# Patient Record
Sex: Female | Born: 1935 | Race: White | Hispanic: No | Smoking: Former smoker
Health system: Southern US, Community
[De-identification: ages and names within clinical notes are randomized; demographics above are authoritative.]

## PROBLEM LIST (undated history)

## (undated) DIAGNOSIS — C659 Malignant neoplasm of unspecified renal pelvis: Secondary | ICD-10-CM

## (undated) DIAGNOSIS — F3289 Other specified depressive episodes: Secondary | ICD-10-CM

## (undated) DIAGNOSIS — F411 Generalized anxiety disorder: Secondary | ICD-10-CM

## (undated) DIAGNOSIS — Z794 Long term (current) use of insulin: Secondary | ICD-10-CM

## (undated) DIAGNOSIS — M810 Age-related osteoporosis without current pathological fracture: Secondary | ICD-10-CM

## (undated) DIAGNOSIS — H3530 Unspecified macular degeneration: Secondary | ICD-10-CM

## (undated) DIAGNOSIS — K449 Diaphragmatic hernia without obstruction or gangrene: Secondary | ICD-10-CM

## (undated) DIAGNOSIS — I6990 Unspecified sequelae of unspecified cerebrovascular disease: Secondary | ICD-10-CM

## (undated) DIAGNOSIS — R269 Unspecified abnormalities of gait and mobility: Secondary | ICD-10-CM

## (undated) DIAGNOSIS — F329 Major depressive disorder, single episode, unspecified: Secondary | ICD-10-CM

## (undated) DIAGNOSIS — M1990 Unspecified osteoarthritis, unspecified site: Secondary | ICD-10-CM

## (undated) DIAGNOSIS — D649 Anemia, unspecified: Secondary | ICD-10-CM

## (undated) DIAGNOSIS — M316 Other giant cell arteritis: Secondary | ICD-10-CM

## (undated) DIAGNOSIS — N3020 Other chronic cystitis without hematuria: Secondary | ICD-10-CM

## (undated) DIAGNOSIS — F070 Personality change due to known physiological condition: Secondary | ICD-10-CM

## (undated) DIAGNOSIS — I509 Heart failure, unspecified: Secondary | ICD-10-CM

## (undated) DIAGNOSIS — D3000 Benign neoplasm of unspecified kidney: Secondary | ICD-10-CM

## (undated) DIAGNOSIS — B351 Tinea unguium: Secondary | ICD-10-CM

## (undated) DIAGNOSIS — K661 Hemoperitoneum: Secondary | ICD-10-CM

## (undated) DIAGNOSIS — B0229 Other postherpetic nervous system involvement: Secondary | ICD-10-CM

## (undated) DIAGNOSIS — I10 Essential (primary) hypertension: Secondary | ICD-10-CM

## (undated) DIAGNOSIS — IMO0002 Reserved for concepts with insufficient information to code with codable children: Secondary | ICD-10-CM

## (undated) DIAGNOSIS — E119 Type 2 diabetes mellitus without complications: Secondary | ICD-10-CM

## (undated) DIAGNOSIS — Z9889 Other specified postprocedural states: Secondary | ICD-10-CM

## (undated) DIAGNOSIS — IMO0001 Reserved for inherently not codable concepts without codable children: Secondary | ICD-10-CM

## (undated) DIAGNOSIS — D693 Immune thrombocytopenic purpura: Secondary | ICD-10-CM

## (undated) DIAGNOSIS — R350 Frequency of micturition: Secondary | ICD-10-CM

## (undated) DIAGNOSIS — J449 Chronic obstructive pulmonary disease, unspecified: Secondary | ICD-10-CM

## (undated) DIAGNOSIS — E1142 Type 2 diabetes mellitus with diabetic polyneuropathy: Secondary | ICD-10-CM

## (undated) DIAGNOSIS — E785 Hyperlipidemia, unspecified: Secondary | ICD-10-CM

## (undated) DIAGNOSIS — E1065 Type 1 diabetes mellitus with hyperglycemia: Secondary | ICD-10-CM

## (undated) DIAGNOSIS — R112 Nausea with vomiting, unspecified: Secondary | ICD-10-CM

## (undated) DIAGNOSIS — Z8673 Personal history of transient ischemic attack (TIA), and cerebral infarction without residual deficits: Secondary | ICD-10-CM

## (undated) DIAGNOSIS — D708 Other neutropenia: Secondary | ICD-10-CM

## (undated) HISTORY — DX: Frequency of micturition: R35.0

## (undated) HISTORY — DX: Generalized anxiety disorder: F41.1

## (undated) HISTORY — DX: Age-related osteoporosis without current pathological fracture: M81.0

## (undated) HISTORY — DX: Other postherpetic nervous system involvement: B02.29

## (undated) HISTORY — DX: Personality change due to known physiological condition: F07.0

## (undated) HISTORY — DX: Unspecified abnormalities of gait and mobility: R26.9

## (undated) HISTORY — DX: Tinea unguium: B35.1

## (undated) HISTORY — DX: Chronic obstructive pulmonary disease, unspecified: J44.9

## (undated) HISTORY — DX: Type 1 diabetes mellitus with hyperglycemia: E10.65

## (undated) HISTORY — DX: Anemia, unspecified: D64.9

## (undated) HISTORY — PX: TOTAL KNEE ARTHROPLASTY: SHX125

## (undated) HISTORY — DX: Hyperlipidemia, unspecified: E78.5

## (undated) HISTORY — DX: Essential (primary) hypertension: I10

## (undated) HISTORY — DX: Reserved for concepts with insufficient information to code with codable children: IMO0002

## (undated) HISTORY — DX: Major depressive disorder, single episode, unspecified: F32.9

## (undated) HISTORY — PX: TEMPORAL ARTERY BIOPSY / LIGATION: SUR132

## (undated) HISTORY — DX: Immune thrombocytopenic purpura: D69.3

## (undated) HISTORY — DX: Malignant neoplasm of unspecified renal pelvis: C65.9

## (undated) HISTORY — DX: Hemoperitoneum: K66.1

## (undated) HISTORY — PX: ELBOW SURGERY: SHX618

## (undated) HISTORY — DX: Unspecified sequelae of unspecified cerebrovascular disease: I69.90

## (undated) HISTORY — DX: Heart failure, unspecified: I50.9

## (undated) HISTORY — DX: Diaphragmatic hernia without obstruction or gangrene: K44.9

## (undated) HISTORY — DX: Other neutropenia: D70.8

## (undated) HISTORY — PX: CARPAL TUNNEL RELEASE: SHX101

## (undated) HISTORY — DX: Other specified depressive episodes: F32.89

---

## 1990-12-06 HISTORY — PX: CATARACT EXTRACTION W/ INTRAOCULAR LENS  IMPLANT, BILATERAL: SHX1307

## 1991-03-08 HISTORY — PX: CATARACT EXTRACTION W/ INTRAOCULAR LENS IMPLANT: SHX1309

## 1997-06-10 ENCOUNTER — Ambulatory Visit (HOSPITAL_COMMUNITY): Admission: RE | Admit: 1997-06-10 | Discharge: 1997-06-10 | Payer: Self-pay | Admitting: Neurosurgery

## 1997-07-29 ENCOUNTER — Ambulatory Visit (HOSPITAL_COMMUNITY): Admission: RE | Admit: 1997-07-29 | Discharge: 1997-07-29 | Payer: Self-pay | Admitting: Neurosurgery

## 1997-08-12 ENCOUNTER — Ambulatory Visit (HOSPITAL_COMMUNITY): Admission: RE | Admit: 1997-08-12 | Discharge: 1997-08-13 | Payer: Self-pay | Admitting: Neurosurgery

## 1997-08-26 ENCOUNTER — Ambulatory Visit (HOSPITAL_COMMUNITY): Admission: RE | Admit: 1997-08-26 | Discharge: 1997-08-26 | Payer: Self-pay | Admitting: Neurosurgery

## 1997-11-05 ENCOUNTER — Emergency Department (HOSPITAL_COMMUNITY): Admission: EM | Admit: 1997-11-05 | Discharge: 1997-11-05 | Payer: Self-pay

## 1999-02-17 ENCOUNTER — Other Ambulatory Visit: Admission: RE | Admit: 1999-02-17 | Discharge: 1999-02-17 | Payer: Self-pay | Admitting: Internal Medicine

## 2000-08-29 ENCOUNTER — Encounter (INDEPENDENT_AMBULATORY_CARE_PROVIDER_SITE_OTHER): Payer: Self-pay | Admitting: Specialist

## 2000-08-29 ENCOUNTER — Encounter: Payer: Self-pay | Admitting: Emergency Medicine

## 2000-08-29 ENCOUNTER — Inpatient Hospital Stay (HOSPITAL_COMMUNITY): Admission: EM | Admit: 2000-08-29 | Discharge: 2000-09-08 | Payer: Self-pay | Admitting: Emergency Medicine

## 2003-05-16 ENCOUNTER — Ambulatory Visit (HOSPITAL_COMMUNITY): Admission: RE | Admit: 2003-05-16 | Discharge: 2003-05-16 | Payer: Self-pay | Admitting: Orthopaedic Surgery

## 2003-06-18 ENCOUNTER — Ambulatory Visit (HOSPITAL_COMMUNITY): Admission: RE | Admit: 2003-06-18 | Discharge: 2003-06-19 | Payer: Self-pay | Admitting: Orthopaedic Surgery

## 2003-06-18 HISTORY — PX: OTHER SURGICAL HISTORY: SHX169

## 2003-11-19 ENCOUNTER — Observation Stay (HOSPITAL_COMMUNITY): Admission: RE | Admit: 2003-11-19 | Discharge: 2003-11-21 | Payer: Self-pay | Admitting: Orthopaedic Surgery

## 2003-11-19 HISTORY — PX: LUMBAR LAMINECTOMY: SHX95

## 2004-01-23 ENCOUNTER — Inpatient Hospital Stay (HOSPITAL_COMMUNITY): Admission: RE | Admit: 2004-01-23 | Discharge: 2004-01-26 | Payer: Self-pay | Admitting: Orthopaedic Surgery

## 2004-03-09 ENCOUNTER — Ambulatory Visit: Payer: Self-pay | Admitting: Oncology

## 2004-08-12 LAB — HM DEXA SCAN

## 2005-01-22 HISTORY — PX: TOTAL HIP ARTHROPLASTY: SHX124

## 2005-03-10 ENCOUNTER — Ambulatory Visit: Payer: Self-pay | Admitting: Oncology

## 2005-10-10 ENCOUNTER — Ambulatory Visit (HOSPITAL_COMMUNITY): Admission: RE | Admit: 2005-10-10 | Discharge: 2005-10-10 | Payer: Self-pay | Admitting: Orthopaedic Surgery

## 2005-10-12 ENCOUNTER — Ambulatory Visit (HOSPITAL_COMMUNITY): Admission: RE | Admit: 2005-10-12 | Discharge: 2005-10-13 | Payer: Self-pay | Admitting: Orthopaedic Surgery

## 2005-10-12 HISTORY — PX: ANTERIOR CERVICAL DECOMP/DISCECTOMY FUSION: SHX1161

## 2006-02-06 ENCOUNTER — Encounter: Admission: RE | Admit: 2006-02-06 | Discharge: 2006-02-06 | Payer: Self-pay | Admitting: Orthopaedic Surgery

## 2006-02-10 HISTORY — PX: POSTERIOR FUSION CERVICAL SPINE: SUR628

## 2006-02-20 ENCOUNTER — Ambulatory Visit (HOSPITAL_COMMUNITY): Admission: RE | Admit: 2006-02-20 | Discharge: 2006-02-21 | Payer: Self-pay | Admitting: Orthopaedic Surgery

## 2006-03-06 ENCOUNTER — Ambulatory Visit: Payer: Self-pay | Admitting: Oncology

## 2006-04-04 LAB — MORPHOLOGY: PLT EST: ADEQUATE

## 2006-04-04 LAB — CBC WITH DIFFERENTIAL/PLATELET
BASO%: 0.7 % (ref 0.0–2.0)
Basophils Absolute: 0.1 10*3/uL (ref 0.0–0.1)
EOS%: 3.5 % (ref 0.0–7.0)
HGB: 12.2 g/dL (ref 11.6–15.9)
MCH: 32.3 pg (ref 26.0–34.0)
MCHC: 32.5 g/dL (ref 32.0–36.0)
MONO#: 0.7 10*3/uL (ref 0.1–0.9)
RDW: 12.2 % (ref 11.3–14.5)
WBC: 8.8 10*3/uL (ref 3.9–10.0)
lymph#: 2.1 10*3/uL (ref 0.9–3.3)

## 2006-06-20 ENCOUNTER — Emergency Department (HOSPITAL_COMMUNITY): Admission: EM | Admit: 2006-06-20 | Discharge: 2006-06-20 | Payer: Self-pay | Admitting: *Deleted

## 2006-06-22 ENCOUNTER — Observation Stay (HOSPITAL_COMMUNITY): Admission: EM | Admit: 2006-06-22 | Discharge: 2006-06-24 | Payer: Self-pay | Admitting: *Deleted

## 2007-08-08 ENCOUNTER — Inpatient Hospital Stay (HOSPITAL_COMMUNITY): Admission: RE | Admit: 2007-08-08 | Discharge: 2007-08-13 | Payer: Self-pay | Admitting: Orthopaedic Surgery

## 2007-08-13 ENCOUNTER — Ambulatory Visit: Payer: Self-pay | Admitting: Surgery

## 2007-08-13 ENCOUNTER — Encounter (INDEPENDENT_AMBULATORY_CARE_PROVIDER_SITE_OTHER): Payer: Self-pay | Admitting: Orthopaedic Surgery

## 2007-12-27 ENCOUNTER — Encounter: Admission: RE | Admit: 2007-12-27 | Discharge: 2007-12-27 | Payer: Self-pay | Admitting: Orthopaedic Surgery

## 2008-07-12 ENCOUNTER — Inpatient Hospital Stay (HOSPITAL_COMMUNITY): Admission: EM | Admit: 2008-07-12 | Discharge: 2008-07-15 | Payer: Self-pay | Admitting: Emergency Medicine

## 2008-07-12 ENCOUNTER — Ambulatory Visit: Payer: Self-pay | Admitting: Internal Medicine

## 2008-07-15 ENCOUNTER — Encounter (INDEPENDENT_AMBULATORY_CARE_PROVIDER_SITE_OTHER): Payer: Self-pay | Admitting: Internal Medicine

## 2009-01-24 ENCOUNTER — Encounter (INDEPENDENT_AMBULATORY_CARE_PROVIDER_SITE_OTHER): Payer: Self-pay | Admitting: Emergency Medicine

## 2009-01-24 ENCOUNTER — Emergency Department (HOSPITAL_COMMUNITY): Admission: EM | Admit: 2009-01-24 | Discharge: 2009-01-24 | Payer: Self-pay | Admitting: Emergency Medicine

## 2009-01-24 ENCOUNTER — Ambulatory Visit: Payer: Self-pay | Admitting: Vascular Surgery

## 2009-02-12 ENCOUNTER — Encounter: Admission: RE | Admit: 2009-02-12 | Discharge: 2009-02-12 | Payer: Self-pay | Admitting: Internal Medicine

## 2009-04-21 ENCOUNTER — Emergency Department (HOSPITAL_COMMUNITY): Admission: EM | Admit: 2009-04-21 | Discharge: 2009-04-21 | Payer: Self-pay | Admitting: Emergency Medicine

## 2009-05-06 ENCOUNTER — Inpatient Hospital Stay (HOSPITAL_COMMUNITY): Admission: RE | Admit: 2009-05-06 | Discharge: 2009-05-08 | Payer: Self-pay | Admitting: Orthopaedic Surgery

## 2010-03-28 ENCOUNTER — Encounter: Payer: Self-pay | Admitting: Internal Medicine

## 2010-05-26 LAB — URINE MICROSCOPIC-ADD ON

## 2010-05-26 LAB — PROTIME-INR: Prothrombin Time: 13.3 seconds (ref 11.6–15.2)

## 2010-05-26 LAB — URINALYSIS, ROUTINE W REFLEX MICROSCOPIC
Bilirubin Urine: NEGATIVE
Hgb urine dipstick: NEGATIVE
Hgb urine dipstick: NEGATIVE
Nitrite: POSITIVE — AB
Protein, ur: NEGATIVE mg/dL
Specific Gravity, Urine: 1.006 (ref 1.005–1.030)
Specific Gravity, Urine: 1.021 (ref 1.005–1.030)
Urobilinogen, UA: 0.2 mg/dL (ref 0.0–1.0)

## 2010-05-26 LAB — CBC
HCT: 34.4 % — ABNORMAL LOW (ref 36.0–46.0)
MCHC: 33.3 g/dL (ref 30.0–36.0)
MCV: 93.4 fL (ref 78.0–100.0)
Platelets: 216 10*3/uL (ref 150–400)
RDW: 14.8 % (ref 11.5–15.5)
WBC: 6.3 10*3/uL (ref 4.0–10.5)

## 2010-05-26 LAB — COMPREHENSIVE METABOLIC PANEL
ALT: 31 U/L (ref 0–35)
AST: 35 U/L (ref 0–37)
Albumin: 3.5 g/dL (ref 3.5–5.2)
BUN: 17 mg/dL (ref 6–23)
CO2: 26 mEq/L (ref 19–32)
Calcium: 10.3 mg/dL (ref 8.4–10.5)
Chloride: 102 mEq/L (ref 96–112)
Creatinine, Ser: 0.75 mg/dL (ref 0.4–1.2)
Creatinine, Ser: 0.83 mg/dL (ref 0.4–1.2)
GFR calc Af Amer: >60 mL/min (ref >60)
GFR calc non Af Amer: >60 mL/min (ref >60)
Glucose, Bld: 194 mg/dL — ABNORMAL HIGH (ref 70–99)
Glucose, Bld: 194 mg/dL — ABNORMAL HIGH (ref 70–99)
Sodium: 137 mEq/L (ref 135–145)
Total Bilirubin: 0.9 mg/dL (ref 0.3–1.2)
Total Protein: 6.8 g/dL (ref 6.0–8.3)

## 2010-05-26 LAB — POCT CARDIAC MARKERS
CKMB, poc: 1.1 ng/mL (ref 1.0–8.0)
Myoglobin, poc: 98.5 ng/mL (ref 12–200)

## 2010-05-26 LAB — DIFFERENTIAL
Basophils Absolute: 0 10*3/uL (ref 0.0–0.1)
Lymphocytes Relative: 21 % (ref 12–46)
Neutro Abs: 4.3 10*3/uL (ref 1.7–7.7)
Neutrophils Relative %: 68 % (ref 43–77)

## 2010-05-26 LAB — LIPASE, BLOOD: Lipase: 17 U/L (ref 11–59)

## 2010-05-30 LAB — BASIC METABOLIC PANEL
BUN: 12 mg/dL (ref 6–23)
BUN: 16 mg/dL (ref 6–23)
Chloride: 102 mEq/L (ref 96–112)
Chloride: 99 mEq/L (ref 96–112)
Creatinine, Ser: 0.69 mg/dL (ref 0.4–1.2)
Glucose, Bld: 185 mg/dL — ABNORMAL HIGH (ref 70–99)
Glucose, Bld: 211 mg/dL — ABNORMAL HIGH (ref 70–99)
Potassium: 3.5 mEq/L (ref 3.5–5.1)
Potassium: 4.6 mEq/L (ref 3.5–5.1)

## 2010-05-30 LAB — CBC
HCT: 26 % — ABNORMAL LOW (ref 36.0–46.0)
HCT: 26.8 % — ABNORMAL LOW (ref 36.0–46.0)
MCHC: 33.3 g/dL (ref 30.0–36.0)
MCHC: 33.5 g/dL (ref 30.0–36.0)
MCV: 94.6 fL (ref 78.0–100.0)
MCV: 94.7 fL (ref 78.0–100.0)
Platelets: 140 10*3/uL — ABNORMAL LOW (ref 150–400)
Platelets: 150 10*3/uL (ref 150–400)
RDW: 14.9 % (ref 11.5–15.5)
RDW: 15.5 % (ref 11.5–15.5)

## 2010-05-30 LAB — PROTIME-INR: Prothrombin Time: 17.7 seconds — ABNORMAL HIGH (ref 11.6–15.2)

## 2010-05-30 LAB — GLUCOSE, CAPILLARY
Glucose-Capillary: 134 mg/dL — ABNORMAL HIGH (ref 70–99)
Glucose-Capillary: 213 mg/dL — ABNORMAL HIGH (ref 70–99)
Glucose-Capillary: 217 mg/dL — ABNORMAL HIGH (ref 70–99)
Glucose-Capillary: 98 mg/dL (ref 70–99)

## 2010-06-15 LAB — HEMOGLOBIN A1C
Hgb A1c MFr Bld: 6.7 % — ABNORMAL HIGH (ref 4.6–6.1)
Mean Plasma Glucose: 146 mg/dL

## 2010-06-15 LAB — DIFFERENTIAL
Basophils Absolute: 0.2 10*3/uL — ABNORMAL HIGH (ref 0.0–0.1)
Basophils Relative: 2 % — ABNORMAL HIGH (ref 0–1)
Eosinophils Absolute: 0.3 K/uL (ref 0.0–0.7)
Eosinophils Relative: 3 % (ref 0–5)
Lymphocytes Relative: 23 % (ref 12–46)
Lymphs Abs: 1.8 K/uL (ref 0.7–4.0)
Monocytes Absolute: 0.7 10*3/uL (ref 0.1–1.0)
Monocytes Relative: 9 % (ref 3–12)
Neutro Abs: 4.9 10*3/uL (ref 1.7–7.7)
Neutrophils Relative %: 63 % (ref 43–77)

## 2010-06-15 LAB — URINALYSIS, ROUTINE W REFLEX MICROSCOPIC
Bilirubin Urine: NEGATIVE
Glucose, UA: NEGATIVE mg/dL
Hgb urine dipstick: NEGATIVE
Ketones, ur: NEGATIVE mg/dL
Nitrite: NEGATIVE
Protein, ur: NEGATIVE mg/dL
Specific Gravity, Urine: 1.012 (ref 1.005–1.030)
Urobilinogen, UA: 1 mg/dL (ref 0.0–1.0)
pH: 6 (ref 5.0–8.0)

## 2010-06-15 LAB — GLUCOSE, CAPILLARY
Glucose-Capillary: 111 mg/dL — ABNORMAL HIGH (ref 70–99)
Glucose-Capillary: 149 mg/dL — ABNORMAL HIGH (ref 70–99)
Glucose-Capillary: 175 mg/dL — ABNORMAL HIGH (ref 70–99)
Glucose-Capillary: 211 mg/dL — ABNORMAL HIGH (ref 70–99)
Glucose-Capillary: 229 mg/dL — ABNORMAL HIGH (ref 70–99)
Glucose-Capillary: 271 mg/dL — ABNORMAL HIGH (ref 70–99)

## 2010-06-15 LAB — COMPREHENSIVE METABOLIC PANEL WITH GFR
ALT: 21 U/L (ref 0–35)
AST: 29 U/L (ref 0–37)
Albumin: 3.5 g/dL (ref 3.5–5.2)
BUN: 35 mg/dL — ABNORMAL HIGH (ref 6–23)
Chloride: 105 meq/L (ref 96–112)
Creatinine, Ser: 1.19 mg/dL (ref 0.4–1.2)
GFR calc non Af Amer: 44 mL/min — ABNORMAL LOW (ref >60)
Glucose, Bld: 194 mg/dL — ABNORMAL HIGH (ref 70–99)
Total Protein: 6.8 g/dL (ref 6.0–8.3)

## 2010-06-15 LAB — CBC
HCT: 37.2 % (ref 36.0–46.0)
Hemoglobin: 12.8 g/dL (ref 12.0–15.0)
MCHC: 34.1 g/dL (ref 30.0–36.0)
MCHC: 34.5 g/dL (ref 30.0–36.0)
MCV: 94 fL (ref 78.0–100.0)
MCV: 94.5 fL (ref 78.0–100.0)
Platelets: 214 10*3/uL (ref 150–400)
Platelets: 265 K/uL (ref 150–400)
RBC: 3.93 MIL/uL (ref 3.87–5.11)
RDW: 15.6 % — ABNORMAL HIGH (ref 11.5–15.5)
RDW: 15.7 % — ABNORMAL HIGH (ref 11.5–15.5)
WBC: 7.8 10*3/uL (ref 4.0–10.5)

## 2010-06-15 LAB — BASIC METABOLIC PANEL
BUN: 21 mg/dL (ref 6–23)
BUN: 24 mg/dL — ABNORMAL HIGH (ref 6–23)
BUN: 33 mg/dL — ABNORMAL HIGH (ref 6–23)
CO2: 25 mEq/L (ref 19–32)
Calcium: 8.5 mg/dL (ref 8.4–10.5)
Calcium: 9 mg/dL (ref 8.4–10.5)
Chloride: 106 mEq/L (ref 96–112)
Chloride: 113 mEq/L — ABNORMAL HIGH (ref 96–112)
Creatinine, Ser: 0.73 mg/dL (ref 0.4–1.2)
Creatinine, Ser: 0.76 mg/dL (ref 0.4–1.2)
Creatinine, Ser: 0.87 mg/dL (ref 0.4–1.2)
GFR calc Af Amer: >60 mL/min (ref >60)
GFR calc non Af Amer: >60 mL/min (ref >60)
Glucose, Bld: 222 mg/dL — ABNORMAL HIGH (ref 70–99)
Glucose, Bld: 229 mg/dL — ABNORMAL HIGH (ref 70–99)
Potassium: 3.9 mEq/L (ref 3.5–5.1)

## 2010-06-15 LAB — COMPREHENSIVE METABOLIC PANEL
Alkaline Phosphatase: 80 U/L (ref 39–117)
CO2: 25 mEq/L (ref 19–32)
Calcium: 9.4 mg/dL (ref 8.4–10.5)
GFR calc Af Amer: 53 mL/min — ABNORMAL LOW (ref >60)
Potassium: 5 mEq/L (ref 3.5–5.1)
Sodium: 137 mEq/L (ref 135–145)
Total Bilirubin: 0.6 mg/dL (ref 0.3–1.2)

## 2010-06-15 LAB — CORTISOL: Cortisol, Plasma: 10.8 ug/dL

## 2010-06-15 LAB — CLOSTRIDIUM DIFFICILE EIA: C difficile Toxins A+B, EIA: NEGATIVE

## 2010-06-15 LAB — SEDIMENTATION RATE: Sed Rate: 45 mm/hr — ABNORMAL HIGH (ref 0–22)

## 2010-06-15 LAB — OVA AND PARASITE EXAMINATION

## 2010-06-15 LAB — TROPONIN I: Troponin I: 0.02 ng/mL (ref 0.00–0.06)

## 2010-06-15 LAB — STOOL CULTURE

## 2010-06-15 LAB — BRAIN NATRIURETIC PEPTIDE: Pro B Natriuretic peptide (BNP): <30 pg/mL (ref 0.0–100.0)

## 2010-07-20 NOTE — Op Note (Signed)
NAMEADLER, ALTON               ACCOUNT NO.:  [id number]   MEDICAL RECORD NO.:  [id number]          PATIENT TYPE:  INP   LOCATION:  5009                         FACILITY:  [facility]   PHYSICIAN:  [name]. Ophelia Charter, M.D.    DATE OF BIRTH:  [birth year]   DATE OF PROCEDURE:  08/08/2007  DATE OF DISCHARGE:                               OPERATIVE REPORT   PREOPERATIVE DIAGNOSIS:  Left knee osteoarthritis.   POSTOPERATIVE DIAGNOSIS:  Left knee osteoarthritis.   PROCEDURE:  Left total knee arthroplasty.  Cemented, computer assisted   SURGEON:  [name]. Ophelia Charter, MD   ASSISTANT:  Wende Neighbors, PA-C   ANESTHESIA:  GOT, Marcaine skin local.   TOURNIQUET TIME:  1 hour.   DRAINS:  None.   COMPONENTS USED:  DePuy J and J rotating platform, PFC #3 femur with  plugs, 12.5 bearing with post, #3 cemented tibial tray, and 35-mm  patella.   PROCEDURE:  After induction of general anesthesia via orotracheal  intubation, the patient was prepped and draped with the proximal thigh  tourniquet after Foley catheter was placed.  Used impervious  stockinette, Coban, split sheets, drapes, sterile skin marker, and  Betadine drape x2 was used to seal the skin.   Time-out checklist was completed.  Leg was wrapped in Esmarch,  tourniquet inflated, and a midline incision was made.  Medial  retinacular incision was made splitting the quad tendon between the  medial and lateral two thirds.  Patella was flipped over and cut from  facet, removing 9.5-10 mm of bone.  Computer initialization was  performed.  After resection of marginal osteophytes, there was severe  tricompartmental degenerative changes with grade 4 chondromalacia  changes.  Computer initiation was performed with generation of the  femoral and tibial model size #3.  The patient had a 5-degree flexion  contracture and was in no significant varus-valgus malalignment.  A 10  mm were removed off the femoral side.  Chamfer cuts and box cuts were  made.   A #3 trial sizer is appropriate for the tibia and 9-10 mm removed  off the tibia.  Keel cuts were made, trials were tried, and 12.5 gave  better collateral balance in extension and prevented hyperextension.  After pulsatile lavage, tibia was cemented followed by femoral component  and then patella.  A 12.5 poly was inserted and 35-mm patella are  cemented.  All excessive cement had been removed.  After cement was  hard, tourniquet was deflated, and hemostasis obtained.  Layer closure  #1 Tycron  in the deep fascia, 2-0 Vicryl subcutaneous tissue and subcuticular skin  closure.  Marcaine infiltration, Steri-Strips, postop dressing, and knee  immobilizer.  Subcu reapproximation for the tibial bicortical pin was  used for computer initiation.  Steri-Strip application.  Transferred to  the recovery room in stable condition.      [name]. Ophelia Charter, M.D.  Electronically Signed     MCY/MEDQ  D:  08/08/2007  T:  08/09/2007  Job:  [id number]

## 2010-07-20 NOTE — H&P (Signed)
[name], [name]               ACCOUNT NO.:  [id number]   MEDICAL RECORD NO.:  [id number]          PATIENT TYPE:  EMS   LOCATION:  ED                           FACILITY:  [facility]   PHYSICIAN:  Pedro Earls, MD     DATE OF BIRTH:  April 19, 1924   DATE OF ADMISSION:  07/12/2008  DATE OF DISCHARGE:                              HISTORY & PHYSICAL   PRIMARY CARE PHYSICIAN:  Dr. Chilton Si.   CHIEF COMPLAINT:  Weakness and fall.   HISTORY OF PRESENT ILLNESS:  This is an 77 year old white female patient  who lives at home with his son who was brought in after an episode of  weakness and fall.  According to the patient, she had been having  weakness for almost the past 6 months, when she wakes up with the severe  pain and weakness in her bilateral upper extremities, which are swollen  and weak, up to the point that more recently 2 weeks ago the patient had  fallen and since then patient's condition had deteriorated to the extent  that she has not been able to feed herself and has not been eating for  the past few days.  The patient also had seen Dr. Hyacinth Meeker a couple days  ago when her some of her medications were stopped. Since then patient's  condition apparently got deteriorated and the patient had come to the ER  for further evaluation and management.   REVIEW OF SYSTEMS:  As above.  Rest of review of systems are negative.   PAST MEDICAL HISTORY:  1. Hypertension.  2. Degenerative joint disease.  3. Polymyalgia rheumatic.  4. Depression.  5. Diabetes type 2.  6. Osteoporosis.   PAST SURGICAL HISTORY:  1. Back surgery.  2. Carpal tunnel release.  3. Hip replacement knee replacement.   SOCIAL HISTORY:  Nonsmoker, nonalcoholic.  No IV drug abuse.  Lives at  home with her son.   FAMILY HISTORY:  Noncontributory.   ALLERGIES:  CODEINE, MORPHINE.   CURRENT MEDICATIONS:  1. Actonel 1 tablet q. Monthly.  2. Calcium carbonate 60 mg p.o. b.i.d.  3. Enalapril 10 mg daily.  4.  Multivitamin 1 tablet daily.  5. Prednisone 4 mg daily.  6. Gabapentin 10 mg t.i.d.  7. Acyclovir 20 grams daily.  8. Lorazepam 0.5 mg 1/2 tablet at breakfast and lunch and 0.5 mg 1      tablet q.h.s.   PHYSICAL EXAMINATION:  VITAL SIGNS:  VITALS:  Temperature 97.8,  respirations 16, pulse is 80 to 90, blood pressure is 115-130/70's,  pulse ox 96% on room air.  GENERAL:  Patient awake, alert, oriented x3.  Does not appear to be in  distress.  HEENT:  Pupils equal, round and reactive to light. There is no pallor.  Extraocular muscles intact.  NECK:  Supple.  No JVD.  CARDIOVASCULAR:  S1 and S2 regular. No murmurs, heaves, or gallops.  CHEST:  Clear.  ABDOMEN:  Soft, nontender.  Bowel sounds present.  EXTREMITIES:  No clubbing sinus days cyanosis, edema.  NEUROLOGIC:  Sensorimotor grossly intact.  SKIN:  No rashes.  MUSCULOSKELETAL:  Unremarkable.   LABORATORY DATA:  EKG showed normal sinus rhythm.  No acute ST-T wave  changes were seen.  No change from previous EKG done May 2009.   Cortisol level is 10.8.  UA was negative.  potassium 5.0, creatinine  1.19. Hemoglobin and hematocrit is 12.8 and 37.2.   CT scan of the spine showed severe compression fracture at T12.  Chest x-  ray showed some mild left lower lobe scarring   IMPRESSION:  1. Dehydration.  2. Status post fall.  3. T12 compression fracture.  4. Polymyalgia rheumatica.  5. Hypertension.  6. Steroid dependency.  7. Diabetes mellitus type 2.   PLAN:  Admit to med surgery. IV fluids. OT and PT evaluations. Pain  control with Dilaudid and Tylenol. Will use steroids as well for 2 days.  Use insulin sliding scale coverage. Check hemoglobin A1C. Accu-Checks  a.c. and h.s.      Pedro Earls, MD  Electronically Signed     NS/MEDQ  D:  07/12/2008  T:  07/12/2008  Job:  [id number]

## 2010-07-20 NOTE — Discharge Summary (Signed)
[name], [name]               ACCOUNT NO.:  [id number]   MEDICAL RECORD NO.:  [id number]          PATIENT TYPE:  INP   LOCATION:  1316                         FACILITY:  [facility]   PHYSICIAN:  [name] Scott, MD     DATE OF BIRTH:  25-Jan-1925   DATE OF ADMISSION:  07/12/2008  DATE OF DISCHARGE:  07/15/2008                               DISCHARGE SUMMARY   PRIMARY MEDICAL DOCTOR:  Lenon Curt. Chilton Si, M.D.   RHEUMATOLOGIST:  Areatha Keas, M.D.   DISCHARGE DIAGNOSES:  1. Dehydration, resolved.  2. Uncontrolled type 2 diabetes mellitus.  3. Fall.  4. Failure to thrive.  5. Polymyalgia rheumatica on chronic prednisone.  6. Hypertension.  7. T12 fracture.  8. ? Dementia.  9. Osteoporosis.  10.History of depression.   DISCHARGE MEDICATIONS:  1. Actonel 1 tablet p.o. q. monthly.  2. Calcium carbonate 600 mg p.o. b.i.d.  3. Enalapril 10 mg p.o. daily.  4. Claritin 10 mg p.o. daily.  5. Multivitamins 1 p.o. daily.  6. Prednisone 4 mg p.o. daily.  7. Gabapentin 300 mg p.o. t.i.d.  8. Acyclovir 200 mg p.o. daily.  9. Lorazepam 0.5 mg tablet, half tablet p.o. at breakfast and lunch;      and 1 tablet at bedtime.   PROCEDURES:  1. CT of the thoracic spine impression:  Moderately severe compression      fracture of T12 that appears chronic.  There is posterior      osteophyte at T12-L1 causing spinal stenosis.  MRI was recommended      if the patient had pain or recent trauma.  2. Chest x-ray impression:  Right left lower lobe scarring.  Severe      compression fracture of approximately T12 which was not previously      present.  This could be an acute fracture.  There was mild      retropulsion into the canal.  CT was suggested to evaluate fracture      and the spinal canal.  This could be benign or pathologic fracture.   LABORATORY DATA:  Stool culture is pending.  Basic metabolic panel today  with BUN 21, creatinine 0.76, C.  difficile toxin negative, ESR 45, TSH  0.417,  hemoglobin A1c 6.7.  CBC with hemoglobin 12, hematocrit 35.2,  white blood cell 6.7, platelets 214.  Random cortisol was 10.8.  Urinalysis was negative.  Troponin x1 was negative.  Hepatic panel was  unremarkable.  BNP less than 30.   CONSULTATIONS:  None.   HOSPITAL COURSE/PATIENT DISPOSITION:  Please refer to the history and  physical note for initial admission details.  In summary, Ms. Seebeck is  a pleasant 77 year old Caucasian female patient with history of  polymyalgia rheumatica on chronic steroids, hypertension, type 2  diabetes, diet-controlled, osteoporosis and degenerative joint disease  who lives with her son at home and was brought to the ED with history of  weakness and fall.  The patient gives history of weakness for almost 6  months when she wakes up with pain and weakness in both upper  extremities to a point where  2-1/2 weeks prior to this admission, she  indicated that she fell in the bathtub.  The patient denies loss of  consciousness, chest pain, palpitations, dyspnea, dizziness or  lightheadedness.  She has chronic back pain and denies any worsening of  her back pain.  She reached a point where she had difficulty feeding  herself and had not been eating well for a few days.  She was then  brought to the ED for further evaluation and management.  1. Dehydration, probably secondary to poor oral intake and general      failure to thrive.  The patient was hydrated with IV fluids. The      patient has been able to feed by herself and currently the      dehydration has resolved.  2. Uncontrolled type 2 diabetes mellitus.  The patient was briefly      placed on IV hydrocortisone with increasing her CBGs in the 250-300      range.  With stoppage of her hydrocortisone, her CBGs are back in      the 111-211 range.  Consider starting a low-dose of on oral      hypoglycemic as an outpatient as deemed necessary.  3. Fall.  The patient was evaluated by physical therapy and  recommend      home health PT which will be arranged.  We will also get an echo to      ensure normal EF.  4. Failure to thrive from advanced age and multiple medical problems.      The patient will have to be closely monitored as an outpatient.  5. Polymyalgia rheumatica.  We will continue the patient's chronic      prednisone.  Mildly elevated ESR, but the patient denies any      headache, body aches, visual symptoms since admission.  Consider      repeating an ESR or adjusting her prednisone dose as an outpatient.  6. Hypertension, controlled.  7. T12 fracture on Radiology: However, the patient denies any      worsening pain.  She is able to ambulate without any significant      pain and there are no focal neurological deficits.  This is      probably secondary to her osteoporosis.  Consider outpatient      evaluation for the fracture and osteoporosis as deemed necessary.  8. ? element of mild dementia.   At this time, we will follow up on the echo.  If it is okay, then the  patient is stable for discharge home.  Recommend follow up with her  primary medical doctor in the next week's time.   Time taken in coordinating this discharge is 25 minutes.      [name] Scott, MD  Electronically Signed     AH/MEDQ  D:  07/15/2008  T:  07/15/2008  Job:  [id number]   cc:   Lenon Curt. Chilton Si, M.D.  Fax: [phone]   Areatha Keas, M.D.  Fax: [phone]

## 2010-07-23 NOTE — Op Note (Signed)
NAME:  [name], [name]                         ACCOUNT NO.:  [id number]   MEDICAL RECORD NO.:  [id number]                   PATIENT TYPE:  OIB   LOCATION:  5019                                 FACILITY:  [facility]   PHYSICIAN:  [name]. Ophelia Charter, M.D.                 DATE OF BIRTH:  Apr 23, 1924   DATE OF PROCEDURE:  06/18/2003  DATE OF DISCHARGE:  06/19/2003                                 OPERATIVE REPORT   PREOPERATIVE DIAGNOSIS:  Displaced volar Barton's distal radius fracture.   POSTOPERATIVE DIAGNOSIS:  Displaced volar Barton's distal radial fracture.   PROCEDURE:  Open reduction and internal fixation, volar compression plate  fixation, right distal radius.   SURGEON:  [name]. Ophelia Charter, M.D.   ASSISTANT:  Nita Sells, P.A.-C.   ANESTHESIA:  GOT.   TOURNIQUET TIME:  Approximately 30 minutes.   DESCRIPTION OF PROCEDURE:  After the induction of general anesthesia,  orotracheal intubation, preoperative Ancef prophylaxis.  The arm was prepped  with DuraPrep and the usual stockinette.  Extremity sheaths and drapes were  applied.  Sterile skin marker was used and Betadine and Vi-Drape were used  to seal the skin.  Incision was made adjacent to the radial artery.  The  radial artery was taken laterally towards the radial aspect and the SCR  radial artery was used for dissection.  Pronator was cut off of its  insertion and peeled back to expose the distal radius fracture site with the  volar displaced fragment.  A hand innovation plate was selected and short  plate was selected.  It was fashioned, checked under fluoroscopy and a  single bicortical screw was placed and tightened down.  The position looked  good.  A second screw was placed more proximally and then distally three  screws were placed after drilling which was cancellous with the volar  buttress plate.  The volar distal radius fragment and piece of the radius  was in excellent position, anatomic, AP and lateral.  A pin was placed in  the lunate depressed fragment.  After irrigation with saline solution, the  tourniquet was deflated and subcutaneous tissue reapproximated with 3-0  Vicryl.  Skin closure with 4-0 nylon was used.  Dorsal splint and  transferred to the recovery room in stable condition.                                               [name]. Ophelia Charter, M.D.    MCY/MEDQ  D:  06/18/2003  T:  06/19/2003  Job:  [id number]

## 2010-07-23 NOTE — H&P (Signed)
[facility]  Patient:    BIRDENA, KINGMA                      MRN: [id number] Adm. Date:  [id number] Attending:  Shelba Flake CC:         Genene Churn. Cyndie Chime, M.D.  Lenon Curt Cassell Clement, M.D.   History and Physical  ID:  This is a 77 year old woman, a patient of Dr. Chilton Si.  CHIEF COMPLAINT:  Pain in her legs, rash, and thrombocytopenia.  HISTORY OF PRESENT ILLNESS:  The patient was in usual state until yesterday and noted burning pain in her feet and a few spots on her feet.  Over the course of the day, the rash spread and moved up her legs, and then up into her abdomen.  Pain in her feet has decreased.  The patient denies fever, chills, cough, melena, hematochezia, hematuria, or headaches.  States she had frequency about a week ago, but took no antibiotics and had no hematuria.  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  History of peptic ulcer disease, history of herniated lumbar disk.  MEDICATIONS:  No recent changes per the patient and include trazodone 50 q.h.s., Zyrtec 10, Elavil 10, Arthrotec 75, Neurontin 300, glyburide 2.5, as well as two herbal preparations, both of which she is unsure the name of.  SOCIAL HISTORY:  She lives at home.  She is a nonsmoker and nondrinker.  She had a normal mammogram a week ago.  FAMILY HISTORY:  Positive for CA including a father who died of lung cancer. Mother died of breast cancer.  Daughter died of lung cancer.  REVIEW OF SYSTEMS:  GENERAL:  Feels well, no weight loss.  No obvious fevers. HEENT:  No headache, no blurred vision.  RESPIRATORY:  No cough, no sputum. CARDIOVASCULAR:  No chest pain.  GI:  No nausea, no vomiting, and no bleeding.  PHYSICAL EXAMINATION:  GENERAL:  The patient has an obvious petechial rash on her legs and  torso. However, she does not look ________.  VITAL SIGNS:  Latest blood pressure is 142/75, pulse 76, respirations 21, and temperature is 101.  HEENT:  Fundi are normal.  Mouth showed few scattered petechiae.  NECK:  Normal.  LYMPHS:  None.  BREASTS:  No masses.  RESPIRATORY EXAMINATION:  Clear.  CARDIOVASCULAR:  Heart sounds are normal.  ABDOMEN:  No liver and no spleen and no masses.  RECTAL:  Showed a scant amount of OB negative stool.  GU:  No obvious bleeding, normal external female genitalia.  EXTREMITIES:  No active synovitis is noted.  Probable mild osteoarthritis in her knees.  Peripheral pulses are intact.  CNS EXAMINATION:  Nonfocal.  Reflexes are symmetric.  Toes are downgoing.  MENTAL STATUS:  The patient is alert, oriented, and responsive, and able to give her own history.  SKIN:  Wide spread petechial rash upper legs, and up into her abdomen, in the folds in the groin, and a few scattered petechiae in her mouth.  LABORATORY DATA:   Laboratory work to date shows a normal basic metabolic panel, in particular a normal BUN and creatinine.  AST is 42, ALT is 51, alkaline phosphatase is normal, glucose 159.  White count is 2.9 with 26% neutrophils, 57% lymphocytes, hemoglobin 12.7 with normal MCV, normal MCHC, and a normal RDW.  Platelets  are less than 5000.  Absolute ________ count is 0.8.  Chest x-ray is clear.  PT and PTT are normal.  IMPRESSION: 1. Severe thrombocytopenia.  In the setting with neutropenia, I think this    would idiopathic thrombocytopenic purpura very unlikely.  I think we are    looking at some form of marrow suppression/infiltrative process.  Included    in this differential wound include invasive tumors, multiple myeloma,    myelodysplastic syndrome, toxin exposure, or medication-induced suppression    in this group include glyburide and Neurontin.  She also could very well    have an infectious cause, a viral or certain bacteria could  probably do    this. She has no palpable spleen. 2. Hypertension.  She is immune compromised.  I will start her on empiric    antibiotics after drawing blood cultures.  Will use Fortaz 1 g IV ____,    she has no allergies.  Plan - I have asked for Dr. Sofie Hartigan review of    this situation.  She will likely need platelet transfusion, although she is    not actively bleeding.  She will be started on empiric antibiotics, and we    have requested a private room.  I think it is quite likely she will need to    have a bone marrow aspirate and biopsy done in the morning, and I have    asked Dr. Cyndie Chime to see her for this reason. 3. Type 2 diabetes mellitus.  I have taken her off the glyburide as a possible    bone marrow suppression agent, and we will substitute insulin sliding scale    with loose control for now.  PLAN:  The patient is admitted for evaluation of her absolute neutropenia and thrombocytopenia.  Empiric antibiotics have been begun.  Cultures will be done and a hematology consult requested. DD:  08/29/00 TD:  08/30/00 Job: [id number]

## 2010-07-23 NOTE — Discharge Summary (Signed)
[name], [name]               ACCOUNT NO.:  [id number]   MEDICAL RECORD NO.:  [id number]          PATIENT TYPE:  INP   LOCATION:  1344                         FACILITY:  [facility]   PHYSICIAN:  Beckey Rutter, MD  DATE OF BIRTH:  January 23, 1925   DATE OF ADMISSION:  06/21/2006  DATE OF DISCHARGE:  06/24/2006                               DISCHARGE SUMMARY   CHIEF COMPLAINT:  Generalized body pains.   HISTORY OF PRESENT ILLNESS:  77 year old Caucasian female came in with  severe body aches and pain together with nausea.  The patient required  an IV steroid for that.   HOSPITAL COURSE:  Problem 1:  Generalized body aches/myalgia.  The patient has a known  history of polymyalgia rheumatica and she was on chronic steroids for  that, 2 mg daily.  Since the presenting symptoms is secondary to the  polymyalgia rheumatica or it could be simply because of low cortisol  level in the blood.  The blood cortisol which was checked randomly, the  8 a.m. blood draw is 4.6 which is within normal range, but is still on  the lower side of the normal range for a.m. draw.  During  hospitalization, the patient received Solu-Medrol 60 mg four times a day  with improvement of her symptoms.  Also, she improved with symptoms of  nausea, as well.  Now, she is feeling better, overall, and she is  feeling more active and energetic, as well.  I suspect the steroid dose  is too little for her symptoms and today, I am going to discharge her  with increased dose of steroids at 10 mg, which needs to be adjusted  further by the primary physician.   Problem 2:  Diabetes remained stable during hospitalization, though she  has some high CBG numbers which is likely secondary to the high IV  steroid dose that the patient was receiving.  Now, she is going off of  the steroid dose that is causing the slight elevation on the CBG  reading, probably will go to baseline prior to admission.  That said,  her hemoglobin A1c  was 7.7 which is not optimal control for diabetes and  the recommendation for the patient is to follow up with her primary  physician for further adjustment of anti-diabetic medication.  We will  discharge her today with 2.5 mg of Glyburide, that is the same dose the  patient was taking prior to hospitalization.   Problem 3:  Hypertension remained stable during hospitalization.   Problem 4:  History of bilateral carpal tunnel syndrome was stable  during hospitalization, as well.   Problem 5:  Shingles.  Today, the patient is stable for discharge, but  she was complaining eruption on the posterior aspect of her right side.  The patient had this eruption which was diagnosed as shingles before in  the same dermatome distribution and it seems like she is having shingles  again just getting started which is likely especially since she has been  on a high dose of steroids during this hospitalization.  With the  patient being taken off  the high dose of steroids and continued on  maintenance dose, I expect the disruption will improve.  I will  prescribe Valtrex for the shingles early in the course starting today to  continue for seven days.   PAST MEDICAL HISTORY:  1. Polymyalgia rheumatica.  2. Shingles.  3. Hypertension.  4. Diabetes.  5. Bilateral carpal tunnel syndrome.  6. History of thrombocytopenia.   DISCHARGE MEDICATIONS:  1. Actonel.  2. Amitriptyline 25 mg p.o. q.h.s.  3. Calcium carbonate.  4. Claritin 10 mg daily.  5. Detrol.  6. Enalapril.  7. Glyburide 2.5 mg p.o. daily.  8. Multi-vitamin.  9. K-Dur.  10.Prednisone 10 mg p.o. daily and the dose is increased from      admission.  The patient is recommended follow with her primary for      further adjustment of the prednisone dose.  11.Tramadol/acetaminophen p.r.n.  12.Vicodin p.r.n.  13.Valtrex 1000 mg p.o. q.8h. for seven days.   HOSPITAL PROCEDURES:  The patient had chest x-ray on the admission date  with the  impression of no acute disease.  The patient also had CT scan  on April 16, the admission date, with the reading showing no acute  intracranial abnormalities, chronic small vessel ischemia.  The patient  had left knee x-ray, impression reading no acute finding with  degenerative disease and chondrocalcinosis noted.   DISCHARGE LABS:  White blood cell count 5.6, hemoglobin 11.2, hematocrit  32.9, platelets 198.  Her sodium is 138, potassium 4, chloride 106,  bicarb 27, glucose 120, BUN 17, creatinine 0.72.  Hemoglobin A1c 7.7.  C-  reactive protein 1.5.  Total cholesterol 101, triglycerides 66,  cholesterol HDL 33, LDL 55.  The random cholesterol taken at 8 a.m. was  4.6 with the normal range for a.m. 4.3 to 22.4.  TSH is 1.47.   DISCHARGE PLAN:  As discussed above, the dose of steroids was increased  from 2 mg to 10 mg.  The patient was encouraged to follow up with her  primary for further assessment and medication adjustment.  The patient  is aware of the plan and she is stable for discharge today.      Beckey Rutter, MD  Electronically Signed     EME/MEDQ  D:  06/24/2006  T:  06/24/2006  Job:  [id number]

## 2010-07-23 NOTE — Op Note (Signed)
NAME:  [name], BURMASTER                         ACCOUNT NO.:  [id number]   MEDICAL RECORD NO.:  [id number]                   PATIENT TYPE:  INP   LOCATION:  NA                                   FACILITY:  [facility]   PHYSICIAN:  [name]. Ophelia Charter, M.D.                 DATE OF BIRTH:  07/27/1924   DATE OF PROCEDURE:  11/19/2003  DATE OF DISCHARGE:                                 OPERATIVE REPORT   PREOPERATIVE DIAGNOSIS:  Right L3-4 foraminal stenosis.   POSTOPERATIVE DIAGNOSIS:  Right L3-4 foraminal stenosis.   PROCEDURE:  Right L3, partial L4 laminotomy with foraminotomy.   SURGEON:  [name]. Ophelia Charter, M.D.   ASSISTANT:  Sandrea Matte, P.A.   ANESTHESIA:  GOT.   ESTIMATED BLOOD LOSS:  100 mL.   DRAINS:  None.   PROCEDURE:  After induction of general anesthesia and orotracheal  intubation, the patient received preoperative Ancef and was placed in the  kneeling position on the [place] frame.  The patient has had previous spinal  cord stimulator, which apparently became unplugged, had to be revised, and  then later had to be transferred and was placed anteriorly in her abdomen.  This incision was starting at the L3 level and extended proximal.  The  patient's back was prepped with Duraprep.  The area was squared with towels,  Betadine Vi-Drape applied, and a needle was placed at the expected level of  L3-4.  A crosstable lateral x-ray was taken, which showed the needle was  just above the L3-4 disk space.  The incision was started at the level of  the needle and extended distally.  Subperiosteal dissection the lamina, and  the Taylor retractor was placed laterally.  There was extremely tight canal  with very narrow distance between the right and left pedicle.  There was  considerable facet overgrowth, and it took some time to perform a laminotomy  with continued bone removal until almost a complete hemilaminectomy had been  performed on the right side, leaving a small shelf of bone  proximally.  The  bone was removed out to the level of the pedicle, and there were extremely  hypertrophic thick chunks of ligamentum, which was removed.  The operating  microscope was used for the dissection after being draped and was used as  soon as the chunks of ligament were started to be removed.  At one point in  the procedure at the inferior aspect of the incision, there was a question  whether there was a possible dural tear.  Inspection did not reveal any  continued fluids.  Search was made with no defect in the dura.  Nerve root  was tight, and there were some anterior bone spurs present but no extruded  disk fragment.  Thick chunks of the ligament were removed from the lateral  gutter and the lamina was enlarged.  Proximally there were dense adhesions  from  the previous spinal cord stimulator with the nerve stuck along the  dorsum and also right lateral gutter as well as anteriorly.  This was  partially released until the top portion of L3, which as well above the disk  and areas above the area of compression by the MRI scan.  Reinspection for  the area where there was a question of a possible dural tear did not reveal  any continued spinal fluid.  There was some mild  epidural bleeding.  After irrigation with saline solution, fascia was closed  with 0 Vicryl sutures, 2-0 Vicryl in the subcutaneous tissue, and skin  staple closure.  Instrument count and needle count was correct.  The patient  tolerated the procedure well and was transferred to the recovery room in  stable condition.                                               [name]. Ophelia Charter, M.D.    MCY/MEDQ  D:  11/19/2003  T:  11/19/2003  Job:  [id number]

## 2010-07-23 NOTE — Op Note (Signed)
[name], [name]               ACCOUNT NO.:  [id number]   MEDICAL RECORD NO.:  [id number]          PATIENT TYPE:  INP   LOCATION:  5013                         FACILITY:  [facility]   PHYSICIAN:  [name]. Ophelia Charter, M.D.    DATE OF BIRTH:  1925/01/29   DATE OF PROCEDURE:  01/22/2005  DATE OF DISCHARGE:  01/26/2004                                 OPERATIVE REPORT   PREOPERATIVE DIAGNOSIS:  Left hip osteoarthritis.   POSTOPERATIVE DIAGNOSIS:  Left hip osteoarthritis.   PROCEDURE:  Left total hip arthroplasty.   SURGEON:  Annell Greening, M.D.   ASSISTANT:  Sandrea Matte, P.A.C.   ANESTHESIA:  GOT.   EBL:  250 mL.   DRAINS:  None.   POSTOP CONDITION:  Stable.   COMPONENTS USED:  1.  Trident TSL [employer]on[employer] acetabular shell, 32 mm, 10 degree,      polyethylene C-taper +0 neck, 12 mm universal distal cement, Osteonics      Ion plus cemented hip stand #6 size.  2.  [employer] cement.  3.  Acetabular shell was 50E series.   PROCEDURE:  After induction of general anesthesia, the patient was placed in  the lateral position, prepped with Ancef prophylaxis, standard prepping and  draping.  Sterile skin marker, Betadine and Vi-Drape x2 sealing the lateral  hip, buttocks and second one for the groin.  An appropriate stockinette and  Coban had been applied.  Posterior approach was made.  Gluteus maximus was  split in line with its fibers, piriformis tacked for later repair and then  cut.  Posterior capsule was incised, a large Steinmann pin is placed  laterally in the pelvis underneath the gluteus medius and one in the greater  trochanter with the leg parallel to the opposite leg and measured prior to  cutting the neck.  This measurement was used for later restoration of leg  length after the prosthesis was placed.  The head was removed, femur was  prepared for sequential reaming and broaching for the #6 cemented stem.  A  sponge was placed after irrigation, acetabulum was prepared with  sequential  reaming up to the appropriate size for 50E shell no-hole design.  Trials  were inserted, excellent stability, flexion to 90, abduction 15 degrees,  internal rotation 80 degrees with good stability.  A 32 mm ball was  selected.  After irrigation, final touch reaming and insertion of the final  prosthesis on the acetabular side, permanent poly popped in, cement was  mixed and femur was cemented into place, identical findings of stability  with the +0 neck.  After irrigation, piriformis was repaired, tensor fascia  and gluteus  maximus repaired, nonabsorbable 0 Vicryl in the gluteus maximus muscle,  reapproximation 2-0 Vicryl subcutaneous tissue, skin staple closure.  Instrument count, needle count was correct.  Patient was transferred to the  recovery room in stable condition, had intact sciatic nerve function.      MCY/MEDQ  D:  05/14/2004  T:  05/16/2004  Job:  [id number]

## 2010-07-23 NOTE — Discharge Summary (Signed)
[name], [name]               ACCOUNT NO.:  [id number]   MEDICAL RECORD NO.:  [id number]          PATIENT TYPE:  INP   LOCATION:  5013                         FACILITY:  [facility]   PHYSICIAN:  [name]. Ophelia Charter, M.D.    DATE OF BIRTH:  Jul 24, 1924   DATE OF ADMISSION:  01/23/2004  DATE OF DISCHARGE:  01/26/2004                                 DISCHARGE SUMMARY   PRINCIPAL DIAGNOSIS:  Left hip osteoarthritis.   PROCEDURE:  Left total hip arthroplasty.   ADDITIONAL DIAGNOSES:  1.  Thrombocytopenia.  2.  Diabetes type 2.  3.  Hypertension.  4.  Osteoporosis.   REASON FOR ADMISSION:  This 77 year old female has had progressive left hip  osteoarthritis with bone on bone changes.  She has been admitted in the past  for low platelets.   CURRENT ADMISSION MEDICATIONS:  1.  Neurontin 300 mg t.i.d.  2.  Glyburide 2.5 mg one half tablet daily.  3.  Amitriptyline 25 mg daily.  4.  Tylenol Arthritis.  5.  Fl examine.  6.  Centrum Silver daily.  7.  Enalapril 5 mg p.o. daily.   HOSPITAL COURSE:  The patient was admitted and after informed consent she  underwent a left total hip arthroplasty on January 23, 2004 under general  anesthesia with 10 mL local added in the skin incision.  Estimated blood  loss was 250 mL.  The patient had a press fit acetabulum and a cemented  femur.  Postoperative hemoglobin was 9.6.  Glucose was 133.  The patient had  some nausea.  She progressed from IV pain medication to oral pain  medication.  She was seen by pharmacy for deep venous thrombosis  prophylaxis.  The Foley was removed on postoperative day two.  She made  satisfactory progress and was ready for discharge on January 26, 2004.  Hemoglobin was 8.5 and stable. INR was 2.1 on 2.5 mg of Coumadin.  She was  taking Tylox for pain, iron 325 mg one p.o. b.i.d. with meals and Colace 100  mg b.i.d.  The dressing was changed and she was released by physical  therapy.   FINAL DIAGNOSIS:  Left hip  osteoarthritis.  Postoperative x-ray showed good  position and alignment of the hip.   DISCHARGE MEDICATIONS:  1.  Arrangements were made for outpatient Coumadin for deep venous      thrombosis prophylaxis times four weeks.       MCY/MEDQ  D:  03/22/2004  T:  03/22/2004  Job:  [id number]

## 2010-07-23 NOTE — Discharge Summary (Signed)
NAMEIDONA, STACH               ACCOUNT NO.:  [id number]   MEDICAL RECORD NO.:  [id number]          PATIENT TYPE:  INP   LOCATION:  5009                         FACILITY:  [facility]   PHYSICIAN:  [name]. Ophelia Charter, M.D.    DATE OF BIRTH:  1924-03-18   DATE OF ADMISSION:  08/08/2007  DATE OF DISCHARGE:  08/13/2007                               DISCHARGE SUMMARY   ADMISSION DIAGNOSES:  1. Left knee end-stage osteoarthritis.  2. Diabetes mellitus.  3. Polymyalgia rheumatica, steroid dependent.  4. Osteoporosis.  5. Hypertension.  6. History of transient thrombocytopenia.  7. Peripheral neuropathy.  8. Giant-cell temporal arteritis.  9. Status post left total hip replacement.  10.Status post cervical fusion C5-C6 and C6-C7.   DISCHARGE DIAGNOSES:  1. Left knee end-stage osteoarthritis.  2. Diabetes mellitus.  3. Polymyalgia rheumatica, steroid dependent.  4. Osteoporosis.  5. Hypertension.  6. History of transient thrombocytopenia.  7. Peripheral neuropathy.  8. Giant-cell temporal arteritis.  9. Status post left total hip replacement.  10.Status post cervical fusion C5-C6 and C6-C7.  11.Posthemorrhagic anemia.  12.Hypokalemia, resolved at discharge.  13.Postoperative nausea and vomiting prolonging hospitalization,      resolved at discharge.   PROCEDURES:  On August 08, 2007, the patient underwent left total knee  arthroplasty, cemented computer-assisted performed by [name],  assisted by Maud Deed, [facility], under general anesthesia.   COMPONENTS USED:  DePuy J[employer]J rotating platform PFC, #3 femur 12.5 bearing  with post, and #3 cemented tibial tray, 35-mm patella.  This was  performed under general anesthesia without complications.   CONSULTATIONS:  None.   BRIEF HISTORY:  The patient is an 77 year old female with end-stage  osteoarthritis of the left knee without relief with conservative  treatment.  She has had progressive valgus deformity and pain with  ambulation.   Radiographs show bone on bone deformity with 10-degree  valgus deformity.  It was felt she would benefit from surgical  intervention and was admitted for the procedure as stated above.   BRIEF HOSPITAL COURSE:  The patient tolerated the procedure under  general anesthesia without complications.  Postoperatively, she was  placed on Coumadin for DVT prophylaxis.  Adjustments in Coumadin dose  made according to daily Protimes.  At discharge, the patient's INR was  2.3.  The patient was treated with perioperative IV steroids as she was  noted to be steroid dependent and eventually back on her home dose prior  to discharge without difficulties.  The patient had significant  postoperative nausea and vomiting.  Multiple medications were utilized,  however, nausea and vomiting eventually relieved with Protonix IV and  Phenergan.  She was weaned from IV analgesics to p.o. analgesics  gradually.  She was able to tolerate oral analgesics prior to discharge.  The patient was started on the usual program for physical therapy for  ambulation and gait training, weightbearing as tolerated, range of  motion, strengthening, and stretching exercises of the left knee.  CPM  machine was utilized, however, caused increased pain to the patient and  she did have minimal use of the CPM machine.  She complained of  posterior calf pain in the left leg.  Doppler studies were performed and  ruled out DVT.  Dressing changes were done daily and wound was found to  be healing without erythema or drainage.  The patient's blood sugars  were mildly elevated through the hospital stay but felt to be stable.  She did have an elevated hemoglobin A1c at 7.8.  The patient's Foley  catheter was discontinued and she was able to void without difficulty.  Urinalysis on admission did show small leukocyte esterase, few  epithelial cells, 7-10 WBCs, and many bacteria.  Repeat UA on August 09, 2007 was negative for urinary tract infection  with no significant  findings with exception of 250 glucose.  The patient's hemoglobin and  hematocrit were monitored daily.  On admission, hemoglobin 13.2,  hematocrit 39.6.  Postoperatively, hemoglobin dropped to the lowest  value of 9.9 and 28.5 hematocrit.  She did not require blood  transfusion.  EKG on admission showed normal sinus rhythm.  Previous EKG  noted with no change since last tracing confirmed by Dr. Jens Som.  At  discharge, the patient was ambulating utilizing a walker.  She was doing  her range of motion exercises.  She was able to ambulate 90 feet with a  rolling walker.  CPM was utilized at 0-65 degrees during the hospital  stay.  Occupational therapy assisted with ADLs.  The patient was felt  stable for discharge home.   PLAN:  Arrangements were made for home health physical therapy and  occupational therapy.  She did not obtain a CPM machine for home, but  will do active range of motion exercises and stretching exercises.  She  is instructed to wear the knee immobilizer at all times when ambulatory  but may have it off otherwise.  She will change her dressing daily or as  needed.  She will be allowed to shower if there continues to be no  drainage from the wound.  She will continue on a diabetic diet.  She  will follow up with [name] 1 week from discharge.   DISCHARGE MEDICATIONS:  Prescriptions were given for Percocet 5/325 one  to two every 4-6 hours as needed for pain, Robaxin 500 mg one every 8  hours as needed for spasm, Reglan 10 mg one every 6 hours as needed for  nausea, Coumadin per pharmacy protocol, and over-the-counter Prilosec  daily.  She will continue home medications as taken prior to admission  and medication reconciliation form was provided with these instructions.  All questions encouraged and answered.   CONDITION ON DISCHARGE:  Stable.      Wende Neighbors, P.A.      [name]. Ophelia Charter, M.D.  Electronically Signed   SMV/MEDQ  D:   08/30/2007  T:  08/30/2007  Job:  [id number]

## 2010-07-23 NOTE — Op Note (Signed)
[name], [name]               ACCOUNT NO.:  [id number]   MEDICAL RECORD NO.:  [id number]          PATIENT TYPE:  INP   LOCATION:  NA                           FACILITY:  [facility]   PHYSICIAN:  [name]. Ophelia Charter, M.D.    DATE OF BIRTH:  Jan 02, 1925   DATE OF PROCEDURE:  10/12/2005  DATE OF DISCHARGE:                                 OPERATIVE REPORT   SURGEON:  [name]. Ophelia Charter, M.D.   ASSISTANT:  RNFA.   PREOPERATIVE DIAGNOSIS:  C5-6, C6-7 cervical spondylosis with stenosis.   POSTOPERATIVE DIAGNOSIS:  C5-6, C6-7 cervical spondylosis with stenosis.   PROCEDURE:  C5-6, C6-7 anterior cervical diskectomy and fusion with  allograft and plating (two-level fusion).   ANESTHESIA:  GOT.   ESTIMATED BLOOD LOSS:  Minimal.   DESCRIPTION OF PROCEDURE:  After induction of general anesthesia and  orotracheal intubation, and head halter traction using a horseshoe  headholder, with no traction applied during the case, except during graft  insertion.  Standard prepping and draping were performed.  Antibiotics were  given.  The area was supported with towels.  Sterile skin marker.  Betadine,  Vi-Drape applied.  Incision was made using superficial landmarks and carotid  tubercle and cricothyroid.  Platysma was divided in line with the skin  incision.  The incision was started at the midline and extended to the left.  Blunt dissection down to the longus colli was performed.  Operative level  was obtained with a cross-table lateral C spine x-ray.  Diskectomy was  performed at C5-6 and the operating microscope was draped and brought in.  A  bur was used for preparation.  Large spurs were burred off.  There were  severe stenosis and disk material, and enfolded ligamentum was removed for  decompression.  Uncovertebral joints were stripped with wire curets and dura  was completely visualized across the entire area.  Sizers were used, and a 7-  mm graft was selected.  The identical procedure was repeated at  the C6-7  level, and then the [employer] Vuelock plate was selected.  Appropriate sizes  were used and confirmed with A/P and lateral C spine C-arm.  Screws were  placed, confirmed under x-ray spot films taken.  After satisfactory locking  down of all screws, checking to make sure they were secure, operative field  was irrigated.  A Hemovac drain was placed with in-and-out technique in line  with the skin incision on the left.  The platysma was reapproximated with 3-  0 Vicryl, 4-0 Vicryl subcutaneous tissue for subcuticular closure,  postoperative dressing and Steri-Strips.  Instrument count and needle count  was correct.  The patient tolerated the procedure well and was transferred  to the recovery room in stable condition after application of a soft collar.  This was a two-level fusion.     [name]. Ophelia Charter, M.D.  Electronically Signed    MCY/MEDQ  D:  11/03/2005  T:  11/03/2005  Job:  [id number]

## 2010-07-23 NOTE — Discharge Summary (Signed)
[name], [name]               ACCOUNT NO.:  [id number]   MEDICAL RECORD NO.:  [id number]          PATIENT TYPE:  INP   LOCATION:  5013                         FACILITY:  [facility]   PHYSICIAN:  [name]. Ophelia Charter, M.D.    DATE OF BIRTH:  06/04/1924   DATE OF ADMISSION:  01/23/2004  DATE OF DISCHARGE:  01/26/2004                                 DISCHARGE SUMMARY   FINAL DIAGNOSES:  Left hip osteoarthritis.   ADDITIONAL DIAGNOSES:  1.  Thrombocytopenia.  2.  Diabetes mellitus type 2.  3.  Hypertension.   PROCEDURE:  Left total hip arthroplasty on January 23, 2004.   This 77 year old female has had progressive osteoarthritis with chronic pain  left hip not responsive to antiinflammatories with significant decreased  daily activities, difficulty ambulation and night pain.  X-rays show  degenerative spurrings at the hip joint with loss of joint space.  Marginal  osteophyte subchondral sclerosis were present.   HOSPITAL COURSE:  After admission and informed consent, the patient was  taken to the operating room and underwent left total hip arthroplasty.  Estimated blood loss was 250 mL.  Preoperative labs showed a hemoglobin of  14.3, normal PT and PTT and electrolytes were normal with a glucose of 91.   Postoperatively, the patient was seen by pharmacy for antiDVT prophylaxis,  OT consult, PT consult and care management for home care needs.  Hemoglobin  was 9.6 postop, INR of 1.4 on postop day 2.  [facility] was used for  the physical therapy postop.  The patient was discharged home with home  safety evaluation, home O2 services, home physical therapy. The patient was  ambulatory in the halls in the downstairs.  Office followup in one week.  Incision looked good. The patient was taking Tylox for pain, iron x2 weeks  and Coumadin x1 month.   CONDITION ON DISCHARGE:  Satisfactory.      MCY/MEDQ  D:  04/13/2004  T:  04/13/2004  Job:  [id number]

## 2010-07-23 NOTE — Discharge Summary (Signed)
[name], [name]               ACCOUNT NO.:  [id number]   MEDICAL RECORD NO.:  [id number]          PATIENT TYPE:  INP   LOCATION:  5013                         FACILITY:  [facility]   PHYSICIAN:  [name]. Ophelia Charter, M.D.    DATE OF BIRTH:  02/15/1925   DATE OF ADMISSION:  01/23/2004  DATE OF DISCHARGE:  01/26/2004                                 DISCHARGE SUMMARY   DICTATION DATE:  March 12, 2004.   FINAL DIAGNOSIS:  Left hip osteoarthritis.   ADDITIONAL DIAGNOSES:  1.  Diabetes type 2.  2.  Hypertension.  3.  Osteoporosis.  4.  Thrombocytopenia.   This 77 year old female is here for aggressive left hip osteoarthritis, not  responsive to conservative treatment, anti-inflammatories.  X-rays showed  degenerative spurring, acetabulum with loss of joint space.  There is  limitation of internal rotation, but no hip flexion contracture.   HOSPITAL COURSE:  Patient was admitted with preoperative labs showing a  hemoglobin of 14.3, normal PT/PTT.  Chemistry panel was normal.  Urinalysis  was clear with no evidence of UTI.  Patient was taken to the operating room  after informed consent and underwent a left total hip arthroplasty.  Patient  had a femoral __________ cemented in place, Press-Fit acetabulum without  complications.  Postoperative x-rays showed good position.  Postoperative  hemoglobin was 9.6, glucose was 132, mild nausea.  Her pain medications were  switched.  Dressing was changed.  Follow-up hemoglobin was 9.6, INR was 1.4  on postoperative day 2.  Dilaudid PCA was stopped.  She made progress with  physical therapy.  She was taking p.o. Tylox for pain.  She was seen by  PT/OT, care management.  Arrangements were made with [facility] for  therapy and blood draws for Coumadin times 4 weeks.  Dressing was changed  and was dry.  Follow-up in 2 weeks.   FINAL DIAGNOSIS:  Left hip osteoarthritis with left total hip osteoarthritis  performed on January 23, 2004.   CONDITION ON DISCHARGE:  Improved.   DISCHARGE MEDICATIONS:  1.  Iron 300 mg p.o. b.i.d. times 1 month.  2.  Coumadin times 1 month.  3.  Tylox 1-2 p.o. q.4-8 h. p.r.n. pain.     MCY/MEDQ  D:  03/12/2004  T:  03/12/2004  Job:  [id number]

## 2010-07-23 NOTE — H&P (Signed)
[name], CIOLEK               ACCOUNT NO.:  [id number]   MEDICAL RECORD NO.:  [id number]          PATIENT TYPE:  INP   LOCATION:  1344                         [facility]AC[facility]   PHYSICIAN:  [name] Scott, MD     DATE OF BIRTH:  [birth year]   DATE OF ADMISSION:  06/21/2006  DATE OF DISCHARGE:                              HISTORY & PHYSICAL   CHIEF COMPLAINT:  Generalized body pains.   HISTORY OF PRESENT ILLNESS:  [name] is a pleasant 77 year old  Caucasian female patient, with a past medical history as indicated  below.  Since her neck surgery last year, the patient has continued to  have progressively worsening pain in both her upper extremities (which  is longstanding complaint).  However the patient last night presented to  the emergency room with a history of dry heaves, but no vomiting.  She  was treated and discharged.  However, since returning home the patient  has noticed generalized body aches, especially worse in the lateral  aspect of the left knee (10/10 at the worst, and down to 5-6/10 after  she had received pain medication in the emergency room).  The patient  also complains of mild headaches.   She describes this overall generalized body pain as flu-like pains.  The  patient has been unable ambulate properly or be able to take her  medications since all this began.  Hence, the patient has presented to  the emergency room.   PAST MEDICAL HISTORY:  1. Diabetes.  2. Hypertension.  3. Bilateral carpal tunnel syndrome.  4. Polymyalgia rheumatica, diagnosed 2 years ago.  5. History of thrombocytopenia.   PAST SURGICAL HISTORY:  1. Lower back surgery.  2. Neck surgery.  3. Left total hip replacement.  4. Surgery for bilateral carpal tunnel at both elbows.   ALLERGIES:  CODEINE, MORPHINE (these seem to be intolerance ratherthan  allergies, where the patient feels sick).   MEDICATIONS:  I do not have the exact dosing in the medication list at  this time.   The patient said she provided this to the emergency room  yesterday.  1. Actonel.  2. Amitriptyline 25 mg p.o. q.h.s. p.r.n.  3. Calcium carbonate.  4. Claritin.  5. Detrol.  6. Enalapril maleate.  7. Glyburide 2.5 mg p.o. daily.  8. Lyrica; however this causes change in mental status (according to      the patient's daughters).  9. Multivitamins.  10.Potassium chloride.  11.Prednisone 2 mg p.o. daily.  She has taken a double dose today.  12.Tramadol/acetaminophen.  13.Tylenol.  14.Vicodin.   FAMILY HISTORY:  1. The patient's mother died of breast cancer.  2. The patient's dad died of lung cancer.  3. The patient's daughter died of lung cancer.  4. The patient's granddaughter has leukemia.  5. The patient's brother died of blood dyscrasia in January 29, 2006.   SOCIAL HISTORY:  The patient lives with her son.  She quit smoking in  1988.  There is no history of alcohol or drug abuse.   ADVANCE DIRECTIVES:  The patient is a FULL CODE.   REVIEW  OF SYSTEMS:  HEENT:  Patient with mild headache; no visual  symptoms.  No sore throat, earache or dysphagia.  GENERAL:  No fever or  chill, rigors or generalized weakness.  RESPIRATORY SYSTEM:  No cough or  dyspnea.  CARDIOVASCULAR:  No chest pain, palpitations, orthopnea or  PND.  ABDOMEN/GI:  With nausea; but no vomiting or abdominal pain,  constipation or diarrhea.  GENITOURINARY:  With urinary incontinence,  but no dysuria or frequency.  CENTRAL NERVOUS SYSTEM: With no asymmetric  limb weakness.  No heaviness of the tongue or mouth twisting  EXTREMITIES:  Per history of presenting illness.   PHYSICAL EXAMINATION:  GENERAL:  The patient is a moderately built and  nourished female, in mild painful distress.  VITALS:  Temperature 98 degrees Fahrenheit, blood pressure 128/71, pulse  76 per minute and regular, respirations 16, saturations 96%.  HEENT:  Nontraumatic, normocephalic.  Pupils equally reacting to light  and accommodation.   Edentulous, with no pharyngeal erythema.  NECK:  No JVD, carotid bruits, lymphadenopathy or goiter.  Supple.  RESPIRATORY SYSTEM:  Clear to auscultation bilaterally.  CARDIOVASCULAR:  First and second heart sounds heard.  No third or  fourth sounds.  No murmurs, rubs or gallops or clicks.  ABDOMEN:  Obese.  Nontender.  No organomegaly or mass.  Bowel sounds are  present.  CENTRAL NERVOUS SYSTEM:  The patient is awake, alert and oriented x3;  with no focal neurological deficits.  EXTREMITIES:  With no clubbing, cyanosis or edema.  Peripheral pulses  are symmetrically felt.  Left knee with no effusions and no warmth or  tenderness.  Minimally painful range of movements.  MUSCULOSKELETAL:  With surgical scar on the back of the neck and in the  lower back.  SKIN:  No rashes.   LAB DATA:  CBC normal.  ESR 47. Coagulation indices normal.  Basic  Metabolic Panel:  Normal.  Hepatic Panel:  Normal.  Point-of-care  cardiac markers are normal.  BNP 105.  Urinalysis:  Trace blood,  moderate leukocytes; however wbc's only 0-2.   CAT scan of the head without contrast:  No acute intracranial  abnormality.  Chronic small vessel ischemia.   Chest x-ray reveals no acute disease.   ASSESSMENT AND PLAN:  1. GENERALIZED BODY ACHES/MYALGIA.  Etiology questionable for flare of      polymyalgia rheumatica.  Will admit the patient to the hospital.      Will obtain a C-reactive protein.  Will follow up on flu antigens,      although it is probably late in the season for this.  Will provide      a single dose of IV Solu-Medrol 60 mg, and then increase her oral      steroids to 20 mg daily; monitor for response. Will place the      patient on analgesics.  2. DIABETES.  Will continue her home dose of Glyburide.  Will check      her CBGs &A1c and place her on sliding-scale insulin.  3. HYPERTENSION.  This is controlled at this time.  To place on a low     dose of Enalapril, and then readjust to home  dosages.  4. HISTORY OF BILATERAL CARPAL TUNNEL SYNDROME.  With persisting      symptoms. Continue analgesics at this time.  The patient will have      to be followed up as an outpatient by her surgeons.      [name] Scott, MD  Electronically Signed  AH/MEDQ  D:  06/22/2006  T:  06/22/2006  Job:  [id number]   cc:   Lenon Curt. Chilton Si, M.D.  Fax: [phone]   Demetria Pore. Coral Spikes, M.D.  Fax: [phone]   Areatha Keas, M.D.  Fax: [phone]

## 2010-07-23 NOTE — Discharge Summary (Signed)
[facility]  Patient:    [name], [name]                      MRN: [id number] Adm. Date:  [id number] Disc. Date: 09/08/00 Attending:  Terald Sleeper CC:         Genene Churn. Cyndie Chime, M.D.  Lenon Curt Cassell Clement, M.D.   Discharge Summary  IDENTIFICATION:  This is a 77 year old woman who was admitted through the emergency department from home with a chief complaint of pain in her legs, a rash, and low platelet counts.  HISTORY OF PRESENT ILLNESS:  The patient was in her usual state until the day before admission when she noted burning pain in her feet and a few spots on her feet.  Over the course of the day the rash spread up her legs and onto her abdomen.  Pain in her feet is decreased (the patient states she occasionally has pain in her feet which she has assumed is due to her diabetes).  The patient denied fever, chills, cough, melena, hematochezia, hematuria, or headaches.  She stated she had urinary frequency about a week ago but took no antibiotics and noted no hematuria.  She went to an urgent care center where her platelet count was found to be unmeasurable.  She was sent to the emergency room for further evaluation.  PAST MEDICAL HISTORY: 1. Temporal arteritis (not on prednisone for three years). 2. Osteoarthritis. 3. Type 2 diabetes mellitus, on glyburide, with occasional probable peripheral    neuropathy symptoms. 4. History of peptic ulcer disease. 5. History of herniated lumbar disk.  ADMISSION MEDICATIONS:  Without recent change per the patient, including: 1. Trazodone 50 q.h.s. 2. Zyrtec 10. 3. Elavil 10. 4. Arthrotec 75. 5. Neurontin 300. 6. Glyburide 2.5. 7. Two herbal preparations (Flex-A-Min and one other mineral preparation).  SOCIAL HISTORY:  She lives at home.  Nonsmoker, nondrinker.  She had a normal mammogram one week prior to admission.  FAMILY HISTORY:  Positive for cancer including a father who died of  lung cancer, mother died of breast cancer, and daughter died of lung cancer.  PHYSICAL EXAMINATION:  GENERAL:  The patient had an obvious petechial rash on her legs and torso.  VITAL SIGNS:  Blood pressure 142/75, pulse 76, respirations 21, temperature 101.  HEENT:  Fundi were normal.  Mouth showed a few scattered petechiae.  NECK:  Normal.  LYMPH:  None.  BREASTS:  No masses.  LUNGS:  Clear.  CARDIOVASCULAR:  Heart sounds were normal.  ABDOMEN:  No liver, no spleen, and no masses.  RECTAL:  Scant amount of OB-negative stool.  GU:  No obvious bleeding.  EXTREMITIES:  No active synovitis.  Probable mild OA in her knees.  Peripheral pulses were intact.  CNS:  Nonfocal.  Reflexes were symmetric.  Toes were downgoing.  NEUROLOGIC:  Alert, oriented, and responsive, and able to give her own history.  SKIN:  Widespread petechial rash on her upper legs, up into her abdomen, and in the folds of the groin, and a few scattered petechiae in her mouth.  LABORATORY DATA:  Urinalysis was essentially unremarkable.  Blood cultures showed no growth for five days.  Urine culture showed 80,000 multiple species. ANA was negative.  B12 level was 1092.  Multiple myeloma panel showed a total protein of 7.2, albumin of 69.1, beta globulin of 7.6, and a normal gamma globulin level of 11.6.  Comprehensive metabolic panel on admission showed a sodium of  144, potassium of 4, chloride 113, CO2 of 24, glucose of 159, BUN of 27, creatinine of 1, calcium of 9.4, AST of 42, ALT of 51.  Her LDH was 219. Uric acid was 4.9.  ESR was 65.  Two occult bloods were positive.  INR was 1.1, PTT was 35, PT of 13.7.  Peripheral smear initially suggested atypical lymphocytes but then subsequently was listed as morphologically unremarkable. Admission white count was 2.9, platelet count was less than 5, hemoglobin was 12.7.  At discharge her platelet count was up to 32,000, white count was 3.6, with a differential  of 43% neutrophils, 40% lymphocytes, and 16% monocytes, hemoglobin was 11.6  Radiology:  Chest x-ray:  Flattening of the hemidiaphragm suggesting the possibility of COPD.  Neural stimulator in place.  Aortic tortuosity.  Laboratories pending at the time of dictation included bone marrow aspirate and biopsy; however, this was reported as being morphologically unremarkable, with normal levels of megakaryocytes.  HOSPITAL COURSE:  This patient was referred to the emergency room after reporting to a local [facility] with a petechial rash and an unmeasurable platelet count.  The cause of this was not completely clear.  However, initially it was thought that the presence of a neutropenia with an absolute neutrophil count of less than 1000 would make peripheral destruction unlikely. It was felt initially that this was probably a bone marrow process, either suppression or a bone marrow infiltrative process.  She was kindly seen in consultation by Dr. Riley Churches.  She was given platelet transfusions; however, these did not increase her platelet count at all.  She went on to have an unremarkable bone marrow aspirate and biopsy.  There was no evidence of an infiltrative process.  When the patient did not respond to platelet transfusion it was felt possible that she had immune-mediated platelet destruction.  She was, therefore, given treatment with IgG and high-dose Solu-Medrol.  Over the course of a week to 10 days she gradually eventually responded to this with a rise of her platelet count up to 32,000 and resolution of the petechial rash.  Her white count also rose into acceptable ranges.  I should note that empirically when she arrived she was treated with intravenous Elita Quick, which was eventually discontinued when her blood cultures came back negative.  There was no evidence during the course of this hospital stay of an infectious process.   The patient did have multiple somatic  complaints during the course of hospitalization including headache.  This is a fairly common complaint for her.  It was not felt that this represented intracranial bleeding, and she remained neurologically stable.  Nor was it felt that this was secondary to any recurrence of her temporal arteritis.  With regards to her type 2 diabetes mellitus her glyburide has been discontinued, as glyburide is associated with pancytopenia.  She was managed with sliding scale during the course of hospitalization.  At discharge she will be switched to Glucophage and insulin sliding scale.  FINAL DIAGNOSES: 1. Thrombocytopenia and neutropenia (mild pancytopenia):  It is generally felt    that this was probably immune mediated although, as mentioned, this was not    at all clear at admission.  She has responded to steroids and treatment    and, therefore, she will need to remain on high-dose prednisone for at    least a month, even if her counts eventually normalize.  It was felt that    this could be followed by Dr. Cyndie Chime, and  she has a follow-up    appointment in early August. 2. Fever:  On presentation, blood cultures were negative.  There was no    evidence of an active infectious disease problem, although she was given    Elita Quick because she was somewhat immune compromised on presentation. 3. Type 2 diabetes mellitus:  Her glyburide will be discontinued.  She will be    discharged on Glucophage 850 b.i.d. with loose insulin sliding scale    coverage, for which she will receive training prior to discharge. 4. Osteoarthritis:  She will not be able to take nonsteroidals, and she was    advised of this.  DISCHARGE MEDICATIONS: 1. Prednisone 60 p.o. q.d. 2. Prilosec 20 q.d. 3. Glucophage 850 b.i.d. 4. Os-Cal 500 + D 1 p.o. b.i.d. 5. Fosamax 10 mg p.o. q.d. 6. Insulin Regular, give 5 units subcutaneous if CBG greater than 250.  Check    CBGs b.i.d. to start.  DISCHARGE INSTRUCTIONS: 1. Patient  cautioned about the possibility of injurious falls, i.e., cautioned    against putting herself in a position where she might fall. 2. Patient advised to avoid nonsteroidal medications. 3. Patient advised not to take any of her prior medications until her counts    normalize. 4. Patient discharged on a loose sliding scale, for which she will receive    training prior to discharge.  This will include giving 5 units for any CBG    above 250.  CBGs b.i.d. DD:  09/08/00 TD:  09/08/00 Job: [id number]

## 2010-07-23 NOTE — Op Note (Signed)
[name]               ACCOUNT NO.:  [id number]   MEDICAL RECORD NO.:  [id number]          PATIENT TYPE:  OIB   LOCATION:  5010                         FACILITY:  [facility]   PHYSICIAN:  [name]. Ophelia Charter, M.D.    DATE OF BIRTH:  20-Nov-1924   DATE OF PROCEDURE:  02/20/2006  DATE OF DISCHARGE:                               OPERATIVE REPORT   PREOPERATIVE DIAGNOSES:  C5-6, C6-7 pseudoarthrosis, status post  anterior cervical diskectomy, fusion and plating.   POSTOPERATIVE DIAGNOSES:  C5-6, C6-7 pseudoarthrosis, status post  anterior cervical diskectomy, fusion and plating.   PROCEDURE:  Posterior cervical fusion, C5-6, C6-7, with paraspinous  wiring, Vitoss, and iliac crest bone marrow aspirate.   SURGEON:  [name]. Ophelia Charter, M.D.   ASSISTANT:  Wende Neighbors, P.A.-C.   ANESTHESIA:  GOT, plus 8 cc Marcaine local.   ESTIMATED BLOOD LOSS:  800 mL.   DRAINS:  One Hemovac.   BRIEF HISTORY:  This 78 year old female has significant pain after  anterior cervical plating on 10/12/05.  In the last 4 months, she has had  persistent pain, and CT scan shows pseudoarthrosis at both levels.  The  patient is a past smoker.  She had used electric stimulation and was  faithful in wearing her collar immobilization postoperatively.   DESCRIPTION OF PROCEDURE:  After induction of general anesthesia,  preoperative antibiotics, time-out, the patient was placed prone, head  halter, on a horseshoe head holder, with careful padding and  positioning, checking the eyes, then reverse Trendelenburg to take some  pressure off the face and eyes.  The neck was prepped with DuraPrep, as  well as the right posterior iliac crest.  The area was squared with  towels.  A sterile skin marker was used over the spinous processes of  the neck, and Betadine Vi-Drape application in both areas.  Sterile  mouth pad, and thyroid sheet and draped.  A midline incision was made.  Subperiosteal dissection down to the  spinous processes.  She had  bleeding from muscle and  some oozing from the lamina, and a combination  of electrocautery and repetitive packing were used.  Checking the  spinous processes with the Kocher, saw that there was motion at both C6-  7 and C5-6.  C6-7 was long and more prominent than usual.  C5 was bifid.  X-ray was used for cross-table lateral, and a total of 2 or 3 pictures  was taken with incomplete visualization, despite the fact that the  patient had an anterior cervical plate at C5 to C6.  Tape had been  applied to the shoulders and the C-arm was brought in.  Oblique pictures  were taken, as well as lateral notch showing a visualization of the  plate.  With the oblique, the plate could be picked up, and wire was  triple stranded, 24 gauge, twisted under tension into a cable, hooked  underneath C7, and then around the top of C5, and then multiple pictures  were taken, trying to confirm that this was the appropriate level, using  the plate for identification, since it was difficult  to count up.  She  could be counted up from 45-degree obliques.  The dens at C2 was  visualized.  The patient had thick shoulders, weighed 92 kg, and finally  on some oblique films, it was apparent that the wire lined up well with  the plate and screws, consistent with fusion at the planned level.  At  this point, the 4-mm bur was used on the lamina posteriorly.  Again,  this started some significant bleeding.  This was packed and bleeding  muscle was coagulated with the cautery.  Vitoss 10 cc was cut in the  middle, and with the bone marrow aspirate from the right posterior iliac  crest already added and allowed to sit for 5 minutes, the Vitoss was  packed in equally on each side from C5 to C7 in the prepared bone  surface.  The wire that had been twisted was bent out of the way,  checked under fluoroscopic pictures.  A second x-ray tech came in as we  were closing and took additional pictures,  trying to confirm appropriate  levels with the wire.  Vicryl 0 was placed in the deep fashion, 2-0  Vicryl in the superficial fascia, and skin staple closure.  Marcaine  infiltration in the skin, 8 cc.  Postoperative dressing, 4 x 4's, ABD,  after Adaptic, and a soft cervical collar.  The patient tolerated the  procedure well, was transferred to the recovery room in stable  condition.      [name]. Ophelia Charter, M.D.  Electronically Signed     MCY/MEDQ  D:  02/20/2006  T:  02/21/2006  Job:  [id number]

## 2010-11-10 ENCOUNTER — Other Ambulatory Visit: Payer: Self-pay | Admitting: Internal Medicine

## 2010-11-10 DIAGNOSIS — R531 Weakness: Secondary | ICD-10-CM

## 2010-11-11 ENCOUNTER — Ambulatory Visit
Admission: RE | Admit: 2010-11-11 | Discharge: 2010-11-11 | Disposition: A | Discharge status: Home / Self Care | Payer: Medicare Other | Source: Ambulatory Visit | Attending: Internal Medicine | Admitting: Internal Medicine

## 2010-11-11 DIAGNOSIS — R531 Weakness: Secondary | ICD-10-CM

## 2010-12-01 LAB — COMPREHENSIVE METABOLIC PANEL
ALT: 43 — ABNORMAL HIGH
AST: 43 — ABNORMAL HIGH
Albumin: 3.9
Alkaline Phosphatase: 57
GFR calc Af Amer: >60
Potassium: 4.6
Sodium: 138
Total Protein: 6.8

## 2010-12-01 LAB — APTT: aPTT: 31

## 2010-12-01 LAB — URINE MICROSCOPIC-ADD ON

## 2010-12-01 LAB — URINALYSIS, ROUTINE W REFLEX MICROSCOPIC
Bilirubin Urine: NEGATIVE
Nitrite: NEGATIVE
Specific Gravity, Urine: 1.021
pH: 5.5

## 2010-12-01 LAB — DIFFERENTIAL
Basophils Relative: 0
Eosinophils Absolute: 0.1
Monocytes Absolute: 0.7
Monocytes Relative: 9
Neutro Abs: 5.7

## 2010-12-01 LAB — CBC
Platelets: 216
RDW: 13.6

## 2010-12-02 LAB — CBC
HCT: 28.5 — ABNORMAL LOW
Hemoglobin: 10.1 — ABNORMAL LOW
Hemoglobin: 10.6 — ABNORMAL LOW
Hemoglobin: 9.9 — ABNORMAL LOW
MCHC: 33.3
MCHC: 34.7
Platelets: 193
Platelets: 196
RBC: 3.07 — ABNORMAL LOW
RDW: 13.3
RDW: 13.6
RDW: 13.7
WBC: 8.9

## 2010-12-02 LAB — URINALYSIS, ROUTINE W REFLEX MICROSCOPIC
Nitrite: NEGATIVE
Protein, ur: NEGATIVE
Specific Gravity, Urine: 1.026
Urobilinogen, UA: 1

## 2010-12-02 LAB — BASIC METABOLIC PANEL
BUN: 13
CO2: 31
Calcium: 8.6
Chloride: 103
Creatinine, Ser: 0.73
Creatinine, Ser: 0.86
GFR calc Af Amer: >60
GFR calc non Af Amer: >60
Glucose, Bld: 168 — ABNORMAL HIGH
Sodium: 140

## 2010-12-02 LAB — HEMOGLOBIN A1C: Hgb A1c MFr Bld: 7.8 — ABNORMAL HIGH

## 2010-12-02 LAB — PROTIME-INR
INR: 1
INR: 1.2
INR: 1.4
INR: 2.2 — ABNORMAL HIGH
Prothrombin Time: 13.6
Prothrombin Time: 15.7 — ABNORMAL HIGH
Prothrombin Time: 25.1 — ABNORMAL HIGH

## 2010-12-02 LAB — TYPE AND SCREEN

## 2010-12-26 ENCOUNTER — Emergency Department (HOSPITAL_COMMUNITY): Payer: Medicare Other

## 2010-12-26 ENCOUNTER — Emergency Department (HOSPITAL_COMMUNITY)
Admission: EM | Admit: 2010-12-26 | Discharge: 2010-12-26 | Disposition: A | Discharge status: Home / Self Care | Payer: Medicare Other | Attending: Emergency Medicine | Admitting: Emergency Medicine

## 2010-12-26 DIAGNOSIS — Z794 Long term (current) use of insulin: Secondary | ICD-10-CM | POA: Insufficient documentation

## 2010-12-26 DIAGNOSIS — Z96659 Presence of unspecified artificial knee joint: Secondary | ICD-10-CM | POA: Insufficient documentation

## 2010-12-26 DIAGNOSIS — R05 Cough: Secondary | ICD-10-CM | POA: Insufficient documentation

## 2010-12-26 DIAGNOSIS — I10 Essential (primary) hypertension: Secondary | ICD-10-CM | POA: Insufficient documentation

## 2010-12-26 DIAGNOSIS — B009 Herpesviral infection, unspecified: Secondary | ICD-10-CM | POA: Insufficient documentation

## 2010-12-26 DIAGNOSIS — R059 Cough, unspecified: Secondary | ICD-10-CM | POA: Insufficient documentation

## 2010-12-26 DIAGNOSIS — M353 Polymyalgia rheumatica: Secondary | ICD-10-CM | POA: Insufficient documentation

## 2010-12-26 DIAGNOSIS — M129 Arthropathy, unspecified: Secondary | ICD-10-CM | POA: Insufficient documentation

## 2010-12-26 DIAGNOSIS — E119 Type 2 diabetes mellitus without complications: Secondary | ICD-10-CM | POA: Insufficient documentation

## 2010-12-26 DIAGNOSIS — F329 Major depressive disorder, single episode, unspecified: Secondary | ICD-10-CM | POA: Insufficient documentation

## 2010-12-26 DIAGNOSIS — Z79899 Other long term (current) drug therapy: Secondary | ICD-10-CM | POA: Insufficient documentation

## 2010-12-26 DIAGNOSIS — F3289 Other specified depressive episodes: Secondary | ICD-10-CM | POA: Insufficient documentation

## 2010-12-26 DIAGNOSIS — Z96649 Presence of unspecified artificial hip joint: Secondary | ICD-10-CM | POA: Insufficient documentation

## 2010-12-26 LAB — DIFFERENTIAL
Eosinophils Absolute: 0.2 10*3/uL (ref 0.0–0.7)
Eosinophils Relative: 3 % (ref 0–5)
Lymphocytes Relative: 25 % (ref 12–46)
Lymphs Abs: 1.9 10*3/uL (ref 0.7–4.0)
Monocytes Absolute: 0.7 10*3/uL (ref 0.1–1.0)

## 2010-12-26 LAB — BASIC METABOLIC PANEL
BUN: 23 mg/dL (ref 6–23)
Calcium: 9.4 mg/dL (ref 8.4–10.5)
Creatinine, Ser: 0.83 mg/dL (ref 0.50–1.10)
GFR calc Af Amer: 74 mL/min — ABNORMAL LOW (ref >90)
GFR calc non Af Amer: 63 mL/min — ABNORMAL LOW (ref >90)
Glucose, Bld: 190 mg/dL — ABNORMAL HIGH (ref 70–99)

## 2010-12-26 LAB — CBC
HCT: 34.6 % — ABNORMAL LOW (ref 36.0–46.0)
MCH: 31.9 pg (ref 26.0–34.0)
MCHC: 33.8 g/dL (ref 30.0–36.0)
MCV: 94.3 fL (ref 78.0–100.0)
Platelets: 201 10*3/uL (ref 150–400)
RDW: 14.4 % (ref 11.5–15.5)

## 2010-12-26 LAB — GLUCOSE, CAPILLARY: Glucose-Capillary: 169 mg/dL — ABNORMAL HIGH (ref 70–99)

## 2010-12-26 MED ORDER — IOHEXOL 300 MG/ML  SOLN
100.0000 mL | Freq: Once | INTRAMUSCULAR | Status: AC | PRN
  Administered 2010-12-26: 100 mL via INTRAVENOUS

## 2011-01-04 ENCOUNTER — Other Ambulatory Visit: Payer: Self-pay | Admitting: Internal Medicine

## 2011-01-05 ENCOUNTER — Other Ambulatory Visit (HOSPITAL_COMMUNITY): Payer: Self-pay | Admitting: Internal Medicine

## 2011-01-05 DIAGNOSIS — R05 Cough: Secondary | ICD-10-CM

## 2011-01-10 ENCOUNTER — Emergency Department (HOSPITAL_COMMUNITY): Payer: Medicare Other

## 2011-01-10 ENCOUNTER — Inpatient Hospital Stay (HOSPITAL_COMMUNITY)
Admission: EM | Admit: 2011-01-10 | Discharge: 2011-01-13 | DRG: 193 | Disposition: A | Discharge status: Home Health | Payer: Medicare Other | Attending: Internal Medicine | Admitting: Internal Medicine

## 2011-01-10 ENCOUNTER — Other Ambulatory Visit: Payer: Self-pay

## 2011-01-10 DIAGNOSIS — F3289 Other specified depressive episodes: Secondary | ICD-10-CM | POA: Diagnosis present

## 2011-01-10 DIAGNOSIS — R531 Weakness: Secondary | ICD-10-CM

## 2011-01-10 DIAGNOSIS — M353 Polymyalgia rheumatica: Secondary | ICD-10-CM | POA: Diagnosis present

## 2011-01-10 DIAGNOSIS — D3000 Benign neoplasm of unspecified kidney: Secondary | ICD-10-CM | POA: Diagnosis present

## 2011-01-10 DIAGNOSIS — Z96649 Presence of unspecified artificial hip joint: Secondary | ICD-10-CM

## 2011-01-10 DIAGNOSIS — G9340 Encephalopathy, unspecified: Secondary | ICD-10-CM | POA: Diagnosis present

## 2011-01-10 DIAGNOSIS — R1310 Dysphagia, unspecified: Secondary | ICD-10-CM | POA: Diagnosis present

## 2011-01-10 DIAGNOSIS — C649 Malignant neoplasm of unspecified kidney, except renal pelvis: Secondary | ICD-10-CM | POA: Diagnosis present

## 2011-01-10 DIAGNOSIS — R0902 Hypoxemia: Secondary | ICD-10-CM

## 2011-01-10 DIAGNOSIS — I10 Essential (primary) hypertension: Secondary | ICD-10-CM

## 2011-01-10 DIAGNOSIS — Z9181 History of falling: Secondary | ICD-10-CM

## 2011-01-10 DIAGNOSIS — E1129 Type 2 diabetes mellitus with other diabetic kidney complication: Secondary | ICD-10-CM

## 2011-01-10 DIAGNOSIS — E041 Nontoxic single thyroid nodule: Secondary | ICD-10-CM | POA: Diagnosis present

## 2011-01-10 DIAGNOSIS — J189 Pneumonia, unspecified organism: Principal | ICD-10-CM | POA: Diagnosis present

## 2011-01-10 DIAGNOSIS — J441 Chronic obstructive pulmonary disease with (acute) exacerbation: Secondary | ICD-10-CM | POA: Diagnosis present

## 2011-01-10 DIAGNOSIS — F329 Major depressive disorder, single episode, unspecified: Secondary | ICD-10-CM

## 2011-01-10 DIAGNOSIS — E119 Type 2 diabetes mellitus without complications: Secondary | ICD-10-CM | POA: Diagnosis present

## 2011-01-10 DIAGNOSIS — N289 Disorder of kidney and ureter, unspecified: Secondary | ICD-10-CM | POA: Diagnosis present

## 2011-01-10 HISTORY — DX: Unspecified osteoarthritis, unspecified site: M19.90

## 2011-01-10 HISTORY — DX: Essential (primary) hypertension: I10

## 2011-01-10 LAB — DIFFERENTIAL
Basophils Absolute: 0 10*3/uL (ref 0.0–0.1)
Basophils Relative: 0 % (ref 0–1)
Eosinophils Absolute: 0.2 10*3/uL (ref 0.0–0.7)
Eosinophils Relative: 3 % (ref 0–5)
Lymphocytes Relative: 21 % (ref 12–46)

## 2011-01-10 LAB — BASIC METABOLIC PANEL
CO2: 25 mEq/L (ref 19–32)
Calcium: 9.4 mg/dL (ref 8.4–10.5)
GFR calc Af Amer: 90 mL/min — ABNORMAL LOW (ref >90)
GFR calc non Af Amer: 78 mL/min — ABNORMAL LOW (ref >90)
Sodium: 134 mEq/L — ABNORMAL LOW (ref 135–145)

## 2011-01-10 LAB — CBC
MCHC: 33.7 g/dL (ref 30.0–36.0)
MCV: 95.1 fL (ref 78.0–100.0)
Platelets: 183 10*3/uL (ref 150–400)
RDW: 14.6 % (ref 11.5–15.5)
WBC: 7 10*3/uL (ref 4.0–10.5)

## 2011-01-10 LAB — URINALYSIS, ROUTINE W REFLEX MICROSCOPIC
Bilirubin Urine: NEGATIVE
Ketones, ur: >80 mg/dL — AB
Leukocytes, UA: NEGATIVE
Nitrite: POSITIVE — AB
Protein, ur: NEGATIVE mg/dL

## 2011-01-10 LAB — TROPONIN I: Troponin I: <0.3 ng/mL (ref <0.30)

## 2011-01-10 MED ORDER — IOHEXOL 300 MG/ML  SOLN
100.0000 mL | Freq: Once | INTRAMUSCULAR | Status: AC | PRN
  Administered 2011-01-10: 100 mL via INTRAVENOUS

## 2011-01-10 NOTE — ED Notes (Signed)
Patient here with general bodyaches and not feeling well with increased sleeping for several days, was evaluated a few weeks ago for cough and no diagnosis, patient pale on arrival

## 2011-01-10 NOTE — ED Notes (Signed)
Returned from xray

## 2011-01-10 NOTE — ED Notes (Signed)
Transported to CT via stretcher

## 2011-01-10 NOTE — ED Notes (Signed)
Transported to xray via stretcher.

## 2011-01-10 NOTE — ED Notes (Signed)
Hosmer, MD at bedside discussing plan of care.

## 2011-01-10 NOTE — ED Notes (Signed)
Returned from [employer] and CT scan.

## 2011-01-10 NOTE — ED Notes (Signed)
Family at bedside. 

## 2011-01-10 NOTE — ED Provider Notes (Signed)
History     CSN: [id number] Arrival date & time: 01/10/2011  6:12 PM   First MD Initiated Contact with Patient 01/10/11 1841      Chief Complaint  Patient presents with  . Generalized Body Aches    (Consider location/radiation/quality/duration/timing/severity/associated sxs/prior treatment) HPI Comments: Patient presents with increasing weakness over the last 2 days.  Per her and family she has been bad over the last 2 days.  She's had increasing cough but she declines that she has any shortness of breath.  No chest pain.  No fevers.  She has no nausea or vomiting but there is some possible diarrhea.  Per a family member they believe her stools look black today.  No bloody stools.  No prior abdominal surgeries.  Family also notes that she seems to have more difficulty with ambulation now.  She walks with a cane at baseline.  Is no specific inciting or relieving factors.  Patient has not tried any medications at home for her symptoms.  Patient is a 77 y.o. female presenting with cough. The history is provided by the patient and a relative. No language interpreter was used.  Cough This is a new problem. The current episode started yesterday. The problem has been gradually worsening. There has been no fever. Pertinent negatives include no chest pain, no chills, no headaches, no shortness of breath and no eye redness. She has tried nothing for the symptoms. She is not a smoker.    Past Medical History  Diagnosis Date  . Diabetes mellitus     Past Surgical History  Procedure Date  . Joint replacement   . Back surgery     No family history on file.  History  Substance Use Topics  . Smoking status: Never Smoker   . Smokeless tobacco: Not on file  . Alcohol Use: No    OB History    Grav Para Term Preterm Abortions TAB SAB Ect Mult Living                  Review of Systems  Constitutional: Negative for fever and chills.  HENT: Negative.   Eyes: Negative.  Negative for  discharge and redness.  Respiratory: Positive for cough. Negative for shortness of breath.   Cardiovascular: Negative.  Negative for chest pain.  Gastrointestinal: Positive for diarrhea. Negative for nausea, vomiting and abdominal pain.  Genitourinary: Negative.  Negative for dysuria and vaginal discharge.  Musculoskeletal: Negative.  Negative for back pain.  Skin: Negative.  Negative for color change and rash.  Neurological: Positive for weakness. Negative for syncope and headaches.  Hematological: Negative.  Negative for adenopathy.  Psychiatric/Behavioral: Negative.  Negative for confusion.  All other systems reviewed and are negative.    Allergies  Codeine  Home Medications   Current Outpatient Rx  Name Route Sig Dispense Refill  . ACYCLOVIR 200 MG PO CAPS Oral Take by mouth 1 day or 1 dose.     Marland Kitchen AMITRIPTYLINE HCL 50 MG PO TABS Oral Take 50 mg by mouth at bedtime.      Marland Kitchen GABAPENTIN 600 MG PO TABS Oral Take 600 mg by mouth 3 (three) times daily.      . OXYBUTYNIN CHLORIDE 5 MG PO TABS Oral Take 5 mg by mouth 3 (three) times daily.      Marland Kitchen PREDNISONE 1 MG PO TABS Oral Take 1 mg by mouth daily.      Marland Kitchen SOLIFENACIN SUCCINATE 10 MG PO TABS Oral Take 5 mg by mouth  daily.        BP 139/92  Pulse 93  Temp(Src) 98.5 F (36.9 C) (Oral)  Resp 20  SpO2 88%  Physical Exam  Constitutional: She is oriented to person, place, and time. She appears well-developed and well-nourished.  Non-toxic appearance. She does not have a sickly appearance. She is not intubated.  HENT:  Head: Normocephalic and atraumatic.  Eyes: Conjunctivae, EOM and lids are normal. Pupils are equal, round, and reactive to light. No scleral icterus.  Neck: Trachea normal and normal range of motion. Neck supple.  Cardiovascular: Normal rate, regular rhythm and normal heart sounds.   Pulmonary/Chest: Effort normal. No accessory muscle usage. No apnea and not bradypneic. She is not intubated. No respiratory distress. She  has no decreased breath sounds. She has no wheezes. She has rhonchi in the right lower field and the left lower field. She has no rales.  Abdominal: Soft. Normal appearance. There is no tenderness. There is no rebound, no guarding and no CVA tenderness.  Genitourinary: Guaiac negative stool.       Dark brown stool  Musculoskeletal: Normal range of motion.  Neurological: She is alert and oriented to person, place, and time. She has normal strength.  Skin: Skin is warm, dry and intact. No rash noted.  Psychiatric: She has a normal mood and affect. Her behavior is normal. Judgment and thought content normal.    ED Course  Procedures (including critical care time)   Labs Reviewed  CBC  DIFFERENTIAL  BASIC METABOLIC PANEL  TROPONIN I  URINALYSIS, ROUTINE W REFLEX MICROSCOPIC   No results found.   No diagnosis found.   Date: 01/10/2011  Rate: 91  Rhythm: normal sinus rhythm and premature atrial contractions (PAC)  QRS Axis: normal  Intervals: normal  ST/T Wave abnormalities: normal  Conduction Disutrbances:none  Narrative Interpretation:   Old EKG Reviewed: unchanged from 04/30/2009 except no PAC on prior EKG    MDM  Patient with symptoms of generalized weakness.  This is somewhat unclear etiology at this time.  Family reports that the patient has been having increasing falls and difficulty with balance.  She walks with a cane at baseline already.  She shows no signs of any acute trauma currently in pelvic x-ray did not demonstrate any fracture.  Patient did have a CAT scan of her brain and did not demonstrate any acute abnormalities to indicate stroke or bleed or tumor.  She did come in with initial hypoxia which is somewhat improved here but she did have findings of rhonchi in her bilateral bases worse on the left.  Given her hypoxia and without other explanation a d-dimer was ordered to consider rule out for PE as she would be otherwise low risk and it came out positive.  This led  to be ordering a CT angiogram chest which did not demonstrate pulmonary embolus but does show some findings consistent for possible infection in her lungs.  Given these findings should be given Avelox to treat for community acquired pneumonia.  Patient otherwise shows no acute abnormalities but her infection could potentially explain her generalized weakness.  I discussed this patient with the hospitalist and they will admit this patient.        Nat Christen, MD 01/11/11 [phone]

## 2011-01-10 NOTE — ED Notes (Signed)
C/o dizziness and weakness x 1 week. Family reports that she has been falling a lot. No LOC or head injury

## 2011-01-11 ENCOUNTER — Encounter (HOSPITAL_COMMUNITY): Payer: Self-pay | Admitting: Family Medicine

## 2011-01-11 DIAGNOSIS — F329 Major depressive disorder, single episode, unspecified: Secondary | ICD-10-CM

## 2011-01-11 DIAGNOSIS — I10 Essential (primary) hypertension: Secondary | ICD-10-CM

## 2011-01-11 DIAGNOSIS — E1129 Type 2 diabetes mellitus with other diabetic kidney complication: Secondary | ICD-10-CM

## 2011-01-11 DIAGNOSIS — J189 Pneumonia, unspecified organism: Secondary | ICD-10-CM | POA: Diagnosis present

## 2011-01-11 DIAGNOSIS — M353 Polymyalgia rheumatica: Secondary | ICD-10-CM

## 2011-01-11 LAB — EXPECTORATED SPUTUM ASSESSMENT W GRAM STAIN, RFLX TO RESP C

## 2011-01-11 LAB — BASIC METABOLIC PANEL
Chloride: 98 mEq/L (ref 96–112)
GFR calc Af Amer: >90 mL/min (ref >90)
GFR calc non Af Amer: 78 mL/min — ABNORMAL LOW (ref >90)
Potassium: 3.8 mEq/L (ref 3.5–5.1)
Sodium: 135 mEq/L (ref 135–145)

## 2011-01-11 LAB — URINALYSIS, ROUTINE W REFLEX MICROSCOPIC
Bilirubin Urine: NEGATIVE
Hgb urine dipstick: NEGATIVE
Ketones, ur: 40 mg/dL — AB
Nitrite: NEGATIVE
Urobilinogen, UA: 0.2 mg/dL (ref 0.0–1.0)

## 2011-01-11 LAB — GLUCOSE, CAPILLARY
Glucose-Capillary: 252 mg/dL — ABNORMAL HIGH (ref 70–99)
Glucose-Capillary: 274 mg/dL — ABNORMAL HIGH (ref 70–99)

## 2011-01-11 LAB — CBC
HCT: 36.5 % (ref 36.0–46.0)
Platelets: 175 10*3/uL (ref 150–400)
RDW: 14.7 % (ref 11.5–15.5)
WBC: 6.2 10*3/uL (ref 4.0–10.5)

## 2011-01-11 MED ORDER — GABAPENTIN 300 MG PO CAPS
600.0000 mg | ORAL_CAPSULE | Freq: Three times a day (TID) | ORAL | Status: DC
  Administered 2011-01-11 – 2011-01-13 (×7): 600 mg via ORAL
  Filled 2011-01-11 (×9): qty 2

## 2011-01-11 MED ORDER — AMITRIPTYLINE HCL 50 MG PO TABS
50.0000 mg | ORAL_TABLET | Freq: Every day | ORAL | Status: DC
  Administered 2011-01-11 – 2011-01-12 (×2): 50 mg via ORAL
  Filled 2011-01-11 (×3): qty 1

## 2011-01-11 MED ORDER — THERA M PLUS PO TABS
1.0000 | ORAL_TABLET | Freq: Every day | ORAL | Status: DC
  Administered 2011-01-11 – 2011-01-12 (×2): 1 via ORAL
  Administered 2011-01-13: 10:00:00 via ORAL
  Filled 2011-01-11 (×3): qty 1

## 2011-01-11 MED ORDER — OXYBUTYNIN CHLORIDE ER 5 MG PO TB24
5.0000 mg | ORAL_TABLET | Freq: Every day | ORAL | Status: DC
Start: 2011-01-11 — End: 2011-01-13
  Administered 2011-01-11 – 2011-01-13 (×3): 5 mg via ORAL
  Filled 2011-01-11 (×3): qty 1

## 2011-01-11 MED ORDER — IPRATROPIUM BROMIDE 0.02 % IN SOLN: 0.5000 mg | RESPIRATORY_TRACT | Status: DC | PRN

## 2011-01-11 MED ORDER — ALBUTEROL SULFATE (5 MG/ML) 0.5% IN NEBU: 2.5000 mg | INHALATION_SOLUTION | RESPIRATORY_TRACT | Status: DC | PRN

## 2011-01-11 MED ORDER — MOXIFLOXACIN HCL IN NACL 400 MG/250ML IV SOLN
400.0000 mg | Freq: Once | INTRAVENOUS | Status: AC
  Administered 2011-01-11: 400 mg via INTRAVENOUS
  Filled 2011-01-11: qty 250

## 2011-01-11 MED ORDER — INSULIN ASPART 100 UNIT/ML ~~LOC~~ SOLN
0.0000 [IU] | Freq: Three times a day (TID) | SUBCUTANEOUS | Status: DC
  Administered 2011-01-11: 3 [IU] via SUBCUTANEOUS
  Administered 2011-01-11 – 2011-01-12 (×3): 8 [IU] via SUBCUTANEOUS
  Administered 2011-01-12: 15 [IU] via SUBCUTANEOUS
  Administered 2011-01-12: 8 [IU] via SUBCUTANEOUS
  Administered 2011-01-13: 5 [IU] via SUBCUTANEOUS
  Administered 2011-01-13: 8 [IU] via SUBCUTANEOUS
  Filled 2011-01-11: qty 3

## 2011-01-11 MED ORDER — BENZONATATE 100 MG PO CAPS
100.0000 mg | ORAL_CAPSULE | Freq: Three times a day (TID) | ORAL | Status: DC | PRN
  Filled 2011-01-11: qty 1

## 2011-01-11 MED ORDER — PANTOPRAZOLE SODIUM 40 MG PO TBEC
40.0000 mg | DELAYED_RELEASE_TABLET | Freq: Every day | ORAL | Status: DC
  Administered 2011-01-11 – 2011-01-13 (×3): 40 mg via ORAL
  Filled 2011-01-11 (×4): qty 1

## 2011-01-11 MED ORDER — ENOXAPARIN SODIUM 30 MG/0.3ML ~~LOC~~ SOLN
30.0000 mg | Freq: Every day | SUBCUTANEOUS | Status: DC
  Administered 2011-01-11 – 2011-01-12 (×2): 30 mg via SUBCUTANEOUS
  Filled 2011-01-11 (×2): qty 0.3

## 2011-01-11 MED ORDER — ASPIRIN EC 325 MG PO TBEC
325.0000 mg | DELAYED_RELEASE_TABLET | Freq: Every day | ORAL | Status: DC
  Administered 2011-01-11 – 2011-01-13 (×3): 325 mg via ORAL
  Filled 2011-01-11 (×3): qty 1

## 2011-01-11 MED ORDER — METHYLPREDNISOLONE SODIUM SUCC 40 MG IJ SOLR
40.0000 mg | Freq: Two times a day (BID) | INTRAMUSCULAR | Status: DC
  Administered 2011-01-11 – 2011-01-12 (×3): 40 mg via INTRAVENOUS
  Filled 2011-01-11 (×4): qty 1

## 2011-01-11 MED ORDER — DM-GUAIFENESIN ER 30-600 MG PO TB12
1.0000 | ORAL_TABLET | Freq: Two times a day (BID) | ORAL | Status: DC
  Administered 2011-01-11 – 2011-01-13 (×5): 1 via ORAL
  Filled 2011-01-11 (×6): qty 1

## 2011-01-11 NOTE — ED Notes (Signed)
Patient's Reyne Dumas [phone] or [phone] The patient calls her grandson Gala Romney.

## 2011-01-11 NOTE — Progress Notes (Signed)
Physical Therapy Evaluation Patient Details Name: [name] MRN: [id number] DOB: 1924-12-17 Today's Date: 01/11/2011 08:55-09:25 EV2 Problem List:  Patient Active Problem List  Diagnoses  . Pneumonia  . Diabetes mellitus  . HTN (hypertension)  . Polymyalgia rheumatica  . Depression    Past Medical History:  Past Medical History  Diagnosis Date  . Diabetes mellitus   . HTN (hypertension)   . Diabetes mellitus   . Polymyalgia rheumatica   . Arthritis   . Cataract   . Osteoporosis    Past Surgical History:  Past Surgical History  Procedure Date  . Joint replacement   . Back surgery     PT Assessment/Plan/Recommendation PT Assessment Clinical Impression Statement: pt with generalized weakness and decareased activity tolerance who will need O2 and 24/7 support at home initially. Discussed HH vs ST SNF D/C options with pt and grandson PT Recommendation/Assessment: Patient will need skilled PT in the acute care venue PT Problem List: Decreased strength;Decreased activity tolerance;Decreased balance;Cardiopulmonary status limiting activity Problem List Comments: pt needs higher level of assistive device to help prevent falls Barriers to Discharge: Decreased caregiver support PT Therapy Diagnosis : Difficulty walking;Generalized weakness PT Plan PT Frequency: Min 3X/week PT Treatment/Interventions: DME instruction;Gait training;Stair training;Functional mobility training;Therapeutic exercise;Patient/family education;Balance training PT Recommendation Recommendations for Other Services: OT consult Follow Up Recommendations: Skilled nursing facility;Home health PT;24 hour supervision/assistance Equipment Recommended: None recommended by PT PT Goals  Acute Rehab PT Goals PT Goal Formulation: With patient/family Time For Goal Achievement: 2 weeks Pt will go Supine/Side to Sit: Independently PT Goal: Supine/Side to Sit - Progress: Progressing toward goal Pt will go Sit to  Supine/Side: Independently PT Goal: Sit to Supine/Side - Progress: Progressing toward goal Pt will Transfer Sit to Stand/Stand to Sit: with modified independence PT Transfer Goal: Sit to Stand/Stand to Sit - Progress: Progressing toward goal Pt will Ambulate: >150 feet;with modified independence;with least restrictive assistive device PT Goal: Ambulate - Progress: Progressing toward goal Pt will Go Up / Down Stairs: 3-5 stairs;with min assist PT Goal: Up/Down Stairs - Progress: Progressing toward goal Pt will Perform Home Exercise Program: with supervision, verbal cues required/provided PT Goal: Perform Home Exercise Program - Progress: Progressing toward goal  PT Evaluation Precautions/Restrictions  Precautions Precautions:  (falls) Precaution Comments: pt had episodes of falls at home recently Prior Functioning  Home Living Lives With: [name] (son works during the day...pt alone) Receives Help From: Family Type of Home: Apartment Home Layout: One level Home Access: Stairs to enter Entrance Stairs-Rails: Right (son helps on other side) Entrance Stairs-Number of Steps: 4 Home Adaptive Equipment: Wheelchair - [employer] - rolling;Straight cane Prior Function Level of Independence: Requires assistive device for independence Driving: Yes Vocation: Retired Financial risk analyst Arousal/Alertness: Awake/alert Overall Cognitive Status: Appears within functional limits for tasks assessed Orientation Level: Oriented X4 Sensation/Coordination   Extremity Assessment   Mobility (including Balance) Transfers Sit to Stand: 4: Min assist Sit to Stand Details (indicate cue type and reason): verbal cues to reach back with arms Stand to Sit: 4: Min assist Stand to Sit Details: verbal cues to reach back with arms Ambulation/Gait Ambulation/Gait Assistance: 4: Min assist Ambulation/Gait Assistance Details (indicate cue type and reason): cues for posture...pt tends to lean over walker with  kyphosis and generalized atrophy evident Ambulation Distance (Feet): 75 Feet Assistive device: Rolling walker Gait Pattern: Trunk flexed (pt depends on RW for stability) Gait velocity: slowed speed.  Posture/Postural Control Posture/Postural Control: Postural limitations Postural Limitations: ffixed kyphosis Static Standing Balance  Static Standing - Balance Support: Bilateral upper extremity supported Static Standing - Level of Assistance: 6: Modified independent (Device/Increase time) Exercise    End of Session PT - End of Session Equipment Utilized During Treatment: Gait belt;Other (comment) Adult nurse) Activity Tolerance: Patient limited by fatigue Patient left: in chair;with call bell in reach;with family/visitor present Nurse Communication: Mobility status for transfers;Mobility status for ambulation;Other (comment) (need for O2 during gait) General Behavior During Session: [facility] for tasks performed Cognition: [facility] for tasks performed  Donnetta Hail 01/11/2011, 9:48 AM

## 2011-01-11 NOTE — H&P (Signed)
PCP:   Dr Murray Hodgkins  Chief Complaint: weakness  HPI: This is a 77 y/o female brought to the ER for the second time this week by family. Patient has become increasingly confused for the past three days (she is alert on my interview). She has been coughing the last 2 months. She reports some wheezing. Here in the Er her Pulse ox was 88%. (She has been exposed to second hand smoke her entire marriage). Her cough is non-productive. She has been getting weaker and falling recently. She had a recent bout of diarrhea. She appetite has been poor. She has had some mild dizziness. She reports she falls because her left knee gives out. Per family she has problems holding objects, but on further questioning this is not new but has been ongoing approximately a year, she attributes it to her diabetes. She also does have bad arthritis with hip, b/l knee and other surgeries for arthritis.  She has been having shakes, no fevers, no chills. She does have recurrent UTI. She does have intermittent confusion. History provided by patient, son and husband who are at the bedside.  Review of Systems: (positives bolded) The patient denies anorexia, fever, weight loss,, vision loss, decreased hearing, hoarseness, chest pain, syncope, dyspnea on exertion, peripheral edema, balance deficits, hemoptysis, abdominal pain, melena, hematochezia, severe indigestion/heartburn, hematuria, incontinence, genital sores, muscle weakness, suspicious skin lesions, transient blindness, difficulty walking, depression, unusual weight change, abnormal bleeding, enlarged lymph nodes, angioedema, and breast masses.  Past Medical History: Past Medical History  Diagnosis Date  . Diabetes mellitus   . HTN (hypertension)   . Diabetes mellitus   . Polymyalgia rheumatica    Past Surgical History  Procedure Date  . Joint replacement   . Back surgery     Medications: Prior to Admission medications   Medication Sig Start Date End Date Taking?  Authorizing Provider  acyclovir (ZOVIRAX) 200 MG capsule Take 200 mg by mouth daily.    Yes Historical Provider, MD  amitriptyline (ELAVIL) 50 MG tablet Take 50 mg by mouth at bedtime.     Yes Historical Provider, MD  aspirin EC 325 MG tablet Take 325 mg by mouth daily.     Yes Historical Provider, MD  gabapentin (NEURONTIN) 300 MG capsule Take 600 mg by mouth 3 (three) times daily.     Yes Historical Provider, MD  Multiple Vitamins-Minerals (MULTIVITAMINS THER. W/MINERALS) TABS Take 1 tablet by mouth daily.     Yes Historical Provider, MD  nitrofurantoin, macrocrystal-monohydrate, (MACROBID) 100 MG capsule Take 100 mg by mouth 2 (two) times daily.     Yes Historical Provider, MD  oxybutynin (DITROPAN-XL) 5 MG 24 hr tablet Take 5 mg by mouth daily.     Yes Historical Provider, MD  predniSONE (DELTASONE) 1 MG tablet Take 1 mg by mouth 3 (three) times daily.    Yes Historical Provider, MD  solifenacin (VESICARE) 10 MG tablet Take 10 mg by mouth daily.    Yes Historical Provider, MD    Allergies:   Allergies  Allergen Reactions  . Codeine     Social History:  reports that she has never smoked. She does not have any smokeless tobacco history on file. She reports that she does not drink alcohol or use illicit drugs.  Family History: Family History  Problem Relation Age of Onset  . Leukemia      Physical Exam: Filed Vitals:   01/10/11 1819 01/10/11 1941 01/11/11 0000  BP: 139/92 144/73 121/68  Pulse: 93  90 87  Temp: 98.5 F (36.9 C)  98 F (36.7 C)  TempSrc: Oral  Oral  Resp: 20    SpO2: 88% 100% 97%    General:  Alert and oriented times three, well developed and nourished, no acute distress Eyes: PERRLA, pink conjunctiva, scleral icterus ENT: Moist oral mucosa, neck supple, no thyromegaly Lungs: clear to ascultation, no crackles, no use of accessory muscles, mild wheeze right posterior Cardiovascular: regular rate and rhythm, no regurgitation, no gallops, no murmurs. No  carotid bruits, no JVD Abdomen: soft, positive BS, non-tender, none distended, no organomegaly, not an acute abdomen GU: not examined Neuro: CN II - XII grossly intact, sensation intact, decreased sensation right lower extremity - mild Musculoskeletal: strength 5/5 all extremities, no clubbing, cyanosis or edema Skin: no rash, no subcutaneous crepitation, no decubitus Psych: appropriate patient   Labs on Admission:   [facility] 01/10/11 1923  NA 134*  K 4.0  CL 98  CO2 25  GLUCOSE 167*  BUN 19  CREATININE 0.71  CALCIUM 9.4  MG --  PHOS --   No results found for this basename: AST:2,ALT:2,ALKPHOS:2,BILITOT:2,PROT:2,ALBUMIN:2 in the last 72 hours No results found for this basename: LIPASE:2,AMYLASE:2 in the last 72 hours  Basename 01/10/11 1923  WBC 7.0  NEUTROABS 4.6  HGB 13.0  HCT 38.6  MCV 95.1  PLT 183    Basename 01/10/11 1910  CKTOTAL --  CKMB --  CKMBINDEX --  TROPONINI <0.30   No results found for this basename: TSH,T4TOTAL,FREET3,T3FREE,THYROIDAB in the last 72 hours No results found for this basename: VITAMINB12:2,FOLATE:2,FERRITIN:2,TIBC:2,IRON:2,RETICCTPCT:2 in the last 72 hours  Radiological Exams on Admission: Dg Chest 2 View  01/10/2011  *RADIOLOGY REPORT*  Clinical Data: Cough, hypoxia, fever  CHEST - 2 VIEW  Comparison: 12/26/2010; 07/12/2008; 10/06/2005; chest CT - 12/26/2010  Findings: Unchanged cardiac silhouette and mediastinal contours with mild tortuosity / unfolding of the thoracic aorta.  No focal parenchymal opacities.  Grossly unchanged bibasilar heterogeneous opacities, left greater than right.  likely atelectasis.  No definite pleural effusion or pneumothorax.  Unchanged bones including lower cervical spine ACDF and spinal process wire cerclage fixation, incompletely evaluated.  Redemonstrated mild focal kyphotic curvature of the lower thoracic spine.  IMPRESSION: Bibasilar atelectasis without acute cardiopulmonary disease.  Original Report  Authenticated By: Waynard Reeds, M.D.   Dg Chest 2 View  12/26/2010  *RADIOLOGY REPORT*  Clinical Data: Cough and congestion.  CHEST - 2 VIEW  Comparison: Plain films of the chest 07/12/2008 and 08/02/2007.  Findings: There is some unchanged mild basilar scarring.  Lungs otherwise appear clear.  No pneumothorax or pleural effusion. Heart size is normal.  Remote compression fracture deformity at the thoracolumbar junction is noted.  IMPRESSION: No acute disease.  Stable compared to prior exam.  Original Report Authenticated By: Bernadene Bell. Maricela Curet, M.D.   Dg Pelvis 1-2 Views  01/10/2011  *RADIOLOGY REPORT*  Clinical Data: Fall  PELVIS - 1-2 VIEW  Comparison: None.  Findings: Left total hip arthroplasty is in place.  No breakage or loosening of the hardware.  No acute fracture.  Osteopenia.  IMPRESSION: No acute bony pathology.  Left total hip arthroplasty.  Original Report Authenticated By: Donavan Burnet, M.D.   Ct Head Wo Contrast  01/10/2011  *RADIOLOGY REPORT*  Clinical Data: Dizziness, weakness.  CT HEAD WITHOUT CONTRAST  Technique:  Contiguous axial images were obtained from the base of the skull through the vertex without contrast.  Comparison: 11/11/2010  Findings: Mild prominence of the  sulci, cisterns, and ventricles, in keeping with volume loss. There are mild subcortical and periventricular white matter hypodensities, a nonspecific finding most often seen with chronic microangiopathic changes.  There is no evidence for acute hemorrhage, overt hydrocephalus, mass lesion, or abnormal extra-axial fluid collection.  No definite CT evidence for acute cortical based (large artery) infarction. Opacified left maxillary sinus, similar to prior.  Partially opacified left mastoid air cells.  Otherwise, The visualized paranasal sinuses and right mastoid air cells are predominately clear.  IMPRESSION: White matter hypodensities are most in keeping with chronic microangiopathic change, similar to prior.  No  definite acute intracranial abnormality.  Partially opacified left mastoid air cells, can be seen with mastoiditis.  Chronic left maxillary sinus disease.  Original Report Authenticated By: Waneta Martins, M.D.   Ct Angio Chest W/cm &/or Wo Cm  01/11/2011  *RADIOLOGY REPORT*  Clinical Data:  Hypoxia, elevated D-dimer.  CT ANGIOGRAPHY CHEST WITH CONTRAST  Technique:  Multidetector CT imaging of the chest was performed using the standard protocol during bolus administration of intravenous contrast.  Multiplanar CT image reconstructions including MIPs were obtained to evaluate the vascular anatomy.  Contrast: OMNIPAQUE IOHEXOL 300 MG/ML IV SOLN  Comparison:  01/10/2011 radiograph, 12/26/2010 CT  Findings:  11 mm left thyroid nodule with peripheral calcification.  There is no filling defect within the pulmonary arterial branches. Aberrant right subclavian artery courses posterior to the esophagus.  Mild ectasia/tortuosity of the aortic arch with scattered atherosclerotic calcification.  No dissection flap. Coronary artery calcification.  Mild cardiomegaly.  No pericardial or pleural effusion.  No intrathoracic lymphadenopathy.  Limited images through the upper abdomen show no acute abnormality. Lobular liver contour.  The previously described right renal lesion is not included on this examination.  Mild bibasilar scarring.  Mild lingular bronchiectasis / scarring. Mild ground-glass opacity within the upper lungs, adjacent to the major fissures.  Otherwise, no focal areas of consolidation.  No pneumothorax.  There is debris within the bilateral lower lobe bronchioles.  Otherwise, the tracheobronchial tree is patent.  Partially imaged anterior cervical fusion hardware.  No acute osseous abnormality identified.  Review of the MIP images confirms the above findings.  IMPRESSION: No pulmonary embolism identified.  Debris within bilateral lower lobe bronchial may reflect aspiration or infection.  Cardiomegaly  with coronary artery calcification.  Left thyroid lobe nodule with peripheral calcification.  Recommend non emergent ultrasound follow-up.  Original Report Authenticated By: Waneta Martins, M.D.   Ct Angio Chest W/cm &/or Wo Cm  12/26/2010  *RADIOLOGY REPORT*  Clinical Data:  Left leg pain.  Diabetes.  Hypertension.  Cough.  CT ANGIOGRAPHY OF THE CHEST  Technique:  Multidetector CT angiography of the chest was performed after contrast with bolus timed to evaluate the pulmonary arteries. Multiplanar CT image reconstructions including MIPs were obtained to evaluate the vascular anatomy.  Contrast:  100  ml Omnipaque-300  Comparison: Plain film of 12/26/2010.  Thoracic spine CT of 07/12/2008.  No prior  chest CT.  Findings: Lung windows demonstrate volume loss minimal bronchiectasis in the posterior lingula, likely due to scarring. There is also dependent left lower lobe atelectasis or scar.  Soft tissue windows:  The quality of this exam for evaluation of pulmonary embolism is good.  The bolus is well timed.  Minimal motion degradation at the lung bases. No evidence of pulmonary embolism.  Normal aortic caliber without dissection.  Tortuous descending thoracic aorta.  Moderate cardiomegaly.  An aberrant right subclavian artery, off the distal  transverse segment. Multivessel coronary artery atherosclerosis.  No pericardial or pleural effusion.  Small middle mediastinal nodes, without mediastinal or hilar adenopathy  Limited abdominal imaging demonstrates old granulomatous disease in the liver.  Moderate cirrhosis.  Mild motion degradation in the upper abdomen.  Cholelithiasis.  Upper pole left renal cyst.  An area of soft tissue fullness within the upper pole right kidney which measures 2.2 cm and  greater than fluid density on image 78. Lower cervical spine fixation.  Nonacute left tenth rib fracture on image 83.  Suspect subacute.  Moderate to severe compression deformity at T12 appears similar to 07/12/2008.   Mild canal compromise inferiorly.   Review of the MIP images confirms the above findings.  IMPRESSION:  1. No evidence of pulmonary embolism. 2.  Moderate cirrhosis. 3.  Soft tissue fullness in the upper pole right kidney.  Suspect a complex underlying lesion (either complex cyst or solid neoplasm.) If the patient is a surgical or treatment candidate, non emergent outpatient pre and post contrast abdominal CT or MRI recommended. 4.  Cardiomegaly and coronary artery atherosclerosis. 5.  A left tenth rib fracture, nonacute.  Question subacute.  6.  Cholelithiasis.  Original Report Authenticated By: Consuello Bossier, M.D.    Assessment/Plan Present on Admission:  .Pneumonia Copd with acute exacerbation -admit -cultures ordered -IV solumedrol, and antibiotics -duoneb and oxygen Generalized weakness -PT consult Encephalopathy -due to pneumonia -monitor  Polymylagia Rheumatica Arthritis Morbid obesity -stable DM -ADA diet -SSI -monitor with patient on steroids Nodule Right upper pole of kidney Moderate cirrhosis -new findings -If the patient is a surgical or treatment candidate, non emergent outpatient pre and post contrast abdominal CT or MRI recommended per radiology report Left thyroid nodule -outpatient f/u imaging recommended   Code status: Full code DVT prophylaxis   Rhonin Trott 01/11/2011, 1:20 AM

## 2011-01-11 NOTE — ED Notes (Signed)
Report called to Victorino Dike, RN on 3E.

## 2011-01-11 NOTE — Progress Notes (Signed)
I have seen and examined the patient also reviewed her laboratory data and as well as and imaging studies with  her and her family. Follow and obtain an MRI to further evaluate and right kidney lesion as patient improves clinically. Will continue current management and plan as per Dr. Joneen Roach follow  and further manage as clinically appropriate pending evolution of course. We'll consult PT OT .

## 2011-01-11 NOTE — Progress Notes (Signed)
INITIAL ADULT NUTRITION ASSESSMENT Date: 01/11/2011   Time: 11:13 AM Reason for Assessment: Health history consult  ASSESSMENT: Female 77 y.o.  Dx: Pneumonia  Hx:  Past Medical History  Diagnosis Date  . Diabetes mellitus   . HTN (hypertension)   . Diabetes mellitus   . Polymyalgia rheumatica   . Arthritis   . Cataract   . Osteoporosis    Related Meds:  Scheduled Meds:   . amitriptyline  50 mg Oral QHS  . aspirin EC  325 mg Oral Daily  . enoxaparin (LOVENOX) injection  30 mg Subcutaneous Daily  . gabapentin  600 mg Oral TID  . insulin aspart  0-15 Units Subcutaneous TID WC  . methylPREDNISolone (SOLU-MEDROL) injection  40 mg Intravenous BID  . moxifloxacin  400 mg Intravenous Once  . multivitamins ther. w/minerals  1 tablet Oral Daily  . oxybutynin  5 mg Oral Daily  . pantoprazole  40 mg Oral Q0600   Continuous Infusions:  PRN Meds:.albuterol, benzonatate, iohexol, ipratropium  Ht: 5\' 2"  (157.5 cm)  Wt: 173 lb 1 oz (78.5 kg)  Ideal Wt: 50.1 kg  % Ideal Wt: 157  Usual Wt: 79.5-81.8kg % Usual Wt: 96-98  Body mass index is 31.65 kg/(m^2).  Food/Nutrition Related Hx: Grandson reports pt with poor intake for the past week. Grandson states pt is alone at home most of the day. Pt c/o getting 'choked' on solid foods for the past month. Pt denies being on any nutritional supplements at home. Pt reports having diarrhea and constipation PTA, but reports she is on medications for this.   Labs:  CMP     Component Value Date/Time   NA 135 01/11/2011 0508   K 3.8 01/11/2011 0508   CL 98 01/11/2011 0508   CO2 25 01/11/2011 0508   GLUCOSE 122* 01/11/2011 0508   BUN 14 01/11/2011 0508   CREATININE 0.70 01/11/2011 0508   CALCIUM 9.3 01/11/2011 0508   PROT 6.8 04/30/2009 1234   ALBUMIN 3.8 04/30/2009 1234   AST 35 04/30/2009 1234   ALT 31 04/30/2009 1234   ALKPHOS 76 04/30/2009 1234   BILITOT 0.4 04/30/2009 1234   GFRNONAA 78* 01/11/2011 0508   GFRAA >90 01/11/2011 0508   CBG  (last 3)   Basename 01/11/11 0757  GLUCAP 169*    Intake/Output Summary (Last 24 hours) at 01/11/11 1118 Last data filed at 01/11/11 0900  Gross per 24 hour  Intake    120 ml  Output    500 ml  Net   -380 ml    Diet Order: Carb Control  IVF:    Estimated Nutritional Needs:   Kcal:1550-1800 Protein:80-95g Fluid:1.5-1.8L  NUTRITION DIAGNOSIS: -Predicted suboptimal energy intake (NI-1.6).  Status: Ongoing  RELATED TO: poor appetite, problems swallowing solid foods  AS EVIDENCE BY: pt's statement  MONITORING/EVALUATION(Goals): Pt able to safely consume >75% of meals.  EDUCATION NEEDS: -Education needs addressed. Gave pt nutrition handout that reviews high calorie/protein snacks and beverages.   INTERVENTION: Encouraged pt to eat small frequent snacks/meals every 4 hours. Encouraged adequate fluid intake. Gave pt chocolate Ensure to try. Recommend SLP evaluation. Will continue to monitor intake.     Dietitian [phone]  DOCUMENTATION CODES Per approved criteria  -Obesity Unspecified    Marshall Cork 01/11/2011, 11:13 AM

## 2011-01-12 ENCOUNTER — Inpatient Hospital Stay: Admission: RE | Admit: 2011-01-12 | Payer: Medicare Other | Source: Ambulatory Visit

## 2011-01-12 ENCOUNTER — Other Ambulatory Visit: Payer: Medicare Other

## 2011-01-12 ENCOUNTER — Inpatient Hospital Stay (HOSPITAL_COMMUNITY): Payer: Medicare Other

## 2011-01-12 DIAGNOSIS — D3000 Benign neoplasm of unspecified kidney: Secondary | ICD-10-CM | POA: Diagnosis present

## 2011-01-12 LAB — BASIC METABOLIC PANEL
CO2: 27 mEq/L (ref 19–32)
Calcium: 9.6 mg/dL (ref 8.4–10.5)
Chloride: 99 mEq/L (ref 96–112)
Glucose, Bld: 279 mg/dL — ABNORMAL HIGH (ref 70–99)
Sodium: 135 mEq/L (ref 135–145)

## 2011-01-12 LAB — HEPATITIS PANEL, ACUTE
HCV Ab: NEGATIVE
Hep A IgM: NEGATIVE

## 2011-01-12 LAB — GLUCOSE, CAPILLARY
Glucose-Capillary: 285 mg/dL — ABNORMAL HIGH (ref 70–99)
Glucose-Capillary: 277 mg/dL — ABNORMAL HIGH (ref 70–99)

## 2011-01-12 MED ORDER — ENOXAPARIN SODIUM 40 MG/0.4ML ~~LOC~~ SOLN
40.0000 mg | SUBCUTANEOUS | Status: DC
  Administered 2011-01-13: 40 mg via SUBCUTANEOUS
  Filled 2011-01-12: qty 0.4

## 2011-01-12 MED ORDER — LORAZEPAM 0.5 MG PO TABS: 0.5000 mg | ORAL_TABLET | Freq: Once | ORAL | Status: AC | PRN

## 2011-01-12 MED ORDER — INSULIN DETEMIR 100 UNIT/ML ~~LOC~~ SOLN
15.0000 [IU] | Freq: Every day | SUBCUTANEOUS | Status: DC
  Administered 2011-01-12: 15 [IU] via SUBCUTANEOUS
  Filled 2011-01-12: qty 3

## 2011-01-12 MED ORDER — PREDNISONE 20 MG PO TABS
40.0000 mg | ORAL_TABLET | Freq: Every day | ORAL | Status: DC
  Administered 2011-01-13: 40 mg via ORAL
  Filled 2011-01-12: qty 2

## 2011-01-12 MED ORDER — MOXIFLOXACIN HCL 400 MG PO TABS
400.0000 mg | ORAL_TABLET | Freq: Every day | ORAL | Status: DC
  Administered 2011-01-12: 400 mg via ORAL
  Filled 2011-01-12 (×2): qty 1

## 2011-01-12 MED ORDER — LORAZEPAM 0.5 MG PO TABS
0.5000 mg | ORAL_TABLET | Freq: Once | ORAL | Status: AC
  Administered 2011-01-12: 0.5 mg via ORAL
  Filled 2011-01-12: qty 1

## 2011-01-12 MED ORDER — GUAIFENESIN ER 600 MG PO TB12: 600.0000 mg | ORAL_TABLET | Freq: Two times a day (BID) | ORAL | Status: DC

## 2011-01-12 MED ORDER — GADOBENATE DIMEGLUMINE 529 MG/ML IV SOLN
20.0000 mL | Freq: Once | INTRAVENOUS | Status: AC | PRN
  Administered 2011-01-12: 16 mL via INTRAVENOUS

## 2011-01-12 NOTE — Progress Notes (Signed)
Subjective: Patient still feel weak, but she is better. She still has a cough, but that is better as well. The patient's grandson is concerned that she missed her MRI appointment today. He is requesting that this MRI be done inpatient if possible. There is also reports of patient having coughing spells when she eats or drinks. So, swallow evaluation is also recommended.  Objective: Vital signs in last 24 hours: Temp:  [97.7 F (36.5 C)-97.9 F (36.6 C)] 97.8 F (36.6 C) (11/07 0536) Pulse Rate:  [71-90] 71  (11/07 0536) Resp:  [16-18] 16  (11/07 0536) BP: (125-145)/(75-79) 145/77 mmHg (11/07 0536) SpO2:  [95 %-98 %] 95 % (11/07 1137) Weight change:  Last BM Date: 01/10/11  Intake/Output from previous day: 11/06 0701 - 11/07 0700 In: 60 [P.O.:60] Out: 200 [Urine:200] Intake/Output this shift: Total I/O In: 20 [P.O.:20] Out: -   Physical Exam  Vitals reviewed. Constitutional: She is well-developed, well-nourished, and in no distress. No distress.  HENT:  Head: Normocephalic and atraumatic.  Mouth/Throat: No oropharyngeal exudate.  Eyes: EOM are normal. Pupils are equal, round, and reactive to light. No scleral icterus.  Neck: Normal range of motion. Neck supple. No thyromegaly present.  Cardiovascular: Normal rate and regular rhythm.  Exam reveals no friction rub.   No murmur heard. Pulmonary/Chest: Effort normal. No respiratory distress. She has no wheezes. She has no rales.  Abdominal: Soft. She exhibits no distension. There is no tenderness. There is no rebound and no guarding.  Musculoskeletal: Normal range of motion.  Neurological: She is alert. No cranial nerve deficit.  Skin: Skin is warm and dry. She is not diaphoretic. No erythema.  Psychiatric: Affect normal.    Lab Results:  [facility] 01/11/11 0508 01/10/11 1923  WBC 6.2 7.0  HGB 11.7* 13.0  HCT 36.5 38.6  PLT 175 183   BMET  Basename 01/12/11 0325 01/11/11 0508  NA 135 135  K 4.4 3.8  CL 99 98  CO2  27 25  GLUCOSE 279* 122*  BUN 22 14  CREATININE 0.75 0.70  CALCIUM 9.6 9.3    Results for orders placed during the hospital encounter of 01/10/11 (from the past 24 hour(s))  GLUCOSE, CAPILLARY     Status: Abnormal   Collection Time   01/11/11 12:26 PM      Component Value Range   Glucose-Capillary 252 (*) 70 - 99 (mg/dL)  GLUCOSE, CAPILLARY     Status: Abnormal   Collection Time   01/11/11  5:18 PM      Component Value Range   Glucose-Capillary 274 (*) 70 - 99 (mg/dL)   Comment 1 Notify RN    CULTURE, SPUTUM-ASSESSMENT     Status: Normal   Collection Time   01/11/11  5:31 PM      Component Value Range   Specimen Description SPUTUM     Special Requests NONE     Sputum evaluation       Value: THIS SPECIMEN IS ACCEPTABLE. RESPIRATORY CULTURE REPORT TO FOLLOW.   Report Status 01/11/2011 FINAL    CULTURE, RESPIRATORY     Status: Normal (Preliminary result)   Collection Time   01/11/11  5:31 PM      Component Value Range   Specimen Description SPUTUM     Special Requests NONE     Gram Stain       Value: MODERATE WBC PRESENT, PREDOMINANTLY PMN     FEW SQUAMOUS EPITHELIAL CELLS PRESENT     MODERATE GRAM POSITIVE COCCI IN  PAIRS     IN CLUSTERS FEW GRAM POSITIVE RODS     RARE GRAM NEGATIVE RODS   Culture Culture reincubated for better growth     Report Status PENDING    GLUCOSE, CAPILLARY     Status: Abnormal   Collection Time   01/11/11 10:40 PM      Component Value Range   Glucose-Capillary 154 (*) 70 - 99 (mg/dL)   Comment 1 Documented in Chart     Comment 2 Notify RN    BASIC METABOLIC PANEL     Status: Abnormal   Collection Time   01/12/11  3:25 AM      Component Value Range   Sodium 135  135 - 145 (mEq/L)   Potassium 4.4  3.5 - 5.1 (mEq/L)   Chloride 99  96 - 112 (mEq/L)   CO2 27  19 - 32 (mEq/L)   Glucose, Bld 279 (*) 70 - 99 (mg/dL)   BUN 22  6 - 23 (mg/dL)   Creatinine, Ser 1.47  0.50 - 1.10 (mg/dL)   Calcium 9.6  8.4 - 82.9 (mg/dL)   GFR calc non Af Amer 76 (*)  >90 (mL/min)   GFR calc Af Amer 88 (*) >90 (mL/min)  GLUCOSE, CAPILLARY     Status: Abnormal   Collection Time   01/12/11  8:20 AM      Component Value Range   Glucose-Capillary 286 (*) 70 - 99 (mg/dL)    Studies/Results: Dg Chest 2 View  01/10/2011  *RADIOLOGY REPORT*  Clinical Data: Cough, hypoxia, fever  CHEST - 2 VIEW  Comparison: 12/26/2010; 07/12/2008; 10/06/2005; chest CT - 12/26/2010  Findings: Unchanged cardiac silhouette and mediastinal contours with mild tortuosity / unfolding of the thoracic aorta.  No focal parenchymal opacities.  Grossly unchanged bibasilar heterogeneous opacities, left greater than right.  likely atelectasis.  No definite pleural effusion or pneumothorax.  Unchanged bones including lower cervical spine ACDF and spinal process wire cerclage fixation, incompletely evaluated.  Redemonstrated mild focal kyphotic curvature of the lower thoracic spine.  IMPRESSION: Bibasilar atelectasis without acute cardiopulmonary disease.  Original Report Authenticated By: Waynard Reeds, M.D.   Dg Pelvis 1-2 Views  01/10/2011  *RADIOLOGY REPORT*  Clinical Data: Fall  PELVIS - 1-2 VIEW  Comparison: None.  Findings: Left total hip arthroplasty is in place.  No breakage or loosening of the hardware.  No acute fracture.  Osteopenia.  IMPRESSION: No acute bony pathology.  Left total hip arthroplasty.  Original Report Authenticated By: Donavan Burnet, M.D.   Ct Head Wo Contrast  01/10/2011  *RADIOLOGY REPORT*  Clinical Data: Dizziness, weakness.  CT HEAD WITHOUT CONTRAST  Technique:  Contiguous axial images were obtained from the base of the skull through the vertex without contrast.  Comparison: 11/11/2010  Findings: Mild prominence of the sulci, cisterns, and ventricles, in keeping with volume loss. There are mild subcortical and periventricular white matter hypodensities, a nonspecific finding most often seen with chronic microangiopathic changes.  There is no evidence for acute  hemorrhage, overt hydrocephalus, mass lesion, or abnormal extra-axial fluid collection.  No definite CT evidence for acute cortical based (large artery) infarction. Opacified left maxillary sinus, similar to prior.  Partially opacified left mastoid air cells.  Otherwise, The visualized paranasal sinuses and right mastoid air cells are predominately clear.  IMPRESSION: White matter hypodensities are most in keeping with chronic microangiopathic change, similar to prior.  No definite acute intracranial abnormality.  Partially opacified left mastoid air cells, can be  seen with mastoiditis.  Chronic left maxillary sinus disease.  Original Report Authenticated By: Waneta Martins, M.D.   Ct Angio Chest W/cm &/or Wo Cm  01/11/2011  *RADIOLOGY REPORT*  Clinical Data:  Hypoxia, elevated D-dimer.  CT ANGIOGRAPHY CHEST WITH CONTRAST  Technique:  Multidetector CT imaging of the chest was performed using the standard protocol during bolus administration of intravenous contrast.  Multiplanar CT image reconstructions including MIPs were obtained to evaluate the vascular anatomy.  Contrast: OMNIPAQUE IOHEXOL 300 MG/ML IV SOLN  Comparison:  01/10/2011 radiograph, 12/26/2010 CT  Findings:  11 mm left thyroid nodule with peripheral calcification.  There is no filling defect within the pulmonary arterial branches. Aberrant right subclavian artery courses posterior to the esophagus.  Mild ectasia/tortuosity of the aortic arch with scattered atherosclerotic calcification.  No dissection flap. Coronary artery calcification.  Mild cardiomegaly.  No pericardial or pleural effusion.  No intrathoracic lymphadenopathy.  Limited images through the upper abdomen show no acute abnormality. Lobular liver contour.  The previously described right renal lesion is not included on this examination.  Mild bibasilar scarring.  Mild lingular bronchiectasis / scarring. Mild ground-glass opacity within the upper lungs, adjacent to the major  fissures.  Otherwise, no focal areas of consolidation.  No pneumothorax.  There is debris within the bilateral lower lobe bronchioles.  Otherwise, the tracheobronchial tree is patent.  Partially imaged anterior cervical fusion hardware.  No acute osseous abnormality identified.  Review of the MIP images confirms the above findings.  IMPRESSION: No pulmonary embolism identified.  Debris within bilateral lower lobe bronchial may reflect aspiration or infection.  Cardiomegaly with coronary artery calcification.  Left thyroid lobe nodule with peripheral calcification.  Recommend non emergent ultrasound follow-up.  Original Report Authenticated By: Waneta Martins, M.D.    Medications:  Scheduled:   . amitriptyline  50 mg Oral QHS  . aspirin EC  325 mg Oral Daily  . dextromethorphan-guaiFENesin  1 tablet Oral BID  . enoxaparin (LOVENOX) injection  40 mg Subcutaneous Q24H  . gabapentin  600 mg Oral TID  . guaiFENesin  600 mg Oral BID  . insulin aspart  0-15 Units Subcutaneous TID WC  . insulin detemir  15 Units Subcutaneous QHS  . moxifloxacin  400 mg Oral q1800  . multivitamins ther. w/minerals  1 tablet Oral Daily  . oxybutynin  5 mg Oral Daily  . pantoprazole  40 mg Oral Q0600  . predniSONE  40 mg Oral QAC breakfast  . DISCONTD: enoxaparin (LOVENOX) injection  30 mg Subcutaneous Daily  . DISCONTD: methylPREDNISolone (SOLU-MEDROL) injection  40 mg Intravenous BID    Assessment/Plan: 1. Pneumonia: Avelox orally. Mucinex. 2. Copd with acute exacerbation: Better. Taper steroids. Check RA O2 sats. duoneb  3. Generalized weakness: PT/OT ongoing. Pt doesn't want rehab. May need home health. 4. Encephalopathy: Possibly due to pneumonia. Improved  5. Polymylagia Rheumatica: On steroids. 6. Arthritis  7. DM: Resume Levemir at lower dose. ADA diet. SSI. monitor with patient on steroids  8. Lesion in Right upper pole of kidney: MRI 9. Left thyroid nodule: outpatient f/u imaging recommended 10.  Cough with eating/drinking: Swallow eval to r/o dysphagia.  Full Code.  Hopefully home in 1-2 days.   LOS: 2 days   [name] 01/12/2011, 11:53 AM

## 2011-01-12 NOTE — Progress Notes (Signed)
Physical Therapy Treatment Patient Details Name: [name] MRN: [id number] DOB: [birth year] Today's Date: 01/12/2011  PT Assessment/Plan  PT - Assessment/Plan Comments on Treatment Session: Pt improving in activity tolerance with supplemental O2 PT Plan: Discharge plan remains appropriate;Frequency remains appropriate PT Goals  Acute Rehab PT Goals PT Goal: Supine/Side to Sit - Progress: Progressing toward goal PT Goal: Sit to Supine/Side - Progress: Progressing toward goal PT Transfer Goal: Sit to Stand/Stand to Sit - Progress: Progressing toward goal PT Goal: Ambulate - Progress: Progressing toward goal PT Goal: Perform Home Exercise Program - Progress: Progressing toward goal  PT Treatment Precautions/Restrictions  Precautions Precautions: Fall Precaution Comments: pt had episodes of falls at home recently Mobility (including Balance) Bed Mobility Left Sidelying to Sit: 4: Min assist Transfers Sit to Stand: 5: Supervision Sit to Stand Details (indicate cue type and reason): verbal cues to push  and reach back with arms Stand to Sit: 5: Supervision Ambulation/Gait Ambulation/Gait Assistance: 4: Min assist Ambulation/Gait Assistance Details (indicate cue type and reason): tends to push RW out in front , min assit needed to maneuver in small spaces Ambulation Distance (Feet): 200 Feet (150'. sitting rest break, 50') O2 sats 95% on 2L of O2 Assistive device: Rolling walker Gait Pattern: Step-through pattern;Trunk flexed Gait velocity: slow  Posture/Postural Control Postural Limitations: worked on static standing and bilaterl upper extremity hip to hip  exercises for core activation Static Standing Balance Static Standing - Level of Assistance: 5: Stand by assistance Exercise  Other Exercises Other Exercises: sit to stand with isometric activation in standing x 5 reps with not upper extremity support End of Session PT - End of Session Equipment Utilized During  Treatment: Gait belt (RW) Activity Tolerance: Patient tolerated treatment well Patient left: with family/visitor present (grandson) Nurse Communication: Mobility status for transfers;Mobility status for ambulation General Behavior During Session: [name] for tasks performed Cognition: [name] for tasks performed  Donnetta Hail 01/12/2011, 11:45 AM

## 2011-01-12 NOTE — Progress Notes (Signed)
Speech Language/Pathology Clinical/Bedside Swallow Evaluation Patient Details  Name: [name] MRN: [id number] DOB: 1925-01-12 Today's Date: 01/12/2011   Assessment/Recommendations/Treatment Plan SLP Assessment Clinical Impression Statement: Demonstrates a pharyngeal vs. esophageal (or both) dysphagia given signs and symptom report.  An objective sutdy is warranted to determine etiology of s/s as well determine airway compensatory strategies (pt. not wanting to alter diet per granson).  This suspectedi mpairment was identified by PCP and an outpatient MBSS was scheduled for this week.  Will proceed with study while an inpatient.  Recommendations: 1. Continue current diet 2. MBSS on 11/8 to objectively assess swallow function 3. Short term goals to follow thereafter if skilled SLP treatment warranted.  Jannifer Rodney, Roby Spalla L 01/12/2011,4:39 PM

## 2011-01-13 ENCOUNTER — Inpatient Hospital Stay (HOSPITAL_COMMUNITY): Payer: Medicare Other

## 2011-01-13 ENCOUNTER — Ambulatory Visit (HOSPITAL_COMMUNITY): Payer: Medicare Other

## 2011-01-13 ENCOUNTER — Other Ambulatory Visit (HOSPITAL_COMMUNITY): Payer: Medicare Other

## 2011-01-13 LAB — URINE CULTURE: Culture  Setup Time: 201211061304

## 2011-01-13 LAB — GLUCOSE, CAPILLARY: Glucose-Capillary: 254 mg/dL — ABNORMAL HIGH (ref 70–99)

## 2011-01-13 MED ORDER — MOXIFLOXACIN HCL 400 MG PO TABS: 400.0000 mg | ORAL_TABLET | Freq: Every day | ORAL | Status: AC

## 2011-01-13 MED ORDER — PREDNISONE 10 MG PO TABS: ORAL_TABLET | ORAL | Status: DC

## 2011-01-13 MED ORDER — DM-GUAIFENESIN ER 30-600 MG PO TB12: 1.0000 | ORAL_TABLET | Freq: Two times a day (BID) | ORAL | Status: AC

## 2011-01-13 MED ORDER — PREDNISONE 1 MG PO TABS: 1.0000 mg | ORAL_TABLET | Freq: Three times a day (TID) | ORAL | Status: DC

## 2011-01-13 MED ORDER — IPRATROPIUM-ALBUTEROL 18-103 MCG/ACT IN AERO: 2.0000 | INHALATION_SPRAY | Freq: Four times a day (QID) | RESPIRATORY_TRACT | Status: DC

## 2011-01-13 NOTE — Procedures (Addendum)
Modified Barium Swallow Procedure Note Patient Details  Name: [name] MRN: [id number] Date of Birth: 08-31-1924  Today's Date: 01/13/2011   Clinical Impression: Mild pharyngeal phase dysphagia;Suspected primary esophageal dysphagia.   Pt presents with a mild pharyngeal but primary esophageal dysphagia marked by: 1) intermittent penetration of thin and nectar-thick liquids when consuming in conjunction with solids; 2) barium stasis from distal to mid esophagus -  clearance facilitated  when alternating solid with liquid boluses.   Goal:  Pt will demonstrate use of strategies to facilitate esophageal clearance independently    Plan:  Continue regular diet with thin liquids Pills one at a time with water No supervision necessary Follow solids with sips of liquid to facilitate esophageal clearance Consider esophageal assessment  Blenda Mounts Laurice 01/13/2011, 2:17 PM

## 2011-01-13 NOTE — Progress Notes (Signed)
Provided family w/private sitter list per family request.

## 2011-01-13 NOTE — Progress Notes (Signed)
WILL PROVIDE HOME HEALTH CARE AGENCY LIST TO PATIENT TO CHOOSE.WILL NEED HH ORDERS IF MD FEEL MEDICALLY APPROPRIATE.

## 2011-01-13 NOTE — Discharge Summary (Signed)
Physician Discharge Summary  Patient ID: [name] MRN: [id number] DOB/AGE: [birth year] 77 y.o.  Admit date: 01/10/2011 Discharge date: 01/13/2011  Admission Diagnoses: Community-acquired pneumonia, COPD.  Discharge Diagnoses:  Principal Problem:  *Pneumonia Active Problems:  Diabetes mellitus  HTN (hypertension)  Polymyalgia rheumatica  Depression  Renal mass   Discharged Condition: fair  HPI BY Dr. Haroldine Laws:  This is a 77 y/o female brought to the ER for the second time this week by family. Patient has become increasingly confused for the past three days (she is alert on my interview). She has been coughing the last 2 months. She reports some wheezing. Here in the Er her Pulse ox was 88%. (She has been exposed to second hand smoke her entire marriage). Her cough is non-productive. She has been getting weaker and falling recently. She had a recent bout of diarrhea. She appetite has been poor. She has had some mild dizziness. She reports she falls because her left knee gives out. Per family she has problems holding objects, but on further questioning this is not new but has been ongoing approximately a year, she attributes it to her diabetes. She also does have bad arthritis with hip, b/l knee and other surgeries for arthritis. She has been having shakes, no fevers, no chills. She does have recurrent UTI. She does have intermittent confusion. History provided by patient, grandson.  Hospital Course:  This is an 77 year old, Caucasian female, who came in because of confusion and weakness. Patient was found to have a pneumonia and COPD exacerbation. Patient was put on antibiotics, steroids, nebulizer treatments, and she has shown improvement. Her mental status has returned to baseline. Initially, she required oxygen for saturation of 88%. Yesterday room air saturations were checked and was 94%. Her antibiotics were changed over to oral. Patient was seen by physical therapy. She was able  to ambulate with them. They did recommend short-term rehabilitation at the skilled nursing facility. However, patient wants to go home with home health. This has been discussed with the patient's grandson, and everybody is agreeable. Home health has been arranged.  Patient was supposed to get an MRI of her abdomen to evaluate a possible kidney lesion. This was arranged as an outpatient and patient got hospitalized in the, meantime. So, we ordered the MRI as an inpatient and, indeed, it showed a renal cell carcinoma in the right kidney. It was a very small size. I discussed this with Dr. Patsi Sears with urology and he recommends outpatient followup with him. He feels, that the patient may benefit from cryotherapy.  Also swallow evaluation was also recommended by Dr. Chilton Si, Her primary care physician. We initially had our speech pathologist see the patient who recommended MBS. There was no pharyngeal or oral dysphagia that was noted. Obviously, esophageal dysphasia cannot be ruled out. Patient doesn't have any overt symptoms of dysphagia at this time. So, I would recommend continued work up of this issue as an outpatient.  Rest of her medical issues have been stable. The grandson mentioned many issues with the patient's back, which are chronic. Have encouraged him to discuss this with the patient's PCP.   Recent Results (from the past 168 hour(s))  TROPONIN I   Collection Time   01/10/11  7:10 PM      Component Value Range   Troponin I <0.30  <0.30 (ng/mL)  CBC   Collection Time   01/10/11  7:23 PM      Component Value Range   WBC 7.0  4.0 -  10.5 (K/uL)   RBC 4.06  3.87 - 5.11 (MIL/uL)   Hemoglobin 13.0  12.0 - 15.0 (g/dL)   HCT 45.4  09.8 - 11.9 (%)   MCV 95.1  78.0 - 100.0 (fL)   MCH 32.0  26.0 - 34.0 (pg)   MCHC 33.7  30.0 - 36.0 (g/dL)   RDW 14.7  82.9 - 56.2 (%)   Platelets 183  150 - 400 (K/uL)  DIFFERENTIAL   Collection Time   01/10/11  7:23 PM      Component Value Range    Neutrophils Relative 65  43 - 77 (%)   Neutro Abs 4.6  1.7 - 7.7 (K/uL)   Lymphocytes Relative 21  12 - 46 (%)   Lymphs Abs 1.5  0.7 - 4.0 (K/uL)   Monocytes Relative 11  3 - 12 (%)   Monocytes Absolute 0.8  0.1 - 1.0 (K/uL)   Eosinophils Relative 3  0 - 5 (%)   Eosinophils Absolute 0.2  0.0 - 0.7 (K/uL)   Basophils Relative 0  0 - 1 (%)   Basophils Absolute 0.0  0.0 - 0.1 (K/uL)  BASIC METABOLIC PANEL   Collection Time   01/10/11  7:23 PM      Component Value Range   Sodium 134 (*) 135 - 145 (mEq/L)   Potassium 4.0  3.5 - 5.1 (mEq/L)   Chloride 98  96 - 112 (mEq/L)   CO2 25  19 - 32 (mEq/L)   Glucose, Bld 167 (*) 70 - 99 (mg/dL)   BUN 19  6 - 23 (mg/dL)   Creatinine, Ser 1.30  0.50 - 1.10 (mg/dL)   Calcium 9.4  8.4 - 86.5 (mg/dL)   GFR calc non Af Amer 78 (*) >90 (mL/min)   GFR calc Af Amer 90 (*) >90 (mL/min)  OCCULT BLOOD, POC DEVICE   Collection Time   01/10/11  7:56 PM      Component Value Range   Fecal Occult Bld NEGATIVE    D-DIMER, QUANTITATIVE   Collection Time   01/10/11  7:56 PM      Component Value Range   D-Dimer, Quant 1.70 (*) 0.00 - 0.48 (ug/mL-FEU)  URINALYSIS, ROUTINE W REFLEX MICROSCOPIC   Collection Time   01/10/11  8:29 PM      Component Value Range   Color, Urine YELLOW  YELLOW    Appearance CLEAR  CLEAR    Specific Gravity, Urine 1.014  1.005 - 1.030    pH 6.0  5.0 - 8.0    Glucose, UA 100 (*) NEGATIVE (mg/dL)   Hgb urine dipstick SMALL (*) NEGATIVE    Bilirubin Urine NEGATIVE  NEGATIVE    Ketones, ur >80 (*) NEGATIVE (mg/dL)   Protein, ur NEGATIVE  NEGATIVE (mg/dL)   Urobilinogen, UA 1.0  0.0 - 1.0 (mg/dL)   Nitrite POSITIVE (*) NEGATIVE    Leukocytes, UA NEGATIVE  NEGATIVE   URINE MICROSCOPIC-ADD ON   Collection Time   01/10/11  8:29 PM      Component Value Range   Squamous Epithelial / LPF RARE  RARE    RBC / HPF 0-2  <3 (RBC/hpf)   Bacteria, UA FEW (*) RARE   CULTURE, BLOOD (ROUTINE X 2)   Collection Time   01/11/11  5:08 AM       Component Value Range   Specimen Description BLOOD LEFT ANTECUBITAL     Special Requests BOTTLES DRAWN AEROBIC AND ANAEROBIC 5CC     Setup Time [id number]  Culture       Value:        BLOOD CULTURE RECEIVED NO GROWTH TO DATE CULTURE WILL BE HELD FOR 5 DAYS BEFORE ISSUING A FINAL NEGATIVE REPORT   Report Status PENDING    CULTURE, BLOOD (ROUTINE X 2)   Collection Time   01/11/11  5:08 AM      Component Value Range   Specimen Description BLOOD LEFT HAND     Special Requests BOTTLES DRAWN AEROBIC AND ANAEROBIC 2CC     Setup Time [id number]     Culture       Value:        BLOOD CULTURE RECEIVED NO GROWTH TO DATE CULTURE WILL BE HELD FOR 5 DAYS BEFORE ISSUING A FINAL NEGATIVE REPORT   Report Status PENDING    CBC   Collection Time   01/11/11  5:08 AM      Component Value Range   WBC 6.2  4.0 - 10.5 (K/uL)   RBC 3.84 (*) 3.87 - 5.11 (MIL/uL)   Hemoglobin 11.7 (*) 12.0 - 15.0 (g/dL)   HCT 91.4  78.2 - 95.6 (%)   MCV 95.1  78.0 - 100.0 (fL)   MCH 30.5  26.0 - 34.0 (pg)   MCHC 32.1  30.0 - 36.0 (g/dL)   RDW 21.3  08.6 - 57.8 (%)   Platelets 175  150 - 400 (K/uL)  BASIC METABOLIC PANEL   Collection Time   01/11/11  5:08 AM      Component Value Range   Sodium 135  135 - 145 (mEq/L)   Potassium 3.8  3.5 - 5.1 (mEq/L)   Chloride 98  96 - 112 (mEq/L)   CO2 25  19 - 32 (mEq/L)   Glucose, Bld 122 (*) 70 - 99 (mg/dL)   BUN 14  6 - 23 (mg/dL)   Creatinine, Ser 4.69  0.50 - 1.10 (mg/dL)   Calcium 9.3  8.4 - 62.9 (mg/dL)   GFR calc non Af Amer 78 (*) >90 (mL/min)   GFR calc Af Amer >90  >90 (mL/min)  HEPATITIS PANEL, ACUTE   Collection Time   01/11/11  5:08 AM      Component Value Range   Hepatitis B Surface Ag NEGATIVE  NEGATIVE    HCV Ab NEGATIVE  NEGATIVE    Hep A IgM NEGATIVE  NEGATIVE    Hep B C IgM NEGATIVE  NEGATIVE   GLUCOSE, CAPILLARY   Collection Time   01/11/11  7:57 AM      Component Value Range   Glucose-Capillary 169 (*) 70 - 99 (mg/dL)  URINALYSIS, ROUTINE W  REFLEX MICROSCOPIC   Collection Time   01/11/11  9:25 AM      Component Value Range   Color, Urine YELLOW  YELLOW    Appearance CLEAR  CLEAR    Specific Gravity, Urine 1.035 (*) 1.005 - 1.030    pH 6.0  5.0 - 8.0    Glucose, UA NEGATIVE  NEGATIVE (mg/dL)   Hgb urine dipstick NEGATIVE  NEGATIVE    Bilirubin Urine NEGATIVE  NEGATIVE    Ketones, ur 40 (*) NEGATIVE (mg/dL)   Protein, ur NEGATIVE  NEGATIVE (mg/dL)   Urobilinogen, UA 0.2  0.0 - 1.0 (mg/dL)   Nitrite NEGATIVE  NEGATIVE    Leukocytes, UA NEGATIVE  NEGATIVE   URINE CULTURE   Collection Time   01/11/11  9:25 AM      Component Value Range   Specimen Description URINE, RANDOM  Special Requests NONE     Setup Time [id number]     Colony Count 15,000 COLONIES/ML     Culture ESCHERICHIA COLI     Report Status 01/13/2011 FINAL     Organism ID, Bacteria ESCHERICHIA COLI    GLUCOSE, CAPILLARY   Collection Time   01/11/11 12:26 PM      Component Value Range   Glucose-Capillary 252 (*) 70 - 99 (mg/dL)  GLUCOSE, CAPILLARY   Collection Time   01/11/11  5:18 PM      Component Value Range   Glucose-Capillary 274 (*) 70 - 99 (mg/dL)   Comment 1 Notify RN    CULTURE, SPUTUM-ASSESSMENT   Collection Time   01/11/11  5:31 PM      Component Value Range   Specimen Description SPUTUM     Special Requests NONE     Sputum evaluation       Value: THIS SPECIMEN IS ACCEPTABLE. RESPIRATORY CULTURE REPORT TO FOLLOW.   Report Status 01/11/2011 FINAL    CULTURE, RESPIRATORY   Collection Time   01/11/11  5:31 PM      Component Value Range   Specimen Description SPUTUM     Special Requests NONE     Gram Stain       Value: MODERATE WBC PRESENT, PREDOMINANTLY PMN     FEW SQUAMOUS EPITHELIAL CELLS PRESENT     MODERATE GRAM POSITIVE COCCI IN PAIRS     IN CLUSTERS FEW GRAM POSITIVE RODS     RARE GRAM NEGATIVE RODS   Culture NORMAL OROPHARYNGEAL FLORA     Report Status PENDING    GLUCOSE, CAPILLARY   Collection Time   01/11/11 10:40 PM       Component Value Range   Glucose-Capillary 154 (*) 70 - 99 (mg/dL)   Comment 1 Documented in Chart     Comment 2 Notify RN    BASIC METABOLIC PANEL   Collection Time   01/12/11  3:25 AM      Component Value Range   Sodium 135  135 - 145 (mEq/L)   Potassium 4.4  3.5 - 5.1 (mEq/L)   Chloride 99  96 - 112 (mEq/L)   CO2 27  19 - 32 (mEq/L)   Glucose, Bld 279 (*) 70 - 99 (mg/dL)   BUN 22  6 - 23 (mg/dL)   Creatinine, Ser 8.29  0.50 - 1.10 (mg/dL)   Calcium 9.6  8.4 - 56.2 (mg/dL)   GFR calc non Af Amer 76 (*) >90 (mL/min)   GFR calc Af Amer 88 (*) >90 (mL/min)  GLUCOSE, CAPILLARY   Collection Time   01/12/11  8:20 AM      Component Value Range   Glucose-Capillary 286 (*) 70 - 99 (mg/dL)  GLUCOSE, CAPILLARY   Collection Time   01/12/11 12:27 PM      Component Value Range   Glucose-Capillary 352 (*) 70 - 99 (mg/dL)   Comment 1 Documented in Chart     Comment 2 Notify RN    GLUCOSE, CAPILLARY   Collection Time   01/12/11  5:09 PM      Component Value Range   Glucose-Capillary 285 (*) 70 - 99 (mg/dL)   Comment 1 Documented in Chart     Comment 2 Notify RN    GLUCOSE, CAPILLARY   Collection Time   01/12/11  9:04 PM      Component Value Range   Glucose-Capillary 277 (*) 70 - 99 (mg/dL)  GLUCOSE, CAPILLARY   Collection  Time   01/13/11  7:56 AM      Component Value Range   Glucose-Capillary 254 (*) 70 - 99 (mg/dL)  GLUCOSE, CAPILLARY   Collection Time   01/13/11 12:38 PM      Component Value Range   Glucose-Capillary 223 (*) 70 - 99 (mg/dL)   Dg Chest 2 View  16/03/958  *RADIOLOGY REPORT*  Clinical Data: Cough, hypoxia, fever  CHEST - 2 VIEW  Comparison: 12/26/2010; 07/12/2008; 10/06/2005; chest CT - 12/26/2010  Findings: Unchanged cardiac silhouette and mediastinal contours with mild tortuosity / unfolding of the thoracic aorta.  No focal parenchymal opacities.  Grossly unchanged bibasilar heterogeneous opacities, left greater than right.  likely atelectasis.  No definite  pleural effusion or pneumothorax.  Unchanged bones including lower cervical spine ACDF and spinal process wire cerclage fixation, incompletely evaluated.  Redemonstrated mild focal kyphotic curvature of the lower thoracic spine.  IMPRESSION: Bibasilar atelectasis without acute cardiopulmonary disease.  Original Report Authenticated By: Waynard Reeds, M.D.   Dg Pelvis 1-2 Views  01/10/2011  *RADIOLOGY REPORT*  Clinical Data: Fall  PELVIS - 1-2 VIEW  Comparison: None.  Findings: Left total hip arthroplasty is in place.  No breakage or loosening of the hardware.  No acute fracture.  Osteopenia.  IMPRESSION: No acute bony pathology.  Left total hip arthroplasty.  Original Report Authenticated By: Donavan Burnet, M.D.   Ct Head Wo Contrast  01/10/2011  *RADIOLOGY REPORT*  Clinical Data: Dizziness, weakness.  CT HEAD WITHOUT CONTRAST  Technique:  Contiguous axial images were obtained from the base of the skull through the vertex without contrast.  Comparison: 11/11/2010  Findings: Mild prominence of the sulci, cisterns, and ventricles, in keeping with volume loss. There are mild subcortical and periventricular white matter hypodensities, a nonspecific finding most often seen with chronic microangiopathic changes.  There is no evidence for acute hemorrhage, overt hydrocephalus, mass lesion, or abnormal extra-axial fluid collection.  No definite CT evidence for acute cortical based (large artery) infarction. Opacified left maxillary sinus, similar to prior.  Partially opacified left mastoid air cells.  Otherwise, The visualized paranasal sinuses and right mastoid air cells are predominately clear.  IMPRESSION: White matter hypodensities are most in keeping with chronic microangiopathic change, similar to prior.  No definite acute intracranial abnormality.  Partially opacified left mastoid air cells, can be seen with mastoiditis.  Chronic left maxillary sinus disease.  Original Report Authenticated By: Waneta Martins, M.D.   Ct Angio Chest W/cm &/or Wo Cm  01/11/2011  *RADIOLOGY REPORT*  Clinical Data:  Hypoxia, elevated D-dimer.  CT ANGIOGRAPHY CHEST WITH CONTRAST  Technique:  Multidetector CT imaging of the chest was performed using the standard protocol during bolus administration of intravenous contrast.  Multiplanar CT image reconstructions including MIPs were obtained to evaluate the vascular anatomy.  Contrast: OMNIPAQUE IOHEXOL 300 MG/ML IV SOLN  Comparison:  01/10/2011 radiograph, 12/26/2010 CT  Findings:  11 mm left thyroid nodule with peripheral calcification.  There is no filling defect within the pulmonary arterial branches. Aberrant right subclavian artery courses posterior to the esophagus.  Mild ectasia/tortuosity of the aortic arch with scattered atherosclerotic calcification.  No dissection flap. Coronary artery calcification.  Mild cardiomegaly.  No pericardial or pleural effusion.  No intrathoracic lymphadenopathy.  Limited images through the upper abdomen show no acute abnormality. Lobular liver contour.  The previously described right renal lesion is not included on this examination.  Mild bibasilar scarring.  Mild lingular bronchiectasis / scarring. Mild  ground-glass opacity within the upper lungs, adjacent to the major fissures.  Otherwise, no focal areas of consolidation.  No pneumothorax.  There is debris within the bilateral lower lobe bronchioles.  Otherwise, the tracheobronchial tree is patent.  Partially imaged anterior cervical fusion hardware.  No acute osseous abnormality identified.  Review of the MIP images confirms the above findings.  IMPRESSION: No pulmonary embolism identified.  Debris within bilateral lower lobe bronchial may reflect aspiration or infection.  Cardiomegaly with coronary artery calcification.  Left thyroid lobe nodule with peripheral calcification.  Recommend non emergent ultrasound follow-up.  Original Report Authenticated By: Waneta Martins, M.D.     Discharge Exam: Blood pressure 145/85, pulse 81, temperature 98.5 F (36.9 C), temperature source Oral, resp. rate 18, height 5\' 2"  (1.575 m), weight 78.5 kg (173 lb 1 oz), SpO2 96.00%. General appearance: alert and cooperative Resp: wheezes bibasilar Cardio: regular rate and rhythm, S1, S2 normal, no murmur, click, rub or gallop GI: soft, non-tender; bowel sounds normal; no masses,  no organomegaly Extremities: extremities normal, atraumatic, no cyanosis or edema Neurologic: Grossly normal  Disposition: Home or Self Care  Discharge Orders    Future Orders Please Complete By Expires   Diet - low sodium heart healthy      Increase activity slowly      Discharge instructions      Comments:   CALL DR. TANNENBAUM AT [phone] TO MAKE APPOINTMENT TO DISCUSS THE RIGHT KIDNEY TUMOR  SEE DR. GREEN TO TALK ABOUT YOUR DIFFICULTY SWALLOWING   No wound care        Current Discharge Medication List    START taking these medications   Details  dextromethorphan-guaiFENesin (MUCINEX DM) 30-600 MG per 12 hr tablet Take 1 tablet by mouth 2 (two) times daily. Qty: 28 tablet, Refills: 0    moxifloxacin (AVELOX) 400 MG tablet Take 1 tablet (400 mg total) by mouth daily at 6 PM. Qty: 6 tablet, Refills: 0      CONTINUE these medications which have CHANGED   Details  !! predniSONE (DELTASONE) 1 MG tablet Take 1 tablet (1 mg total) by mouth 3 (three) times daily. START TAKING ONCE THE 10MG  TABLETS OF PREDNISONE HAVE RUN OUT    !! predniSONE (DELTASONE) 10 MG tablet Take 4 tabs daily for 3 days, then 3 tabs daily for 3 days, then 2 tabs daily for 3 days, then 1 tab daily for 3 days then stop. Qty: 30 tablet, Refills: 0     !! - Potential duplicate medications found. Please discuss with provider.    CONTINUE these medications which have NOT CHANGED   Details  acyclovir (ZOVIRAX) 200 MG capsule Take 200 mg by mouth daily.     amitriptyline (ELAVIL) 50 MG tablet Take 50 mg by mouth at  bedtime.      aspirin EC 325 MG tablet Take 325 mg by mouth daily.      gabapentin (NEURONTIN) 300 MG capsule Take 600 mg by mouth 3 (three) times daily.      insulin detemir (LEVEMIR) 100 UNIT/ML injection Inject 32 Units into the skin at bedtime.     Multiple Vitamins-Minerals (MULTIVITAMINS THER. W/MINERALS) TABS Take 1 tablet by mouth daily.      nitrofurantoin, macrocrystal-monohydrate, (MACROBID) 100 MG capsule Take 100 mg by mouth 2 (two) times daily.      oxybutynin (DITROPAN-XL) 5 MG 24 hr tablet Take 5 mg by mouth daily.      solifenacin (VESICARE) 10 MG tablet Take 10  mg by mouth daily.        Follow-up Information    Follow up with TANNENBAUM, SIGMUND I, MD in 2 weeks. (TO DISCUSS KIDNEY TUMOR)    Contact information:   [address] [place], [address] [facility] [place] [address] [phone]       Follow up with GREEN, Lenon Curt, MD in 1 week. (TO DISCUSS DIFFICULTY SWALLOWING)    Contact information:   [address] [place] [address] [phone]         Patient also written for Combivent 2 puffs inhaled QID.  SignedOsvaldo Shipper 01/13/2011, 3:25 PM

## 2011-01-13 NOTE — Progress Notes (Signed)
[facility] CHOSEN FOR HHC.[facility] NOTIFIED TO FOLLOW.RECOMMEND HHRN/PT/OT, ?ST IF MD FEEL APPROPRIATE.NO FACE TO FACE NEEDED.

## 2011-01-13 NOTE — Progress Notes (Signed)
Inpatient Diabetes Program Recommendations  AACE/ADA: New Consensus Statement on Inpatient Glycemic Control (2009)  Target Ranges:  Prepandial:   less than 140 mg/dL      Peak postprandial:   less than 180 mg/dL (1-2 hours)      Critically ill patients:  140 - 180 mg/dL   Reason for Visit: hyperglycemia  Inpatient Diabetes Program Recommendations Insulin - Basal: Increase Levemir to 25 units QHS - Titrate up to home dose of 32 units QHS

## 2011-01-14 LAB — CULTURE, RESPIRATORY W GRAM STAIN: Culture: NORMAL

## 2011-01-17 LAB — CULTURE, BLOOD (ROUTINE X 2)
Culture  Setup Time: 201211060925
Culture  Setup Time: 201211060925
Culture: NO GROWTH

## 2011-01-20 ENCOUNTER — Telehealth: Payer: Self-pay | Admitting: *Deleted

## 2011-01-20 NOTE — Telephone Encounter (Signed)
Received vm call from pt requesting Dr. Cyndie Chime to do her surgery.  Returned call to pt & explained that Dr Frederich Chick does not do surgery.  She reports that she has kidney CA & has an appt with someone at the [place] office.

## 2011-01-20 NOTE — Telephone Encounter (Signed)
Pt. Called back & states that Her PCP wants her to make an appt with Dr. Cyndie Chime.  Note left for Dr. Cyndie Chime.

## 2011-01-25 ENCOUNTER — Other Ambulatory Visit: Payer: Self-pay | Admitting: Oncology

## 2011-01-25 ENCOUNTER — Telehealth: Payer: Self-pay | Admitting: *Deleted

## 2011-01-25 NOTE — Telephone Encounter (Signed)
Received call from pt. asking if she needs an appt. with Dr. Cyndie Chime re her kidney Ca.  Note to Dr. Cyndie Chime.

## 2011-01-26 ENCOUNTER — Telehealth: Payer: Self-pay | Admitting: *Deleted

## 2011-01-26 NOTE — Telephone Encounter (Signed)
Called [facility] to rquest notes per per Dr. Reece Agar, Dr. Marcello Fennel did not refer pt to Dr. Reece Agar so pt needs to sign a release of information, called [facility] and left message

## 2011-01-26 NOTE — Telephone Encounter (Signed)
Pt. Notified that office will call her for an appt with Dr. Cyndie Chime.  For now his 1st available is wed 02/09/11.  She expressed understanding.

## 2011-01-28 ENCOUNTER — Emergency Department (HOSPITAL_COMMUNITY): Payer: Medicare Other

## 2011-01-28 ENCOUNTER — Emergency Department (HOSPITAL_COMMUNITY)
Admission: EM | Admit: 2011-01-28 | Discharge: 2011-01-28 | Disposition: A | Discharge status: Home / Self Care | Payer: Medicare Other | Attending: Emergency Medicine | Admitting: Emergency Medicine

## 2011-01-28 ENCOUNTER — Encounter (HOSPITAL_COMMUNITY): Payer: Self-pay | Admitting: *Deleted

## 2011-01-28 DIAGNOSIS — S0003XA Contusion of scalp, initial encounter: Secondary | ICD-10-CM | POA: Insufficient documentation

## 2011-01-28 DIAGNOSIS — I10 Essential (primary) hypertension: Secondary | ICD-10-CM | POA: Insufficient documentation

## 2011-01-28 DIAGNOSIS — M542 Cervicalgia: Secondary | ICD-10-CM | POA: Insufficient documentation

## 2011-01-28 DIAGNOSIS — W07XXXA Fall from chair, initial encounter: Secondary | ICD-10-CM | POA: Insufficient documentation

## 2011-01-28 DIAGNOSIS — S0093XA Contusion of unspecified part of head, initial encounter: Secondary | ICD-10-CM

## 2011-01-28 DIAGNOSIS — H269 Unspecified cataract: Secondary | ICD-10-CM | POA: Insufficient documentation

## 2011-01-28 DIAGNOSIS — M810 Age-related osteoporosis without current pathological fracture: Secondary | ICD-10-CM | POA: Insufficient documentation

## 2011-01-28 DIAGNOSIS — M129 Arthropathy, unspecified: Secondary | ICD-10-CM | POA: Insufficient documentation

## 2011-01-28 DIAGNOSIS — Y998 Other external cause status: Secondary | ICD-10-CM | POA: Insufficient documentation

## 2011-01-28 DIAGNOSIS — W19XXXA Unspecified fall, initial encounter: Secondary | ICD-10-CM

## 2011-01-28 DIAGNOSIS — M51379 Other intervertebral disc degeneration, lumbosacral region without mention of lumbar back pain or lower extremity pain: Secondary | ICD-10-CM | POA: Insufficient documentation

## 2011-01-28 DIAGNOSIS — IMO0002 Reserved for concepts with insufficient information to code with codable children: Secondary | ICD-10-CM

## 2011-01-28 DIAGNOSIS — N390 Urinary tract infection, site not specified: Secondary | ICD-10-CM | POA: Insufficient documentation

## 2011-01-28 DIAGNOSIS — E119 Type 2 diabetes mellitus without complications: Secondary | ICD-10-CM | POA: Insufficient documentation

## 2011-01-28 DIAGNOSIS — M353 Polymyalgia rheumatica: Secondary | ICD-10-CM | POA: Insufficient documentation

## 2011-01-28 DIAGNOSIS — Y92009 Unspecified place in unspecified non-institutional (private) residence as the place of occurrence of the external cause: Secondary | ICD-10-CM | POA: Insufficient documentation

## 2011-01-28 DIAGNOSIS — M5137 Other intervertebral disc degeneration, lumbosacral region: Secondary | ICD-10-CM | POA: Insufficient documentation

## 2011-01-28 LAB — BASIC METABOLIC PANEL
BUN: 23 mg/dL (ref 6–23)
CO2: 29 mEq/L (ref 19–32)
GFR calc non Af Amer: 53 mL/min — ABNORMAL LOW (ref >90)
Glucose, Bld: 92 mg/dL (ref 70–99)
Potassium: 4 mEq/L (ref 3.5–5.1)

## 2011-01-28 LAB — URINALYSIS, ROUTINE W REFLEX MICROSCOPIC
Bilirubin Urine: NEGATIVE
Glucose, UA: 100 mg/dL — AB
Hgb urine dipstick: NEGATIVE
Specific Gravity, Urine: 1.02 (ref 1.005–1.030)
Urobilinogen, UA: 0.2 mg/dL (ref 0.0–1.0)

## 2011-01-28 LAB — CBC
HCT: 35.4 % — ABNORMAL LOW (ref 36.0–46.0)
Hemoglobin: 12.1 g/dL (ref 12.0–15.0)
MCH: 32.4 pg (ref 26.0–34.0)
MCHC: 34.2 g/dL (ref 30.0–36.0)

## 2011-01-28 LAB — DIFFERENTIAL
Basophils Relative: 1 % (ref 0–1)
Eosinophils Absolute: 0.4 10*3/uL (ref 0.0–0.7)
Lymphocytes Relative: 20 % (ref 12–46)
Monocytes Absolute: 0.6 10*3/uL (ref 0.1–1.0)
Neutro Abs: 4.2 10*3/uL (ref 1.7–7.7)

## 2011-01-28 MED ORDER — SODIUM CHLORIDE 0.9 % IV BOLUS (SEPSIS)
500.0000 mL | Freq: Once | INTRAVENOUS | Status: AC
  Administered 2011-01-28: 500 mL via INTRAVENOUS

## 2011-01-28 MED ORDER — CEPHALEXIN 500 MG PO CAPS
500.0000 mg | ORAL_CAPSULE | Freq: Once | ORAL | Status: AC
  Administered 2011-01-28: 500 mg via ORAL
  Filled 2011-01-28: qty 1

## 2011-01-28 MED ORDER — SODIUM CHLORIDE 0.9 % IV SOLN
INTRAVENOUS | Status: DC
  Administered 2011-01-28: 19:00:00 via INTRAVENOUS

## 2011-01-28 MED ORDER — CEPHALEXIN 250 MG PO CAPS: 250.0000 mg | ORAL_CAPSULE | Freq: Four times a day (QID) | ORAL | Status: DC

## 2011-01-28 NOTE — ED Provider Notes (Signed)
History     CSN: [id number] Arrival date & time: 01/28/2011  2:01 PM   First MD Initiated Contact with Patient 01/28/11 1558      Chief Complaint  Patient presents with  . Fall  . Weakness    (Consider location/radiation/quality/duration/timing/severity/associated sxs/prior treatment) Patient is a 77 y.o. female presenting with fall and weakness. The history is provided by the patient and a relative.  Fall The accident occurred more than 2 days ago. The fall occurred from a stool. She fell from a height of 1 to 2 ft. She landed on a hard floor. The point of impact was the head. The pain is present in the head (Lower). The pain is mild. She was ambulatory at the scene. There was no entrapment after the fall. There was no drug use involved in the accident. There was no alcohol use involved in the accident. Pertinent negatives include no visual change, no fever, no abdominal pain, no bowel incontinence, no nausea, no vomiting, no hematuria, no headaches, no hearing loss and no tingling. Associated symptoms comments: She has increasing weakness since the fall from stool and has fallen 2 more times since that without injury.. The symptoms are aggravated by activity and standing. She has tried nothing (Patient typically uses a walker or cane to help with ambulation. This preceded the fall from the stool) for the symptoms.  Weakness Primary symptoms do not include headaches, visual change, fever, nausea or vomiting.  Additional symptoms include weakness.   She was recently discharged from the hospital after treatment for pneumonia. During hospitalization, she was diagnosed with renal cell carcinoma, small, and felt to be amenable to treatment with cryotherapy. She has not seen the urologist for that treatment yet.  Past Medical History  Diagnosis Date  . Diabetes mellitus   . HTN (hypertension)   . Diabetes mellitus   . Polymyalgia rheumatica   . Arthritis   . Cataract   . Osteoporosis   .  Cancer     Past Surgical History  Procedure Date  . Joint replacement   . Back surgery     Family History  Problem Relation Age of Onset  . Leukemia      History  Substance Use Topics  . Smoking status: Former Smoker    Types: Cigarettes    Quit date: 01/11/1983  . Smokeless tobacco: Not on file  . Alcohol Use: No    OB History    Grav Para Term Preterm Abortions TAB SAB Ect Mult Living                  Review of Systems  Constitutional: Negative for fever.  Gastrointestinal: Negative for nausea, vomiting, abdominal pain and bowel incontinence.  Genitourinary: Negative for hematuria.  Neurological: Positive for weakness. Negative for tingling and headaches.  All other systems reviewed and are negative.    Allergies  Codeine and Morphine and related  Home Medications   Current Outpatient Rx  Name Route Sig Dispense Refill  . ACYCLOVIR 200 MG PO CAPS Oral Take 200 mg by mouth daily.     Marland Kitchen AMITRIPTYLINE HCL 50 MG PO TABS Oral Take 50 mg by mouth at bedtime.      . ASPIRIN EC 325 MG PO TBEC Oral Take 325 mg by mouth daily.      Marland Kitchen GABAPENTIN 300 MG PO CAPS Oral Take 600 mg by mouth 3 (three) times daily.      . INSULIN ASPART 100 UNIT/ML [place] SOLN Subcutaneous Inject  5 Units into the skin 3 (three) times daily before meals. If CBG is over 250.     Marland Kitchen INSULIN DETEMIR 100 UNIT/ML [place] SOLN Subcutaneous Inject 32 Units into the skin at bedtime.     Carma Leaven M PLUS PO TABS Oral Take 1 tablet by mouth daily.      Marland Kitchen NITROFURANTOIN MONOHYD MACRO 100 MG PO CAPS Oral Take 100 mg by mouth 2 (two) times daily.      Marland Kitchen PREDNISONE 1 MG PO TABS Oral Take 3 mg by mouth daily.      Marland Kitchen SOLIFENACIN SUCCINATE 10 MG PO TABS Oral Take 10 mg by mouth daily.     . CEPHALEXIN 250 MG PO CAPS Oral Take 1 capsule (250 mg total) by mouth 4 (four) times daily. 28 capsule 0    BP 112/49  Pulse 78  Temp(Src) 97.9 F (36.6 C) (Oral)  Resp 17  Ht 5' 2.5" (1.588 m)  Wt 178 lb (80.74 kg)  BMI 32.04  kg/m2  SpO2 96%  Physical Exam  Constitutional: She is oriented to person, place, and time. She appears well-developed and well-nourished. No distress.  HENT:  Head: Normocephalic and atraumatic.  Eyes: Conjunctivae and EOM are normal. Pupils are equal, round, and reactive to light.  Neck: Normal range of motion. Neck supple.  Cardiovascular: Normal rate and regular rhythm.   Pulmonary/Chest: Effort normal and breath sounds normal.  Abdominal: Soft. Bowel sounds are normal.  Musculoskeletal: She exhibits no edema and no tenderness.       She has right lumbar tenderness with fair range of motion of the lumbar spine  Neurological: She is alert and oriented to person, place, and time. No cranial nerve deficit. She exhibits normal muscle tone. Coordination normal.  Skin: Skin is warm and dry.  Psychiatric: She has a normal mood and affect. Her behavior is normal. Judgment and thought content normal.    ED Course  Procedures (including critical care time)  Date: 01/29/2011  Rate: 89  Rhythm: normal sinus rhythm  QRS Axis: normal  Intervals: normal  ST/T Wave abnormalities: normal  Conduction Disutrbances:none  Narrative Interpretation:   Old EKG Reviewed: none available  Labs Reviewed  CBC - Abnormal; Notable for the following:    RBC 3.73 (*)    HCT 35.4 (*)    Platelets 99 (*) RESULT REPEATED AND VERIFIED   All other components within normal limits  DIFFERENTIAL - Abnormal; Notable for the following:    Eosinophils Relative 6 (*)    All other components within normal limits  BASIC METABOLIC PANEL - Abnormal; Notable for the following:    GFR calc non Af Amer 53 (*)    GFR calc Af Amer 62 (*)    All other components within normal limits  URINALYSIS, ROUTINE W REFLEX MICROSCOPIC - Abnormal; Notable for the following:    Appearance CLOUDY (*)    Glucose, UA 100 (*)    Nitrite POSITIVE (*)    Leukocytes, UA SMALL (*)    All other components within normal limits  URINE  MICROSCOPIC-ADD ON - Abnormal; Notable for the following:    Bacteria, UA MANY (*)    Crystals CA OXALATE CRYSTALS (*)    All other components within normal limits  URINE CULTURE   Dg Chest 2 View  01/28/2011  *RADIOLOGY REPORT*  Clinical Data: Fall with back pain.  CHEST - 2 VIEW  Comparison: CT chest 01/10/2011 and chest radiograph 01/10/2011.  Findings: Trachea is midline.  Heart size stable. Thoracic aorta is tortuous.  There is mild interstitial prominence and indistinctness bilaterally, with areas of somewhat linear air space opacification in the right mid lung zone and left lung base.  No pleural fluid. Upper lumbar compression fracture is again noted.  IMPRESSION:  1. Pulmonary parenchymal interstitial prominence and indistinctness may be due to edema.  A viral process cannot be excluded. 2.  Probable scattered atelectasis bilaterally.  Original Report Authenticated By: Reyes Ivan, M.D.   Dg Lumbar Spine Complete  01/28/2011  *RADIOLOGY REPORT*  Clinical Data: 77 year old female with low back pain following fall.  LUMBAR SPINE - COMPLETE 4+ VIEW  Comparison: 01/10/2011 chest radiograph, 04/21/2009 abdominal radiograph and 01/08/2005 lumbar spine MRI.  Findings: Five non-rib bearing lumbar type vertebra are again identified with a moderate apex right lumbar scoliosis. A 75% T12 compression fracture is unchanged. There is no evidence of acute fracture or subluxation. Moderate to severe degenerative disc disease and moderate facet arthropathy throughout the lumbar spine again noted.  IMPRESSION: No evidence of acute bony abnormality.  Remote T12 compression fracture.  Scoliosis and moderate to severe multilevel degenerative changes again noted.  Original Report Authenticated By: Rosendo Gros, M.D.   Ct Head Wo Contrast  01/28/2011  *RADIOLOGY REPORT*  Clinical Data:  Fall.  Head pain.  History of neck surgery.  Neck pain.  CT HEAD WITHOUT CONTRAST CT CERVICAL SPINE WITHOUT CONTRAST   Technique:  Multidetector CT imaging of the head and cervical spine was performed following the standard protocol without intravenous contrast.  Multiplanar CT image reconstructions of the cervical spine were also generated.  Comparison:  01/10/2011.  CT HEAD  Findings: No mass lesion, mass effect, midline shift, hydrocephalus, hemorrhage.  No acute territorial cortical ischemia/infarct. Atrophy and chronic ischemic white matter disease is present.  Calvarium intact.  Sclerosis of the inferior left mastoid air cells compatible with remote mastoiditis.  Left maxillary mucous retention cyst or polyp.  No skull fracture.  IMPRESSION: Atrophy and chronic ischemic white matter disease without acute intracranial abnormality.  No interval change from recent prior.  CT CERVICAL SPINE  Findings: Straightening of the normal cervical lordosis.  No cervical spine fracture or dislocation.  Anterior cervical fusion from C5-C7.  Fusion appears complete at these levels.  Posterior cerclage wires are present around the C5-C7 spinous processes. Calcified disc protrusion is present at C4-C5.  2 mm anterolisthesis of C3 on C4.  Accessory ossicle is present adjacent to the anterior arch of C1.  Multilevel facet arthrosis with multilevel foraminal stenosis.  Ground-glass attenuation is present in the lung apices which may represent pulmonary edema, airspace disease or pneumonia or aspiration.  There is an aberrant right subclavian artery running posterior to the esophagus in the upper chest.  IMPRESSION: Negative for cervical spine fracture or dislocation.  Multilevel degenerative disease.  Uncomplicated fusion at C5-C7.  Aberrant right subclavian artery.  Original Report Authenticated By: Andreas Newport, M.D.   Ct Cervical Spine Wo Contrast  01/28/2011  *RADIOLOGY REPORT*  Clinical Data:  Fall.  Head pain.  History of neck surgery.  Neck pain.  CT HEAD WITHOUT CONTRAST CT CERVICAL SPINE WITHOUT CONTRAST  Technique:  Multidetector CT  imaging of the head and cervical spine was performed following the standard protocol without intravenous contrast.  Multiplanar CT image reconstructions of the cervical spine were also generated.  Comparison:  01/10/2011.  CT HEAD  Findings: No mass lesion, mass effect, midline shift, hydrocephalus, hemorrhage.  No acute territorial cortical  ischemia/infarct. Atrophy and chronic ischemic white matter disease is present.  Calvarium intact.  Sclerosis of the inferior left mastoid air cells compatible with remote mastoiditis.  Left maxillary mucous retention cyst or polyp.  No skull fracture.  IMPRESSION: Atrophy and chronic ischemic white matter disease without acute intracranial abnormality.  No interval change from recent prior.  CT CERVICAL SPINE  Findings: Straightening of the normal cervical lordosis.  No cervical spine fracture or dislocation.  Anterior cervical fusion from C5-C7.  Fusion appears complete at these levels.  Posterior cerclage wires are present around the C5-C7 spinous processes. Calcified disc protrusion is present at C4-C5.  2 mm anterolisthesis of C3 on C4.  Accessory ossicle is present adjacent to the anterior arch of C1.  Multilevel facet arthrosis with multilevel foraminal stenosis.  Ground-glass attenuation is present in the lung apices which may represent pulmonary edema, airspace disease or pneumonia or aspiration.  There is an aberrant right subclavian artery running posterior to the esophagus in the upper chest.  IMPRESSION: Negative for cervical spine fracture or dislocation.  Multilevel degenerative disease.  Uncomplicated fusion at C5-C7.  Aberrant right subclavian artery.  Original Report Authenticated By: Andreas Newport, M.D.     1. UTI (lower urinary tract infection)   2. Fall   3. Contusion of head   4. Neck pain   5. Degenerative disc disease       MDM  Multiple falls, subacute, with urinary tract infection. Patient has not sustained any serious illness or injury  and can be discharged with outpatient treatment.        Flint Melter, MD 01/29/11 [phone]

## 2011-01-28 NOTE — ED Notes (Signed)
Pt st's she fell twice this morning and this past Wednesday.  St's when she fell on Wed she hit her head hard, denies any LOC.  St's she often feels weak and loses her balance and falls, st's she has frequent UTIs, denies any signs of infection.  Small bump present to back of head where she st's she hit her head.  St's she came in today because her head is still hurting.

## 2011-01-28 NOTE — ED Notes (Signed)
Pt's son states pt fell wed and hit her head on the floor and she had two fall tody. Pt states she is unable to keep her balance. Pt denies any loc fall but c/o headache

## 2011-01-30 ENCOUNTER — Inpatient Hospital Stay (HOSPITAL_COMMUNITY)
Admission: EM | Admit: 2011-01-30 | Discharge: 2011-02-07 | DRG: 291 | Disposition: A | Discharge status: Skilled Nursing Facility | Payer: Medicare Other | Attending: Internal Medicine | Admitting: Internal Medicine

## 2011-01-30 ENCOUNTER — Other Ambulatory Visit: Payer: Self-pay

## 2011-01-30 ENCOUNTER — Encounter (HOSPITAL_COMMUNITY): Payer: Self-pay

## 2011-01-30 ENCOUNTER — Emergency Department (HOSPITAL_COMMUNITY): Payer: Medicare Other

## 2011-01-30 DIAGNOSIS — E8770 Fluid overload, unspecified: Secondary | ICD-10-CM | POA: Diagnosis present

## 2011-01-30 DIAGNOSIS — D509 Iron deficiency anemia, unspecified: Secondary | ICD-10-CM | POA: Diagnosis present

## 2011-01-30 DIAGNOSIS — A498 Other bacterial infections of unspecified site: Secondary | ICD-10-CM | POA: Diagnosis present

## 2011-01-30 DIAGNOSIS — R0902 Hypoxemia: Secondary | ICD-10-CM | POA: Diagnosis present

## 2011-01-30 DIAGNOSIS — N390 Urinary tract infection, site not specified: Secondary | ICD-10-CM | POA: Diagnosis present

## 2011-01-30 DIAGNOSIS — M353 Polymyalgia rheumatica: Secondary | ICD-10-CM | POA: Diagnosis present

## 2011-01-30 DIAGNOSIS — B9689 Other specified bacterial agents as the cause of diseases classified elsewhere: Secondary | ICD-10-CM | POA: Diagnosis present

## 2011-01-30 DIAGNOSIS — J189 Pneumonia, unspecified organism: Secondary | ICD-10-CM | POA: Diagnosis present

## 2011-01-30 DIAGNOSIS — J449 Chronic obstructive pulmonary disease, unspecified: Secondary | ICD-10-CM | POA: Diagnosis present

## 2011-01-30 DIAGNOSIS — E1165 Type 2 diabetes mellitus with hyperglycemia: Secondary | ICD-10-CM | POA: Diagnosis present

## 2011-01-30 DIAGNOSIS — I5031 Acute diastolic (congestive) heart failure: Principal | ICD-10-CM | POA: Diagnosis present

## 2011-01-30 DIAGNOSIS — D3000 Benign neoplasm of unspecified kidney: Secondary | ICD-10-CM | POA: Diagnosis present

## 2011-01-30 DIAGNOSIS — R7881 Bacteremia: Secondary | ICD-10-CM | POA: Diagnosis present

## 2011-01-30 DIAGNOSIS — J4489 Other specified chronic obstructive pulmonary disease: Secondary | ICD-10-CM | POA: Diagnosis present

## 2011-01-30 DIAGNOSIS — D539 Nutritional anemia, unspecified: Secondary | ICD-10-CM | POA: Clinically undetermined

## 2011-01-30 DIAGNOSIS — N289 Disorder of kidney and ureter, unspecified: Secondary | ICD-10-CM | POA: Diagnosis present

## 2011-01-30 DIAGNOSIS — Z9181 History of falling: Secondary | ICD-10-CM

## 2011-01-30 DIAGNOSIS — I509 Heart failure, unspecified: Secondary | ICD-10-CM | POA: Diagnosis present

## 2011-01-30 DIAGNOSIS — I10 Essential (primary) hypertension: Secondary | ICD-10-CM | POA: Diagnosis present

## 2011-01-30 LAB — CBC
HCT: 35.6 % — ABNORMAL LOW (ref 36.0–46.0)
Hemoglobin: 11.9 g/dL — ABNORMAL LOW (ref 12.0–15.0)
MCH: 31.6 pg (ref 26.0–34.0)
MCV: 94.4 fL (ref 78.0–100.0)
RBC: 3.77 MIL/uL — ABNORMAL LOW (ref 3.87–5.11)
WBC: 5.3 10*3/uL (ref 4.0–10.5)

## 2011-01-30 LAB — BASIC METABOLIC PANEL
CO2: 27 mEq/L (ref 19–32)
Calcium: 9.2 mg/dL (ref 8.4–10.5)
Chloride: 97 mEq/L (ref 96–112)
Creatinine, Ser: 0.96 mg/dL (ref 0.50–1.10)
Glucose, Bld: 120 mg/dL — ABNORMAL HIGH (ref 70–99)

## 2011-01-30 LAB — URINALYSIS, ROUTINE W REFLEX MICROSCOPIC
Glucose, UA: NEGATIVE mg/dL
Hgb urine dipstick: NEGATIVE
Protein, ur: NEGATIVE mg/dL
pH: 6 (ref 5.0–8.0)

## 2011-01-30 LAB — TROPONIN I: Troponin I: <0.3 ng/mL (ref <0.30)

## 2011-01-30 LAB — GLUCOSE, CAPILLARY: Glucose-Capillary: 109 mg/dL — ABNORMAL HIGH (ref 70–99)

## 2011-01-30 MED ORDER — ONDANSETRON HCL 4 MG/2ML IJ SOLN
4.0000 mg | Freq: Once | INTRAMUSCULAR | Status: AC
  Administered 2011-01-30: 4 mg via INTRAVENOUS
  Filled 2011-01-30: qty 2

## 2011-01-30 MED ORDER — ALBUTEROL SULFATE (5 MG/ML) 0.5% IN NEBU
5.0000 mg | INHALATION_SOLUTION | Freq: Once | RESPIRATORY_TRACT | Status: AC
  Administered 2011-01-30: 5 mg via RESPIRATORY_TRACT
  Filled 2011-01-30: qty 1

## 2011-01-30 MED ORDER — ASPIRIN EC 325 MG PO TBEC
325.0000 mg | DELAYED_RELEASE_TABLET | Freq: Every day | ORAL | Status: DC
  Administered 2011-01-31 – 2011-02-07 (×8): 325 mg via ORAL
  Filled 2011-01-30 (×8): qty 1

## 2011-01-30 MED ORDER — PIPERACILLIN-TAZOBACTAM 3.375 G IVPB
3.3750 g | Freq: Three times a day (TID) | INTRAVENOUS | Status: DC
  Administered 2011-01-31 – 2011-02-01 (×6): 3.375 g via INTRAVENOUS
  Filled 2011-01-30 (×10): qty 50

## 2011-01-30 MED ORDER — ACETAMINOPHEN 650 MG RE SUPP: 650.0000 mg | Freq: Four times a day (QID) | RECTAL | Status: DC | PRN

## 2011-01-30 MED ORDER — ONDANSETRON HCL 4 MG PO TABS: 4.0000 mg | ORAL_TABLET | Freq: Four times a day (QID) | ORAL | Status: DC | PRN

## 2011-01-30 MED ORDER — INSULIN ASPART 100 UNIT/ML ~~LOC~~ SOLN
3.0000 [IU] | Freq: Three times a day (TID) | SUBCUTANEOUS | Status: DC
  Administered 2011-01-31 – 2011-02-07 (×21): 3 [IU] via SUBCUTANEOUS
  Filled 2011-01-30: qty 3

## 2011-01-30 MED ORDER — PIPERACILLIN SOD-TAZOBACTAM SO 2.25 (2-0.25) G IV SOLR
3.3750 g | Freq: Once | INTRAVENOUS | Status: AC
  Administered 2011-01-30: 3.375 g via INTRAVENOUS
  Filled 2011-01-30: qty 3.38

## 2011-01-30 MED ORDER — ALUM & MAG HYDROXIDE-SIMETH 200-200-20 MG/5ML PO SUSP: 30.0000 mL | Freq: Four times a day (QID) | ORAL | Status: DC | PRN

## 2011-01-30 MED ORDER — INSULIN DETEMIR 100 UNIT/ML ~~LOC~~ SOLN
32.0000 [IU] | Freq: Every day | SUBCUTANEOUS | Status: DC
  Administered 2011-01-31 – 2011-02-06 (×8): 32 [IU] via SUBCUTANEOUS
  Filled 2011-01-30 (×2): qty 3

## 2011-01-30 MED ORDER — SOLIFENACIN SUCCINATE 5 MG PO TABS
10.0000 mg | ORAL_TABLET | Freq: Every day | ORAL | Status: DC
  Filled 2011-01-30 (×4): qty 2

## 2011-01-30 MED ORDER — AMITRIPTYLINE HCL 50 MG PO TABS
50.0000 mg | ORAL_TABLET | Freq: Every day | ORAL | Status: DC
  Administered 2011-01-30 – 2011-02-04 (×6): 50 mg via ORAL
  Filled 2011-01-30 (×8): qty 1

## 2011-01-30 MED ORDER — INSULIN ASPART 100 UNIT/ML ~~LOC~~ SOLN
0.0000 [IU] | Freq: Every day | SUBCUTANEOUS | Status: DC
  Administered 2011-02-01: 3 [IU] via SUBCUTANEOUS
  Administered 2011-02-04 – 2011-02-05 (×2): 2 [IU] via SUBCUTANEOUS
  Administered 2011-02-06: 3 [IU] via SUBCUTANEOUS
  Filled 2011-01-30 (×2): qty 3

## 2011-01-30 MED ORDER — THERA M PLUS PO TABS
1.0000 | ORAL_TABLET | Freq: Every day | ORAL | Status: DC
  Administered 2011-01-31 – 2011-02-04 (×5): 1 via ORAL
  Administered 2011-02-05 – 2011-02-06 (×2): via ORAL
  Administered 2011-02-07: 1 via ORAL
  Filled 2011-01-30 (×8): qty 1

## 2011-01-30 MED ORDER — ALBUTEROL SULFATE (5 MG/ML) 0.5% IN NEBU
2.5000 mg | INHALATION_SOLUTION | Freq: Four times a day (QID) | RESPIRATORY_TRACT | Status: DC
  Administered 2011-01-31 – 2011-02-07 (×27): 2.5 mg via RESPIRATORY_TRACT
  Filled 2011-01-30 (×24): qty 0.5

## 2011-01-30 MED ORDER — PIPERACILLIN-TAZOBACTAM 3.375 G IVPB: 3.3750 g | Freq: Four times a day (QID) | INTRAVENOUS | Status: DC

## 2011-01-30 MED ORDER — PREDNISONE 1 MG PO TABS
3.0000 mg | ORAL_TABLET | Freq: Every day | ORAL | Status: DC
  Administered 2011-01-31 – 2011-02-06 (×7): 3 mg via ORAL
  Filled 2011-01-30 (×7): qty 3

## 2011-01-30 MED ORDER — SODIUM CHLORIDE 0.9 % IV BOLUS (SEPSIS)
1000.0000 mL | Freq: Once | INTRAVENOUS | Status: AC
  Administered 2011-01-30: 1000 mL via INTRAVENOUS

## 2011-01-30 MED ORDER — INSULIN ASPART 100 UNIT/ML ~~LOC~~ SOLN
0.0000 [IU] | Freq: Three times a day (TID) | SUBCUTANEOUS | Status: DC
  Administered 2011-02-01: 2 [IU] via SUBCUTANEOUS
  Administered 2011-02-01: 1 [IU] via SUBCUTANEOUS
  Administered 2011-02-01: 2 [IU] via SUBCUTANEOUS
  Administered 2011-02-02: 3 [IU] via SUBCUTANEOUS
  Administered 2011-02-02: 2 [IU] via SUBCUTANEOUS
  Administered 2011-02-03: 1 [IU] via SUBCUTANEOUS
  Administered 2011-02-03: 3 [IU] via SUBCUTANEOUS
  Administered 2011-02-03 – 2011-02-04 (×2): 2 [IU] via SUBCUTANEOUS
  Administered 2011-02-04: 3 [IU] via SUBCUTANEOUS
  Administered 2011-02-05 – 2011-02-06 (×2): 1 [IU] via SUBCUTANEOUS
  Administered 2011-02-07 (×2): 5 [IU] via SUBCUTANEOUS

## 2011-01-30 MED ORDER — IPRATROPIUM BROMIDE 0.02 % IN SOLN
0.5000 mg | Freq: Once | RESPIRATORY_TRACT | Status: AC
  Administered 2011-01-30: 0.5 mg via RESPIRATORY_TRACT
  Filled 2011-01-30: qty 2.5

## 2011-01-30 MED ORDER — SODIUM CHLORIDE 0.9 % IJ SOLN
3.0000 mL | Freq: Two times a day (BID) | INTRAMUSCULAR | Status: DC
  Administered 2011-01-31 – 2011-02-07 (×14): 3 mL via INTRAVENOUS

## 2011-01-30 MED ORDER — ACETAMINOPHEN 325 MG PO TABS
650.0000 mg | ORAL_TABLET | Freq: Four times a day (QID) | ORAL | Status: DC | PRN
  Administered 2011-01-30 – 2011-02-06 (×6): 650 mg via ORAL
  Filled 2011-01-30 (×6): qty 2

## 2011-01-30 MED ORDER — GUAIFENESIN ER 600 MG PO TB12
600.0000 mg | ORAL_TABLET | Freq: Two times a day (BID) | ORAL | Status: DC
  Administered 2011-01-30 – 2011-02-02 (×6): 600 mg via ORAL
  Filled 2011-01-30 (×7): qty 1

## 2011-01-30 MED ORDER — IPRATROPIUM BROMIDE 0.02 % IN SOLN
0.5000 mg | Freq: Four times a day (QID) | RESPIRATORY_TRACT | Status: DC
  Administered 2011-01-31 – 2011-02-07 (×29): 0.5 mg via RESPIRATORY_TRACT
  Filled 2011-01-30 (×28): qty 2.5

## 2011-01-30 MED ORDER — GABAPENTIN 300 MG PO CAPS
600.0000 mg | ORAL_CAPSULE | Freq: Three times a day (TID) | ORAL | Status: DC
  Administered 2011-01-30 – 2011-02-05 (×18): 600 mg via ORAL
  Filled 2011-01-30 (×22): qty 2

## 2011-01-30 MED ORDER — POLYETHYLENE GLYCOL 3350 17 G PO PACK
17.0000 g | PACK | Freq: Every day | ORAL | Status: DC | PRN
Start: 2011-01-30 — End: 2011-02-07
  Filled 2011-01-30 (×2): qty 1

## 2011-01-30 MED ORDER — VANCOMYCIN HCL IN DEXTROSE 1-5 GM/200ML-% IV SOLN
1000.0000 mg | Freq: Once | INTRAVENOUS | Status: AC
  Administered 2011-01-30: 1000 mg via INTRAVENOUS
  Filled 2011-01-30: qty 200

## 2011-01-30 MED ORDER — VANCOMYCIN HCL 1000 MG IV SOLR
750.0000 mg | Freq: Two times a day (BID) | INTRAVENOUS | Status: DC
  Administered 2011-01-31 – 2011-02-01 (×3): 750 mg via INTRAVENOUS
  Filled 2011-01-30 (×4): qty 750

## 2011-01-30 MED ORDER — ONDANSETRON HCL 4 MG/2ML IJ SOLN
4.0000 mg | Freq: Four times a day (QID) | INTRAMUSCULAR | Status: DC | PRN
  Administered 2011-02-01: 4 mg via INTRAVENOUS
  Filled 2011-01-30: qty 2

## 2011-01-30 MED ORDER — ENOXAPARIN SODIUM 40 MG/0.4ML ~~LOC~~ SOLN
40.0000 mg | Freq: Every day | SUBCUTANEOUS | Status: DC
  Administered 2011-01-30 – 2011-02-06 (×8): 40 mg via SUBCUTANEOUS
  Filled 2011-01-30 (×9): qty 0.4

## 2011-01-30 NOTE — ED Notes (Addendum)
Son states she has been falling frequently---fell today and he found her in the floor--she denies any injury with that fall but was seen in here Thursday after falling and striking her head--Now she states she feels weak, audible expiratory wheeze and diminished in both lower lobes--pulse ox 89% on room air, very pale   B/P 137/74, HR 104, R 26,. Pulse ox 89%--placed on 2 liters 02 and pulse ox up to 97%, No swelling of lower extremities

## 2011-01-30 NOTE — ED Notes (Signed)
Called Donnie with respiratory to access pt d/t wheezing and low O2 sats -89%.Stated she will be here soon

## 2011-01-30 NOTE — ED Notes (Signed)
Attempted to call report - RN to return call.  

## 2011-01-30 NOTE — ED Notes (Signed)
B/P 123/65, HR 94  R 22  Pulse OX 95% on 2 liters of 02---Appears more alert and is more talkative.  Son is at bedside,

## 2011-01-30 NOTE — H&P (Signed)
PCP:   [name]   Chief Complaint:   HPI: [name] who was admitted to [facility] recently and diagnosed with a pneumonia and COPD exacerbation. He was discharged on 11/8 on Avelox and prednisone. The patient returns today due to increasing weakness, falls and worsening cough. She does not admit to having any fevers but she did have some chills yesterday.  Her son states that she is coughing up brownish colored mucus. She states she has not yet finished her antibiotic but has finished the prednisone that she was prescribed. The patient also states that she's been losing her balance and falling more frequently than before. His son states that physical therapy was coming to her house however she told him she felt better and let him go. Since then she has been falling more frequently. She states this is secondary to feeling off balance but she does not complain of any dizziness or vertigo. She has not noticed any new focal weakness or numbness.  A chest x-ray performed in the ER is consistent with multifocal infiltrates and she is going to be admitted for treatment of pneumonia.  Review of Systems:  She admits to having a poor appetite and has maybe lost a couple of pounds in the past few weeks. No fevers but she's had some chills yesterday. She complains of increased generalized weakness and falls as mentioned in history of present illness.  No frequent headaches, blurred vision, double vision, sore throat, sinus trouble or earache. Respiratory positive for cough wheezing and shortness of breath with exertion.  no chest pain palpitations or pedal edema. No nausea vomiting or diarrhea. She usually is constipated. No dysuria or hematuria. She has arthritis and pain in her back. No focal numbness weakness or history of strokes or seizures. No depression or anxiety. No rash or easy bruising.   Past Medical History: Past Medical History  Diagnosis Date  . Diabetes  mellitus   . HTN (hypertension)    Recent diagnosis of a Right Renal Mass (MRI) consistent with renal cell carcinoma   . Polymyalgia rheumatica   . Arthritis   . Cataract   . Osteoporosis   .     Past Surgical History  Procedure Date  . Joint replacement   . Back surgery     Medications: The patient is not able to confirm all of the medications listed. Her son will bring in a medication list tomorrow.   Prior to Admission medications   Medication Sig Start Date End Date Taking? Authorizing Provider  amitriptyline (ELAVIL) 50 MG tablet Take 50 mg by mouth at bedtime.     Yes Historical Provider, MD  aspirin EC 325 MG tablet Take 325 mg by mouth daily.     Yes Historical Provider, MD  cephALEXin (KEFLEX) 250 MG capsule Take 1 capsule (250 mg total) by mouth 4 (four) times daily. 01/28/11 02/07/11 Yes Shelley Melter, MD  gabapentin (NEURONTIN) 300 MG capsule Take 600 mg by mouth 3 (three) times daily.     Yes Historical Provider, MD  insulin aspart (NOVOLOG) 100 UNIT/ML injection Inject 5 Units into the skin 3 (three) times daily before meals. If CBG is over 250.    Yes Historical Provider, MD  insulin detemir (LEVEMIR) 100 UNIT/ML injection Inject 32 Units into the skin at bedtime.    Yes Historical Provider, MD  Multiple Vitamins-Minerals (MULTIVITAMINS THER. W/MINERALS) TABS Take 1 tablet by mouth daily.     Yes Historical Provider, MD  nitrofurantoin, macrocrystal-monohydrate, (MACROBID) 100 MG capsule Take 100 mg by mouth 2 (two) times daily.     Yes Historical Provider, MD  predniSONE (DELTASONE) 1 MG tablet Take 3 mg by mouth daily.     Yes Historical Provider, MD  solifenacin (VESICARE) 10 MG tablet Take 10 mg by mouth daily.    Yes Historical Provider, MD    Allergies:   Allergies  Allergen Reactions  . Codeine   . Morphine And Related Other (See Comments)    Pt not sure of reaction to morphine    Social History:  reports that she quit smoking about 28 years ago. Her  smoking use included Cigarettes. She does not have any smokeless tobacco history on file. She reports that she does not drink alcohol or use illicit drugs.  Family History: Family History  Problem Relation Age of Onset  . Leukemia      Physical Exam: Filed Vitals:   01/30/11 1455 01/30/11 1615  BP: 146/104   Pulse: 108   Temp: 99.1 F (37.3 C)   TempSrc: Oral   Resp: 20   SpO2: 91% 96%   General appearance: alert, cooperative and no distress Head: Normocephalic, without obvious abnormality, atraumatic Throat: lips, mucosa, and tongue normal; teeth and gums normal Resp: crackles at bases bilaterally but worse on the right. Ronchi. No wheezing currently.  Chest wall: no tenderness Cardio: regular rate and rhythm, S1, S2 normal, no murmur, click, rub or gallop GI: soft, non-tender; bowel sounds normal; no masses,  no organomegaly Extremities: extremities normal, atraumatic, no cyanosis or edema Skin: Skin color, texture, turgor normal. No rashes or lesions Neurologic: Grossly normal   Labs on Admission:   [name] 01/30/11 1603 01/28/11 1800  NA 133* 135  K 4.1 4.0  CL 97 99  CO2 27 29  GLUCOSE 120* 92  BUN 24* 23  CREATININE 0.96 0.96  CALCIUM 9.2 9.8  MG -- --  PHOS -- --   No results found for this basename: AST:2,ALT:2,ALKPHOS:2,BILITOT:2,PROT:2,ALBUMIN:2 in the last 72 hours No results found for this basename: LIPASE:2,AMYLASE:2 in the last 72 hours  Basename 01/30/11 1603 01/28/11 1800  WBC 5.3 6.6  NEUTROABS -- 4.2  HGB 11.9* 12.1  HCT 35.6* 35.4*  MCV 94.4 94.9  PLT 113* 99*   No results found for this basename: CKTOTAL:3,CKMB:3,CKMBINDEX:3,TROPONINI:3 in the last 72 hours No results found for this basename: TSH,T4TOTAL,FREET3,T3FREE,THYROIDAB in the last 72 hours No results found for this basename: VITAMINB12:2,FOLATE:2,FERRITIN:2,TIBC:2,IRON:2,RETICCTPCT:2 in the last 72 hours  Radiological Exams on Admission: Dg Chest 2 View  01/30/2011   *RADIOLOGY REPORT*  Clinical Data: Fall with cough and chest pain.  CHEST - 2 VIEW  Comparison: 01/28/2011 and CT chest 01/10/2011.  Findings: Trachea is midline.  Heart size stable.  Lungs are low in volume with diffuse bilateral air space disease.  No pleural fluid.  IMPRESSION: Low lung volumes with diffuse bilateral air space disease.  Edema or pneumonia could have this appearance.  Original Report Authenticated By: Reyes Ivan, M.D.    EKG:   Assessment/Plan Active Problems:  Pneumonia and fever- temperature is 100.6 when checked rectally. She was already treated with Avelox without improvement. At this point it is considered healthcare acquired pneumonia since she was in the [facility] a few weeks ago. We will treat with vancomycin and Zosyn. I will order a sputum culture and Legionella and streptococcal antigens. We will continue oxygen as needed.  COPD-nebulizer treatments. Hold off on starting steroids as she has improved  after nebulizer treatment in the ER.  Frequent falls-I will order physical therapy. The patient, however, refuses going to a rehabilitation facility or assisted living and will most likely prefer going home with home health physical therapy.   Diabetes mellitus-resume home medications.She knows the doses of levemir and novolog therefore, I have ordered them.    HTN (hypertension)-she states she is on a medication for this but she is unable to recall the name- her son will be bringing in a list.   Polymyalgia rheumatica-continue prednisone at 3 mg daily.   Renal mass-outpatient workup. she has an appointment with the urologist.   As mentioned above we will need to followup on medications which she takes at home.   [name] 01/30/2011, 6:56 PM

## 2011-01-30 NOTE — Progress Notes (Signed)
ANTIBIOTIC CONSULT NOTE - INITIAL  Pharmacy Consult for Vancomycin Indication: rule out pneumonia  Allergies  Allergen Reactions  . Codeine   . Morphine And Related Other (See Comments)    Pt not sure of reaction to morphine    Patient Measurements: Weight: 174 lb 2.6 oz (79 kg) Adjusted Body Weight:  Vital Signs: Temp: 98 F (36.7 C) (11/25 2106) Temp src: Oral (11/25 2106) BP: 121/79 mmHg (11/25 2106) Pulse Rate: 92  (11/25 2106) Intake/Output from previous day:   Intake/Output from this shift:    Labs:  Basename 01/30/11 1603 01/28/11 1800  WBC 5.3 6.6  HGB 11.9* 12.1  PLT 113* 99*  LABCREA -- --  CREATININE 0.96 0.96   The CrCl is unknown because both a height and weight (above a minimum accepted value) are required for this calculation. No results found for this basename: VANCOTROUGH:2,VANCOPEAK:2,VANCORANDOM:2,GENTTROUGH:2,GENTPEAK:2,GENTRANDOM:2,TOBRATROUGH:2,TOBRAPEAK:2,TOBRARND:2,AMIKACINPEAK:2,AMIKACINTROU:2,AMIKACIN:2, in the last 72 hours   Microbiology: Recent Results (from the past 720 hour(s))  CULTURE, BLOOD (ROUTINE X 2)     Status: Normal   Collection Time   01/11/11  5:08 AM      Component Value Range Status Comment   Specimen Description BLOOD LEFT ANTECUBITAL   Final    Special Requests BOTTLES DRAWN AEROBIC AND ANAEROBIC 5CC   Final    Setup Time [id number]   Final    Culture NO GROWTH 5 DAYS   Final    Report Status 01/17/2011 FINAL   Final   CULTURE, BLOOD (ROUTINE X 2)     Status: Normal   Collection Time   01/11/11  5:08 AM      Component Value Range Status Comment   Specimen Description BLOOD LEFT HAND   Final    Special Requests BOTTLES DRAWN AEROBIC AND ANAEROBIC 2CC   Final    Setup Time [id number]   Final    Culture NO GROWTH 5 DAYS   Final    Report Status 01/17/2011 FINAL   Final   URINE CULTURE     Status: Normal   Collection Time   01/11/11  9:25 AM      Component Value Range Status Comment   Specimen Description  URINE, RANDOM   Final    Special Requests NONE   Final    Setup Time [id number]   Final    Colony Count 15,000 COLONIES/ML   Final    Culture ESCHERICHIA COLI   Final    Report Status 01/13/2011 FINAL   Final    Organism ID, Bacteria ESCHERICHIA COLI   Final   CULTURE, SPUTUM-ASSESSMENT     Status: Normal   Collection Time   01/11/11  5:31 PM      Component Value Range Status Comment   Specimen Description SPUTUM   Final    Special Requests NONE   Final    Sputum evaluation     Final    Value: THIS SPECIMEN IS ACCEPTABLE. RESPIRATORY CULTURE REPORT TO FOLLOW.   Report Status 01/11/2011 FINAL   Final   CULTURE, RESPIRATORY     Status: Normal   Collection Time   01/11/11  5:31 PM      Component Value Range Status Comment   Specimen Description SPUTUM   Final    Special Requests NONE   Final    Gram Stain     Final    Value: MODERATE WBC PRESENT, PREDOMINANTLY PMN     FEW SQUAMOUS EPITHELIAL CELLS PRESENT     MODERATE GRAM POSITIVE COCCI IN  PAIRS     IN CLUSTERS FEW GRAM POSITIVE RODS     RARE GRAM NEGATIVE RODS   Culture NORMAL OROPHARYNGEAL FLORA   Final    Report Status 01/14/2011 FINAL   Final   URINE CULTURE     Status: Normal (Preliminary result)   Collection Time   01/28/11  5:33 PM      Component Value Range Status Comment   Specimen Description URINE, CLEAN CATCH   Final    Special Requests NONE   Final    Setup Time [id number]   Final    Colony Count >=100,000 COLONIES/ML   Final    Culture ESCHERICHIA COLI   Final    Report Status PENDING   Incomplete     Medical History: Past Medical History  Diagnosis Date  . Diabetes mellitus   . HTN (hypertension)   . Diabetes mellitus   . Polymyalgia rheumatica   . Arthritis   . Cataract   . Osteoporosis   . Cancer     Medications:  Scheduled:    . albuterol  2.5 mg Nebulization Q6H  . albuterol  5 mg Nebulization Once  . amitriptyline  50 mg Oral QHS  . aspirin EC  325 mg Oral Daily  . enoxaparin  40 mg  Subcutaneous QHS  . gabapentin  600 mg Oral TID  . guaiFENesin  600 mg Oral BID  . insulin aspart  0-5 Units Subcutaneous QHS  . insulin aspart  0-9 Units Subcutaneous TID WC  . insulin aspart  3 Units Subcutaneous TID WC  . insulin detemir  32 Units Subcutaneous QHS  . ipratropium  0.5 mg Nebulization Once  . ipratropium  0.5 mg Nebulization Q6H  . multivitamins ther. w/minerals  1 tablet Oral Daily  . ondansetron (ZOFRAN) IV  4 mg Intravenous Once  . piperacillin-tazobactam (ZOSYN)  IV  3.375 g Intravenous Once  . piperacillin-tazobactam (ZOSYN)  IV  3.375 g Intravenous Q8H  . predniSONE  3 mg Oral Daily  . sodium chloride  1,000 mL Intravenous Once  . sodium chloride  3 mL Intravenous Q12H  . solifenacin  10 mg Oral Daily  . vancomycin  1,000 mg Intravenous Once  . DISCONTD: piperacillin-tazobactam (ZOSYN)  IV  3.375 g Intravenous Q6H   Assessment: 77 yo female previously hopitalized with pneumonia and discharged on po avelox & prednisone, presents to the ER today with increasing weakness and cough.  She was given zosyn 3.375gm IV and vancomycin 1gm IV and transferred to the floor.  Now to begin both antibiotics per pharmacy protocol.  Goal of Therapy:  Vancomycin trough level 15-20 mcg/ml  Plan:  1. Zosyn 3.375gm IV q8h (4hr extended infusions) 2. After Vancomycin 1gm, continue with 750mg  IV q12h 3. Check vancomycin trough at steady state 4. Follow renal function & cultures  Loralee Pacas R 01/30/2011,10:48 PM

## 2011-01-30 NOTE — ED Provider Notes (Addendum)
History     CSN: [id number] Arrival date & time: 01/30/2011  2:51 PM   First MD Initiated Contact with Patient 01/30/11 1509      Chief Complaint  Patient presents with  . Fall    pt in from home with frequent fall and failure to thrive family states was recently seen for the same no change in sx denies pain or discomfort pt is currently being tx for UTI    (Consider location/radiation/quality/duration/timing/severity/associated sxs/prior treatment) Patient is a 77 y.o. female presenting with fall. The history is provided by the patient, medical records and a relative.  Fall   the patient presents today complaining of generalized weakness increased work of breathing and multiple falls.  Her last fall was today.  She fell from the bed and landed on her bottom.  She has no complaints of neck pain chest pain or abdominal pain.  She denies no pain in her bilateral hips.  Her son reports that she continues to be weak at home and he reports increased respiratory rate over the past several days.  Patient reports mild nonproductive cough.  She denies fevers or chills.  She denies unilateral leg swelling.  No history of DVT or pulmonary embolus.  Review of the chart demonstrates the patient was admitted in November 5th at that time was treated for pneumonia as well as COPD exacerbation.  Nothing worsens her symptoms.  Nothing improves her symptoms.  Symptoms are constant.  They're moderate in severity  Past Medical History  Diagnosis Date  . Diabetes mellitus   . HTN (hypertension)   . Diabetes mellitus   . Polymyalgia rheumatica   . Arthritis   . Cataract   . Osteoporosis   . Cancer     Past Surgical History  Procedure Date  . Joint replacement   . Back surgery     Family History  Problem Relation Age of Onset  . Leukemia      History  Substance Use Topics  . Smoking status: Former Smoker    Types: Cigarettes    Quit date: 01/11/1983  . Smokeless tobacco: Not on file  .  Alcohol Use: No    OB History    Grav Para Term Preterm Abortions TAB SAB Ect Mult Living                  Review of Systems  All other systems reviewed and are negative.    Allergies  Codeine and Morphine and related  Home Medications   Current Outpatient Rx  Name Route Sig Dispense Refill  . ACYCLOVIR 200 MG PO CAPS Oral Take 200 mg by mouth daily.     Marland Kitchen AMITRIPTYLINE HCL 50 MG PO TABS Oral Take 50 mg by mouth at bedtime.      . ASPIRIN EC 325 MG PO TBEC Oral Take 325 mg by mouth daily.      . CEPHALEXIN 250 MG PO CAPS Oral Take 1 capsule (250 mg total) by mouth 4 (four) times daily. 28 capsule 0  . GABAPENTIN 300 MG PO CAPS Oral Take 600 mg by mouth 3 (three) times daily.      . INSULIN ASPART 100 UNIT/ML [place] SOLN Subcutaneous Inject 5 Units into the skin 3 (three) times daily before meals. If CBG is over 250.     Marland Kitchen INSULIN DETEMIR 100 UNIT/ML [place] SOLN Subcutaneous Inject 32 Units into the skin at bedtime.     Carma Leaven M PLUS PO TABS Oral Take 1  tablet by mouth daily.      Marland Kitchen NITROFURANTOIN MONOHYD MACRO 100 MG PO CAPS Oral Take 100 mg by mouth 2 (two) times daily.      Marland Kitchen PREDNISONE 1 MG PO TABS Oral Take 3 mg by mouth daily.      Marland Kitchen SOLIFENACIN SUCCINATE 10 MG PO TABS Oral Take 10 mg by mouth daily.       BP 146/104  Pulse 108  Temp(Src) 99.1 F (37.3 C) (Oral)  Resp 20  SpO2 96%  Physical Exam  Nursing note and vitals reviewed. Constitutional: She is oriented to person, place, and time. She appears well-developed and well-nourished. No distress.       O2 sats 80% on room air, oral temp 99.1 pulse 108  HENT:  Head: Normocephalic and atraumatic.  Eyes: EOM are normal.  Neck: Normal range of motion.  Cardiovascular: Normal rate, regular rhythm and normal heart sounds.   Pulmonary/Chest: Effort normal. She has wheezes.       Wheezing bilaterally  Abdominal: Soft. She exhibits no distension. There is no tenderness.  Musculoskeletal: Normal range of motion.    Neurological: She is alert and oriented to person, place, and time.  Skin: Skin is warm and dry.  Psychiatric: She has a normal mood and affect. Judgment normal.    ED Course  Procedures (including critical care time)   Date: 01/30/2011  Rate: 95  Rhythm: normal sinus rhythm  QRS Axis: normal  Intervals: normal  ST/T Wave abnormalities: normal  Conduction Disutrbances:none  Narrative Interpretation:   Old EKG Reviewed: No significant changes noted     Labs Reviewed  CBC - Abnormal; Notable for the following:    RBC 3.77 (*)    Hemoglobin 11.9 (*)    HCT 35.6 (*)    Platelets 113 (*) PLATELET COUNT CONFIRMED BY SMEAR   All other components within normal limits  BASIC METABOLIC PANEL - Abnormal; Notable for the following:    Sodium 133 (*)    Glucose, Bld 120 (*)    BUN 24 (*)    GFR calc non Af Amer 53 (*)    GFR calc Af Amer 62 (*)    All other components within normal limits  URINALYSIS, ROUTINE W REFLEX MICROSCOPIC - Abnormal; Notable for the following:    Ketones, ur TRACE (*)    All other components within normal limits  GLUCOSE, CAPILLARY - Abnormal; Notable for the following:    Glucose-Capillary 109 (*)    All other components within normal limits  POCT CBG MONITORING  PRO B NATRIURETIC PEPTIDE  CULTURE, BLOOD (ROUTINE X 2)  CULTURE, BLOOD (ROUTINE X 2)  TROPONIN I   Dg Chest 2 View  01/30/2011  *RADIOLOGY REPORT*  Clinical Data: Fall with cough and chest pain.  CHEST - 2 VIEW  Comparison: 01/28/2011 and CT chest 01/10/2011.  Findings: Trachea is midline.  Heart size stable.  Lungs are low in volume with diffuse bilateral air space disease.  No pleural fluid.  IMPRESSION: Low lung volumes with diffuse bilateral air space disease.  Edema or pneumonia could have this appearance.  Original Report Authenticated By: Reyes Ivan, M.D.   I personally reviewed the patient's chest x-ray  1. Healthcare-associated pneumonia       MDM  Deconditioning as  the cause of her falls over the past 6 months.  Family reports that the patient is not safe at home and they don't feel that she should go home.  Chest x-ray today  is concerning for pulmonary edema versus pneumonia.  Given her oral temp of 99.1 and her O2 sats of 88% he'll cover her for possible healthcare associated pneumonia.  She's been given vancomycin Zosyn.  Blood cultures been obtained.  Rectal temp is pending.  Her breathing and wheezing seem to be improved after albuterol and Atrovent.  Will admit for treatment, the patient will likely also require inpatient rehabilitation given her frequent falls and generalized weakness and failure to thrive.  6:18 PM Admit to [facility]. Spoke with Hospitalist         Lyanne Co, MD 01/30/11 1818  Lyanne Co, MD 01/30/11 [phone]

## 2011-01-31 DIAGNOSIS — D539 Nutritional anemia, unspecified: Secondary | ICD-10-CM | POA: Clinically undetermined

## 2011-01-31 DIAGNOSIS — R0902 Hypoxemia: Secondary | ICD-10-CM | POA: Diagnosis present

## 2011-01-31 DIAGNOSIS — I369 Nonrheumatic tricuspid valve disorder, unspecified: Secondary | ICD-10-CM

## 2011-01-31 LAB — CARDIAC PANEL(CRET KIN+CKTOT+MB+TROPI)
CK, MB: 2.9 ng/mL (ref 0.3–4.0)
CK, MB: 2.4 ng/mL (ref 0.3–4.0)
Relative Index: INVALID (ref 0.0–2.5)
Relative Index: INVALID (ref 0.0–2.5)
Total CK: 29 U/L (ref 7–177)
Troponin I: <0.3 ng/mL (ref <0.30)
Troponin I: <0.3 ng/mL (ref <0.30)

## 2011-01-31 LAB — CREATININE, SERUM
Creatinine, Ser: 0.92 mg/dL (ref 0.50–1.10)
GFR calc non Af Amer: 56 mL/min — ABNORMAL LOW (ref >90)

## 2011-01-31 LAB — URINE CULTURE
Colony Count: >=100000
Culture  Setup Time: 201211240114

## 2011-01-31 LAB — CBC
HCT: 35.1 % — ABNORMAL LOW (ref 36.0–46.0)
MCH: 31.6 pg (ref 26.0–34.0)
MCHC: 33 g/dL (ref 30.0–36.0)
RDW: 14.7 % (ref 11.5–15.5)

## 2011-01-31 LAB — PRO B NATRIURETIC PEPTIDE: Pro B Natriuretic peptide (BNP): 772 pg/mL — ABNORMAL HIGH (ref 0–450)

## 2011-01-31 LAB — LEGIONELLA ANTIGEN, URINE

## 2011-01-31 LAB — INFLUENZA PANEL BY PCR (TYPE A & B): H1N1 flu by pcr: NOT DETECTED

## 2011-01-31 LAB — FERRITIN: Ferritin: 216 ng/mL (ref 10–291)

## 2011-01-31 LAB — GLUCOSE, CAPILLARY: Glucose-Capillary: 200 mg/dL — ABNORMAL HIGH (ref 70–99)

## 2011-01-31 LAB — IRON AND TIBC
Iron: 15 ug/dL — ABNORMAL LOW (ref 42–135)
TIBC: 175 ug/dL — ABNORMAL LOW (ref 250–470)

## 2011-01-31 LAB — FOLATE: Folate: >20 ng/mL

## 2011-01-31 LAB — STREP PNEUMONIAE URINARY ANTIGEN: Strep Pneumo Urinary Antigen: NEGATIVE

## 2011-01-31 MED ORDER — ENOXAPARIN SODIUM 40 MG/0.4ML ~~LOC~~ SOLN: 40.0000 mg | SUBCUTANEOUS | Status: DC

## 2011-01-31 MED ORDER — FUROSEMIDE 10 MG/ML IJ SOLN
40.0000 mg | Freq: Two times a day (BID) | INTRAMUSCULAR | Status: AC
  Administered 2011-01-31 – 2011-02-01 (×2): 40 mg via INTRAVENOUS
  Filled 2011-01-31 (×3): qty 4

## 2011-01-31 NOTE — Progress Notes (Signed)
Subjective: Patient is alert, currently receiving a breathing treatment, patient states she feels better.  Objective: Vital signs in last 24 hours: Filed Vitals:   01/30/11 2106 01/31/11 0656 01/31/11 0925 01/31/11 1311  BP: 121/79 102/64    Pulse: 92 82    Temp: 98 F (36.7 C) 97.4 F (36.3 C)    TempSrc: Oral Oral    Resp: 20 16    Height: 5\' 2"  (1.575 m)     Weight: 79 kg (174 lb 2.6 oz)     SpO2: 91% 90% 94% 94%    Intake/Output Summary (Last 24 hours) at 01/31/11 1323 Last data filed at 01/31/11 0920  Gross per 24 hour  Intake    360 ml  Output    120 ml  Net    240 ml    Weight change:    General: Alert, awake, oriented x3, in no acute distress. Heart: Regular rate and rhythm, without murmurs, rubs, gallops. Lungs: Diffuse crackles, coarse BS. Abdomen: Soft, nontender, nondistended, positive bowel sounds. Extremities: No clubbing cyanosis or edema with positive pedal pulses. Neuro: Grossly intact, nonfocal.   Lab Results:  Basename 01/31/11 1026 01/30/11 1603 01/28/11 1800  NA -- 133* 135  K -- 4.1 4.0  CL -- 97 99  CO2 -- 27 29  GLUCOSE -- 120* 92  BUN -- 24* 23  CREATININE 0.92 0.96 --  CALCIUM -- 9.2 9.8  MG -- -- --  PHOS -- -- --   No results found for this basename: AST:2,ALT:2,ALKPHOS:2,BILITOT:2,PROT:2,ALBUMIN:2 in the last 72 hours No results found for this basename: LIPASE:2,AMYLASE:2 in the last 72 hours  Basename 01/31/11 1026 01/30/11 1603 01/28/11 1800  WBC 4.7 5.3 --  NEUTROABS -- -- 4.2  HGB 11.6* 11.9* --  HCT 35.1* 35.6* --  MCV 95.6 94.4 --  PLT 100* 113* --    Basename 01/30/11 1847  CKTOTAL --  CKMB --  CKMBINDEX --  TROPONINI <0.30    Basename 01/30/11 1603  POCBNP 1062.0*   No results found for this basename: DDIMER:2 in the last 72 hours No results found for this basename: HGBA1C:2 in the last 72 hours No results found for this basename: CHOL:2,HDL:2,LDLCALC:2,TRIG:2,CHOLHDL:2,LDLDIRECT:2 in the last 72  hours No results found for this basename: TSH,T4TOTAL,FREET3,T3FREE,THYROIDAB in the last 72 hours No results found for this basename: VITAMINB12:2,FOLATE:2,FERRITIN:2,TIBC:2,IRON:2,RETICCTPCT:2 in the last 72 hours  Micro Results: Recent Results (from the past 240 hour(s))  URINE CULTURE     Status: Normal   Collection Time   01/28/11  5:33 PM      Component Value Range Status Comment   Specimen Description URINE, CLEAN CATCH   Final    Special Requests NONE   Final    Setup Time [id number]   Final    Colony Count >=100,000 COLONIES/ML   Final    Culture ESCHERICHIA COLI   Final    Report Status 01/31/2011 FINAL   Final    Organism ID, Bacteria ESCHERICHIA COLI   Final     Studies/Results: Dg Chest 2 View  01/30/2011  *RADIOLOGY REPORT*  Clinical Data: Fall with cough and chest pain.  CHEST - 2 VIEW  Comparison: 01/28/2011 and CT chest 01/10/2011.  Findings: Trachea is midline.  Heart size stable.  Lungs are low in volume with diffuse bilateral air space disease.  No pleural fluid.  IMPRESSION: Low lung volumes with diffuse bilateral air space disease.  Edema or pneumonia could have this appearance.  Original Report Authenticated By: Joanna Hews.  Fredirick Lathe, M.D.    Medications:  Assessment: Active Problems:  Pneumonia  Diabetes mellitus  HTN (hypertension)  Polymyalgia rheumatica  Renal mass  Anemia  Hypoxia  #1 questionable healthcare associated pneumonia- clinical improvement. On admission patient was noted to have a fever, and cough. Patient's been placed on IV Vanco and Zosyn. Sputum cultures are pending Legionella and pneumococcus antigens are pending. Continue oxygen and nebs as needed, Mucinex. #2 hypoxia- patient was noted to be hypoxic felt to be likely secondary to problem #1 versus volume overload. Chest x-ray does have bilateral infiltrates questionable multifocal pneumonia versus edema. See problem #1 Will check a pro BNP, we'll cycle cardiac enzymes every 8 hours x3,  we'll place on IV Lasix. Repeat chest x-ray in the morning. Follow #3 COPD-stable. Patient on O2 antibiotics and nebs. #4 frequent falls-PT OT #5 diabetes mellitus-continue the left anemia and NovoLog.  #6 hypertension-stable #7 polymyalgia rheumatica-continue home dose prednisone #8 anemia-check an anemia panel, guaiac stools #9 renal mass-outpatient workup. #10 prophylaxis-Lovenox for DVT prophylaxis. Plan:    LOS: 1 day   THOMPSON,DANIEL 01/31/2011, 1:23 PM

## 2011-01-31 NOTE — Progress Notes (Signed)
CRITICAL VALUE ALERT  Critical value received:  Blood culture drawn 01-30-11 1 set gram negative Date of notification:  01-31-11  Time of notification:  1535  Critical value read back:  yes Nurse who received alert:  Destin Kittler  MD notified (1st page):  Dr. Janee Morn  Time of first page:  1606 MD notified (2nd page):  Time of second page:  Responding MD:  Dr. Janee Morn  Time MD responded:  [phone]

## 2011-01-31 NOTE — Progress Notes (Signed)
Physical Therapy Evaluation Patient Details Name: [name] MRN: [id number] DOB: [birth year] Today's Date: 01/31/2011 11:12-11:40, EV2  Problem List:  Patient Active Problem List  Diagnoses  . Pneumonia  . Diabetes mellitus  . HTN (hypertension)  . Polymyalgia rheumatica  . Depression  . Renal mass    Past Medical History:  Past Medical History  Diagnosis Date  . Diabetes mellitus   . HTN (hypertension)   . Diabetes mellitus   . Polymyalgia rheumatica   . Arthritis   . Cataract   . Osteoporosis   . Cancer    Past Surgical History:  Past Surgical History  Procedure Date  . Joint replacement   . Back surgery     PT Assessment/Plan/Recommendation PT Assessment Clinical Impression Statement: Pt admitted with pneumonia, but also reports having multiple falls, with most recent being 2 days prior to admission in which she visited the ED.  Pt reports her legs just gives way with gait.  Discussed d/c options with pt secondary to pt is home during the day while son is at work.  Pt would benefit from SNF after d/c from acute care if she is agreeable.  Pt would benefit from acute care PT to work on strength, balance, activity tolerance, safety, and functional mobility. PT Recommendation/Assessment: Patient will need skilled PT in the acute care venue PT Problem List: Decreased strength;Decreased balance;Decreased knowledge of use of DME;Decreased safety awareness;Cardiopulmonary status limiting activity;Decreased activity tolerance Barriers to Discharge: Decreased caregiver support Barriers to Discharge Comments: Pt home alone during the day by herself. PT Therapy Diagnosis : Difficulty walking;Generalized weakness PT Plan PT Frequency: Min 3X/week PT Treatment/Interventions: DME instruction;Gait training;Stair training;Functional mobility training;Therapeutic exercise;Balance training PT Recommendation Recommendations for Other Services: OT consult Follow Up  Recommendations: Skilled nursing facility;Home health PT;24 hour supervision/assistance (recommend SNF if pt is agreeable) Equipment Recommended: Defer to next venue PT Goals  Acute Rehab PT Goals PT Goal Formulation: With patient Time For Goal Achievement: 2 weeks Pt will go Supine/Side to Sit: with modified independence PT Goal: Supine/Side to Sit - Progress: Progressing toward goal Pt will go Sit to Supine/Side: with modified independence PT Goal: Sit to Supine/Side - Progress: Progressing toward goal Pt will Transfer Sit to Stand/Stand to Sit: with supervision PT Transfer Goal: Sit to Stand/Stand to Sit - Progress: Progressing toward goal Pt will Ambulate: >150 feet;with least restrictive assistive device;with supervision PT Goal: Ambulate - Progress: Progressing toward goal Pt will Go Up / Down Stairs: 3-5 stairs (if pt goes home.) PT Goal: Up/Down Stairs - Progress:  (not addressed)  PT Evaluation Precautions/Restrictions  Precautions Precautions: Fall Restrictions Weight Bearing Restrictions: No Prior Functioning  Home Living Lives With: [name] (son works during the day...pt alone) Receives Help From: Family Type of Home: Apartment Home Layout: One level Home Access: Stairs to enter Entrance Stairs-Rails: Right (son helps on other side) Entrance Stairs-Number of Steps: 4 Home Adaptive Equipment: Wheelchair - [employer] - rolling;Straight cane Prior Function Level of Independence: Requires assistive device for independence (uses RW outside and cane inside) Driving: Yes Vocation: Retired Financial risk analyst Arousal/Alertness: Awake/alert Overall Cognitive Status: Appears within functional limits for tasks assessed Orientation Level: Oriented to person;Oriented to time;Oriented to situation Sensation/Coordination   Extremity Assessment RLE Assessment RLE Assessment:  (4- to 4/5) LLE Assessment LLE Assessment:  (4- to 4/5) Mobility (including Balance) Bed  Mobility Left Sidelying to Sit: 3: Mod assist Transfers Sit to Stand: 4: Min assist Sit to Stand Details (indicate cue type and reason): vues  for hand placement Stand to Sit Details: poor safety.  letting go of RW too early and almost sitting on edge of chair. Ambulation/Gait Ambulation/Gait: Yes Ambulation/Gait Assistance: 4: Min assist Ambulation/Gait Assistance Details (indicate cue type and reason): cues to keep hands on RW Ambulation Distance (Feet): 15 Feet Assistive device: Rolling walker Gait Pattern: Step-to pattern  Static Sitting Balance Static Sitting - Level of Assistance: 4: Min assist (with LE MMTs pt leaning back) Exercise    End of Session PT - End of Session Equipment Utilized During Treatment: Gait belt Activity Tolerance: Patient tolerated treatment well Patient left: in chair Nurse Communication: Mobility status for transfers;Mobility status for ambulation General Behavior During Session: [facility] for tasks performed Cognition: [facility] for tasks performed  [facility] LUBECK 01/31/2011, 12:11 PM

## 2011-01-31 NOTE — Progress Notes (Signed)
*  PRELIMINARY RESULTS* Echocardiogram 2D Echocardiogram has been performed.  Glean Salen [facility] 01/31/2011, 4:26 PM

## 2011-02-01 ENCOUNTER — Inpatient Hospital Stay (HOSPITAL_COMMUNITY): Payer: Medicare Other

## 2011-02-01 DIAGNOSIS — E8770 Fluid overload, unspecified: Secondary | ICD-10-CM | POA: Diagnosis present

## 2011-02-01 LAB — CBC
MCH: 32 pg (ref 26.0–34.0)
MCHC: 33.6 g/dL (ref 30.0–36.0)
MCV: 95.1 fL (ref 78.0–100.0)
Platelets: 134 10*3/uL — ABNORMAL LOW (ref 150–400)
RDW: 14.7 % (ref 11.5–15.5)

## 2011-02-01 LAB — BASIC METABOLIC PANEL
BUN: 22 mg/dL (ref 6–23)
Calcium: 9.7 mg/dL (ref 8.4–10.5)
Creatinine, Ser: 1.05 mg/dL (ref 0.50–1.10)
GFR calc non Af Amer: 48 mL/min — ABNORMAL LOW (ref >90)
Glucose, Bld: 200 mg/dL — ABNORMAL HIGH (ref 70–99)
Sodium: 137 mEq/L (ref 135–145)

## 2011-02-01 LAB — CARDIAC PANEL(CRET KIN+CKTOT+MB+TROPI): Troponin I: <0.3 ng/mL (ref <0.30)

## 2011-02-01 LAB — GLUCOSE, CAPILLARY: Glucose-Capillary: 180 mg/dL — ABNORMAL HIGH (ref 70–99)

## 2011-02-01 MED ORDER — POLYETHYLENE GLYCOL 3350 17 G PO PACK
17.0000 g | PACK | Freq: Every day | ORAL | Status: DC
  Administered 2011-02-01: 23:00:00 via ORAL
  Administered 2011-02-02 – 2011-02-07 (×6): 17 g via ORAL
  Filled 2011-02-01 (×7): qty 1

## 2011-02-01 MED ORDER — POTASSIUM CHLORIDE CRYS ER 20 MEQ PO TBCR
40.0000 meq | EXTENDED_RELEASE_TABLET | Freq: Once | ORAL | Status: AC
  Administered 2011-02-01: 40 meq via ORAL
  Filled 2011-02-01: qty 2

## 2011-02-01 MED ORDER — METOPROLOL TARTRATE 12.5 MG HALF TABLET
12.5000 mg | ORAL_TABLET | Freq: Two times a day (BID) | ORAL | Status: DC
  Administered 2011-02-01 – 2011-02-07 (×12): 12.5 mg via ORAL
  Filled 2011-02-01 (×13): qty 1

## 2011-02-01 MED ORDER — FUROSEMIDE 10 MG/ML IJ SOLN
40.0000 mg | Freq: Two times a day (BID) | INTRAMUSCULAR | Status: DC
  Administered 2011-02-01: 10 mg via INTRAVENOUS
  Administered 2011-02-01: 21:00:00 via INTRAVENOUS
  Administered 2011-02-02: 40 mg via INTRAVENOUS
  Filled 2011-02-01 (×2): qty 4

## 2011-02-01 MED ORDER — FUROSEMIDE 10 MG/ML IJ SOLN
INTRAMUSCULAR | Status: AC
  Administered 2011-02-01: 10 mg via INTRAVENOUS
  Filled 2011-02-01: qty 8

## 2011-02-01 MED ORDER — DARIFENACIN HYDROBROMIDE ER 15 MG PO TB24
15.0000 mg | ORAL_TABLET | Freq: Every day | ORAL | Status: DC
  Administered 2011-02-01 – 2011-02-07 (×7): 15 mg via ORAL
  Filled 2011-02-01 (×7): qty 1

## 2011-02-01 MED ORDER — FUROSEMIDE 10 MG/ML IJ SOLN
60.0000 mg | Freq: Two times a day (BID) | INTRAMUSCULAR | Status: DC
  Administered 2011-02-01: 60 mg via INTRAVENOUS
  Filled 2011-02-01 (×3): qty 6

## 2011-02-01 MED ORDER — LEVOFLOXACIN 750 MG PO TABS
750.0000 mg | ORAL_TABLET | Freq: Every day | ORAL | Status: DC
  Administered 2011-02-01: 750 mg via ORAL
  Filled 2011-02-01 (×2): qty 1

## 2011-02-01 MED ORDER — ALBUTEROL SULFATE (5 MG/ML) 0.5% IN NEBU
INHALATION_SOLUTION | RESPIRATORY_TRACT | Status: AC
  Filled 2011-02-01: qty 0.5

## 2011-02-01 NOTE — Plan of Care (Signed)
Problem: Phase I Progression Outcomes Goal: First antibiotic given within 6hrs of admit Outcome: Completed/Met Date Met:  02/01/11 Zosyn given 1906 and vancomycin given 2250

## 2011-02-01 NOTE — Progress Notes (Signed)
TALKED TO PATIENT,(SON MELVIN AND DAUGHTER VIA TELEPHONE) ABOUT DCP; ALL ARE IN AGREEMENT FOR SNF PLACEMENT; PATIENT IS SAD BUT STATED THAT " I KNOW THAT I HAVE TO GO THERE"; EMOTIONAL SUPPORT GIVEN; SOCIAL WORKER CALLED FOR ARRANGEMENTS; B Saman Umstead RN, BSN, MHA.

## 2011-02-01 NOTE — Progress Notes (Signed)
CSW following for SNF placement. Full CSW eval and FL2 on chart. CSW will f/u with offers.  Dellie Burns, MSW, [employer] [phone] (Team 5 Cover)

## 2011-02-01 NOTE — Plan of Care (Signed)
Problem: Phase I Progression Outcomes Goal: First antibiotic given within 6hrs of admit Outcome: Completed/Met Date Met:  02/01/11 Blood cultures done at 1830

## 2011-02-01 NOTE — Progress Notes (Signed)
02/01/2011, 2:11 AM... Unable to collect sputum sample as pt is not coughing anything up... RN Marvia Pickles

## 2011-02-01 NOTE — Progress Notes (Signed)
Subjective: No complaints. Patient more alert today.  Objective: Vital signs in last 24 hours: Filed Vitals:   02/01/11 0700 02/01/11 0836 02/01/11 1412 02/01/11 1511  BP:   113/73   Pulse:   90   Temp: 97.4 F (36.3 C)  97.9 F (36.6 C)   TempSrc: Oral  Oral   Resp:   20   Height:      Weight:      SpO2:  98% 93% 93%    Intake/Output Summary (Last 24 hours) at 02/01/11 1954 Last data filed at 02/01/11 1722  Gross per 24 hour  Intake   1050 ml  Output   4200 ml  Net  -3150 ml    Weight change: -2.705 kg (-5 lb 15.4 oz)  General: Alert, awake, oriented x3, in no acute distress. HEENT: No bruits, no goiter. Heart: Regular rate and rhythm, without murmurs, rubs, gallops. Lungs:Coarse BS diffusely. Crackles  Abdomen: Soft, nontender, nondistended, positive bowel sounds. Extremities: No clubbing cyanosis or edema with positive pedal pulses. Neuro: Grossly intact, nonfocal.    Lab Results:  Basename 02/01/11 0515 01/31/11 1026 01/30/11 1603  NA 137 -- 133*  K 3.4* -- 4.1  CL 95* -- 97  CO2 33* -- 27  GLUCOSE 200* -- 120*  BUN 22 -- 24*  CREATININE 1.05 0.92 --  CALCIUM 9.7 -- 9.2  MG -- -- --  PHOS -- -- --   No results found for this basename: AST:2,ALT:2,ALKPHOS:2,BILITOT:2,PROT:2,ALBUMIN:2 in the last 72 hours No results found for this basename: LIPASE:2,AMYLASE:2 in the last 72 hours  Basename 02/01/11 0515 01/31/11 1026  WBC 6.7 4.7  NEUTROABS -- --  HGB 11.8* 11.6*  HCT 35.1* 35.1*  MCV 95.1 95.6  PLT 134* 100*    Basename 02/01/11 0515 01/31/11 2211 01/31/11 1347  CKTOTAL 23 29 32  CKMB 2.0 2.4 2.9  CKMBINDEX -- -- --  TROPONINI <0.30 <0.30 <0.30    Basename 02/01/11 1022 01/31/11 1348 01/30/11 1603  POCBNP 428.9 772.0* 1062.0*   No results found for this basename: DDIMER:2 in the last 72 hours No results found for this basename: HGBA1C:2 in the last 72 hours No results found for this basename:  CHOL:2,HDL:2,LDLCALC:2,TRIG:2,CHOLHDL:2,LDLDIRECT:2 in the last 72 hours No results found for this basename: TSH,T4TOTAL,FREET3,T3FREE,THYROIDAB in the last 72 hours  Basename 01/31/11 1349  VITAMINB12 1547*  FOLATE >20.0  FERRITIN 216  TIBC 175*  IRON 15*  RETICCTPCT --    Micro Results: Recent Results (from the past 240 hour(s))  URINE CULTURE     Status: Normal   Collection Time   01/28/11  5:33 PM      Component Value Range Status Comment   Specimen Description URINE, CLEAN CATCH   Final    Special Requests NONE   Final    Setup Time [id number]   Final    Colony Count >=100,000 COLONIES/ML   Final    Culture ESCHERICHIA COLI   Final    Report Status 01/31/2011 FINAL   Final    Organism ID, Bacteria ESCHERICHIA COLI   Final   CULTURE, BLOOD (ROUTINE X 2)     Status: Normal (Preliminary result)   Collection Time   01/30/11  6:30 PM      Component Value Range Status Comment   Specimen Description BLOOD LEFT ARM  5 ML IN [facility] BOTTLE   Final    Special Requests NONE   Final    Setup Time [id number]   Final    Culture  Final    Value:        BLOOD CULTURE RECEIVED NO GROWTH TO DATE CULTURE WILL BE HELD FOR 5 DAYS BEFORE ISSUING A FINAL NEGATIVE REPORT   Report Status PENDING   Incomplete   CULTURE, BLOOD (ROUTINE X 2)     Status: Normal (Preliminary result)   Collection Time   01/30/11  6:47 PM      Component Value Range Status Comment   Specimen Description BLOOD LEFT FOREARM  5 ML IN [facility] BOTTLE   Final    Special Requests NONE   Final    Setup Time [id number]   Final    Culture     Final    Value: GRAM NEGATIVE RODS     Note: Gram Stain Report Called to,Read Back By and Verified WithOtho Najjar @ 8119 01/31/11 WICKN   Report Status PENDING   Incomplete     Studies/Results: Dg Chest Port 1 View  02/01/2011  *RADIOLOGY REPORT*  Clinical Data: Pneumonia versus edema.  PORTABLE CHEST - 1 VIEW  Comparison: 01/30/2011  Findings: The patchy bilateral  airspace disease again noted, possibly slightly increased since prior study.  This is likely superimposed on underlying chronic lung disease.  Heart is borderline in size.  No effusions.  IMPRESSION: Slight interval worsening patchy bilateral airspace disease.  Original Report Authenticated By: Cyndie Chime, M.D.    Medications:     . albuterol  2.5 mg Nebulization Q6H  . albuterol      . amitriptyline  50 mg Oral QHS  . aspirin EC  325 mg Oral Daily  . darifenacin  15 mg Oral Daily  . enoxaparin  40 mg Subcutaneous QHS  . furosemide      . furosemide  40 mg Intravenous Q12H  . furosemide  60 mg Intravenous Q12H  . gabapentin  600 mg Oral TID  . guaiFENesin  600 mg Oral BID  . insulin aspart  0-5 Units Subcutaneous QHS  . insulin aspart  0-9 Units Subcutaneous TID WC  . insulin aspart  3 Units Subcutaneous TID WC  . insulin detemir  32 Units Subcutaneous QHS  . ipratropium  0.5 mg Nebulization Q6H  . multivitamins ther. w/minerals  1 tablet Oral Daily  . piperacillin-tazobactam (ZOSYN)  IV  3.375 g Intravenous Q8H  . polyethylene glycol  17 g Oral Daily  . potassium chloride  40 mEq Oral Once  . potassium chloride  40 mEq Oral Once  . predniSONE  3 mg Oral Daily  . sodium chloride  3 mL Intravenous Q12H  . vancomycin  750 mg Intravenous Q12H  . DISCONTD: solifenacin  10 mg Oral Daily   Assessment/Plan Principal Problem:  *Volume overload/ Diastolic CHF Active Problems:  Pneumonia  Diabetes mellitus  HTN (hypertension)  Polymyalgia rheumatica  Renal mass  Anemia  Hypoxia  #1. Volume overload/ Acute Diastolic CHF  Unknown etiology. Cardiac enzymes negative x 3. 2 d echo with EF 65-70%. Mild LVH. I/O-1540. Slowly improving clinically. BNP trending down. Continue IV lasix. ASA. Will start low dose beta blocker. Follow #2. Hypoxia Likely secondary to #1. Urine legionella and pneumococcus antigen negative. Patient being treated empirically for PNA. ESR elevated. Repeat CXR  in AM. Continue IV lasix, and change antibiotics to levaquin. Follow. If CXR worsening or patient worsening may consider pulm consult. #3. [facility] PNA Doubt if patient has PNA. Will change antibiotics to oral levaquin and treat empirically for 5 days . Follow. #4. HTN stable. Continue  current regimen. #5. COPD - Stable. Continue oxygen, change antibiotics to oral levaquin, nebs prn #6. PMR- prednisone #7. Anemia- secondary to AOCD/iron def anemia. Follow H/H #8. Falls- CT head. PT/OT. Needs SNF #9 DM - [facility]. SSI    LOS: 2 days   [facility] 02/01/2011, 7:54 PM

## 2011-02-02 ENCOUNTER — Inpatient Hospital Stay (HOSPITAL_COMMUNITY): Payer: Medicare Other

## 2011-02-02 LAB — CBC
Hemoglobin: 13 g/dL (ref 12.0–15.0)
MCH: 31 pg (ref 26.0–34.0)
MCV: 94 fL (ref 78.0–100.0)
Platelets: 177 10*3/uL (ref 150–400)
RBC: 4.19 MIL/uL (ref 3.87–5.11)
WBC: 8.3 10*3/uL (ref 4.0–10.5)

## 2011-02-02 LAB — BASIC METABOLIC PANEL
CO2: 35 mEq/L — ABNORMAL HIGH (ref 19–32)
Glucose, Bld: 188 mg/dL — ABNORMAL HIGH (ref 70–99)
Potassium: 3.6 mEq/L (ref 3.5–5.1)
Sodium: 139 mEq/L (ref 135–145)

## 2011-02-02 LAB — GLUCOSE, CAPILLARY

## 2011-02-02 MED ORDER — LEVOFLOXACIN 750 MG PO TABS
750.0000 mg | ORAL_TABLET | ORAL | Status: DC
  Administered 2011-02-03: 750 mg via ORAL
  Filled 2011-02-02: qty 1

## 2011-02-02 MED ORDER — FUROSEMIDE 40 MG PO TABS
40.0000 mg | ORAL_TABLET | Freq: Two times a day (BID) | ORAL | Status: DC
  Administered 2011-02-02 – 2011-02-03 (×3): 40 mg via ORAL
  Filled 2011-02-02 (×5): qty 1

## 2011-02-02 MED ORDER — BENZONATATE 100 MG PO CAPS
100.0000 mg | ORAL_CAPSULE | Freq: Three times a day (TID) | ORAL | Status: DC
  Administered 2011-02-02 – 2011-02-07 (×15): 100 mg via ORAL
  Filled 2011-02-02 (×17): qty 1

## 2011-02-02 MED ORDER — DM-GUAIFENESIN ER 30-600 MG PO TB12
2.0000 | ORAL_TABLET | Freq: Two times a day (BID) | ORAL | Status: DC
  Administered 2011-02-02 – 2011-02-07 (×10): 2 via ORAL
  Filled 2011-02-02 (×11): qty 2

## 2011-02-02 NOTE — Clinical Documentation Improvement (Signed)
RESPIRATORY FAILURE DOCUMENTATION CLARIFICATION QUERY   THIS DOCUMENT IS NOT A PERMANENT PART OF THE MEDICAL RECORD  Please update your documentation within the medical record to reflect your response to this query.                                                                                     02/02/11  Dear Bobby Rumpf Marton Redwood,  In a better effort to capture your patient's severity of illness, reflect appropriate length of stay and utilization of resources, a review of the patient medical record has revealed the following indicators.   Based on your clinical judgment, please clarify and document in a progress note and/or discharge summary the clinical condition associated with the following supporting information: In responding to this query please exercise your independent judgment.  The fact that a query is asked, does not imply that any particular answer is desired or expected.   Please clarify whether or not pt with hypoxia condition can be further specified with list below. Thank you for your exceptional documentation. Possible Clinical Conditions?  _______Acute Respiratory Failure _______Acute on Chronic Respiratory Failure _______Chronic Respiratory Failure _______Acute Respiratory Insufficiency  _______Other Condition________________ _______Cannot Clinically Determine    Clinical Information:  Risk Factors: Pneumonia,   Signs&Symptoms :In ED : O2 sats 80% on room air,    per pn dated 11/26 : hypoxia/ Ambulation/Gait Assistance Details : oxygen on at 3 L/min. O2 at 89% at end of gait. 95% after 5 mins and pursed lip breathing education  Radiology: 11/25 CHEST - 2 VIEW IMPRESSION: Low lung volumes with diffuse bilateral air space disease. Edema or pneumonia could have this appearance  Respiratory Treatment: oxygen, albuterol , Atrovent                           You may use possible, probable, or suspect with inpatient documentation. possible, probable,  suspected diagnoses MUST be documented at the time of discharge  Reviewed:  no additional documentation provided  Thank You,  Sincerely, Andy Gauss RN   Clinical Documentation Specialist:  Pager [phone]  Health Information Management [employer]   TO RESPOND TO THE THIS QUERY, FOLLOW THE INSTRUCTIONS BELOW:  1. If needed, update documentation for the patient's encounter via the notes activity.  2. Access this query again and click edit on the [employer].  3. After updating, or not, click F2 to complete all highlighted (required) fields concerning your review. Select "additional documentation in the medical record" OR "no additional documentation provided".  4. Click Sign note button.  5. The deficiency will fall out of your InBasket *Please let us know if you are not able to compete this workflow by phone or e-mail (listed below).

## 2011-02-02 NOTE — Progress Notes (Addendum)
Subjective: States her breathing better but cough persisting,nonproductive.  Objective: Vital signs in last 24 hours: Filed Vitals:   02/01/11 2010 02/01/11 2100 02/02/11 0547 02/02/11 0805  BP:  129/87 110/79   Pulse:  89 93   Temp:  99.1 F (37.3 Owens) 98.7 F (37.1 Owens)   TempSrc:  Oral Oral   Resp:  18 18   Height:      Weight:      SpO2: 94% 95% 94% 98%    Intake/Output Summary (Last 24 hours) at 02/02/11 1245 Last data filed at 02/02/11 0548  Gross per 24 hour  Intake    560 ml  Output   2550 ml  Net  -1990 ml    Weight change:   General: Alert, awake, oriented x3, in no acute distress. HEENT: No bruits, no goiter. Heart: Regular rate and rhythm, without murmurs, rubs, gallops. Lungs:Coarse BS diffusely, occasional wheeze.  Abdomen: Soft, nontender, nondistended, positive bowel sounds. Extremities: No clubbing cyanosis or edema with positive pedal pulses. Neuro: Grossly intact, nonfocal.    Lab Results:  [facility] 02/02/11 0515 02/01/11 0515  NA 139 137  K 3.6 3.4*  CL 90* 95*  CO2 35* 33*  GLUCOSE 188* 200*  BUN 23 22  CREATININE 1.24* 1.05  CALCIUM 10.3 9.7  MG -- --  PHOS -- --   No results found for this basename: AST:2,ALT:2,ALKPHOS:2,BILITOT:2,PROT:2,ALBUMIN:2 in the last 72 hours No results found for this basename: LIPASE:2,AMYLASE:2 in the last 72 hours  Basename 02/02/11 0515 02/01/11 0515  WBC 8.3 6.7  NEUTROABS -- --  HGB 13.0 11.8*  HCT 39.4 35.1*  MCV 94.0 95.1  PLT 177 134*    Basename 02/01/11 0515 01/31/11 2211 01/31/11 1347  CKTOTAL 23 29 32  CKMB 2.0 2.4 2.9  CKMBINDEX -- -- --  TROPONINI <0.30 <0.30 <0.30    Basename 02/02/11 0515 02/01/11 1022 01/31/11 1348  POCBNP 358.2 428.9 772.0*   No results found for this basename: DDIMER:2 in the last 72 hours No results found for this basename: HGBA1C:2 in the last 72 hours No results found for this basename: CHOL:2,HDL:2,LDLCALC:2,TRIG:2,CHOLHDL:2,LDLDIRECT:2 in the last 72  hours No results found for this basename: TSH,T4TOTAL,FREET3,T3FREE,THYROIDAB in the last 72 hours  Basename 01/31/11 1349  VITAMINB12 1547*  FOLATE >20.0  FERRITIN 216  TIBC 175*  IRON 15*  RETICCTPCT --    Micro Results: Recent Results (from the past 240 hour(s))  URINE CULTURE     Status: Normal   Collection Time   01/28/11  5:33 PM      Component Value Range Status Comment   Specimen Description URINE, CLEAN CATCH   Final    Special Requests NONE   Final    Setup Time [id number]   Final    Colony Count >=100,000 COLONIES/ML   Final    Culture ESCHERICHIA COLI   Final    Report Status 01/31/2011 FINAL   Final    Organism ID, Bacteria ESCHERICHIA COLI   Final   CULTURE, BLOOD (ROUTINE X 2)     Status: Normal (Preliminary result)   Collection Time   01/30/11  6:30 PM      Component Value Range Status Comment   Specimen Description BLOOD LEFT ARM  5 ML IN [facility] BOTTLE   Final    Special Requests NONE   Final    Setup Time [id number]   Final    Culture     Final    Value:  BLOOD CULTURE RECEIVED NO GROWTH TO DATE CULTURE WILL BE HELD FOR 5 DAYS BEFORE ISSUING A FINAL NEGATIVE REPORT   Report Status PENDING   Incomplete   CULTURE, BLOOD (ROUTINE X 2)     Status: Normal (Preliminary result)   Collection Time   01/30/11  6:47 PM      Component Value Range Status Comment   Specimen Description BLOOD LEFT FOREARM  5 ML IN [facility] BOTTLE   Final    Special Requests NONE   Final    Setup Time [id number]   Final    Culture     Final    Value: GRAM NEGATIVE RODS     Note: Gram Stain Report Called to,Read Back By and Verified WithOtho Najjar @ 1535 01/31/11 WICKN   Report Status PENDING   Incomplete     Studies/Results: Ct Head Wo Contrast  02/01/2011  *RADIOLOGY REPORT*  Clinical Data: Altered mental status; assess for infarct.  CT HEAD WITHOUT CONTRAST  Technique:  Contiguous axial images were obtained from the base of the skull through the vertex without  contrast.  Comparison: CT of the head performed 01/28/2011  Findings: There is no evidence of acute infarction, mass lesion, or intra- or extra-axial hemorrhage on CT.  Evaluation is mildly suboptimal due to motion artifact.  Periventricular white matter change likely reflects small vessel ischemic microangiopathy.  Mild cerebellar atrophy is noted.  The brainstem and fourth ventricle are within normal limits.  The third and lateral ventricles, and basal ganglia are unremarkable in appearance.  The cerebral hemispheres demonstrate grossly normal gray-white differentiation.  No mass effect or midline shift is seen.  There is no evidence of fracture; visualized osseous structures are unremarkable in appearance.  The orbits are within normal limits. There is complete opacification of the left maxillary sinus, and chronic mucoperiosteal thickening at the left mastoid process; the remaining visualized paranasal sinuses and right mastoid air cells are well-aerated.  No significant soft tissue abnormalities are seen.  IMPRESSION:  1.  No acute intracranial pathology seen on CT.  If there is significant clinical concern for acute infarct, MRI could be considered for further evaluation. 2.  Mild small vessel ischemic microangiopathy. 3.  Complete opacification of the left maxillary sinus, and chronic mucoperiosteal thickening at the left mastoid process.  Original Report Authenticated By: Tonia Ghent, M.D.   Dg Chest Port 1 View  02/02/2011  *RADIOLOGY REPORT*  Clinical Data: 77 year-old female with shortness of breath and pulmonary edema.  PORTABLE CHEST - 1 VIEW  Comparison: 02/01/2011  Findings: Upper limits normal heart size again identified. Decreased bilateral airspace/interstitial opacities likely represent improved pulmonary edema. There may be tiny bilateral pleural effusions present. There is no evidence of pneumothorax. Minimal patchy opacities in the left lower lung are stable. No other interval change  identified.  IMPRESSION: Decreased pulmonary edema.  No other significant change.  Original Report Authenticated By: Rosendo Gros, M.D.   Dg Chest Port 1 View  02/01/2011  *RADIOLOGY REPORT*  Clinical Data: Pneumonia versus edema.  PORTABLE CHEST - 1 VIEW  Comparison: 01/30/2011  Findings: The patchy bilateral airspace disease again noted, possibly slightly increased since prior study.  This is likely superimposed on underlying chronic lung disease.  Heart is borderline in size.  No effusions.  IMPRESSION: Slight interval worsening patchy bilateral airspace disease.  Original Report Authenticated By: Cyndie Chime, M.D.    Medications:     . albuterol  2.5 mg Nebulization Q6H  .  amitriptyline  50 mg Oral QHS  . aspirin EC  325 mg Oral Daily  . darifenacin  15 mg Oral Daily  . enoxaparin  40 mg Subcutaneous QHS  . furosemide  40 mg Oral BID  . gabapentin  600 mg Oral TID  . guaiFENesin  600 mg Oral BID  . insulin aspart  0-5 Units Subcutaneous QHS  . insulin aspart  0-9 Units Subcutaneous TID WC  . insulin aspart  3 Units Subcutaneous TID WC  . insulin detemir  32 Units Subcutaneous QHS  . ipratropium  0.5 mg Nebulization Q6H  . levofloxacin  750 mg Oral QHS  . metoprolol tartrate  12.5 mg Oral BID  . multivitamins ther. w/minerals  1 tablet Oral Daily  . polyethylene glycol  17 g Oral Daily  . potassium chloride  40 mEq Oral Once  . predniSONE  3 mg Oral Daily  . sodium chloride  3 mL Intravenous Q12H  . DISCONTD: furosemide  40 mg Intravenous Q12H  . DISCONTD: furosemide  60 mg Intravenous Q12H  . DISCONTD: piperacillin-tazobactam (ZOSYN)  IV  3.375 g Intravenous Q8H  . DISCONTD: vancomycin  750 mg Intravenous Q12H   Assessment/Plan  #1. Volume overload/ Acute Diastolic CHF  Unknown etiology. Cardiac enzymes negative x 3. 2 d echo with EF 65-70%. Mild LVH. I/O-1540. Slowly improving clinically. BNP trending down. Continue ASA and low dose beta blocker. Will change to po lasix  as cr beginning to trend up. Repeat chest x-ray with improving of pulmonary edema, will follow #2. COPD - Stable. Continue oxygen, oral antibioticand nebs prn  #3.? PNA Doubt if patient has PNA. on oral levaquin, will treat empirically for 5 days . Urine legionella and pneumococcus antigen negative. Will continue Mucinex and add Tessalon pearls for continued cough #3. Hypoxia Likely secondary to above, continue management as above. #4. HTN stable. Continue current regimen. #6. PMR- prednisone #7. Anemia- secondary to AOCD/iron def anemia.  #8. Falls- CT head. PT/OT. Needs SNF #9 DM - [facility], SSI #10 Renal mass- out pt f/u with urology.   LOS: 3 days   [name] 02/02/2011, 12:45 PM

## 2011-02-02 NOTE — Progress Notes (Signed)
Physical Therapy Treatment Patient Details Name: [name] MRN: [id number] DOB: 1924-05-18 Today's Date: 02/02/2011 10:15-10:38, gt 2 PT Assessment/Plan  PT - Assessment/Plan Comments on Treatment Session: Pt with poor safety and slightly impulsive with transfers.  A 2nd person was present to bring chair and o2 tank secondary to pt's safety awareness. PT Plan: Discharge plan remains appropriate;Frequency remains appropriate Follow Up Recommendations: Skilled nursing facility Equipment Recommended: Defer to next venue PT Goals  Acute Rehab PT Goals PT Goal: Supine/Side to Sit - Progress: Progressing toward goal PT Transfer Goal: Sit to Stand/Stand to Sit - Progress: Progressing toward goal PT Goal: Ambulate - Progress: Progressing toward goal  PT Treatment Precautions/Restrictions  Precautions Precautions: Fall Precaution Comments: pt had episodes of falls at home recently Restrictions Weight Bearing Restrictions: No Mobility (including Balance) Bed Mobility Left Sidelying to Sit: 3: Mod assist Left Sidelying to Sit Details (indicate cue type and reason): Pt able to initiate transfer, but then required MOD A for trunk Transfers Sit to Stand: 4: Min assist;From bed;From toilet Sit to Stand Details (indicate cue type and reason): cues for hand placement Stand to Sit: 4: Min assist (MIN/guard) Stand to Sit Details: hand placement, pt wanting to let go of RW too early. Ambulation/Gait Ambulation/Gait Assistance: 4: Min assist Ambulation/Gait Assistance Details (indicate cue type and reason): 2 episodes of knees buckleing.  oxygen on at 3 L/min.  o2 at 89% at end of gait.  95% after 5 mins and pursed lip breathing education Ambulation Distance (Feet): 35 Feet Assistive device: Rolling walker Gait Pattern: Step-to pattern (flexed posture) Gait velocity: slow    Exercise    End of Session PT - End of Session Equipment Utilized During Treatment: Gait belt Activity  Tolerance: Patient limited by fatigue Patient left: in chair Nurse Communication: Mobility status for transfers;Mobility status for ambulation General Behavior During Session: [facility] for tasks performed Cognition: [facility] for tasks performed  [facility] LUBECK 02/02/2011, 12:06 PM

## 2011-02-02 NOTE — Progress Notes (Signed)
CARE MANAGEMENT NOTE 02/02/2011  Patient:  [name], [name]   Account Number:  [id number]  Date Initiated:  02/02/2011  Documentation initiated by:  Susie Pousson  Subjective/Objective Assessment:   pt with recent hx of pnumonnia still running fevers cxr clear has been on po abx at home now present with frquent falls     Action/Plan:   lives at home does not want to go to skilled inpatient rehab.   Anticipated DC Date:  02/05/2011   Anticipated DC Plan:  HOME W HOME HEALTH SERVICES  In-house referral  NA      DC Planning Services  NA      [facility] Choice  NA   Choice offered to / List presented to:  NA   DME arranged  NA      DME agency  NA     HH arranged  NA      Status of service:  In process, will continue to follow Medicare Important Message given?   (If response is "NO", the following Medicare IM given date fields will be blank) Date Medicare IM given:   Date Additional Medicare IM given:    Discharge Disposition:    Per UR Regulation:  Reviewed for med. necessity/level of care/duration of stay  Comments:  [id number]/Yvetta Drotar Earlene Plater, RN, BSN, CCM/CHART REVIEW FOR UR PERFORMED.

## 2011-02-03 ENCOUNTER — Inpatient Hospital Stay (HOSPITAL_COMMUNITY): Payer: Medicare Other

## 2011-02-03 LAB — GLUCOSE, CAPILLARY
Glucose-Capillary: 145 mg/dL — ABNORMAL HIGH (ref 70–99)
Glucose-Capillary: 166 mg/dL — ABNORMAL HIGH (ref 70–99)

## 2011-02-03 LAB — CULTURE, BLOOD (ROUTINE X 2)

## 2011-02-03 LAB — BASIC METABOLIC PANEL
BUN: 32 mg/dL — ABNORMAL HIGH (ref 6–23)
CO2: 39 mEq/L — ABNORMAL HIGH (ref 19–32)
Chloride: 86 mEq/L — ABNORMAL LOW (ref 96–112)
Creatinine, Ser: 1.04 mg/dL (ref 0.50–1.10)
Potassium: 4 mEq/L (ref 3.5–5.1)

## 2011-02-03 MED ORDER — DEXTROSE 5 % IV SOLN
1.0000 g | INTRAVENOUS | Status: DC
  Administered 2011-02-03 – 2011-02-06 (×4): 1 g via INTRAVENOUS
  Filled 2011-02-03 (×5): qty 10

## 2011-02-03 MED ORDER — AZITHROMYCIN 500 MG PO TABS
500.0000 mg | ORAL_TABLET | Freq: Every day | ORAL | Status: DC
  Administered 2011-02-03 – 2011-02-07 (×5): 500 mg via ORAL
  Filled 2011-02-03 (×6): qty 1

## 2011-02-03 MED ORDER — ALBUTEROL SULFATE (5 MG/ML) 0.5% IN NEBU
INHALATION_SOLUTION | RESPIRATORY_TRACT | Status: AC
  Administered 2011-02-03: 2.5 mg via RESPIRATORY_TRACT
  Filled 2011-02-03: qty 0.5

## 2011-02-03 MED ORDER — FUROSEMIDE 40 MG PO TABS
40.0000 mg | ORAL_TABLET | Freq: Two times a day (BID) | ORAL | Status: DC
  Administered 2011-02-03 – 2011-02-04 (×3): 40 mg via ORAL
  Filled 2011-02-03 (×6): qty 1

## 2011-02-03 NOTE — Plan of Care (Signed)
Problem: Phase I Progression Outcomes Goal: OOB as tolerated unless otherwise ordered Outcome: Progressing oob to chair with assist

## 2011-02-03 NOTE — Progress Notes (Signed)
Subjective: Cough better today, states she feels better overall  Objective: Vital signs in last 24 hours: Filed Vitals:   02/03/11 0547 02/03/11 0740 02/03/11 1430 02/03/11 1455  BP:   105/83   Pulse:   87   Temp:   98.4 F (36.9 C)   TempSrc:   Oral   Resp:   18   Height:      Weight:      SpO2: 92% 95% 96% 95%    Intake/Output Summary (Last 24 hours) at 02/03/11 1626 Last data filed at 02/03/11 1327  Gross per 24 hour  Intake    480 ml  Output    300 ml  Net    180 ml    Weight change:   General: Alert, awake, in no acute distress. HEENT: No bruits, no goiter. Heart: Regular rate and rhythm, without murmurs, rubs, gallops. Lungs:decreased BS at bases,no wheezes.  Abdomen: Soft, nontender, nondistended, positive bowel sounds. Extremities: No clubbing cyanosis or edema  Neuro: Grossly intact, nonfocal.    Lab Results:  [facility] 02/03/11 0535 02/02/11 0515  NA 134* 139  K 4.0 3.6  CL 86* 90*  CO2 39* 35*  GLUCOSE 131* 188*  BUN 32* 23  CREATININE 1.04 1.24*  CALCIUM 10.1 10.3  MG -- --  PHOS -- --   No results found for this basename: AST:2,ALT:2,ALKPHOS:2,BILITOT:2,PROT:2,ALBUMIN:2 in the last 72 hours No results found for this basename: LIPASE:2,AMYLASE:2 in the last 72 hours  Basename 02/02/11 0515 02/01/11 0515  WBC 8.3 6.7  NEUTROABS -- --  HGB 13.0 11.8*  HCT 39.4 35.1*  MCV 94.0 95.1  PLT 177 134*    Basename 02/01/11 0515 01/31/11 2211  CKTOTAL 23 29  CKMB 2.0 2.4  CKMBINDEX -- --  TROPONINI <0.30 <0.30    Basename 02/02/11 0515 02/01/11 1022  POCBNP 358.2 428.9   No results found for this basename: DDIMER:2 in the last 72 hours No results found for this basename: HGBA1C:2 in the last 72 hours No results found for this basename: CHOL:2,HDL:2,LDLCALC:2,TRIG:2,CHOLHDL:2,LDLDIRECT:2 in the last 72 hours No results found for this basename: TSH,T4TOTAL,FREET3,T3FREE,THYROIDAB in the last 72 hours No results found for this basename:  VITAMINB12:2,FOLATE:2,FERRITIN:2,TIBC:2,IRON:2,RETICCTPCT:2 in the last 72 hours  Micro Results: Recent Results (from the past 240 hour(s))  URINE CULTURE     Status: Normal   Collection Time   01/28/11  5:33 PM      Component Value Range Status Comment   Specimen Description URINE, CLEAN CATCH   Final    Special Requests NONE   Final    Setup Time [id number]   Final    Colony Count >=100,000 COLONIES/ML   Final    Culture ESCHERICHIA COLI   Final    Report Status 01/31/2011 FINAL   Final    Organism ID, Bacteria ESCHERICHIA COLI   Final   CULTURE, BLOOD (ROUTINE X 2)     Status: Normal (Preliminary result)   Collection Time   01/30/11  6:30 PM      Component Value Range Status Comment   Specimen Description BLOOD LEFT ARM  5 ML IN [facility] BOTTLE   Final    Special Requests NONE   Final    Setup Time [id number]   Final    Culture     Final    Value:        BLOOD CULTURE RECEIVED NO GROWTH TO DATE CULTURE WILL BE HELD FOR 5 DAYS BEFORE ISSUING A FINAL NEGATIVE REPORT   Report  Status PENDING   Incomplete   CULTURE, BLOOD (ROUTINE X 2)     Status: Normal   Collection Time   01/30/11  6:47 PM      Component Value Range Status Comment   Specimen Description BLOOD LEFT FOREARM  5 ML IN [facility] BOTTLE   Final    Special Requests NONE   Final    Setup Time [id number]   Final    Culture     Final    Value: ACINETOBACTER LWOFFII     Note: Gram Stain Report Called to,Read Back By and Verified WithOtho Najjar @ 1535 01/31/11 WICKN   Report Status 02/03/2011 FINAL   Final    Organism ID, Bacteria ACINETOBACTER LWOFFII   Final     Studies/Results: Ct Head Wo Contrast  02/01/2011  *RADIOLOGY REPORT*  Clinical Data: Altered mental status; assess for infarct.  CT HEAD WITHOUT CONTRAST  Technique:  Contiguous axial images were obtained from the base of the skull through the vertex without contrast.  Comparison: CT of the head performed 01/28/2011  Findings: There is no evidence of acute  infarction, mass lesion, or intra- or extra-axial hemorrhage on CT.  Evaluation is mildly suboptimal due to motion artifact.  Periventricular white matter change likely reflects small vessel ischemic microangiopathy.  Mild cerebellar atrophy is noted.  The brainstem and fourth ventricle are within normal limits.  The third and lateral ventricles, and basal ganglia are unremarkable in appearance.  The cerebral hemispheres demonstrate grossly normal gray-white differentiation.  No mass effect or midline shift is seen.  There is no evidence of fracture; visualized osseous structures are unremarkable in appearance.  The orbits are within normal limits. There is complete opacification of the left maxillary sinus, and chronic mucoperiosteal thickening at the left mastoid process; the remaining visualized paranasal sinuses and right mastoid air cells are well-aerated.  No significant soft tissue abnormalities are seen.  IMPRESSION:  1.  No acute intracranial pathology seen on CT.  If there is significant clinical concern for acute infarct, MRI could be considered for further evaluation. 2.  Mild small vessel ischemic microangiopathy. 3.  Complete opacification of the left maxillary sinus, and chronic mucoperiosteal thickening at the left mastoid process.  Original Report Authenticated By: Tonia Ghent, M.D.   Dg Chest Port 1 View  02/03/2011  *RADIOLOGY REPORT*  Clinical Data: Evaluate infiltrates  PORTABLE CHEST - 1 VIEW  Comparison: Aug 02, 2010; 02/01/2011; 01/30/2011  Findings: Grossly unchanged cardiac silhouette and mediastinal contours. Grossly unchanged ill-defined heterogeneous interstitial opacities within the right upper and left mid lung.  No new focal airspace opacities.  No pleural effusion or pneumothorax. Unchanged bones including lower cervical ACDF, incompletely evaluated.  IMPRESSION:  Grossly unchanged right upper and mid lung heterogeneous interstitial opacities, possibly areas of asymmetric  pulmonary edema though atypical infection is not excluded.  Original Report Authenticated By: Waynard Reeds, M.D.   Dg Chest Port 1 View  02/02/2011  *RADIOLOGY REPORT*  Clinical Data: 77 year-old female with shortness of breath and pulmonary edema.  PORTABLE CHEST - 1 VIEW  Comparison: 02/01/2011  Findings: Upper limits normal heart size again identified. Decreased bilateral airspace/interstitial opacities likely represent improved pulmonary edema. There may be tiny bilateral pleural effusions present. There is no evidence of pneumothorax. Minimal patchy opacities in the left lower lung are stable. No other interval change identified.  IMPRESSION: Decreased pulmonary edema.  No other significant change.  Original Report Authenticated By: Rosendo Gros, M.D.  Medications:     . albuterol  2.5 mg Nebulization Q6H  . amitriptyline  50 mg Oral QHS  . aspirin EC  325 mg Oral Daily  . benzonatate  100 mg Oral TID  . darifenacin  15 mg Oral Daily  . dextromethorphan-guaiFENesin  2 tablet Oral BID  . enoxaparin  40 mg Subcutaneous QHS  . furosemide  40 mg Oral BID  . gabapentin  600 mg Oral TID  . insulin aspart  0-5 Units Subcutaneous QHS  . insulin aspart  0-9 Units Subcutaneous TID WC  . insulin aspart  3 Units Subcutaneous TID WC  . insulin detemir  32 Units Subcutaneous QHS  . ipratropium  0.5 mg Nebulization Q6H  . levofloxacin  750 mg Oral Q48H  . metoprolol tartrate  12.5 mg Oral BID  . multivitamins ther. w/minerals  1 tablet Oral Daily  . polyethylene glycol  17 g Oral Daily  . predniSONE  3 mg Oral Daily  . sodium chloride  3 mL Intravenous Q12H  . DISCONTD: guaiFENesin  600 mg Oral BID  . DISCONTD: levofloxacin  750 mg Oral QHS   Assessment/Plan  #1. Volume overload/ Acute Diastolic CHF clinicall improving, continue po lasix- cr stabilizing will increase to bid, cxr unchanged from 1/28, follow  #2. COPD - Stable. Continue oxygen, oral antibioticand nebs prn, f/u with  pulm outpt  #3.? PNA  will change abe to rocephin and zithromax to cover e.coli uti which is resistant to quinolones . Urine legionella and pneumococcus antigen negative. Will continue Mucinex and Tessalon pearls #3. #4Hypoxia Likely secondary to above, continue management as above. #5. HTN stable. Continue current regimen. #6. PMR- prednisone #7. Anemia- secondary to AOCD/iron def anemia.  #8. Falls- CT head. PT/OT. sw assisting with placement. #9 DM - levemir, SSI #10 Renal mass- out pt f/u with urology, dicussed with son. #11E. Coli UTI - change abx to rocephin as as above.   LOS: 4 days   Gypsy Kellogg C 02/03/2011, 4:26 PM

## 2011-02-04 LAB — GLUCOSE, CAPILLARY
Glucose-Capillary: 218 mg/dL — ABNORMAL HIGH (ref 70–99)
Glucose-Capillary: 110 mg/dL — ABNORMAL HIGH (ref 70–99)

## 2011-02-04 LAB — BASIC METABOLIC PANEL
BUN: 43 mg/dL — ABNORMAL HIGH (ref 6–23)
Chloride: 84 mEq/L — ABNORMAL LOW (ref 96–112)
Creatinine, Ser: 1.06 mg/dL (ref 0.50–1.10)
GFR calc Af Amer: 55 mL/min — ABNORMAL LOW (ref >90)
GFR calc non Af Amer: 47 mL/min — ABNORMAL LOW (ref >90)
Glucose, Bld: 193 mg/dL — ABNORMAL HIGH (ref 70–99)
Potassium: 3.7 mEq/L (ref 3.5–5.1)

## 2011-02-04 LAB — CBC
HCT: 39.4 % (ref 36.0–46.0)
Hemoglobin: 12.8 g/dL (ref 12.0–15.0)
MCH: 31 pg (ref 26.0–34.0)
MCHC: 32.5 g/dL (ref 30.0–36.0)
RDW: 14.2 % (ref 11.5–15.5)

## 2011-02-04 MED ORDER — GUAIFENESIN-DM 100-10 MG/5ML PO SYRP
5.0000 mL | ORAL_SOLUTION | Freq: Four times a day (QID) | ORAL | Status: DC | PRN
  Administered 2011-02-06: 5 mL via ORAL
  Filled 2011-02-04: qty 10

## 2011-02-04 MED ORDER — FUROSEMIDE 40 MG PO TABS
40.0000 mg | ORAL_TABLET | Freq: Every day | ORAL | Status: DC
  Administered 2011-02-06 – 2011-02-07 (×2): 40 mg via ORAL
  Filled 2011-02-04 (×2): qty 1

## 2011-02-04 NOTE — Progress Notes (Signed)
Subjective: reportsIntermittent cough, denies cp  Objective: Vital signs in last 24 hours: Filed Vitals:   02/04/11 0700 02/04/11 0837 02/04/11 1354 02/04/11 2030  BP:   116/81   Pulse:   98   Temp:   98.1 F (36.7 C)   TempSrc:   Oral   Resp:   16   Height:      Weight: 73.4 kg (161 lb 13.1 oz)     SpO2:  92% 97% 94%    Intake/Output Summary (Last 24 hours) at 02/04/11 2145 Last data filed at 02/04/11 1700  Gross per 24 hour  Intake    303 ml  Output    951 ml  Net   -648 ml    Weight change: -2.532 kg (-5 lb 9.3 oz)  General:  awake, in no acute distress. HEENT: No bruits, no goiter. Heart: Regular rate and rhythm, without murmurs, rubs, gallops. Lungs:decreased BS at bases,no wheezes.  Abdomen: Soft, nontender, nondistended, positive bowel sounds. Extremities: No clubbing cyanosis or edema  Neuro: Grossly intact, nonfocal.    Lab Results:  Basename 02/04/11 0550 02/03/11 0535  NA 133* 134*  K 3.7 4.0  CL 84* 86*  CO2 40* 39*  GLUCOSE 193* 131*  BUN 43* 32*  CREATININE 1.06 1.04  CALCIUM 10.3 10.1  MG -- --  PHOS -- --   No results found for this basename: AST:2,ALT:2,ALKPHOS:2,BILITOT:2,PROT:2,ALBUMIN:2 in the last 72 hours No results found for this basename: LIPASE:2,AMYLASE:2 in the last 72 hours  Basename 02/04/11 0550 02/02/11 0515  WBC 10.0 8.3  NEUTROABS -- --  HGB 12.8 13.0  HCT 39.4 39.4  MCV 95.4 94.0  PLT 240 177   No results found for this basename: CKTOTAL:3,CKMB:3,CKMBINDEX:3,TROPONINI:3 in the last 72 hours  Basename 02/02/11 0515  POCBNP 358.2   No results found for this basename: DDIMER:2 in the last 72 hours No results found for this basename: HGBA1C:2 in the last 72 hours No results found for this basename: CHOL:2,HDL:2,LDLCALC:2,TRIG:2,CHOLHDL:2,LDLDIRECT:2 in the last 72 hours No results found for this basename: TSH,T4TOTAL,FREET3,T3FREE,THYROIDAB in the last 72 hours No results found for this basename:  VITAMINB12:2,FOLATE:2,FERRITIN:2,TIBC:2,IRON:2,RETICCTPCT:2 in the last 72 hours  Micro Results: Recent Results (from the past 240 hour(s))  URINE CULTURE     Status: Normal   Collection Time   01/28/11  5:33 PM      Component Value Range Status Comment   Specimen Description URINE, CLEAN CATCH   Final    Special Requests NONE   Final    Setup Time [id number]   Final    Colony Count >=100,000 COLONIES/ML   Final    Culture ESCHERICHIA COLI   Final    Report Status 01/31/2011 FINAL   Final    Organism ID, Bacteria ESCHERICHIA COLI   Final   CULTURE, BLOOD (ROUTINE X 2)     Status: Normal (Preliminary result)   Collection Time   01/30/11  6:30 PM      Component Value Range Status Comment   Specimen Description BLOOD LEFT ARM  5 ML IN [facility] BOTTLE   Final    Special Requests NONE   Final    Setup Time [id number]   Final    Culture     Final    Value:        BLOOD CULTURE RECEIVED NO GROWTH TO DATE CULTURE WILL BE HELD FOR 5 DAYS BEFORE ISSUING A FINAL NEGATIVE REPORT   Report Status PENDING   Incomplete   CULTURE, BLOOD (ROUTINE  X 2)     Status: Normal   Collection Time   01/30/11  6:47 PM      Component Value Range Status Comment   Specimen Description BLOOD LEFT FOREARM  5 ML IN [facility] BOTTLE   Final    Special Requests NONE   Final    Setup Time [id number]   Final    Culture     Final    Value: ACINETOBACTER LWOFFII     Note: Gram Stain Report Called to,Read Back By and Verified WithOtho Najjar @ 1535 01/31/11 WICKN   Report Status 02/03/2011 FINAL   Final    Organism ID, Bacteria ACINETOBACTER LWOFFII   Final     Studies/Results: Dg Chest Port 1 View  02/03/2011  *RADIOLOGY REPORT*  Clinical Data: Evaluate infiltrates  PORTABLE CHEST - 1 VIEW  Comparison: Aug 02, 2010; 02/01/2011; 01/30/2011  Findings: Grossly unchanged cardiac silhouette and mediastinal contours. Grossly unchanged ill-defined heterogeneous interstitial opacities within the right upper and left mid  lung.  No new focal airspace opacities.  No pleural effusion or pneumothorax. Unchanged bones including lower cervical ACDF, incompletely evaluated.  IMPRESSION:  Grossly unchanged right upper and mid lung heterogeneous interstitial opacities, possibly areas of asymmetric pulmonary edema though atypical infection is not excluded.  Original Report Authenticated By: Waynard Reeds, M.D.    Medications:     . albuterol  2.5 mg Nebulization Q6H  . amitriptyline  50 mg Oral QHS  . aspirin EC  325 mg Oral Daily  . azithromycin  500 mg Oral Daily  . benzonatate  100 mg Oral TID  . cefTRIAXone (ROCEPHIN)  IV  1 g Intravenous Q24H  . darifenacin  15 mg Oral Daily  . dextromethorphan-guaiFENesin  2 tablet Oral BID  . enoxaparin  40 mg Subcutaneous QHS  . furosemide  40 mg Oral BID  . gabapentin  600 mg Oral TID  . insulin aspart  0-5 Units Subcutaneous QHS  . insulin aspart  0-9 Units Subcutaneous TID WC  . insulin aspart  3 Units Subcutaneous TID WC  . insulin detemir  32 Units Subcutaneous QHS  . ipratropium  0.5 mg Nebulization Q6H  . metoprolol tartrate  12.5 mg Oral BID  . multivitamins ther. w/minerals  1 tablet Oral Daily  . polyethylene glycol  17 g Oral Daily  . predniSONE  3 mg Oral Daily  . sodium chloride  3 mL Intravenous Q12H  . DISCONTD: furosemide  40 mg Oral BID  . DISCONTD: levofloxacin  750 mg Oral Q48H   Assessment/Plan  #1. Volume overload/ Acute Diastolic CHF Clinically better, increased BUN, CO2/contraction alkalosis noted. Will hold 12/1 lasix, follow and resume on 12/2  #2. COPD - Stable. Continue oxygen, oral antibioticand nebs prn, f/u with pulm outpt  #3.? PNA On rocephin and zithromax to cover e.coli uti which is resistant to quinolones . Urine legionella and pneumococcus antigen negative. Will continue Mucinex and Tessalon pearls #3. #4Acinetbacter bacteremia- 1/2 bottles, will continue Rocephin, complete 2weeks of abx total. discuss with ID in am .    #5Hypoxia Likely secondary to above, continue management as above. #6. HTN stable. Continue current regimen. #7. PMR- prednisone #8. Anemia- secondary to AOCD/iron def anemia.  #9. Falls- CT head. PT/OT. sw assisting with placement. #10 DM - levemir, SSI #11 Renal mass- out pt f/u with urology, dicussed with son. #12 E. Coli UTI - change abx to rocephin as as above.   LOS: 5 days  Maahi Lannan C 02/04/2011, 9:45 PM

## 2011-02-04 NOTE — Progress Notes (Signed)
CSW continues to follow for d/c planning to SNF.  Pt's son is reviewing bed offers.  Plan for d/c Monday to SNF.  Will contact son today/over weekend to confirm bed choice.

## 2011-02-05 LAB — GLUCOSE, CAPILLARY
Glucose-Capillary: 115 mg/dL — ABNORMAL HIGH (ref 70–99)
Glucose-Capillary: 79 mg/dL (ref 70–99)
Glucose-Capillary: 136 mg/dL — ABNORMAL HIGH (ref 70–99)

## 2011-02-05 MED ORDER — GABAPENTIN 300 MG PO CAPS
300.0000 mg | ORAL_CAPSULE | Freq: Three times a day (TID) | ORAL | Status: DC
  Administered 2011-02-06 – 2011-02-07 (×4): 300 mg via ORAL
  Filled 2011-02-05 (×6): qty 1

## 2011-02-05 MED ORDER — AMITRIPTYLINE HCL 10 MG PO TABS
10.0000 mg | ORAL_TABLET | Freq: Every day | ORAL | Status: DC
  Administered 2011-02-06: 10 mg via ORAL
  Filled 2011-02-05 (×3): qty 1

## 2011-02-05 NOTE — Progress Notes (Signed)
Subjective: States cough much improved, somnolent, but easily aroused.  Objective: Vital signs in last 24 hours: Filed Vitals:   02/05/11 0610 02/05/11 0916 02/05/11 1459 02/05/11 1625  BP: 103/64  97/60   Pulse: 80  79   Temp: 97.4 F (36.3 Owens)  98.3 F (36.8 Owens)   TempSrc: Oral  Oral   Resp: 16  16   Height:      Weight:      SpO2: 94% 99% 99% 96%    Intake/Output Summary (Last 24 hours) at 02/05/11 1751 Last data filed at 02/05/11 1500  Gross per 24 hour  Intake    240 ml  Output   1301 ml  Net  -1061 ml    Weight change:   General:  sleepy, in no acute distress. HEENT: No bruits, no goiter. Heart: Regular rate and rhythm, without murmurs, rubs, gallops. Lungs:decreased BS at bases,no wheezes.  Abdomen: Soft, nontender, nondistended, positive bowel sounds. Extremities: No clubbing cyanosis or edema  Neuro: Grossly intact, nonfocal.    Lab Results:  Basename 02/04/11 0550 02/03/11 0535  NA 133* 134*  K 3.7 4.0  CL 84* 86*  CO2 40* 39*  GLUCOSE 193* 131*  BUN 43* 32*  CREATININE 1.06 1.04  CALCIUM 10.3 10.1  MG -- --  PHOS -- --   No results found for this basename: AST:2,ALT:2,ALKPHOS:2,BILITOT:2,PROT:2,ALBUMIN:2 in the last 72 hours No results found for this basename: LIPASE:2,AMYLASE:2 in the last 72 hours  Basename 02/04/11 0550  WBC 10.0  NEUTROABS --  HGB 12.8  HCT 39.4  MCV 95.4  PLT 240   No results found for this basename: CKTOTAL:3,CKMB:3,CKMBINDEX:3,TROPONINI:3 in the last 72 hours No results found for this basename: POCBNP:3 in the last 72 hours No results found for this basename: DDIMER:2 in the last 72 hours No results found for this basename: HGBA1C:2 in the last 72 hours No results found for this basename: CHOL:2,HDL:2,LDLCALC:2,TRIG:2,CHOLHDL:2,LDLDIRECT:2 in the last 72 hours No results found for this basename: TSH,T4TOTAL,FREET3,T3FREE,THYROIDAB in the last 72 hours No results found for this basename:  VITAMINB12:2,FOLATE:2,FERRITIN:2,TIBC:2,IRON:2,RETICCTPCT:2 in the last 72 hours  Micro Results: Recent Results (from the past 240 hour(s))  URINE CULTURE     Status: Normal   Collection Time   01/28/11  5:33 PM      Component Value Range Status Comment   Specimen Description URINE, CLEAN CATCH   Final    Special Requests NONE   Final    Setup Time [id number]   Final    Colony Count >=100,000 COLONIES/ML   Final    Culture ESCHERICHIA COLI   Final    Report Status 01/31/2011 FINAL   Final    Organism ID, Bacteria ESCHERICHIA COLI   Final   CULTURE, BLOOD (ROUTINE X 2)     Status: Normal (Preliminary result)   Collection Time   01/30/11  6:30 PM      Component Value Range Status Comment   Specimen Description BLOOD LEFT ARM  5 ML IN [facility] BOTTLE   Final    Special Requests NONE   Final    Setup Time [id number]   Final    Culture     Final    Value:        BLOOD CULTURE RECEIVED NO GROWTH TO DATE CULTURE WILL BE HELD FOR 5 DAYS BEFORE ISSUING A FINAL NEGATIVE REPORT   Report Status PENDING   Incomplete   CULTURE, BLOOD (ROUTINE X 2)     Status: Normal  Collection Time   01/30/11  6:47 PM      Component Value Range Status Comment   Specimen Description BLOOD LEFT FOREARM  5 ML IN [facility] BOTTLE   Final    Special Requests NONE   Final    Setup Time [id number]   Final    Culture     Final    Value: ACINETOBACTER LWOFFII     Note: Gram Stain Report Called to,Read Back By and Verified WithOtho Najjar @ 1535 01/31/11 WICKN   Report Status 02/03/2011 FINAL   Final    Organism ID, Bacteria ACINETOBACTER LWOFFII   Final     Studies/Results: No results found.  Medications:     . albuterol  2.5 mg Nebulization Q6H  . amitriptyline  50 mg Oral QHS  . aspirin EC  325 mg Oral Daily  . azithromycin  500 mg Oral Daily  . benzonatate  100 mg Oral TID  . cefTRIAXone (ROCEPHIN)  IV  1 g Intravenous Q24H  . darifenacin  15 mg Oral Daily  . dextromethorphan-guaiFENesin  2 tablet  Oral BID  . enoxaparin  40 mg Subcutaneous QHS  . furosemide  40 mg Oral Daily  . gabapentin  600 mg Oral TID  . insulin aspart  0-5 Units Subcutaneous QHS  . insulin aspart  0-9 Units Subcutaneous TID WC  . insulin aspart  3 Units Subcutaneous TID WC  . insulin detemir  32 Units Subcutaneous QHS  . ipratropium  0.5 mg Nebulization Q6H  . metoprolol tartrate  12.5 mg Oral BID  . multivitamins ther. w/minerals  1 tablet Oral Daily  . polyethylene glycol  17 g Oral Daily  . predniSONE  3 mg Oral Daily  . sodium chloride  3 mL Intravenous Q12H  . DISCONTD: furosemide  40 mg Oral BID   Assessment/Plan  #1. Volume overload/ Acute Diastolic CHF- clinically improved. Lasix, Follow.  #2. COPD - Stable. Continue oxygen, oral antibioticand nebs prn, f/u with pulm outpt  #3.? PNA -  On rocephin and zithromax to cover e.coli uti which is resistant to quinolones . Urine legionella and pneumococcus antigen negative. Will continue Mucinex and Tessalon pearls #3. #4Acinetbacter bacteremia- 1/2 bottles, will continue Rocephin, complete 2weeks of abx total.   #5Hypoxia - Likely secondary to above, continue management as above. #6. HTN stable. Continue current regimen. #7. PMR- prednisone #8. Anemia- secondary to AOCD/iron def anemia.  #9. Falls- CT head. PT/OT. sw assisting with placement. #10 DM - levemir, SSI #11 Renal mass- out pt f/u with urology, dicussed with son. #12 E. Coli UTI - continue rocephin as as above.   LOS: 6 days   [name] 02/05/2011, 5:51 PM

## 2011-02-06 ENCOUNTER — Other Ambulatory Visit (HOSPITAL_COMMUNITY): Payer: Medicare Other

## 2011-02-06 LAB — BASIC METABOLIC PANEL
BUN: 43 mg/dL — ABNORMAL HIGH (ref 6–23)
Calcium: 9.9 mg/dL (ref 8.4–10.5)
Creatinine, Ser: 1.06 mg/dL (ref 0.50–1.10)
GFR calc Af Amer: 55 mL/min — ABNORMAL LOW (ref >90)
GFR calc non Af Amer: 47 mL/min — ABNORMAL LOW (ref >90)
Glucose, Bld: 102 mg/dL — ABNORMAL HIGH (ref 70–99)

## 2011-02-06 LAB — CBC
Hemoglobin: 11.8 g/dL — ABNORMAL LOW (ref 12.0–15.0)
MCH: 31.6 pg (ref 26.0–34.0)
MCHC: 33.3 g/dL (ref 30.0–36.0)
Platelets: 286 10*3/uL (ref 150–400)
RDW: 14.5 % (ref 11.5–15.5)

## 2011-02-06 LAB — GLUCOSE, CAPILLARY
Glucose-Capillary: 206 mg/dL — ABNORMAL HIGH (ref 70–99)
Glucose-Capillary: 113 mg/dL — ABNORMAL HIGH (ref 70–99)

## 2011-02-06 LAB — CULTURE, BLOOD (ROUTINE X 2)
Culture  Setup Time: 201211260059
Culture: NO GROWTH

## 2011-02-06 MED ORDER — METHYLPREDNISOLONE SODIUM SUCC 40 MG IJ SOLR
40.0000 mg | Freq: Once | INTRAMUSCULAR | Status: AC
  Administered 2011-02-06: 40 mg via INTRAVENOUS
  Filled 2011-02-06: qty 1

## 2011-02-06 MED ORDER — PREDNISONE 20 MG PO TABS
20.0000 mg | ORAL_TABLET | Freq: Every day | ORAL | Status: DC
  Administered 2011-02-07: 20 mg via ORAL
  Filled 2011-02-06: qty 1

## 2011-02-06 MED ORDER — POTASSIUM CHLORIDE CRYS ER 20 MEQ PO TBCR
40.0000 meq | EXTENDED_RELEASE_TABLET | Freq: Once | ORAL | Status: AC
  Administered 2011-02-06: 40 meq via ORAL
  Filled 2011-02-06: qty 2

## 2011-02-06 NOTE — Progress Notes (Signed)
Subjective: Awake, still with cough and states that  it hurts when she coughs.  Son requesting MRI to evaluate renal mass prior to discharge. Objective: Vital signs in last 24 hours: Filed Vitals:   02/06/11 0142 02/06/11 0600 02/06/11 0857 02/06/11 1410  BP:  108/63  155/83  Pulse:  84  81  Temp:  97.8 F (36.6 C)  98.9 F (37.2 C)  TempSrc:  Oral  Oral  Resp:  18  18  Height:      Weight:  73.12 kg (161 lb 3.2 oz)    SpO2: 95% 95% 95% 96%    Intake/Output Summary (Last 24 hours) at 02/06/11 1447 Last data filed at 02/06/11 1300  Gross per 24 hour  Intake    360 ml  Output   1552 ml  Net  -1192 ml    Weight change:   General:  awake, in no acute distress. HEENT: No bruits, no goiter. Heart: Regular rate and rhythm, without murmurs, rubs, gallops. Lungs:decreased BS at bases, scattered wheezes bil.  Abdomen: Soft, nontender, nondistended, positive bowel sounds. Extremities: No clubbing cyanosis or edema  Neuro: Grossly intact, nonfocal.    Lab Results:  Basename 02/06/11 0550 02/04/11 0550  NA 131* 133*  K 3.4* 3.7  CL 85* 84*  CO2 39* 40*  GLUCOSE 102* 193*  BUN 43* 43*  CREATININE 1.06 1.06  CALCIUM 9.9 10.3  MG -- --  PHOS -- --   No results found for this basename: AST:2,ALT:2,ALKPHOS:2,BILITOT:2,PROT:2,ALBUMIN:2 in the last 72 hours No results found for this basename: LIPASE:2,AMYLASE:2 in the last 72 hours  Basename 02/06/11 0550 02/04/11 0550  WBC 8.8 10.0  NEUTROABS -- --  HGB 11.8* 12.8  HCT 35.4* 39.4  MCV 94.9 95.4  PLT 286 240   No results found for this basename: CKTOTAL:3,CKMB:3,CKMBINDEX:3,TROPONINI:3 in the last 72 hours No results found for this basename: POCBNP:3 in the last 72 hours No results found for this basename: DDIMER:2 in the last 72 hours No results found for this basename: HGBA1C:2 in the last 72 hours No results found for this basename: CHOL:2,HDL:2,LDLCALC:2,TRIG:2,CHOLHDL:2,LDLDIRECT:2 in the last 72 hours No  results found for this basename: TSH,T4TOTAL,FREET3,T3FREE,THYROIDAB in the last 72 hours No results found for this basename: VITAMINB12:2,FOLATE:2,FERRITIN:2,TIBC:2,IRON:2,RETICCTPCT:2 in the last 72 hours  Micro Results: Recent Results (from the past 240 hour(s))  URINE CULTURE     Status: Normal   Collection Time   01/28/11  5:33 PM      Component Value Range Status Comment   Specimen Description URINE, CLEAN CATCH   Final    Special Requests NONE   Final    Setup Time [id number]   Final    Colony Count >=100,000 COLONIES/ML   Final    Culture ESCHERICHIA COLI   Final    Report Status 01/31/2011 FINAL   Final    Organism ID, Bacteria ESCHERICHIA COLI   Final   CULTURE, BLOOD (ROUTINE X 2)     Status: Normal   Collection Time   01/30/11  6:30 PM      Component Value Range Status Comment   Specimen Description BLOOD LEFT ARM  5 ML IN [facility] BOTTLE   Final    Special Requests NONE   Final    Setup Time [id number]   Final    Culture NO GROWTH 5 DAYS   Final    Report Status 02/06/2011 FINAL   Final   CULTURE, BLOOD (ROUTINE X 2)     Status: Normal  Collection Time   01/30/11  6:47 PM      Component Value Range Status Comment   Specimen Description BLOOD LEFT FOREARM  5 ML IN [facility] BOTTLE   Final    Special Requests NONE   Final    Setup Time [id number]   Final    Culture     Final    Value: ACINETOBACTER LWOFFII     Note: Gram Stain Report Called to,Read Back By and Verified WithOtho Najjar @ 1535 01/31/11 WICKN   Report Status 02/03/2011 FINAL   Final    Organism ID, Bacteria ACINETOBACTER LWOFFII   Final     Studies/Results: No results found.  Medications:     . albuterol  2.5 mg Nebulization Q6H  . amitriptyline  10 mg Oral QHS  . aspirin EC  325 mg Oral Daily  . azithromycin  500 mg Oral Daily  . benzonatate  100 mg Oral TID  . cefTRIAXone (ROCEPHIN)  IV  1 g Intravenous Q24H  . darifenacin  15 mg Oral Daily  . dextromethorphan-guaiFENesin  2 tablet  Oral BID  . enoxaparin  40 mg Subcutaneous QHS  . furosemide  40 mg Oral Daily  . gabapentin  300 mg Oral TID  . insulin aspart  0-5 Units Subcutaneous QHS  . insulin aspart  0-9 Units Subcutaneous TID WC  . insulin aspart  3 Units Subcutaneous TID WC  . insulin detemir  32 Units Subcutaneous QHS  . ipratropium  0.5 mg Nebulization Q6H  . methylPREDNISolone (SOLU-MEDROL) injection  40 mg Intravenous Once  . metoprolol tartrate  12.5 mg Oral BID  . multivitamins ther. w/minerals  1 tablet Oral Daily  . polyethylene glycol  17 g Oral Daily  . potassium chloride  40 mEq Oral Once  . predniSONE  20 mg Oral QAC breakfast  . sodium chloride  3 mL Intravenous Q12H  . DISCONTD: amitriptyline  50 mg Oral QHS  . DISCONTD: gabapentin  600 mg Oral TID  . DISCONTD: predniSONE  3 mg Oral Daily   Assessment/Plan  #1. Volume overload/ Acute Diastolic CHF- clinically improved. Lasix, Follow.  #2. COPD - continue nebulized bronchodilators, will add steroids, continue antibiotics  #3.? PNA -  On rocephin and zithromax to cover e.coli uti which is resistant to quinolones . Urine legionella and pneumococcus antigen negative. Will continue Mucinex and Tessalon pearls #3. #4Acinetbacter bacteremia- 1/2 bottles, will continue Rocephin, complete 2weeks of abx total.   #5Hypoxia - Likely secondary to above, continue management as above. #6. HTN stable. Continue current regimen. #7. PMR- steroids as above #8. Anemia- secondary to AOCD/iron def anemia.  #9. Falls- CT head. PT/OT. sw assisting with placement. #10 DM - levemir, SSI #11 Renal mass- will obtain MRI to further evaluate #12 E. Coli UTI - continue rocephin as as above.   LOS: 7 days   Adedamola Seto C 02/06/2011, 2:47 PM

## 2011-02-07 LAB — BASIC METABOLIC PANEL
Chloride: 90 mEq/L — ABNORMAL LOW (ref 96–112)
GFR calc non Af Amer: 75 mL/min — ABNORMAL LOW (ref >90)
Glucose, Bld: 253 mg/dL — ABNORMAL HIGH (ref 70–99)
Potassium: 4.5 mEq/L (ref 3.5–5.1)
Sodium: 131 mEq/L — ABNORMAL LOW (ref 135–145)

## 2011-02-07 LAB — GLUCOSE, CAPILLARY
Glucose-Capillary: 235 mg/dL — ABNORMAL HIGH (ref 70–99)
Glucose-Capillary: 262 mg/dL — ABNORMAL HIGH (ref 70–99)

## 2011-02-07 MED ORDER — BENZONATATE 100 MG PO CAPS: 100.0000 mg | ORAL_CAPSULE | Freq: Three times a day (TID) | ORAL | Status: AC

## 2011-02-07 MED ORDER — FUROSEMIDE 40 MG PO TABS: 40.0000 mg | ORAL_TABLET | Freq: Every day | ORAL | Status: DC

## 2011-02-07 MED ORDER — INSULIN ASPART 100 UNIT/ML ~~LOC~~ SOLN: 0.0000 [IU] | Freq: Every day | SUBCUTANEOUS | Status: DC

## 2011-02-07 MED ORDER — GABAPENTIN 300 MG PO CAPS: 300.0000 mg | ORAL_CAPSULE | Freq: Three times a day (TID) | ORAL | Status: DC

## 2011-02-07 MED ORDER — POTASSIUM CHLORIDE ER 10 MEQ PO TBCR: 20.0000 meq | EXTENDED_RELEASE_TABLET | Freq: Every day | ORAL | Status: DC

## 2011-02-07 MED ORDER — DM-GUAIFENESIN ER 30-600 MG PO TB12: 2.0000 | ORAL_TABLET | Freq: Two times a day (BID) | ORAL | Status: AC

## 2011-02-07 MED ORDER — ACETAMINOPHEN 325 MG PO TABS: 650.0000 mg | ORAL_TABLET | Freq: Four times a day (QID) | ORAL | Status: AC | PRN

## 2011-02-07 MED ORDER — PREDNISONE (PAK) 10 MG PO TABS: ORAL_TABLET | ORAL | Status: DC

## 2011-02-07 MED ORDER — CEFUROXIME AXETIL 500 MG PO TABS: 500.0000 mg | ORAL_TABLET | Freq: Two times a day (BID) | ORAL | Status: AC

## 2011-02-07 MED ORDER — GUAIFENESIN-DM 100-10 MG/5ML PO SYRP: 5.0000 mL | ORAL_SOLUTION | Freq: Four times a day (QID) | ORAL | Status: AC | PRN

## 2011-02-07 MED ORDER — METOPROLOL TARTRATE 12.5 MG HALF TABLET: 12.5000 mg | ORAL_TABLET | Freq: Two times a day (BID) | ORAL | Status: DC

## 2011-02-07 MED ORDER — AMITRIPTYLINE HCL 50 MG PO TABS: 25.0000 mg | ORAL_TABLET | Freq: Every day | ORAL | Status: DC

## 2011-02-07 MED ORDER — PREDNISONE 1 MG PO TABS: 3.0000 mg | ORAL_TABLET | Freq: Every day | ORAL | Status: DC

## 2011-02-07 MED ORDER — POLYETHYLENE GLYCOL 3350 17 G PO PACK: 17.0000 g | PACK | Freq: Every day | ORAL | Status: AC | PRN

## 2011-02-07 MED ORDER — IPRATROPIUM BROMIDE 0.02 % IN SOLN: 0.5000 mg | Freq: Four times a day (QID) | RESPIRATORY_TRACT | Status: DC

## 2011-02-07 MED ORDER — ALBUTEROL SULFATE (5 MG/ML) 0.5% IN NEBU: 2.5000 mg | INHALATION_SOLUTION | Freq: Four times a day (QID) | RESPIRATORY_TRACT | Status: DC

## 2011-02-07 MED ORDER — INSULIN ASPART 100 UNIT/ML ~~LOC~~ SOLN: 0.0000 [IU] | Freq: Three times a day (TID) | SUBCUTANEOUS | Status: DC

## 2011-02-07 NOTE — Discharge Summary (Signed)
Name: [name] MRN: [id number] DOB: 06-01-1924 77 y.o.  Date of Admission: 01/30/2011  2:51 PM Date of Discharge: 02/07/2011 Attending Physician: Kela Millin  Discharge Diagnosis: Principal Problem:  *Acute diastolic CHF Active Problems: Probable Pneumonia Chronic obstructive pulmonary disease- patient to followup with [facility]ology outpatient , nursing home to call  [phone] for appointment Acinetobacter bacteremia  Diabetes mellitusobar  HTN (hypertension)  Polymyalgia rheumatica  Renal mass- patient is to followup with Dr. Annabell Howells urologist, upon discharge-nursing home to call for appointment at [phone] E. coli urinary tract infection  Anemia  Discharge Medications: Current Discharge Medication List    START taking these medications   Details  acetaminophen (TYLENOL) 325 MG tablet Take 2 tablets (650 mg total) by mouth every 6 (six) hours as needed (or Fever >/= 101). Qty: 30 tablet    albuterol (PROVENTIL) (5 MG/ML) 0.5% nebulizer solution Take 0.5 mLs (2.5 mg total) by nebulization every 6 (six) hours. Qty: 200 mL, Refills: 0    benzonatate (TESSALON) 100 MG capsule Take 1 capsule (100 mg total) by mouth 3 (three) times daily. Qty: 90 capsule, Refills: 0    cefUROXime (CEFTIN) 500 MG tablet Take 1 tablet (500 mg total) by mouth 2 (two) times daily. Qty: 20 tablet, Refills: 0    dextromethorphan-guaiFENesin (MUCINEX DM) 30-600 MG per 12 hr tablet Take 2 tablets by mouth 2 (two) times daily. Qty: 60 tablet, Refills: 0    furosemide (LASIX) 40 MG tablet Take 1 tablet (40 mg total) by mouth daily. Qty: 30 tablet, Refills: 0    guaiFENesin-dextromethorphan (ROBITUSSIN DM) 100-10 MG/5ML syrup Take 5 mLs by mouth every 6 (six) hours as needed for cough (1st now). Qty: 118 mL, Refills: 0    ipratropium (ATROVENT) 0.02 % nebulizer solution Take 2.5 mLs (0.5 mg total) by nebulization every 6 (six) hours. Qty: 75 mL, Refills: 0    metoprolol tartrate  (LOPRESSOR) 12.5 mg TABS Take 0.5 tablets (12.5 mg total) by mouth 2 (two) times daily. Qty: 60 tablet, Refills: 0    polyethylene glycol (MIRALAX / GLYCOLAX) packet Take 17 g by mouth daily as needed. Qty: 14 each, Refills: 0    potassium chloride (K-DUR) 10 MEQ tablet Take 2 tablets (20 mEq total) by mouth daily. Qty: 30 tablet, Refills: 0    predniSONE (STERAPRED UNI-PAK) 10 MG tablet Take 2 tablets daily for 5 days, then1 tablet daily for 5 days, then half a tablet daily for 5 days, then resume her maintenance dose of 3 mg daily long-term as previously for PMR Qty: 18 tablet, Refills: 0      CONTINUE these medications which have CHANGED   Details  amitriptyline (ELAVIL) 50 MG tablet Take 0.5 tablets (25 mg total) by mouth at bedtime. Qty: 30 tablet, Refills: 0    gabapentin (NEURONTIN) 300 MG capsule Take 1 capsule (300 mg total) by mouth 3 (three) times daily. Qty: 30 capsule, Refills: 0    !! insulin aspart (NOVOLOG) 100 UNIT/ML injection Inject 0-5 Units into the skin at bedtime. Qty: 1 vial, Refills: 0    !! insulin aspart (NOVOLOG) 100 UNIT/ML injection Inject 0-9 Units into the skin 3 (three) times daily with meals. Qty: 1 vial, Refills: 0    predniSONE (DELTASONE) 1 MG tablet Take 3 tablets (3 mg total) by mouth daily. Qty: 3 tablet, Refills: 0     !! - Potential duplicate medications found. Please discuss with provider.    CONTINUE these medications which have NOT CHANGED  Details  acyclovir (ZOVIRAX) 200 MG capsule Take 200 mg by mouth daily.     aspirin EC 325 MG tablet Take 325 mg by mouth daily.      insulin detemir (LEVEMIR) 100 UNIT/ML injection Inject 32 Units into the skin at bedtime.     Multiple Vitamins-Minerals (MULTIVITAMINS THER. W/MINERALS) TABS Take 1 tablet by mouth daily.      solifenacin (VESICARE) 10 MG tablet Take 10 mg by mouth daily.       STOP taking these medications     cephALEXin (KEFLEX) 250 MG capsule      nitrofurantoin,  macrocrystal-monohydrate, (MACROBID) 100 MG capsule         Disposition and follow-up:   Ms.Jacinda KEILLY FATULA was discharged from [facility] in improved/stable condition.    Follow-up Appointments: Discharge Orders    Future Appointments: Provider: Department: Dept Phone: Center:   02/09/2011 2:00 PM Levert Feinstein, MD [facility] [phone] None     Future Orders Please Complete By Expires   Diet - low sodium heart healthy      Increase activity slowly         Consultations:    Procedures Performed:  Dg Chest 2 View  01/30/2011  *RADIOLOGY REPORT*  Clinical Data: Fall with cough and chest pain.  CHEST - 2 VIEW  Comparison: 01/28/2011 and CT chest 01/10/2011.  Findings: Trachea is midline.  Heart size stable.  Lungs are low in volume with diffuse bilateral air space disease.  No pleural fluid.  IMPRESSION: Low lung volumes with diffuse bilateral air space disease.  Edema or pneumonia could have this appearance.  Original Report Authenticated By: Reyes Ivan, M.D.   Dg Chest 2 View  01/28/2011  *RADIOLOGY REPORT*  Clinical Data: Fall with back pain.  CHEST - 2 VIEW  Comparison: CT chest 01/10/2011 and chest radiograph 01/10/2011.  Findings: Trachea is midline.  Heart size stable. Thoracic aorta is tortuous.  There is mild interstitial prominence and indistinctness bilaterally, with areas of somewhat linear air space opacification in the right mid lung zone and left lung base.  No pleural fluid. Upper lumbar compression fracture is again noted.  IMPRESSION:  1. Pulmonary parenchymal interstitial prominence and indistinctness may be due to edema.  A viral process cannot be excluded. 2.  Probable scattered atelectasis bilaterally.  Original Report Authenticated By: Reyes Ivan, M.D.    Dg Lumbar Spine Complete  01/28/2011  *RADIOLOGY REPORT*  Clinical Data: 77 year old female with low back pain following fall.  LUMBAR SPINE - COMPLETE 4+ VIEW   Comparison: 01/10/2011 chest radiograph, 04/21/2009 abdominal radiograph and 01/08/2005 lumbar spine MRI.  Findings: Five non-rib bearing lumbar type vertebra are again identified with a moderate apex right lumbar scoliosis. A 75% T12 compression fracture is unchanged. There is no evidence of acute fracture or subluxation. Moderate to severe degenerative disc disease and moderate facet arthropathy throughout the lumbar spine again noted.  IMPRESSION: No evidence of acute bony abnormality.  Remote T12 compression fracture.  Scoliosis and moderate to severe multilevel degenerative changes again noted.  Original Report Authenticated By: Rosendo Gros, M.D.   Dg Pelvis 1-2 Views  01/10/2011  *RADIOLOGY REPORT*  Clinical Data: Fall  PELVIS - 1-2 VIEW  Comparison: None.  Findings: Left total hip arthroplasty is in place.  No breakage or loosening of the hardware.  No acute fracture.  Osteopenia.  IMPRESSION: No acute bony pathology.  Left total hip arthroplasty.  Original Report Authenticated By: Merton Border  Joseph Art, M.D.   Ct Head Wo Contrast  02/01/2011  *RADIOLOGY REPORT*  Clinical Data: Altered mental status; assess for infarct.  CT HEAD WITHOUT CONTRAST  Technique:  Contiguous axial images were obtained from the base of the skull through the vertex without contrast.  Comparison: CT of the head performed 01/28/2011  Findings: There is no evidence of acute infarction, mass lesion, or intra- or extra-axial hemorrhage on CT.  Evaluation is mildly suboptimal due to motion artifact.  Periventricular white matter change likely reflects small vessel ischemic microangiopathy.  Mild cerebellar atrophy is noted.  The brainstem and fourth ventricle are within normal limits.  The third and lateral ventricles, and basal ganglia are unremarkable in appearance.  The cerebral hemispheres demonstrate grossly normal gray-white differentiation.  No mass effect or midline shift is seen.  There is no evidence of fracture; visualized  osseous structures are unremarkable in appearance.  The orbits are within normal limits. There is complete opacification of the left maxillary sinus, and chronic mucoperiosteal thickening at the left mastoid process; the remaining visualized paranasal sinuses and right mastoid air cells are well-aerated.  No significant soft tissue abnormalities are seen.  IMPRESSION:  1.  No acute intracranial pathology seen on CT.  If there is significant clinical concern for acute infarct, MRI could be considered for further evaluation. 2.  Mild small vessel ischemic microangiopathy. 3.  Complete opacification of the left maxillary sinus, and chronic mucoperiosteal thickening at the left mastoid process.  Original Report Authenticated By: Tonia Ghent, M.D.   Ct Head Wo Contrast  01/28/2011  *RADIOLOGY REPORT*  Clinical Data:  Fall.  Head pain.  History of neck surgery.  Neck pain.  CT HEAD WITHOUT CONTRAST CT CERVICAL SPINE WITHOUT CONTRAST  Technique:  Multidetector CT imaging of the head and cervical spine was performed following the standard protocol without intravenous contrast.  Multiplanar CT image reconstructions of the cervical spine were also generated.  Comparison:  01/10/2011.  CT HEAD  Findings: No mass lesion, mass effect, midline shift, hydrocephalus, hemorrhage.  No acute territorial cortical ischemia/infarct. Atrophy and chronic ischemic white matter disease is present.  Calvarium intact.  Sclerosis of the inferior left mastoid air cells compatible with remote mastoiditis.  Left maxillary mucous retention cyst or polyp.  No skull fracture.  IMPRESSION: Atrophy and chronic ischemic white matter disease without acute intracranial abnormality.  No interval change from recent prior.  CT CERVICAL SPINE  Findings: Straightening of the normal cervical lordosis.  No cervical spine fracture or dislocation.  Anterior cervical fusion from C5-C7.  Fusion appears complete at these levels.  Posterior cerclage wires are  present around the C5-C7 spinous processes. Calcified disc protrusion is present at C4-C5.  2 mm anterolisthesis of C3 on C4.  Accessory ossicle is present adjacent to the anterior arch of C1.  Multilevel facet arthrosis with multilevel foraminal stenosis.  Ground-glass attenuation is present in the lung apices which may represent pulmonary edema, airspace disease or pneumonia or aspiration.  There is an aberrant right subclavian artery running posterior to the esophagus in the upper chest.  IMPRESSION: Negative for cervical spine fracture or dislocation.  Multilevel degenerative disease.  Uncomplicated fusion at C5-C7.  Aberrant right subclavian artery.  Original Report Authenticated By: Andreas Newport, M.D.    Ct Cervical Spine Wo Contrast  01/28/2011  *RADIOLOGY REPORT*  Clinical Data:  Fall.  Head pain.  History of neck surgery.  Neck pain.  CT HEAD WITHOUT CONTRAST CT CERVICAL SPINE WITHOUT CONTRAST  Technique:  Multidetector CT imaging of the head and cervical spine was performed following the standard protocol without intravenous contrast.  Multiplanar CT image reconstructions of the cervical spine were also generated.  Comparison:  01/10/2011.  CT HEAD  Findings: No mass lesion, mass effect, midline shift, hydrocephalus, hemorrhage.  No acute territorial cortical ischemia/infarct. Atrophy and chronic ischemic white matter disease is present.  Calvarium intact.  Sclerosis of the inferior left mastoid air cells compatible with remote mastoiditis.  Left maxillary mucous retention cyst or polyp.  No skull fracture.  IMPRESSION: Atrophy and chronic ischemic white matter disease without acute intracranial abnormality.  No interval change from recent prior.  CT CERVICAL SPINE  Findings: Straightening of the normal cervical lordosis.  No cervical spine fracture or dislocation.  Anterior cervical fusion from C5-C7.  Fusion appears complete at these levels.  Posterior cerclage wires are present around the C5-C7  spinous processes. Calcified disc protrusion is present at C4-C5.  2 mm anterolisthesis of C3 on C4.  Accessory ossicle is present adjacent to the anterior arch of C1.  Multilevel facet arthrosis with multilevel foraminal stenosis.  Ground-glass attenuation is present in the lung apices which may represent pulmonary edema, airspace disease or pneumonia or aspiration.  There is an aberrant right subclavian artery running posterior to the esophagus in the upper chest.  IMPRESSION: Negative for cervical spine fracture or dislocation.  Multilevel degenerative disease.  Uncomplicated fusion at C5-C7.  Aberrant right subclavian artery.  Original Report Authenticated By: Andreas Newport, M.D.   Mr Abdomen W Wo Contrast  01/12/2011  *RADIOLOGY REPORT*  Clinical Data: Right renal mass  MRI ABDOMEN WITH AND WITHOUT CONTRAST  Technique:  Multiplanar multisequence MR imaging of the abdomen was performed both before and after administration of intravenous contrast.  Contrast: 16mL MULTIHANCE GADOBENATE DIMEGLUMINE 529 MG/ML IV SOLN  Comparison: CT 12/26/2010  Findings: There is a round lesion within the upper right renal cortex which is bulges the contour of the kidney.  This lesion measures 17 x 18 mm and corresponds to the abnormality on comparison CT (image 13, series 4).  The postcontrast T1-weighted images are severely degraded by patient respiratory motion. However, lesion does demonstrate early arterial enhancement similar to the normal renal parenchyma and washout of contrast greater than the renal parenchyma (image 19, series 1303) consistent with a solid renal lesion.  There is a simple cyst in the upper pole of the left kidney which is not enhancing.  No evidence of retroperitoneal adenopathy.  The live, pancreas, spleen, adrenal glands are normal. Gallstones in the gallbladder. No osseous abnormality.  IMPRESSION:  1.  Enhancing solid renal lesion on the right is most consistent with renal cell carcinoma. 2.   Simple cyst within the left kidney (Bosniak I). 3.  Cholelithiasis.  Original Report Authenticated By: Genevive Bi, M.D.   Dg Chest Port 1 View  02/03/2011  *RADIOLOGY REPORT*  Clinical Data: Evaluate infiltrates  PORTABLE CHEST - 1 VIEW  Comparison: Aug 02, 2010; 02/01/2011; 01/30/2011  Findings: Grossly unchanged cardiac silhouette and mediastinal contours. Grossly unchanged ill-defined heterogeneous interstitial opacities within the right upper and left mid lung.  No new focal airspace opacities.  No pleural effusion or pneumothorax. Unchanged bones including lower cervical ACDF, incompletely evaluated.  IMPRESSION:  Grossly unchanged right upper and mid lung heterogeneous interstitial opacities, possibly areas of asymmetric pulmonary edema though atypical infection is not excluded.  Original Report Authenticated By: Waynard Reeds, M.D.   Dg Chest [facility]  02/02/2011  *RADIOLOGY REPORT*  Clinical Data: 77 year-old female with shortness of breath and pulmonary edema.  PORTABLE CHEST - 1 VIEW  Comparison: 02/01/2011  Findings: Upper limits normal heart size again identified. Decreased bilateral airspace/interstitial opacities likely represent improved pulmonary edema. There may be tiny bilateral pleural effusions present. There is no evidence of pneumothorax. Minimal patchy opacities in the left lower lung are stable. No other interval change identified.  IMPRESSION: Decreased pulmonary edema.  No other significant change.  Original Report Authenticated By: Rosendo Gros, M.D.   Dg Chest Port 1 View  02/01/2011  *RADIOLOGY REPORT*  Clinical Data: Pneumonia versus edema.  PORTABLE CHEST - 1 VIEW  Comparison: 01/30/2011  Findings: The patchy bilateral airspace disease again noted, possibly slightly increased since prior study.  This is likely superimposed on underlying chronic lung disease.  Heart is borderline in size.  No effusions.  IMPRESSION: Slight interval worsening patchy bilateral  airspace disease.  Original Report Authenticated By: Cyndie Chime, M.D.     Study Conclusions  Left ventricle: The cavity size was normal. Wall thickness was increased in a pattern of mild LVH. Systolic function was vigorous. The estimated ejection fraction was in the range of 65% to 70%. Doppler parameters are consistent with abnormal left ventricular relaxation (grade 1 diastolic dysfunction). Transthoracic echocardiography. M-mode, complete 2D, spectral Doppler, and color Doppler. Height: Height: 157.5cm. Height: 62in. Weight: Weight: 79.1kg. Weight: 174lb. Body mass index: BMI: 31.9kg/m^2. Body surface area: BSA: 1.89m^2. Blood pressure: 112/65. Patient status: Inpatient. Location: Bedside.  Brief history [name] who was admitted to the hospital recently and diagnosed with a pneumonia and COPD exacerbation. He was discharged on 11/8 on Avelox and prednisone. The patient returns today due to increasing weakness, falls and worsening cough. She does not admit to having any fevers but she did have some chills yesterday. Her son states that she is coughing up brownish colored mucus. She states she has not yet finished her antibiotic but has finished the prednisone that she was prescribed.  The patient also states that she's been losing her balance and falling more frequently than before. His son states that physical therapy was coming to her house however she told him she felt better and let him go. Since then she has been falling more frequently. She states this is secondary to feeling off balance but she does not complain of any dizziness or vertigo. She has not noticed any new focal weakness or numbness.  A chest x-ray performed in the ER is consistent with multifocal infiltrates and she is going to be admitted for treatment of pneumonia.  Physical exam  General: Alert and appropriate, in no acute distress.  HEENT: No bruits, no goiter.  Heart: Regular rate and  rhythm, without murmurs, rubs, gallops.  Lungs: Moderate air movement, no wheezes .  Abdomen: Soft, nontender, nondistended, positive bowel sounds.  Extremities: No clubbing cyanosis or edema  Neuro: Grossly intact, nonfocal.  Hospital Course by problem list: #1. Probable PNA - upon admission the patient had a chest x-ray done which showed a low lung volumes with diffuse bilateral airspace disease-edema or pneumonia can have this appearance ( per radiologist ). Blood cultures were obtained as well and urine Legionella and pneumococcus antigens and the patient was started on empiric antibiotics of for pneumonia. The patient was also placed on mucolytics and supplemental oxygen. 1/2 blood cultures  grew Acinetobacter that was resistant to the quinolones but had  intermediate sensitivity to this  a cephalosporins. The patient's antibiotics were adjusted accordingly  and but she remained afebrile, hemodynamically stable and without leukocytosis. Cough was persisting so she was otherwise clinically improved and so arm intact pulses were added. She is clinically improved at this time, her urine Legionella and pneumococcal antigens came back negative. She is medically stable for discharge to nursing facility at this time she is to follow up outpatient. Followup chest x-ray showed improvement. #2. Volume overload/ Acute Diastolic CHF- per shortness of breath was slow to improve following admission and her chest x-ray as above consistent with pneumonia versus edema, a brain natruretic peptide was done and it came back elevated at 1062. The patient was diuresed with IV Lasix and subsequently this was changed to by mouth Lasix. Arm with diuresis her symptoms continue to improve and he and her brain natruretic peptide improved to 358. Patient had a 2-D echocardiogram done and it showed grade 1 diastolic dysfunction, ejection fraction was 65-70%. The patient was placed on a low-dose beta blocker and which she is to  continue upon discharge. #3. COPD - she was placed on nebulized bronchodilators, antibiotics and supplemental oxygen. Subsequently in the hospital she was noted to be wheezing on exam and and so he was started on a prednisone taper and she responded well. She was maintained on mucolytics. Her O2 sats on room air today prior to discharge at 87% and she meets criteria for  home O2 upon discharge. She is to followup with [facility] pulmonology upon discharge #4Acinetbacter bacteremia- 1/2 bottles as discussed above, the patient is to complete a total of 2weeks of abx. She has been afebrile hemodynamically stable with no leukocytosis.  #5Hypoxia - secondary to above, management as above.  #6. HTN stable. Continue current regimen.  #7. PMR- she was on prednisone 3 mg prior to admission at the zone is to be tapered down as directed to that maintenance dose.  #8. Anemia- secondary to AOCD/iron def anemia.  #9. Falls- PTOT followed patient in the hospital and skilled nursing was recommended for rehabilitation.  #10 DM - she was maintained on  levemir, SSI which she is to continue upon discharge.   #11 Renal mass- patient had had an MRI done on 01/12/2011-during her previous hospitalization and she is to follow up with Dr. Annabell Howells her urologist nursing home to call for appointment upon discharge followup in about a week. #12 E. Coli UTI -her urine cultures grew Escherichia coli sensitive to cephalosporins she has received adequate treatment for UTI but will be on antibiotic upon discharge for #4 as above.       Discharge Vitals:  BP 104/56  Pulse 70  Temp(Src) 97.5 F (36.4 C) (Oral)  Resp 20  Ht 5\' 2"  (1.575 m)  Wt 73.573 kg (162 lb 3.2 oz)  BMI 29.67 kg/m2  SpO2 97%  Discharge Labs:  Results for orders placed during the hospital encounter of 01/30/11 (from the past 24 hour(s))  GLUCOSE, CAPILLARY     Status: Abnormal   Collection Time   02/06/11  4:25 PM      Component Value Range    Glucose-Capillary 136 (*) 70 - 99 (mg/dL)   Comment 1 Notify RN    GLUCOSE, CAPILLARY     Status: Abnormal   Collection Time   02/06/11  9:38 PM      Component Value Range   Glucose-Capillary 258 (*) 70 - 99 (mg/dL)  BASIC METABOLIC PANEL     Status: Abnormal   Collection Time  02/07/11  6:00 AM      Component Value Range   Sodium 131 (*) 135 - 145 (mEq/L)   Potassium 4.5  3.5 - 5.1 (mEq/L)   Chloride 90 (*) 96 - 112 (mEq/L)   CO2 35 (*) 19 - 32 (mEq/L)   Glucose, Bld 253 (*) 70 - 99 (mg/dL)   BUN 37 (*) 6 - 23 (mg/dL)   Creatinine, Ser 4.69  0.50 - 1.10 (mg/dL)   Calcium 9.8  8.4 - 62.9 (mg/dL)   GFR calc non Af Amer 75 (*) >90 (mL/min)   GFR calc Af Amer 87 (*) >90 (mL/min)  GLUCOSE, CAPILLARY     Status: Abnormal   Collection Time   02/07/11  7:28 AM      Component Value Range   Glucose-Capillary 235 (*) 70 - 99 (mg/dL)   Comment 1 Notify RN    GLUCOSE, CAPILLARY     Status: Abnormal   Collection Time   02/07/11 11:34 AM      Component Value Range   Glucose-Capillary 262 (*) 70 - 99 (mg/dL)    Signed: Donnalee Curry C 02/07/2011, 1:35 PM

## 2011-02-07 NOTE — Progress Notes (Signed)
Patient discharged to SNF, alert and oriented, transferred by [facility], discharge package given to [facility] for delivery with patient to unit, patient in stable condition at this time, family at bedside.

## 2011-02-07 NOTE — Progress Notes (Signed)
Physical Therapy Treatment Patient Details Name: [name] MRN: [id number] DOB: April 16, 1924 Today's Date: 02/07/2011 Time: 1411-1430 Charge: Leonia Reeves PT Assessment/Plan  PT - Assessment/Plan Comments on Treatment Session: Pt ambulated to toilet for BM and then able to ambulate in hallway a short distance 2* fatigue and SOB.  Pt assisted to supine position. PT Plan: Discharge plan remains appropriate;Frequency remains appropriate Follow Up Recommendations: Skilled nursing facility Equipment Recommended: Defer to next venue PT Goals  Acute Rehab PT Goals PT Goal: Sit to Supine/Side - Progress: Progressing toward goal PT Goal: Ambulate - Progress: Progressing toward goal  PT Treatment Precautions/Restrictions  Precautions Precautions: Fall Precaution Comments: pt had episodes of falls at home recently Restrictions Weight Bearing Restrictions: No Mobility (including Balance) Bed Mobility Bed Mobility: Yes Sit to Supine - Right: 5: Supervision;HOB flat Sit to Supine - Right Details (indicate cue type and reason): increased time Transfers Transfers: Yes Sit to Stand: 4: Min assist;With upper extremity assist;From toilet;From chair/3-in-1 Sit to Stand Details (indicate cue type and reason): verbal cues for safe technique, assist from weakness Stand to Sit: 4: Min assist;Without upper extremity assist;To toilet;To bed Stand to Sit Details: verbal and manual cues to control descent, verbal cues for staying with RW Ambulation/Gait Ambulation/Gait: Yes Ambulation/Gait Assistance: 4: Min assist Ambulation/Gait Assistance Details (indicate cue type and reason): min/guard, pt 92% SaO2 on 2.5 L at rest so ambulated on 3L.  SaO2 unable to read with ambulation but upon return to room 91% on 3L.  Pt 93% on 2.5 L O2 upon leaving room.  verbal cues for posture and safe RW distance Ambulation Distance (Feet): 60 Feet Assistive device: Rolling walker Gait Pattern: Trunk flexed Gait velocity: slow  cadence    Exercise    End of Session PT - End of Session Equipment Utilized During Treatment: Gait belt Activity Tolerance: Patient limited by fatigue Patient left: in bed;with call bell in reach General Behavior During Session: [facility] for tasks performed Cognition: [facility] for tasks performed  Anavictoria Wilk,KATHrine E 02/07/2011, 3:18 PM Pager: [phone]

## 2011-02-07 NOTE — Progress Notes (Signed)
LM for [name], SW at [facility], regarding Abx.  Pt not needing IV Abx; only oral Zithromax.    Called [facility].  Informed that [name] is in a family meeting and will contact CSW when available.  Spoke with Pt's family, [place].  Neysa Bonito asking that CSW contact her if bed available at [facility] at [phone].  Per RN, Pt needing O2 upon D/c.  Spoke with Revonda Standard.  Relayed information to [facility].  Per [name], Pt accepted and bed offered.  Notified MD, RN and family.  Faxed documents to facility.  Contacted EMS.  Pt to d/c today.   Providence Crosby, LCSWA Clinical Social Work [phone]

## 2011-02-09 ENCOUNTER — Telehealth: Payer: Self-pay | Admitting: Oncology

## 2011-02-09 ENCOUNTER — Ambulatory Visit (HOSPITAL_BASED_OUTPATIENT_CLINIC_OR_DEPARTMENT_OTHER): Payer: Medicare Other | Admitting: Oncology

## 2011-02-09 ENCOUNTER — Encounter: Payer: Self-pay | Admitting: Oncology

## 2011-02-09 VITALS — BP 122/77 | HR 82 | Temp 99.0°F | Ht 62.0 in | Wt 165.4 lb

## 2011-02-09 DIAGNOSIS — N289 Disorder of kidney and ureter, unspecified: Secondary | ICD-10-CM

## 2011-02-09 DIAGNOSIS — F29 Unspecified psychosis not due to a substance or known physiological condition: Secondary | ICD-10-CM

## 2011-02-09 DIAGNOSIS — D693 Immune thrombocytopenic purpura: Secondary | ICD-10-CM | POA: Insufficient documentation

## 2011-02-09 DIAGNOSIS — G459 Transient cerebral ischemic attack, unspecified: Secondary | ICD-10-CM

## 2011-02-09 DIAGNOSIS — M1710 Unilateral primary osteoarthritis, unspecified knee: Secondary | ICD-10-CM | POA: Insufficient documentation

## 2011-02-09 DIAGNOSIS — D708 Other neutropenia: Secondary | ICD-10-CM

## 2011-02-09 DIAGNOSIS — Z862 Personal history of diseases of the blood and blood-forming organs and certain disorders involving the immune mechanism: Secondary | ICD-10-CM

## 2011-02-09 DIAGNOSIS — M1990 Unspecified osteoarthritis, unspecified site: Secondary | ICD-10-CM

## 2011-02-09 DIAGNOSIS — G5601 Carpal tunnel syndrome, right upper limb: Secondary | ICD-10-CM | POA: Insufficient documentation

## 2011-02-09 DIAGNOSIS — N2889 Other specified disorders of kidney and ureter: Secondary | ICD-10-CM

## 2011-02-09 DIAGNOSIS — M47812 Spondylosis without myelopathy or radiculopathy, cervical region: Secondary | ICD-10-CM

## 2011-02-09 DIAGNOSIS — M169 Osteoarthritis of hip, unspecified: Secondary | ICD-10-CM

## 2011-02-09 HISTORY — DX: Other neutropenia: D70.8

## 2011-02-09 HISTORY — DX: Immune thrombocytopenic purpura: D69.3

## 2011-02-09 NOTE — Telephone Encounter (Signed)
NO I DON"T!!!! I just went over this with Radiologist yesterday - they no nothing about this new with/without contrast order - who is telling you this?  They need to speak with the Radiologists - I spoke with Dr Francene Boyers.

## 2011-02-09 NOTE — Progress Notes (Signed)
[facility]  HEMATOLOGY/MEDICAL ONCOLOGY [address] [place], [place]  [address] Phone:  [phone] Fax:  [phone]   OFFICE PROGRESS NOTE  NAME: [name], KRAH DATE:  04/04/2006 MRN: [id number] DOB:  1925/01/11  CC: Dr. Murray Owens   Dr. Lennox Owens   Dr. Annell Owens   [name] followed here since initial hospital consult back in June 2002 when she presented with idiopathic thrombocytopenia and neutropenia.  She was subsequently found to have a collagen vascular disorder (temporal arteritis and polymyalgia rheumatica).  She has been followed by  Dr. Coral Owens.  She has been on and off steroids over the years.  She is currently on prednisone 4 mg daily.    She achieved a durable response to initial steroids given back in 2002, and her counts have been overall stable since that time.  Platelet counts are running in the 125,000 to 135,000 range.  White counts have normalized.  Over the last 2 years, platelet counts have been consistently above 170,000.    At present, her rheumatic complaints are minimum with no major muscle or joint aches.  She is hoping  Dr. Coral Owens can cut her steroids further.  The only interim medical problem was 2 surgeries on her neck for cervical disk problems, most recent surgery done 02/20/2006.  She also tells me she has developed bilateral carpal tunnel syndrome.    She denies any headache or change in vision.  No dyspnea.  No chest pain or pressure.  No palpitations.  No change in bowel habit.  PHYSICAL EXAM:  Weight up to 197 pounds.  Blood pressure 124/76.  She is afebrile.  Head and neck are normal.  Lungs are clear.  Regular cardiac rhythm.  No murmur.  No lymphadenopathy.  Abdomen: Soft, obese, no mass, no organomegaly.  Extremities:  No edema.    LAB:  Hemoglobin 12, hematocrit 37, MCV 99.  White count 8800 with 64 neutrophil, 24 lymphocyte, and platelet count 181,000.    IMPRESSION:   History of immune thrombocytopenia and neutropenia.  Counts stable for many years on low-dose prednisone.    PLAN:  I told her at this point she really does not need to follow up in our office.  I will be happy to see her in the future if the need arises.     Electronically signed [name]. Cyndie Chime, M.D., F.A.C.P. J[facility]/[employer] Word1.doc   02/09/11: Reevaluation for this now 77 year old woman who have not seen in 5 years. She now has a new problem. I first saw her in consultation back in 2002 when she presented with neutropenia and thrombocytopenia. This was found to be on an immune basis and a diagnosis of temporal arteritis and polymyalgia rheumatica was established. She had a nice response to steroids which were eventually tapered and stopped.  She recently presented to the [facility] emergency department on 01/10/11 with cough, wheezing, and confusion. She was found to be hypoxic. D.-dimer was elevated. A CT angiogram of the chest was done which did not show any evidence for pulmonary emboli. Incidentally noted was a small left thyroid nodule with peripheral calcification 11 mm and cuts through the upper abdomen showed a approximate 2 cm lesion in the upper pole of the right kidney. Phone consultation obtained with urology Dr. Patsi Owens. MRI scan of the abdomen was done with contrast 01/12/11 and showed that the 1.7 x 1.8 cm lesion within the upper right renal cortex was a solid enhancing lesions suggestive of  a renal cell carcinoma. The patient requested that she have a consultation with me to review these data and make recommendations.   As noted above, the renal lesion was found incidentally. She has had no flank pain and no hematuria. In addition to the CT chest and the MRI abdomen she also had a brain scan due to the confusion. There is no evidence that she has any metastatic cancer.  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 Hypertension. No history of MI. She is a diabetic and has been on insulin for 8 years. Remote peptic ulcer disease not currently on medication. Probable transient ischemic attack 6 weeks ago with transient left foot and weakness. Urinary incontinence with a recent trial of Vesicare stopped for excessive fluid retention. No known history of thyroid disease hepatitis yellow jaundice kidney stones seizures blood clots. She has advanced degenerative arthritis. She has had bilateral total knee replacements, left hip replacement, cervical spine and fixation procedure or by Dr. Annell Owens. Surgery on both of her elbows. Right carpal tunnel surgery. Bilateral cataracts. Recent \\re  hospitalization for pneumonia. Diabetic peripheral neuropathy on Neurontin.  Medications reviewed in Epic Intolerance to codeine and morphine.  Family history: A brother died of myeloma; she had 2 sisters one is deceased.  Social history She is a widow. She used to smoke but stopped in 1984. She has a daughter age 80 with lung cancer a daughter and a son both with obstructive airway disease and her daughter is on oxygen one daughter with ITP.  Review of systems: She is recuperating from recent pneumonia. [facility] while she recovers. She is currently on oxygen and steroids as well as bronchodilators. No ischemic type chest pain or pressure. No history of MI. No abdominal pain no change in bowel habit. Chronic arthritis pain. Transient neurologic deficits which occurred 6 weeks ago have resolved. She did have a CT brain and was told this was negative for stroke. She is taking one aspirin a day 325 mg.  Physical exam Elderly Caucasian female who appears well-nourished Vital signs as recorded Pharynx no erythema or exudate neck is supple no thyromegaly Lungs are clear and resonant to percussion Regular cardiac rhythm no murmur gallop or rub No lymphadenopathy in the neck  supraclavicular or axillary regions. Abdomen is soft nontender no mass no organomegaly. Extremities with no edema no calf tenderness. Neurologic mental status intact cranial nerves grossly normal motor strength 5 over 5 reflexes absent symmetric.  Impression Solitary 2 cm right upper pole kidney lesion. She is asymptomatic and this lesion was found incidentally on cuts through the upper abdomen on a CT angiogram of the chest. She is not having any hematuria or flank pain. She is 86 and recovering from pneumonia. We discussed treatment options. I'm going to make a referral to Dr. Patsi Owens, urology. He will be the best one to review surgical options. She has no contraindications to a partial nephrectomy. Other options would include radiofrequency ablation procedure. I am also comfortable with a period of observation with reevaluation in 3 months. These tumors are usually very slow growing. There is no evidence for metastatic spread at this point. One thing I would not recommend is a needle biopsy.

## 2011-02-23 ENCOUNTER — Encounter (HOSPITAL_COMMUNITY): Payer: Self-pay | Admitting: Adult Health

## 2011-02-23 ENCOUNTER — Other Ambulatory Visit: Payer: Self-pay | Admitting: Oncology

## 2011-02-23 ENCOUNTER — Emergency Department (HOSPITAL_COMMUNITY)
Admission: EM | Admit: 2011-02-23 | Discharge: 2011-02-24 | Disposition: A | Discharge status: Home / Self Care | Payer: Medicare Other | Attending: Emergency Medicine | Admitting: Emergency Medicine

## 2011-02-23 DIAGNOSIS — E119 Type 2 diabetes mellitus without complications: Secondary | ICD-10-CM | POA: Insufficient documentation

## 2011-02-23 DIAGNOSIS — R739 Hyperglycemia, unspecified: Secondary | ICD-10-CM

## 2011-02-23 DIAGNOSIS — N2889 Other specified disorders of kidney and ureter: Secondary | ICD-10-CM

## 2011-02-23 DIAGNOSIS — R109 Unspecified abdominal pain: Secondary | ICD-10-CM | POA: Insufficient documentation

## 2011-02-23 DIAGNOSIS — R011 Cardiac murmur, unspecified: Secondary | ICD-10-CM | POA: Insufficient documentation

## 2011-02-23 DIAGNOSIS — I10 Essential (primary) hypertension: Secondary | ICD-10-CM | POA: Insufficient documentation

## 2011-02-23 DIAGNOSIS — Z8673 Personal history of transient ischemic attack (TIA), and cerebral infarction without residual deficits: Secondary | ICD-10-CM | POA: Insufficient documentation

## 2011-02-23 DIAGNOSIS — M810 Age-related osteoporosis without current pathological fracture: Secondary | ICD-10-CM | POA: Insufficient documentation

## 2011-02-23 DIAGNOSIS — Z794 Long term (current) use of insulin: Secondary | ICD-10-CM | POA: Insufficient documentation

## 2011-02-23 DIAGNOSIS — Z79899 Other long term (current) drug therapy: Secondary | ICD-10-CM | POA: Insufficient documentation

## 2011-02-23 DIAGNOSIS — M129 Arthropathy, unspecified: Secondary | ICD-10-CM | POA: Insufficient documentation

## 2011-02-23 NOTE — ED Notes (Signed)
Pt to ED with unsure of how much insulin or what kind of insulin she took today.Pt states that yesterday she missed her dose of insulin b/c she didn't feel good and today decided that she should take more than normal, pt unsure of how much was taken or which of her insulin's she took. Pt with no complaints at this time

## 2011-02-23 NOTE — ED Notes (Signed)
Pt states her CBG at 1830 was 428 and pt gave herself 30 units of levemir. PT is ordered to take 40 units of levemir at night. Pt states she did not take more that 30 units of levemir, family is concerned because she gets confused on her medication.. CBG 183

## 2011-02-24 LAB — GLUCOSE, CAPILLARY: Glucose-Capillary: 126 mg/dL — ABNORMAL HIGH (ref 70–99)

## 2011-02-24 NOTE — ED Notes (Signed)
Pt provided sandwich and a soda to eat, cont to monitor, denies needs

## 2011-02-24 NOTE — ED Provider Notes (Addendum)
History     CSN: [id number] Arrival date & time: 02/23/2011  8:25 PM   First MD Initiated Contact with Patient 02/23/11 2137      Chief Complaint  Patient presents with  . Medication Reaction    (Consider location/radiation/quality/duration/timing/severity/associated sxs/prior treatment) The history is provided by the patient.   patient is 77 year old female with history of diabetes recently had a change in her insulin regiment uses long-acting insulin and uses a new sliding scale to supplement that with regular as needed. It is clearly talking with the patient and her elderly husband in a confused about how to use the new sliding scale. Today at 6:30 in the evening her blood sugar was 428 so she supplemented that with her long-acting insulin and then they get concerned that her blood sugar would get too low on arrival here blood sugar was 183 patient did not have any symptoms still feels fine.  Past Medical History  Diagnosis Date  . Diabetes mellitus   . HTN (hypertension)   . Diabetes mellitus   . Polymyalgia rheumatica   . Arthritis   . Cataract   . Osteoporosis   . Cancer   . 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 Degenerative arthritis of cervical spine 02/09/2011  . Degenerative arthritis of hip 02/09/2011  . Degenerative arthritis of knee 02/09/2011  . Carpal tunnel syndrome of right wrist 02/09/2011  . TIA (transient ischemic attack) 02/09/2011    Past Surgical History  Procedure Date  . Joint replacement   . Back surgery     Family History  Problem Relation Age of Onset  . Leukemia      History  Substance Use Topics  . Smoking status: Former Smoker    Types: Cigarettes    Quit date: 01/11/1983  . Smokeless tobacco: Not on file  . Alcohol Use: No    OB History    Grav Para Term Preterm Abortions TAB SAB Ect Mult Living                  Review of Systems  Constitutional: Negative for fever and fatigue.  HENT:  Negative for congestion and neck pain.   Eyes: Negative for visual disturbance.  Respiratory: Negative for cough and shortness of breath.   Cardiovascular: Negative for chest pain.  Gastrointestinal: Negative for nausea, vomiting, abdominal pain and diarrhea.  Genitourinary: Negative for dysuria and hematuria.  Musculoskeletal: Negative for back pain.  Skin: Negative for rash.  Neurological: Negative for speech difficulty, weakness and headaches.  Psychiatric/Behavioral: Negative for confusion.    Allergies  Codeine and Morphine and related  Home Medications   Current Outpatient Rx  Name Route Sig Dispense Refill  . ACYCLOVIR 200 MG PO CAPS Oral Take 200 mg by mouth daily.     . ALBUTEROL SULFATE (5 MG/ML) 0.5% IN NEBU Nebulization Take 0.5 mLs (2.5 mg total) by nebulization every 6 (six) hours. 200 mL 0  . AMITRIPTYLINE HCL 50 MG PO TABS Oral Take 0.5 tablets (25 mg total) by mouth at bedtime. 30 tablet 0  . ASPIRIN EC 325 MG PO TBEC Oral Take 325 mg by mouth daily.      . FUROSEMIDE 40 MG PO TABS Oral Take 1 tablet (40 mg total) by mouth daily. 30 tablet 0  . GABAPENTIN 300 MG PO CAPS Oral Take 1 capsule (300 mg total) by mouth 3 (three) times daily. 30 capsule 0  . INSULIN ASPART 100 UNIT/ML [place] SOLN Subcutaneous  Inject 0-15 Units into the skin at bedtime. Sugar level less than 150 do not take any Novolog, if sugar level is between 151-200 take 3 units, 201-250 take 5 units, 251-300 take 7 units 301-350 take 9 units, 351-400 take 11 units, sugar level over 400 take 15 units and notify docotor     . INSULIN DETEMIR 100 UNIT/ML [place] SOLN Subcutaneous Inject 40 Units into the skin at bedtime.     . IPRATROPIUM BROMIDE 0.02 % IN SOLN Nebulization Take 2.5 mLs (0.5 mg total) by nebulization every 6 (six) hours. 75 mL 0  . METOPROLOL TARTRATE 12.5 MG HALF TABLET Oral Take 0.5 tablets (12.5 mg total) by mouth 2 (two) times daily. 60 tablet 0  . THERA M PLUS PO TABS Oral Take 1 tablet by mouth  daily.      Marland Kitchen POTASSIUM CHLORIDE CR 10 MEQ PO TBCR Oral Take 2 tablets (20 mEq total) by mouth daily. 30 tablet 0  . PREDNISONE (PAK) 10 MG PO TABS  Take 2 tablets daily for 5 days, then1 tablet daily for 5 days, then half a tablet daily for 5 days, then resume her maintenance dose of 3 mg daily long-term as previously for PMR 18 tablet 0  . SOLIFENACIN SUCCINATE 10 MG PO TABS Oral Take 10 mg by mouth daily.     Marland Kitchen PREDNISONE 1 MG PO TABS Oral Take 3 tablets (3 mg total) by mouth daily. 3 tablet 0    Please note that this 3 mg dose is to be started w ...    BP 106/53  Pulse 87  Temp(Src) 98 F (36.7 C) (Oral)  Resp 19  SpO2 95%  Physical Exam  Nursing note and vitals reviewed. Constitutional: She is oriented to person, place, and time. She appears well-developed.  HENT:  Head: Normocephalic and atraumatic.  Mouth/Throat: Oropharynx is clear and moist.  Eyes: Conjunctivae and EOM are normal. Pupils are equal, round, and reactive to light.  Neck: Normal range of motion. Neck supple.  Cardiovascular: Normal rate and regular rhythm.   Murmur heard. Pulmonary/Chest: Effort normal and breath sounds normal.  Abdominal: Soft. Bowel sounds are normal. There is tenderness.  Musculoskeletal: Normal range of motion.  Neurological: She is alert and oriented to person, place, and time. No cranial nerve deficit. She exhibits normal muscle tone. Coordination normal.  Skin: Skin is warm. No rash noted.    ED Course  Procedures (including critical care time)  Labs Reviewed  GLUCOSE, CAPILLARY - Abnormal; Notable for the following:    Glucose-Capillary 140 (*)    All other components within normal limits  GLUCOSE, CAPILLARY - Abnormal; Notable for the following:    Glucose-Capillary 114 (*)    All other components within normal limits  POCT CBG MONITORING  POCT CBG MONITORING  POCT CBG MONITORING   Results for orders placed during the hospital encounter of 02/23/11  GLUCOSE, CAPILLARY       Component Value Range   Glucose-Capillary 140 (*) 70 - 99 (mg/dL)  GLUCOSE, CAPILLARY      Component Value Range   Glucose-Capillary 114 (*) 70 - 99 (mg/dL)   Results for orders placed during the hospital encounter of 02/23/11  GLUCOSE, CAPILLARY      Component Value Range   Glucose-Capillary 140 (*) 70 - 99 (mg/dL)  GLUCOSE, CAPILLARY      Component Value Range   Glucose-Capillary 114 (*) 70 - 99 (mg/dL)  GLUCOSE, CAPILLARY      Component Value Range  Glucose-Capillary 126 (*) 70 - 99 (mg/dL)   Comment 1 Notify RN       1. Hyperglycemia       MDM   Patient with known diabetes and today had significant hyperglycemia around 1830 blood sugar was 428 patient is supposed to be using a sliding scale to supplement her long-acting insulin but confused and gave her self long-acting insulin. She is elderly and lives with her elderly husband. Blood sugar upon arrival to the emergency department was 183 patient asymptomatic repeat blood sugar was 140 and then repeat blood sugar around 11:30 tonight was 114 she still asymptomatic.   Discussed with her and her husband to follow a sliding scale and use the regular insulin for that not the long-acting and to followup with her doctor for the diabetes.   At midnight decided to give patient a snack and will recheck fingerstick blood sugar to make sure is not continuing to drift down if it is she will require admission. Patient possibly could do fine since blood sugar is slowly coming down as been no drastic drops but has come down a long way from the blood sugar of 420 10[birth year]10[birth year]1928 in the evening.  The blood sugar continues to drop may require admission for observation.  After snack blood sugar has gone up into the 120 range patient sits be saved to go home she doesn't use tobacco she first gets home.      Shelda Jakes, MD 02/24/11 1610  Shelda Jakes, MD 02/25/11 [phone]

## 2011-05-17 ENCOUNTER — Ambulatory Visit: Payer: Medicare Other | Admitting: Oncology

## 2011-05-17 ENCOUNTER — Telehealth: Payer: Self-pay | Admitting: Oncology

## 2011-05-20 ENCOUNTER — Ambulatory Visit: Payer: Medicare Other | Admitting: Oncology

## 2011-05-27 ENCOUNTER — Ambulatory Visit: Payer: Medicare Other

## 2011-05-27 ENCOUNTER — Ambulatory Visit (INDEPENDENT_AMBULATORY_CARE_PROVIDER_SITE_OTHER): Payer: Medicare Other | Admitting: Family Medicine

## 2011-05-27 VITALS — BP 111/72 | HR 93 | Temp 98.1°F | Resp 20 | Ht 62.0 in | Wt 171.8 lb

## 2011-05-27 DIAGNOSIS — R0602 Shortness of breath: Secondary | ICD-10-CM

## 2011-05-27 DIAGNOSIS — J189 Pneumonia, unspecified organism: Secondary | ICD-10-CM

## 2011-05-27 DIAGNOSIS — R05 Cough: Secondary | ICD-10-CM

## 2011-05-27 DIAGNOSIS — R059 Cough, unspecified: Secondary | ICD-10-CM

## 2011-05-27 DIAGNOSIS — R300 Dysuria: Secondary | ICD-10-CM

## 2011-05-27 DIAGNOSIS — N390 Urinary tract infection, site not specified: Secondary | ICD-10-CM

## 2011-05-27 LAB — POCT URINALYSIS DIPSTICK
Bilirubin, UA: NEGATIVE
Blood, UA: NEGATIVE
Glucose, UA: NEGATIVE
Ketones, UA: NEGATIVE
Nitrite, UA: NEGATIVE
Protein, UA: NEGATIVE
Spec Grav, UA: <=1.005
Urobilinogen, UA: 0.2
pH, UA: 5.5

## 2011-05-27 LAB — POCT CBC
Granulocyte percent: 71 % (ref 37–80)
HCT, POC: 37.6 % — AB (ref 37.7–47.9)
Hemoglobin: 12.1 g/dL — AB (ref 12.2–16.2)
Lymph, poc: 2.5 (ref 0.6–3.4)
MCH, POC: 30.3 pg (ref 27–31.2)
MCHC: 32.2 g/dL (ref 31.8–35.4)
MCV: 94.3 fL (ref 80–97)
MID (cbc): 0.8 (ref 0–0.9)
MPV: 8.5 fL (ref 0–99.8)
POC Granulocyte: 8 — AB (ref 2–6.9)
POC LYMPH PERCENT: 21.9 %L (ref 10–50)
POC MID %: 7.1 % (ref 0–12)
Platelet Count, POC: 197 10*3/uL (ref 142–424)
RBC: 3.99 M/uL — AB (ref 4.04–5.48)
RDW, POC: 16.5 %
WBC: 11.3 10*3/uL — AB (ref 4.6–10.2)

## 2011-05-27 LAB — POCT UA - MICROSCOPIC ONLY
Casts, Ur, LPF, POC: NEGATIVE
Crystals, Ur, HPF, POC: NEGATIVE
Mucus, UA: NEGATIVE
RBC, urine, microscopic: NEGATIVE
Yeast, UA: NEGATIVE

## 2011-05-27 MED ORDER — LEVOFLOXACIN 500 MG PO TABS: 500.0000 mg | ORAL_TABLET | Freq: Every day | ORAL | Status: AC

## 2011-05-27 MED ORDER — CEFTRIAXONE SODIUM 1 G IJ SOLR
1.0000 g | Freq: Once | INTRAMUSCULAR | Status: AC
  Administered 2011-05-27: 1 g via INTRAMUSCULAR

## 2011-05-27 NOTE — Progress Notes (Signed)
[facility]:  Office Visit  Chief Complaint:  Chief Complaint  Patient presents with  . Pneumonia    Cough/Nasla-green;chest congerstion;SOB x 4 dys    HPI: [name] is a 78 y.o. female who complains of  5 day history of green productive cough, SOB which has worsen; She is a former smoker of 2 ppd x 30 years. She is on home oxygen for COPD, she does have neb treatments she uses when she is SOB. She is already currently on Prednisone for PMR. Denies CP, HA, n/v, abd pain, ear pain,leg swelling, fevers, chills, sinus pressure, ear pain. Tried albuterol and atrovent nebs at home x 2 days with minimal relief. She also tried mucinex.   Past Medical History  Diagnosis Date  . Diabetes mellitus   . HTN (hypertension)   . Diabetes mellitus   . Polymyalgia rheumatica   . Arthritis   . Cataract   . Osteoporosis   . Cancer   . 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 Degenerative arthritis of cervical spine 02/09/2011  . Degenerative arthritis of hip 02/09/2011  . Degenerative arthritis of knee 02/09/2011  . Carpal tunnel syndrome of right wrist 02/09/2011  . TIA (transient ischemic attack) 02/09/2011  . COPD (chronic obstructive pulmonary disease)   . CHF (congestive heart failure)    Past Surgical History  Procedure Date  . Joint replacement   . Back surgery    History   Social History  . Marital Status: Widowed    Spouse Name: N/A    Number of Children: N/A  . Years of Education: N/A   Social History Main Topics  . Smoking status: Former Smoker    Types: Cigarettes    Quit date: 01/11/1983  . Smokeless tobacco: None  . Alcohol Use: No  . Drug Use: No  . Sexually Active: No   Other Topics Concern  . None   Social History Narrative  . None   Family History  Problem Relation Age of Onset  . Leukemia     Allergies  Allergen Reactions  . Codeine   . Morphine And Related Other (See Comments)    Pt not sure  of reaction to morphine   Prior to Admission medications   Medication Sig Start Date End Date Taking? Authorizing Provider  acyclovir (ZOVIRAX) 200 MG capsule Take 200 mg by mouth daily.    Yes Historical Provider, MD  albuterol (PROVENTIL) (5 MG/ML) 0.5% nebulizer solution Take 0.5 mLs (2.5 mg total) by nebulization every 6 (six) hours. 02/07/11 02/07/12 Yes Adeline Joselyn Glassman, MD  amitriptyline (ELAVIL) 50 MG tablet Take 0.5 tablets (25 mg total) by mouth at bedtime. 02/07/11  Yes Kela Millin, MD  aspirin EC 325 MG tablet Take 325 mg by mouth daily.     Yes Historical Provider, MD  furosemide (LASIX) 40 MG tablet Take 1 tablet (40 mg total) by mouth daily. 02/07/11 02/07/12 Yes Adeline C Viyuoh, MD  gabapentin (NEURONTIN) 300 MG capsule Take 1 capsule (300 mg total) by mouth 3 (three) times daily. 02/07/11  Yes Adeline C Viyuoh, MD  insulin aspart (NOVOLOG) 100 UNIT/ML injection Inject 0-15 Units into the skin at bedtime. Sugar level less than 150 do not take any Novolog, if sugar level is between 151-200 take 3 units, 201-250 take 5 units, 251-300 take 7 units 301-350 take 9 units, 351-400 take 11 units, sugar level over 400 take 15 units and notify docotor  02/07/11  02/07/12 Yes Adeline C Viyuoh, MD  insulin detemir (LEVEMIR) 100 UNIT/ML injection Inject 40 Units into the skin at bedtime.    Yes Historical Provider, MD  ipratropium (ATROVENT) 0.02 % nebulizer solution Take 2.5 mLs (0.5 mg total) by nebulization every 6 (six) hours. 02/07/11 02/07/12 Yes Adeline Joselyn Glassman, MD  metoprolol tartrate (LOPRESSOR) 12.5 mg TABS Take 0.5 tablets (12.5 mg total) by mouth 2 (two) times daily. 02/07/11  Yes Kela Millin, MD  Multiple Vitamins-Minerals (MULTIVITAMINS THER. W/MINERALS) TABS Take 1 tablet by mouth daily.     Yes Historical Provider, MD  potassium chloride (K-DUR) 10 MEQ tablet Take 2 tablets (20 mEq total) by mouth daily. 02/07/11 02/07/12 Yes Adeline C Viyuoh, MD  predniSONE (DELTASONE) 1 MG tablet  Take 3 tablets (3 mg total) by mouth daily. 02/22/11  Yes Adeline Joselyn Glassman, MD  predniSONE (STERAPRED UNI-PAK) 10 MG tablet Take 2 tablets daily for 5 days, then1 tablet daily for 5 days, then half a tablet daily for 5 days, then resume her maintenance dose of 3 mg daily long-term as previously for PMR 02/07/11  Yes Adeline C Viyuoh, MD  solifenacin (VESICARE) 10 MG tablet Take 10 mg by mouth daily.    Yes Historical Provider, MD     ROS: The patient denies fevers, chills, night sweats, unintentional weight loss, chest pain, palpitations,  nausea, vomiting, abdominal pain, dysuria, hematuria, melena, numbness, weakness, or tingling. + cough, sputum, wheeze, SOB  All other systems have been reviewed and were otherwise negative with the exception of those mentioned in the HPI and as above.    PHYSICAL EXAM: Filed Vitals:   05/27/11 1213  BP: 111/72  Pulse: 93  Temp: 98.1 F (36.7 C)  Resp: 20  Spo2 91% , then 94% after retake Filed Vitals:   05/27/11 1213  Height: 5\' 2"  (1.575 m)  Weight: 171 lb 12.8 oz (77.928 kg)   Body mass index is 31.42 kg/(m^2).  General: Alert, no acute distress, slightly tired appearing HEENT:  Normocephalic, atraumatic, oropharynx patent.  Cardiovascular:  Regular rate and rhythm, no rubs murmurs or gallops.  No Carotid bruits, radial pulse intact. No pedal edema.  Respiratory: Clear to auscultation bilaterally.  + wheezes, rales.  No cyanosis, no use of accessory musculature GI: No organomegaly, abdomen is soft and non-tender, positive bowel sounds.  No masses. Skin: No rashes. Neurologic: Facial musculature symmetric. Psychiatric: Patient is appropriate throughout our interaction. Lymphatic: No cervical lymphadenopathy Musculoskeletal: Gait intact.   LABS: Results for orders placed in visit on 05/27/11  POCT CBC      Component Value Range   WBC 11.3 (*) 4.6 - 10.2 (K/uL)   Lymph, poc 2.5  0.6 - 3.4    POC LYMPH PERCENT 21.9  10 - 50 (%L)   MID  (cbc) 0.8  0 - 0.9    POC MID % 7.1  0 - 12 (%M)   POC Granulocyte 8.0 (*) 2 - 6.9    Granulocyte percent 71.0  37 - 80 (%G)   RBC 3.99 (*) 4.04 - 5.48 (M/uL)   Hemoglobin 12.1 (*) 12.2 - 16.2 (g/dL)   HCT, POC 16.1 (*) 09.6 - 47.9 (%)   MCV 94.3  80 - 97 (fL)   MCH, POC 30.3  27 - 31.2 (pg)   MCHC 32.2  31.8 - 35.4 (g/dL)   RDW, POC 04.5     Platelet Count, POC 197  142 - 424 (K/uL)   MPV 8.5  0 -  99.8 (fL)  POCT UA - MICROSCOPIC ONLY      Component Value Range   WBC, Ur, HPF, POC 0-1     RBC, urine, microscopic negative     Bacteria, U Microscopic trace     Mucus, UA negative     Epithelial cells, urine per micros 0-4     Crystals, Ur, HPF, POC negative     Casts, Ur, LPF, POC negative     Yeast, UA negative    POCT URINALYSIS DIPSTICK      Component Value Range   Color, UA yellow     Clarity, UA clear     Glucose, UA negative     Bilirubin, UA negative     Ketones, UA negative     Spec Grav, UA <=1.005     Blood, UA negative     pH, UA 5.5     Protein, UA negative     Urobilinogen, UA 0.2     Nitrite, UA negative     Leukocytes, UA Trace       EKG/XRAY:   Primary read interpreted by Dr. Conley Rolls at [facility] ?Right lower lobe infiltrate   ASSESSMENT/PLAN: Encounter Diagnoses  Name Primary?  . SOB (shortness of breath) Yes  . Cough   . Dysuria   . Pneumonia   . UTI (urinary tract infection)    1. Patient has ? RLL PNA based on sxs and questionable xray. Rx Levaquin 500 mg PO daily x 7 days. F/u with Dr. Chilton Si next week. Patient to use nebs q4hrs, c/w mucinex without D. Currently already on prednisone so will not give her steroids, her oxygen saturation based on prior records is stable between 91-94%. Last hospitalization for COPD exacerbation in 02/2011  O2 sat was 87%.  Advise to repeat Xray in 3-4 weeks with PCP or [facility]. I spoke with her son who is at the bedside, I am not sure how much of this they both understand but will call them tomorrow  to check on her.   2.  UTI- will cx and advise to c/w rocephin IM rx by Dr. Chilton Si ( her PCP) for UTI to be given by home health nurse since sensitve to E.Coli and not other oral  abx. Patient states that she did not get her Rocephin injection today because pharmacy was not able to fill order so I gave  her one in the office.       Hamilton Capri PHUONG, DO 05/27/2011 2:53 PM

## 2011-05-28 ENCOUNTER — Telehealth: Payer: Self-pay | Admitting: Family Medicine

## 2011-05-28 NOTE — Telephone Encounter (Incomplete)
Spoke with patient regarding xray results and also her ceftriaxone injectsions. It's a little confusing. Asked pt to continue with ceftriaxone Patient has E coli in urine sensitive to ceftriaxone

## 2011-05-30 ENCOUNTER — Encounter: Payer: Self-pay | Admitting: Family Medicine

## 2011-05-30 LAB — URINE CULTURE: Colony Count: >=100000

## 2011-06-03 ENCOUNTER — Telehealth: Payer: Self-pay | Admitting: Family Medicine

## 2011-06-03 NOTE — Telephone Encounter (Signed)
Spoke with patient she is feeling much better, no more cough and SOB. She is finally getting injections for ceftriaxone regular, has had 2 injections from [facility] nurse already in addition to the one we gave her in the office that wouldl be 3. Told her results of Urine cx and sensitivity.  She will f/u with Dr. Mauri Brooklyn her treatment.

## 2011-07-15 ENCOUNTER — Other Ambulatory Visit: Payer: Self-pay | Admitting: Urology

## 2011-07-15 DIAGNOSIS — D49519 Neoplasm of unspecified behavior of unspecified kidney: Secondary | ICD-10-CM

## 2011-07-26 ENCOUNTER — Ambulatory Visit
Admission: RE | Admit: 2011-07-26 | Discharge: 2011-07-26 | Disposition: A | Discharge status: Home / Self Care | Payer: Medicare Other | Source: Ambulatory Visit | Attending: Urology | Admitting: Urology

## 2011-07-26 DIAGNOSIS — D49519 Neoplasm of unspecified behavior of unspecified kidney: Secondary | ICD-10-CM

## 2011-07-26 MED ORDER — GADOBENATE DIMEGLUMINE 529 MG/ML IV SOLN
16.0000 mL | Freq: Once | INTRAVENOUS | Status: AC | PRN
  Administered 2011-07-26: 16 mL via INTRAVENOUS

## 2011-07-27 ENCOUNTER — Other Ambulatory Visit: Payer: Self-pay | Admitting: Urology

## 2011-07-27 DIAGNOSIS — N2889 Other specified disorders of kidney and ureter: Secondary | ICD-10-CM

## 2011-08-10 ENCOUNTER — Ambulatory Visit
Admission: RE | Admit: 2011-08-10 | Discharge: 2011-08-10 | Disposition: A | Discharge status: Home / Self Care | Payer: Medicare Other | Source: Ambulatory Visit | Attending: Urology | Admitting: Urology

## 2011-08-10 DIAGNOSIS — N2889 Other specified disorders of kidney and ureter: Secondary | ICD-10-CM

## 2011-12-29 ENCOUNTER — Other Ambulatory Visit: Payer: Self-pay | Admitting: Interventional Radiology

## 2011-12-29 DIAGNOSIS — N2889 Other specified disorders of kidney and ureter: Secondary | ICD-10-CM

## 2012-01-11 ENCOUNTER — Other Ambulatory Visit: Payer: Self-pay | Admitting: Emergency Medicine

## 2012-01-11 DIAGNOSIS — N2889 Other specified disorders of kidney and ureter: Secondary | ICD-10-CM

## 2012-01-12 LAB — CREATININE WITH EST GFR: GFR, Est African American: 68 mL/min

## 2012-02-01 ENCOUNTER — Ambulatory Visit (HOSPITAL_COMMUNITY)
Admission: RE | Admit: 2012-02-01 | Discharge: 2012-02-01 | Disposition: A | Discharge status: Home / Self Care | Payer: Medicare Other | Source: Ambulatory Visit | Attending: Interventional Radiology | Admitting: Interventional Radiology

## 2012-02-01 ENCOUNTER — Ambulatory Visit
Admission: RE | Admit: 2012-02-01 | Discharge: 2012-02-01 | Disposition: A | Discharge status: Home / Self Care | Payer: Medicare Other | Source: Ambulatory Visit | Attending: Interventional Radiology | Admitting: Interventional Radiology

## 2012-02-01 VITALS — BP 112/75 | HR 99 | Temp 98.3°F | Resp 15

## 2012-02-01 DIAGNOSIS — J4489 Other specified chronic obstructive pulmonary disease: Secondary | ICD-10-CM | POA: Insufficient documentation

## 2012-02-01 DIAGNOSIS — K8020 Calculus of gallbladder without cholecystitis without obstruction: Secondary | ICD-10-CM | POA: Insufficient documentation

## 2012-02-01 DIAGNOSIS — I509 Heart failure, unspecified: Secondary | ICD-10-CM | POA: Insufficient documentation

## 2012-02-01 DIAGNOSIS — J449 Chronic obstructive pulmonary disease, unspecified: Secondary | ICD-10-CM | POA: Insufficient documentation

## 2012-02-01 DIAGNOSIS — K7460 Unspecified cirrhosis of liver: Secondary | ICD-10-CM | POA: Insufficient documentation

## 2012-02-01 DIAGNOSIS — N289 Disorder of kidney and ureter, unspecified: Secondary | ICD-10-CM | POA: Insufficient documentation

## 2012-02-01 DIAGNOSIS — N2889 Other specified disorders of kidney and ureter: Secondary | ICD-10-CM

## 2012-02-01 MED ORDER — IOHEXOL 300 MG/ML  SOLN
100.0000 mL | Freq: Once | INTRAMUSCULAR | Status: AC | PRN
  Administered 2012-02-01: 100 mL via INTRAVENOUS

## 2012-02-01 NOTE — Progress Notes (Signed)
Denies hematuria.  Does experience frequent urination.  Patient taking Lasix QOD (instead of qd as Rx'd).  States that she experiences frequent UTI's.

## 2012-02-05 NOTE — Progress Notes (Signed)
Patient ID: [name], female   DOB: 06-30-1924, 78 y.o.   MRN: [id number]  ESTABLISHED PATIENT OFFICE VISIT  Chief Complaint: Surveillance of right renal mass.  History: [name] returns for follow-up. She was initially seen in June after referral from Dr. Annabell Howells for evaluation and possible ablation of a posterior right upper renal mass measuring approximately 1.6 - 1.7 cm in maximal diameter. Given that the lesion appeared to not have grown significantly over 6 months, it was elected to follow the lesion for another 6 month interval. Follow-up imaging was performed today.  The patient remains asymptomatic with respect to the renal lesion. She denies any hematuria or dysuria. She has no focal flank pain.  Review of Systems: No fever or chills. No shortness of breath or wheezing. No abdominal pain.  Exam: Vital signs: Blood pressure 112/75, pulse 99, respirations 15, temperature 98.3, oxygen saturation 93% on room air. Abdomen: Soft and nontender. No flank tenderness elicited.  Labs: BUN 28, Creatinine 0.9 and estimated GFR 59 mL/min.  Imaging: I personally reviewed the follow up CT performed today. The right renal lesion shows probable mild growth since a prior MRI in May with maximal diameter of approximately 1.8-1.9 cm. This represents approximately 2 mm of growth over 6 months. Lesion characteristics are otherwise stable. There is no evidence of metastatic disease in the abdomen.  Assessment and Plan: I reviewed imaging findings with [name] and her son. The right renal lesion shows evidence of growth over the last 6 months. Options for treatment were rediscussed, including percutaneous ablation and biopsy. The patient is concerned about malignancy and would like to proceed with percutaneous cryoablation. Biopsy can be performed at that time and is not necessary prior to ablation given high suspicion of malignancy based on lesion characteristics and growth. The  patient has done well with previous general anesthesia including more recent left knee arthroplasty in 2009 and right knee arthroplasty in 2011. We will begin the scheduling process. The patient requested treatment in January after the holidays.

## 2012-03-01 ENCOUNTER — Ambulatory Visit (INDEPENDENT_AMBULATORY_CARE_PROVIDER_SITE_OTHER): Payer: Medicare Other | Admitting: Emergency Medicine

## 2012-03-01 VITALS — BP 103/65 | HR 89 | Temp 97.8°F | Resp 18 | Ht 62.5 in | Wt 167.0 lb

## 2012-03-01 DIAGNOSIS — M25519 Pain in unspecified shoulder: Secondary | ICD-10-CM

## 2012-03-01 DIAGNOSIS — M069 Rheumatoid arthritis, unspecified: Secondary | ICD-10-CM

## 2012-03-01 MED ORDER — TRAMADOL HCL 50 MG PO TABS: 50.0000 mg | ORAL_TABLET | Freq: Three times a day (TID) | ORAL | Status: DC | PRN

## 2012-03-01 NOTE — Progress Notes (Signed)
[facility] [address] [place] [address] [phone]  Date:  03/01/2012   Name:  [name]   DOB:  February 26, 1925   MRN:  [id number]  PCP:  Estill Dooms, MD    Chief Complaint: pain and weakness in both arms, hands and shoulders   History of Present Illness:  [name] is a 78 y.o. very pleasant female patient who presents with the following:  3 weeks duration pain in hands and into shoulders up her arm.  Worse on right.  Was on voltaren and a week ago was put on mobic to take in addition.  Has had no improvement in condition overall and has not increased the mobic as she was instructed to do as she is not impressed it is giving her much meaningful relief.  Has pain in right shoulder that interferes with ABDuction of arm.  Unable to raise her arm parallel to floor.  No history of injury or overuse.  Long history of rheumatoid arthritis.  Has appt with regular medical doctor on January 7 and is scheduled for an ablation surgery of her kidney.  Patient Active Problem List  Diagnosis  . Pneumonia  . Diabetes mellitus  . HTN (hypertension)  . Polymyalgia rheumatica  . Depression  . Renal mass  . Anemia  . Hypoxia  . Volume overload  . Auto immune neutropenia  . Immune thrombocytopenia  . Degenerative arthritis of cervical spine  . Degenerative arthritis of hip  . Degenerative arthritis of knee  . Carpal tunnel syndrome of right wrist  . TIA (transient ischemic attack)    Past Medical History  Diagnosis Date  . Diabetes mellitus   . HTN (hypertension)   . Diabetes mellitus   . Polymyalgia rheumatica   . Arthritis   . Cataract   . Osteoporosis   . Cancer   . 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 Degenerative arthritis of cervical spine 02/09/2011  . Degenerative arthritis of hip 02/09/2011  . Degenerative arthritis of knee 02/09/2011  . Carpal tunnel syndrome of right wrist 02/09/2011  . TIA  (transient ischemic attack) 02/09/2011  . COPD (chronic obstructive pulmonary disease)   . CHF (congestive heart failure)   . Right renal mass     Past Surgical History  Procedure Date  . Joint replacement   . Back surgery     History  Substance Use Topics  . Smoking status: Former Smoker    Types: Cigarettes    Quit date: 01/11/1983  . Smokeless tobacco: Never Used  . Alcohol Use: No    Family History  Problem Relation Age of Onset  . Leukemia    . Cancer Mother   . Cancer Father   . Cancer Brother   . Cancer Daughter     Allergies  Allergen Reactions  . Codeine   . Morphine And Related Other (See Comments)    Pt not sure of reaction to morphine    Medication list has been reviewed and updated.  Current Outpatient Prescriptions on File Prior to Visit  Medication Sig Dispense Refill  . acyclovir (ZOVIRAX) 200 MG capsule Take 200 mg by mouth daily.       Marland Kitchen amitriptyline (ELAVIL) 50 MG tablet Take 0.5 tablets (25 mg total) by mouth at bedtime.  30 tablet  0  . aspirin EC 325 MG tablet Take 325 mg by mouth daily.        Marland Kitchen  enalapril (VASOTEC) 5 MG tablet Take 5 mg by mouth daily.      Marland Kitchen gabapentin (NEURONTIN) 300 MG capsule Take 1 capsule (300 mg total) by mouth 3 (three) times daily.  30 capsule  0  . insulin detemir (LEVEMIR) 100 UNIT/ML injection Inject 40 Units into the skin at bedtime.       . Multiple Vitamins-Minerals (MULTIVITAMINS THER. W/MINERALS) TABS Take 1 tablet by mouth daily.        . predniSONE (DELTASONE) 1 MG tablet Take 3 tablets (3 mg total) by mouth daily.  3 tablet  0  . albuterol (PROVENTIL) (5 MG/ML) 0.5% nebulizer solution Take 0.5 mLs (2.5 mg total) by nebulization every 6 (six) hours.  200 mL  0  . insulin aspart (NOVOLOG) 100 UNIT/ML injection Inject 0-15 Units into the skin at bedtime. Sugar level less than 150 do not take any Novolog, if sugar level is between 151-200 take 3 units, 201-250 take 5 units, 251-300 take 7 units 301-350 take 9  units, 351-400 take 11 units, sugar level over 400 take 15 units and notify docotor       . ipratropium (ATROVENT) 0.02 % nebulizer solution Take 2.5 mLs (0.5 mg total) by nebulization every 6 (six) hours.  75 mL  0  . metoprolol tartrate (LOPRESSOR) 12.5 mg TABS Take 0.5 tablets (12.5 mg total) by mouth 2 (two) times daily.  60 tablet  0  . potassium chloride (K-DUR) 10 MEQ tablet Take 2 tablets (20 mEq total) by mouth daily.  30 tablet  0  . predniSONE (STERAPRED UNI-PAK) 10 MG tablet Take 2 tablets daily for 5 days, then1 tablet daily for 5 days, then half a tablet daily for 5 days, then resume her maintenance dose of 3 mg daily long-term as previously for PMR  18 tablet  0  . solifenacin (VESICARE) 10 MG tablet Take 10 mg by mouth daily.       . [DISCONTINUED] furosemide (LASIX) 40 MG tablet Take 1 tablet (40 mg total) by mouth daily.  30 tablet  0    Review of Systems:  As per HPI, otherwise negative.    Physical Examination: Filed Vitals:   03/01/12 1832  BP: 103/65  Pulse: 89  Temp: 97.8 F (36.6 C)  Resp: 18   Filed Vitals:   03/01/12 1832  Height: 5' 2.5" (1.588 m)  Weight: 167 lb (75.751 kg)   Body mass index is 30.06 kg/(m^2). Ideal Body Weight: Weight in (lb) to have BMI = 25: 138.6   GEN: WDWN, NAD, Non-toxic, A & O x 3 HEENT: Atraumatic, Normocephalic. Neck supple. No masses, No LAD. Ears and Nose: No external deformity. CV: RRR, No M/G/R. No JVD. No thrill. No extra heart sounds. PULM: CTA B, no wheezes, crackles, rhonchi. No retractions. No resp. distress. No accessory muscle use. ABD: S, NT, ND, +BS. No rebound. No HSM. EXTR: No c/c/e  Limited ability to abduct her shoulders and exaggerated inability to demonstrate ROM not cooperating.Ulnar deviation fingers of both hands NEURO antalgic unsteady gait.    PSYCH: Normally interactive. Conversant. Not depressed or anxious appearing.  Calm demeanor.    Assessment and Plan: Rheumatoid arthritis Tramadol Follow  up with family MD  Roselee Culver, MD

## 2012-03-01 NOTE — Patient Instructions (Addendum)
Arthritis, Rheumatoid  You have rheumatoid arthritis. This is arthritis caused by problems with the immune system. Symptoms may include swelling, pain, tenderness and heat around the affected joints. The hands, feet, and knees are most often involved. The pain may move from joint to joint. Other symptoms such as fever, weight loss, tiredness, and anemia may also occur. There is no cure. Most people get along well with treatment. A few patients have severe joint damage and disability.  Only take over-the-counter or prescription medicines for pain or discomfort as directed by your caregiver. More powerful drugs such as cortisone and narcotics may be prescribed when symptoms are severe. Heat and other physical therapies are usually helpful. There are new medications that treat the underlying immune system problem. These are prescribed by specialists. You should be as active as possible, but extra rest is needed if the arthritis flares up. Hot painful joints sometimes need temporary splinting. To assure proper long-term treatment, see your doctor or a joint specialist or rheumatologist.  Document Released: 03/31/2004 Document Revised: 02/10/2011 Document Reviewed: 02/21/2005  [employer] Patient Information 2012 [employer]

## 2012-03-06 ENCOUNTER — Other Ambulatory Visit: Payer: Self-pay | Admitting: Radiology

## 2012-03-12 ENCOUNTER — Other Ambulatory Visit: Payer: Self-pay | Admitting: Radiology

## 2012-03-12 ENCOUNTER — Encounter (HOSPITAL_COMMUNITY): Payer: Self-pay | Admitting: Pharmacy Technician

## 2012-03-12 NOTE — Patient Instructions (Addendum)
[name]  03/12/2012    Report to [facility] 03-16-2012              At 0600 AM.  Call this number if you have problems the morning of surgery [phone]   Remember:take 1/2 dose levemir insulin bedtime 03-15-2012 (take total of 15 units)   Do not eat food or drink liquids :After Midnight.     Take these medicines the morning of surgery with A SIP OF WATER: tylenol es if needed, gabapentin, metorpolol tartrate, prednisone   Do not wear jewelry, make-up or nail polish.  Do not wear lotions, powders, or perfumes. You may wear deodorant.   Men may shave face and neck.  Do not bring valuables to the hospital.  Contacts, dentures or bridgework may not be worn into surgery.  Leave suitcase in the car. After surgery it may be brought to your room.  For patients admitted to the hospital, checkout time is 11:00 AM the day of discharge.   Patients discharged the day of surgery will not be allowed to drive home.  Name and phone number of your driver:  Special Instructions: N/A   Please read over the following fact sheets that you were given: MRSA Information. Blood fact sheet  Call Cain Sieve RN pre op nurse if needed [phone]    FAILURE TO FOLLOW THESE INSTRUCTIONS MAY RESULT IN THE CANCELLATION OF YOUR SURGERY. PATIENT SIGNATURE___________________________________________

## 2012-03-13 ENCOUNTER — Ambulatory Visit (HOSPITAL_COMMUNITY)
Admission: RE | Admit: 2012-03-13 | Discharge: 2012-03-13 | Disposition: A | Discharge status: Home / Self Care | Payer: Medicare Other | Source: Ambulatory Visit | Attending: Interventional Radiology | Admitting: Interventional Radiology

## 2012-03-13 ENCOUNTER — Encounter (HOSPITAL_COMMUNITY): Payer: Self-pay

## 2012-03-13 ENCOUNTER — Encounter (HOSPITAL_COMMUNITY)
Admission: RE | Admit: 2012-03-13 | Discharge: 2012-03-13 | Payer: Medicare Other | Source: Ambulatory Visit | Attending: Interventional Radiology | Admitting: Interventional Radiology

## 2012-03-13 DIAGNOSIS — Z01812 Encounter for preprocedural laboratory examination: Secondary | ICD-10-CM | POA: Insufficient documentation

## 2012-03-13 DIAGNOSIS — Z01818 Encounter for other preprocedural examination: Secondary | ICD-10-CM | POA: Insufficient documentation

## 2012-03-13 DIAGNOSIS — Z01810 Encounter for preprocedural cardiovascular examination: Secondary | ICD-10-CM | POA: Insufficient documentation

## 2012-03-13 HISTORY — DX: Other specified postprocedural states: Z98.890

## 2012-03-13 HISTORY — DX: Nausea with vomiting, unspecified: R11.2

## 2012-03-13 LAB — PROTIME-INR
INR: 1.05 (ref 0.00–1.49)
Prothrombin Time: 13.6 seconds (ref 11.6–15.2)

## 2012-03-13 LAB — CBC
HCT: 37.4 % (ref 36.0–46.0)
Hemoglobin: 12.4 g/dL (ref 12.0–15.0)
MCHC: 33.2 g/dL (ref 30.0–36.0)
RBC: 4.05 MIL/uL (ref 3.87–5.11)
WBC: 9.8 10*3/uL (ref 4.0–10.5)

## 2012-03-13 LAB — BASIC METABOLIC PANEL
BUN: 39 mg/dL — ABNORMAL HIGH (ref 6–23)
Chloride: 101 mEq/L (ref 96–112)
GFR calc Af Amer: 58 mL/min — ABNORMAL LOW (ref >90)
GFR calc non Af Amer: 50 mL/min — ABNORMAL LOW (ref >90)
Potassium: 4.6 mEq/L (ref 3.5–5.1)
Sodium: 138 mEq/L (ref 135–145)

## 2012-03-16 ENCOUNTER — Encounter (HOSPITAL_COMMUNITY): Payer: Self-pay | Admitting: Anesthesiology

## 2012-03-16 ENCOUNTER — Encounter (HOSPITAL_COMMUNITY)
Admission: RE | Disposition: A | Discharge status: Home / Self Care | Payer: Self-pay | Source: Ambulatory Visit | Attending: Interventional Radiology

## 2012-03-16 ENCOUNTER — Ambulatory Visit (HOSPITAL_COMMUNITY): Payer: Medicare Other | Admitting: Anesthesiology

## 2012-03-16 ENCOUNTER — Encounter (HOSPITAL_COMMUNITY): Payer: Self-pay

## 2012-03-16 ENCOUNTER — Ambulatory Visit (HOSPITAL_COMMUNITY)
Admission: RE | Admit: 2012-03-16 | Discharge: 2012-03-16 | Disposition: A | Discharge status: Home / Self Care | Payer: Medicare Other | Source: Ambulatory Visit | Attending: Interventional Radiology | Admitting: Interventional Radiology

## 2012-03-16 ENCOUNTER — Observation Stay (HOSPITAL_COMMUNITY)
Admission: RE | Admit: 2012-03-16 | Discharge: 2012-03-18 | Disposition: A | Discharge status: Home / Self Care | Payer: Medicare Other | Source: Ambulatory Visit | Attending: Interventional Radiology | Admitting: Interventional Radiology

## 2012-03-16 DIAGNOSIS — J4489 Other specified chronic obstructive pulmonary disease: Secondary | ICD-10-CM | POA: Insufficient documentation

## 2012-03-16 DIAGNOSIS — I509 Heart failure, unspecified: Secondary | ICD-10-CM | POA: Insufficient documentation

## 2012-03-16 DIAGNOSIS — Z794 Long term (current) use of insulin: Secondary | ICD-10-CM | POA: Insufficient documentation

## 2012-03-16 DIAGNOSIS — E1129 Type 2 diabetes mellitus with other diabetic kidney complication: Secondary | ICD-10-CM | POA: Diagnosis present

## 2012-03-16 DIAGNOSIS — Z79899 Other long term (current) drug therapy: Secondary | ICD-10-CM | POA: Insufficient documentation

## 2012-03-16 DIAGNOSIS — F3289 Other specified depressive episodes: Secondary | ICD-10-CM | POA: Insufficient documentation

## 2012-03-16 DIAGNOSIS — D3000 Benign neoplasm of unspecified kidney: Principal | ICD-10-CM | POA: Insufficient documentation

## 2012-03-16 DIAGNOSIS — F329 Major depressive disorder, single episode, unspecified: Secondary | ICD-10-CM | POA: Insufficient documentation

## 2012-03-16 DIAGNOSIS — J449 Chronic obstructive pulmonary disease, unspecified: Secondary | ICD-10-CM | POA: Insufficient documentation

## 2012-03-16 DIAGNOSIS — M353 Polymyalgia rheumatica: Secondary | ICD-10-CM | POA: Insufficient documentation

## 2012-03-16 DIAGNOSIS — I10 Essential (primary) hypertension: Secondary | ICD-10-CM | POA: Diagnosis present

## 2012-03-16 DIAGNOSIS — D539 Nutritional anemia, unspecified: Secondary | ICD-10-CM | POA: Diagnosis present

## 2012-03-16 DIAGNOSIS — E119 Type 2 diabetes mellitus without complications: Secondary | ICD-10-CM | POA: Insufficient documentation

## 2012-03-16 DIAGNOSIS — D649 Anemia, unspecified: Secondary | ICD-10-CM | POA: Insufficient documentation

## 2012-03-16 DIAGNOSIS — R112 Nausea with vomiting, unspecified: Secondary | ICD-10-CM | POA: Insufficient documentation

## 2012-03-16 HISTORY — PX: OTHER SURGICAL HISTORY: SHX169

## 2012-03-16 LAB — TYPE AND SCREEN
ABO/RH(D): A NEG
Antibody Screen: NEGATIVE

## 2012-03-16 LAB — GLUCOSE, CAPILLARY
Glucose-Capillary: 109 mg/dL — ABNORMAL HIGH (ref 70–99)
Glucose-Capillary: 154 mg/dL — ABNORMAL HIGH (ref 70–99)
Glucose-Capillary: 191 mg/dL — ABNORMAL HIGH (ref 70–99)
Glucose-Capillary: 238 mg/dL — ABNORMAL HIGH (ref 70–99)

## 2012-03-16 SURGERY — RADIO FREQUENCY ABLATION
Anesthesia: General | Laterality: Right | Wound class: Clean

## 2012-03-16 MED ORDER — INSULIN DETEMIR 100 UNIT/ML ~~LOC~~ SOLN
30.0000 [IU] | Freq: Every day | SUBCUTANEOUS | Status: DC
  Administered 2012-03-16 – 2012-03-17 (×2): 30 [IU] via SUBCUTANEOUS
  Filled 2012-03-16: qty 10

## 2012-03-16 MED ORDER — LACTATED RINGERS IV SOLN: INTRAVENOUS | Status: DC

## 2012-03-16 MED ORDER — SODIUM CHLORIDE 0.9 % IV SOLN
10.0000 mg | INTRAVENOUS | Status: DC | PRN
  Administered 2012-03-16: 40 ug via INTRAVENOUS

## 2012-03-16 MED ORDER — ENALAPRIL MALEATE 5 MG PO TABS
5.0000 mg | ORAL_TABLET | Freq: Every morning | ORAL | Status: DC
  Administered 2012-03-16 – 2012-03-18 (×3): 5 mg via ORAL
  Filled 2012-03-16 (×3): qty 1

## 2012-03-16 MED ORDER — IPRATROPIUM BROMIDE 0.02 % IN SOLN: 0.5000 mg | Freq: Four times a day (QID) | RESPIRATORY_TRACT | Status: DC | PRN

## 2012-03-16 MED ORDER — PROMETHAZINE HCL 25 MG/ML IJ SOLN: 6.2500 mg | INTRAMUSCULAR | Status: DC | PRN

## 2012-03-16 MED ORDER — SENNOSIDES-DOCUSATE SODIUM 8.6-50 MG PO TABS
1.0000 | ORAL_TABLET | Freq: Every day | ORAL | Status: DC | PRN
  Filled 2012-03-16: qty 1

## 2012-03-16 MED ORDER — METOPROLOL TARTRATE 12.5 MG HALF TABLET
12.5000 mg | ORAL_TABLET | Freq: Two times a day (BID) | ORAL | Status: DC
  Administered 2012-03-16 – 2012-03-18 (×4): 12.5 mg via ORAL
  Filled 2012-03-16 (×5): qty 1

## 2012-03-16 MED ORDER — EPHEDRINE SULFATE 50 MG/ML IJ SOLN
INTRAMUSCULAR | Status: DC | PRN
  Administered 2012-03-16: 5 mg via INTRAVENOUS
  Administered 2012-03-16 (×2): 10 mg via INTRAVENOUS
  Administered 2012-03-16: 5 mg via INTRAVENOUS
  Administered 2012-03-16: 10 mg via INTRAVENOUS

## 2012-03-16 MED ORDER — SODIUM CHLORIDE 0.9 % IV SOLN: 10.0000 mg | INTRAVENOUS | Status: DC | PRN

## 2012-03-16 MED ORDER — CEFAZOLIN SODIUM 1-5 GM-% IV SOLN
INTRAVENOUS | Status: DC | PRN
  Administered 2012-03-16: 1 g via INTRAVENOUS

## 2012-03-16 MED ORDER — FENTANYL CITRATE 0.05 MG/ML IJ SOLN: 25.0000 ug | INTRAMUSCULAR | Status: DC | PRN

## 2012-03-16 MED ORDER — ROCURONIUM BROMIDE 100 MG/10ML IV SOLN
INTRAVENOUS | Status: DC | PRN
  Administered 2012-03-16: 50 mg via INTRAVENOUS

## 2012-03-16 MED ORDER — MEPERIDINE HCL 50 MG/ML IJ SOLN: 6.2500 mg | INTRAMUSCULAR | Status: DC | PRN

## 2012-03-16 MED ORDER — PROPOFOL 10 MG/ML IV BOLUS
INTRAVENOUS | Status: DC | PRN
  Administered 2012-03-16: 100 mg via INTRAVENOUS

## 2012-03-16 MED ORDER — IPRATROPIUM BROMIDE 0.02 % IN SOLN
0.5000 mg | Freq: Four times a day (QID) | RESPIRATORY_TRACT | Status: DC
  Filled 2012-03-16: qty 2.5

## 2012-03-16 MED ORDER — FUROSEMIDE 20 MG PO TABS
20.0000 mg | ORAL_TABLET | Freq: Every day | ORAL | Status: DC
  Administered 2012-03-16 – 2012-03-18 (×2): 20 mg via ORAL
  Filled 2012-03-16 (×3): qty 1

## 2012-03-16 MED ORDER — LIDOCAINE HCL (CARDIAC) 20 MG/ML IV SOLN
INTRAVENOUS | Status: DC | PRN
  Administered 2012-03-16: 50 mg via INTRAVENOUS

## 2012-03-16 MED ORDER — GABAPENTIN 300 MG PO CAPS
300.0000 mg | ORAL_CAPSULE | Freq: Three times a day (TID) | ORAL | Status: DC
  Administered 2012-03-16 – 2012-03-18 (×6): 300 mg via ORAL
  Filled 2012-03-16 (×8): qty 1

## 2012-03-16 MED ORDER — TRAMADOL HCL 50 MG PO TABS
50.0000 mg | ORAL_TABLET | Freq: Three times a day (TID) | ORAL | Status: DC | PRN
  Administered 2012-03-17 – 2012-03-18 (×3): 50 mg via ORAL
  Filled 2012-03-16 (×4): qty 1

## 2012-03-16 MED ORDER — ONDANSETRON HCL 4 MG/2ML IJ SOLN
4.0000 mg | Freq: Four times a day (QID) | INTRAMUSCULAR | Status: DC | PRN
  Administered 2012-03-17: 4 mg via INTRAVENOUS
  Filled 2012-03-16: qty 2

## 2012-03-16 MED ORDER — CEFAZOLIN SODIUM 1-5 GM-% IV SOLN
INTRAVENOUS | Status: AC
  Filled 2012-03-16: qty 50

## 2012-03-16 MED ORDER — CEFAZOLIN SODIUM 1-5 GM-% IV SOLN: 1.0000 g | INTRAVENOUS | Status: DC

## 2012-03-16 MED ORDER — FENTANYL CITRATE 0.05 MG/ML IJ SOLN
INTRAMUSCULAR | Status: DC | PRN
  Administered 2012-03-16 (×3): 50 ug via INTRAVENOUS

## 2012-03-16 MED ORDER — PHENYLEPHRINE HCL 10 MG/ML IJ SOLN
INTRAMUSCULAR | Status: DC | PRN
  Administered 2012-03-16 (×3): 40 ug via INTRAVENOUS
  Administered 2012-03-16: 80 ug via INTRAVENOUS
  Administered 2012-03-16 (×2): 40 ug via INTRAVENOUS

## 2012-03-16 MED ORDER — LACTATED RINGERS IV SOLN
INTRAVENOUS | Status: DC | PRN
  Administered 2012-03-16 (×2): via INTRAVENOUS

## 2012-03-16 MED ORDER — DOCUSATE SODIUM 100 MG PO CAPS
100.0000 mg | ORAL_CAPSULE | Freq: Two times a day (BID) | ORAL | Status: DC
  Administered 2012-03-16 – 2012-03-18 (×4): 100 mg via ORAL
  Filled 2012-03-16 (×5): qty 1

## 2012-03-16 MED ORDER — AMITRIPTYLINE HCL 50 MG PO TABS
50.0000 mg | ORAL_TABLET | Freq: Every day | ORAL | Status: DC
  Administered 2012-03-16 – 2012-03-17 (×2): 50 mg via ORAL
  Filled 2012-03-16 (×3): qty 1

## 2012-03-16 MED ORDER — INSULIN ASPART 100 UNIT/ML ~~LOC~~ SOLN
0.0000 [IU] | Freq: Three times a day (TID) | SUBCUTANEOUS | Status: DC
  Administered 2012-03-16: 3 [IU] via SUBCUTANEOUS
  Administered 2012-03-17: 2 [IU] via SUBCUTANEOUS
  Administered 2012-03-17: 3 [IU] via SUBCUTANEOUS
  Administered 2012-03-18: 2 [IU] via SUBCUTANEOUS

## 2012-03-16 MED ORDER — PREDNISONE 1 MG PO TABS
3.0000 mg | ORAL_TABLET | Freq: Every morning | ORAL | Status: DC
  Administered 2012-03-16 – 2012-03-18 (×3): 3 mg via ORAL
  Filled 2012-03-16 (×3): qty 3

## 2012-03-16 MED ORDER — OXYBUTYNIN CHLORIDE 5 MG PO TABS
5.0000 mg | ORAL_TABLET | Freq: Three times a day (TID) | ORAL | Status: DC | PRN
  Filled 2012-03-16: qty 1

## 2012-03-16 MED ORDER — ACYCLOVIR 200 MG PO CAPS
200.0000 mg | ORAL_CAPSULE | Freq: Every day | ORAL | Status: DC
  Administered 2012-03-16 – 2012-03-18 (×3): 200 mg via ORAL
  Filled 2012-03-16 (×3): qty 1

## 2012-03-16 MED ORDER — KETOROLAC TROMETHAMINE 30 MG/ML IJ SOLN
30.0000 mg | Freq: Four times a day (QID) | INTRAMUSCULAR | Status: DC | PRN
  Administered 2012-03-16 – 2012-03-17 (×3): 30 mg via INTRAVENOUS
  Filled 2012-03-16 (×3): qty 1

## 2012-03-16 NOTE — Progress Notes (Signed)
Day of Surgery  Subjective: Rt renal mass bx and cryoablation performed today in IR with Dr Fredia Sorrow Pt tolerated well Stable No N/V Painful at site of cryo- Rt flank  Objective: Vital signs in last 24 hours: Temp:  [97.3 F (36.3 C)-97.4 F (36.3 C)] 97.4 F (36.3 C) (01/10 1518) Pulse Rate:  [68-80] 71  (01/10 1518) Resp:  [14-20] 18  (01/10 1518) BP: (107-153)/(57-71) 107/57 mmHg (01/10 1518) SpO2:  [94 %-100 %] 100 % (01/10 1518) Weight:  [177 lb 1.6 oz (80.332 kg)] 177 lb 1.6 oz (80.332 kg) (01/10 1518)    Intake/Output from previous day:   Intake/Output this shift: Total I/O In: 1320 [I.V.:1320] Out: 350 [Urine:350]  PE: afeb; vss Site of cryo is sl tender Clean and dry No hematoma; no bleeding No redness no swelling   Lab Results:  No results found for this basename: WBC:2,HGB:2,HCT:2,PLT:2 in the last 72 hours BMET No results found for this basename: NA:2,K:2,CL:2,CO2:2,GLUCOSE:2,BUN:2,CREATININE:2,CALCIUM:2 in the last 72 hours PT/INR No results found for this basename: LABPROT:2,INR:2 in the last 72 hours ABG No results found for this basename: PHART:2,PCO2:2,PO2:2,HCO3:2 in the last 72 hours  Studies/Results: Ct Guide Tissue Ablation  03/16/2012  *RADIOLOGY REPORT*  Clinical Data:  2 cm right renal mass.  The patient presents for biopsy and percutaneous cryoablation.  CT-GUIDED CORE BIOPSY OF RIGHT RENAL MASS. CT-GUIDED PERCUTANEOUS CRYOABLATION OF RIGHT RENAL MASS.  Anesthesia:  General  Medications:  1 gram IV Ancef. As antibiotic prophylaxis, Ancef was ordered pre-procedure and administered intravenously within one hour of incision.  Procedure:  The procedure, risks, benefits, and alternatives were explained to the patient.  Questions regarding the procedure were encouraged and answered.  The patient understands and consents to the procedure.  The patient was placed under general anesthesia.  Initial unenhanced CT was performed in a prone position to  localize the right renal mass.  The right flank region was prepped with Betadine in a sterile fashion, and a sterile drape was applied covering the operative field.  A sterile gown and sterile gloves were used for the procedure.  A 17 gauge trocar needle was advanced into the right renal mass. Core biopsy was performed with an 18 gauge automated core biopsy device.  A total of 2 core samples were submitted in formalin for pathologic analysis.  Under CT guidance, two separate 1.7 mm Endocare percutaneous cryoablation probes were advanced into the right renal mass.  Probe positioning was confirmed by CT prior to cryoablation.  Cryoablation was performed through the two probes simultaneously. Initial 10 minute cycle of cryoablation was performed.  This was followed by a 8 minute thaw cycle.  A second 8 minute cycle of cryoablation was then performed.  During ablation, periodic CT imaging was performed to monitor ice ball formation and morphology. After active thaw, the cryoablation probes were removed.  Post-procedural CT was performed.  Complications: None  Findings:  Imaging demonstrates an exophytic upper pole posterior right renal mass measuring approximately 2 cm in greatest diameter. Solid tissue was obtained from the lesion.  CT monitoring during cryoablation shows adequate ice ball formation encompassing the region of the tumor.  IMPRESSION:  CT guided percutaneous core biopsy and cryoablation of right renal mass.  The patient will be observed overnight.  Initial follow-up will be performed in approximately 4 weeks.   Original Report Authenticated By: Irish Lack, M.D.    Ct Biopsy  03/16/2012  *RADIOLOGY REPORT*  Clinical Data:  2 cm right renal mass.  The  patient presents for biopsy and percutaneous cryoablation.  CT-GUIDED CORE BIOPSY OF RIGHT RENAL MASS. CT-GUIDED PERCUTANEOUS CRYOABLATION OF RIGHT RENAL MASS.  Anesthesia:  General  Medications:  1 gram IV Ancef. As antibiotic prophylaxis, Ancef was  ordered pre-procedure and administered intravenously within one hour of incision.  Procedure:  The procedure, risks, benefits, and alternatives were explained to the patient.  Questions regarding the procedure were encouraged and answered.  The patient understands and consents to the procedure.  The patient was placed under general anesthesia.  Initial unenhanced CT was performed in a prone position to localize the right renal mass.  The right flank region was prepped with Betadine in a sterile fashion, and a sterile drape was applied covering the operative field.  A sterile gown and sterile gloves were used for the procedure.  A 17 gauge trocar needle was advanced into the right renal mass. Core biopsy was performed with an 18 gauge automated core biopsy device.  A total of 2 core samples were submitted in formalin for pathologic analysis.  Under CT guidance, two separate 1.7 mm Endocare percutaneous cryoablation probes were advanced into the right renal mass.  Probe positioning was confirmed by CT prior to cryoablation.  Cryoablation was performed through the two probes simultaneously. Initial 10 minute cycle of cryoablation was performed.  This was followed by a 8 minute thaw cycle.  A second 8 minute cycle of cryoablation was then performed.  During ablation, periodic CT imaging was performed to monitor ice ball formation and morphology. After active thaw, the cryoablation probes were removed.  Post-procedural CT was performed.  Complications: None  Findings:  Imaging demonstrates an exophytic upper pole posterior right renal mass measuring approximately 2 cm in greatest diameter. Solid tissue was obtained from the lesion.  CT monitoring during cryoablation shows adequate ice ball formation encompassing the region of the tumor.  IMPRESSION:  CT guided percutaneous core biopsy and cryoablation of right renal mass.  The patient will be observed overnight.  Initial follow-up will be performed in approximately 4  weeks.   Original Report Authenticated By: Irish Lack, M.D.     Anti-infectives: Anti-infectives     Start     Dose/Rate Route Frequency Ordered Stop   03/16/12 1800   acyclovir (ZOVIRAX) 200 MG capsule 200 mg        200 mg Oral Daily 03/16/12 1243     03/16/12 0922   ceFAZolin (ANCEF) 1-5 GM-% IVPB     Comments: BRISSON, MARK: cabinet override         03/16/12 0922 03/16/12 2129          Assessment/Plan: s/p Procedure(s) (LRB) with comments: RADIO FREQUENCY ABLATION (Right) - CRYO ABLATION  Rt renal mass cryoablation in IR today Pt has done well Overnight obs Mild pain at site; no bleeding; no hematoma Will follow  Plan for dc in am   LOS: 0 days    [name] A 03/16/2012

## 2012-03-16 NOTE — Procedures (Signed)
Procedure:  CT guided core biopsy and cryoablation of right renal tumor Anesthesia:  General Findings:  Right posterior renal mass localized.  Biopsy:  18 G core bx x 2 via 17 G needle. Cryoablation:  Two 17 G Endocare probes placed into tumor.  Cryoablation performed. Plan:  PACU recovery followed by overnight observation.

## 2012-03-16 NOTE — Anesthesia Postprocedure Evaluation (Signed)
  Anesthesia Post-op Note  Patient: [name]  Procedure(s) Performed: Procedure(s) (LRB): RADIO FREQUENCY ABLATION (Right)  Patient Location: PACU  Anesthesia Type: General  Level of Consciousness: awake and alert   Airway and Oxygen Therapy: Patient Spontanous Breathing  Post-op Pain: mild  Post-op Assessment: Post-op Vital signs reviewed, Patient's Cardiovascular Status Stable, Respiratory Function Stable, Patent Airway and No signs of Nausea or vomiting  Last Vitals:  Filed Vitals:   03/16/12 1230  BP: 153/68  Pulse: 71  Temp: 36.3 C  Resp:     Post-op Vital Signs: stable   Complications: No apparent anesthesia complications

## 2012-03-16 NOTE — H&P (Signed)
[name] is an 79 y.o. female.   Chief Complaint: Pleasant lady referred to Dr Fredia Sorrow for evaluation of Rt renal mass from Dr Annabell Howells Original CTA of chest performed 12/17/10 when pt was seen for SOB Coincidental finding of Rt renal mass MRI 01/12/11 and MRI 07/26/11 were stable with no metastatic disease Asymptomatic Another CT performed 02/01/12 shows slight growth in mass Consulted again with Dr Fredia Sorrow Options were discussed and pt now scheduled for Rt renal mass percutaneous biopsy and cryoablation HPI: DM; HTN; Neutropenia; thrombocytopenia; TIA; COPD; CHF; Macular degeneration; Osteoarthritis; Hx HSV; Hx temporal arteritis and polymyalgia rheumatica  Past Medical History  Diagnosis Date  . Diabetes mellitus   . HTN (hypertension)   . Diabetes mellitus   . Cataract   . Osteoporosis   . 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 Carpal tunnel syndrome of right wrist 02/09/2011  . TIA (transient ischemic attack) 02/09/2011  . Right renal mass   . COPD (chronic obstructive pulmonary disease)     no inhalers used   . Pneumonia dec 2012  . CHF (congestive heart failure)   . Congestive heart failure   . Arthritis     ra  . Degenerative arthritis of cervical spine 02/09/2011  . Degenerative arthritis of hip 02/09/2011  . Degenerative arthritis of knee 02/09/2011  . PONV (postoperative nausea and vomiting)     in past, none recently  . Macular degeneration     both eyes    Past Surgical History  Procedure Date  . Back surgery 6 yrs ago    lower  . Eye surgery yrs ago    both eyes cataract surgery  . Joint replacement 10 yrs ago    left hip, both knees replaced one 3 yrs ago, one 6 yrs ago  . Upper neck surgery 8 yrs ago  . Right carpal tunnel release 5 yrs go  . Right arm plate 5 yrs ago  . Both elbows surgery 10-12 yrs ago    Family History  Problem Relation Age of Onset  . Leukemia    . Cancer Mother   . Cancer Father   .  Cancer Brother   . Cancer Daughter    Social History:  reports that she quit smoking about 29 years ago. Her smoking use included Cigarettes. She has a 60 pack-year smoking history. She has never used smokeless tobacco. She reports that she does not drink alcohol or use illicit drugs.  Allergies:  Allergies  Allergen Reactions  . Codeine Nausea And Vomiting  . Morphine And Related Other (See Comments)    Pt not sure of reaction to morphine     (Not in a hospital admission)  Results for orders placed during the hospital encounter of 03/16/12 (from the past 48 hour(s))  ABO/RH     Status: Normal   Collection Time   03/16/12  6:30 AM      Component Value Range Comment   ABO/RH(D) A NEG     GLUCOSE, CAPILLARY     Status: Abnormal   Collection Time   03/16/12  6:35 AM      Component Value Range Comment   Glucose-Capillary 109 (*) 70 - 99 mg/dL    No results found.  Review of Systems  Constitutional: Negative for fever and chills.  Respiratory: Negative for cough and shortness of breath.   Cardiovascular: Negative for chest pain.  Gastrointestinal: Negative for nausea, vomiting and abdominal pain.  Musculoskeletal: Negative for  back pain.  Neurological: Positive for weakness. Negative for dizziness and headaches.  Psychiatric/Behavioral: The patient is nervous/anxious.     There were no vitals taken for this visit. Physical Exam  Constitutional: She is oriented to person, place, and time. She appears well-developed and well-nourished.  Eyes: EOM are normal.  Cardiovascular: Normal rate, regular rhythm and normal heart sounds.   No murmur heard. Respiratory: Effort normal and breath sounds normal. She has no wheezes.  GI: Soft. Bowel sounds are normal. There is no tenderness.  Musculoskeletal: Normal range of motion.       Uses cane   Neurological: She is alert and oriented to person, place, and time.  Skin: Skin is warm and dry.  Psychiatric: She has a normal mood and  affect. Her behavior is normal. Judgment and thought content normal.     Assessment/Plan Rt renal mass found coincidentally 12/2010 on CTA when being evaluated for SOB Mass followed with MRI for 1 yr at 6 mo intervals - stable and no mets: asymptomatic CT 01/2012 shows sl enlargement Now scheduled for Rt renal mass bx and cryoablation Pt and son aware of procedure benefits and risks and agreeable to proceed Consent signed and in chart Pt aware she will be admitted overnight for observation.  Brodie Scovell A 03/16/2012, 7:57 AM

## 2012-03-16 NOTE — Anesthesia Preprocedure Evaluation (Addendum)
Anesthesia Evaluation    History of Anesthesia Complications (+) PONV  Airway Mallampati: II TM Distance: >3 FB Neck ROM: Full    Dental No notable dental hx. (+) Edentulous Upper, Edentulous Lower, Lower Dentures and Upper Dentures   Pulmonary COPDformer smoker,  breath sounds clear to auscultation  Pulmonary exam normal       Cardiovascular hypertension, Pt. on medications +CHF Rhythm:Regular Rate:Normal     Neuro/Psych TIA   GI/Hepatic   Endo/Other  diabetes, Type 2, Oral Hypoglycemic Agents and Insulin Dependent  Renal/GU      Musculoskeletal   Abdominal   Peds  Hematology   Anesthesia Other Findings   Reproductive/Obstetrics                          Anesthesia Physical Anesthesia Plan  ASA: III  Anesthesia Plan: General   Post-op Pain Management:    Induction: Intravenous  Airway Management Planned: Oral ETT  Additional Equipment:   Intra-op Plan:   Post-operative Plan:   Informed Consent: I have reviewed the patients History and Physical, chart, labs and discussed the procedure including the risks, benefits and alternatives for the proposed anesthesia with the patient or authorized representative who has indicated his/her understanding and acceptance.   Dental advisory given  Plan Discussed with:   Anesthesia Plan Comments:         Anesthesia Quick Evaluation

## 2012-03-16 NOTE — H&P (Signed)
Agree 

## 2012-03-16 NOTE — Transfer of Care (Signed)
Immediate Anesthesia Transfer of Care Note  Patient: [name]  Procedure(s) Performed: Procedure(s) (LRB) with comments: RADIO FREQUENCY ABLATION (Right) - CRYO ABLATION  Patient Location: PACU  Anesthesia Type:General  Level of Consciousness: awake, alert  and oriented  Airway & Oxygen Therapy: Patient Spontanous Breathing and Patient connected to face mask oxygen  Post-op Assessment: Report given to PACU RN, Post -op Vital signs reviewed and stable and Patient moving all extremities  Post vital signs: Reviewed and stable  Complications: No apparent anesthesia complications

## 2012-03-17 LAB — CBC
MCH: 30 pg (ref 26.0–34.0)
MCV: 94.6 fL (ref 78.0–100.0)
Platelets: 205 10*3/uL (ref 150–400)
RDW: 15.3 % (ref 11.5–15.5)
WBC: 9.8 10*3/uL (ref 4.0–10.5)

## 2012-03-17 LAB — BASIC METABOLIC PANEL
CO2: 32 mEq/L (ref 19–32)
Calcium: 9.2 mg/dL (ref 8.4–10.5)
Creatinine, Ser: 1.02 mg/dL (ref 0.50–1.10)
GFR calc non Af Amer: 49 mL/min — ABNORMAL LOW (ref >90)
Glucose, Bld: 178 mg/dL — ABNORMAL HIGH (ref 70–99)

## 2012-03-17 LAB — GLUCOSE, CAPILLARY
Glucose-Capillary: 123 mg/dL — ABNORMAL HIGH (ref 70–99)
Glucose-Capillary: 148 mg/dL — ABNORMAL HIGH (ref 70–99)
Glucose-Capillary: 127 mg/dL — ABNORMAL HIGH (ref 70–99)

## 2012-03-17 MED ORDER — PROMETHAZINE HCL 25 MG/ML IJ SOLN
12.5000 mg | Freq: Four times a day (QID) | INTRAMUSCULAR | Status: DC | PRN
  Administered 2012-03-17: 12.5 mg via INTRAVENOUS
  Filled 2012-03-17: qty 1

## 2012-03-17 MED ORDER — TRAMADOL HCL 50 MG PO TABS: 50.0000 mg | ORAL_TABLET | Freq: Three times a day (TID) | ORAL | Status: DC | PRN

## 2012-03-17 MED ORDER — ACETAMINOPHEN 325 MG PO TABS
650.0000 mg | ORAL_TABLET | Freq: Four times a day (QID) | ORAL | Status: DC | PRN
  Administered 2012-03-17: 650 mg via ORAL
  Filled 2012-03-17: qty 2

## 2012-03-17 MED ORDER — PROCHLORPERAZINE MALEATE 5 MG PO TABS
5.0000 mg | ORAL_TABLET | Freq: Four times a day (QID) | ORAL | Status: DC | PRN
  Administered 2012-03-17: 5 mg via ORAL
  Filled 2012-03-17: qty 1

## 2012-03-17 MED ORDER — PROCHLORPERAZINE MALEATE 5 MG PO TABS: 5.0000 mg | ORAL_TABLET | Freq: Four times a day (QID) | ORAL | Status: DC | PRN

## 2012-03-17 NOTE — Discharge Summary (Signed)
Physician Discharge Summary  Patient ID: [name] MRN: [id number] DOB/AGE: June 06, 1924 79 y.o.  Admit date: 03/16/2012 Discharge date: 03/18/2012  Admission Diagnoses: right renal mass worrisome for renal cell carcinoma.  Discharge Diagnoses:  Principal Problem:  *Diabetes mellitus Active Problems:  Renal mass  HTN (hypertension)  Anemia  Polymyalgia rheumatica  Depression   Discharged Condition: stable.    Hospital Course: Patient admitted for observation post renal biopsy on the right followed by cryoablation under general anesthesia.  Patient tolerated procedure well with no immediate complications. Pathology from biopsy pending at time of discharge. She developed post operative nausea and vomiting later in the evening which poorly responded to zofran.  Phenergan was substituted which patient felt helped better than the zofran which is to be converted to po compazine.  Patient had no issues with voiding or hematuria. Patient denied any pain from surgical site and her site remained non-tender and stable throughout her admission. Her biggest complaint was that of nausea, vomiting and frontal headache. Patient had to be admitted for additional night to continue to assist with nausea control, and until she was able to ambulate and tolerate a diet.  Compazine was continued until nausea resolved.  She had a single episode of low oxygen saturations which improved with oxygen.  Patient denied any SOB or CP during this time. Felt that she was very fatigued from lengthy night before from nausea and vomiting.  Episode resolved and patient remained stable on room air.   Consults: none  Significant Diagnostic Studies: Results for [name], [name] (MRN [id number]) as of 03/17/2012 10:57  Ref. Range 03/17/2012 04:16  Sodium Latest Range: 135-145 mEq/L 140  Potassium Latest Range: 3.5-5.1 mEq/L 3.9  Chloride Latest Range: 96-112 mEq/L 99  CO2 Latest Range: 19-32 mEq/L 32  BUN Latest Range:  6-23 mg/dL 24 (H)  Creatinine Latest Range: 0.50-1.10 mg/dL 7.82  Calcium Latest Range: 8.4-10.5 mg/dL 9.2  GFR calc non Af Amer Latest Range: >90 mL/min 49 (L)  GFR calc Af Amer Latest Range: >90 mL/min 56 (L)  Glucose Latest Range: 70-99 mg/dL 956 (H)  WBC Latest Range: 4.6-10.2 K/uL 9.8  RBC Latest Range: 4.04-5.48 M/uL 3.13 (L)  Hemoglobin Latest Range: 12.0-15.0 g/dL 9.4 (L)  HCT Latest Range: 36.0-46.0 % 29.6 (L)  MCV Latest Range: 80-97 fL 94.6  MCH Latest Range: 27-31.2 pg 30.0  MCHC Latest Range: 31.8-35.4 g/dL 21.3  RDW Latest Range: 11.5-15.5 % 15.3  Platelets Latest Range: 150-400 K/uL 205   Treatments:  Clinical Data: 2 cm right renal mass. The patient presents for  biopsy and percutaneous cryoablation.  CT-GUIDED CORE BIOPSY OF RIGHT RENAL MASS.  CT-GUIDED PERCUTANEOUS CRYOABLATION OF RIGHT RENAL MASS.  Anesthesia: General  Medications: 1 gram IV Ancef. As antibiotic prophylaxis, Ancef  was ordered pre-procedure and administered intravenously within one  hour of incision.  Procedure: The procedure, risks, benefits, and alternatives were  explained to the patient. Questions regarding the procedure were  encouraged and answered. The patient understands and consents to  the procedure.  The patient was placed under general anesthesia. Initial  unenhanced CT was performed in a prone position to localize the  right renal mass.  The right flank region was prepped with Betadine in a sterile  fashion, and a sterile drape was applied covering the operative  field. A sterile gown and sterile gloves were used for the  procedure.  A 17 gauge trocar needle was advanced into the right renal mass.  Core biopsy was performed  with an 18 gauge automated core biopsy  device. A total of 2 core samples were submitted in formalin for  pathologic analysis.  Under CT guidance, two separate 1.7 mm [employer] percutaneous  cryoablation probes were advanced into the right renal mass. Probe    positioning was confirmed by CT prior to cryoablation.  Cryoablation was performed through the two probes simultaneously.  Initial 10 minute cycle of cryoablation was performed. This was  followed by a 8 minute thaw cycle. A second 8 minute cycle of  cryoablation was then performed. During ablation, periodic CT  imaging was performed to monitor ice ball formation and morphology.  After active thaw, the cryoablation probes were removed.  Post-procedural CT was performed.  Complications: None  Findings: Imaging demonstrates an exophytic upper pole posterior  right renal mass measuring approximately 2 cm in greatest diameter.  Solid tissue was obtained from the lesion. CT monitoring during  cryoablation shows adequate ice ball formation encompassing the  region of the tumor.  IMPRESSION:  CT guided percutaneous core biopsy and cryoablation of right renal  mass. The patient will be observed overnight. Initial follow-up  will be performed in approximately 4 weeks.  Original Report Authenticated By: Irish Lack, M.D.   Additional labs performed on 03/18/12 :  Results for [name], [name] (MRN [id number]) as of 03/18/2012 09:47  Ref. Range 03/18/2012 07:07  Sodium Latest Range: 135-145 mEq/L 139  Potassium Latest Range: 3.5-5.1 mEq/L 4.1  Chloride Latest Range: 96-112 mEq/L 97  CO2 Latest Range: 19-32 mEq/L 37 (H)  BUN Latest Range: 6-23 mg/dL 27 (H)  Creatinine Latest Range: 0.50-1.10 mg/dL 7.82  Calcium Latest Range: 8.4-10.5 mg/dL 9.5  GFR calc non Af Amer Latest Range: >90 mL/min 47 (L)  GFR calc Af Amer Latest Range: >90 mL/min 54 (L)  Glucose Latest Range: 70-99 mg/dL 956 (H)  WBC Latest Range: 4.6-10.2 K/uL 12.9 (H)  RBC Latest Range: 4.04-5.48 M/uL 3.08 (L)  Hemoglobin Latest Range: 12.0-15.0 g/dL 9.5 (L)  HCT Latest Range: 36.0-46.0 % 29.9 (L)  MCV Latest Range: 80-97 fL 97.1  MCH Latest Range: 27-31.2 pg 30.8  MCHC Latest Range: 31.8-35.4 g/dL 21.3  RDW Latest Range:  11.5-15.5 % 15.7 (H)  Platelets Latest Range: 150-400 K/uL 228     Discharge Exam: Blood pressure 110/70, pulse 82, temperature 97.4 F (36.3 C), temperature source Axillary, resp. rate 16, height 5\' 2"  (1.575 m), weight 177 lb 1.6 oz (80.332 kg), SpO2 96.00%.  Patient in bed, alert and oriented.  States has frontal headache and some left sided neck pain that was present prior to admission. Denies pain at surgical site, or increased shortness of breath.   Surgical site clean and dry. No evidence of bleeding or hematoma. No tenderness to site with palpation. Abdomen : soft, non-distended. Positive bowel sounds throughout abdomen. Respiratory : CTA bilaterally. CV: RRR. Left neck tender to palpation along musculature with slightly decreased ROM which patient states has been chronic. Bruit heard along left carotid.   To use heat as needed to assist with her neck pain. Phenergan ordered IV to get N/V under control and then convert to po compazine of which the patient will go home with Rx for same. If patient's nausea and vomiting continues despite these efforts - may need additional night of admission to continue to observe.   Labs reviewed which revealed a slight drop in hemoglobin. Patient without evidence of bleeding upon exam and is asymptomatic in regards to respiratory function. Patient has a history  of anemia - will hold aspirin for 48 hours post discharge.    Discharge exam from 03/18/12 : Patient up in bed finishing breakfast.  She denies any further nausea. Only complaint is that of headache which resolves with tylenol. She has urinated without difficulty and has had a normal BM.  CV: RRR,  Lungs with mild basilar rales on the right o/w CTA.  Surgical site clean and dry with no pain with palpation. No evidence of hematoma or bleeding at this site. Labs repeated and reviewed - expected leukocytosis from procedure.  H/H remains stable.    Disposition: 01-Home or Self Care       Medication List     As of 03/18/2012  9:50 AM    TAKE these medications         acetaminophen 500 MG tablet   Commonly known as: TYLENOL   Take 1,000 mg by mouth every 6 (six) hours as needed.      acyclovir 200 MG capsule   Commonly known as: ZOVIRAX   Take 200 mg by mouth daily.      amitriptyline 50 MG tablet   Commonly known as: ELAVIL   Take 50 mg by mouth at bedtime.      aspirin EC 325 MG tablet   Take 325 mg by mouth daily.      enalapril 5 MG tablet   Commonly known as: VASOTEC   Take 5 mg by mouth every morning.      furosemide 40 MG tablet   Commonly known as: LASIX   Take 20 mg by mouth daily.      gabapentin 300 MG capsule   Commonly known as: NEURONTIN   Take 1 capsule (300 mg total) by mouth 3 (three) times daily.      insulin aspart 100 UNIT/ML injection   Commonly known as: novoLOG   Inject 0-15 Units into the skin as needed. Sugar level less than 150 do not take any Novolog, if sugar level is between 151-200 take 3 units, 201-250 take 5 units, 251-300 take 7 units 301-350 take 9 units, 351-400 take 11 units, sugar level over 400 take 15 units and notify docotor      insulin detemir 100 UNIT/ML injection   Commonly known as: LEVEMIR   Inject 30 Units into the skin at bedtime.      ipratropium 0.02 % nebulizer solution   Commonly known as: ATROVENT   Take 2.5 mLs (0.5 mg total) by nebulization every 6 (six) hours.      metoprolol tartrate 12.5 mg Tabs   Commonly known as: LOPRESSOR   Take 0.5 tablets (12.5 mg total) by mouth 2 (two) times daily.      multivitamins ther. w/minerals Tabs   Take 1 tablet by mouth daily.      naproxen sodium 220 MG tablet   Commonly known as: ANAPROX   Take 220 mg by mouth 2 (two) times daily as needed. For pain      oxybutynin 5 MG tablet   Commonly known as: DITROPAN   Take 5 mg by mouth 3 (three) times daily as needed. For muscle spasms      predniSONE 1 MG tablet   Commonly known as: DELTASONE   Take 3 mg  by mouth every morning.      prochlorperazine 5 MG tablet   Commonly known as: COMPAZINE   Take 1 tablet (5 mg total) by mouth every 6 (six) hours as needed for nausea.  traMADol 50 MG tablet   Commonly known as: ULTRAM   Take 50 mg by mouth every 8 (eight) hours as needed. For pain      traMADol 50 MG tablet   Commonly known as: ULTRAM   Take 1 tablet (50 mg total) by mouth every 8 (eight) hours as needed for pain.         Follow-up Information    Call Reola Calkins, MD. (call Tammy to schedule follow up appointment with Dr. Fredia Sorrow)    Contact information:   [facility] [phone]          Signed: Michael Litter D 03/18/2012, 9:50 AM

## 2012-03-17 NOTE — Plan of Care (Signed)
Problem: Phase I Progression Outcomes Goal: Voiding-avoid urinary catheter unless indicated Outcome: Completed/Met Date Met:  03/17/12 Foley d/c'ed last night per order. Pt voiding adequately.

## 2012-03-17 NOTE — Progress Notes (Addendum)
Patient's son is now here.  Patient still with nausea and vomiting despite IV phenergan - although better than with zofran. Will attempt to convert to po compazine as this is the Rx patient will be provided with upon discharge.  Son was made aware that if nausea and vomiting cannot be controlled today - she will need another day stay to get under control prior to discharge home with him. He verbalized his understanding of this plan and is in agreement as well as the patient.   Addendum :  Patient's nausea has improved with compazine - however has not tolerated a diet and son concerned with recurrence of nausea and vomiting at home at this time. Will admit overnight to make sure N/V is controlled and that patient is able to tolerate a diet. Will recheck CBC in am to ensure Hgb stable.

## 2012-03-18 LAB — BASIC METABOLIC PANEL
Chloride: 97 mEq/L (ref 96–112)
Creatinine, Ser: 1.06 mg/dL (ref 0.50–1.10)
GFR calc Af Amer: 54 mL/min — ABNORMAL LOW (ref >90)
Sodium: 139 mEq/L (ref 135–145)

## 2012-03-18 LAB — CBC
MCV: 97.1 fL (ref 78.0–100.0)
Platelets: 228 10*3/uL (ref 150–400)
RDW: 15.7 % — ABNORMAL HIGH (ref 11.5–15.5)
WBC: 12.9 10*3/uL — ABNORMAL HIGH (ref 4.0–10.5)

## 2012-03-18 MED ORDER — IPRATROPIUM BROMIDE 0.02 % IN SOLN: 0.5000 mg | Freq: Four times a day (QID) | RESPIRATORY_TRACT | Status: DC | PRN

## 2012-03-18 NOTE — Plan of Care (Signed)
Problem: Discharge Progression Outcomes Goal: Discharge plan in place and appropriate Outcome: Adequate for Discharge dISCHARGED TO SON. pT LIVES WITH HIM. dISCHARGE INSTRUCTIONS EXPLAINED TO PT AND HER SON. tWO PRESCRIPTIONS GIVEN TO PT'S SON.

## 2012-03-18 NOTE — Progress Notes (Signed)
pT APPETITE GOOD. Iv DC'D. dISCHARGE INSTRUCTIONS EXPLAINED TO PT AND SON. pRESCRIPTIONS GIVEN TO PT. dISCHARGED VIA WHEELCHAIR

## 2012-03-19 ENCOUNTER — Other Ambulatory Visit: Payer: Self-pay | Admitting: Physician Assistant

## 2012-03-19 DIAGNOSIS — N2889 Other specified disorders of kidney and ureter: Secondary | ICD-10-CM

## 2012-03-19 NOTE — Discharge Summary (Signed)
Agree 

## 2012-03-20 ENCOUNTER — Encounter (HOSPITAL_COMMUNITY): Payer: Self-pay | Admitting: Emergency Medicine

## 2012-03-20 ENCOUNTER — Emergency Department (HOSPITAL_COMMUNITY): Payer: Medicare Other

## 2012-03-20 ENCOUNTER — Inpatient Hospital Stay (HOSPITAL_COMMUNITY)
Admission: EM | Admit: 2012-03-20 | Discharge: 2012-03-23 | DRG: 919 | Disposition: A | Discharge status: Skilled Nursing Facility | Payer: Medicare Other | Attending: Internal Medicine | Admitting: Internal Medicine

## 2012-03-20 DIAGNOSIS — D539 Nutritional anemia, unspecified: Secondary | ICD-10-CM | POA: Diagnosis present

## 2012-03-20 DIAGNOSIS — M179 Osteoarthritis of knee, unspecified: Secondary | ICD-10-CM

## 2012-03-20 DIAGNOSIS — M47812 Spondylosis without myelopathy or radiculopathy, cervical region: Secondary | ICD-10-CM

## 2012-03-20 DIAGNOSIS — N390 Urinary tract infection, site not specified: Secondary | ICD-10-CM | POA: Diagnosis present

## 2012-03-20 DIAGNOSIS — E8770 Fluid overload, unspecified: Secondary | ICD-10-CM

## 2012-03-20 DIAGNOSIS — M169 Osteoarthritis of hip, unspecified: Secondary | ICD-10-CM

## 2012-03-20 DIAGNOSIS — E119 Type 2 diabetes mellitus without complications: Secondary | ICD-10-CM

## 2012-03-20 DIAGNOSIS — M25519 Pain in unspecified shoulder: Secondary | ICD-10-CM | POA: Diagnosis present

## 2012-03-20 DIAGNOSIS — I10 Essential (primary) hypertension: Secondary | ICD-10-CM

## 2012-03-20 DIAGNOSIS — D649 Anemia, unspecified: Secondary | ICD-10-CM

## 2012-03-20 DIAGNOSIS — Y849 Medical procedure, unspecified as the cause of abnormal reaction of the patient, or of later complication, without mention of misadventure at the time of the procedure: Secondary | ICD-10-CM | POA: Diagnosis present

## 2012-03-20 DIAGNOSIS — E1129 Type 2 diabetes mellitus with other diabetic kidney complication: Secondary | ICD-10-CM | POA: Diagnosis present

## 2012-03-20 DIAGNOSIS — M1710 Unilateral primary osteoarthritis, unspecified knee: Secondary | ICD-10-CM

## 2012-03-20 DIAGNOSIS — J449 Chronic obstructive pulmonary disease, unspecified: Secondary | ICD-10-CM | POA: Diagnosis present

## 2012-03-20 DIAGNOSIS — F329 Major depressive disorder, single episode, unspecified: Secondary | ICD-10-CM

## 2012-03-20 DIAGNOSIS — R0902 Hypoxemia: Secondary | ICD-10-CM

## 2012-03-20 DIAGNOSIS — F32A Depression, unspecified: Secondary | ICD-10-CM

## 2012-03-20 DIAGNOSIS — G459 Transient cerebral ischemic attack, unspecified: Secondary | ICD-10-CM

## 2012-03-20 DIAGNOSIS — G5601 Carpal tunnel syndrome, right upper limb: Secondary | ICD-10-CM

## 2012-03-20 DIAGNOSIS — D3000 Benign neoplasm of unspecified kidney: Secondary | ICD-10-CM | POA: Diagnosis present

## 2012-03-20 DIAGNOSIS — A498 Other bacterial infections of unspecified site: Secondary | ICD-10-CM | POA: Diagnosis present

## 2012-03-20 DIAGNOSIS — M353 Polymyalgia rheumatica: Secondary | ICD-10-CM

## 2012-03-20 DIAGNOSIS — I509 Heart failure, unspecified: Secondary | ICD-10-CM

## 2012-03-20 DIAGNOSIS — J4489 Other specified chronic obstructive pulmonary disease: Secondary | ICD-10-CM | POA: Diagnosis present

## 2012-03-20 DIAGNOSIS — R58 Hemorrhage, not elsewhere classified: Secondary | ICD-10-CM | POA: Diagnosis present

## 2012-03-20 DIAGNOSIS — R410 Disorientation, unspecified: Secondary | ICD-10-CM | POA: Diagnosis present

## 2012-03-20 DIAGNOSIS — Z87891 Personal history of nicotine dependence: Secondary | ICD-10-CM

## 2012-03-20 DIAGNOSIS — D708 Other neutropenia: Secondary | ICD-10-CM

## 2012-03-20 DIAGNOSIS — IMO0002 Reserved for concepts with insufficient information to code with codable children: Principal | ICD-10-CM | POA: Diagnosis present

## 2012-03-20 DIAGNOSIS — R404 Transient alteration of awareness: Secondary | ICD-10-CM | POA: Diagnosis present

## 2012-03-20 DIAGNOSIS — D693 Immune thrombocytopenic purpura: Secondary | ICD-10-CM

## 2012-03-20 DIAGNOSIS — Z79899 Other long term (current) drug therapy: Secondary | ICD-10-CM

## 2012-03-20 DIAGNOSIS — N2889 Other specified disorders of kidney and ureter: Secondary | ICD-10-CM

## 2012-03-20 DIAGNOSIS — Z7982 Long term (current) use of aspirin: Secondary | ICD-10-CM

## 2012-03-20 DIAGNOSIS — J189 Pneumonia, unspecified organism: Secondary | ICD-10-CM

## 2012-03-20 DIAGNOSIS — Z794 Long term (current) use of insulin: Secondary | ICD-10-CM

## 2012-03-20 DIAGNOSIS — M810 Age-related osteoporosis without current pathological fracture: Secondary | ICD-10-CM | POA: Diagnosis present

## 2012-03-20 LAB — POCT I-STAT TROPONIN I: Troponin i, poc: 0.01 ng/mL (ref 0.00–0.08)

## 2012-03-20 LAB — URINALYSIS, ROUTINE W REFLEX MICROSCOPIC
Leukocytes, UA: NEGATIVE
Nitrite: NEGATIVE
Specific Gravity, Urine: 1.018 (ref 1.005–1.030)
Urobilinogen, UA: 1 mg/dL (ref 0.0–1.0)
pH: 7.5 (ref 5.0–8.0)

## 2012-03-20 LAB — BASIC METABOLIC PANEL
BUN: 23 mg/dL (ref 6–23)
Calcium: 9.3 mg/dL (ref 8.4–10.5)
GFR calc non Af Amer: 60 mL/min — ABNORMAL LOW (ref >90)
Glucose, Bld: 110 mg/dL — ABNORMAL HIGH (ref 70–99)
Sodium: 134 mEq/L — ABNORMAL LOW (ref 135–145)

## 2012-03-20 LAB — GLUCOSE, CAPILLARY
Glucose-Capillary: 83 mg/dL (ref 70–99)
Glucose-Capillary: 135 mg/dL — ABNORMAL HIGH (ref 70–99)

## 2012-03-20 LAB — HEMOGLOBIN AND HEMATOCRIT, BLOOD: HCT: 25.1 % — ABNORMAL LOW (ref 36.0–46.0)

## 2012-03-20 LAB — URINE MICROSCOPIC-ADD ON

## 2012-03-20 LAB — LIPASE, BLOOD: Lipase: 10 U/L — ABNORMAL LOW (ref 11–59)

## 2012-03-20 LAB — CBC
HCT: 28.6 % — ABNORMAL LOW (ref 36.0–46.0)
Hemoglobin: 9.3 g/dL — ABNORMAL LOW (ref 12.0–15.0)
MCH: 31 pg (ref 26.0–34.0)
MCHC: 32.5 g/dL (ref 30.0–36.0)

## 2012-03-20 LAB — TYPE AND SCREEN
ABO/RH(D): A NEG
Antibody Screen: NEGATIVE

## 2012-03-20 LAB — MRSA PCR SCREENING: MRSA by PCR: NEGATIVE

## 2012-03-20 MED ORDER — INSULIN DETEMIR 100 UNIT/ML ~~LOC~~ SOLN
30.0000 [IU] | Freq: Every day | SUBCUTANEOUS | Status: DC
  Administered 2012-03-20 – 2012-03-22 (×3): 30 [IU] via SUBCUTANEOUS
  Filled 2012-03-20 (×2): qty 10

## 2012-03-20 MED ORDER — TRAMADOL HCL 50 MG PO TABS
50.0000 mg | ORAL_TABLET | Freq: Three times a day (TID) | ORAL | Status: DC | PRN
  Administered 2012-03-20 – 2012-03-23 (×5): 50 mg via ORAL
  Filled 2012-03-20 (×6): qty 1

## 2012-03-20 MED ORDER — PREDNISONE 1 MG PO TABS
3.0000 mg | ORAL_TABLET | Freq: Every morning | ORAL | Status: DC
  Administered 2012-03-21 – 2012-03-23 (×3): 3 mg via ORAL
  Filled 2012-03-20 (×3): qty 3

## 2012-03-20 MED ORDER — PIPERACILLIN-TAZOBACTAM 3.375 G IVPB
3.3750 g | Freq: Three times a day (TID) | INTRAVENOUS | Status: DC
  Administered 2012-03-20 – 2012-03-23 (×8): 3.375 g via INTRAVENOUS
  Filled 2012-03-20 (×10): qty 50

## 2012-03-20 MED ORDER — GUAIFENESIN-DM 100-10 MG/5ML PO SYRP: 5.0000 mL | ORAL_SOLUTION | ORAL | Status: DC | PRN

## 2012-03-20 MED ORDER — SODIUM CHLORIDE 0.9 % IV BOLUS (SEPSIS)
500.0000 mL | Freq: Once | INTRAVENOUS | Status: AC
  Administered 2012-03-20: 500 mL via INTRAVENOUS

## 2012-03-20 MED ORDER — LEVALBUTEROL HCL 0.63 MG/3ML IN NEBU
0.6300 mg | INHALATION_SOLUTION | RESPIRATORY_TRACT | Status: DC | PRN
  Filled 2012-03-20: qty 3

## 2012-03-20 MED ORDER — OXYBUTYNIN CHLORIDE 5 MG PO TABS
5.0000 mg | ORAL_TABLET | Freq: Three times a day (TID) | ORAL | Status: DC | PRN
  Filled 2012-03-20: qty 1

## 2012-03-20 MED ORDER — SODIUM CHLORIDE 0.9 % IJ SOLN: 3.0000 mL | Freq: Two times a day (BID) | INTRAMUSCULAR | Status: DC

## 2012-03-20 MED ORDER — GABAPENTIN 300 MG PO CAPS
300.0000 mg | ORAL_CAPSULE | Freq: Three times a day (TID) | ORAL | Status: DC
  Administered 2012-03-21 – 2012-03-23 (×7): 300 mg via ORAL
  Filled 2012-03-20 (×9): qty 1

## 2012-03-20 MED ORDER — PIPERACILLIN-TAZOBACTAM 3.375 G IVPB
3.3750 g | Freq: Once | INTRAVENOUS | Status: AC
  Administered 2012-03-20: 3.375 g via INTRAVENOUS
  Filled 2012-03-20: qty 50

## 2012-03-20 MED ORDER — ONDANSETRON HCL 4 MG/2ML IJ SOLN: 4.0000 mg | Freq: Four times a day (QID) | INTRAMUSCULAR | Status: DC | PRN

## 2012-03-20 MED ORDER — IPRATROPIUM BROMIDE 0.02 % IN SOLN
0.5000 mg | Freq: Four times a day (QID) | RESPIRATORY_TRACT | Status: DC
  Administered 2012-03-20 – 2012-03-22 (×9): 0.5 mg via RESPIRATORY_TRACT
  Filled 2012-03-20 (×11): qty 2.5

## 2012-03-20 MED ORDER — HYDROMORPHONE HCL PF 1 MG/ML IJ SOLN
0.5000 mg | INTRAMUSCULAR | Status: DC | PRN
  Administered 2012-03-20: 1 mg via INTRAVENOUS
  Filled 2012-03-20: qty 1

## 2012-03-20 MED ORDER — METOPROLOL TARTRATE 12.5 MG HALF TABLET: 12.5000 mg | ORAL_TABLET | Freq: Two times a day (BID) | ORAL | Status: DC

## 2012-03-20 MED ORDER — FUROSEMIDE 20 MG PO TABS
20.0000 mg | ORAL_TABLET | Freq: Every day | ORAL | Status: DC
  Administered 2012-03-20 – 2012-03-22 (×3): 20 mg via ORAL
  Filled 2012-03-20 (×3): qty 1

## 2012-03-20 MED ORDER — INSULIN ASPART 100 UNIT/ML ~~LOC~~ SOLN
0.0000 [IU] | Freq: Three times a day (TID) | SUBCUTANEOUS | Status: DC
  Administered 2012-03-22: 2 [IU] via SUBCUTANEOUS
  Administered 2012-03-23: 1 [IU] via SUBCUTANEOUS

## 2012-03-20 MED ORDER — LEVALBUTEROL HCL 0.63 MG/3ML IN NEBU
0.6300 mg | INHALATION_SOLUTION | Freq: Four times a day (QID) | RESPIRATORY_TRACT | Status: DC
  Administered 2012-03-20 – 2012-03-22 (×9): 0.63 mg via RESPIRATORY_TRACT
  Filled 2012-03-20 (×16): qty 3

## 2012-03-20 MED ORDER — VANCOMYCIN HCL 1000 MG IV SOLR
750.0000 mg | Freq: Two times a day (BID) | INTRAVENOUS | Status: DC
  Administered 2012-03-20 – 2012-03-22 (×4): 750 mg via INTRAVENOUS
  Filled 2012-03-20 (×5): qty 750

## 2012-03-20 MED ORDER — SODIUM CHLORIDE 0.9 % IV SOLN
INTRAVENOUS | Status: DC
  Administered 2012-03-20: 18:00:00 via INTRAVENOUS

## 2012-03-20 MED ORDER — AMITRIPTYLINE HCL 50 MG PO TABS
50.0000 mg | ORAL_TABLET | Freq: Every day | ORAL | Status: DC
  Administered 2012-03-20 – 2012-03-22 (×3): 50 mg via ORAL
  Filled 2012-03-20 (×4): qty 1

## 2012-03-20 MED ORDER — METOPROLOL TARTRATE 12.5 MG HALF TABLET
12.5000 mg | ORAL_TABLET | Freq: Every day | ORAL | Status: DC
  Administered 2012-03-20 – 2012-03-23 (×4): 12.5 mg via ORAL
  Filled 2012-03-20 (×4): qty 1

## 2012-03-20 MED ORDER — VANCOMYCIN HCL IN DEXTROSE 1-5 GM/200ML-% IV SOLN
1000.0000 mg | Freq: Once | INTRAVENOUS | Status: DC
  Filled 2012-03-20: qty 200

## 2012-03-20 MED ORDER — ONDANSETRON HCL 4 MG PO TABS: 4.0000 mg | ORAL_TABLET | Freq: Four times a day (QID) | ORAL | Status: DC | PRN

## 2012-03-20 NOTE — ED Notes (Signed)
Placed patient on bedpan. Was not successful. RN Victorino Dike notified.

## 2012-03-20 NOTE — H&P (Signed)
[facility] History and Physical  [name] ZOX:[id number] DOB: 1924-03-20 DOA: 03/20/2012  Referring physician: Dr Freida Busman PCP: [name], MD  Specialists: IR- Dr Fredia Sorrow  Chief Complaint:   HPI: [name] is a 79 y.o. female with a history diabetes mellitus, hypertension, COPD, CHF, PMR, and right renal mass status post renal biopsy followed by prior ablation on 1/10 complicated by vomiting postprocedure and admitted for observation stay-discharged on 1/13 who presents with above complaints. She states that today she began having increased pain in her right flank area, also  in her shoulders bilaterally, and she felt very weak so she came to the ED. Her son states that she had some confusion. She admits to some shortness of breath but denies cough, fevers, dysuria, melena and no hematochezia. She was seen in the ED and a CT scan of her abdomen and pelvis that showed postprocedure changes in the posterior interpolar right kidney with associated small to moderate retroperitoneal hemorrhage. No evidence of small bowel obstruction. A chest x-ray showed a focal opacity posteriorly at the bases on the lateral view, cannot exclude basilar pneumonia presumably on the right. In ED she was noted to have O2 sats in the ED initially. EDP  discussed patient with IR and they recommended admission to hospitalist service for observation. Her hemoglobin was 9.3, from 9.5 on 1/12.   Review of Systems: The patient denies anorexia, fever, weight loss,, vision loss, decreased hearing, hoarseness, chest pain, syncope, dyspnea on exertion, peripheral edema, balance deficits, hemoptysis, melena, hematochezia, severe indigestion/heartburn, hematuria, incontinence, genital sores, muscle weakness, suspicious skin lesions, transient blindness, difficulty walking, depression, unusual weight change, abnormal bleeding, enlarged lymph nodes.   Past Medical History  Diagnosis Date  . Diabetes mellitus   .  HTN (hypertension)   . Diabetes mellitus   . Cataract   . Osteoporosis   . 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 Carpal tunnel syndrome of right wrist 02/09/2011  . TIA (transient ischemic attack) 02/09/2011  . Right renal mass   . COPD (chronic obstructive pulmonary disease)     no inhalers used   . Pneumonia dec 2012  . CHF (congestive heart failure)   . Congestive heart failure   . Arthritis     ra  . Degenerative arthritis of cervical spine 02/09/2011  . Degenerative arthritis of hip 02/09/2011  . Degenerative arthritis of knee 02/09/2011  . PONV (postoperative nausea and vomiting)     in past, none recently  . Macular degeneration     both eyes   Past Surgical History  Procedure Date  . Back surgery 6 yrs ago    lower  . Eye surgery yrs ago    both eyes cataract surgery  . Joint replacement 10 yrs ago    left hip, both knees replaced one 3 yrs ago, one 6 yrs ago  . Upper neck surgery 8 yrs ago  . Right carpal tunnel release 5 yrs go  . Right arm plate 5 yrs ago  . Both elbows surgery 10-12 yrs ago   Social History:  reports that she quit smoking about 29 years ago. Her smoking use included Cigarettes. She has a 60 pack-year smoking history. She has never used smokeless tobacco. She reports that she does not drink alcohol or use illicit drugs. where does patient live--home Can patient participate in ADLs  Allergies  Allergen Reactions  . Codeine Nausea And Vomiting  . Morphine And Related Other (See  Comments)    Pt not sure of reaction to morphine    Family History  Problem Relation Age of Onset  . Leukemia    . Cancer Mother   . Cancer Father   . Cancer Brother   . Cancer Daughter     Prior to Admission medications   Medication Sig Start Date End Date Taking? Authorizing Provider  acetaminophen (TYLENOL) 500 MG tablet Take 1,000 mg by mouth every 6 (six) hours as needed.   Yes Historical Provider, MD  acyclovir (ZOVIRAX)  200 MG capsule Take 200 mg by mouth daily.    Yes Historical Provider, MD  amitriptyline (ELAVIL) 50 MG tablet Take 50 mg by mouth at bedtime. 02/07/11  Yes Kela Millin, MD  aspirin EC 325 MG tablet Take 325 mg by mouth daily.     Yes Historical Provider, MD  enalapril (VASOTEC) 5 MG tablet Take 5 mg by mouth every morning.    Yes Historical Provider, MD  furosemide (LASIX) 40 MG tablet Take 20 mg by mouth daily.   Yes Gareth Fitzner Joselyn Glassman, MD  gabapentin (NEURONTIN) 300 MG capsule Take 1 capsule (300 mg total) by mouth 3 (three) times daily. 02/07/11  Yes Shailee Foots C Hendrick Pavich, MD  insulin aspart (NOVOLOG) 100 UNIT/ML injection Inject 0-15 Units into the skin as needed. Sugar level less than 150 do not take any Novolog, if sugar level is between 151-200 take 3 units, 201-250 take 5 units, 251-300 take 7 units 301-350 take 9 units, 351-400 take 11 units, sugar level over 400 take 15 units and notify docotor 02/07/11  Yes Tremel Setters C Anaijah Augsburger, MD  insulin detemir (LEVEMIR) 100 UNIT/ML injection Inject 30 Units into the skin at bedtime.    Yes Historical Provider, MD  ipratropium (ATROVENT) 0.02 % nebulizer solution Take 2.5 mLs (0.5 mg total) by nebulization every 6 (six) hours as needed for wheezing (for wheezing or increased shortness of breath). 03/18/12  Yes Michael Litter, PA  metoprolol tartrate (LOPRESSOR) 12.5 mg TABS Take 0.5 tablets (12.5 mg total) by mouth 2 (two) times daily. 02/07/11  Yes Kela Millin, MD  Multiple Vitamins-Minerals (MULTIVITAMINS THER. W/MINERALS) TABS Take 1 tablet by mouth daily.     Yes Historical Provider, MD  naproxen sodium (ANAPROX) 220 MG tablet Take 220 mg by mouth 2 (two) times daily as needed. For pain   Yes Historical Provider, MD  oxybutynin (DITROPAN) 5 MG tablet Take 5 mg by mouth 3 (three) times daily as needed. For muscle spasms   Yes Historical Provider, MD  predniSONE (DELTASONE) 1 MG tablet Take 3 mg by mouth every morning. 02/22/11  Yes Tamir Wallman Joselyn Glassman, MD    prochlorperazine (COMPAZINE) 5 MG tablet Take 1 tablet (5 mg total) by mouth every 6 (six) hours as needed for nausea. 03/17/12  Yes Michael Litter, PA  traMADol (ULTRAM) 50 MG tablet Take 50 mg by mouth every 8 (eight) hours as needed. For pain 03/01/12  Yes Phillips Odor, MD  traMADol (ULTRAM) 50 MG tablet Take 1 tablet (50 mg total) by mouth every 8 (eight) hours as needed for pain. 03/17/12  Yes Michael Litter, PA  ipratropium (ATROVENT) 0.02 % nebulizer solution Take 2.5 mLs (0.5 mg total) by nebulization every 6 (six) hours. 02/07/11 02/07/12  Kela Millin, MD   Physical Exam: Filed Vitals:   03/20/12 1421 03/20/12 1509 03/20/12 1607 03/20/12 1640  BP: 117/61 111/67 122/77   Pulse: 93 92 95   Temp: 98.7 F (37.1 C)  99 F (37.2 C)   TempSrc: Oral  Oral   Resp: 16 18 20    Height:   5\' 2"  (1.575 m)   Weight:    80.3 kg (177 lb 0.5 oz)  SpO2: 98% 96% 99%     Constitutional: Vital signs reviewed.  Patient is a well-developed and well-nourished  in no acute distress and cooperative with exam. Alert and oriented x2.  Head: Normocephalic and atraumatic Mouth: no erythema or exudates, MMM Eyes: PERRL, EOMI, conjunctivae normal, No scleral icterus.  Neck: Supple, Trachea midline normal ROM, No JVD, mass, thyromegaly, or carotid bruit present.  Cardiovascular: RRR, S1 normal, S2 normal, no MRG, pulses symmetric and intact bilaterally Pulmonary/Chest: Decreased air entry bilaterally, no wheezes, rales, or rhonchi Abdominal: Soft. Non-tender, non-distended, bowel sounds are normal, no masses, organomegaly, or guarding present,  no ecchymosis in flanks.  GU: no CVA tenderness Musculoskeletal: No joint deformities, erythema, or stiffness, ROM full and no nontender Hematology: no cervical, inginal, or axillary adenopathy.  Neurological: A&O x2, Strength is normal and symmetric bilaterally, cranial nerve II-XII are grossly intact, no focal motor deficit, sensory intact to light touch  bilaterally.  Skin: Warm, dry and intact. No rash, cyanosis, or clubbing.  Psychiatric: Normal mood and affect.   Labs on Admission:  Basic Metabolic Panel:  Lab 03/20/12 5621 03/18/12 0707 03/17/12 0416  NA 134* 139 140  K 4.1 4.1 3.9  CL 95* 97 99  CO2 30 37* 32  GLUCOSE 110* 108* 178*  BUN 23 27* 24*  CREATININE 0.86 1.06 1.02  CALCIUM 9.3 9.5 9.2  MG -- -- --  PHOS -- -- --   Liver Function Tests: No results found for this basename: AST:5,ALT:5,ALKPHOS:5,BILITOT:5,PROT:5,ALBUMIN:5 in the last 168 hours  Lab 03/20/12 1008  LIPASE 10*  AMYLASE --   No results found for this basename: AMMONIA:5 in the last 168 hours CBC:  Lab 03/20/12 1008 03/18/12 0707 03/17/12 0416  WBC 9.3 12.9* 9.8  NEUTROABS -- -- --  HGB 9.3* 9.5* 9.4*  HCT 28.6* 29.9* 29.6*  MCV 95.3 97.1 94.6  PLT 185 228 205   Cardiac Enzymes: No results found for this basename: CKTOTAL:5,CKMB:5,CKMBINDEX:5,TROPONINI:5 in the last 168 hours  BNP (last 3 results)  Basename 03/20/12 1008  PROBNP 1166.0*   CBG:  Lab 03/20/12 0951 03/18/12 0746 03/17/12 2111 03/17/12 1832 03/17/12 1241  GLUCAP 83 136* 127* 148* 123*    Radiological Exams on Admission: Ct Abdomen Pelvis Wo Contrast  03/20/2012  *RADIOLOGY REPORT*  Clinical Data: Diffuse abdominal pain, nausea/vomiting  CT ABDOMEN AND PELVIS WITHOUT CONTRAST  Technique:  Multidetector CT imaging of the abdomen and pelvis was performed following the standard protocol without intravenous contrast.  Comparison: Cryoablation CT dated 03/16/2012.  CT abdomen dated 02/01/2012.  Findings: Small right and trace left pleural effusions.  Associated lower lobe opacities, likely atelectasis.  Nodular hepatic contour, suggesting cirrhosis.  Stable calcifications along the hepatic dome.  Spleen is normal in size.  Pancreas and adrenal glands are within normal limits.  Layering gallstones.  No associated inflammatory changes.  Postprocedure changes in the posterior  interpolar right kidney, including tiny foci of gas (series 2/image 22).  Associated small to moderate retroperitoneal hemorrhage, measuring 4.6 x 7.6 cm in axial dimensions.  Left kidney is unremarkable.  No renal calculi or hydronephrosis.  No evidence of bowel obstruction.  Colonic diverticulosis, without associated inflammatory changes.  Atherosclerotic calcifications of the abdominal aorta and branch vessels.  No suspicious abdominopelvic lymphadenopathy.  No  abdominopelvic ascites.  Uterus is unremarkable.  No adnexal masses.  Tiny foci of nondependent the bladder.  Degenerative changes of the visualized thoracolumbar spine with lumbar dextroscoliosis.  Mild to moderate compression deformity at T12, unchanged.  Left total hip arthroplasty.  IMPRESSION: Postprocedure changes in the posterior interpolar right kidney with associated small to moderate retroperitoneal hemorrhage.  No evidence of bowel obstruction.  Cholelithiasis, without associated inflammatory changes by CT.  Small right and trace left pleural effusions.  Associated lower lobe opacities, likely atelectasis.   Original Report Authenticated By: Charline Bills, M.D.    Dg Chest 2 View  03/20/2012  *RADIOLOGY REPORT*  Clinical Data: Chest pain.  CHEST - 2 VIEW  Comparison: 03/13/2012  Findings: Heart is borderline in size.  Lingular atelectasis or scarring.  Focal opacity posteriorly on the lateral view.  This is not well visualized on the frontal view but may be in the right lung base.  Cannot exclude right posterior basilar pneumonia.  No effusions.  No acute bony abnormality.  IMPRESSION: Focal opacity posteriorly at the bases on the lateral view.  Cannot exclude basilar pneumonia, presumably on the right.   Original Report Authenticated By: Charlett Nose, M.D.      Assessment/Plan Active Problems: Retroperitoneal bleed -Possible complication of recent right renal biopsy -Monitor H&H closely and transfuse as appropriate -Interventional  radiology consult -- Dr. Fredia Sorrow to see in a.m. he recommends conservative management, at this time  and to follow and repeat a CT abdomen in a few days, and sooner only if there is a significant drop in her hemoglobin. Hypoxia/probable  Pneumonia -Empiric antibiotics for age Given her recent hospitalization, follow recheck x-ray in a.m.  Delirium --Possibly secondary to infection/pneumonia, or hypoxia treat underlying etiologies and follow. COPD -Nebulized bronchodilators, antibiotics as above. -Supplemental oxygen and follow.  CHF (congestive heart failure) -Compensated, monitor fluid status continue low-dose Lasix Diabetes mellitus -monitor Accu-Cheks, continue Levemir, cover with sliding scale.  HTN (hypertension) -Continue metoprolol, monitor blood pressures and further treat accordingly.  Polymyalgia rheumatica -Continue prednisone that she's been on it chronically monitor her. Her blood pressures are stable at this time.  Renal mass -Status post biopsy followed by cryoablation-per Dr. Fredia Sorrow. Follow up on the biopsy results.  Anemia  -Stable at this time, Monitor H&H closely given #1, follow and transfuse as appropriate. Code Status: Full Family Communication: Son at bedside Disposition Plan: Admit to telemetry  Time spent: Greater than 30 minutes  Kela Millin [facility] Pager [phone]  If 7PM-7AM, please contact night-coverage [email or web address] Password [facility] 03/20/2012, 4:42 PM

## 2012-03-20 NOTE — Progress Notes (Signed)
ANTIBIOTIC CONSULT NOTE - INITIAL  Pharmacy Consult for Vancomycin + Zosyn Indication: Suspected Pneumonia  Allergies  Allergen Reactions  . Codeine Nausea And Vomiting  . Morphine And Related Other (See Comments)    Pt not sure of reaction to morphine    Patient Measurements: Height: 5\' 2"  (157.5 cm) Weight: 177 lb 0.5 oz (80.3 kg) IBW/kg (Calculated) : 50.1   Vital Signs: Temp: 99 F (37.2 C) (01/14 1607) Temp src: Oral (01/14 1607) BP: 122/77 mmHg (01/14 1607) Pulse Rate: 95  (01/14 1607) Intake/Output from previous day:   Intake/Output from this shift:    Labs:  [facility] 03/20/12 1008 03/18/12 0707  WBC 9.3 12.9*  HGB 9.3* 9.5*  PLT 185 228  LABCREA -- --  CREATININE 0.86 1.06   Estimated Creatinine Clearance: 47 ml/min (by C-G formula based on Cr of 0.86). No results found for this basename: VANCOTROUGH:2,VANCOPEAK:2,VANCORANDOM:2,GENTTROUGH:2,GENTPEAK:2,GENTRANDOM:2,TOBRATROUGH:2,TOBRAPEAK:2,TOBRARND:2,AMIKACINPEAK:2,AMIKACINTROU:2,AMIKACIN:2, in the last 72 hours   Microbiology: No results found for this or any previous visit (from the past 720 hour(s)).  Medical History: Past Medical History  Diagnosis Date  . Diabetes mellitus   . HTN (hypertension)   . Diabetes mellitus   . Cataract   . Osteoporosis   . 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 Carpal tunnel syndrome of right wrist 02/09/2011  . TIA (transient ischemic attack) 02/09/2011  . Right renal mass   . COPD (chronic obstructive pulmonary disease)     no inhalers used   . Pneumonia dec 2012  . CHF (congestive heart failure)   . Congestive heart failure   . Arthritis     ra  . Degenerative arthritis of cervical spine 02/09/2011  . Degenerative arthritis of hip 02/09/2011  . Degenerative arthritis of knee 02/09/2011  . PONV (postoperative nausea and vomiting)     in past, none recently  . Macular degeneration     both eyes    Medications:  Scheduled:      . amitriptyline  50 mg Oral QHS  . furosemide  20 mg Oral Daily  . gabapentin  300 mg Oral TID  . insulin aspart  0-9 Units Subcutaneous TID WC  . insulin detemir  30 Units Subcutaneous QHS  . ipratropium  0.5 mg Nebulization Q6H  . levalbuterol  0.63 mg Nebulization Q6H  . metoprolol tartrate  12.5 mg Oral BID  . [COMPLETED] piperacillin-tazobactam (ZOSYN)  IV  3.375 g Intravenous Once  . predniSONE  3 mg Oral q morning - 10a  . [COMPLETED] sodium chloride  500 mL Intravenous Once  . sodium chloride  3 mL Intravenous Q12H  . [DISCONTINUED] metoprolol tartrate  12.5 mg Oral BID  . [DISCONTINUED] vancomycin  1,000 mg Intravenous Once   Infusions:    . sodium chloride     Assessment:  79 year old female presents with weakness and body aches, shortness of breath and cough.  Recent discharge from hospital (2 days ago) for kidney biopsy.  Significant PMH including CHF and COPD.  Chest XRay cannot exclude basilar pneumonia  CrCl ~ 47 ml/min  Zosyn 3.375gm IV x 1 given in ED @ 15:05.  Vancomycin 1gm IV x 1 ordered @ 14:02 but not yet given  Upon admission, IV Vancomycin + Zosyn to continue for suspected pneumonia  Goal of Therapy:  Vancomycin trough level 15-20 mcg/ml  Plan:  Measure antibiotic drug levels at steady state Follow up culture results  Zosyn 3.375gm IV q8h (each dose infused over 4 hr)  Vancomycin 750 mg IV q12h  Maryellen Pile, PharmD 03/20/2012,4:43 PM

## 2012-03-20 NOTE — ED Provider Notes (Signed)
History     CSN: [id number]  Arrival date & time 03/20/12  4098   First MD Initiated Contact with Patient 03/20/12 1036      Chief Complaint  Patient presents with  . Post-op Problem  . Generalized Body Aches  . Weakness    (Consider location/radiation/quality/duration/timing/severity/associated sxs/prior treatment) Patient is a 79 y.o. female presenting with weakness. The history is provided by the patient and a relative.  Weakness  Additional symptoms include weakness.   patient here with weakness and shortness of breath with worsening cough x3 days. Was discharged the hospital 2 days ago after having her right kidney biopsy to evaluate for possible renal cell cancer. Now complains of whole-body pain some abdominal discomfort. No syncope or near-syncope. Cough has been nonproductive and she does have as well as COPD and CHF. Symptoms have been persistent. Notes anorexia. No vomiting or diarrhea.   Past Medical History  Diagnosis Date  . Diabetes mellitus   . HTN (hypertension)   . Diabetes mellitus   . Cataract   . Osteoporosis   . 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 Carpal tunnel syndrome of right wrist 02/09/2011  . TIA (transient ischemic attack) 02/09/2011  . Right renal mass   . COPD (chronic obstructive pulmonary disease)     no inhalers used   . Pneumonia dec 2012  . CHF (congestive heart failure)   . Congestive heart failure   . Arthritis     ra  . Degenerative arthritis of cervical spine 02/09/2011  . Degenerative arthritis of hip 02/09/2011  . Degenerative arthritis of knee 02/09/2011  . PONV (postoperative nausea and vomiting)     in past, none recently  . Macular degeneration     both eyes    Past Surgical History  Procedure Date  . Back surgery 6 yrs ago    lower  . Eye surgery yrs ago    both eyes cataract surgery  . Joint replacement 10 yrs ago    left hip, both knees replaced one 3 yrs ago, one 6 yrs ago  .  Upper neck surgery 8 yrs ago  . Right carpal tunnel release 5 yrs go  . Right arm plate 5 yrs ago  . Both elbows surgery 10-12 yrs ago    Family History  Problem Relation Age of Onset  . Leukemia    . Cancer Mother   . Cancer Father   . Cancer Brother   . Cancer Daughter     History  Substance Use Topics  . Smoking status: Former Smoker -- 2.0 packs/day for 30 years    Types: Cigarettes    Quit date: 01/11/1983  . Smokeless tobacco: Never Used  . Alcohol Use: No    OB History    Grav Para Term Preterm Abortions TAB SAB Ect Mult Living                  Review of Systems  Neurological: Positive for weakness.  All other systems reviewed and are negative.    Allergies  Codeine and Morphine and related  Home Medications   Current Outpatient Rx  Name  Route  Sig  Dispense  Refill  . ACETAMINOPHEN 500 MG PO TABS   Oral   Take 1,000 mg by mouth every 6 (six) hours as needed.         . ACYCLOVIR 200 MG PO CAPS   Oral   Take 200 mg by mouth daily.          Marland Kitchen  AMITRIPTYLINE HCL 50 MG PO TABS   Oral   Take 50 mg by mouth at bedtime.         . ASPIRIN EC 325 MG PO TBEC   Oral   Take 325 mg by mouth daily.           . ENALAPRIL MALEATE 5 MG PO TABS   Oral   Take 5 mg by mouth every morning.          . FUROSEMIDE 40 MG PO TABS   Oral   Take 20 mg by mouth daily.         Marland Kitchen GABAPENTIN 300 MG PO CAPS   Oral   Take 1 capsule (300 mg total) by mouth 3 (three) times daily.   30 capsule   0   . INSULIN ASPART 100 UNIT/ML [place] SOLN   Subcutaneous   Inject 0-15 Units into the skin as needed. Sugar level less than 150 do not take any Novolog, if sugar level is between 151-200 take 3 units, 201-250 take 5 units, 251-300 take 7 units 301-350 take 9 units, 351-400 take 11 units, sugar level over 400 take 15 units and notify docotor         . INSULIN DETEMIR 100 UNIT/ML [place] SOLN   Subcutaneous   Inject 30 Units into the skin at bedtime.          .  IPRATROPIUM BROMIDE 0.02 % IN SOLN   Nebulization   Take 2.5 mLs (0.5 mg total) by nebulization every 6 (six) hours as needed for wheezing (for wheezing or increased shortness of breath).   75 mL   0     Patient needs to follow up with primary MD to cont ...   . METOPROLOL TARTRATE 12.5 MG HALF TABLET   Oral   Take 0.5 tablets (12.5 mg total) by mouth 2 (two) times daily.   60 tablet   0   . THERA M PLUS PO TABS   Oral   Take 1 tablet by mouth daily.           Marland Kitchen NAPROXEN SODIUM 220 MG PO TABS   Oral   Take 220 mg by mouth 2 (two) times daily as needed. For pain         . OXYBUTYNIN CHLORIDE 5 MG PO TABS   Oral   Take 5 mg by mouth 3 (three) times daily as needed. For muscle spasms         . PREDNISONE 1 MG PO TABS   Oral   Take 3 mg by mouth every morning.         Marland Kitchen PROCHLORPERAZINE MALEATE 5 MG PO TABS   Oral   Take 1 tablet (5 mg total) by mouth every 6 (six) hours as needed for nausea.   30 tablet   0   . TRAMADOL HCL 50 MG PO TABS   Oral   Take 50 mg by mouth every 8 (eight) hours as needed. For pain         . TRAMADOL HCL 50 MG PO TABS   Oral   Take 1 tablet (50 mg total) by mouth every 8 (eight) hours as needed for pain.   30 tablet   0   . IPRATROPIUM BROMIDE 0.02 % IN SOLN   Nebulization   Take 2.5 mLs (0.5 mg total) by nebulization every 6 (six) hours.   75 mL   0     BP 139/73  Pulse  96  Temp 98.2 F (36.8 C) (Oral)  Resp 18  SpO2 99%  Physical Exam  Nursing note and vitals reviewed. Constitutional: She is oriented to person, place, and time. She appears well-developed and well-nourished.  Non-toxic appearance. No distress.  HENT:  Head: Normocephalic and atraumatic.  Eyes: Conjunctivae normal, EOM and lids are normal. Pupils are equal, round, and reactive to light.  Neck: Normal range of motion. Neck supple. No tracheal deviation present. No mass present.  Cardiovascular: Normal rate, regular rhythm and normal heart sounds.   Exam reveals no gallop.   No murmur heard. Pulmonary/Chest: Effort normal and breath sounds normal. No stridor. No respiratory distress. She has no decreased breath sounds. She has no wheezes. She has no rhonchi. She has no rales.  Abdominal: Soft. Normal appearance and bowel sounds are normal. She exhibits no distension. There is tenderness. There is guarding and CVA tenderness. There is no rebound.    Musculoskeletal: Normal range of motion. She exhibits no edema and no tenderness.  Neurological: She is alert and oriented to person, place, and time. She has normal strength. No cranial nerve deficit or sensory deficit. GCS eye subscore is 4. GCS verbal subscore is 5. GCS motor subscore is 6.  Skin: Skin is warm and dry. No abrasion and no rash noted.  Psychiatric: She has a normal mood and affect. Her speech is normal and behavior is normal.    ED Course  Procedures (including critical care time)  Labs Reviewed  CBC - Abnormal; Notable for the following:    RBC 3.00 (*)     Hemoglobin 9.3 (*)     HCT 28.6 (*)     All other components within normal limits  GLUCOSE, CAPILLARY  BASIC METABOLIC PANEL  LIPASE, BLOOD  URINALYSIS, ROUTINE W REFLEX MICROSCOPIC  URINE CULTURE   No results found.   No diagnosis found.    MDM  Patient will be started on antibiotics for possible pneumonia. Spoke with interventional radiology about her CT scan results and they will follow along. Discussed the case with the hospitalist and she will admit        Toy Baker, MD 03/20/12 1400

## 2012-03-20 NOTE — Progress Notes (Signed)
[facility] ED CM noted CM consult for possible home health Pt previously noted to have home health CM spoke with son and pt who confirms choice of agency is [facility] (previously used-not active now) CM spoke with Susan,[facility] coordinator to follow pt while hospitalized for any needs at d/c. Son requested a call be placed to "[facility]" CM reviewed EPIC notes. CM called listed number for Dr Fredia Sorrow [phone] and updated Tammy who confirmed pt was going to be scheduled for an ablation She informed [facility] is Dr Anner Crete at [phone] Spoke with Dedra Skeens at the office who states Dr Annabell Howells will be notified of pt admission (has not seen ptsince 07/2010 per Dedra Skeens)   Choices offered   HOME HEALTH AGENCIES SERVING [facility]   Agencies that are Medicare-Certified and are affiliated with [facility] Health Agency  Telephone Number Address  [facility]   The [facility] has ownership interest in this company; however, you are under no obligation to use this agency. [phone] or  [phone] [address] [place], [place] [address] [email or web address]   Agencies that are Medicare-Certified and are not affiliated with [facility] Agency Telephone Number Address  [facility] [phone] Fax [phone] [address] [place], [place]  [address] [email or web address]  [facility] [phone] or [phone] Fax [phone] [address] [place], [place] [address] [email or web address]  [facility] [phone] Fax [phone] [address] [place], [place] [address] [email or web address]  [facility] [phone] Fax [phone] [address] [place], [place]  [address] [email or web address]    [facility] [phone] Fax [phone] [address], [address] [place], [place] [address] [email or web address]  [facility] [phone] [address] [place], [place] [address] [email or web address]  [facility] [phone]  [address] [place], [place] [address] [email or web address]  [facility] [phone] or [phone] Fax number [phone] [address] [place], [place]  [address] [email or web address]  [facility] [phone] Fax [phone] [address] [place], [place]  [address]  [facility]  [phone] Fax [phone] [address] [place], [place] [address] [email or web address]

## 2012-03-20 NOTE — ED Notes (Signed)
Pt was here on Friday and kept till Sunday for removal of a "spot" on her kidney.  Since she was released, she has been "going down hill".  C/o pain everywhere and weakness.  Pt has a chronic cough that the granddaughter states is no worse than normal.  Pt has COPD.  Pt is very pale and weak.

## 2012-03-21 ENCOUNTER — Inpatient Hospital Stay (HOSPITAL_COMMUNITY): Payer: Medicare Other

## 2012-03-21 DIAGNOSIS — R0902 Hypoxemia: Secondary | ICD-10-CM

## 2012-03-21 LAB — CBC
MCH: 30.9 pg (ref 26.0–34.0)
MCHC: 32.6 g/dL (ref 30.0–36.0)
MCV: 94.7 fL (ref 78.0–100.0)
Platelets: 230 10*3/uL (ref 150–400)
RBC: 2.82 MIL/uL — ABNORMAL LOW (ref 3.87–5.11)
RDW: 15.3 % (ref 11.5–15.5)

## 2012-03-21 LAB — HEMOGLOBIN AND HEMATOCRIT, BLOOD
HCT: 28.5 % — ABNORMAL LOW (ref 36.0–46.0)
Hemoglobin: 9.4 g/dL — ABNORMAL LOW (ref 12.0–15.0)

## 2012-03-21 LAB — BASIC METABOLIC PANEL
CO2: 30 mEq/L (ref 19–32)
Calcium: 8.8 mg/dL (ref 8.4–10.5)
Creatinine, Ser: 0.92 mg/dL (ref 0.50–1.10)
GFR calc Af Amer: 64 mL/min — ABNORMAL LOW (ref >90)
GFR calc non Af Amer: 55 mL/min — ABNORMAL LOW (ref >90)
Sodium: 132 mEq/L — ABNORMAL LOW (ref 135–145)

## 2012-03-21 LAB — HEMOGLOBIN A1C
Hgb A1c MFr Bld: 6.9 % — ABNORMAL HIGH (ref <5.7)
Mean Plasma Glucose: 151 mg/dL — ABNORMAL HIGH (ref <117)

## 2012-03-21 LAB — GLUCOSE, CAPILLARY
Glucose-Capillary: 91 mg/dL (ref 70–99)
Glucose-Capillary: 108 mg/dL — ABNORMAL HIGH (ref 70–99)
Glucose-Capillary: 155 mg/dL — ABNORMAL HIGH (ref 70–99)

## 2012-03-21 NOTE — Progress Notes (Addendum)
[facility] PROGRESS NOTE  [name] LKG:[id number] DOB: February 23, 1925 DOA: 03/20/2012 PCP: [name], MD  Assessment/Plan: Retroperitoneal bleed  -Possible complication of recent right renal biopsy  -Monitor H&H closely and transfuse as appropriate  -Interventional radiology consult -- Dr. Fredia Sorrow to see in a.m. he recommends conservative management, at this time and to follow and repeat a CT abdomen in a few days, and sooner only if there is a significant drop in her hemoglobin.  - note left on chart: Dr Fredia Sorrow [phone] Interventional radiologist - pt was going to be scheduled for an ablation [facility] is Dr Anner Crete at [phone] Spoke with Dedra Skeens at the office who states Dr Annabell Howells will be notified of pt admission    Hypoxia/probable Pneumonia  -Empiric antibiotics for age Given her recent hospitalization   Delirium  --Possibly secondary to infection/pneumonia, or hypoxia treat underlying etiologies and follow.   COPD  -Nebulized bronchodilators, antibiotics as above.  -Supplemental oxygen and follow.   CHF (congestive heart failure)  -mildly overloaded, monitor fluid status continue low-dose Lasix   Diabetes mellitus  -monitor Accu-Cheks, continue Levemir, cover with sliding scale.   HTN (hypertension)  -Continue metoprolol, monitor blood pressures and further treat accordingly.   Polymyalgia rheumatica  -Continue prednisone that she's been on it chronically monitor her. Her blood pressures are stable at this time.   Renal mass  -Status post biopsy followed by cryoablation-per Dr. Fredia Sorrow. Follow up on the biopsy results.   Anemia  -Stable at this time, Monitor H&H closely given #1, follow and transfuse as appropriate.  Recent fall with left shoulder pain -xr ay shoulder PT/OT  Dementia ?if 24 hour care can be provided by family PT eval/OT eval  Code Status: full Family Communication: grandson at bedside Disposition Plan: PT  eval   Consultants:  IR  Procedures:    Antibiotics:    HPI/Subjective: Back pain/abd pain better today  Objective: Filed Vitals:   03/21/12 0259 03/21/12 0600 03/21/12 0730 03/21/12 0816  BP:  119/72 115/75   Pulse:  83    Temp:  98.2 F (36.8 C)    TempSrc:  Oral    Resp:  18    Height:      Weight:      SpO2: 98% 96%  98%    Intake/Output Summary (Last 24 hours) at 03/21/12 0821 Last data filed at 03/21/12 0400  Gross per 24 hour  Intake    260 ml  Output    300 ml  Net    -40 ml   Filed Weights   03/20/12 1640  Weight: 80.3 kg (177 lb 0.5 oz)    Exam:   General:  Pleasant/ cooperative, not oriented to place/time  Cardiovascular: rrr  Respiratory: no wheezing, decreased BS b/L  Abdomen: +BS, soft, NT.ND  Data Reviewed: Basic Metabolic Panel:  Lab 03/21/12 5956 03/20/12 1008 03/18/12 0707 03/17/12 0416  NA 132* 134* 139 140  K 3.7 4.1 4.1 3.9  CL 92* 95* 97 99  CO2 30 30 37* 32  GLUCOSE 107* 110* 108* 178*  BUN 20 23 27* 24*  CREATININE 0.92 0.86 1.06 1.02  CALCIUM 8.8 9.3 9.5 9.2  MG -- -- -- --  PHOS -- -- -- --   Liver Function Tests: No results found for this basename: AST:5,ALT:5,ALKPHOS:5,BILITOT:5,PROT:5,ALBUMIN:5 in the last 168 hours  Lab 03/20/12 1008  LIPASE 10*  AMYLASE --   No results found for this basename: AMMONIA:5 in the last  168 hours CBC:  Lab 03/21/12 0640 03/21/12 0102 03/20/12 1653 03/20/12 1008 03/18/12 0707 03/17/12 0416  WBC -- 9.4 -- 9.3 12.9* 9.8  NEUTROABS -- -- -- -- -- --  HGB 8.7* 8.7* 8.3* 9.3* 9.5* --  HCT 26.7* 26.7* 25.1* 28.6* 29.9* --  MCV -- 94.7 -- 95.3 97.1 94.6  PLT -- 230 -- 185 228 205   Cardiac Enzymes: No results found for this basename: CKTOTAL:5,CKMB:5,CKMBINDEX:5,TROPONINI:5 in the last 168 hours BNP (last 3 results)  Basename 03/20/12 1008  PROBNP 1166.0*   CBG:  Lab 03/21/12 0744 03/20/12 2143 03/20/12 1706 03/20/12 0951 03/18/12 0746  GLUCAP 70 135* 92 83 136*     Recent Results (from the past 240 hour(s))  MRSA PCR SCREENING     Status: Normal   Collection Time   03/20/12  9:20 PM      Component Value Range Status Comment   MRSA by PCR NEGATIVE  NEGATIVE Final      Studies: Ct Abdomen Pelvis Wo Contrast  03/20/2012  *RADIOLOGY REPORT*  Clinical Data: Diffuse abdominal pain, nausea/vomiting  CT ABDOMEN AND PELVIS WITHOUT CONTRAST  Technique:  Multidetector CT imaging of the abdomen and pelvis was performed following the standard protocol without intravenous contrast.  Comparison: Cryoablation CT dated 03/16/2012.  CT abdomen dated 02/01/2012.  Findings: Small right and trace left pleural effusions.  Associated lower lobe opacities, likely atelectasis.  Nodular hepatic contour, suggesting cirrhosis.  Stable calcifications along the hepatic dome.  Spleen is normal in size.  Pancreas and adrenal glands are within normal limits.  Layering gallstones.  No associated inflammatory changes.  Postprocedure changes in the posterior interpolar right kidney, including tiny foci of gas (series 2/image 22).  Associated small to moderate retroperitoneal hemorrhage, measuring 4.6 x 7.6 cm in axial dimensions.  Left kidney is unremarkable.  No renal calculi or hydronephrosis.  No evidence of bowel obstruction.  Colonic diverticulosis, without associated inflammatory changes.  Atherosclerotic calcifications of the abdominal aorta and branch vessels.  No suspicious abdominopelvic lymphadenopathy.  No abdominopelvic ascites.  Uterus is unremarkable.  No adnexal masses.  Tiny foci of nondependent the bladder.  Degenerative changes of the visualized thoracolumbar spine with lumbar dextroscoliosis.  Mild to moderate compression deformity at T12, unchanged.  Left total hip arthroplasty.  IMPRESSION: Postprocedure changes in the posterior interpolar right kidney with associated small to moderate retroperitoneal hemorrhage.  No evidence of bowel obstruction.  Cholelithiasis, without  associated inflammatory changes by CT.  Small right and trace left pleural effusions.  Associated lower lobe opacities, likely atelectasis.   Original Report Authenticated By: Charline Bills, M.D.    Dg Chest 2 View  03/20/2012  *RADIOLOGY REPORT*  Clinical Data: Chest pain.  CHEST - 2 VIEW  Comparison: 03/13/2012  Findings: Heart is borderline in size.  Lingular atelectasis or scarring.  Focal opacity posteriorly on the lateral view.  This is not well visualized on the frontal view but may be in the right lung base.  Cannot exclude right posterior basilar pneumonia.  No effusions.  No acute bony abnormality.  IMPRESSION: Focal opacity posteriorly at the bases on the lateral view.  Cannot exclude basilar pneumonia, presumably on the right.   Original Report Authenticated By: Charlett Nose, M.D.    Portable Chest 1 View  03/21/2012  *RADIOLOGY REPORT*  Clinical Data: Chest pain, follow-up  PORTABLE CHEST - 1 VIEW  Comparison: Chest x-ray of 03/20/2012  Findings: The lungs are poorly aerated with increasing opacities bilaterally some which represents  atelectasis and possible effusions.  An element of pulmonary vascular congestion is a definite consideration.  Mild cardiomegaly is stable.  IMPRESSION: Diminished aeration with basilar atelectasis and question of pulmonary vascular congestion.   Original Report Authenticated By: Dwyane Dee, M.D.     Scheduled Meds:   . amitriptyline  50 mg Oral QHS  . furosemide  20 mg Oral Daily  . gabapentin  300 mg Oral TID  . insulin aspart  0-9 Units Subcutaneous TID WC  . insulin detemir  30 Units Subcutaneous QHS  . ipratropium  0.5 mg Nebulization Q6H  . levalbuterol  0.63 mg Nebulization Q6H  . metoprolol tartrate  12.5 mg Oral Daily  . piperacillin-tazobactam (ZOSYN)  IV  3.375 g Intravenous Q8H  . predniSONE  3 mg Oral q morning - 10a  . sodium chloride  3 mL Intravenous Q12H  . vancomycin  750 mg Intravenous Q12H   Continuous Infusions:   . sodium  chloride 20 mL/hr at 03/20/12 1741    Active Problems:  Pneumonia  Diabetes mellitus  HTN (hypertension)  Polymyalgia rheumatica  Renal mass  Anemia  Hypoxia  Retroperitoneal bleed  Delirium  CHF (congestive heart failure)    Time spent: 35    [facility], Dawood Spitler  [facility] Pager [phone]. If 8PM-8AM, please contact night-coverage at [email or web address], password [facility] 03/21/2012, 8:21 AM  LOS: 1 day

## 2012-03-21 NOTE — Progress Notes (Signed)
Clinical Social Work Department BRIEF PSYCHOSOCIAL ASSESSMENT 03/21/2012  Patient:  [name], [name]     Account Number:  [id number]     Admit date:  03/20/2012  Clinical Social Worker:  Doroteo Glassman  Date/Time:  03/21/2012 03:44 PM  Referred by:  Physician  Date Referred:  03/21/2012 Referred for  SNF Placement   Other Referral:   Interview type:  Patient Other interview type:   Pt's son and grandson at bedside and actively participate in Ax.    PSYCHOSOCIAL DATA Living Status:  FAMILY Admitted from facility:   Level of care:   Primary support name:  Algis Downs Primary support relationship to patient:  CHILD, ADULT Degree of support available:   strong    CURRENT CONCERNS Current Concerns  Post-Acute Placement   Other Concerns:    SOCIAL WORK ASSESSMENT / PLAN Met with Pt and son and grandson at bedside.    Pt stated that her preference is to d/c home with [facility] and family is trying to be respectful of this.    Family's concern, however, is that Pt has been falling frequently and that no one is with Pt during the day to assist with her ADLs.  Pt lives with son, who works but is able to check on Pt on his lunchbreak.    Pt was at [facility] last year for SNF.    Family has been followed by [facility], by hx, and was appreciative of their service.    Family and Pt unsure of d/c plan, at this time, and will defer to PT/OT's recommendation.    CSW thanked Pt and family for their time.   Assessment/plan status:  Psychosocial Support/Ongoing Assessment of Needs Other assessment/ plan:   Information/referral to community resources:   To provide if family decides on SNF for d/c plan.    PATIENT'S/FAMILY'S RESPONSE TO PLAN OF CARE: Pt and family thanked CSW for time and assistance.   CSW to continue to follow.  Providence Crosby, LCSWA Clinical Social Work [phone]

## 2012-03-21 NOTE — Progress Notes (Addendum)
Asked pt if it was okay to discuss care with her daughter, Liborio Nixon, about her care.  She stated it is fine to speak to her daughter about her care.  Pt's daughter, Liborio Nixon [phone]), called & very concerned about her mother.  She said to please xray both shoulders b/c she has had pain in both after the fall.  She stated that the pain radiates to both arms and hands.  Also, she requests to please check her for a yeast infection.

## 2012-03-21 NOTE — Progress Notes (Addendum)
Subjective: Pt c/o left shoulder pain; no nausea/vomiting; still feels weak; mild rt flank discomfort; has not eaten much  Objective: Vital signs in last 24 hours: Temp:  [98.2 F (36.8 C)-99 F (37.2 C)] 98.2 F (36.8 C) (01/15 0600) Pulse Rate:  [83-96] 83  (01/15 0600) Resp:  [16-20] 18  (01/15 0600) BP: (111-139)/(61-77) 115/75 mmHg (01/15 0730) SpO2:  [87 %-99 %] 98 % (01/15 0816) Weight:  [177 lb 0.5 oz (80.3 kg)] 177 lb 0.5 oz (80.3 kg) (01/14 1640) Last BM Date: 03/19/12  Intake/Output from previous day: 01/14 0701 - 01/15 0700 In: 260 [P.O.:60; I.V.:200] Out: 300 [Urine:300] Intake/Output this shift:    Pt awake, but sl confused- oriented to person, place, not year or day; grandson in room; chest- dim BS bases; heart- RRR; abd- soft,+BS, mildly tender rt flank region; puncture site rt flank clean and dry; ext - no edema. Urine yellow.  Lab Results:   Basename 03/21/12 0640 03/21/12 0102 03/20/12 1008  WBC -- 9.4 9.3  HGB 8.7* 8.7* --  HCT 26.7* 26.7* --  PLT -- 230 185   BMET  Basename 03/21/12 0102 03/20/12 1008  NA 132* 134*  K 3.7 4.1  CL 92* 95*  CO2 30 30  GLUCOSE 107* 110*  BUN 20 23  CREATININE 0.92 0.86  CALCIUM 8.8 9.3   PT/INR No results found for this basename: LABPROT:2,INR:2 in the last 72 hours ABG No results found for this basename: PHART:2,PCO2:2,PO2:2,HCO3:2 in the last 72 hours  Studies/Results: Ct Abdomen Pelvis Wo Contrast  03/20/2012  *RADIOLOGY REPORT*  Clinical Data: Diffuse abdominal pain, nausea/vomiting  CT ABDOMEN AND PELVIS WITHOUT CONTRAST  Technique:  Multidetector CT imaging of the abdomen and pelvis was performed following the standard protocol without intravenous contrast.  Comparison: Cryoablation CT dated 03/16/2012.  CT abdomen dated 02/01/2012.  Findings: Small right and trace left pleural effusions.  Associated lower lobe opacities, likely atelectasis.  Nodular hepatic contour, suggesting cirrhosis.  Stable  calcifications along the hepatic dome.  Spleen is normal in size.  Pancreas and adrenal glands are within normal limits.  Layering gallstones.  No associated inflammatory changes.  Postprocedure changes in the posterior interpolar right kidney, including tiny foci of gas (series 2/image 22).  Associated small to moderate retroperitoneal hemorrhage, measuring 4.6 x 7.6 cm in axial dimensions.  Left kidney is unremarkable.  No renal calculi or hydronephrosis.  No evidence of bowel obstruction.  Colonic diverticulosis, without associated inflammatory changes.  Atherosclerotic calcifications of the abdominal aorta and branch vessels.  No suspicious abdominopelvic lymphadenopathy.  No abdominopelvic ascites.  Uterus is unremarkable.  No adnexal masses.  Tiny foci of nondependent the bladder.  Degenerative changes of the visualized thoracolumbar spine with lumbar dextroscoliosis.  Mild to moderate compression deformity at T12, unchanged.  Left total hip arthroplasty.  IMPRESSION: Postprocedure changes in the posterior interpolar right kidney with associated small to moderate retroperitoneal hemorrhage.  No evidence of bowel obstruction.  Cholelithiasis, without associated inflammatory changes by CT.  Small right and trace left pleural effusions.  Associated lower lobe opacities, likely atelectasis.   Original Report Authenticated By: Charline Bills, M.D.    Dg Chest 2 View  03/20/2012  *RADIOLOGY REPORT*  Clinical Data: Chest pain.  CHEST - 2 VIEW  Comparison: 03/13/2012  Findings: Heart is borderline in size.  Lingular atelectasis or scarring.  Focal opacity posteriorly on the lateral view.  This is not well visualized on the frontal view but may be in the right lung  base.  Cannot exclude right posterior basilar pneumonia.  No effusions.  No acute bony abnormality.  IMPRESSION: Focal opacity posteriorly at the bases on the lateral view.  Cannot exclude basilar pneumonia, presumably on the right.   Original Report  Authenticated By: Charlett Nose, M.D.    Portable Chest 1 View  03/21/2012  *RADIOLOGY REPORT*  Clinical Data: Chest pain, follow-up  PORTABLE CHEST - 1 VIEW  Comparison: Chest x-ray of 03/20/2012  Findings: The lungs are poorly aerated with increasing opacities bilaterally some which represents atelectasis and possible effusions.  An element of pulmonary vascular congestion is a definite consideration.  Mild cardiomegaly is stable.  IMPRESSION: Diminished aeration with basilar atelectasis and question of pulmonary vascular congestion.   Original Report Authenticated By: Dwyane Dee, M.D.     Anti-infectives: Anti-infectives     Start     Dose/Rate Route Frequency Ordered Stop   03/20/12 2300  piperacillin-tazobactam (ZOSYN) IVPB 3.375 g       3.375 g 12.5 mL/hr over 240 Minutes Intravenous Every 8 hours 03/20/12 1649     03/20/12 1700   vancomycin (VANCOCIN) 750 mg in sodium chloride 0.9 % 150 mL IVPB        750 mg 150 mL/hr over 60 Minutes Intravenous Every 12 hours 03/20/12 1649     03/20/12 1430   vancomycin (VANCOCIN) IVPB 1000 mg/200 mL premix  Status:  Discontinued        1,000 mg 200 mL/hr over 60 Minutes Intravenous  Once 03/20/12 1402 03/20/12 1638   03/20/12 1430  piperacillin-tazobactam (ZOSYN) IVPB 3.375 g       3.375 g 100 mL/hr over 30 Minutes Intravenous  Once 03/20/12 1402 03/20/12 1535          Assessment/Plan: s/p rt renal mass bx/cryo 1/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 Would only repeat CT if Hgb drops or clinically worsens; otherwise will plan to f/u with pt in IR clinic with CT in 1-2 months. Case d/w Dr. Fredia Sorrow.   LOS: 1 day    ALLRED,D [facility] 03/21/2012

## 2012-03-22 DIAGNOSIS — I509 Heart failure, unspecified: Secondary | ICD-10-CM

## 2012-03-22 LAB — HEMOGLOBIN AND HEMATOCRIT, BLOOD: Hemoglobin: 9.4 g/dL — ABNORMAL LOW (ref 12.0–15.0)

## 2012-03-22 LAB — GLUCOSE, CAPILLARY
Glucose-Capillary: 69 mg/dL — ABNORMAL LOW (ref 70–99)
Glucose-Capillary: 209 mg/dL — ABNORMAL HIGH (ref 70–99)

## 2012-03-22 MED ORDER — ENALAPRIL MALEATE 5 MG PO TABS
5.0000 mg | ORAL_TABLET | Freq: Every morning | ORAL | Status: DC
  Administered 2012-03-22 – 2012-03-23 (×2): 5 mg via ORAL
  Filled 2012-03-22 (×2): qty 1

## 2012-03-22 MED ORDER — FUROSEMIDE 40 MG PO TABS
40.0000 mg | ORAL_TABLET | Freq: Every day | ORAL | Status: DC
  Administered 2012-03-23: 40 mg via ORAL
  Filled 2012-03-22: qty 1

## 2012-03-22 MED ORDER — VANCOMYCIN HCL 10 G IV SOLR
1250.0000 mg | Freq: Two times a day (BID) | INTRAVENOUS | Status: DC
  Administered 2012-03-23 (×2): 1250 mg via INTRAVENOUS
  Filled 2012-03-22 (×2): qty 1250

## 2012-03-22 NOTE — Progress Notes (Addendum)
Clinical Social Work Department CLINICAL SOCIAL WORK PLACEMENT NOTE 03/22/2012  Patient:  [name], [name]  Account Number:  [id number] Admit date:  03/20/2012  Clinical Social Worker:  Doroteo Glassman  Date/time:  03/22/2012 12:10 PM  Clinical Social Work is seeking post-discharge placement for this patient at the following level of care:   SKILLED NURSING   (*CSW will update this form in Epic as items are completed)   03/22/2012  Patient/family provided with [facility] Department of Clinical Social Work's list of facilities offering this level of care within the geographic area requested by the patient (or if unable, by the patient's family).  03/22/2012  Patient/family informed of their freedom to choose among providers that offer the needed level of care, that participate in Medicare, Medicaid or managed care program needed by the patient, have an available bed and are willing to accept the patient.  N/a--seeking SNF in [facility].  Patient/family informed of [facility]' ownership interest in [facility], as well as of the fact that they are under no obligation to receive care at this facility.  PASARR submitted to EDS on existing PASARR number received from EDS on   FL2 transmitted to all facilities in geographic area requested by pt/family on  03/22/2012 FL2 transmitted to all facilities within larger geographic area on   Patient informed that his/her managed care company has contracts with or will negotiate with  certain facilities, including the following:     Patient/family informed of bed offers received:  03/23/12 Patient chooses bed at [facility] Physician recommends and patient chooses bed at    Patient to be transferred to [facility] on 03/23/12 Patient to be transferred to facility by [facility]  The following physician request were entered in Epic:   Additional Comments:  Providence Crosby, Theresia Majors Clinical Social Work [phone]

## 2012-03-22 NOTE — Progress Notes (Addendum)
Chart review.  Noted that PT recommending SNF.  Discussed with Pt PT's recommendation.  Pt stated that she'd rather go home but she understands that she needs some rehab.  Pt agreed to SNF search and is hopeful that she can go back to [facility].  CSW thanked Pt for her time.  Sent Pt's information to [employer], Carollee Herter, for insurance authorization.  Providence Crosby, LCSWA Clinical Social Work [phone]

## 2012-03-22 NOTE — Progress Notes (Signed)
Subjective: Pt ok. C/o joint aches, not sure if she is getting her usual arthritis meds. Denies much pain in her back. No N/V  Objective: Physical Exam: BP 108/72  Pulse 98  Temp 98.1 F (36.7 C) (Oral)  Resp 20  Ht 5\' 2"  (1.575 m)  Wt 177 lb 0.5 oz (80.3 kg)  BMI 32.38 kg/m2  SpO2 94% Abd: soft, ND, NT   Labs: CBC  Basename 03/22/12 0700 03/22/12 0130 03/21/12 0102 03/20/12 1008  WBC -- -- 9.4 9.3  HGB 9.2* 9.4* -- --  HCT 27.8* 28.6* -- --  PLT -- -- 230 185   BMET  Basename 03/21/12 0102 03/20/12 1008  NA 132* 134*  K 3.7 4.1  CL 92* 95*  CO2 30 30  GLUCOSE 107* 110*  BUN 20 23  CREATININE 0.92 0.86  CALCIUM 8.8 9.3   LFT  Basename 03/20/12 1008  PROT --  ALBUMIN --  AST --  ALT --  ALKPHOS --  BILITOT --  BILIDIR --  IBILI --  LIPASE 10*   PT/INR No results found for this basename: LABPROT:2,INR:2 in the last 72 hours   Studies/Results: Portable Chest 1 View  03/21/2012  *RADIOLOGY REPORT*  Clinical Data: Chest pain, follow-up  PORTABLE CHEST - 1 VIEW  Comparison: Chest x-ray of 03/20/2012  Findings: The lungs are poorly aerated with increasing opacities bilaterally some which represents atelectasis and possible effusions.  An element of pulmonary vascular congestion is a definite consideration.  Mild cardiomegaly is stable.  IMPRESSION: Diminished aeration with basilar atelectasis and question of pulmonary vascular congestion.   Original Report Authenticated By: Dwyane Dee, M.D.    Dg Shoulder Left  03/21/2012  *RADIOLOGY REPORT*  Clinical Data: Left shoulder pain since a fall 3 weeks ago.  LEFT SHOULDER - 2+ VIEW  Comparison: None.  Findings: There is no fracture or dislocation. Minimal spurring on the inferior aspect of the humeral head.  Small calcification adjacent to the inferior rim of the glenoid may be a tiny loose body in the joint.  IMPRESSION: Minimal arthritic changes in the glenohumeral joint.   Original Report Authenticated By: Francene Boyers, M.D.     Assessment/Plan: s/p rt renal mass bx/cryo 1/10 (path c/w oncocytoma) with subsequent N/V, small- mod rt RP hematoma. Hgb is stable Would only repeat CT if Hgb drops or clinically worsens; otherwise will plan to f/u with pt in IR clinic with CT in 1-2 months. Case d/w Dr. Fredia Sorrow.   LOS: 2 days    Brayton El PA-C 03/22/2012 4:10 PM

## 2012-03-22 NOTE — Progress Notes (Signed)
[facility] PROGRESS NOTE  [name] ZOX:[id number] DOB: 1924-11-18 DOA: 03/20/2012 PCP: [name], MD  Assessment/Plan: Retroperitoneal bleed  -Possible complication of recent right renal biopsy  -Monitor H&H closely and transfuse as appropriate  -Interventional radiology consult -- Dr. Nicola Police c/w oncocytoma) with subsequent N/V, small- mod rt RP hematoma; cont current tx; incentive spirometry; check left shoulder film results; monitor labs; transfuse if hgb<8; check final urine cx results. Would only repeat CT if Hgb drops or clinically worsens; otherwise will plan to f/u with pt in IR clinic with CT in 1-2 months  - note left on chart: Dr Fredia Sorrow [phone] Interventional radiologist - pt was going to be scheduled for an ablation Cm [facility] is Dr Anner Crete at [phone] Spoke with Dedra Skeens at the office who states Dr Annabell Howells will be notified of pt admission- for follow up   Hypoxia/probable Pneumonia  -Empiric antibiotics for age Given her recent hospitalization   Delirium  --Possibly secondary to infection/pneumonia, or hypoxia treat underlying etiologies and follow.   COPD  -Nebulized bronchodilators, antibiotics as above.  -Supplemental oxygen and follow.   CHF (congestive heart failure)  -mildly overloaded, monitor fluid status continue low-dose Lasix   Diabetes mellitus  -monitor Accu-Cheks, continue Levemir, cover with sliding scale.   HTN (hypertension)  -Continue metoprolol, monitor blood pressures and further treat accordingly.   Polymyalgia rheumatica  -Continue prednisone that she's been on it chronically monitor her. Her blood pressures are stable at this time.   Renal mass  -Status post biopsy followed by cryoablation-per Dr. Fredia Sorrow. Follow up on the biopsy results.   Anemia  -Stable at this time, Monitor H&H closely given #1, follow and transfuse as appropriate.  Recent fall with left shoulder pain -xr ay shoulder-  arthritis PT/OT  Dementia SNF More oriented today  Code Status: full Family Communication: grandson at bedside/son by phone Disposition Plan: SNF when bed available   Consultants:  IR  Procedures:    Antibiotics:    HPI/Subjective: Mentation better No SOB reluctantly agrees to go to SNF  Objective: Filed Vitals:   03/21/12 2200 03/22/12 0223 03/22/12 0645 03/22/12 0910  BP: 120/78  123/72   Pulse: 97  97   Temp: 98.1 F (36.7 C)  98.4 F (36.9 C)   TempSrc: Oral     Resp: 20 20 20    Height:      Weight:      SpO2: 94%  96% 95%    Intake/Output Summary (Last 24 hours) at 03/22/12 1047 Last data filed at 03/22/12 0208  Gross per 24 hour  Intake 592.66 ml  Output      0 ml  Net 592.66 ml   Filed Weights   03/20/12 1640  Weight: 80.3 kg (177 lb 0.5 oz)    Exam:   General:  Pleasant/ cooperative, oriented x 3  Cardiovascular: rrr  Respiratory: no wheezing, decreased BS b/L  Abdomen: +BS, soft, NT.ND  Data Reviewed: Basic Metabolic Panel:  Lab 03/21/12 5621 03/20/12 1008 03/18/12 0707 03/17/12 0416  NA 132* 134* 139 140  K 3.7 4.1 4.1 3.9  CL 92* 95* 97 99  CO2 30 30 37* 32  GLUCOSE 107* 110* 108* 178*  BUN 20 23 27* 24*  CREATININE 0.92 0.86 1.06 1.02  CALCIUM 8.8 9.3 9.5 9.2  MG -- -- -- --  PHOS -- -- -- --   Liver Function Tests: No results found for this basename: AST:5,ALT:5,ALKPHOS:5,BILITOT:5,PROT:5,ALBUMIN:5 in the last 168 hours  Lab 03/20/12 1008  LIPASE 10*  AMYLASE --   No results found for this basename: AMMONIA:5 in the last 168 hours CBC:  Lab 03/22/12 0700 03/22/12 0130 03/21/12 1912 03/21/12 1250 03/21/12 0640 03/21/12 0102 03/20/12 1008 03/18/12 0707 03/17/12 0416  WBC -- -- -- -- -- 9.4 9.3 12.9* 9.8  NEUTROABS -- -- -- -- -- -- -- -- --  HGB 9.2* 9.4* 9.4* 9.4* 8.7* -- -- -- --  HCT 27.8* 28.6* 28.5* 28.2* 26.7* -- -- -- --  MCV -- -- -- -- -- 94.7 95.3 97.1 94.6  PLT -- -- -- -- -- 230 185 228 205    Cardiac Enzymes: No results found for this basename: CKTOTAL:5,CKMB:5,CKMBINDEX:5,TROPONINI:5 in the last 168 hours BNP (last 3 results)  Basename 03/20/12 1008  PROBNP 1166.0*   CBG:  Lab 03/22/12 0927 03/22/12 0846 03/21/12 2143 03/21/12 1725 03/21/12 1151  GLUCAP 112* 69* 155* 108* 91    Recent Results (from the past 240 hour(s))  URINE CULTURE     Status: Normal (Preliminary result)   Collection Time   03/20/12  3:17 PM      Component Value Range Status Comment   Specimen Description URINE, CLEAN CATCH   Final    Special Requests NONE   Final    Culture  Setup Time 03/21/2012 01:30   Final    Colony Count PENDING   Incomplete    Culture Culture reincubated for better growth   Final    Report Status PENDING   Incomplete   MRSA PCR SCREENING     Status: Normal   Collection Time   03/20/12  9:20 PM      Component Value Range Status Comment   MRSA by PCR NEGATIVE  NEGATIVE Final      Studies: Ct Abdomen Pelvis Wo Contrast  03/20/2012  *RADIOLOGY REPORT*  Clinical Data: Diffuse abdominal pain, nausea/vomiting  CT ABDOMEN AND PELVIS WITHOUT CONTRAST  Technique:  Multidetector CT imaging of the abdomen and pelvis was performed following the standard protocol without intravenous contrast.  Comparison: Cryoablation CT dated 03/16/2012.  CT abdomen dated 02/01/2012.  Findings: Small right and trace left pleural effusions.  Associated lower lobe opacities, likely atelectasis.  Nodular hepatic contour, suggesting cirrhosis.  Stable calcifications along the hepatic dome.  Spleen is normal in size.  Pancreas and adrenal glands are within normal limits.  Layering gallstones.  No associated inflammatory changes.  Postprocedure changes in the posterior interpolar right kidney, including tiny foci of gas (series 2/image 22).  Associated small to moderate retroperitoneal hemorrhage, measuring 4.6 x 7.6 cm in axial dimensions.  Left kidney is unremarkable.  No renal calculi or hydronephrosis.  No  evidence of bowel obstruction.  Colonic diverticulosis, without associated inflammatory changes.  Atherosclerotic calcifications of the abdominal aorta and branch vessels.  No suspicious abdominopelvic lymphadenopathy.  No abdominopelvic ascites.  Uterus is unremarkable.  No adnexal masses.  Tiny foci of nondependent the bladder.  Degenerative changes of the visualized thoracolumbar spine with lumbar dextroscoliosis.  Mild to moderate compression deformity at T12, unchanged.  Left total hip arthroplasty.  IMPRESSION: Postprocedure changes in the posterior interpolar right kidney with associated small to moderate retroperitoneal hemorrhage.  No evidence of bowel obstruction.  Cholelithiasis, without associated inflammatory changes by CT.  Small right and trace left pleural effusions.  Associated lower lobe opacities, likely atelectasis.   Original Report Authenticated By: Charline Bills, M.D.    Dg Chest 2 View  03/20/2012  *RADIOLOGY REPORT*  Clinical  Data: Chest pain.  CHEST - 2 VIEW  Comparison: 03/13/2012  Findings: Heart is borderline in size.  Lingular atelectasis or scarring.  Focal opacity posteriorly on the lateral view.  This is not well visualized on the frontal view but may be in the right lung base.  Cannot exclude right posterior basilar pneumonia.  No effusions.  No acute bony abnormality.  IMPRESSION: Focal opacity posteriorly at the bases on the lateral view.  Cannot exclude basilar pneumonia, presumably on the right.   Original Report Authenticated By: Charlett Nose, M.D.    Portable Chest 1 View  03/21/2012  *RADIOLOGY REPORT*  Clinical Data: Chest pain, follow-up  PORTABLE CHEST - 1 VIEW  Comparison: Chest x-ray of 03/20/2012  Findings: The lungs are poorly aerated with increasing opacities bilaterally some which represents atelectasis and possible effusions.  An element of pulmonary vascular congestion is a definite consideration.  Mild cardiomegaly is stable.  IMPRESSION: Diminished  aeration with basilar atelectasis and question of pulmonary vascular congestion.   Original Report Authenticated By: Dwyane Dee, M.D.    Dg Shoulder Left  03/21/2012  *RADIOLOGY REPORT*  Clinical Data: Left shoulder pain since a fall 3 weeks ago.  LEFT SHOULDER - 2+ VIEW  Comparison: None.  Findings: There is no fracture or dislocation. Minimal spurring on the inferior aspect of the humeral head.  Small calcification adjacent to the inferior rim of the glenoid may be a tiny loose body in the joint.  IMPRESSION: Minimal arthritic changes in the glenohumeral joint.   Original Report Authenticated By: Francene Boyers, M.D.     Scheduled Meds:    . amitriptyline  50 mg Oral QHS  . furosemide  20 mg Oral Daily  . gabapentin  300 mg Oral TID  . insulin aspart  0-9 Units Subcutaneous TID WC  . insulin detemir  30 Units Subcutaneous QHS  . ipratropium  0.5 mg Nebulization Q6H  . levalbuterol  0.63 mg Nebulization Q6H  . metoprolol tartrate  12.5 mg Oral Daily  . piperacillin-tazobactam (ZOSYN)  IV  3.375 g Intravenous Q8H  . predniSONE  3 mg Oral q morning - 10a  . sodium chloride  3 mL Intravenous Q12H  . vancomycin  750 mg Intravenous Q12H   Continuous Infusions:    . sodium chloride 20 mL/hr at 03/20/12 1741    Active Problems:  Pneumonia  Diabetes mellitus  HTN (hypertension)  Polymyalgia rheumatica  Renal mass  Anemia  Hypoxia  Retroperitoneal bleed  Delirium  CHF (congestive heart failure)    Time spent: 35    [facility], JESSICA  [facility] Pager [phone]. If 8PM-8AM, please contact night-coverage at [email or web address], password [facility] 03/22/2012, 10:47 AM  LOS: 2 days

## 2012-03-22 NOTE — Evaluation (Signed)
Occupational Therapy Evaluation Patient Details Name: [name] MRN: [id number] DOB: [birth year] Today's Date: 03/22/2012 Time: 1009-1040 OT Time Calculation (min): 31 min  OT Assessment / Plan / Recommendation Clinical Impression  79 y.o. female admitted with R flank pain; Dx of retroperitoneal bleed (Possible complication of recent right renal biopsy for renal mass). Skilled OT indicated to maximize independence with BADLs to min-mod A level in prep for d/c to next venue of care.    OT Assessment  Patient needs continued OT Services    Follow Up Recommendations  SNF;Supervision/Assistance - 24 hour    Barriers to Discharge Decreased caregiver support    Equipment Recommendations  3 in 1 bedside comode    Recommendations for Other Services    Frequency  Min 2X/week    Precautions / Restrictions Precautions Precautions: Fall Precaution Comments: pt/family report several recent falls. Restrictions Weight Bearing Restrictions: No   Pertinent Vitals/Pain Pt reported 8/10 neck pain on L side. Repositioned and RN aware.    ADL  Grooming: Wash/dry hands;Minimal assistance Where Assessed - Grooming: Supported standing Upper Body Bathing: Moderate assistance Where Assessed - Upper Body Bathing: Unsupported sitting Lower Body Bathing: Maximal assistance Where Assessed - Lower Body Bathing: Supported sit to stand Upper Body Dressing: Moderate assistance Where Assessed - Upper Body Dressing: Unsupported sitting Lower Body Dressing: +1 Total assistance Where Assessed - Lower Body Dressing: Supported sit to stand Toilet Transfer: +2 Total assistance Toilet Transfer: Patient Percentage: 50% Toilet Transfer Method: Sit to Barista: Raised toilet seat with arms (or 3-in-1 over toilet) Toileting - Clothing Manipulation and Hygiene: +1 Total assistance Where Assessed - Toileting Clothing Manipulation and Hygiene: Sit to stand from 3-in-1 or toilet Equipment  Used: Rolling walker;Gait belt Transfers/Ambulation Related to ADLs: As pt ambulated to the bathroom, she became incontinent of urine. Did not appear aware that she was urinating. ADL Comments: Pt was able to stand unsupported to allow for peri care with assist of 1 person. Fatigues quickly.    OT Diagnosis: Generalized weakness  OT Problem List: Decreased strength;Decreased cognition;Decreased safety awareness;Decreased activity tolerance;Decreased knowledge of use of DME or AE;Pain;Impaired balance (sitting and/or standing) OT Treatment Interventions: Self-care/ADL training;Therapeutic activities;DME and/or AE instruction;Patient/family education;Balance training   OT Goals Acute Rehab OT Goals OT Goal Formulation: With patient/family Time For Goal Achievement: 04/05/12 Potential to Achieve Goals: Good ADL Goals Pt Will Perform Grooming: with set-up;Sitting, chair;Sitting, edge of bed;Unsupported ADL Goal: Grooming - Progress: Goal set today Pt Will Transfer to Toilet: with min assist;Ambulation;with DME;Stand pivot transfer ADL Goal: Toilet Transfer - Progress: Goal set today Pt Will Perform Toileting - Clothing Manipulation: with mod assist;Sitting on 3-in-1 or toilet;Standing ADL Goal: Toileting - Clothing Manipulation - Progress: Goal set today Pt Will Perform Toileting - Hygiene: with mod assist;Sit to stand from 3-in-1/toilet ADL Goal: Toileting - Hygiene - Progress: Goal set today Additional ADL Goal #1: Pt will complete all aspects of bathing and dressing with min-mod A taking prn RBs without prompting. ADL Goal: Additional Goal #1 - Progress: Goal set today  Visit Information  Last OT Received On: 03/22/12 Assistance Needed: +2 (for safety)    Subjective Data  Subjective: The side of my neck hurts from when I fell. Patient Stated Goal: Not asked.   Prior Functioning     Home Living Lives With: Son Available Help at Discharge: Available PRN/intermittently Type of  Home: Other (Comment) (townhouse) Home Access: Stairs to enter [employer] of Steps: 4 Entrance Stairs-Rails: Right  Home Layout: One level Bathroom Shower/Tub: Engineer, manufacturing systems: Standard Home Adaptive Equipment: Tub transfer bench;Straight cane;Walker - four wheeled Prior Function Level of Independence: Independent with assistive device(s) Able to Take Stairs?: Yes Driving: Yes Vocation: Retired Musician: No difficulties Dominant Hand: Right         Vision/Perception     Cognition  Overall Cognitive Status: Impaired Area of Impairment: Memory;Safety/judgement Arousal/Alertness: Awake/alert Orientation Level: Appears intact for tasks assessed Behavior During Session: [facility] for tasks performed Memory Deficits: pt able to state month and year but not day, family provided most of Hx as pt had decreased recall of prior functional level  Safety/Judgement: Decreased awareness of safety precautions;Decreased safety judgement for tasks assessed    Extremity/Trunk Assessment Right Upper Extremity Assessment RUE ROM/Strength/Tone: [facility] for tasks assessed Left Upper Extremity Assessment LUE ROM/Strength/Tone: Unable to fully assess;Due to pain Right Lower Extremity Assessment RLE ROM/Strength/Tone: Within functional levels RLE Sensation: WFL - Light Touch RLE Coordination: WFL - gross/fine motor Left Lower Extremity Assessment LLE ROM/Strength/Tone: Within functional levels LLE Sensation: WFL - Light Touch LLE Coordination: WFL - gross/fine motor Trunk Assessment Trunk Assessment: Kyphotic     Mobility Bed Mobility Bed Mobility: Supine to Sit Supine to Sit: 2: Max assist Details for Bed Mobility Assistance: increased time and effort; pt 45% Transfers Sit to Stand: 1: +2 Total assist;From bed Sit to Stand: Patient Percentage: 40% Stand to Sit: 1: +2 Total assist;To chair/3-in-1;With armrests;With upper extremity assist Stand to  Sit: Patient Percentage: 40% Details for Transfer Assistance: increased time and effort, VCs for hand placement and to correct flexed posture, +2 for safety     Shoulder Instructions     Exercise     Balance Balance Balance Assessed: Yes Static Sitting Balance Static Sitting - Balance Support: Bilateral upper extremity supported;Feet supported Static Sitting - Level of Assistance: 4: Min assist;5: Stand by assistance Static Sitting - Comment/# of Minutes: min A to SBA for intermittent posterior lean while sitting on EOB   End of Session OT - End of Session Equipment Utilized During Treatment: Gait belt Activity Tolerance: Patient limited by fatigue Patient left: in chair;with call bell/phone within reach  GO     Kaniel Kiang A OTR/L [phone] 03/22/2012, 12:38 PM

## 2012-03-22 NOTE — Progress Notes (Signed)
ANTIBIOTIC CONSULT NOTE   Pharmacy Consult for Vancomycin + Zosyn Indication: Suspected Pneumonia  Allergies  Allergen Reactions  . Codeine Nausea And Vomiting  . Morphine And Related Other (See Comments)    Pt not sure of reaction to morphine    Patient Measurements: Height: 5\' 2"  (157.5 cm) Weight: 177 lb 0.5 oz (80.3 kg) IBW/kg (Calculated) : 50.1   Vital Signs: Temp: 98.4 F (36.9 C) (01/16 0645) BP: 123/72 mmHg (01/16 0645) Pulse Rate: 97  (01/16 0645) Intake/Output from previous day: 01/15 0701 - 01/16 0700 In: 652.7 [P.O.:210; I.V.:442.7] Out: -  Intake/Output from this shift:    Labs:  Basename 03/22/12 0700 03/22/12 0130 03/21/12 1912 03/21/12 0102 03/20/12 1008  WBC -- -- -- 9.4 9.3  HGB 9.2* 9.4* 9.4* -- --  PLT -- -- -- 230 185  LABCREA -- -- -- -- --  CREATININE -- -- -- 0.92 0.86   Estimated Creatinine Clearance: 43.9 ml/min (by C-G formula based on Cr of 0.92). No results found for this basename: VANCOTROUGH:2,VANCOPEAK:2,VANCORANDOM:2,GENTTROUGH:2,GENTPEAK:2,GENTRANDOM:2,TOBRATROUGH:2,TOBRAPEAK:2,TOBRARND:2,AMIKACINPEAK:2,AMIKACINTROU:2,AMIKACIN:2, in the last 72 hours   Microbiology: Recent Results (from the past 720 hour(s))  URINE CULTURE     Status: Normal (Preliminary result)   Collection Time   03/20/12  3:17 PM      Component Value Range Status Comment   Specimen Description URINE, CLEAN CATCH   Final    Special Requests NONE   Final    Culture  Setup Time 03/21/2012 01:30   Final    Colony Count PENDING   Incomplete    Culture Culture reincubated for better growth   Final    Report Status PENDING   Incomplete   MRSA PCR SCREENING     Status: Normal   Collection Time   03/20/12  9:20 PM      Component Value Range Status Comment   MRSA by PCR NEGATIVE  NEGATIVE Final     Medical History: Past Medical History  Diagnosis Date  . Diabetes mellitus   . HTN (hypertension)   . Diabetes mellitus   . Cataract   . Osteoporosis   . 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 Carpal tunnel syndrome of right wrist 02/09/2011  . TIA (transient ischemic attack) 02/09/2011  . Right renal mass   . COPD (chronic obstructive pulmonary disease)     no inhalers used   . Pneumonia dec 2012  . CHF (congestive heart failure)   . Congestive heart failure   . Arthritis     ra  . Degenerative arthritis of cervical spine 02/09/2011  . Degenerative arthritis of hip 02/09/2011  . Degenerative arthritis of knee 02/09/2011  . PONV (postoperative nausea and vomiting)     in past, none recently  . Macular degeneration     both eyes    Medications:  Scheduled:     . amitriptyline  50 mg Oral QHS  . furosemide  20 mg Oral Daily  . gabapentin  300 mg Oral TID  . insulin aspart  0-9 Units Subcutaneous TID WC  . insulin detemir  30 Units Subcutaneous QHS  . ipratropium  0.5 mg Nebulization Q6H  . levalbuterol  0.63 mg Nebulization Q6H  . metoprolol tartrate  12.5 mg Oral Daily  . piperacillin-tazobactam (ZOSYN)  IV  3.375 g Intravenous Q8H  . predniSONE  3 mg Oral q morning - 10a  . sodium chloride  3 mL Intravenous Q12H  . vancomycin  750 mg Intravenous Q12H  Infusions:     . sodium chloride 20 mL/hr at 03/20/12 1741   Assessment:  79 year old female presents with weakness and body aches, shortness of breath and cough.  Recent discharge from hospital (2 days ago) for kidney biopsy.  Significant PMH including CHF and COPD.  Chest XRay cannot exclude basilar pneumonia  1/14 >> Zosyn >> 1/14 >> Vanc >>   Tmax: AFeb WBCs: 9.4 on 1/15 Renal: 0.92 on 1/15  1/14 MRSA PCR: neg 1/14 urine: re-incubated  Dose changes/drug level info:  1/16: Vanco trough = ____mcg/ml on 750mg  IV q12h  Goal of Therapy:  Vancomycin trough level 15-20 mcg/ml  Plan:   Day #3 vanco/zosyn  Zosyn 3.375gm IV q8h (each dose infused over 4 hr) remains appropriate for renal fx  Vancomycin 750 mg IV q12h remains  appropriate but will need vancomycin trough drawn tonight if vancomycin continues  Consider de-escalation as cultures unrevealing, afebrile, and no leukocytosis  Juliette Alcide, PharmD, BCPS.   Pager: [phone] 03/22/2012,10:32 AM

## 2012-03-22 NOTE — Evaluation (Signed)
Physical Therapy Evaluation Patient Details Name: [name] MRN: [id number] DOB: 05/08/1924 Today's Date: 03/22/2012 Time: 1478-2956 PT Time Calculation (min): 27 min  PT Assessment / Plan / Recommendation Clinical Impression  79 y.o. female admitted with R flank pain; Dx of retroperitoneal bleed (Possible complication of recent right renal biopsy for renal mass). Pt would benefit from acute PT to maximize safety and independence with mobility. 24* assist/supervision recommended due to fall risk.     PT Assessment  Patient needs continued PT services    Follow Up Recommendations  SNF    Does the patient have the potential to tolerate intense rehabilitation      Barriers to Discharge Decreased caregiver support family not available 24*    Equipment Recommendations  None recommended by PT    Recommendations for Other Services OT consult   Frequency Min 3X/week    Precautions / Restrictions Precautions Precautions: Fall Precaution Comments: pt/family report several recent falls. Restrictions Weight Bearing Restrictions: No   Pertinent Vitals/Pain **8/10 L neck RN aware*      Mobility  Bed Mobility Bed Mobility: Supine to Sit Supine to Sit: 2: Max assist Details for Bed Mobility Assistance: increased time and effort; pt 45% Transfers Transfers: Sit to Stand;Stand to Sit Sit to Stand: 1: +2 Total assist;From bed Sit to Stand: Patient Percentage: 40% Stand to Sit: 1: +2 Total assist;To chair/3-in-1;With armrests;With upper extremity assist Stand to Sit: Patient Percentage: 40% Details for Transfer Assistance: increased time and effort, VCs for hand placement and to correct flexed posture, +2 for safety Ambulation/Gait Ambulation/Gait Assistance: 1: +2 Total assist Ambulation/Gait: Patient Percentage: 60% Ambulation Distance (Feet): 24 Feet (12' x 2 (bed to toilet to recliner)) Assistive device: Rolling walker Gait Pattern: Step-to pattern;Decreased step length  - right;Decreased step length - left;Trunk flexed General Gait Details: VCs and manual cues for flexed trunk and for positioning in RW, pt tends to walk too far behind frame of RW, pt incontinent of urine while walking to bathroom; +2 for safety    Shoulder Instructions     Exercises     PT Diagnosis: Difficulty walking;Generalized weakness;Altered mental status  PT Problem List: Decreased activity tolerance;Decreased mobility;Decreased cognition;Decreased knowledge of use of DME;Decreased safety awareness PT Treatment Interventions: DME instruction;Gait training;Functional mobility training;Therapeutic exercise;Therapeutic activities;Patient/family education   PT Goals Acute Rehab PT Goals PT Goal Formulation: With patient/family Time For Goal Achievement: 04/05/12 Potential to Achieve Goals: Fair Pt will go Supine/Side to Sit: with min assist PT Goal: Supine/Side to Sit - Progress: Goal set today Pt will go Sit to Stand: with min assist PT Goal: Sit to Stand - Progress: Goal set today Pt will Ambulate: 51 - 150 feet;with min assist PT Goal: Ambulate - Progress: Goal set today Pt will Perform Home Exercise Program: with min assist PT Goal: Perform Home Exercise Program - Progress: Goal set today  Visit Information  Last PT Received On: 03/22/12 Assistance Needed: +2    Subjective Data  Subjective: I've had a couple falls.  Patient Stated Goal: none stated   Prior Functioning  Home Living Lives With: Son Available Help at Discharge: Available PRN/intermittently Type of Home: Other (Comment) (townhouse) Home Access: Stairs to enter [employer] of Steps: 4 Entrance Stairs-Rails: Right Home Layout: One level Bathroom Shower/Tub: Engineer, manufacturing systems: Standard Home Adaptive Equipment: Tub transfer bench;Straight cane;Walker - four wheeled Prior Function Level of Independence: Independent with assistive device(s) Able to Take Stairs?: Yes Driving:  Yes Vocation: Retired Musician: No  difficulties Dominant Hand: Right    Cognition  Overall Cognitive Status: Impaired Area of Impairment: Memory Arousal/Alertness: Awake/alert Orientation Level: Appears intact for tasks assessed Behavior During Session: [facility] for tasks performed Memory Deficits: pt able to state month and year but not day, family provided most of Hx as pt had decreased recall of prior functional level    Extremity/Trunk Assessment Right Upper Extremity Assessment RUE ROM/Strength/Tone: [facility] for tasks assessed Left Upper Extremity Assessment LUE ROM/Strength/Tone: WFL for tasks assessed Right Lower Extremity Assessment RLE ROM/Strength/Tone: Within functional levels RLE Sensation: WFL - Light Touch RLE Coordination: WFL - gross/fine motor Left Lower Extremity Assessment LLE ROM/Strength/Tone: Within functional levels LLE Sensation: WFL - Light Touch LLE Coordination: WFL - gross/fine motor Trunk Assessment Trunk Assessment: Kyphotic   Balance Balance Balance Assessed: Yes Static Sitting Balance Static Sitting - Balance Support: Bilateral upper extremity supported;Feet supported Static Sitting - Level of Assistance: 4: Min assist;5: Stand by assistance Static Sitting - Comment/# of Minutes: min A to SBA for intermittent posterior lean while sitting on EOB  End of Session PT - End of Session Activity Tolerance: Patient tolerated treatment well Patient left: in chair;with call bell/phone within reach;with family/visitor present Nurse Communication: Mobility status  GP     Ralene Bathe Kistler 03/22/2012, 11:19 AM  [phone]

## 2012-03-22 NOTE — Progress Notes (Signed)
ANTIBIOTIC CONSULT NOTE   Pharmacy Consult for Vancomycin + Zosyn Indication: Suspected Pneumonia  Allergies  Allergen Reactions  . Codeine Nausea And Vomiting  . Morphine And Related Other (See Comments)    Pt not sure of reaction to morphine    Patient Measurements: Height: 5\' 2"  (157.5 cm) Weight: 177 lb 0.5 oz (80.3 kg) IBW/kg (Calculated) : 50.1   Vital Signs: Temp: 98.1 F (36.7 C) (01/16 1444) Temp src: Oral (01/16 1444) BP: 108/72 mmHg (01/16 1444) Pulse Rate: 98  (01/16 1444) Intake/Output from previous day: 01/15 0701 - 01/16 0700 In: 652.7 [P.O.:210; I.V.:442.7] Out: -  Intake/Output from this shift: Total I/O In: -  Out: 100 [Urine:100]  Labs:  Basename 03/22/12 0700 03/22/12 0130 03/21/12 1912 03/21/12 0102 03/20/12 1008  WBC -- -- -- 9.4 9.3  HGB 9.2* 9.4* 9.4* -- --  PLT -- -- -- 230 185  LABCREA -- -- -- -- --  CREATININE -- -- -- 0.92 0.86   Estimated Creatinine Clearance: 43.9 ml/min (by C-G formula based on Cr of 0.92).  Basename 03/22/12 1627  VANCOTROUGH 9.4*  VANCOPEAK --  Drue Dun --  GENTTROUGH --  GENTPEAK --  GENTRANDOM --  TOBRATROUGH --  TOBRAPEAK --  TOBRARND --  AMIKACINPEAK --  AMIKACINTROU --  AMIKACIN --     Microbiology: Recent Results (from the past 720 hour(s))  URINE CULTURE     Status: Normal (Preliminary result)   Collection Time   03/20/12  3:17 PM      Component Value Range Status Comment   Specimen Description URINE, CLEAN CATCH   Final    Special Requests NONE   Final    Culture  Setup Time 03/21/2012 01:30   Final    Colony Count PENDING   Incomplete    Culture Culture reincubated for better growth   Final    Report Status PENDING   Incomplete   MRSA PCR SCREENING     Status: Normal   Collection Time   03/20/12  9:20 PM      Component Value Range Status Comment   MRSA by PCR NEGATIVE  NEGATIVE Final     Medical History: Past Medical History  Diagnosis Date  . Diabetes mellitus   . HTN  (hypertension)   . Diabetes mellitus   . Cataract   . Osteoporosis   . 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 Carpal tunnel syndrome of right wrist 02/09/2011  . TIA (transient ischemic attack) 02/09/2011  . Right renal mass   . COPD (chronic obstructive pulmonary disease)     no inhalers used   . Pneumonia dec 2012  . CHF (congestive heart failure)   . Congestive heart failure   . Arthritis     ra  . Degenerative arthritis of cervical spine 02/09/2011  . Degenerative arthritis of hip 02/09/2011  . Degenerative arthritis of knee 02/09/2011  . PONV (postoperative nausea and vomiting)     in past, none recently  . Macular degeneration     both eyes    Medications:  Scheduled:     . amitriptyline  50 mg Oral QHS  . enalapril  5 mg Oral q morning - 10a  . furosemide  40 mg Oral Daily  . gabapentin  300 mg Oral TID  . insulin aspart  0-9 Units Subcutaneous TID WC  . insulin detemir  30 Units Subcutaneous QHS  . ipratropium  0.5 mg Nebulization Q6H  . levalbuterol  0.63  mg Nebulization Q6H  . metoprolol tartrate  12.5 mg Oral Daily  . piperacillin-tazobactam (ZOSYN)  IV  3.375 g Intravenous Q8H  . predniSONE  3 mg Oral q morning - 10a  . sodium chloride  3 mL Intravenous Q12H  . vancomycin  750 mg Intravenous Q12H  . [DISCONTINUED] furosemide  20 mg Oral Daily   Infusions:     . sodium chloride 20 mL/hr at 03/20/12 1741   Assessment:  79 year old female presents with weakness and body aches, shortness of breath and cough.  Recent discharge from hospital (2 days ago) for kidney biopsy.  Significant PMH including CHF and COPD.  Chest XRay cannot exclude basilar pneumonia  1/14 >> Zosyn >> 1/14 >> Vanc >>   Tmax: AFeb WBCs: 9.4 on 1/15 Renal: 0.92 on 1/15  1/14 MRSA PCR: neg 1/14 urine: re-incubated  Dose changes/drug level info:  1/16: Vanco trough = 9.4 mcg/ml on 750mg  IV q12h - subtherapeutic on 750mg  IV q12h  Goal of  Therapy:  Vancomycin trough level 15-20 mcg/ml  Plan:   Continue Zosyn 3.375gm IV q8h (each dose infused over 4 hr) remains appropriate for renal fx  Increase Vancomycin to  1250 mg IV q12h   Repeat VT if necessary  Consider de-escalation as cultures unrevealing, afebrile, and no leukocytosis  Gwen Her PharmD  [phone] 03/22/2012 5:28 PM

## 2012-03-23 ENCOUNTER — Inpatient Hospital Stay (HOSPITAL_COMMUNITY): Payer: Medicare Other

## 2012-03-23 LAB — BASIC METABOLIC PANEL
BUN: 29 mg/dL — ABNORMAL HIGH (ref 6–23)
CO2: 30 mEq/L (ref 19–32)
Chloride: 95 mEq/L — ABNORMAL LOW (ref 96–112)
Creatinine, Ser: 0.98 mg/dL (ref 0.50–1.10)
GFR calc Af Amer: 59 mL/min — ABNORMAL LOW (ref >90)
Glucose, Bld: 137 mg/dL — ABNORMAL HIGH (ref 70–99)

## 2012-03-23 LAB — CBC
HCT: 27 % — ABNORMAL LOW (ref 36.0–46.0)
Hemoglobin: 8.9 g/dL — ABNORMAL LOW (ref 12.0–15.0)
MCV: 93.8 fL (ref 78.0–100.0)
RDW: 15.6 % — ABNORMAL HIGH (ref 11.5–15.5)
WBC: 9.1 10*3/uL (ref 4.0–10.5)

## 2012-03-23 LAB — GLUCOSE, CAPILLARY: Glucose-Capillary: 127 mg/dL — ABNORMAL HIGH (ref 70–99)

## 2012-03-23 LAB — URINE CULTURE

## 2012-03-23 MED ORDER — FUROSEMIDE 40 MG PO TABS: 40.0000 mg | ORAL_TABLET | Freq: Every day | ORAL | Status: DC

## 2012-03-23 MED ORDER — CEPHALEXIN 500 MG PO CAPS: 500.0000 mg | ORAL_CAPSULE | Freq: Four times a day (QID) | ORAL | Status: DC

## 2012-03-23 MED ORDER — LEVOFLOXACIN 750 MG PO TABS: 750.0000 mg | ORAL_TABLET | Freq: Every day | ORAL | Status: DC

## 2012-03-23 MED ORDER — TRAMADOL HCL 50 MG PO TABS: 50.0000 mg | ORAL_TABLET | Freq: Three times a day (TID) | ORAL | Status: DC | PRN

## 2012-03-23 MED ORDER — LIDOCAINE 5 % EX PTCH: 1.0000 | MEDICATED_PATCH | CUTANEOUS | Status: DC

## 2012-03-23 MED ORDER — LIDOCAINE 5 % EX PTCH
1.0000 | MEDICATED_PATCH | CUTANEOUS | Status: DC
  Administered 2012-03-23: 1 via TRANSDERMAL
  Filled 2012-03-23: qty 1

## 2012-03-23 NOTE — Discharge Summary (Addendum)
Physician Discharge Summary  [name] YQM:[id number] DOB: 05-05-1924 DOA: 03/20/2012  PCP: [name], MD  Admit date: 03/20/2012 Discharge date: 03/23/2012  Time spent: 35 minutes  Recommendations for Outpatient Follow-up:  1. CBC x 1 week re Hgb 2. Keflex x 7 more days 3. Will need follow up appointment with Dr. Annabell Howells- left message at office 4. Check BS QID- SSI  Discharge Diagnoses:  Active Problems:  Pneumonia  Diabetes mellitus  HTN (hypertension)  Polymyalgia rheumatica  Renal mass  Anemia  Hypoxia  Retroperitoneal bleed  Delirium  CHF (congestive heart failure)   Discharge Condition: improved  Diet recommendation: cardiac/diabetic  Filed Weights   03/20/12 1640  Weight: 80.3 kg (177 lb 0.5 oz)    History of present illness:  [name] is a 79 y.o. female with a history diabetes mellitus, hypertension, COPD, CHF, PMR, and right renal mass status post renal biopsy followed by prior ablation on 1/10 complicated by vomiting postprocedure and admitted for observation stay-discharged on 1/13 who presents with above complaints. She states that today she began having increased pain in her right flank area, also in her shoulders bilaterally, and she felt very weak so she came to the ED. Her son states that she had some confusion. She admits to some shortness of breath but denies cough, fevers, dysuria, melena and no hematochezia. She was seen in the ED and a CT scan of her abdomen and pelvis that showed postprocedure changes in the posterior interpolar right kidney with associated small to moderate retroperitoneal hemorrhage. No evidence of small bowel obstruction. A chest x-ray showed a focal opacity posteriorly at the bases on the lateral view, cannot exclude basilar pneumonia presumably on the right. In ED she was noted to have O2 sats in the ED initially. EDP discussed patient with IR and they recommended admission to hospitalist service for observation. Her  hemoglobin was 9.3, from 9.5 on 1/12.   Hospital Course:  Retroperitoneal bleed  -Possible complication of recent right renal biopsy  -h/h stable -Interventional radiology consult -- Dr. Fredia Sorrow (path c/w oncocytoma)   Hypoxia/probable Pneumonia  -IV abx changed to PO abx  UTI ecoli- has been resistant to flouroquinolones in the past- treated with IV and change to keflex  Delirium  --resolved most like infection related   COPD  -Nebulized bronchodilators, antibiotics as above.  -Supplemental oxygen and follow.   CHF (congestive heart failure)  -mildly overloaded, monitor fluid status continue low-dose Lasix   Diabetes mellitus  -monitor Accu-Cheks, continue Levemir, cover with sliding scale.   HTN (hypertension)  -Continue metoprolol, monitor blood pressures and further treat accordingly.   Polymyalgia rheumatica  -Continue prednisone that she's been on it chronically monitor her. Her blood pressures are stable at this time.   Renal mass  -Status post biopsy followed by cryoablation-per Dr. Fredia Sorrow. Biopsy shows oncocytoma   Anemia  -Stable at this time, Monitor H&H closely given #1, follow and transfuse as appropriate   Procedures:  none  Consultations:  none  Discharge Exam: Filed Vitals:   03/22/12 1458 03/22/12 2007 03/22/12 2246 03/23/12 0530  BP:   127/72 120/78  Pulse:   73 92  Temp:   97.7 F (36.5 C) 97.4 F (36.3 C)  TempSrc:   Oral   Resp:      Height:      Weight:      SpO2: 94% 91% 91% 95%    General: pleasant/ cooperative, NAD Cardiovascular:rrr Respiratory: clear anterior  Discharge Instructions  Discharge Orders    Future Orders Please Complete By Expires   Diet - low sodium heart healthy      Diet Carb Modified      Increase activity slowly      Discharge instructions      Comments:   Will need to follow up with dr. Annabell Howells levaquin x 3 days   Discharge instructions      Comments:   CBC x 1 week         Medication List     As of 03/23/2012 11:16 AM    TAKE these medications         acetaminophen 500 MG tablet   Commonly known as: TYLENOL   Take 1,000 mg by mouth every 6 (six) hours as needed.      acyclovir 200 MG capsule   Commonly known as: ZOVIRAX   Take 200 mg by mouth daily.      amitriptyline 50 MG tablet   Commonly known as: ELAVIL   Take 50 mg by mouth at bedtime.      aspirin EC 325 MG tablet   Take 325 mg by mouth daily.      cephALEXin 500 MG capsule   Commonly known as: KEFLEX   Take 1 capsule (500 mg total) by mouth 4 (four) times daily.      enalapril 5 MG tablet   Commonly known as: VASOTEC   Take 5 mg by mouth every morning.      furosemide 40 MG tablet   Commonly known as: LASIX   Take 1 tablet (40 mg total) by mouth daily.      gabapentin 300 MG capsule   Commonly known as: NEURONTIN   Take 1 capsule (300 mg total) by mouth 3 (three) times daily.      insulin aspart 100 UNIT/ML injection   Commonly known as: novoLOG   Inject 0-15 Units into the skin as needed. Sugar level less than 150 do not take any Novolog, if sugar level is between 151-200 take 3 units, 201-250 take 5 units, 251-300 take 7 units 301-350 take 9 units, 351-400 take 11 units, sugar level over 400 take 15 units and notify docotor      insulin detemir 100 UNIT/ML injection   Commonly known as: LEVEMIR   Inject 30 Units into the skin at bedtime.      ipratropium 0.02 % nebulizer solution   Commonly known as: ATROVENT   Take 2.5 mLs (0.5 mg total) by nebulization every 6 (six) hours as needed for wheezing (for wheezing or increased shortness of breath).      lidocaine 5 %   Commonly known as: LIDODERM   Place 1 patch onto the skin daily. Remove & Discard patch within 12 hours or as directed by MD      metoprolol tartrate 12.5 mg Tabs   Commonly known as: LOPRESSOR   Take 0.5 tablets (12.5 mg total) by mouth 2 (two) times daily.      multivitamins ther. w/minerals Tabs   Take  1 tablet by mouth daily.      naproxen sodium 220 MG tablet   Commonly known as: ANAPROX   Take 220 mg by mouth 2 (two) times daily as needed. For pain      oxybutynin 5 MG tablet   Commonly known as: DITROPAN   Take 5 mg by mouth 3 (three) times daily as needed. For muscle spasms      predniSONE 1 MG tablet  Commonly known as: DELTASONE   Take 3 mg by mouth every morning.      prochlorperazine 5 MG tablet   Commonly known as: COMPAZINE   Take 1 tablet (5 mg total) by mouth every 6 (six) hours as needed for nausea.      traMADol 50 MG tablet   Commonly known as: ULTRAM   Take 1 tablet (50 mg total) by mouth every 8 (eight) hours as needed.         Follow-up Information    Follow up with GREEN, Lenon Curt, MD. In 1 week.   Contact information:   [address] Jeanella [place] [address] [phone]       Follow up with Anner Crete, MD. Schedule an appointment as soon as possible for a visit in 1 week.   Contact information:   [address] [place] [address] [phone]           The results of significant diagnostics from this hospitalization (including imaging, microbiology, ancillary and laboratory) are listed below for reference.    Significant Diagnostic Studies: Ct Abdomen Pelvis Wo Contrast  03/20/2012  *RADIOLOGY REPORT*  Clinical Data: Diffuse abdominal pain, nausea/vomiting  CT ABDOMEN AND PELVIS WITHOUT CONTRAST  Technique:  Multidetector CT imaging of the abdomen and pelvis was performed following the standard protocol without intravenous contrast.  Comparison: Cryoablation CT dated 03/16/2012.  CT abdomen dated 02/01/2012.  Findings: Small right and trace left pleural effusions.  Associated lower lobe opacities, likely atelectasis.  Nodular hepatic contour, suggesting cirrhosis.  Stable calcifications along the hepatic dome.  Spleen is normal in size.  Pancreas and adrenal glands are within normal limits.  Layering gallstones.  No  associated inflammatory changes.  Postprocedure changes in the posterior interpolar right kidney, including tiny foci of gas (series 2/image 22).  Associated small to moderate retroperitoneal hemorrhage, measuring 4.6 x 7.6 cm in axial dimensions.  Left kidney is unremarkable.  No renal calculi or hydronephrosis.  No evidence of bowel obstruction.  Colonic diverticulosis, without associated inflammatory changes.  Atherosclerotic calcifications of the abdominal aorta and branch vessels.  No suspicious abdominopelvic lymphadenopathy.  No abdominopelvic ascites.  Uterus is unremarkable.  No adnexal masses.  Tiny foci of nondependent the bladder.  Degenerative changes of the visualized thoracolumbar spine with lumbar dextroscoliosis.  Mild to moderate compression deformity at T12, unchanged.  Left total hip arthroplasty.  IMPRESSION: Postprocedure changes in the posterior interpolar right kidney with associated small to moderate retroperitoneal hemorrhage.  No evidence of bowel obstruction.  Cholelithiasis, without associated inflammatory changes by CT.  Small right and trace left pleural effusions.  Associated lower lobe opacities, likely atelectasis.   Original Report Authenticated By: Charline Bills, M.D.    Dg Chest 2 View  03/20/2012  *RADIOLOGY REPORT*  Clinical Data: Chest pain.  CHEST - 2 VIEW  Comparison: 03/13/2012  Findings: Heart is borderline in size.  Lingular atelectasis or scarring.  Focal opacity posteriorly on the lateral view.  This is not well visualized on the frontal view but may be in the right lung base.  Cannot exclude right posterior basilar pneumonia.  No effusions.  No acute bony abnormality.  IMPRESSION: Focal opacity posteriorly at the bases on the lateral view.  Cannot exclude basilar pneumonia, presumably on the right.   Original Report Authenticated By: Charlett Nose, M.D.    Dg Chest 2 View  03/13/2012  *RADIOLOGY REPORT*  Clinical Data: Preop for radio frequency ablation.  CHEST -  2 VIEW  Comparison: 05/27/2011.  Findings: The cardiac silhouette, mediastinal and hilar contours are within normal limits and stable.  The lungs are clear.  Minimal basilar scarring changes.  No pleural effusion.  The bony thorax is intact.  Stable compression fracture of L1. Stable surgical changes from a cervical fusion.  IMPRESSION: No acute cardiopulmonary findings.   Original Report Authenticated By: Rudie Meyer, M.D.    Ct Guide Tissue Ablation  03/16/2012  *RADIOLOGY REPORT*  Clinical Data:  2 cm right renal mass.  The patient presents for biopsy and percutaneous cryoablation.  CT-GUIDED CORE BIOPSY OF RIGHT RENAL MASS. CT-GUIDED PERCUTANEOUS CRYOABLATION OF RIGHT RENAL MASS.  Anesthesia:  General  Medications:  1 gram IV Ancef. As antibiotic prophylaxis, Ancef was ordered pre-procedure and administered intravenously within one hour of incision.  Procedure:  The procedure, risks, benefits, and alternatives were explained to the patient.  Questions regarding the procedure were encouraged and answered.  The patient understands and consents to the procedure.  The patient was placed under general anesthesia.  Initial unenhanced CT was performed in a prone position to localize the right renal mass.  The right flank region was prepped with Betadine in a sterile fashion, and a sterile drape was applied covering the operative field.  A sterile gown and sterile gloves were used for the procedure.  A 17 gauge trocar needle was advanced into the right renal mass. Core biopsy was performed with an 18 gauge automated core biopsy device.  A total of 2 core samples were submitted in formalin for pathologic analysis.  Under CT guidance, two separate 1.7 mm Endocare percutaneous cryoablation probes were advanced into the right renal mass.  Probe positioning was confirmed by CT prior to cryoablation.  Cryoablation was performed through the two probes simultaneously. Initial 10 minute cycle of cryoablation was performed.   This was followed by a 8 minute thaw cycle.  A second 8 minute cycle of cryoablation was then performed.  During ablation, periodic CT imaging was performed to monitor ice ball formation and morphology. After active thaw, the cryoablation probes were removed.  Post-procedural CT was performed.  Complications: None  Findings:  Imaging demonstrates an exophytic upper pole posterior right renal mass measuring approximately 2 cm in greatest diameter. Solid tissue was obtained from the lesion.  CT monitoring during cryoablation shows adequate ice ball formation encompassing the region of the tumor.  IMPRESSION:  CT guided percutaneous core biopsy and cryoablation of right renal mass.  The patient will be observed overnight.  Initial follow-up will be performed in approximately 4 weeks.   Original Report Authenticated By: Irish Lack, M.D.    Ct Biopsy  03/16/2012  *RADIOLOGY REPORT*  Clinical Data:  2 cm right renal mass.  The patient presents for biopsy and percutaneous cryoablation.  CT-GUIDED CORE BIOPSY OF RIGHT RENAL MASS. CT-GUIDED PERCUTANEOUS CRYOABLATION OF RIGHT RENAL MASS.  Anesthesia:  General  Medications:  1 gram IV Ancef. As antibiotic prophylaxis, Ancef was ordered pre-procedure and administered intravenously within one hour of incision.  Procedure:  The procedure, risks, benefits, and alternatives were explained to the patient.  Questions regarding the procedure were encouraged and answered.  The patient understands and consents to the procedure.  The patient was placed under general anesthesia.  Initial unenhanced CT was performed in a prone position to localize the right renal mass.  The right flank region was prepped with Betadine in a sterile fashion, and a sterile drape was applied covering the operative field.  A sterile gown and sterile gloves were used for the procedure.  A 17 gauge trocar needle was advanced into the right renal mass. Core biopsy was performed with an 18 gauge automated  core biopsy device.  A total of 2 core samples were submitted in formalin for pathologic analysis.  Under CT guidance, two separate 1.7 mm Endocare percutaneous cryoablation probes were advanced into the right renal mass.  Probe positioning was confirmed by CT prior to cryoablation.  Cryoablation was performed through the two probes simultaneously. Initial 10 minute cycle of cryoablation was performed.  This was followed by a 8 minute thaw cycle.  A second 8 minute cycle of cryoablation was then performed.  During ablation, periodic CT imaging was performed to monitor ice ball formation and morphology. After active thaw, the cryoablation probes were removed.  Post-procedural CT was performed.  Complications: None  Findings:  Imaging demonstrates an exophytic upper pole posterior right renal mass measuring approximately 2 cm in greatest diameter. Solid tissue was obtained from the lesion.  CT monitoring during cryoablation shows adequate ice ball formation encompassing the region of the tumor.  IMPRESSION:  CT guided percutaneous core biopsy and cryoablation of right renal mass.  The patient will be observed overnight.  Initial follow-up will be performed in approximately 4 weeks.   Original Report Authenticated By: Irish Lack, M.D.    Portable Chest 1 View  03/21/2012  *RADIOLOGY REPORT*  Clinical Data: Chest pain, follow-up  PORTABLE CHEST - 1 VIEW  Comparison: Chest x-ray of 03/20/2012  Findings: The lungs are poorly aerated with increasing opacities bilaterally some which represents atelectasis and possible effusions.  An element of pulmonary vascular congestion is a definite consideration.  Mild cardiomegaly is stable.  IMPRESSION: Diminished aeration with basilar atelectasis and question of pulmonary vascular congestion.   Original Report Authenticated By: Dwyane Dee, M.D.    Dg Shoulder Left  03/21/2012  *RADIOLOGY REPORT*  Clinical Data: Left shoulder pain since a fall 3 weeks ago.  LEFT SHOULDER -  2+ VIEW  Comparison: None.  Findings: There is no fracture or dislocation. Minimal spurring on the inferior aspect of the humeral head.  Small calcification adjacent to the inferior rim of the glenoid may be a tiny loose body in the joint.  IMPRESSION: Minimal arthritic changes in the glenohumeral joint.   Original Report Authenticated By: Francene Boyers, M.D.     Microbiology: Recent Results (from the past 240 hour(s))  URINE CULTURE     Status: Normal (Preliminary result)   Collection Time   03/20/12  3:17 PM      Component Value Range Status Comment   Specimen Description URINE, CLEAN CATCH   Final    Special Requests NONE   Final    Culture  Setup Time 03/21/2012 01:30   Final    Colony Count >=100,000 COLONIES/ML   Final    Culture ESCHERICHIA COLI   Final    Report Status PENDING   Incomplete   MRSA PCR SCREENING     Status: Normal   Collection Time   03/20/12  9:20 PM      Component Value Range Status Comment   MRSA by PCR NEGATIVE  NEGATIVE Final      Labs: Basic Metabolic Panel:  Lab 03/23/12 1610 03/21/12 0102 03/20/12 1008 03/18/12 0707 03/17/12 0416  NA 134* 132* 134* 139 140  K 3.5 3.7 4.1 4.1 3.9  CL 95* 92* 95* 97 99  CO2 30 30 30  37* 32  GLUCOSE  137* 107* 110* 108* 178*  BUN 29* 20 23 27* 24*  CREATININE 0.98 0.92 0.86 1.06 1.02  CALCIUM 9.5 8.8 9.3 9.5 9.2  MG -- -- -- -- --  PHOS -- -- -- -- --   Liver Function Tests: No results found for this basename: AST:5,ALT:5,ALKPHOS:5,BILITOT:5,PROT:5,ALBUMIN:5 in the last 168 hours  Lab 03/20/12 1008  LIPASE 10*  AMYLASE --   No results found for this basename: AMMONIA:5 in the last 168 hours CBC:  Lab 03/23/12 0508 03/22/12 0700 03/22/12 0130 03/21/12 1912 03/21/12 1250 03/21/12 0102 03/20/12 1008 03/18/12 0707 03/17/12 0416  WBC 9.1 -- -- -- -- 9.4 9.3 12.9* 9.8  NEUTROABS -- -- -- -- -- -- -- -- --  HGB 8.9* 9.2* 9.4* 9.4* 9.4* -- -- -- --  HCT 27.0* 27.8* 28.6* 28.5* 28.2* -- -- -- --  MCV 93.8 -- -- --  -- 94.7 95.3 97.1 94.6  PLT 238 -- -- -- -- 230 185 228 205   Cardiac Enzymes: No results found for this basename: CKTOTAL:5,CKMB:5,CKMBINDEX:5,TROPONINI:5 in the last 168 hours BNP: BNP (last 3 results)  Basename 03/20/12 1008  PROBNP 1166.0*   CBG:  Lab 03/23/12 0805 03/22/12 2231 03/22/12 1720 03/22/12 1207 03/22/12 0927  GLUCAP 134* 209* 198* 100* 112*     Addendum: patient complained to Caryn Bee allred about neck an head pain- suggest head/neck CT scan but did not order- will order to have done before patient d/cs  Signed:  Jessicah Croll  [facility] 03/23/2012, 11:16 AM

## 2012-03-23 NOTE — Progress Notes (Signed)
Gave report to charge RN, Brion Aliment, at [facility] at [phone]. Left # to call if she has any more questions.

## 2012-03-23 NOTE — Progress Notes (Signed)
Subjective: Pt c/o left neck/shoulder discomfort; no increased back pain  Objective: Vital signs in last 24 hours: Temp:  [97.4 F (36.3 C)-98.1 F (36.7 C)] 97.4 F (36.3 C) (01/17 0530) Pulse Rate:  [73-98] 92  (01/17 0530) Resp:  [20] 20  (01/16 1444) BP: (108-127)/(72-78) 120/78 mmHg (01/17 0530) SpO2:  [91 %-100 %] 95 % (01/17 0530) Last BM Date: 03/22/12  Intake/Output from previous day: 01/16 0701 - 01/17 0700 In: 760 [I.V.:360; IV Piggyback:400] Out: 100 [Urine:100] Intake/Output this shift:  puncture site rt flank unchanged, abd soft, NT/ND; no hematuria  Lab Results:   Basename 03/23/12 0508 03/22/12 0700 03/21/12 0102  WBC 9.1 -- 9.4  HGB 8.9* 9.2* --  HCT 27.0* 27.8* --  PLT 238 -- 230   BMET  Basename 03/23/12 0508 03/21/12 0102  NA 134* 132*  K 3.5 3.7  CL 95* 92*  CO2 30 30  GLUCOSE 137* 107*  BUN 29* 20  CREATININE 0.98 0.92  CALCIUM 9.5 8.8   PT/INR No results found for this basename: LABPROT:2,INR:2 in the last 72 hours ABG No results found for this basename: PHART:2,PCO2:2,PO2:2,HCO3:2 in the last 72 hours Results for orders placed during the hospital encounter of 03/20/12  URINE CULTURE     Status: Normal (Preliminary result)   Collection Time   03/20/12  3:17 PM      Component Value Range Status Comment   Specimen Description URINE, CLEAN CATCH   Final    Special Requests NONE   Final    Culture  Setup Time 03/21/2012 01:30   Final    Colony Count >=100,000 COLONIES/ML   Final    Culture ESCHERICHIA COLI   Final    Report Status PENDING   Incomplete   MRSA PCR SCREENING     Status: Normal   Collection Time   03/20/12  9:20 PM      Component Value Range Status Comment   MRSA by PCR NEGATIVE  NEGATIVE Final    Ct Abdomen Pelvis Wo Contrast  03/20/2012  *RADIOLOGY REPORT*  Clinical Data: Diffuse abdominal pain, nausea/vomiting  CT ABDOMEN AND PELVIS WITHOUT CONTRAST  Technique:  Multidetector CT imaging of the abdomen and pelvis was  performed following the standard protocol without intravenous contrast.  Comparison: Cryoablation CT dated 03/16/2012.  CT abdomen dated 02/01/2012.  Findings: Small right and trace left pleural effusions.  Associated lower lobe opacities, likely atelectasis.  Nodular hepatic contour, suggesting cirrhosis.  Stable calcifications along the hepatic dome.  Spleen is normal in size.  Pancreas and adrenal glands are within normal limits.  Layering gallstones.  No associated inflammatory changes.  Postprocedure changes in the posterior interpolar right kidney, including tiny foci of gas (series 2/image 22).  Associated small to moderate retroperitoneal hemorrhage, measuring 4.6 x 7.6 cm in axial dimensions.  Left kidney is unremarkable.  No renal calculi or hydronephrosis.  No evidence of bowel obstruction.  Colonic diverticulosis, without associated inflammatory changes.  Atherosclerotic calcifications of the abdominal aorta and branch vessels.  No suspicious abdominopelvic lymphadenopathy.  No abdominopelvic ascites.  Uterus is unremarkable.  No adnexal masses.  Tiny foci of nondependent the bladder.  Degenerative changes of the visualized thoracolumbar spine with lumbar dextroscoliosis.  Mild to moderate compression deformity at T12, unchanged.  Left total hip arthroplasty.  IMPRESSION: Postprocedure changes in the posterior interpolar right kidney with associated small to moderate retroperitoneal hemorrhage.  No evidence of bowel obstruction.  Cholelithiasis, without associated inflammatory changes by CT.  Small right  and trace left pleural effusions.  Associated lower lobe opacities, likely atelectasis.   Original Report Authenticated By: Charline Bills, M.D.    Dg Chest 2 View  03/20/2012  *RADIOLOGY REPORT*  Clinical Data: Chest pain.  CHEST - 2 VIEW  Comparison: 03/13/2012  Findings: Heart is borderline in size.  Lingular atelectasis or scarring.  Focal opacity posteriorly on the lateral view.  This is not  well visualized on the frontal view but may be in the right lung base.  Cannot exclude right posterior basilar pneumonia.  No effusions.  No acute bony abnormality.  IMPRESSION: Focal opacity posteriorly at the bases on the lateral view.  Cannot exclude basilar pneumonia, presumably on the right.   Original Report Authenticated By: Charlett Nose, M.D.    Dg Chest 2 View  03/13/2012  *RADIOLOGY REPORT*  Clinical Data: Preop for radio frequency ablation.  CHEST - 2 VIEW  Comparison: 05/27/2011.  Findings: The cardiac silhouette, mediastinal and hilar contours are within normal limits and stable.  The lungs are clear.  Minimal basilar scarring changes.  No pleural effusion.  The bony thorax is intact.  Stable compression fracture of L1. Stable surgical changes from a cervical fusion.  IMPRESSION: No acute cardiopulmonary findings.   Original Report Authenticated By: Rudie Meyer, M.D.    Ct Guide Tissue Ablation  03/16/2012  *RADIOLOGY REPORT*  Clinical Data:  2 cm right renal mass.  The patient presents for biopsy and percutaneous cryoablation.  CT-GUIDED CORE BIOPSY OF RIGHT RENAL MASS. CT-GUIDED PERCUTANEOUS CRYOABLATION OF RIGHT RENAL MASS.  Anesthesia:  General  Medications:  1 gram IV Ancef. As antibiotic prophylaxis, Ancef was ordered pre-procedure and administered intravenously within one hour of incision.  Procedure:  The procedure, risks, benefits, and alternatives were explained to the patient.  Questions regarding the procedure were encouraged and answered.  The patient understands and consents to the procedure.  The patient was placed under general anesthesia.  Initial unenhanced CT was performed in a prone position to localize the right renal mass.  The right flank region was prepped with Betadine in a sterile fashion, and a sterile drape was applied covering the operative field.  A sterile gown and sterile gloves were used for the procedure.  A 17 gauge trocar needle was advanced into the right renal  mass. Core biopsy was performed with an 18 gauge automated core biopsy device.  A total of 2 core samples were submitted in formalin for pathologic analysis.  Under CT guidance, two separate 1.7 mm Endocare percutaneous cryoablation probes were advanced into the right renal mass.  Probe positioning was confirmed by CT prior to cryoablation.  Cryoablation was performed through the two probes simultaneously. Initial 10 minute cycle of cryoablation was performed.  This was followed by a 8 minute thaw cycle.  A second 8 minute cycle of cryoablation was then performed.  During ablation, periodic CT imaging was performed to monitor ice ball formation and morphology. After active thaw, the cryoablation probes were removed.  Post-procedural CT was performed.  Complications: None  Findings:  Imaging demonstrates an exophytic upper pole posterior right renal mass measuring approximately 2 cm in greatest diameter. Solid tissue was obtained from the lesion.  CT monitoring during cryoablation shows adequate ice ball formation encompassing the region of the tumor.  IMPRESSION:  CT guided percutaneous core biopsy and cryoablation of right renal mass.  The patient will be observed overnight.  Initial follow-up will be performed in approximately 4 weeks.   Original Report Authenticated  By: Irish Lack, M.D.    Ct Biopsy  03/16/2012  *RADIOLOGY REPORT*  Clinical Data:  2 cm right renal mass.  The patient presents for biopsy and percutaneous cryoablation.  CT-GUIDED CORE BIOPSY OF RIGHT RENAL MASS. CT-GUIDED PERCUTANEOUS CRYOABLATION OF RIGHT RENAL MASS.  Anesthesia:  General  Medications:  1 gram IV Ancef. As antibiotic prophylaxis, Ancef was ordered pre-procedure and administered intravenously within one hour of incision.  Procedure:  The procedure, risks, benefits, and alternatives were explained to the patient.  Questions regarding the procedure were encouraged and answered.  The patient understands and consents to the  procedure.  The patient was placed under general anesthesia.  Initial unenhanced CT was performed in a prone position to localize the right renal mass.  The right flank region was prepped with Betadine in a sterile fashion, and a sterile drape was applied covering the operative field.  A sterile gown and sterile gloves were used for the procedure.  A 17 gauge trocar needle was advanced into the right renal mass. Core biopsy was performed with an 18 gauge automated core biopsy device.  A total of 2 core samples were submitted in formalin for pathologic analysis.  Under CT guidance, two separate 1.7 mm Endocare percutaneous cryoablation probes were advanced into the right renal mass.  Probe positioning was confirmed by CT prior to cryoablation.  Cryoablation was performed through the two probes simultaneously. Initial 10 minute cycle of cryoablation was performed.  This was followed by a 8 minute thaw cycle.  A second 8 minute cycle of cryoablation was then performed.  During ablation, periodic CT imaging was performed to monitor ice ball formation and morphology. After active thaw, the cryoablation probes were removed.  Post-procedural CT was performed.  Complications: None  Findings:  Imaging demonstrates an exophytic upper pole posterior right renal mass measuring approximately 2 cm in greatest diameter. Solid tissue was obtained from the lesion.  CT monitoring during cryoablation shows adequate ice ball formation encompassing the region of the tumor.  IMPRESSION:  CT guided percutaneous core biopsy and cryoablation of right renal mass.  The patient will be observed overnight.  Initial follow-up will be performed in approximately 4 weeks.   Original Report Authenticated By: Irish Lack, M.D.    Portable Chest 1 View  03/21/2012  *RADIOLOGY REPORT*  Clinical Data: Chest pain, follow-up  PORTABLE CHEST - 1 VIEW  Comparison: Chest x-ray of 03/20/2012  Findings: The lungs are poorly aerated with increasing  opacities bilaterally some which represents atelectasis and possible effusions.  An element of pulmonary vascular congestion is a definite consideration.  Mild cardiomegaly is stable.  IMPRESSION: Diminished aeration with basilar atelectasis and question of pulmonary vascular congestion.   Original Report Authenticated By: Dwyane Dee, M.D.    Dg Shoulder Left  03/21/2012  *RADIOLOGY REPORT*  Clinical Data: Left shoulder pain since a fall 3 weeks ago.  LEFT SHOULDER - 2+ VIEW  Comparison: None.  Findings: There is no fracture or dislocation. Minimal spurring on the inferior aspect of the humeral head.  Small calcification adjacent to the inferior rim of the glenoid may be a tiny loose body in the joint.  IMPRESSION: Minimal arthritic changes in the glenohumeral joint.   Original Report Authenticated By: Francene Boyers, M.D.    Studies/Results: No results found.  Anti-infectives: Anti-infectives     Start     Dose/Rate Route Frequency Ordered Stop   03/23/12 0000   levofloxacin (LEVAQUIN) 750 MG tablet  Status:  Discontinued  750 mg Oral Daily 03/23/12 0925 03/23/12    03/23/12 0000   cephALEXin (KEFLEX) 500 MG capsule        500 mg Oral 4 times daily 03/23/12 0948     03/22/12 0200   vancomycin (VANCOCIN) 1,250 mg in sodium chloride 0.9 % 250 mL IVPB        1,250 mg 166.7 mL/hr over 90 Minutes Intravenous Every 12 hours 03/22/12 1901     03/20/12 2300   piperacillin-tazobactam (ZOSYN) IVPB 3.375 g        3.375 g 12.5 mL/hr over 240 Minutes Intravenous Every 8 hours 03/20/12 1649     03/20/12 1700   vancomycin (VANCOCIN) 750 mg in sodium chloride 0.9 % 150 mL IVPB  Status:  Discontinued        750 mg 150 mL/hr over 60 Minutes Intravenous Every 12 hours 03/20/12 1649 03/22/12 1859   03/20/12 1430   vancomycin (VANCOCIN) IVPB 1000 mg/200 mL premix  Status:  Discontinued        1,000 mg 200 mL/hr over 60 Minutes Intravenous  Once 03/20/12 1402 03/20/12 1638   03/20/12 1430    piperacillin-tazobactam (ZOSYN) IVPB 3.375 g        3.375 g 100 mL/hr over 30 Minutes Intravenous  Once 03/20/12 1402 03/20/12 1535          Assessment/Plan: 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 Pt with hx fall at home prior to ablation procedure but no prior imaging-  consider imaging of head and neck if pain persists; left shoulder film neg for fx (arthritic changes).  LOS: 3 days    [name],D [facility] 03/23/2012

## 2012-03-23 NOTE — Progress Notes (Addendum)
Family chooses [facility].  Notified Tresa Endo, Insurance claims handler at [facility].  Per MD, Pt ready for d/c.  Notified RN, Pt, family and facility.  Sent d/c summary.  Confirmed receipt of d/c summary.  Facility wanting to receive Pt at 1600.  Arranged for transportation to pick Pt up at 1530.  [employer] transportation [place]: [phone].  Provided billing at [facility] with this auth #.  Providence Crosby, LCSWA Clinical Social Work [phone]

## 2012-04-05 ENCOUNTER — Other Ambulatory Visit: Payer: Self-pay | Admitting: Interventional Radiology

## 2012-04-05 DIAGNOSIS — N2889 Other specified disorders of kidney and ureter: Secondary | ICD-10-CM

## 2012-05-09 ENCOUNTER — Other Ambulatory Visit: Payer: Self-pay | Admitting: Interventional Radiology

## 2012-05-09 ENCOUNTER — Ambulatory Visit (HOSPITAL_COMMUNITY)
Admission: RE | Admit: 2012-05-09 | Discharge: 2012-05-09 | Disposition: A | Discharge status: Home / Self Care | Payer: Medicare Other | Source: Ambulatory Visit | Attending: Interventional Radiology | Admitting: Interventional Radiology

## 2012-05-09 ENCOUNTER — Encounter (HOSPITAL_COMMUNITY): Payer: Self-pay

## 2012-05-09 ENCOUNTER — Ambulatory Visit
Admission: RE | Admit: 2012-05-09 | Discharge: 2012-05-09 | Disposition: A | Discharge status: Home / Self Care | Payer: Medicare Other | Source: Ambulatory Visit | Attending: Physician Assistant | Admitting: Physician Assistant

## 2012-05-09 DIAGNOSIS — N2889 Other specified disorders of kidney and ureter: Secondary | ICD-10-CM

## 2012-05-09 DIAGNOSIS — N289 Disorder of kidney and ureter, unspecified: Secondary | ICD-10-CM | POA: Insufficient documentation

## 2012-05-09 DIAGNOSIS — K8020 Calculus of gallbladder without cholecystitis without obstruction: Secondary | ICD-10-CM | POA: Insufficient documentation

## 2012-05-09 MED ORDER — IOHEXOL 300 MG/ML  SOLN
100.0000 mL | Freq: Once | INTRAMUSCULAR | Status: AC | PRN
  Administered 2012-05-09: 100 mL via INTRAVENOUS

## 2012-05-09 NOTE — Progress Notes (Signed)
Denies hematuria or any other urinary problems.  Denies pain associated w/ procedure.    States that she has recently reduced carbos & sweets intake & has lost about 20-24 lbs.  States that she has resumed usual activities post procedure.    Henri [name], Charity fundraiser

## 2012-05-10 NOTE — Progress Notes (Signed)
Patient ID: [name], female   DOB: [birth year], 79 y.o.   MRN: [id number]  ESTABLISHED PATIENT OFFICE VISIT  Chief Complaint: Status post biopsy and percutaneous cryoablation of a right renal mass on 03/16/2012.  History: [name] returns for follow-up. Core biopsy of the 2 cm upper pole right renal mass revealed oncocytoma by pathology. The procedure was technically uncomplicated and the patient was discharged the day after the procedure on 03/17/2012. She was readmitted on 03/20/2012 due to vomiting and right flank pain. Unenhanced CT at that time showed a small to moderate posterior right perinephric retroperitoneal hematoma. The patient did not require transfusion and was discharged home on 03/23/2012. Since that time, the patient denies any pain, hematuria or dysuria. She has resumed normal every day and to use.  Review of Systems: No fever or chills. No right flank pain. No hematuria.  Exam: Vital signs: Blood pressure 123/65, pulse 83, respirations 15, temperature 98, oxygen saturation 94% on room air. General: No acute distress. Abdomen: Soft and nontender. No elicitable right CVA tenderness.  Labs: BUN 47, creatinine 1.36 and estimated GFR 36 ml/minute on 04/23/2012; previous Hospital labs showed BUN 29, creatinine 0.98 and estimated GFR 51 ml/minute on 03/23/2012.  Imaging: Follow-up CT performed today with contrast demonstrates further retraction and decrease in size of the posterior pararenal retroperitoneal hemorrhage on the right. A well-circumscribed ablation defect is present which encompasses the treated tumor with no evidence of abnormal enhancement.  Assessment and Plan: Resolving right retroperitoneal hemorrhage after recent biopsy and percutaneous cryoablation of a right renal oncocytoma. The ablation defect appears adequate on the first follow-up CT to treat the tumor. No other complications are evident. The patient is asymptomatic. There is a  bump in renal function since January. Elevated BUN suggests that this may be largely prerenal and secondary to dehydration. I recommended that the patient try to stay well hydrated with oral fluid intake. She does tend to drink largely coffee during the day. I have recommended another follow-up scan in 4 months. We will recheck renal function at that time.

## 2012-05-14 ENCOUNTER — Ambulatory Visit: Payer: Medicare Other

## 2012-05-14 ENCOUNTER — Ambulatory Visit (INDEPENDENT_AMBULATORY_CARE_PROVIDER_SITE_OTHER): Payer: Medicare Other | Admitting: Family Medicine

## 2012-05-14 VITALS — BP 100/66 | HR 95 | Temp 98.0°F | Resp 16 | Ht 63.0 in | Wt 160.0 lb

## 2012-05-14 DIAGNOSIS — M25552 Pain in left hip: Secondary | ICD-10-CM

## 2012-05-14 DIAGNOSIS — R0789 Other chest pain: Secondary | ICD-10-CM

## 2012-05-14 LAB — POCT URINALYSIS DIPSTICK
Glucose, UA: NEGATIVE
Ketones, UA: 15
Nitrite, UA: NEGATIVE
Protein, UA: 30
Spec Grav, UA: 1.02
Urobilinogen, UA: 1
pH, UA: 5.5

## 2012-05-14 LAB — POCT UA - MICROSCOPIC ONLY
Bacteria, U Microscopic: 2
Casts, Ur, LPF, POC: NEGATIVE
Crystals, Ur, HPF, POC: NEGATIVE
Epithelial cells, urine per micros: NEGATIVE
Mucus, UA: NEGATIVE
Yeast, UA: NEGATIVE

## 2012-05-14 MED ORDER — CEPHALEXIN 500 MG PO TABS: 500.0000 mg | ORAL_TABLET | Freq: Every day | ORAL | Status: DC

## 2012-05-14 MED ORDER — CEPHALEXIN 500 MG PO TABS: 500.0000 mg | ORAL_TABLET | Freq: Three times a day (TID) | ORAL | Status: DC

## 2012-05-14 MED ORDER — TRAMADOL HCL 50 MG PO TABS: 50.0000 mg | ORAL_TABLET | Freq: Three times a day (TID) | ORAL | Status: DC | PRN
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
## 2012-05-17 ENCOUNTER — Encounter (HOSPITAL_COMMUNITY): Payer: Self-pay

## 2012-05-17 ENCOUNTER — Emergency Department (HOSPITAL_COMMUNITY): Payer: Medicare Other

## 2012-05-17 ENCOUNTER — Emergency Department (HOSPITAL_COMMUNITY)
Admission: EM | Admit: 2012-05-17 | Discharge: 2012-05-17 | Disposition: A | Discharge status: Home / Self Care | Payer: Medicare Other | Attending: Emergency Medicine | Admitting: Emergency Medicine

## 2012-05-17 DIAGNOSIS — E119 Type 2 diabetes mellitus without complications: Secondary | ICD-10-CM | POA: Insufficient documentation

## 2012-05-17 DIAGNOSIS — Z87891 Personal history of nicotine dependence: Secondary | ICD-10-CM | POA: Insufficient documentation

## 2012-05-17 DIAGNOSIS — Z8669 Personal history of other diseases of the nervous system and sense organs: Secondary | ICD-10-CM | POA: Insufficient documentation

## 2012-05-17 DIAGNOSIS — M069 Rheumatoid arthritis, unspecified: Secondary | ICD-10-CM | POA: Insufficient documentation

## 2012-05-17 DIAGNOSIS — Z79899 Other long term (current) drug therapy: Secondary | ICD-10-CM | POA: Insufficient documentation

## 2012-05-17 DIAGNOSIS — I10 Essential (primary) hypertension: Secondary | ICD-10-CM | POA: Insufficient documentation

## 2012-05-17 DIAGNOSIS — Z9889 Other specified postprocedural states: Secondary | ICD-10-CM | POA: Insufficient documentation

## 2012-05-17 DIAGNOSIS — M169 Osteoarthritis of hip, unspecified: Secondary | ICD-10-CM | POA: Insufficient documentation

## 2012-05-17 DIAGNOSIS — R000 Tachycardia, unspecified: Secondary | ICD-10-CM | POA: Insufficient documentation

## 2012-05-17 DIAGNOSIS — R5383 Other fatigue: Secondary | ICD-10-CM | POA: Insufficient documentation

## 2012-05-17 DIAGNOSIS — M549 Dorsalgia, unspecified: Secondary | ICD-10-CM | POA: Insufficient documentation

## 2012-05-17 DIAGNOSIS — R5381 Other malaise: Secondary | ICD-10-CM | POA: Insufficient documentation

## 2012-05-17 DIAGNOSIS — Z862 Personal history of diseases of the blood and blood-forming organs and certain disorders involving the immune mechanism: Secondary | ICD-10-CM | POA: Insufficient documentation

## 2012-05-17 DIAGNOSIS — R059 Cough, unspecified: Secondary | ICD-10-CM | POA: Insufficient documentation

## 2012-05-17 DIAGNOSIS — R531 Weakness: Secondary | ICD-10-CM

## 2012-05-17 DIAGNOSIS — J449 Chronic obstructive pulmonary disease, unspecified: Secondary | ICD-10-CM | POA: Insufficient documentation

## 2012-05-17 DIAGNOSIS — R509 Fever, unspecified: Secondary | ICD-10-CM | POA: Insufficient documentation

## 2012-05-17 DIAGNOSIS — H3530 Unspecified macular degeneration: Secondary | ICD-10-CM | POA: Insufficient documentation

## 2012-05-17 DIAGNOSIS — R05 Cough: Secondary | ICD-10-CM | POA: Insufficient documentation

## 2012-05-17 DIAGNOSIS — Z7982 Long term (current) use of aspirin: Secondary | ICD-10-CM | POA: Insufficient documentation

## 2012-05-17 DIAGNOSIS — J4489 Other specified chronic obstructive pulmonary disease: Secondary | ICD-10-CM | POA: Insufficient documentation

## 2012-05-17 DIAGNOSIS — N390 Urinary tract infection, site not specified: Secondary | ICD-10-CM | POA: Insufficient documentation

## 2012-05-17 DIAGNOSIS — W06XXXA Fall from bed, initial encounter: Secondary | ICD-10-CM | POA: Insufficient documentation

## 2012-05-17 DIAGNOSIS — M1610 Unilateral primary osteoarthritis, unspecified hip: Secondary | ICD-10-CM | POA: Insufficient documentation

## 2012-05-17 DIAGNOSIS — Y92009 Unspecified place in unspecified non-institutional (private) residence as the place of occurrence of the external cause: Secondary | ICD-10-CM | POA: Insufficient documentation

## 2012-05-17 DIAGNOSIS — M1710 Unilateral primary osteoarthritis, unspecified knee: Secondary | ICD-10-CM | POA: Insufficient documentation

## 2012-05-17 DIAGNOSIS — Z794 Long term (current) use of insulin: Secondary | ICD-10-CM | POA: Insufficient documentation

## 2012-05-17 DIAGNOSIS — Z8742 Personal history of other diseases of the female genital tract: Secondary | ICD-10-CM | POA: Insufficient documentation

## 2012-05-17 DIAGNOSIS — IMO0002 Reserved for concepts with insufficient information to code with codable children: Secondary | ICD-10-CM | POA: Insufficient documentation

## 2012-05-17 DIAGNOSIS — Z8739 Personal history of other diseases of the musculoskeletal system and connective tissue: Secondary | ICD-10-CM | POA: Insufficient documentation

## 2012-05-17 DIAGNOSIS — I509 Heart failure, unspecified: Secondary | ICD-10-CM | POA: Insufficient documentation

## 2012-05-17 DIAGNOSIS — Z9181 History of falling: Secondary | ICD-10-CM | POA: Insufficient documentation

## 2012-05-17 DIAGNOSIS — Y9389 Activity, other specified: Secondary | ICD-10-CM | POA: Insufficient documentation

## 2012-05-17 DIAGNOSIS — Z8701 Personal history of pneumonia (recurrent): Secondary | ICD-10-CM | POA: Insufficient documentation

## 2012-05-17 DIAGNOSIS — M810 Age-related osteoporosis without current pathological fracture: Secondary | ICD-10-CM | POA: Insufficient documentation

## 2012-05-17 DIAGNOSIS — R631 Polydipsia: Secondary | ICD-10-CM | POA: Insufficient documentation

## 2012-05-17 DIAGNOSIS — Z043 Encounter for examination and observation following other accident: Secondary | ICD-10-CM | POA: Insufficient documentation

## 2012-05-17 LAB — URINE MICROSCOPIC-ADD ON

## 2012-05-17 LAB — CBC WITH DIFFERENTIAL/PLATELET
Eosinophils Relative: 2 % (ref 0–5)
HCT: 36 % (ref 36.0–46.0)
Lymphocytes Relative: 15 % (ref 12–46)
Lymphs Abs: 1.3 10*3/uL (ref 0.7–4.0)
MCV: 94.5 fL (ref 78.0–100.0)
Monocytes Absolute: 1.1 10*3/uL — ABNORMAL HIGH (ref 0.1–1.0)
RBC: 3.81 MIL/uL — ABNORMAL LOW (ref 3.87–5.11)
WBC: 8.5 10*3/uL (ref 4.0–10.5)

## 2012-05-17 LAB — URINALYSIS, ROUTINE W REFLEX MICROSCOPIC
Nitrite: NEGATIVE
Specific Gravity, Urine: 1.021 (ref 1.005–1.030)
Urobilinogen, UA: 1 mg/dL (ref 0.0–1.0)

## 2012-05-17 LAB — COMPREHENSIVE METABOLIC PANEL
BUN: 16 mg/dL (ref 6–23)
CO2: 27 mEq/L (ref 19–32)
Calcium: 9.2 mg/dL (ref 8.4–10.5)
Creatinine, Ser: 0.8 mg/dL (ref 0.50–1.10)
GFR calc Af Amer: 76 mL/min — ABNORMAL LOW (ref >90)
GFR calc non Af Amer: 65 mL/min — ABNORMAL LOW (ref >90)
Glucose, Bld: 153 mg/dL — ABNORMAL HIGH (ref 70–99)

## 2012-05-17 LAB — URINE CULTURE: Colony Count: >=100000

## 2012-05-17 MED ORDER — NITROFURANTOIN MACROCRYSTAL 100 MG PO CAPS: 100.0000 mg | ORAL_CAPSULE | Freq: Two times a day (BID) | ORAL | Status: DC

## 2012-05-17 MED ORDER — CEPHALEXIN 250 MG PO CAPS
250.0000 mg | ORAL_CAPSULE | Freq: Once | ORAL | Status: AC
  Administered 2012-05-17: 250 mg via ORAL
  Filled 2012-05-17: qty 1

## 2012-05-17 MED ORDER — SODIUM CHLORIDE 0.9 % IV BOLUS (SEPSIS)
500.0000 mL | Freq: Once | INTRAVENOUS | Status: AC
  Administered 2012-05-17: 500 mL via INTRAVENOUS

## 2012-05-17 NOTE — ED Provider Notes (Addendum)
History     CSN: [id number]  Arrival date & time 05/17/12  1506   First MD Initiated Contact with Patient 05/17/12 1534      Chief Complaint  Patient presents with  . Fall    (Consider location/radiation/quality/duration/timing/severity/associated sxs/prior treatment) HPI 79 y.o. Female presents today via ambulance.  Patient and son are giving history.  Patient and son live in same home.  Son states weak for for unknown period of time, fell a week ago and seen at urgent care with bruise and pain to right chest and left hip.  States x-rays obtained and told normal.  Increased weakness and continued balance issues.  Patient fell getting out of bed and unable to get up and son found on floor about 1245.  Denies new injuries and states unable to get up on own when falls.  Denies head injury or headache, new neck pain, chest pain, dyspnea,fever,nausea, vomiting, abdominal pain, dysuria, frequency.     Patient has had cough nonproductive for three days. H.O uti.   PMD Art Nyoka Cowden  Patient has not taken insulin for several days because she states she hasn't felt like getting up or giving self shot.  Past Medical History  Diagnosis Date  . Diabetes mellitus   . HTN (hypertension)   . Diabetes mellitus   . Cataract   . Osteoporosis   . 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 Carpal tunnel syndrome of right wrist 02/09/2011  . TIA (transient ischemic attack) 02/09/2011  . Right renal mass   . COPD (chronic obstructive pulmonary disease)     no inhalers used   . Pneumonia dec 2012  . CHF (congestive heart failure)   . Congestive heart failure   . Arthritis     ra  . Degenerative arthritis of cervical spine 02/09/2011  . Degenerative arthritis of hip 02/09/2011  . Degenerative arthritis of knee 02/09/2011  . PONV (postoperative nausea and vomiting)     in past, none recently  . Macular degeneration     both eyes   reviewed Past Surgical History  Procedure  Laterality Date  . Back surgery  6 yrs ago    lower  . Eye surgery  yrs ago    both eyes cataract surgery  . Joint replacement  10 yrs ago    left hip, both knees replaced one 3 yrs ago, one 6 yrs ago  . Upper neck surgery  8 yrs ago  . Right carpal tunnel release  5 yrs go  . Right arm plate  5 yrs ago  . Both elbows surgery  10-12 yrs ago    Family History  Problem Relation Age of Onset  . Leukemia    . Cancer Mother   . Cancer Father   . Cancer Brother   . Cancer Daughter     History  Substance Use Topics  . Smoking status: Former Smoker -- 2.00 packs/day for 30 years    Types: Cigarettes    Quit date: 01/11/1983  . Smokeless tobacco: Never Used  . Alcohol Use: No   reviewed OB History   Grav Para Term Preterm Abortions TAB SAB Ect Mult Living                  Review of Systems  Constitutional: Positive for activity change and fatigue.  HENT: Negative for sore throat.   Eyes: Negative for visual disturbance.  Respiratory: Negative for shortness of breath.   Cardiovascular: Negative for  chest pain.  Gastrointestinal: Negative for abdominal pain and anal bleeding.  Endocrine: Positive for polydipsia.  Genitourinary: Negative for dysuria.  Musculoskeletal: Positive for back pain.  Skin: Negative for rash.  Neurological: Negative for dizziness, weakness and light-headedness.  Hematological: Does not bruise/bleed easily.  Psychiatric/Behavioral: Negative for sleep disturbance.    Allergies  Codeine and Morphine and related  Home Medications   Current Outpatient Rx  Name  Route  Sig  Dispense  Refill  . acetaminophen (TYLENOL) 500 MG tablet   Oral   Take 1,000 mg by mouth every 6 (six) hours as needed.         Marland Kitchen amitriptyline (ELAVIL) 50 MG tablet   Oral   Take 50 mg by mouth at bedtime.         Marland Kitchen aspirin EC 325 MG tablet   Oral   Take 325 mg by mouth daily.           . Cephalexin 500 MG tablet   Oral   Take 1 tablet (500 mg total) by mouth  3 (three) times daily.   21 tablet   0   . Cephalexin 500 MG tablet   Oral   Take 1 tablet (500 mg total) by mouth daily.   30 tablet   2   . enalapril (VASOTEC) 5 MG tablet   Oral   Take 5 mg by mouth every morning.          . furosemide (LASIX) 40 MG tablet   Oral   Take 1 tablet (40 mg total) by mouth daily.   30 tablet      . gabapentin (NEURONTIN) 300 MG capsule   Oral   Take 1 capsule (300 mg total) by mouth 3 (three) times daily.   30 capsule   0   . insulin aspart (NOVOLOG) 100 UNIT/ML injection   Subcutaneous   Inject 0-15 Units into the skin as needed. Sugar level less than 150 do not take any Novolog, if sugar level is between 151-200 take 3 units, 201-250 take 5 units, 251-300 take 7 units 301-350 take 9 units, 351-400 take 11 units, sugar level over 400 take 15 units and notify docotor         . insulin detemir (LEVEMIR) 100 UNIT/ML injection   Subcutaneous   Inject 30 Units into the skin at bedtime.          Marland Kitchen ipratropium (ATROVENT) 0.02 % nebulizer solution   Nebulization   Take 2.5 mLs (0.5 mg total) by nebulization every 6 (six) hours as needed for wheezing (for wheezing or increased shortness of breath).   75 mL   0     Patient needs to follow up with primary MD to cont ...   . metoprolol tartrate (LOPRESSOR) 12.5 mg TABS   Oral   Take 0.5 tablets (12.5 mg total) by mouth 2 (two) times daily.   60 tablet   0   . Multiple Vitamins-Minerals (MULTIVITAMINS THER. W/MINERALS) TABS   Oral   Take 1 tablet by mouth daily.           . naproxen sodium (ANAPROX) 220 MG tablet   Oral   Take 220 mg by mouth 2 (two) times daily as needed. For pain         . oxybutynin (DITROPAN) 5 MG tablet   Oral   Take 5 mg by mouth 3 (three) times daily as needed. For muscle spasms         .  predniSONE (DELTASONE) 1 MG tablet   Oral   Take 3 mg by mouth every morning.         . traMADol (ULTRAM) 50 MG tablet   Oral   Take 1 tablet (50 mg total) by  mouth every 8 (eight) hours as needed.   30 tablet   0     BP 135/76  Pulse 109  Temp(Src) 98.3 F (36.8 C) (Oral)  Ht 5\' 2"  (1.575 m)  Wt 154 lb (69.854 kg)  BMI 28.16 kg/m2  SpO2 95%  Physical Exam  Nursing note and vitals reviewed. Constitutional: She is oriented to person, place, and time. She appears well-developed and well-nourished.  HENT:  Head: Normocephalic and atraumatic.  Right Ear: External ear normal.  Left Ear: External ear normal.  Mm dry  Eyes: Conjunctivae are normal. Pupils are equal, round, and reactive to light.  Neck: Normal range of motion. Neck supple.  Cardiovascular: Normal pulses.  Tachycardia present.   Pulmonary/Chest: Effort normal.  Some rhonchi left base  Abdominal: Soft. Bowel sounds are normal.  Musculoskeletal:  Tender lateral aspect left hip, full arom  Neurological: She is alert and oriented to person, place, and time.  Skin: Skin is warm and dry.  Psychiatric: She has a normal mood and affect. Her behavior is normal. Judgment and thought content normal.    ED Course  Procedures (including critical care time)  Labs Reviewed - No data to display No results found.   No diagnosis found. Results for orders placed during the hospital encounter of 05/17/12  CBC WITH DIFFERENTIAL      Result Value Range   WBC 8.5  4.0 - 10.5 K/uL   RBC 3.81 (*) 3.87 - 5.11 MIL/uL   Hemoglobin 11.7 (*) 12.0 - 15.0 g/dL   HCT 36.0  36.0 - 46.0 %   MCV 94.5  78.0 - 100.0 fL   MCH 30.7  26.0 - 34.0 pg   MCHC 32.5  30.0 - 36.0 g/dL   RDW 15.0  11.5 - 15.5 %   Platelets 159  150 - 400 K/uL   Neutrophils Relative 70  43 - 77 %   Neutro Abs 5.9  1.7 - 7.7 K/uL   Lymphocytes Relative 15  12 - 46 %   Lymphs Abs 1.3  0.7 - 4.0 K/uL   Monocytes Relative 12  3 - 12 %   Monocytes Absolute 1.1 (*) 0.1 - 1.0 K/uL   Eosinophils Relative 2  0 - 5 %   Eosinophils Absolute 0.2  0.0 - 0.7 K/uL   Basophils Relative 0  0 - 1 %   Basophils Absolute 0.0  0.0 - 0.1  K/uL  COMPREHENSIVE METABOLIC PANEL      Result Value Range   Sodium 135  135 - 145 mEq/L   Potassium 4.7  3.5 - 5.1 mEq/L   Chloride 96  96 - 112 mEq/L   CO2 27  19 - 32 mEq/L   Glucose, Bld 153 (*) 70 - 99 mg/dL   BUN 16  6 - 23 mg/dL   Creatinine, Ser 0.80  0.50 - 1.10 mg/dL   Calcium 9.2  8.4 - 10.5 mg/dL   Total Protein 7.1  6.0 - 8.3 g/dL   Albumin 3.1 (*) 3.5 - 5.2 g/dL   AST 27  0 - 37 U/L   ALT 18  0 - 35 U/L   Alkaline Phosphatase 91  39 - 117 U/L   Total Bilirubin 0.6  0.3 - 1.2 mg/dL   GFR calc non Af Amer 65 (*) >90 mL/min   GFR calc Af Amer 76 (*) >90 mL/min  URINALYSIS, ROUTINE W REFLEX MICROSCOPIC      Result Value Range   Color, Urine AMBER (*) YELLOW   APPearance CLOUDY (*) CLEAR   Specific Gravity, Urine 1.021  1.005 - 1.030   pH 5.0  5.0 - 8.0   Glucose, UA NEGATIVE  NEGATIVE mg/dL   Hgb urine dipstick MODERATE (*) NEGATIVE   Bilirubin Urine SMALL (*) NEGATIVE   Ketones, ur >80 (*) NEGATIVE mg/dL   Protein, ur NEGATIVE  NEGATIVE mg/dL   Urobilinogen, UA 1.0  0.0 - 1.0 mg/dL   Nitrite NEGATIVE  NEGATIVE   Leukocytes, UA SMALL (*) NEGATIVE  URINE MICROSCOPIC-ADD ON      Result Value Range   WBC, UA 11-20  <3 WBC/hpf   RBC / HPF 3-6  <3 RBC/hpf   Bacteria, UA FEW (*) RARE   Casts HYALINE CASTS (*) NEGATIVE   Dg Chest 2 View  05/17/2012  *RADIOLOGY REPORT*  Clinical Data: Fall with left hip pain and cough.  CHEST - 2 VIEW  Comparison: 05/14/2012.  Findings: Trachea is midline.  Heart size stable.  Descending thoracic aorta is tortuous.  Scarring or atelectasis at the left lung base.  Lungs are otherwise clear.  No pleural fluid.  Upper lumbar compression fracture and kyphosis are unchanged from 03/20/2012.  There appears to be a mid thoracic compression fracture as well, which appears new.  IMPRESSION:  1.  Left basilar atelectasis or scarring. 2.  Mid thoracic compression fracture appears new from 03/20/2012.   Original Report Authenticated By: Lorin Picket, M.D.    Dg Ribs Unilateral W/chest Left 05/17/2012  *RADIOLOGY REPORT*  Clinical Data: Fall with left hip pain.  LEFT HIP - COMPLETE 2+ VIEW  Comparison: 05/14/2012.  Findings: Left hip arthroplasty without complicating feature. Minimal degenerative change in the right hip.  Obturator rings are intact.  IMPRESSION: Left hip arthroplasty.  No acute findings.   Original Report Authenticated By: Lorin Picket, M.D.    Dg Hip Complete Left  05/15/2012  *RADIOLOGY REPORT*  Clinical Data: History of injury from fall.  History of pain.  LEFT HIP - COMPLETE 2+ VIEW  Comparison: CT 03/20/2012.  Findings: Previous left hip arthroplasty has been performed. Hardware is in place without evidence of disruption.  No fracture dislocation is seen.  Pelvic phleboliths are present.  There is slightly osteopenic appearance of bones.  There is narrowing of the right hip joint space with minimal spurring.  No calcific bursitis is seen.  IMPRESSION: Post left hip arthroplasty.  No disruption of hardware is evident. No fracture or dislocation.  Osteopenic appearance of bones.   Original Report Authenticated By: Shanon Brow Call    Ct Head Wo Contrast  05/17/2012  *RADIOLOGY REPORT*  Clinical Data: Fall, weakness.  CT HEAD WITHOUT CONTRAST  Technique:  Contiguous axial images were obtained from the base of the skull through the vertex without contrast.  Comparison: 03/23/2012  Findings: Mild age related volume loss. No acute intracranial abnormality.  Specifically, no hemorrhage, hydrocephalus, mass lesion, acute infarction, or significant intracranial injury.  No acute calvarial abnormality.  Opacification of the left maxillary sinus is stable.  Orbital soft tissues unremarkable.  IMPRESSION: No acute intracranial abnormality.   Original Report Authenticated By: Rolm Baptise, M.D.    Ct Abd Wo & W Cm  Filed Vitals:   05/17/12 1752  BP: 143/68  Pulse: 99  Temp: 100.6 F (38.1 C)  Resp: 15    MDM  This is an  79 year old female who presents today with increasing weakness. She has had multiple recent falls although she has had ongoing balance issues. She has been seen and evaluated for falls recently. Today she does have a fever which is likely the source of some of her weakness. She has been on Keflex for several days. She will be prescribed Macrodantin      Shaune Pollack, MD 05/17/12 2015  Patient has been up and to the bathroom several times. I discussed need for close followup with her and her son. They both voice understanding.  Shaune Pollack, MD 05/17/12 2015

## 2012-05-17 NOTE — ED Notes (Signed)
Patient transported to radiology.will attempt to obtain blood for labs upon patient's return

## 2012-05-17 NOTE — ED Notes (Signed)
QHQ:[id number]<EK> Expected date:<BR> Expected time:<BR> Means of arrival:<BR> Comments:<BR> 87yoF-multiple falls, orthostatic

## 2012-05-17 NOTE — ED Notes (Signed)
Patient having multiple falls for the last several weeks, patient has recurrent UTI's and has been having blood pressure changes according to EMS who did orthostatics and she was positive.  Patient lives alone but has home health nurse that visits twice weekly.  EMS started line and she has received 253ml NS.  Patient takes lasix so EMS reluctant to give too much fluid. [id number].

## 2012-05-17 NOTE — ED Notes (Signed)
Patient transported to CT 

## 2012-05-17 NOTE — ED Notes (Signed)
Pt sts she has been falling more frequently.  Sts I keep losing my balance.  Sts first fall was December 2013.  Sts chronic back pain.

## 2012-05-17 NOTE — ED Notes (Signed)
Patient is alert and oriented x3.  She was given DC instructions and follow up visit instructions.  Patient gave verbal understanding. She was DC ambulatory under her own power to home.  V/S stable.  He was not showing any signs of distress on DC 

## 2012-05-17 NOTE — ED Notes (Signed)
Patient uses walker for walking but has been having balance problems.  Has been admitted to rehab facility [facility] to work on her balance issues.

## 2012-05-17 NOTE — Progress Notes (Signed)
CSW met with pt and pt son at bedside. Pt lives at home with son who works during the day. Patient has previouslly been to [facility] for short term rehab and discharged home with [facility] care in January. Pt son and pt shared that patient is to weak to return home being by herself during the day. Pt and pt son report patient falling out of bed and being found the floor by pt son. CSW awaiting further evaluation to help determine pt disposition needs.  Dorathy Kinsman, [place]  [phone] .05/17/2012 1625pm

## 2012-05-19 LAB — URINE CULTURE
Culture: NO GROWTH
Special Requests: NORMAL

## 2012-05-30 ENCOUNTER — Ambulatory Visit (INDEPENDENT_AMBULATORY_CARE_PROVIDER_SITE_OTHER): Payer: Medicare Other | Admitting: Family Medicine

## 2012-05-30 ENCOUNTER — Other Ambulatory Visit: Payer: Self-pay | Admitting: *Deleted

## 2012-05-30 VITALS — BP 100/60 | HR 112 | Temp 98.3°F | Resp 18 | Ht 61.0 in | Wt 151.8 lb

## 2012-05-30 DIAGNOSIS — R350 Frequency of micturition: Secondary | ICD-10-CM

## 2012-05-30 DIAGNOSIS — N390 Urinary tract infection, site not specified: Secondary | ICD-10-CM

## 2012-05-30 DIAGNOSIS — I509 Heart failure, unspecified: Secondary | ICD-10-CM

## 2012-05-30 DIAGNOSIS — E119 Type 2 diabetes mellitus without complications: Secondary | ICD-10-CM

## 2012-05-30 DIAGNOSIS — M1990 Unspecified osteoarthritis, unspecified site: Secondary | ICD-10-CM

## 2012-05-30 LAB — POCT URINALYSIS DIPSTICK
Glucose, UA: NEGATIVE
Nitrite, UA: NEGATIVE
Protein, UA: 100
Urobilinogen, UA: 0.2

## 2012-05-30 LAB — POCT CBC
Granulocyte percent: 77.5 %G (ref 37–80)
HCT, POC: 41.4 % (ref 37.7–47.9)
Hemoglobin: 12.8 g/dL (ref 12.2–16.2)
Lymph, poc: 2.5 (ref 0.6–3.4)
MCH, POC: 29.3 pg (ref 27–31.2)
MCHC: 30.9 g/dL — AB (ref 31.8–35.4)
MCV: 94.8 fL (ref 80–97)
MID (cbc): 0.8 (ref 0–0.9)
MPV: 7.9 fL (ref 0–99.8)
POC Granulocyte: 11.5 — AB (ref 2–6.9)
POC LYMPH PERCENT: 17.1 %L (ref 10–50)
POC MID %: 5.4 %M (ref 0–12)
Platelet Count, POC: 285 10*3/uL (ref 142–424)
RBC: 4.37 M/uL (ref 4.04–5.48)
RDW, POC: 16.9 %
WBC: 14.9 10*3/uL — AB (ref 4.6–10.2)

## 2012-05-30 LAB — POCT UA - MICROSCOPIC ONLY
Casts, Ur, LPF, POC: NEGATIVE
Crystals, Ur, HPF, POC: NEGATIVE
Yeast, UA: NEGATIVE

## 2012-05-30 MED ORDER — LEVOFLOXACIN 500 MG PO TABS: 500.0000 mg | ORAL_TABLET | Freq: Every day | ORAL | Status: DC

## 2012-05-30 NOTE — Patient Instructions (Addendum)
Urinary Tract Infection Urinary tract infections (UTIs) can develop anywhere along your urinary tract. Your urinary tract is your body's drainage system for removing wastes and extra water. Your urinary tract includes two kidneys, two ureters, a bladder, and a urethra. Your kidneys are a pair of bean-shaped organs. Each kidney is about the size of your fist. They are located below your ribs, one on each side of your spine. CAUSES Infections are caused by microbes, which are microscopic organisms, including fungi, viruses, and bacteria. These organisms are so small that they can only be seen through a microscope. Bacteria are the microbes that most commonly cause UTIs. SYMPTOMS  Symptoms of UTIs may vary by age and gender of the patient and by the location of the infection. Symptoms in young women typically include a frequent and intense urge to urinate and a painful, burning feeling in the bladder or urethra during urination. Older women and men are more likely to be tired, shaky, and weak and have muscle aches and abdominal pain. A fever may mean the infection is in your kidneys. Other symptoms of a kidney infection include pain in your back or sides below the ribs, nausea, and vomiting. DIAGNOSIS To diagnose a UTI, your caregiver will ask you about your symptoms. Your caregiver also will ask to provide a urine sample. The urine sample will be tested for bacteria and white blood cells. White blood cells are made by your body to help fight infection. TREATMENT  Typically, UTIs can be treated with medication. Because most UTIs are caused by a bacterial infection, they usually can be treated with the use of antibiotics. The choice of antibiotic and length of treatment depend on your symptoms and the type of bacteria causing your infection. HOME CARE INSTRUCTIONS  If you were prescribed antibiotics, take them exactly as your caregiver instructs you. Finish the medication even if you feel better after you  have only taken some of the medication.  Drink enough water and fluids to keep your urine clear or pale yellow.  Avoid caffeine, tea, and carbonated beverages. They tend to irritate your bladder.  Empty your bladder often. Avoid holding urine for long periods of time.  Empty your bladder before and after sexual intercourse.  After a bowel movement, women should cleanse from front to back. Use each tissue only once. SEEK MEDICAL CARE IF:   You have back pain.  You develop a fever.  Your symptoms do not begin to resolve within 3 days. SEEK IMMEDIATE MEDICAL CARE IF:   You have severe back pain or lower abdominal pain.  You develop chills.  You have nausea or vomiting.  You have continued burning or discomfort with urination. MAKE SURE YOU:   Understand these instructions.  Will watch your condition.  Will get help right away if you are not doing well or get worse. Document Released: 12/01/2004 Document Revised: 08/23/2011 Document Reviewed: 04/01/2011 Exit[employer] Patient Information 2013 [employer].  

## 2012-05-30 NOTE — Progress Notes (Signed)
79 yo woman brought in by her son because of progressive weakness x 3 weeks.  Decreased appetite, thirsty quite a bit. No new shortness of breath, no chest pain  C/o back pain, slurred speech (mumbles),  Diffuse weakness, dysuria  She has had similar symptoms with UTI in past  Objective:  NAD, pale Neuro:  No focal weakness, no drift, mumbles, responds appropriately to questions Chest:  Diffuse wet rales both sides Heart: reg, II/VI systolic murmur Ext:  No significant edema  Results for orders placed in visit on 05/30/12  POCT URINALYSIS DIPSTICK      Result Value Range   Color, UA yellow     Clarity, UA hazy     Glucose, UA neg     Bilirubin, UA small     Ketones, UA trace     Spec Grav, UA >=1.030     Blood, UA large     pH, UA 5.0     Protein, UA 100     Urobilinogen, UA 0.2     Nitrite, UA neg     Leukocytes, UA moderate (2+)    POCT UA - MICROSCOPIC ONLY      Result Value Range   WBC, Ur, HPF, POC tntc     RBC, urine, microscopic 5-10     Bacteria, U Microscopic 3+     Mucus, UA neg     Epithelial cells, urine per micros 0-4     Crystals, Ur, HPF, POC neg     Casts, Ur, LPF, POC neg     Yeast, UA neg     Assessment:  UTI complicated by weakness  Plan:  Check urine culture Referral to urologist

## 2012-06-01 LAB — TSH: TSH: 0.979 u[IU]/mL (ref 0.350–4.500)

## 2012-06-01 LAB — COMPREHENSIVE METABOLIC PANEL
ALT: 17 U/L (ref 0–35)
AST: 20 U/L (ref 0–37)
Albumin: 3.9 g/dL (ref 3.5–5.2)
Alkaline Phosphatase: 121 U/L — ABNORMAL HIGH (ref 39–117)
BUN: 49 mg/dL — ABNORMAL HIGH (ref 6–23)
CO2: 26 mEq/L (ref 19–32)
Calcium: 9.5 mg/dL (ref 8.4–10.5)
Chloride: 95 mEq/L — ABNORMAL LOW (ref 96–112)
Creat: 1.89 mg/dL — ABNORMAL HIGH (ref 0.50–1.10)
Glucose, Bld: 194 mg/dL — ABNORMAL HIGH (ref 70–99)
Potassium: 4.6 mEq/L (ref 3.5–5.3)
Sodium: 135 mEq/L (ref 135–145)
Total Bilirubin: 0.4 mg/dL (ref 0.3–1.2)
Total Protein: 7 g/dL (ref 6.0–8.3)

## 2012-06-02 LAB — URINE CULTURE: Colony Count: >=100000

## 2012-06-04 ENCOUNTER — Telehealth: Payer: Self-pay

## 2012-06-04 NOTE — Telephone Encounter (Signed)
Has she heard from urology yet? She is provided the number for [facility] where she has been referred [phone]. She should return here for labs next week, sooner if worse.

## 2012-06-04 NOTE — Telephone Encounter (Signed)
Patients daughter Liborio Nixon is calling to say that [place] is not better would lilke a nurse to call as soon as possible at  [phone]

## 2012-06-06 ENCOUNTER — Telehealth: Payer: Self-pay | Admitting: *Deleted

## 2012-06-06 ENCOUNTER — Encounter (HOSPITAL_COMMUNITY): Payer: Self-pay

## 2012-06-06 ENCOUNTER — Inpatient Hospital Stay (HOSPITAL_COMMUNITY)
Admission: EM | Admit: 2012-06-06 | Discharge: 2012-06-13 | DRG: 871 | Disposition: A | Discharge status: Home Health | Payer: Medicare Other | Attending: Internal Medicine | Admitting: Internal Medicine

## 2012-06-06 DIAGNOSIS — M47812 Spondylosis without myelopathy or radiculopathy, cervical region: Secondary | ICD-10-CM

## 2012-06-06 DIAGNOSIS — M810 Age-related osteoporosis without current pathological fracture: Secondary | ICD-10-CM | POA: Diagnosis present

## 2012-06-06 DIAGNOSIS — H3530 Unspecified macular degeneration: Secondary | ICD-10-CM | POA: Diagnosis present

## 2012-06-06 DIAGNOSIS — E119 Type 2 diabetes mellitus without complications: Secondary | ICD-10-CM

## 2012-06-06 DIAGNOSIS — I959 Hypotension, unspecified: Secondary | ICD-10-CM

## 2012-06-06 DIAGNOSIS — N151 Renal and perinephric abscess: Secondary | ICD-10-CM

## 2012-06-06 DIAGNOSIS — Z79899 Other long term (current) drug therapy: Secondary | ICD-10-CM

## 2012-06-06 DIAGNOSIS — R410 Disorientation, unspecified: Secondary | ICD-10-CM

## 2012-06-06 DIAGNOSIS — N179 Acute kidney failure, unspecified: Secondary | ICD-10-CM

## 2012-06-06 DIAGNOSIS — IMO0002 Reserved for concepts with insufficient information to code with codable children: Secondary | ICD-10-CM

## 2012-06-06 DIAGNOSIS — Z794 Long term (current) use of insulin: Secondary | ICD-10-CM

## 2012-06-06 DIAGNOSIS — D539 Nutritional anemia, unspecified: Secondary | ICD-10-CM | POA: Diagnosis present

## 2012-06-06 DIAGNOSIS — M179 Osteoarthritis of knee, unspecified: Secondary | ICD-10-CM

## 2012-06-06 DIAGNOSIS — I10 Essential (primary) hypertension: Secondary | ICD-10-CM

## 2012-06-06 DIAGNOSIS — J4489 Other specified chronic obstructive pulmonary disease: Secondary | ICD-10-CM | POA: Diagnosis present

## 2012-06-06 DIAGNOSIS — D3000 Benign neoplasm of unspecified kidney: Secondary | ICD-10-CM | POA: Diagnosis present

## 2012-06-06 DIAGNOSIS — M169 Osteoarthritis of hip, unspecified: Secondary | ICD-10-CM

## 2012-06-06 DIAGNOSIS — D708 Other neutropenia: Secondary | ICD-10-CM

## 2012-06-06 DIAGNOSIS — Z8673 Personal history of transient ischemic attack (TIA), and cerebral infarction without residual deficits: Secondary | ICD-10-CM

## 2012-06-06 DIAGNOSIS — Z96649 Presence of unspecified artificial hip joint: Secondary | ICD-10-CM

## 2012-06-06 DIAGNOSIS — E860 Dehydration: Secondary | ICD-10-CM

## 2012-06-06 DIAGNOSIS — I509 Heart failure, unspecified: Secondary | ICD-10-CM

## 2012-06-06 DIAGNOSIS — K6819 Other retroperitoneal abscess: Secondary | ICD-10-CM

## 2012-06-06 DIAGNOSIS — D3001 Benign neoplasm of right kidney: Secondary | ICD-10-CM

## 2012-06-06 DIAGNOSIS — E8770 Fluid overload, unspecified: Secondary | ICD-10-CM

## 2012-06-06 DIAGNOSIS — R58 Hemorrhage, not elsewhere classified: Secondary | ICD-10-CM

## 2012-06-06 DIAGNOSIS — F32A Depression, unspecified: Secondary | ICD-10-CM

## 2012-06-06 DIAGNOSIS — F329 Major depressive disorder, single episode, unspecified: Secondary | ICD-10-CM

## 2012-06-06 DIAGNOSIS — G5601 Carpal tunnel syndrome, right upper limb: Secondary | ICD-10-CM

## 2012-06-06 DIAGNOSIS — J189 Pneumonia, unspecified organism: Secondary | ICD-10-CM

## 2012-06-06 DIAGNOSIS — Z96659 Presence of unspecified artificial knee joint: Secondary | ICD-10-CM

## 2012-06-06 DIAGNOSIS — E871 Hypo-osmolality and hyponatremia: Secondary | ICD-10-CM

## 2012-06-06 DIAGNOSIS — E1165 Type 2 diabetes mellitus with hyperglycemia: Secondary | ICD-10-CM | POA: Diagnosis present

## 2012-06-06 DIAGNOSIS — D649 Anemia, unspecified: Secondary | ICD-10-CM

## 2012-06-06 DIAGNOSIS — A419 Sepsis, unspecified organism: Principal | ICD-10-CM | POA: Diagnosis present

## 2012-06-06 DIAGNOSIS — R627 Adult failure to thrive: Secondary | ICD-10-CM | POA: Diagnosis present

## 2012-06-06 DIAGNOSIS — M1710 Unilateral primary osteoarthritis, unspecified knee: Secondary | ICD-10-CM

## 2012-06-06 DIAGNOSIS — E1129 Type 2 diabetes mellitus with other diabetic kidney complication: Secondary | ICD-10-CM | POA: Diagnosis present

## 2012-06-06 DIAGNOSIS — T8140XA Infection following a procedure, unspecified, initial encounter: Secondary | ICD-10-CM | POA: Diagnosis present

## 2012-06-06 DIAGNOSIS — R0902 Hypoxemia: Secondary | ICD-10-CM

## 2012-06-06 DIAGNOSIS — N390 Urinary tract infection, site not specified: Secondary | ICD-10-CM

## 2012-06-06 DIAGNOSIS — Y838 Other surgical procedures as the cause of abnormal reaction of the patient, or of later complication, without mention of misadventure at the time of the procedure: Secondary | ICD-10-CM | POA: Diagnosis present

## 2012-06-06 DIAGNOSIS — IMO0001 Reserved for inherently not codable concepts without codable children: Secondary | ICD-10-CM | POA: Diagnosis present

## 2012-06-06 DIAGNOSIS — M353 Polymyalgia rheumatica: Secondary | ICD-10-CM

## 2012-06-06 DIAGNOSIS — J449 Chronic obstructive pulmonary disease, unspecified: Secondary | ICD-10-CM | POA: Diagnosis present

## 2012-06-06 DIAGNOSIS — R6510 Systemic inflammatory response syndrome (SIRS) of non-infectious origin without acute organ dysfunction: Secondary | ICD-10-CM

## 2012-06-06 DIAGNOSIS — D693 Immune thrombocytopenic purpura: Secondary | ICD-10-CM

## 2012-06-06 DIAGNOSIS — G459 Transient cerebral ischemic attack, unspecified: Secondary | ICD-10-CM

## 2012-06-06 DIAGNOSIS — Z9181 History of falling: Secondary | ICD-10-CM

## 2012-06-06 LAB — CBC WITH DIFFERENTIAL/PLATELET
Basophils Absolute: 0 10*3/uL (ref 0.0–0.1)
Basophils Relative: 0 % (ref 0–1)
Eosinophils Absolute: 0.1 10*3/uL (ref 0.0–0.7)
Eosinophils Relative: 0 % (ref 0–5)
HCT: 36.3 % (ref 36.0–46.0)
Hemoglobin: 12.2 g/dL (ref 12.0–15.0)
Lymphocytes Relative: 11 % — ABNORMAL LOW (ref 12–46)
Lymphs Abs: 1.2 10*3/uL (ref 0.7–4.0)
MCH: 30.2 pg (ref 26.0–34.0)
MCHC: 33.6 g/dL (ref 30.0–36.0)
MCV: 89.9 fL (ref 78.0–100.0)
Monocytes Absolute: 0.9 10*3/uL (ref 0.1–1.0)
Monocytes Relative: 8 % (ref 3–12)
Neutro Abs: 9.2 10*3/uL — ABNORMAL HIGH (ref 1.7–7.7)
Neutrophils Relative %: 81 % — ABNORMAL HIGH (ref 43–77)
Platelets: 176 10*3/uL (ref 150–400)
RBC: 4.04 MIL/uL (ref 3.87–5.11)
RDW: 14.7 % (ref 11.5–15.5)
WBC: 11.4 10*3/uL — ABNORMAL HIGH (ref 4.0–10.5)

## 2012-06-06 LAB — URINALYSIS, ROUTINE W REFLEX MICROSCOPIC
Bilirubin Urine: NEGATIVE
Glucose, UA: NEGATIVE mg/dL
Ketones, ur: NEGATIVE mg/dL
Nitrite: NEGATIVE
Protein, ur: NEGATIVE mg/dL
Specific Gravity, Urine: 1.024 (ref 1.005–1.030)
Urobilinogen, UA: 0.2 mg/dL (ref 0.0–1.0)
pH: 5 (ref 5.0–8.0)

## 2012-06-06 LAB — GLUCOSE, CAPILLARY: Glucose-Capillary: 157 mg/dL — ABNORMAL HIGH (ref 70–99)

## 2012-06-06 LAB — URINE MICROSCOPIC-ADD ON

## 2012-06-06 LAB — BASIC METABOLIC PANEL
BUN: 42 mg/dL — ABNORMAL HIGH (ref 6–23)
CO2: 24 mEq/L (ref 19–32)
Calcium: 9.4 mg/dL (ref 8.4–10.5)
Chloride: 93 mEq/L — ABNORMAL LOW (ref 96–112)
Creatinine, Ser: 1.48 mg/dL — ABNORMAL HIGH (ref 0.50–1.10)
GFR calc Af Amer: 36 mL/min — ABNORMAL LOW (ref >90)
GFR calc non Af Amer: 31 mL/min — ABNORMAL LOW (ref >90)
Glucose, Bld: 177 mg/dL — ABNORMAL HIGH (ref 70–99)
Potassium: 4.8 mEq/L (ref 3.5–5.1)
Sodium: 132 mEq/L — ABNORMAL LOW (ref 135–145)

## 2012-06-06 LAB — LACTIC ACID, PLASMA: Lactic Acid, Venous: 1 mmol/L (ref 0.5–2.2)

## 2012-06-06 MED ORDER — SODIUM CHLORIDE 0.9 % IV BOLUS (SEPSIS)
500.0000 mL | INTRAVENOUS | Status: AC
  Administered 2012-06-06: 500 mL via INTRAVENOUS

## 2012-06-06 MED ORDER — DEXTROSE 5 % IV SOLN
1.0000 g | Freq: Once | INTRAVENOUS | Status: AC
  Administered 2012-06-06: 1 g via INTRAVENOUS
  Filled 2012-06-06: qty 10

## 2012-06-06 NOTE — ED Notes (Signed)
Son states that she hasn't been eating lately and is very weak and has been falling, pt states that she's really tired.

## 2012-06-06 NOTE — Telephone Encounter (Signed)
Went to Urgent Care due to UTI. Went to Dr. Belva Crome office yesterday (Urologist) and they gave her a shot and placed her on an antibiotic. Daughter is to call the Urologist regarding this problem. Daughter states that patient's blood pressure is low and needs to be seen. Patient is falling and very weak. Not eating or drinking well. Advised daughter to take patient to Urgent Care or ER. Daughter agreed.

## 2012-06-06 NOTE — ED Notes (Signed)
Pt was sent from Dr Belva Crome office for further evaluation of her blood pressure, also family member has a list of current problems for the patient over the last two weeks

## 2012-06-06 NOTE — ED Notes (Signed)
Pt. Made aware for the need of urine. 

## 2012-06-06 NOTE — ED Provider Notes (Signed)
History     CSN: [id number]  Arrival date & time 06/06/12  2011   First MD Initiated Contact with Patient 06/06/12 2109      Chief Complaint  Patient presents with  . Hypotension    (Consider location/radiation/quality/duration/timing/severity/associated sxs/prior treatment) HPI Ms. Bultman is a 79 year old female with a past history of HTN, CHF, DM, COPD, TIA, arthritis, osteoporosis, and right renal mass who presents to the ED with hypotension and weakness. History provided by the patient and her son. She reports her blood pressure was low at her urology doctor's office yesterday. Her daughter called her primary care doctor today and they instructed her to come to the ED. Her son states she has had generalized weakness for the last 3 weeks and has been falling a lot more. She has felt general fatigue for the last couple months and states she has "not been the same" since her cryoablation of her right renal mass. She denies dizziness, lightheadedness, or mechanical falls. She states she just loses her balance. She denies any head injury. She was diagnosed with a UTI about one month ago at an urgent care. She was given a "low dose" ABX 1 month ago, but her symptoms were not resolved and she states yesterday her urologist gave her an injection of ABX and a "high dose" ABX. She denies dysuria but reports increased frequency and urgency. Today she reports feeling lightheaded. She denies any new medications besides the ABX, but states she did not take her enalapril today due to her hypotension yesterday. She denies orthostatic hypotension, fever, chills, cough, SOB, chest pain, nausea, or vomiting. She reports having lost 20 lbs over the last two months since her procedure. She states her appetite has decreased significantly and her son states she doesn't eat much anymore. She has not taken her novolog recently due to decreased food intake.  Past Medical History  Diagnosis Date  . Diabetes mellitus    . HTN (hypertension)   . Diabetes mellitus   . Cataract   . Osteoporosis   . 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 Carpal tunnel syndrome of right wrist 02/09/2011  . TIA (transient ischemic attack) 02/09/2011  . Right renal mass   . COPD (chronic obstructive pulmonary disease)     no inhalers used   . Pneumonia dec 2012  . CHF (congestive heart failure)   . Congestive heart failure   . Arthritis     ra  . Degenerative arthritis of cervical spine 02/09/2011  . Degenerative arthritis of hip 02/09/2011  . Degenerative arthritis of knee 02/09/2011  . PONV (postoperative nausea and vomiting)     in past, none recently  . Macular degeneration     both eyes    Past Surgical History  Procedure Laterality Date  . Back surgery  6 yrs ago    lower  . Eye surgery  yrs ago    both eyes cataract surgery  . Joint replacement  10 yrs ago    left hip, both knees replaced one 3 yrs ago, one 6 yrs ago  . Upper neck surgery  8 yrs ago  . Right carpal tunnel release  5 yrs go  . Right arm plate  5 yrs ago  . Both elbows surgery  10-12 yrs ago    Family History  Problem Relation Age of Onset  . Leukemia    . Cancer Mother   . Cancer Father   . Cancer Brother   .  Cancer Daughter     History  Substance Use Topics  . Smoking status: Former Smoker -- 2.00 packs/day for 30 years    Types: Cigarettes    Quit date: 01/11/1983  . Smokeless tobacco: Never Used  . Alcohol Use: No    OB History   Grav Para Term Preterm Abortions TAB SAB Ect Mult Living                  Review of Systems  Allergies  Codeine and Morphine and related  Home Medications   Current Outpatient Rx  Name  Route  Sig  Dispense  Refill  . amitriptyline (ELAVIL) 50 MG tablet   Oral   Take 50 mg by mouth at bedtime.         Marland Kitchen aspirin EC 325 MG tablet   Oral   Take 325 mg by mouth daily.           . Cephalexin 500 MG tablet   Oral   Take 1 tablet (500 mg total)  by mouth 3 (three) times daily.   21 tablet   0   . enalapril (VASOTEC) 5 MG tablet   Oral   Take 5 mg by mouth every morning.          . furosemide (LASIX) 40 MG tablet   Oral   Take 1 tablet (40 mg total) by mouth daily.   30 tablet      . gabapentin (NEURONTIN) 300 MG capsule   Oral   Take 300 mg by mouth 3 (three) times daily.         . insulin aspart (NOVOLOG) 100 UNIT/ML injection   Subcutaneous   Inject 0-15 Units into the skin as needed. Sugar level less than 150 do not take any Novolog, if sugar level is between 151-200 take 3 units, 201-250 take 5 units, 251-300 take 7 units 301-350 take 9 units, 351-400 take 11 units, sugar level over 400 take 15 units and notify docotor         . insulin detemir (LEVEMIR) 100 UNIT/ML injection   Subcutaneous   Inject 30 Units into the skin at bedtime.          . metoprolol tartrate (LOPRESSOR) 12.5 mg TABS   Oral   Take 12.5 mg by mouth 2 (two) times daily.         . Multiple Vitamins-Minerals (MULTIVITAMINS THER. W/MINERALS) TABS   Oral   Take 1 tablet by mouth daily.           . naproxen sodium (ANAPROX) 220 MG tablet   Oral   Take 220 mg by mouth 2 (two) times daily as needed. For pain         . oxybutynin (DITROPAN) 5 MG tablet   Oral   Take 5 mg by mouth 3 (three) times daily as needed. For muscle spasms         . predniSONE (DELTASONE) 1 MG tablet   Oral   Take 3 mg by mouth every morning.         . traMADol (ULTRAM) 50 MG tablet   Oral   Take 50 mg by mouth every 8 (eight) hours as needed. Pain           BP 82/49  Pulse 99  Temp(Src) 98.1 F (36.7 C) (Oral)  Resp 20  SpO2 95%  Physical Exam  Constitutional: She is oriented to person, place, and time. She appears well-developed.  Frail elderly woman.  HENT:  Head: Normocephalic and atraumatic.  Eyes: EOM are normal. Pupils are equal, round, and reactive to light.  Neck: Normal range of motion. Neck supple.  Cardiovascular: Normal  rate, regular rhythm and intact distal pulses.   Pulses:      Radial pulses are 2+ on the right side.       Dorsalis pedis pulses are 2+ on the right side.  No pedal or lower extremity edema.  Pulmonary/Chest: Effort normal.  Bilateral basilar rales.  Abdominal: Soft. Bowel sounds are normal.  Neurological: She is alert and oriented to person, place, and time. No cranial nerve deficit or sensory deficit.  Patient has generalized weakness. Patient has difficulty pulling herself to sitting position in bed. 4/5 strength in all 4 extremities.  Skin: Skin is warm and dry.    ED Course  Procedures (including critical care time)  Labs Reviewed  GLUCOSE, CAPILLARY - Abnormal; Notable for the following:    Glucose-Capillary 157 (*)    All other components within normal limits  BASIC METABOLIC PANEL - Abnormal; Notable for the following:    Sodium 132 (*)    Chloride 93 (*)    Glucose, Bld 177 (*)    BUN 42 (*)    Creatinine, Ser 1.48 (*)    GFR calc non Af Amer 31 (*)    GFR calc Af Amer 36 (*)    All other components within normal limits  CBC WITH DIFFERENTIAL - Abnormal; Notable for the following:    WBC 11.4 (*)    Neutrophils Relative 81 (*)    Neutro Abs 9.2 (*)    Lymphocytes Relative 11 (*)    All other components within normal limits  URINALYSIS, ROUTINE W REFLEX MICROSCOPIC  LACTIC ACID, PLASMA   Patient be admitted to the hospital for hypotension and dehydration   MDM  CRITICAL CARE Performed by: Carlyle Dolly   Total critical care time: 45 min  Critical care time was exclusive of separately billable procedures and treating other patients.  Critical care was necessary to treat or prevent imminent or life-threatening deterioration.  Critical care was time spent personally by me on the following activities: development of treatment plan with patient and/or surrogate as well as nursing, discussions with consultants, evaluation of patient's response to  treatment, examination of patient, obtaining history from patient or surrogate, ordering and performing treatments and interventions, ordering and review of laboratory studies, ordering and review of radiographic studies, pulse oximetry and re-evaluation of patient's condition.;  Patient was closely monitored due to her low blood pressure here in the emergency department.  Patient seemed to respond well to fluid hydration.  Patient is stable otherwise and her blood pressure has responded well to IV fluids       Carlyle Dolly, PA-C 06/07/12 [phone]

## 2012-06-06 NOTE — ED Notes (Signed)
EKG given to PA, Lawyer.

## 2012-06-07 DIAGNOSIS — N390 Urinary tract infection, site not specified: Secondary | ICD-10-CM

## 2012-06-07 DIAGNOSIS — E119 Type 2 diabetes mellitus without complications: Secondary | ICD-10-CM

## 2012-06-07 DIAGNOSIS — I959 Hypotension, unspecified: Secondary | ICD-10-CM | POA: Diagnosis present

## 2012-06-07 DIAGNOSIS — N179 Acute kidney failure, unspecified: Secondary | ICD-10-CM | POA: Diagnosis present

## 2012-06-07 DIAGNOSIS — R6510 Systemic inflammatory response syndrome (SIRS) of non-infectious origin without acute organ dysfunction: Secondary | ICD-10-CM | POA: Diagnosis present

## 2012-06-07 DIAGNOSIS — E860 Dehydration: Secondary | ICD-10-CM

## 2012-06-07 LAB — GLUCOSE, CAPILLARY
Glucose-Capillary: 100 mg/dL — ABNORMAL HIGH (ref 70–99)
Glucose-Capillary: 124 mg/dL — ABNORMAL HIGH (ref 70–99)
Glucose-Capillary: 219 mg/dL — ABNORMAL HIGH (ref 70–99)
Glucose-Capillary: 147 mg/dL — ABNORMAL HIGH (ref 70–99)

## 2012-06-07 LAB — BASIC METABOLIC PANEL
CO2: 27 mEq/L (ref 19–32)
Calcium: 9 mg/dL (ref 8.4–10.5)
GFR calc Af Amer: 50 mL/min — ABNORMAL LOW (ref >90)
Sodium: 133 mEq/L — ABNORMAL LOW (ref 135–145)

## 2012-06-07 LAB — CBC
MCH: 30.6 pg (ref 26.0–34.0)
Platelets: 136 10*3/uL — ABNORMAL LOW (ref 150–400)
RBC: 3.3 MIL/uL — ABNORMAL LOW (ref 3.87–5.11)
RDW: 14.8 % (ref 11.5–15.5)
WBC: 8.8 10*3/uL (ref 4.0–10.5)

## 2012-06-07 LAB — CORTISOL: Cortisol, Plasma: 8.4 ug/dL

## 2012-06-07 MED ORDER — INSULIN ASPART 100 UNIT/ML ~~LOC~~ SOLN
0.0000 [IU] | Freq: Three times a day (TID) | SUBCUTANEOUS | Status: DC
  Administered 2012-06-07: 5 [IU] via SUBCUTANEOUS
  Administered 2012-06-07 – 2012-06-08 (×2): 2 [IU] via SUBCUTANEOUS
  Administered 2012-06-08: 3 [IU] via SUBCUTANEOUS
  Administered 2012-06-08: 2 [IU] via SUBCUTANEOUS
  Administered 2012-06-09 (×3): 3 [IU] via SUBCUTANEOUS
  Administered 2012-06-10: 5 [IU] via SUBCUTANEOUS
  Administered 2012-06-10: 3 [IU] via SUBCUTANEOUS
  Administered 2012-06-11: 2 [IU] via SUBCUTANEOUS
  Administered 2012-06-11 (×2): 3 [IU] via SUBCUTANEOUS
  Administered 2012-06-12: 5 [IU] via SUBCUTANEOUS
  Administered 2012-06-12: 3 [IU] via SUBCUTANEOUS
  Administered 2012-06-12: 2 [IU] via SUBCUTANEOUS
  Administered 2012-06-13: 3 [IU] via SUBCUTANEOUS
  Administered 2012-06-13: 2 [IU] via SUBCUTANEOUS

## 2012-06-07 MED ORDER — SODIUM CHLORIDE 0.9 % IV BOLUS (SEPSIS)
1000.0000 mL | Freq: Once | INTRAVENOUS | Status: AC
  Administered 2012-06-07: 1000 mL via INTRAVENOUS

## 2012-06-07 MED ORDER — GLUCERNA SHAKE PO LIQD
237.0000 mL | Freq: Two times a day (BID) | ORAL | Status: DC
  Administered 2012-06-07 – 2012-06-13 (×11): 237 mL via ORAL
  Filled 2012-06-07 (×13): qty 237

## 2012-06-07 MED ORDER — ASPIRIN EC 325 MG PO TBEC
325.0000 mg | DELAYED_RELEASE_TABLET | Freq: Every day | ORAL | Status: DC
  Administered 2012-06-07 – 2012-06-13 (×6): 325 mg via ORAL
  Filled 2012-06-07 (×8): qty 1

## 2012-06-07 MED ORDER — OXYBUTYNIN CHLORIDE 5 MG PO TABS
5.0000 mg | ORAL_TABLET | Freq: Three times a day (TID) | ORAL | Status: DC | PRN
  Filled 2012-06-07: qty 1

## 2012-06-07 MED ORDER — DEXTROSE 5 % IV SOLN
1.0000 g | INTRAVENOUS | Status: DC
  Administered 2012-06-07 – 2012-06-12 (×6): 1 g via INTRAVENOUS
  Filled 2012-06-07 (×6): qty 10

## 2012-06-07 MED ORDER — HEPARIN SODIUM (PORCINE) 5000 UNIT/ML IJ SOLN
5000.0000 [IU] | Freq: Three times a day (TID) | INTRAMUSCULAR | Status: DC
  Administered 2012-06-07 – 2012-06-13 (×17): 5000 [IU] via SUBCUTANEOUS
  Filled 2012-06-07 (×23): qty 1

## 2012-06-07 MED ORDER — ACETAMINOPHEN 325 MG PO TABS
650.0000 mg | ORAL_TABLET | Freq: Four times a day (QID) | ORAL | Status: DC | PRN
  Administered 2012-06-07: 650 mg via ORAL
  Filled 2012-06-07 (×2): qty 2

## 2012-06-07 MED ORDER — TRAMADOL HCL 50 MG PO TABS
50.0000 mg | ORAL_TABLET | Freq: Three times a day (TID) | ORAL | Status: DC | PRN
  Administered 2012-06-07 – 2012-06-13 (×9): 50 mg via ORAL
  Filled 2012-06-07: qty 1
  Filled 2012-06-07: qty 12
  Filled 2012-06-07 (×7): qty 1

## 2012-06-07 MED ORDER — GABAPENTIN 300 MG PO CAPS
300.0000 mg | ORAL_CAPSULE | Freq: Three times a day (TID) | ORAL | Status: DC
  Administered 2012-06-07 – 2012-06-13 (×19): 300 mg via ORAL
  Filled 2012-06-07 (×21): qty 1

## 2012-06-07 MED ORDER — SODIUM CHLORIDE 0.9 % IJ SOLN
3.0000 mL | Freq: Two times a day (BID) | INTRAMUSCULAR | Status: DC
  Administered 2012-06-07 – 2012-06-12 (×12): 3 mL via INTRAVENOUS

## 2012-06-07 MED ORDER — AMITRIPTYLINE HCL 50 MG PO TABS
50.0000 mg | ORAL_TABLET | Freq: Every day | ORAL | Status: DC
  Administered 2012-06-07 – 2012-06-12 (×6): 50 mg via ORAL
  Filled 2012-06-07 (×7): qty 1

## 2012-06-07 MED ORDER — PREDNISONE 1 MG PO TABS
3.0000 mg | ORAL_TABLET | Freq: Every morning | ORAL | Status: DC
  Administered 2012-06-07 – 2012-06-13 (×7): 3 mg via ORAL
  Filled 2012-06-07 (×7): qty 3

## 2012-06-07 MED ORDER — INSULIN DETEMIR 100 UNIT/ML ~~LOC~~ SOLN
10.0000 [IU] | Freq: Every day | SUBCUTANEOUS | Status: DC
  Administered 2012-06-07 – 2012-06-12 (×6): 10 [IU] via SUBCUTANEOUS
  Filled 2012-06-07 (×7): qty 0.1

## 2012-06-07 MED ORDER — ADULT MULTIVITAMIN W/MINERALS CH
1.0000 | ORAL_TABLET | Freq: Every day | ORAL | Status: DC
  Administered 2012-06-07 – 2012-06-13 (×7): 1 via ORAL
  Filled 2012-06-07 (×7): qty 1

## 2012-06-07 NOTE — ED Notes (Signed)
Pt waiting bed assignment.  Placement continues to pend.

## 2012-06-07 NOTE — Care Management Note (Addendum)
    Page 1 of 2   06/13/2012     1:35:20 PM   CARE MANAGEMENT NOTE 06/13/2012  Patient:  LILLIONA, BLAKENEY   Account Number:  [id number]  Date Initiated:  06/07/2012  Documentation initiated by:  Lanier Clam  Subjective/Objective Assessment:   ADMITTED W/HYPOTENSION.RECURRENT UTI.     Action/Plan:   FROM HOME W/FAMILY.HAS PCP,PHARMACY.USED [facility] IN PAST.   Anticipated DC Date:  06/13/2012   Anticipated DC Plan:  HOME W HOME HEALTH SERVICES      DC Planning Services  CM consult      Choice offered to / List presented to:  C-1 Patient        HH arranged  HH-1 RN  HH-2 PT  HH-3 OT  HH-4 NURSE'S AIDE      HH agency  [facility]   Status of service:  Completed, signed off Medicare Important Message given?   (If response is "NO", the following Medicare IM given date fields will be blank) Date Medicare IM given:   Date Additional Medicare IM given:    Discharge Disposition:  HOME W HOME HEALTH SERVICES  Per UR Regulation:  Reviewed for med. necessity/level of care/duration of stay  If discussed at Long Length of Stay Meetings, dates discussed:   06/12/2012    Comments:  06/13/12 Hieu Herms RN,BSN NCM [phone] NO IV ABX NEEDED,WILL D/C ON ORAL ABX.[facility] KRISTEN FOLLOWING AWARE OF D/C & HH ORDERS.HHRN-DRAIN CARE/HHPT/OT/NURSE'S AIDE.  06/12/12 Cina Klumpp RN,BSN NCM [phone] SPOKE TO SON MELVIN ABOUT D/C PLANS-PATIENT,& SON AGREE TO HOME W/HH.THEY DECLINE SNF.[facility] KRISTEN,& PAM(IV COORDINATOR) AWARE OF ?HH IV ABX,DRAIN CARE.AWAIT HH RN/PT/OT/AIDE ORDERS IF MD AGREE.WILL NEED SPECIFIC HH ORDERS FOR IV ABX,& DRAIN CARE. 06/10/12 Ziggy Chanthavong RN,BSN NCM [phone] ID CONS.IR FOLLOWING,UROLOGY FOLLOWING.D/C PLAN HOME W/HH-[facility] FOLLOWING.  06/07/12 Whitni Pasquini RN,BSN NCM [phone] [facility] CHOSEN FOR HH IF NEEDED PER PATIENT.AWAIT PT EVAL & RECOMMENDATIONS.TC [facility] KRISTEN(REP) FOLLOWING FOR HH.RECOMMEND HHRN-DISEASE MGMNT.

## 2012-06-07 NOTE — Progress Notes (Addendum)
[facility] PROGRESS NOTE  CARIME DINKEL YQM:[id number] DOB: [birth year] DOA: 06/06/2012 PCP: [name], MD Primary Urologist: Dr. Annabell Howells  Brief narrative URIYAH MASSIMO is a 79 y.o. female with PMH of HTN, right renal mass s/p cryoablation (biopsy demonstrated benign lesion), who presented to the ED on 06/06/12 with hypotension and weakness. Per the patient her BP was low at her urologists office on 4/1 and so her daughter called her PCP who sent her to the ED. She has had generalized weakness for the past 3 weeks associated with falls at home. Additionally since the procedure she has been having recurrent bouts of urinary tract infection with what would appear to be the same E.Coli organism each time (same sensitivities and resistances), this despite multiple courses of Abx therapy.  In the ED patient noted to initially be hypotensive though her BP improved with fluids and she appeared more dry initially than septic. UA again reveals findings consistent with UTI, she was started on rocephin and hospitalist has been asked to admit.   Assessment/Plan: 1. Recurrent UTI: apparently has failed multiple OP regimens. Continue IV Rocephin pending final culture results. Once culture results are available, will discuss with ID regarding duration of antibiotics and? Urinary prophylaxis. Will need to followup outpatient with urology to rule out mechanical causes for recurrent urinary tract infections. 2. SIRS, present on admission: Secondary to UTI. Improved. Continue antibiotics. 3. Hypotension: Secondary to SIRS versus dehydration. Improved. 4. Acute renal failure: Possibly from dehydration. Improving. Follow daily BMP. 5. Type II DM: Continue insulins. Reasonable inpatient control.  6. Anemia and thrombocytopenia: Follow CBC in a.m. 7. Chronic steroid use: Unclear indication. Continue home dose of prednisone.  Code Status:  Full Family Communication:  Discussed with patient and discussed with  patients son Mr. Algis Downs via phone. Disposition Plan:  Home when medically stable   Consultants:   None  Procedures:   None  Antibiotics:   IV Rocephin 4/3 >   HPI/Subjective:  Feels rested and slightly stronger. Denies dysuria, urinary frequency, fever or chills.  Objective: Filed Vitals:   06/07/12 0045 06/07/12 0100 06/07/12 0134 06/07/12 0456  BP:   98/80 119/61  Pulse:   81 78  Temp:    98.2 F (36.8 C)  TempSrc:    Oral  Resp: 14 13 15 16   Height:    5\' 2"  (1.575 m)  Weight:    67.722 kg (149 lb 4.8 oz)  SpO2:   93% 98%    Intake/Output Summary (Last 24 hours) at 06/07/12 0734 Last data filed at 06/06/12 2334  Gross per 24 hour  Intake     50 ml  Output      0 ml  Net     50 ml   Filed Weights   06/07/12 0456  Weight: 67.722 kg (149 lb 4.8 oz)    Exam:   General exam: Comfortable. lying in bed.  Respiratory system: Clear. No increased work of breathing.  Cardiovascular system: S1 & S2 heard, RRR. No JVD, murmurs, gallops, clicks or pedal edema. Telemetry: Sinus rhythm.  Gastrointestinal system: Abdomen is nondistended, soft and nontender. Normal bowel sounds heard.  Central nervous system: Alert and oriented. No focal neurological deficits.  Extremities: Symmetric 5 x 5 power.   Data Reviewed: Basic Metabolic Panel:  Recent Labs Lab 06/06/12 2153 06/07/12 0515  NA 132* 133*  K 4.8 4.3  CL 93* 98  CO2 24 27  GLUCOSE 177* 131*  BUN 42* 35*  CREATININE 1.48* 1.13*  CALCIUM 9.4 9.0   Liver Function Tests: No results found for this basename: AST, ALT, ALKPHOS, BILITOT, PROT, ALBUMIN,  in the last 168 hours No results found for this basename: LIPASE, AMYLASE,  in the last 168 hours No results found for this basename: AMMONIA,  in the last 168 hours CBC:  Recent Labs Lab 06/06/12 2153 06/07/12 0515  WBC 11.4* 8.8  NEUTROABS 9.2*  --   HGB 12.2 10.1*  HCT 36.3 29.9*  MCV 89.9 90.6  PLT 176 136*   Cardiac  Enzymes:  Recent Labs Lab 06/06/12 2153  CKTOTAL 38   BNP (last 3 results)  Recent Labs  03/20/12 1008  PROBNP 1166.0*   CBG:  Recent Labs Lab 06/06/12 2018  GLUCAP 157*    Recent Results (from the past 240 hour(s))  URINE CULTURE     Status: None   Collection Time    05/30/12  6:04 PM      Result Value Range Status   Culture ESCHERICHIA COLI   Final   Colony Count >=100,000 COLONIES/ML   Final   Organism ID, Bacteria ESCHERICHIA COLI   Final     Studies: No results found.   Additional labs:   Scheduled Meds: . amitriptyline  50 mg Oral QHS  . aspirin EC  325 mg Oral Daily  . cefTRIAXone (ROCEPHIN)  IV  1 g Intravenous Q24H  . gabapentin  300 mg Oral TID  . heparin  5,000 Units Subcutaneous Q8H  . insulin aspart  0-15 Units Subcutaneous TID WC  . insulin detemir  10 Units Subcutaneous QHS  . predniSONE  3 mg Oral q morning - 10a  . sodium chloride  3 mL Intravenous Q12H   Continuous Infusions:   Principal Problem:   Recurrent UTI Active Problems:   Diabetes mellitus   Hypotension   AKI (acute kidney injury)    Time spent:  30 minutes    [facility]  [facility] Pager [phone].   If 8PM-8AM, please contact night-coverage at [email or web address], password [email or web address] 06/07/2012, 7:34 AM  LOS: 1 day

## 2012-06-07 NOTE — Progress Notes (Signed)
Was not able to place patient in telemetry bed any earlier due to ADT level of care being "Stepdown". This issue just corrected and patient immediately assigned bed on 4th floor for telemetry. Ginny Forth

## 2012-06-07 NOTE — Progress Notes (Signed)
INITIAL NUTRITION ASSESSMENT  DOCUMENTATION CODES Per approved criteria  -Not Applicable   INTERVENTION: Provide Glucerna shakes BID Provide Multivitamin with minerals daily Encourage po intake  NUTRITION DIAGNOSIS: Unintentional wt loss related to diet changes and decreased appetite as evidenced by 16% wt loss in less than 3 months.  Goal: Pt to meet >/= 90% of their estimated nutrition needs  Monitor:  PO intake Wt Labs  Reason for Assessment: MST  79 y.o. female  Admitting Dx: Recurrent UTI  ASSESSMENT: 79 y.o. female with PMH of HTN, right renal mass s/p cryoablation (biopsy demonstrated benign lesion), who presents to the ED with hypotension and weakness. Per the patient her BP was low at her urologists office yesterday and so her daughter called her PCP who sent her to the ED. She has had generalized weakness for the past 3 weeks associated with falls at home. Pt states that she started following a low carbohydrate diet and stopped eating sweets back in December and she thinks this caused her to lose some weight. Pt also reports having a poor appetite for the past few weeks and has only been eating 2 small meals daily. Pt reports that she will drink Ensure supplements occasionally. Pt denies any difficulty chewing or swallowing. Pt reports eating 75% of her breakfast this morning.  Height: Ht Readings from Last 1 Encounters:  06/07/12 5\' 2"  (1.575 m)    Weight: Wt Readings from Last 1 Encounters:  06/07/12 149 lb 4.8 oz (67.722 kg)    Ideal Body Weight: 110 lbs  % Ideal Body Weight: 135%  Wt Readings from Last 10 Encounters:  06/07/12 149 lb 4.8 oz (67.722 kg)  05/30/12 151 lb 12.8 oz (68.856 kg)  05/17/12 154 lb (69.854 kg)  05/14/12 160 lb (72.576 kg)  05/09/12 158 lb (71.668 kg)  03/20/12 177 lb 0.5 oz (80.3 kg)  03/16/12 177 lb 1.6 oz (80.332 kg)  03/16/12 177 lb 1.6 oz (80.332 kg)  03/13/12 165 lb 4.8 oz (74.98 kg)  03/01/12 167 lb (75.751 kg)     Usual Body Weight: 174 lbs  % Usual Body Weight: 86%  BMI:  Body mass index is 27.3 kg/(m^2).  Estimated Nutritional Needs: Kcal: 1470-1660 Protein: 88-102 grams Fluid: 2.2 L  Skin: WDL  Diet Order: Carb Control  EDUCATION NEEDS: -No education needs identified at this time   Intake/Output Summary (Last 24 hours) at 06/07/12 1001 Last data filed at 06/07/12 0945  Gross per 24 hour  Intake     50 ml  Output    400 ml  Net   -350 ml    Last BM: 4/2  Labs:   Recent Labs Lab 06/06/12 2153 06/07/12 0515  NA 132* 133*  K 4.8 4.3  CL 93* 98  CO2 24 27  BUN 42* 35*  CREATININE 1.48* 1.13*  CALCIUM 9.4 9.0  GLUCOSE 177* 131*    CBG (last 3)   Recent Labs  06/06/12 2018 06/07/12 0726  GLUCAP 157* 100*    Scheduled Meds: . amitriptyline  50 mg Oral QHS  . aspirin EC  325 mg Oral Daily  . cefTRIAXone (ROCEPHIN)  IV  1 g Intravenous Q24H  . gabapentin  300 mg Oral TID  . heparin  5,000 Units Subcutaneous Q8H  . insulin aspart  0-15 Units Subcutaneous TID WC  . insulin detemir  10 Units Subcutaneous QHS  . predniSONE  3 mg Oral q morning - 10a  . sodium chloride  3 mL Intravenous Q12H  Past Medical History  Diagnosis Date  . Diabetes mellitus   . HTN (hypertension)   . Diabetes mellitus   . Cataract   . Osteoporosis   . 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 Carpal tunnel syndrome of right wrist 02/09/2011  . TIA (transient ischemic attack) 02/09/2011  . Right renal mass   . COPD (chronic obstructive pulmonary disease)     no inhalers used   . Pneumonia dec 2012  . CHF (congestive heart failure)   . Congestive heart failure   . Arthritis     ra  . Degenerative arthritis of cervical spine 02/09/2011  . Degenerative arthritis of hip 02/09/2011  . Degenerative arthritis of knee 02/09/2011  . PONV (postoperative nausea and vomiting)     in past, none recently  . Macular degeneration     both eyes     Past Surgical History  Procedure Laterality Date  . Back surgery  6 yrs ago    lower  . Eye surgery  yrs ago    both eyes cataract surgery  . Joint replacement  10 yrs ago    left hip, both knees replaced one 3 yrs ago, one 6 yrs ago  . Upper neck surgery  8 yrs ago  . Right carpal tunnel release  5 yrs go  . Right arm plate  5 yrs ago  . Both elbows surgery  10-12 yrs ago    Ian Malkin RD, LDN Inpatient Clinical Dietitian Pager: [phone] After Hours Pager: [phone]

## 2012-06-07 NOTE — ED Notes (Signed)
RN on unit completing EKG on patient and to call back for report.

## 2012-06-07 NOTE — ED Notes (Signed)
Delay in bed placement d/t changing bed order and complication with request.  Bed now placed.  Attempted to call report.  RN to return call.

## 2012-06-07 NOTE — H&P (Signed)
[facility] History and Physical  [name] ZOX:[id number] DOB: Apr 25, 1924 DOA: 06/06/2012  Referring physician: ED PCP: [name], MD  Specialists: None  Chief Complaint: Hypotension  HPI: [name] is a 79 y.o. female with PMH of HTN, right renal mass s/p cryoablation (biopsy demonstrated benign lesion), who presents to the ED with hypotension and weakness.  Per the patient her BP was low at her urologists office yesterday and so her daughter called her PCP who sent her to the ED.  She has had generalized weakness for the past 3 weeks associated with falls at home.  Additionally since the procedure she has been having recurrent bouts of urinary tract infection with what would appear to be the same E.Coli organism each time (same sensitivities and resistances), this despite multiple courses of Abx therapy.  Today in the ED patient is noted to initially be hypotensive though her BP improves with fluids and she appeared more dry initially than septic.  UA again reveals findings consistent with UTI, she is started on rocephin and hospitalist has been asked to admit.  Review of Systems: 12 systems reviewed and otherwise negative.  Past Medical History  Diagnosis Date  . Diabetes mellitus   . HTN (hypertension)   . Diabetes mellitus   . Cataract   . Osteoporosis   . 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 Carpal tunnel syndrome of right wrist 02/09/2011  . TIA (transient ischemic attack) 02/09/2011  . Right renal mass   . COPD (chronic obstructive pulmonary disease)     no inhalers used   . Pneumonia dec 2012  . CHF (congestive heart failure)   . Congestive heart failure   . Arthritis     ra  . Degenerative arthritis of cervical spine 02/09/2011  . Degenerative arthritis of hip 02/09/2011  . Degenerative arthritis of knee 02/09/2011  . PONV (postoperative nausea and vomiting)     in past, none recently  . Macular degeneration      both eyes   Past Surgical History  Procedure Laterality Date  . Back surgery  6 yrs ago    lower  . Eye surgery  yrs ago    both eyes cataract surgery  . Joint replacement  10 yrs ago    left hip, both knees replaced one 3 yrs ago, one 6 yrs ago  . Upper neck surgery  8 yrs ago  . Right carpal tunnel release  5 yrs go  . Right arm plate  5 yrs ago  . Both elbows surgery  10-12 yrs ago   Social History:  reports that she quit smoking about 29 years ago. Her smoking use included Cigarettes. She has a 60 pack-year smoking history. She has never used smokeless tobacco. She reports that she does not drink alcohol or use illicit drugs.   Allergies  Allergen Reactions  . Codeine Nausea And Vomiting  . Morphine And Related Other (See Comments)    Pt not sure of reaction to morphine    Family History  Problem Relation Age of Onset  . Leukemia    . Cancer Mother   . Cancer Father   . Cancer Brother   . Cancer Daughter     Prior to Admission medications   Medication Sig Start Date End Date Taking? Authorizing Provider  amitriptyline (ELAVIL) 50 MG tablet Take 50 mg by mouth at bedtime. 02/07/11  Yes Kela Millin, MD  aspirin EC 325 MG tablet  Take 325 mg by mouth daily.     Yes Historical Provider, MD  Cephalexin 500 MG tablet Take 1 tablet (500 mg total) by mouth 3 (three) times daily. 05/14/12  Yes Sherren Mocha, MD  enalapril (VASOTEC) 5 MG tablet Take 5 mg by mouth every morning.    Yes Historical Provider, MD  furosemide (LASIX) 40 MG tablet Take 1 tablet (40 mg total) by mouth daily. 03/23/12  Yes Joseph Art, DO  gabapentin (NEURONTIN) 300 MG capsule Take 300 mg by mouth 3 (three) times daily. 02/07/11  Yes Adeline C Viyuoh, MD  insulin aspart (NOVOLOG) 100 UNIT/ML injection Inject 0-15 Units into the skin as needed. Sugar level less than 150 do not take any Novolog, if sugar level is between 151-200 take 3 units, 201-250 take 5 units, 251-300 take 7 units 301-350 take 9 units,  351-400 take 11 units, sugar level over 400 take 15 units and notify docotor 02/07/11  Yes Adeline C Viyuoh, MD  insulin detemir (LEVEMIR) 100 UNIT/ML injection Inject 30 Units into the skin at bedtime.    Yes Historical Provider, MD  metoprolol tartrate (LOPRESSOR) 12.5 mg TABS Take 12.5 mg by mouth 2 (two) times daily. 02/07/11  Yes Kela Millin, MD  Multiple Vitamins-Minerals (MULTIVITAMINS THER. W/MINERALS) TABS Take 1 tablet by mouth daily.     Yes Historical Provider, MD  naproxen sodium (ANAPROX) 220 MG tablet Take 220 mg by mouth 2 (two) times daily as needed. For pain   Yes Historical Provider, MD  oxybutynin (DITROPAN) 5 MG tablet Take 5 mg by mouth 3 (three) times daily as needed. For muscle spasms   Yes Historical Provider, MD  predniSONE (DELTASONE) 1 MG tablet Take 3 mg by mouth every morning. 02/22/11  Yes Adeline Joselyn Glassman, MD  traMADol (ULTRAM) 50 MG tablet Take 50 mg by mouth every 8 (eight) hours as needed. Pain 05/14/12  Yes Sherren Mocha, MD   Physical Exam: Filed Vitals:   06/07/12 0030 06/07/12 0045 06/07/12 0100 06/07/12 0134  BP:    98/80  Pulse:    81  Temp:      TempSrc:      Resp: 14 14 13 15   SpO2:    93%    General:  NAD, resting comfortably in bed, frail, elderly Eyes: PEERLA EOMI ENT: mucous membranes moist Neck: supple w/o JVD Cardiovascular: RRR w/o MRG Respiratory: CTA B Abdomen: soft, nt, nd, bs+ Skin: no rash nor lesion Musculoskeletal: MAE, full ROM all 4 extremities, generalized weakness on exam Psychiatric: normal tone and affect Neurologic: AAOx3, grossly non-focal  Labs on Admission:  Basic Metabolic Panel:  Recent Labs Lab 06/06/12 2153  NA 132*  K 4.8  CL 93*  CO2 24  GLUCOSE 177*  BUN 42*  CREATININE 1.48*  CALCIUM 9.4   Liver Function Tests: No results found for this basename: AST, ALT, ALKPHOS, BILITOT, PROT, ALBUMIN,  in the last 168 hours No results found for this basename: LIPASE, AMYLASE,  in the last 168 hours No  results found for this basename: AMMONIA,  in the last 168 hours CBC:  Recent Labs Lab 06/06/12 2153  WBC 11.4*  NEUTROABS 9.2*  HGB 12.2  HCT 36.3  MCV 89.9  PLT 176   Cardiac Enzymes:  Recent Labs Lab 06/06/12 2153  CKTOTAL 38    BNP (last 3 results)  Recent Labs  03/20/12 1008  PROBNP 1166.0*   CBG:  Recent Labs Lab 06/06/12 2018  GLUCAP 157*  Radiological Exams on Admission: No results found.  EKG: Independently reviewed.  Assessment/Plan Principal Problem:   Recurrent UTI Active Problems:   Diabetes mellitus   Hypotension   AKI (acute kidney injury)   1. Recurrent UTI - has failed multiple attempts at outpatient treatment, cultures are again pending but I am suspicious that if anything grows out, we will again find an E.Coli with a similar resistance profile to the last 3 positive cultures.  It seems likely that rather than new introduction of bacteria the patient is simply failing to clear the bacteria on previous attempts to treat.  Have started rocephin, if indeed the patient comes back with the same E.Coli yet again for the 4th time, may wish to discuss with ID if they have any recommendations to clear this infection. 2. DM2 - have cut back her basal levemir to 10 units a day (from 30 at home), and putting her on a medium SSI. 3. Hypotension - holding all BP meds, pressures improved with IVF, continue to closely monitor, also checking cortisol to see if there may be an element of adrenal insufficiency especially as she is on chronic low dose steroids at home (will of course be continuing the prednisone she is on at the very least). 4. AKI - slight elevation in creatinine 1.48 (0.8), likely pre-renal, recheck in AM, strict intake and output, got 2L of fluid in ED, holding off on further fluids for right now (as she does have history of CHF as well).    Code Status: Full Code (must indicate code status--if unknown or must be presumed, indicate  so) Family Communication: No family in room (indicate person spoken with, if applicable, with phone number if by telephone) Disposition Plan: Admit to inpatient (indicate anticipated LOS)  Time spent: 70 min  Shanie Mauzy M. [facility] Pager [phone]  If 7PM-7AM, please contact night-coverage [email or web address] Password [email or web address] 06/07/2012, 4:18 AM

## 2012-06-07 NOTE — ED Provider Notes (Signed)
[name] is a 79 y.o. female who is here for decreased appetite, and weakness for several days. She came by private vehicle. She lives alone.  Exam alert, calm, cooperative. She is conversant. Mouth moist mucous membranes.  Filed Vitals:   06/06/12 2206 06/06/12 2207 06/06/12 2209 06/06/12 2314  BP: 94/58 84/52 82/49    Pulse: 91 96 99   Temp:    99.7 F (37.6 C)  TempSrc:    Rectal  Resp:      SpO2:       Abdomen is soft  Component     Latest Ref Rng 05/17/2012 05/30/2012 06/06/2012  BUN     6 - 23 mg/dL 16 49 (H) 42 (H)  Creatinine     0.50 - 1.10 mg/dL 2.95  6.21 (H)     Assessment; UTI with hypotension. Doubt sepsis. Suspect dehydration. She is to be admitted for stabilization.  Flint Melter, MD 06/07/12 [phone]

## 2012-06-08 ENCOUNTER — Inpatient Hospital Stay (HOSPITAL_COMMUNITY): Payer: Medicare Other

## 2012-06-08 DIAGNOSIS — A498 Other bacterial infections of unspecified site: Secondary | ICD-10-CM

## 2012-06-08 DIAGNOSIS — R6510 Systemic inflammatory response syndrome (SIRS) of non-infectious origin without acute organ dysfunction: Secondary | ICD-10-CM

## 2012-06-08 LAB — BASIC METABOLIC PANEL
Calcium: 9.1 mg/dL (ref 8.4–10.5)
GFR calc Af Amer: 69 mL/min — ABNORMAL LOW (ref >90)
GFR calc non Af Amer: 60 mL/min — ABNORMAL LOW (ref >90)
Potassium: 4.2 mEq/L (ref 3.5–5.1)
Sodium: 132 mEq/L — ABNORMAL LOW (ref 135–145)

## 2012-06-08 LAB — CBC
HCT: 32.7 % — ABNORMAL LOW (ref 36.0–46.0)
Hemoglobin: 11.2 g/dL — ABNORMAL LOW (ref 12.0–15.0)
MCH: 30.8 pg (ref 26.0–34.0)
MCHC: 34.3 g/dL (ref 30.0–36.0)
MCV: 89.8 fL (ref 78.0–100.0)
Platelets: 129 K/uL — ABNORMAL LOW (ref 150–400)
RBC: 3.64 MIL/uL — ABNORMAL LOW (ref 3.87–5.11)
RDW: 14.6 % (ref 11.5–15.5)
WBC: 9.2 K/uL (ref 4.0–10.5)

## 2012-06-08 LAB — GLUCOSE, CAPILLARY
Glucose-Capillary: 164 mg/dL — ABNORMAL HIGH (ref 70–99)
Glucose-Capillary: 135 mg/dL — ABNORMAL HIGH (ref 70–99)
Glucose-Capillary: 148 mg/dL — ABNORMAL HIGH (ref 70–99)
Glucose-Capillary: 145 mg/dL — ABNORMAL HIGH (ref 70–99)

## 2012-06-08 MED ORDER — IOHEXOL 300 MG/ML  SOLN
100.0000 mL | Freq: Once | INTRAMUSCULAR | Status: AC | PRN
  Administered 2012-06-08: 100 mL via INTRAVENOUS

## 2012-06-08 MED ORDER — IOHEXOL 300 MG/ML  SOLN
50.0000 mL | Freq: Once | INTRAMUSCULAR | Status: AC | PRN
  Administered 2012-06-08: 50 mL via ORAL

## 2012-06-08 NOTE — Progress Notes (Signed)
Patient ID: GILBERTO STRECK, female   DOB: 06-17-1924, 79 y.o.   MRN: [id number]         [facility]    Date of Admission:  06/06/2012           Day 3 ceftriaxone       Reason for Consult: Recurrent urinary tract infections and failure to thrive    Referring Physician: Dr. [name] Scott  Principal Problem:   Recurrent UTI Active Problems:   SIRS (systemic inflammatory response syndrome)   Renal oncocytoma   Retroperitoneal bleed   Hypotension   Diabetes mellitus   AKI (acute kidney injury)   . amitriptyline  50 mg Oral QHS  . aspirin EC  325 mg Oral Daily  . cefTRIAXone (ROCEPHIN)  IV  1 g Intravenous Q24H  . feeding supplement  237 mL Oral BID BM  . gabapentin  300 mg Oral TID  . heparin  5,000 Units Subcutaneous Q8H  . insulin aspart  0-15 Units Subcutaneous TID WC  . insulin detemir  10 Units Subcutaneous QHS  . multivitamin with minerals  1 tablet Oral Daily  . predniSONE  3 mg Oral q morning - 10a  . sodium chloride  3 mL Intravenous Q12H    Recommendations: 1. Continue ceftriaxone 2. Repeat contrasted CT of her abdomen to reevaluate postoperative right retroperitoneal hematoma   Assessment: Ms. Dahlia Client current illness is compatible with a complicated urinary tract infection due to Escherichia coli. I reviewed her recent CT scans with Dr. Fredirick Lathe who agrees with my concern that she might have developed an infection of her postoperative retroperitoneal hematoma. If she has an abscess that would explain why she has had only partial improvement with multiple courses of outpatient oral antibiotic therapy. I will continue ceftriaxone for now and repeat a contrasted CT scan. If she continues to have this complex fluid collection I would ask interventional radiology to aspirate and possibly drain the collection percutaneously and send specimens for routine culture. My partner, Dr. Enedina Finner [phone]), will follow with you this weekend.    HPI:  AVARY EICHENBERGER is a 79 y.o. female with a long history of recurrent urinary tract infections he was found to have a large exophytic mass of the right kidney last year. She underwent biopsy which revealed a benign oncocytoma and then underwent cryoablation of the tumor on January 10 of this year. A CT scan on January 14 revealed an expected degree of postoperative hematoma. She and her family relate that she has been in declining health since the procedure with anorexia, 20 pound unintentional weight loss and recurrent episodes of extreme weakness, falls and dysuria. She has been treated on at least 5 occasions in the last 2 months (nitrofurantoin 04/21/2012, cephalexin 05/14/2012, nitrofurantoin 05/18/2012, levofloxacin 05/30/2012, and cephalexin 06/05/2012) for recurrent urinary tract infections. All recent urine cultures have grown Escherichia coli with the same susceptibility pattern. She notes that she improves as long as she is on antibiotics only to have recurrent symptoms when she stops. The episodes have been getting increasingly more severe. She had one subjective fever about 3 weeks ago. She has fallen at home several times recently and been unable to get up. Her son has arrived home from work on several occasions and found her down on the ground. Over the last few weeks she been having increasing weakness and falls and was found to be hypotensive when she saw Dr. Bjorn Pippin, her urologist, on April 1. A urine  specimen was collected which has grown Escherichia coli again with the same susceptibilities (sensitive to all drugs tested other than ampicillin, quinolone antibiotics, doxycycline and trimethoprim sulfamethoxazole). She was admitted on April 2 with hypotension. She has improved on ceftriaxone.   Review of Systems: Pertinent items are noted in HPI.  Past Medical History  Diagnosis Date  . Diabetes mellitus   . HTN (hypertension)   . Diabetes mellitus   . Cataract   . Osteoporosis   .  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 Carpal tunnel syndrome of right wrist 02/09/2011  . TIA (transient ischemic attack) 02/09/2011  . Right renal mass   . COPD (chronic obstructive pulmonary disease)     no inhalers used   . Pneumonia dec 2012  . CHF (congestive heart failure)   . Congestive heart failure   . Arthritis     ra  . Degenerative arthritis of cervical spine 02/09/2011  . Degenerative arthritis of hip 02/09/2011  . Degenerative arthritis of knee 02/09/2011  . PONV (postoperative nausea and vomiting)     in past, none recently  . Macular degeneration     both eyes    History  Substance Use Topics  . Smoking status: Former Smoker -- 2.00 packs/day for 30 years    Types: Cigarettes    Quit date: 01/11/1983  . Smokeless tobacco: Never Used  . Alcohol Use: No    Family History  Problem Relation Age of Onset  . Leukemia    . Cancer Mother   . Cancer Father   . Cancer Brother   . Cancer Daughter    Allergies  Allergen Reactions  . Codeine Nausea And Vomiting  . Morphine And Related Other (See Comments)    Pt not sure of reaction to morphine    OBJECTIVE: Blood pressure 104/60, pulse 97, temperature 98.3 F (36.8 C), temperature source Oral, resp. rate 18, height 5\' 2"  (1.575 m), weight 67.722 kg (149 lb 4.8 oz), SpO2 93.00%. General: She is alert and comfortable talking with family. Skin: No rash Oral: Edentulous with no lesions Lungs: Few scattered crackles and expiratory wheezes Cor: Very distant heart sounds, no murmur heard Abdomen: Soft and nontender Back: Healed surgical incisions over her cervical spine and lumbar spine. No CVA tenderness  Microbiology: Recent Results (from the past 240 hour(s))  URINE CULTURE     Status: None   Collection Time    05/30/12  6:04 PM      Result Value Range Status   Culture ESCHERICHIA COLI   Final   Colony Count >=100,000 COLONIES/ML   Final   Organism ID, Bacteria ESCHERICHIA  COLI   Final    Cliffton Asters, MD [facility] [phone] pager   [phone] cell 06/08/2012, 3:19 PM

## 2012-06-08 NOTE — Progress Notes (Signed)
Pt's daughter wants practitioner to be aware that pt has had issues with "weak immune system" in the past.  She states that pt has been going through 7 or 8 soaking wet pull-ups per night at home recently.  Thanks.  Berton Bon RN-BC

## 2012-06-08 NOTE — Progress Notes (Signed)
[facility] PROGRESS NOTE  [name] FAO:[id number] DOB: 18-Feb-1925 DOA: 06/06/2012 PCP: [name], MD Primary Urologist: Dr. Annabell Howells  Brief narrative [name] is a 79 y.o. female with PMH of HTN, right renal mass s/p cryoablation (biopsy demonstrated benign lesion), who presented to the ED on 06/06/12 with hypotension and weakness. Per the patient her BP was low at her urologists office on 4/1 and so her daughter called her PCP who sent her to the ED. She has had generalized weakness for the past 3 weeks associated with falls at home. Additionally since the procedure she has been having recurrent bouts of urinary tract infection with what would appear to be the same E.Coli organism each time (same sensitivities and resistances), this despite multiple courses of Abx therapy.  In the ED patient noted to initially be hypotensive though her BP improved with fluids and she appeared more dry initially than septic. UA again reveals findings consistent with UTI, she was started on rocephin and hospitalist has been asked to admit.   Assessment/Plan: 1. Recurrent UTI: apparently has failed multiple OP regimens. Continue IV Rocephin pending final culture results-on further review does not appear that cultures were sent during this admission. Infectious disease consulted on 4/3-input pending. Will need to followup outpatient with urology to rule out mechanical causes for recurrent urinary tract infections. 2. SIRS, present on admission: Secondary to UTI. Improved. Continue antibiotics. 3. Hypotension: Secondary to SIRS versus dehydration. Improved. 4. Acute renal failure: Possibly from dehydration. Resolved. 5. Type II DM: Continue insulins. Reasonable inpatient control.  6. Anemia and thrombocytopenia: Stable 7. Chronic steroid use: Unclear indication. Continue home dose of prednisone.  Code Status:  Full Family Communication:  Discussed with patient. Disposition Plan:  Home when  medically stable   Consultants:   None  Procedures:   None  Antibiotics:   IV Rocephin 4/3 >   HPI/Subjective:  Feels stronger. Ambulated with PT. Complains of some generalized aches.  Objective: Filed Vitals:   06/07/12 1353 06/07/12 2101 06/08/12 0522 06/08/12 1424  BP: 117/60 117/56 116/67 104/60  Pulse: 96 83 93 97  Temp: 98.4 F (36.9 C) 97.9 F (36.6 C) 98.9 F (37.2 C) 98.3 F (36.8 C)  TempSrc: Oral Oral Oral Oral  Resp: 18 18 18 18   Height:      Weight:      SpO2: 94% 98% 96% 93%    Intake/Output Summary (Last 24 hours) at 06/08/12 1514 Last data filed at 06/08/12 1334  Gross per 24 hour  Intake    510 ml  Output   1500 ml  Net   -990 ml   Filed Weights   06/07/12 0456  Weight: 67.722 kg (149 lb 4.8 oz)    Exam:   General exam: Comfortable. Seen ambulating with PT.  Respiratory system: Clear. No increased work of breathing.  Cardiovascular system: S1 & S2 heard, RRR. No JVD, murmurs, gallops, clicks or pedal edema. Telemetry: Sinus rhythm.  Gastrointestinal system: Abdomen is nondistended, soft and nontender. Normal bowel sounds heard.  Central nervous system: Alert and oriented. No focal neurological deficits.  Extremities: Symmetric 5 x 5 power.   Data Reviewed: Basic Metabolic Panel:  Recent Labs Lab 06/06/12 2153 06/07/12 0515 06/08/12 0425  NA 132* 133* 132*  K 4.8 4.3 4.2  CL 93* 98 97  CO2 24 27 29   GLUCOSE 177* 131* 170*  BUN 42* 35* 24*  CREATININE 1.48* 1.13* 0.86  CALCIUM 9.4 9.0 9.1   Liver  Function Tests: No results found for this basename: AST, ALT, ALKPHOS, BILITOT, PROT, ALBUMIN,  in the last 168 hours No results found for this basename: LIPASE, AMYLASE,  in the last 168 hours No results found for this basename: AMMONIA,  in the last 168 hours CBC:  Recent Labs Lab 06/06/12 2153 06/07/12 0515 06/08/12 0425  WBC 11.4* 8.8 9.2  NEUTROABS 9.2*  --   --   HGB 12.2 10.1* 11.2*  HCT 36.3 29.9* 32.7*  MCV  89.9 90.6 89.8  PLT 176 136* 129*   Cardiac Enzymes:  Recent Labs Lab 06/06/12 2153  CKTOTAL 38   BNP (last 3 results)  Recent Labs  03/20/12 1008  PROBNP 1166.0*   CBG:  Recent Labs Lab 06/07/12 1136 06/07/12 1657 06/07/12 2106 06/08/12 0747 06/08/12 1146  GLUCAP 124* 219* 147* 164* 135*    Recent Results (from the past 240 hour(s))  URINE CULTURE     Status: None   Collection Time    05/30/12  6:04 PM      Result Value Range Status   Culture ESCHERICHIA COLI   Final   Colony Count >=100,000 COLONIES/ML   Final   Organism ID, Bacteria ESCHERICHIA COLI   Final     Studies: No results found.   Additional labs:   Scheduled Meds: . amitriptyline  50 mg Oral QHS  . aspirin EC  325 mg Oral Daily  . cefTRIAXone (ROCEPHIN)  IV  1 g Intravenous Q24H  . feeding supplement  237 mL Oral BID BM  . gabapentin  300 mg Oral TID  . heparin  5,000 Units Subcutaneous Q8H  . insulin aspart  0-15 Units Subcutaneous TID WC  . insulin detemir  10 Units Subcutaneous QHS  . multivitamin with minerals  1 tablet Oral Daily  . predniSONE  3 mg Oral q morning - 10a  . sodium chloride  3 mL Intravenous Q12H   Continuous Infusions:   Principal Problem:   Recurrent UTI Active Problems:   Diabetes mellitus   Hypotension   AKI (acute kidney injury)   SIRS (systemic inflammatory response syndrome)    Time spent:  30 minutes    [facility]  [facility] Pager [phone].   If 8PM-8AM, please contact night-coverage at [email or web address], password [facility] 06/08/2012, 3:14 PM  LOS: 2 days

## 2012-06-08 NOTE — Evaluation (Signed)
Physical Therapy Evaluation Patient Details Name: [name] MRN: [id number] DOB: 1924/04/30 Today's Date: 06/08/2012 Time: 4098-1191 PT Time Calculation (min): 11 min  PT Assessment / Plan / Recommendation Clinical Impression  Pt admitted for recurrent UTI.  Pt would benefit from acute PT services in order to improve independence with transfers and ambulation prior to d/c home with son.  Pt reports son is at work half of the day. Pt will likely progress home with HHPT.    PT Assessment  Patient needs continued PT services    Follow Up Recommendations  Home health PT;Supervision for mobility/OOB    Does the patient have the potential to tolerate intense rehabilitation      Barriers to Discharge        Equipment Recommendations       Recommendations for Other Services     Frequency Min 3X/week    Precautions / Restrictions Precautions Precautions: Fall Restrictions Weight Bearing Restrictions: No   Pertinent Vitals/Pain n/a      Mobility  Bed Mobility Bed Mobility: Supine to Sit;Sitting - Scoot to Edge of Bed Supine to Sit: 4: Min assist;HOB elevated Sitting - Scoot to [place] of Bed: 3: Mod assist Details for Bed Mobility Assistance: verbal cues for technique, assist for trunk upright, use of bed pad to scoot hips to EOB Transfers Transfers: Sit to Stand;Stand to Sit;Stand Pivot Transfers Sit to Stand: 4: Min assist;From bed;From chair/3-in-1 Stand to Sit: 4: Min assist;To chair/3-in-1 Stand Pivot Transfers: 4: Min assist Details for Transfer Assistance: verbal cues for safe technique including taking RW back to recliner Ambulation/Gait Ambulation/Gait Assistance: 4: Min guard Ambulation Distance (Feet): 80 Feet Assistive device: Rolling walker Ambulation/Gait Assistance Details: verbal cues for posture Gait Pattern: Step-through pattern;Decreased stride length;Trunk flexed Gait velocity: decreased    Exercises     PT Diagnosis: Difficulty walking  PT  Problem List: Decreased activity tolerance;Decreased mobility;Decreased strength;Decreased knowledge of use of DME PT Treatment Interventions: DME instruction;Gait training;Stair training;Functional mobility training;Therapeutic activities;Therapeutic exercise;Patient/family education   PT Goals Acute Rehab PT Goals PT Goal Formulation: With patient Time For Goal Achievement: 06/15/12 Potential to Achieve Goals: Good Pt will go Supine/Side to Sit: with supervision PT Goal: Supine/Side to Sit - Progress: Goal set today Pt will go Sit to Supine/Side: with supervision PT Goal: Sit to Supine/Side - Progress: Goal set today Pt will go Sit to Stand: with supervision PT Goal: Sit to Stand - Progress: Goal set today Pt will go Stand to Sit: with supervision PT Goal: Stand to Sit - Progress: Goal set today Pt will Ambulate: 51 - 150 feet;with supervision;with rolling walker PT Goal: Ambulate - Progress: Goal set today Pt will Perform Home Exercise Program: with supervision, verbal cues required/provided PT Goal: Perform Home Exercise Program - Progress: Goal set today  Visit Information  Last PT Received On: 06/08/12 Assistance Needed: +1    Subjective Data  Subjective: It hurts all over.   Prior Functioning  Home Living Lives With: Son Available Help at Discharge: Available PRN/intermittently Type of Home: House Home Access: Stairs to enter [employer] of Steps: 4 Home Layout: One level Home Adaptive Equipment: Dan Humphreys - four wheeled;Walker - rolling Prior Function Level of Independence: Independent with assistive device(s) Comments: however pt states son helps her if she needs assist but she is normally independent Communication Communication: No difficulties    Cognition  Cognition Overall Cognitive Status: Appears within functional limits for tasks assessed/performed Arousal/Alertness: Awake/alert Orientation Level: Appears intact for tasks assessed Behavior  During Session: [facility] for tasks performed    Extremity/Trunk Assessment Right Upper Extremity Assessment RUE ROM/Strength/Tone: [facility] for tasks assessed Left Upper Extremity Assessment LUE ROM/Strength/Tone: [facility] for tasks assessed Right Lower Extremity Assessment RLE ROM/Strength/Tone: [facility] for tasks assessed Left Lower Extremity Assessment LLE ROM/Strength/Tone: [facility] for tasks assessed   Balance    End of Session PT - End of Session Equipment Utilized During Treatment: Gait belt Activity Tolerance: Patient tolerated treatment well Patient left: in chair;with call bell/phone within reach Nurse Communication: Mobility status (RN aware pt up in chair)  GP     Shakiyah Cirilo,KATHrine E 06/08/2012, 11:33 AM Zenovia Jarred, PT, DPT 06/08/2012 Pager: [phone]

## 2012-06-09 DIAGNOSIS — T8140XA Infection following a procedure, unspecified, initial encounter: Secondary | ICD-10-CM

## 2012-06-09 DIAGNOSIS — Y838 Other surgical procedures as the cause of abnormal reaction of the patient, or of later complication, without mention of misadventure at the time of the procedure: Secondary | ICD-10-CM

## 2012-06-09 LAB — BASIC METABOLIC PANEL
GFR calc Af Amer: 75 mL/min — ABNORMAL LOW (ref >90)
GFR calc non Af Amer: 64 mL/min — ABNORMAL LOW (ref >90)
Glucose, Bld: 126 mg/dL — ABNORMAL HIGH (ref 70–99)
Potassium: 4.1 mEq/L (ref 3.5–5.1)
Sodium: 133 mEq/L — ABNORMAL LOW (ref 135–145)

## 2012-06-09 LAB — CBC
Hemoglobin: 10.7 g/dL — ABNORMAL LOW (ref 12.0–15.0)
MCHC: 33.6 g/dL (ref 30.0–36.0)
Platelets: 122 10*3/uL — ABNORMAL LOW (ref 150–400)
RDW: 14.7 % (ref 11.5–15.5)

## 2012-06-09 LAB — GLUCOSE, CAPILLARY: Glucose-Capillary: 187 mg/dL — ABNORMAL HIGH (ref 70–99)

## 2012-06-09 NOTE — Progress Notes (Signed)
Patient ID: [name], female   DOB: [birth year], 79 y.o.   MRN: [id number] Request received for CT guided aspiration/possible drainage of a right perinephric hematoma/abscess on pt familiar to IR service from prior cryoablation of a right renal oncocytoma. Pt has had recurrent UTI's with hypotension/ weakness/dysuria, multiple falls at home and evidence of liquefying hematoma on recent CT scans now worrisome for abscess. Imaging studies were reviewed by Dr. Deanne Coffer. Additional PMH as below. Exam: pt awake/alert; chest- few scatt exp wheezes, crackles at bases; heart- RRR; abd- soft,+BS,NT; ext- FROM, no edema.   Filed Vitals:   06/08/12 0522 06/08/12 1424 06/08/12 2104 06/09/12 0510  BP: 116/67 104/60 112/64 110/70  Pulse: 93 97 87 89  Temp: 98.9 F (37.2 C) 98.3 F (36.8 C) 98.5 F (36.9 C) 98.6 F (37 C)  TempSrc: Oral Oral Oral Oral  Resp: 18 18 18 18   Height:      Weight:      SpO2: 96% 93% 96% 99%   Past Medical History  Diagnosis Date  . Diabetes mellitus   . HTN (hypertension)   . Diabetes mellitus   . Cataract   . Osteoporosis   . 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 Carpal tunnel syndrome of right wrist 02/09/2011  . TIA (transient ischemic attack) 02/09/2011  . Right renal mass   . COPD (chronic obstructive pulmonary disease)     no inhalers used   . Pneumonia dec 2012  . CHF (congestive heart failure)   . Congestive heart failure   . Arthritis     ra  . Degenerative arthritis of cervical spine 02/09/2011  . Degenerative arthritis of hip 02/09/2011  . Degenerative arthritis of knee 02/09/2011  . PONV (postoperative nausea and vomiting)     in past, none recently  . Macular degeneration     both eyes   Past Surgical History  Procedure Laterality Date  . Back surgery  6 yrs ago    lower  . Eye surgery  yrs ago    both eyes cataract surgery  . Joint replacement  10 yrs ago    left hip, both knees replaced one 3 yrs ago, one  6 yrs ago  . Upper neck surgery  8 yrs ago  . Right carpal tunnel release  5 yrs go  . Right arm plate  5 yrs ago  . Both elbows surgery  10-12 yrs ago   Dg Chest 2 View  05/17/2012  *RADIOLOGY REPORT*  Clinical Data: Fall with left hip pain and cough.  CHEST - 2 VIEW  Comparison: 05/14/2012.  Findings: Trachea is midline.  Heart size stable.  Descending thoracic aorta is tortuous.  Scarring or atelectasis at the left lung base.  Lungs are otherwise clear.  No pleural fluid.  Upper lumbar compression fracture and kyphosis are unchanged from 03/20/2012.  There appears to be a mid thoracic compression fracture as well, which appears new.  IMPRESSION:  1.  Left basilar atelectasis or scarring. 2.  Mid thoracic compression fracture appears new from 03/20/2012.   Original Report Authenticated By: Leanna Battles, M.D.    Dg Ribs Unilateral W/chest Left  05/15/2012  *RADIOLOGY REPORT*  Clinical Data: Larey Seat last night with pain  LEFT RIBS AND CHEST - 3+ VIEW  Comparison: Portable chest x-ray of 03/21/2012  Findings: There is mild linear atelectasis or scarring at the lung bases.  No pneumothorax is seen.  There is cardiomegaly present.  The left rib  detail films do show an old fracture of the lateral left tenth rib but no acute rib fracture is seen with certainty.  IMPRESSION:  1.  Basilar linear atelectasis left greater than right.  No pneumothorax. 2.  No definite acute left rib fracture.   Original Report Authenticated By: Dwyane Dee, M.D.    Dg Hip Complete Left  05/17/2012  *RADIOLOGY REPORT*  Clinical Data: Fall with left hip pain.  LEFT HIP - COMPLETE 2+ VIEW  Comparison: 05/14/2012.  Findings: Left hip arthroplasty without complicating feature. Minimal degenerative change in the right hip.  Obturator rings are intact.  IMPRESSION: Left hip arthroplasty.  No acute findings.   Original Report Authenticated By: Leanna Battles, M.D.    Dg Hip Complete Left  05/15/2012  *RADIOLOGY REPORT*  Clinical Data:  History of injury from fall.  History of pain.  LEFT HIP - COMPLETE 2+ VIEW  Comparison: CT 03/20/2012.  Findings: Previous left hip arthroplasty has been performed. Hardware is in place without evidence of disruption.  No fracture dislocation is seen.  Pelvic phleboliths are present.  There is slightly osteopenic appearance of bones.  There is narrowing of the right hip joint space with minimal spurring.  No calcific bursitis is seen.  IMPRESSION: Post left hip arthroplasty.  No disruption of hardware is evident. No fracture or dislocation.  Osteopenic appearance of bones.   Original Report Authenticated By: Onalee Hua Call    Ct Head Wo Contrast  05/17/2012  *RADIOLOGY REPORT*  Clinical Data: Fall, weakness.  CT HEAD WITHOUT CONTRAST  Technique:  Contiguous axial images were obtained from the base of the skull through the vertex without contrast.  Comparison: 03/23/2012  Findings: Mild age related volume loss. No acute intracranial abnormality.  Specifically, no hemorrhage, hydrocephalus, mass lesion, acute infarction, or significant intracranial injury.  No acute calvarial abnormality.  Opacification of the left maxillary sinus is stable.  Orbital soft tissues unremarkable.  IMPRESSION: No acute intracranial abnormality.   Original Report Authenticated By: Charlett Nose, M.D.    Ct Abdomen Pelvis W Contrast  06/08/2012  *RADIOLOGY REPORT*  Clinical Data: Status post cryoablation of a right renal oncocytoma on 03/16/2012.  Postprocedural imaging demonstrated development of postoperative retroperitoneal hemorrhage.  The patient has been treated for recent urinary tract infection and has had hypotension and weakness.  CT ABDOMEN AND PELVIS WITH CONTRAST  Technique:  Multidetector CT imaging of the abdomen and pelvis was performed following the standard protocol during bolus administration of intravenous contrast.  Contrast: OMNIPAQUE IOHEXOL 300 MG/ML  SOLN, 50mL OMNIPAQUE IOHEXOL 300 MG/ML  SOLN  Comparison:  05/09/2012 and 03/20/2012.  Findings: Posterior to the right kidney at the level of previously demonstrated retracting postprocedural hematoma, a more liquefied collection is now present which is larger than the most recent demonstrated resolving hematoma.  This area appears multiloculated and measures roughly 6 cm and greatest transverse diameter.  On reconstructions, the height of the total area of the abnormality approaches 9 cm.  Findings are suspicious for development of an abscess at the site of prior postprocedural hemorrhage.  No air is seen in the collection.  The process abuts the kidney but does not obviously involve the renal parenchyma itself.  An ablation defect is not again identified posteriorly at the level of the treated right renal neoplasm.  There is a stable upper pole left renal cyst.  Stable calcified gallstones are identified.  No evidence of ascites.  Bowel loops are unremarkable.  No hernias are seen.  The liver, spleen, pancreas and adrenal glands are unremarkable.  IMPRESSION: Development of a larger and more liquefied collection posterior to the right kidney in the retroperitoneal space.  This is at the level of the previously demonstrated retracting postprocedural perinephric hematoma after cryoablation of a right renal neoplasm in January.  Findings are suspicious for development of abscess/infected hematoma.  Percutaneous aspiration/drainage may be appropriate.   Original Report Authenticated By: Irish Lack, M.D.   Results for orders placed during the hospital encounter of 06/06/12  GLUCOSE, CAPILLARY      Result Value Range   Glucose-Capillary 157 (*) 70 - 99 mg/dL   Comment 1 Documented in Chart     Comment 2 Notify RN    BASIC METABOLIC PANEL      Result Value Range   Sodium 132 (*) 135 - 145 mEq/L   Potassium 4.8  3.5 - 5.1 mEq/L   Chloride 93 (*) 96 - 112 mEq/L   CO2 24  19 - 32 mEq/L   Glucose, Bld 177 (*) 70 - 99 mg/dL   BUN 42 (*) 6 - 23 mg/dL   Creatinine,  Ser 2.13 (*) 0.50 - 1.10 mg/dL   Calcium 9.4  8.4 - 08.6 mg/dL   GFR calc non Af Amer 31 (*) >90 mL/min   GFR calc Af Amer 36 (*) >90 mL/min  CBC WITH DIFFERENTIAL      Result Value Range   WBC 11.4 (*) 4.0 - 10.5 K/uL   RBC 4.04  3.87 - 5.11 MIL/uL   Hemoglobin 12.2  12.0 - 15.0 g/dL   HCT 57.8  46.9 - 62.9 %   MCV 89.9  78.0 - 100.0 fL   MCH 30.2  26.0 - 34.0 pg   MCHC 33.6  30.0 - 36.0 g/dL   RDW 52.8  41.3 - 24.4 %   Platelets 176  150 - 400 K/uL   Neutrophils Relative 81 (*) 43 - 77 %   Neutro Abs 9.2 (*) 1.7 - 7.7 K/uL   Lymphocytes Relative 11 (*) 12 - 46 %   Lymphs Abs 1.2  0.7 - 4.0 K/uL   Monocytes Relative 8  3 - 12 %   Monocytes Absolute 0.9  0.1 - 1.0 K/uL   Eosinophils Relative 0  0 - 5 %   Eosinophils Absolute 0.1  0.0 - 0.7 K/uL   Basophils Relative 0  0 - 1 %   Basophils Absolute 0.0  0.0 - 0.1 K/uL  URINALYSIS, ROUTINE W REFLEX MICROSCOPIC      Result Value Range   Color, Urine YELLOW  YELLOW   APPearance CLOUDY (*) CLEAR   Specific Gravity, Urine 1.024  1.005 - 1.030   pH 5.0  5.0 - 8.0   Glucose, UA NEGATIVE  NEGATIVE mg/dL   Hgb urine dipstick MODERATE (*) NEGATIVE   Bilirubin Urine NEGATIVE  NEGATIVE   Ketones, ur NEGATIVE  NEGATIVE mg/dL   Protein, ur NEGATIVE  NEGATIVE mg/dL   Urobilinogen, UA 0.2  0.0 - 1.0 mg/dL   Nitrite NEGATIVE  NEGATIVE   Leukocytes, UA MODERATE (*) NEGATIVE  LACTIC ACID, PLASMA      Result Value Range   Lactic Acid, Venous 1.0  0.5 - 2.2 mmol/L  URINE MICROSCOPIC-ADD ON      Result Value Range   Squamous Epithelial / LPF FEW (*) RARE   WBC, UA 21-50  <3 WBC/hpf   RBC / HPF 0-2  <3 RBC/hpf   Bacteria, UA RARE  RARE  CORTISOL      Result Value Range   Cortisol, Plasma 8.4    CK      Result Value Range   Total CK 38  7 - 177 U/L  CBC      Result Value Range   WBC 8.8  4.0 - 10.5 K/uL   RBC 3.30 (*) 3.87 - 5.11 MIL/uL   Hemoglobin 10.1 (*) 12.0 - 15.0 g/dL   HCT 16.1 (*) 09.6 - 04.5 %   MCV 90.6  78.0 - 100.0 fL    MCH 30.6  26.0 - 34.0 pg   MCHC 33.8  30.0 - 36.0 g/dL   RDW 40.9  81.1 - 91.4 %   Platelets 136 (*) 150 - 400 K/uL  BASIC METABOLIC PANEL      Result Value Range   Sodium 133 (*) 135 - 145 mEq/L   Potassium 4.3  3.5 - 5.1 mEq/L   Chloride 98  96 - 112 mEq/L   CO2 27  19 - 32 mEq/L   Glucose, Bld 131 (*) 70 - 99 mg/dL   BUN 35 (*) 6 - 23 mg/dL   Creatinine, Ser 7.82 (*) 0.50 - 1.10 mg/dL   Calcium 9.0  8.4 - 95.6 mg/dL   GFR calc non Af Amer 43 (*) >90 mL/min   GFR calc Af Amer 50 (*) >90 mL/min  GLUCOSE, CAPILLARY      Result Value Range   Glucose-Capillary 100 (*) 70 - 99 mg/dL  GLUCOSE, CAPILLARY      Result Value Range   Glucose-Capillary 124 (*) 70 - 99 mg/dL  CBC      Result Value Range   WBC 9.2  4.0 - 10.5 K/uL   RBC 3.64 (*) 3.87 - 5.11 MIL/uL   Hemoglobin 11.2 (*) 12.0 - 15.0 g/dL   HCT 21.3 (*) 08.6 - 57.8 %   MCV 89.8  78.0 - 100.0 fL   MCH 30.8  26.0 - 34.0 pg   MCHC 34.3  30.0 - 36.0 g/dL   RDW 46.9  62.9 - 52.8 %   Platelets 129 (*) 150 - 400 K/uL  BASIC METABOLIC PANEL      Result Value Range   Sodium 132 (*) 135 - 145 mEq/L   Potassium 4.2  3.5 - 5.1 mEq/L   Chloride 97  96 - 112 mEq/L   CO2 29  19 - 32 mEq/L   Glucose, Bld 170 (*) 70 - 99 mg/dL   BUN 24 (*) 6 - 23 mg/dL   Creatinine, Ser 4.13  0.50 - 1.10 mg/dL   Calcium 9.1  8.4 - 24.4 mg/dL   GFR calc non Af Amer 60 (*) >90 mL/min   GFR calc Af Amer 69 (*) >90 mL/min  GLUCOSE, CAPILLARY      Result Value Range   Glucose-Capillary 219 (*) 70 - 99 mg/dL  GLUCOSE, CAPILLARY      Result Value Range   Glucose-Capillary 147 (*) 70 - 99 mg/dL  GLUCOSE, CAPILLARY      Result Value Range   Glucose-Capillary 164 (*) 70 - 99 mg/dL  GLUCOSE, CAPILLARY      Result Value Range   Glucose-Capillary 135 (*) 70 - 99 mg/dL  CBC      Result Value Range   WBC 9.4  4.0 - 10.5 K/uL   RBC 3.53 (*) 3.87 - 5.11 MIL/uL   Hemoglobin 10.7 (*) 12.0 - 15.0 g/dL   HCT 01.0 (*) 27.2 - 53.6 %  MCV 90.1  78.0 -  100.0 fL   MCH 30.3  26.0 - 34.0 pg   MCHC 33.6  30.0 - 36.0 g/dL   RDW 30.8  65.7 - 84.6 %   Platelets 122 (*) 150 - 400 K/uL  BASIC METABOLIC PANEL      Result Value Range   Sodium 133 (*) 135 - 145 mEq/L   Potassium 4.1  3.5 - 5.1 mEq/L   Chloride 97  96 - 112 mEq/L   CO2 29  19 - 32 mEq/L   Glucose, Bld 126 (*) 70 - 99 mg/dL   BUN 23  6 - 23 mg/dL   Creatinine, Ser 9.62  0.50 - 1.10 mg/dL   Calcium 9.4  8.4 - 95.2 mg/dL   GFR calc non Af Amer 64 (*) >90 mL/min   GFR calc Af Amer 75 (*) >90 mL/min  GLUCOSE, CAPILLARY      Result Value Range   Glucose-Capillary 148 (*) 70 - 99 mg/dL  GLUCOSE, CAPILLARY      Result Value Range   Glucose-Capillary 145 (*) 70 - 99 mg/dL  GLUCOSE, CAPILLARY      Result Value Range   Glucose-Capillary 187 (*) 70 - 99 mg/dL  GLUCOSE, CAPILLARY      Result Value Range   Glucose-Capillary 174 (*) 70 - 99 mg/dL   A/P: Pt with hx rt renal oncocytoma cryoablation 03/2012 with subsequent perinephric hematoma; now with recurrent UTI's, weakness, dysuria, hypotension and evidence of liquefying right perinephric hematoma concerning for abscess. Plan is for CT guided aspiration/possible drainage of right perinephric hematoma/abscess on 4/6. Details/risks of procedure d/w pt with her understanding and consent.

## 2012-06-09 NOTE — Progress Notes (Signed)
[facility] PROGRESS NOTE  [name] WJX:[id number] DOB: [birth year] DOA: 06/06/2012 PCP: [name], MD Primary Urologist: Dr. Annabell Howells  Brief narrative [name] is a 79 y.o. female with PMH of HTN, right renal mass s/p cryoablation (biopsy demonstrated benign lesion), who presented to the ED on 06/06/12 with hypotension and weakness. Per the patient her BP was low at her urologists office on 4/1 and so her daughter called her PCP who sent her to the ED. She has had generalized weakness for the past 3 weeks associated with falls at home. Additionally since the procedure she has been having recurrent bouts of urinary tract infection with what would appear to be the same E.Coli organism each time (same sensitivities and resistances), this despite multiple courses of Abx therapy.  In the ED patient noted to initially be hypotensive though her BP improved with fluids and she appeared more dry initially than septic. UA again reveals findings consistent with UTI, she was started on rocephin and hospitalist has been asked to admit.   Assessment/Plan: 1. 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 Urine culture sent off at urologists office shows Escherichia coli sensitive to Rocephin. Patient on Rocephin since admission. Infectious disease consultation appreciated. Interventional  Radiology consulted for possible drainage procedure 2. SIRS, present on admission: Secondary to UTI/? Right Perinephric abscess. Improved. Continue  IV Rocephin  3. Hypotension: Secondary to SIRS versus dehydration. Resolved  4. Acute renal failure: Possibly from dehydration. Resolved. 5. Type II DM: Continue insulins. Reasonable inpatient control.  6. Anemia and thrombocytopenia: Stable 7. Chronic steroid use: Unclear indication. Continue home dose of  prednisone. 8. Hyponatremia:? Chronic: Stable  Code Status:  Full Family Communication:  Discussed with patient. Disposition Plan:  Home when medically stable   Consultants:   Infectious disease    Interventional radiology  Procedures:   None  Antibiotics:   IV Rocephin 4/3 >   HPI/Subjective:  Denies complaints.   Objective: Filed Vitals:   06/08/12 0522 06/08/12 1424 06/08/12 2104 06/09/12 0510  BP: 116/67 104/60 112/64 110/70  Pulse: 93 97 87 89  Temp: 98.9 F (37.2 C) 98.3 F (36.8 C) 98.5 F (36.9 C) 98.6 F (37 C)  TempSrc: Oral Oral Oral Oral  Resp: 18 18 18 18   Height:      Weight:      SpO2: 96% 93% 96% 99%    Intake/Output Summary (Last 24 hours) at 06/09/12 1013 Last data filed at 06/09/12 0145  Gross per 24 hour  Intake    700 ml  Output   1975 ml  Net  -1275 ml   Filed Weights   06/07/12 0456  Weight: 67.722 kg (149 lb 4.8 oz)    Exam:   General exam: Comfortable.   Respiratory system: Clear. No increased work of breathing.  Cardiovascular system: S1 & S2 heard, RRR. No JVD, murmurs, gallops, clicks or pedal edema.   Gastrointestinal system: Abdomen is nondistended, soft and nontender. Normal bowel sounds heard.  Central nervous system: Alert and oriented. No focal neurological deficits.  Extremities: Symmetric 5 x 5 power.   Data Reviewed: Basic Metabolic Panel:  Recent Labs Lab 06/06/12 2153 06/07/12 0515 06/08/12 0425 06/09/12 0522  NA 132* 133* 132* 133*  K 4.8 4.3 4.2 4.1  CL 93* 98 97 97  CO2 24 27 29 29   GLUCOSE 177* 131* 170* 126*  BUN  42* 35* 24* 23  CREATININE 1.48* 1.13* 0.86 0.81  CALCIUM 9.4 9.0 9.1 9.4   Liver Function Tests: No results found for this basename: AST, ALT, ALKPHOS, BILITOT, PROT, ALBUMIN,  in the last 168 hours No results found for this basename: LIPASE, AMYLASE,  in the last 168 hours No results found for this basename: AMMONIA,  in the last 168 hours CBC:  Recent Labs Lab  06/06/12 2153 06/07/12 0515 06/08/12 0425 06/09/12 0522  WBC 11.4* 8.8 9.2 9.4  NEUTROABS 9.2*  --   --   --   HGB 12.2 10.1* 11.2* 10.7*  HCT 36.3 29.9* 32.7* 31.8*  MCV 89.9 90.6 89.8 90.1  PLT 176 136* 129* 122*   Cardiac Enzymes:  Recent Labs Lab 06/06/12 2153  CKTOTAL 38   BNP (last 3 results)  Recent Labs  03/20/12 1008  PROBNP 1166.0*   CBG:  Recent Labs Lab 06/08/12 0747 06/08/12 1146 06/08/12 1659 06/08/12 2117 06/09/12 0728  GLUCAP 164* 135* 148* 145* 187*    Recent Results (from the past 240 hour(s))  URINE CULTURE     Status: None   Collection Time    05/30/12  6:04 PM      Result Value Range Status   Culture ESCHERICHIA COLI   Final   Colony Count >=100,000 COLONIES/ML   Final   Organism ID, Bacteria ESCHERICHIA COLI   Final     Studies: Ct Abdomen Pelvis W Contrast  06/08/2012  *RADIOLOGY REPORT*  Clinical Data: Status post cryoablation of a right renal oncocytoma on 03/16/2012.  Postprocedural imaging demonstrated development of postoperative retroperitoneal hemorrhage.  The patient has been treated for recent urinary tract infection and has had hypotension and weakness.  CT ABDOMEN AND PELVIS WITH CONTRAST  Technique:  Multidetector CT imaging of the abdomen and pelvis was performed following the standard protocol during bolus administration of intravenous contrast.  Contrast: OMNIPAQUE IOHEXOL 300 MG/ML  SOLN, 50mL OMNIPAQUE IOHEXOL 300 MG/ML  SOLN  Comparison: 05/09/2012 and 03/20/2012.  Findings: Posterior to the right kidney at the level of previously demonstrated retracting postprocedural hematoma, a more liquefied collection is now present which is larger than the most recent demonstrated resolving hematoma.  This area appears multiloculated and measures roughly 6 cm and greatest transverse diameter.  On reconstructions, the height of the total area of the abnormality approaches 9 cm.  Findings are suspicious for development of an abscess  at the site of prior postprocedural hemorrhage.  No air is seen in the collection.  The process abuts the kidney but does not obviously involve the renal parenchyma itself.  An ablation defect is not again identified posteriorly at the level of the treated right renal neoplasm.  There is a stable upper pole left renal cyst.  Stable calcified gallstones are identified.  No evidence of ascites.  Bowel loops are unremarkable.  No hernias are seen.  The liver, spleen, pancreas and adrenal glands are unremarkable.  IMPRESSION: Development of a larger and more liquefied collection posterior to the right kidney in the retroperitoneal space.  This is at the level of the previously demonstrated retracting postprocedural perinephric hematoma after cryoablation of a right renal neoplasm in January.  Findings are suspicious for development of abscess/infected hematoma.  Percutaneous aspiration/drainage may be appropriate.   Original Report Authenticated By: Irish Lack, M.D.      Additional labs:   Scheduled Meds: . amitriptyline  50 mg Oral QHS  . aspirin EC  325 mg Oral Daily  .  cefTRIAXone (ROCEPHIN)  IV  1 g Intravenous Q24H  . feeding supplement  237 mL Oral BID BM  . gabapentin  300 mg Oral TID  . heparin  5,000 Units Subcutaneous Q8H  . insulin aspart  0-15 Units Subcutaneous TID WC  . insulin detemir  10 Units Subcutaneous QHS  . multivitamin with minerals  1 tablet Oral Daily  . predniSONE  3 mg Oral q morning - 10a  . sodium chloride  3 mL Intravenous Q12H   Continuous Infusions:   Principal Problem:   Recurrent UTI Active Problems:   Diabetes mellitus   Renal oncocytoma   Retroperitoneal bleed   Hypotension   AKI (acute kidney injury)   SIRS (systemic inflammatory response syndrome)    Time spent:  30 minutes    [facility]  [facility] Pager [phone].   If 8PM-8AM, please contact night-coverage at [email or web address], password [facility] 06/09/2012, 10:13 AM  LOS: 3 days

## 2012-06-09 NOTE — Progress Notes (Addendum)
INFECTIOUS DISEASE PROGRESS NOTE  ID: [name] is a 79 y.o. female with  Principal Problem:   Recurrent UTI Active Problems:   Diabetes mellitus   Renal oncocytoma   Retroperitoneal bleed   Hypotension   AKI (acute kidney injury)   SIRS (systemic inflammatory response syndrome)  Subjective: Without complaints Denies back pain while in bed  Abtx:  Anti-infectives   Start     Dose/Rate Route Frequency Ordered Stop   06/07/12 2200  cefTRIAXone (ROCEPHIN) 1 g in dextrose 5 % 50 mL IVPB     1 g 100 mL/hr over 30 Minutes Intravenous Every 24 hours 06/07/12 0418     06/06/12 2315  cefTRIAXone (ROCEPHIN) 1 g in dextrose 5 % 50 mL IVPB     1 g 100 mL/hr over 30 Minutes Intravenous  Once 06/06/12 2300 06/07/12 0022      Medications:  Scheduled: . amitriptyline  50 mg Oral QHS  . aspirin EC  325 mg Oral Daily  . cefTRIAXone (ROCEPHIN)  IV  1 g Intravenous Q24H  . feeding supplement  237 mL Oral BID BM  . gabapentin  300 mg Oral TID  . heparin  5,000 Units Subcutaneous Q8H  . insulin aspart  0-15 Units Subcutaneous TID WC  . insulin detemir  10 Units Subcutaneous QHS  . multivitamin with minerals  1 tablet Oral Daily  . predniSONE  3 mg Oral q morning - 10a  . sodium chloride  3 mL Intravenous Q12H    Objective: Vital signs in last 24 hours: Temp:  [98.3 F (36.8 C)-98.6 F (37 C)] 98.6 F (37 C) (04/05 0510) Pulse Rate:  [87-97] 89 (04/05 0510) Resp:  [18] 18 (04/05 0510) BP: (104-112)/(60-70) 110/70 mmHg (04/05 0510) SpO2:  [93 %-99 %] 99 % (04/05 0510)   General appearance: cooperative and no distress Resp: clear to auscultation bilaterally Cardio: regular rate and rhythm GI: normal findings: bowel sounds normal and soft, non-tender  Lab Results  Recent Labs  06/08/12 0425 06/09/12 0522  WBC 9.2 9.4  HGB 11.2* 10.7*  HCT 32.7* 31.8*  NA 132* 133*  K 4.2 4.1  CL 97 97  CO2 29 29  BUN 24* 23  CREATININE 0.86 0.81   Liver Panel No results  found for this basename: PROT, ALBUMIN, AST, ALT, ALKPHOS, BILITOT, BILIDIR, IBILI,  in the last 72 hours Sedimentation Rate No results found for this basename: ESRSEDRATE,  in the last 72 hours C-Reactive Protein No results found for this basename: CRP,  in the last 72 hours  Microbiology: Recent Results (from the past 240 hour(s))  URINE CULTURE     Status: None   Collection Time    05/30/12  6:04 PM      Result Value Range Status   Culture ESCHERICHIA COLI   Final   Colony Count >=100,000 COLONIES/ML   Final   Organism ID, Bacteria ESCHERICHIA COLI   Final    Studies/Results: Ct Abdomen Pelvis W Contrast  06/08/2012  *RADIOLOGY REPORT*  Clinical Data: Status post cryoablation of a right renal oncocytoma on 03/16/2012.  Postprocedural imaging demonstrated development of postoperative retroperitoneal hemorrhage.  The patient has been treated for recent urinary tract infection and has had hypotension and weakness.  CT ABDOMEN AND PELVIS WITH CONTRAST  Technique:  Multidetector CT imaging of the abdomen and pelvis was performed following the standard protocol during bolus administration of intravenous contrast.  Contrast: OMNIPAQUE IOHEXOL 300 MG/ML  SOLN, 50mL OMNIPAQUE IOHEXOL  300 MG/ML  SOLN  Comparison: 05/09/2012 and 03/20/2012.  Findings: Posterior to the right kidney at the level of previously demonstrated retracting postprocedural hematoma, a more liquefied collection is now present which is larger than the most recent demonstrated resolving hematoma.  This area appears multiloculated and measures roughly 6 cm and greatest transverse diameter.  On reconstructions, the height of the total area of the abnormality approaches 9 cm.  Findings are suspicious for development of an abscess at the site of prior postprocedural hemorrhage.  No air is seen in the collection.  The process abuts the kidney but does not obviously involve the renal parenchyma itself.  An ablation defect is not again  identified posteriorly at the level of the treated right renal neoplasm.  There is a stable upper pole left renal cyst.  Stable calcified gallstones are identified.  No evidence of ascites.  Bowel loops are unremarkable.  No hernias are seen.  The liver, spleen, pancreas and adrenal glands are unremarkable.  IMPRESSION: Development of a larger and more liquefied collection posterior to the right kidney in the retroperitoneal space.  This is at the level of the previously demonstrated retracting postprocedural perinephric hematoma after cryoablation of a right renal neoplasm in January.  Findings are suspicious for development of abscess/infected hematoma.  Percutaneous aspiration/drainage may be appropriate.   Original Report Authenticated By: Irish Lack, M.D.      Assessment/Plan: Recurrent ( E coli) UTI Oncocytoma Infected hematoma/abscess  Plan for IR aspirate/drain 4/6  No change in anbx (her isolate is R-flouroquinolones, bactrim, amp/sulbactam) Await result of drain  Total days of antibiotics: 4 (ceftriaxone)        Johny Sax Infectious Diseases [phone] 06/09/2012, 1:55 PM   LOS: 3 days

## 2012-06-09 NOTE — ED Provider Notes (Signed)
Medical screening examination/treatment/procedure(s) were performed by non-physician practitioner and as supervising physician I was immediately available for consultation/collaboration.  Flint Melter, MD 06/09/12 [phone]

## 2012-06-09 NOTE — Progress Notes (Signed)
Report received from Knute Neu, RN. No change from initial pm assessment. Will continue to follow the plan of care.

## 2012-06-10 ENCOUNTER — Encounter (HOSPITAL_COMMUNITY): Payer: Self-pay | Admitting: Radiology

## 2012-06-10 ENCOUNTER — Other Ambulatory Visit (HOSPITAL_COMMUNITY): Payer: Self-pay | Admitting: Interventional Radiology

## 2012-06-10 ENCOUNTER — Inpatient Hospital Stay (HOSPITAL_COMMUNITY): Payer: Medicare Other

## 2012-06-10 DIAGNOSIS — E871 Hypo-osmolality and hyponatremia: Secondary | ICD-10-CM | POA: Diagnosis present

## 2012-06-10 DIAGNOSIS — D649 Anemia, unspecified: Secondary | ICD-10-CM

## 2012-06-10 DIAGNOSIS — K6819 Other retroperitoneal abscess: Secondary | ICD-10-CM

## 2012-06-10 LAB — GLUCOSE, CAPILLARY
Glucose-Capillary: 148 mg/dL — ABNORMAL HIGH (ref 70–99)
Glucose-Capillary: 183 mg/dL — ABNORMAL HIGH (ref 70–99)
Glucose-Capillary: 172 mg/dL — ABNORMAL HIGH (ref 70–99)
Glucose-Capillary: 167 mg/dL — ABNORMAL HIGH (ref 70–99)
Glucose-Capillary: 208 mg/dL — ABNORMAL HIGH (ref 70–99)

## 2012-06-10 LAB — BASIC METABOLIC PANEL
BUN: 23 mg/dL (ref 6–23)
CO2: 29 mEq/L (ref 19–32)
Calcium: 9.5 mg/dL (ref 8.4–10.5)
GFR calc non Af Amer: 74 mL/min — ABNORMAL LOW (ref >90)
Glucose, Bld: 194 mg/dL — ABNORMAL HIGH (ref 70–99)

## 2012-06-10 LAB — CBC
MCH: 30.3 pg (ref 26.0–34.0)
MCHC: 33.6 g/dL (ref 30.0–36.0)
Platelets: 125 10*3/uL — ABNORMAL LOW (ref 150–400)
RDW: 14.6 % (ref 11.5–15.5)

## 2012-06-10 LAB — PROTIME-INR: Prothrombin Time: 13.1 seconds (ref 11.6–15.2)

## 2012-06-10 MED ORDER — MIDAZOLAM HCL 2 MG/2ML IJ SOLN
INTRAMUSCULAR | Status: AC | PRN
  Administered 2012-06-10 (×2): 1 mg via INTRAVENOUS

## 2012-06-10 MED ORDER — HYDROCODONE-ACETAMINOPHEN 5-325 MG PO TABS
1.0000 | ORAL_TABLET | ORAL | Status: DC | PRN
  Administered 2012-06-10: 2 via ORAL
  Filled 2012-06-10: qty 2

## 2012-06-10 MED ORDER — FENTANYL CITRATE 0.05 MG/ML IJ SOLN
INTRAMUSCULAR | Status: AC | PRN
  Administered 2012-06-10 (×2): 50 ug via INTRAVENOUS

## 2012-06-10 NOTE — Procedures (Signed)
CT right retroperit abscess drain 10ml bloody purulent aspirate, send for GS, C&S No complication No blood loss. See complete dictation in [facility].

## 2012-06-10 NOTE — Progress Notes (Addendum)
[facility] PROGRESS NOTE  [name] DGL:[id number] DOB: Dec 03, 1924 DOA: 06/06/2012 PCP: [name], MD Primary Urologist: Dr. Annabell Howells  Brief narrative [name] is a 79 y.o. female with PMH of HTN, right renal mass s/p cryoablation (biopsy demonstrated benign lesion), who presented to the ED on 06/06/12 with hypotension and weakness. Per the patient her BP was low at her urologists office on 4/1 and so her daughter called her PCP who sent her to the ED. She has had generalized weakness for the past 3 weeks associated with falls at home. Additionally since the procedure she has been having recurrent bouts of urinary tract infection with what would appear to be the same E.Coli organism each time (same sensitivities and resistances), this despite multiple courses of Abx therapy.  In the ED patient noted to initially be hypotensive though her BP improved with fluids and she appeared more dry initially than septic. UA again reveals findings consistent with UTI, she was started on rocephin and hospitalist has been asked to admit.   Assessment/Plan: 1. 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 Urine culture sent off at urologists office shows Escherichia coli sensitive to Rocephin. Patient on Rocephin since admission. Infectious disease consultation appreciated. S/P CT guided drain on 4/6-draining bloody purulent fluid. Follow cultures. 2. Type II DM: Continue insulins. Reasonable inpatient control.  3. Anemia and thrombocytopenia: Stable 4. Hyponatremia:? Chronic: Sodium counts decreased from 130s >128.? SIADH. Clinically looks euvolemic. Follow BMP in a.m. 5. Chronic steroid use: Unclear indication. Continue home dose of prednisone. 6. SIRS, present on admission: Secondary to UTI/Right Perinephric abscess. Improved. Continue   IV Rocephin  7. Hypotension: Secondary to SIRS versus dehydration. Resolved  8. Acute renal failure: Possibly from dehydration. Resolved.  Code Status:  Full Family Communication:  Discussed with son, Mr. Algis Downs. Disposition Plan:  Home when medically stable   Consultants:   Infectious disease    Interventional radiology  Procedures:   S/P right retroperitoneal abscess drain on 06/10/12  Antibiotics:   IV Rocephin 4/3 >   HPI/Subjective:  Denies complaints. Denies pain.  Objective: Filed Vitals:   06/10/12 0923 06/10/12 1028 06/10/12 1057 06/10/12 1301  BP: 112/69 120/42 101/59 113/62  Pulse: 96 94 93 90  Temp:  97.5 F (36.4 C) 97.5 F (36.4 C)   TempSrc:  Oral Oral Oral  Resp: 12 16 16 18   Height:      Weight:      SpO2: 100% 95%  100%    Intake/Output Summary (Last 24 hours) at 06/10/12 1359 Last data filed at 06/10/12 1300  Gross per 24 hour  Intake   1050 ml  Output   1200 ml  Net   -150 ml   Filed Weights   06/07/12 0456 06/10/12 0508  Weight: 67.722 kg (149 lb 4.8 oz) 66.544 kg (146 lb 11.3 oz)    Exam:   General exam: Comfortable.   Respiratory system: Clear. No increased work of breathing.  Cardiovascular system: S1 & S2 heard, RRR. No JVD, murmurs, gallops, clicks or pedal edema. Telemetry: SR  Gastrointestinal system: Abdomen is nondistended, soft and nontender. Normal bowel sounds heard. Right RP drain-bloody purulent drainage.  Central nervous system: Alert and oriented. No focal neurological deficits.  Extremities: Symmetric 5 x 5 power.   Data Reviewed: Basic Metabolic Panel:  Recent Labs Lab 06/06/12 2153 06/07/12 0515 06/08/12 0425 06/09/12  0522 06/10/12 0514  NA 132* 133* 132* 133* 128*  K 4.8 4.3 4.2 4.1 4.3  CL 93* 98 97 97 93*  CO2 24 27 29 29 29   GLUCOSE 177* 131* 170* 126* 194*  BUN 42* 35* 24* 23 23  CREATININE 1.48* 1.13* 0.86 0.81 0.79  CALCIUM 9.4 9.0 9.1 9.4 9.5   Liver Function Tests: No results  found for this basename: AST, ALT, ALKPHOS, BILITOT, PROT, ALBUMIN,  in the last 168 hours No results found for this basename: LIPASE, AMYLASE,  in the last 168 hours No results found for this basename: AMMONIA,  in the last 168 hours CBC:  Recent Labs Lab 06/06/12 2153 06/07/12 0515 06/08/12 0425 06/09/12 0522 06/10/12 0514  WBC 11.4* 8.8 9.2 9.4 10.6*  NEUTROABS 9.2*  --   --   --   --   HGB 12.2 10.1* 11.2* 10.7* 11.6*  HCT 36.3 29.9* 32.7* 31.8* 34.5*  MCV 89.9 90.6 89.8 90.1 90.1  PLT 176 136* 129* 122* 125*   Cardiac Enzymes:  Recent Labs Lab 06/06/12 2153  CKTOTAL 38   BNP (last 3 results)  Recent Labs  03/20/12 1008  PROBNP 1166.0*   CBG:  Recent Labs Lab 06/09/12 1701 06/09/12 2107 06/10/12 0624 06/10/12 0755 06/10/12 1212  GLUCAP 184* 148* 183* 172* 167*    Recent Results (from the past 240 hour(s))  SURGICAL PCR SCREEN     Status: None   Collection Time    06/10/12  7:33 AM      Result Value Range Status   MRSA, PCR NEGATIVE  NEGATIVE Final   Staphylococcus aureus NEGATIVE  NEGATIVE Final   Comment:            The Xpert SA Assay (FDA     approved for NASAL specimens     in patients over 63 years of age),     is one component of     a comprehensive surveillance     program.  Test performance has     been validated by [employer] for patients greater     than or equal to 8 year old.     It is not intended     to diagnose infection nor to     guide or monitor treatment.     Studies: Ct Guided Abscess Drain  06/10/2012  *RADIOLOGY REPORT*  Clinical data:  Right perinephric abscess post renal cryoablation.  CT-GUIDED RETROPERITONEAL ABSCESS DRAINAGE CATHETER PLACEMENT  Technique and findings: The procedure, risks (including but not limited to bleeding, infection, organ damage), benefits, and alternatives were explained to the patient.  Questions regarding the procedure were encouraged and answered.  The patient understands and consents to  the procedure.The patient was placed prone on the CT gantry and limited   axial scans through the mid abdomen obtained.  The collection was localized and an appropriate skin entry site determined. Operator donned sterile gloves and mask.   Site was marked, prepped with Betadine, draped in usual sterile fashion, infiltrated locally with 1% lidocaine.  Intravenous Fentanyl and Versed were administered as conscious sedation during continuous cardiorespiratory monitoring by the radiology RN, with a total moderate sedation time of less than 30 minutes.  Under CT fluoroscopic guidance, a 19 gauge percutaneous entry needle was advanced into the collection.  Purulent bloody material could be aspirated.  An Amplatz guide wire advanced easily within the collection.  Tract was dilated to allow placement 12-French pigtail catheter, formed within  the dominant component of the collection.  Catheter position was confirmed on CT.  Catheter was secured externally with O-Prolene suture and Statlock, and placed to gravity drain bag.  20 ml of bloody purulent output were aspirated, a sample sent for routine Gram stain and culture. The patient tolerated the procedure well.  No immediate complication.  IMPRESSION: Technically successful CT-guided retroperitoneal abscess drainage catheter placement.   Original Report Authenticated By: D. Andria Rhein, MD    Ct Abdomen Pelvis W Contrast  06/08/2012  *RADIOLOGY REPORT*  Clinical Data: Status post cryoablation of a right renal oncocytoma on 03/16/2012.  Postprocedural imaging demonstrated development of postoperative retroperitoneal hemorrhage.  The patient has been treated for recent urinary tract infection and has had hypotension and weakness.  CT ABDOMEN AND PELVIS WITH CONTRAST  Technique:  Multidetector CT imaging of the abdomen and pelvis was performed following the standard protocol during bolus administration of intravenous contrast.  Contrast: OMNIPAQUE IOHEXOL 300 MG/ML   SOLN, 50mL OMNIPAQUE IOHEXOL 300 MG/ML  SOLN  Comparison: 05/09/2012 and 03/20/2012.  Findings: Posterior to the right kidney at the level of previously demonstrated retracting postprocedural hematoma, a more liquefied collection is now present which is larger than the most recent demonstrated resolving hematoma.  This area appears multiloculated and measures roughly 6 cm and greatest transverse diameter.  On reconstructions, the height of the total area of the abnormality approaches 9 cm.  Findings are suspicious for development of an abscess at the site of prior postprocedural hemorrhage.  No air is seen in the collection.  The process abuts the kidney but does not obviously involve the renal parenchyma itself.  An ablation defect is not again identified posteriorly at the level of the treated right renal neoplasm.  There is a stable upper pole left renal cyst.  Stable calcified gallstones are identified.  No evidence of ascites.  Bowel loops are unremarkable.  No hernias are seen.  The liver, spleen, pancreas and adrenal glands are unremarkable.  IMPRESSION: Development of a larger and more liquefied collection posterior to the right kidney in the retroperitoneal space.  This is at the level of the previously demonstrated retracting postprocedural perinephric hematoma after cryoablation of a right renal neoplasm in January.  Findings are suspicious for development of abscess/infected hematoma.  Percutaneous aspiration/drainage may be appropriate.   Original Report Authenticated By: Irish Lack, M.D.      Additional labs:   Scheduled Meds: . amitriptyline  50 mg Oral QHS  . aspirin EC  325 mg Oral Daily  . cefTRIAXone (ROCEPHIN)  IV  1 g Intravenous Q24H  . feeding supplement  237 mL Oral BID BM  . gabapentin  300 mg Oral TID  . heparin  5,000 Units Subcutaneous Q8H  . insulin aspart  0-15 Units Subcutaneous TID WC  . insulin detemir  10 Units Subcutaneous QHS  . multivitamin with minerals  1  tablet Oral Daily  . predniSONE  3 mg Oral q morning - 10a  . sodium chloride  3 mL Intravenous Q12H   Continuous Infusions:   Principal Problem:   Recurrent UTI Active Problems:   Diabetes mellitus   Renal oncocytoma   Retroperitoneal bleed   Hypotension   AKI (acute kidney injury)   SIRS (systemic inflammatory response syndrome)    Time spent:  30 minutes    [facility]  [facility] Pager [phone].   If 8PM-8AM, please contact night-coverage at [email or web address], password [facility] 06/10/2012, 1:59 PM  LOS: 4 days

## 2012-06-10 NOTE — Progress Notes (Signed)
INFECTIOUS DISEASE PROGRESS NOTE  ID: [name] is a 79 y.o. female with   Principal Problem:   Recurrent UTI Active Problems:   Diabetes mellitus   Renal oncocytoma   Retroperitoneal bleed   Hypotension   AKI (acute kidney injury)   SIRS (systemic inflammatory response syndrome)  Subjective: Without complaints  Abtx:  Anti-infectives   Start     Dose/Rate Route Frequency Ordered Stop   06/07/12 2200  cefTRIAXone (ROCEPHIN) 1 g in dextrose 5 % 50 mL IVPB     1 g 100 mL/hr over 30 Minutes Intravenous Every 24 hours 06/07/12 0418     06/06/12 2315  cefTRIAXone (ROCEPHIN) 1 g in dextrose 5 % 50 mL IVPB     1 g 100 mL/hr over 30 Minutes Intravenous  Once 06/06/12 2300 06/07/12 0022      Medications:  Scheduled: . amitriptyline  50 mg Oral QHS  . aspirin EC  325 mg Oral Daily  . cefTRIAXone (ROCEPHIN)  IV  1 g Intravenous Q24H  . feeding supplement  237 mL Oral BID BM  . gabapentin  300 mg Oral TID  . heparin  5,000 Units Subcutaneous Q8H  . insulin aspart  0-15 Units Subcutaneous TID WC  . insulin detemir  10 Units Subcutaneous QHS  . multivitamin with minerals  1 tablet Oral Daily  . predniSONE  3 mg Oral q morning - 10a  . sodium chloride  3 mL Intravenous Q12H    Objective: Vital signs in last 24 hours: Temp:  [97.5 F (36.4 C)-97.8 F (36.6 C)] 97.5 F (36.4 C) (04/06 1057) Pulse Rate:  [91-99] 93 (04/06 1057) Resp:  [9-20] 16 (04/06 1057) BP: (100-122)/(7-74) 101/59 mmHg (04/06 1057) SpO2:  [94 %-100 %] 95 % (04/06 1028) Weight:  [66.544 kg (146 lb 11.3 oz)] 66.544 kg (146 lb 11.3 oz) (04/06 0508)   General appearance: alert, cooperative and no distress GI: normal findings: bowel sounds normal and soft, non-tender  Lab Results  Recent Labs  06/09/12 0522 06/10/12 0514  WBC 9.4 10.6*  HGB 10.7* 11.6*  HCT 31.8* 34.5*  NA 133* 128*  K 4.1 4.3  CL 97 93*  CO2 29 29  BUN 23 23  CREATININE 0.81 0.79   Liver Panel No results found for  this basename: PROT, ALBUMIN, AST, ALT, ALKPHOS, BILITOT, BILIDIR, IBILI,  in the last 72 hours Sedimentation Rate No results found for this basename: ESRSEDRATE,  in the last 72 hours C-Reactive Protein No results found for this basename: CRP,  in the last 72 hours  Microbiology: Recent Results (from the past 240 hour(s))  SURGICAL PCR SCREEN     Status: None   Collection Time    06/10/12  7:33 AM      Result Value Range Status   MRSA, PCR NEGATIVE  NEGATIVE Final   Staphylococcus aureus NEGATIVE  NEGATIVE Final   Comment:            The Xpert SA Assay (FDA     approved for NASAL specimens     in patients over 36 years of age),     is one component of     a comprehensive surveillance     program.  Test performance has     been validated by [employer] for patients greater     than or equal to 59 year old.     It is not intended     to diagnose  infection nor to     guide or monitor treatment.    Studies/Results: Ct Guided Abscess Drain  06/10/2012  *RADIOLOGY REPORT*  Clinical data:  Right perinephric abscess post renal cryoablation.  CT-GUIDED RETROPERITONEAL ABSCESS DRAINAGE CATHETER PLACEMENT  Technique and findings: The procedure, risks (including but not limited to bleeding, infection, organ damage), benefits, and alternatives were explained to the patient.  Questions regarding the procedure were encouraged and answered.  The patient understands and consents to the procedure.The patient was placed prone on the CT gantry and limited   axial scans through the mid abdomen obtained.  The collection was localized and an appropriate skin entry site determined. Operator donned sterile gloves and mask.   Site was marked, prepped with Betadine, draped in usual sterile fashion, infiltrated locally with 1% lidocaine.  Intravenous Fentanyl and Versed were administered as conscious sedation during continuous cardiorespiratory monitoring by the radiology RN, with a total moderate sedation  time of less than 30 minutes.  Under CT fluoroscopic guidance, a 19 gauge percutaneous entry needle was advanced into the collection.  Purulent bloody material could be aspirated.  An Amplatz guide wire advanced easily within the collection.  Tract was dilated to allow placement 12-French pigtail catheter, formed within the dominant component of the collection.  Catheter position was confirmed on CT.  Catheter was secured externally with O-Prolene suture and Statlock, and placed to gravity drain bag.  20 ml of bloody purulent output were aspirated, a sample sent for routine Gram stain and culture. The patient tolerated the procedure well.  No immediate complication.  IMPRESSION: Technically successful CT-guided retroperitoneal abscess drainage catheter placement.   Original Report Authenticated By: D. Andria Rhein, MD    Ct Abdomen Pelvis W Contrast  06/08/2012  *RADIOLOGY REPORT*  Clinical Data: Status post cryoablation of a right renal oncocytoma on 03/16/2012.  Postprocedural imaging demonstrated development of postoperative retroperitoneal hemorrhage.  The patient has been treated for recent urinary tract infection and has had hypotension and weakness.  CT ABDOMEN AND PELVIS WITH CONTRAST  Technique:  Multidetector CT imaging of the abdomen and pelvis was performed following the standard protocol during bolus administration of intravenous contrast.  Contrast: OMNIPAQUE IOHEXOL 300 MG/ML  SOLN, 50mL OMNIPAQUE IOHEXOL 300 MG/ML  SOLN  Comparison: 05/09/2012 and 03/20/2012.  Findings: Posterior to the right kidney at the level of previously demonstrated retracting postprocedural hematoma, a more liquefied collection is now present which is larger than the most recent demonstrated resolving hematoma.  This area appears multiloculated and measures roughly 6 cm and greatest transverse diameter.  On reconstructions, the height of the total area of the abnormality approaches 9 cm.  Findings are suspicious for  development of an abscess at the site of prior postprocedural hemorrhage.  No air is seen in the collection.  The process abuts the kidney but does not obviously involve the renal parenchyma itself.  An ablation defect is not again identified posteriorly at the level of the treated right renal neoplasm.  There is a stable upper pole left renal cyst.  Stable calcified gallstones are identified.  No evidence of ascites.  Bowel loops are unremarkable.  No hernias are seen.  The liver, spleen, pancreas and adrenal glands are unremarkable.  IMPRESSION: Development of a larger and more liquefied collection posterior to the right kidney in the retroperitoneal space.  This is at the level of the previously demonstrated retracting postprocedural perinephric hematoma after cryoablation of a right renal neoplasm in January.  Findings are suspicious  for development of abscess/infected hematoma.  Percutaneous aspiration/drainage may be appropriate.   Original Report Authenticated By: Irish Lack, M.D.      Assessment/Plan: Recurrent ( E coli) UTI  Oncocytoma  Infected hematoma/abscess   IR aspiration today, purulent, bloody fluid in bag   No change in anbx (her urine isolate is R-flouroquinolones, bactrim, amp/sulbactam)  Await result of drain Cx  Total days of antibiotics: 5 (ceftriaxone)  Johny Sax Infectious Diseases [phone] 06/10/2012, 11:43 AM   LOS: 4 days

## 2012-06-11 DIAGNOSIS — IMO0001 Reserved for inherently not codable concepts without codable children: Secondary | ICD-10-CM | POA: Diagnosis present

## 2012-06-11 DIAGNOSIS — N151 Renal and perinephric abscess: Secondary | ICD-10-CM | POA: Diagnosis present

## 2012-06-11 LAB — BASIC METABOLIC PANEL
BUN: 18 mg/dL (ref 6–23)
Calcium: 9.5 mg/dL (ref 8.4–10.5)
Creatinine, Ser: 0.7 mg/dL (ref 0.50–1.10)
GFR calc Af Amer: 89 mL/min — ABNORMAL LOW (ref >90)
GFR calc non Af Amer: 77 mL/min — ABNORMAL LOW (ref >90)

## 2012-06-11 LAB — GLUCOSE, CAPILLARY: Glucose-Capillary: 133 mg/dL — ABNORMAL HIGH (ref 70–99)

## 2012-06-11 NOTE — Progress Notes (Addendum)
Patient ID: [name], female   DOB: [birth year], 79 y.o.   MRN: [id number]         [facility]    Date of Admission:  06/06/2012           Day 6 ceftriaxone Subjective: She says that she's not feeling very well today. She's having a little bit of pain at her right flank drain site and some headache.  Objective: Temp:  [97.3 F (36.3 C)-98.3 F (36.8 C)] 98.3 F (36.8 C) (04/07 1418) Pulse Rate:  [68-107] 107 (04/07 1418) Resp:  [16-18] 18 (04/07 1418) BP: (107-122)/(63-90) 122/90 mmHg (04/07 1418) SpO2:  [94 %-96 %] 96 % (04/07 1418) Weight:  [66.6 kg (146 lb 13.2 oz)] 66.6 kg (146 lb 13.2 oz) (04/07 4098)  General: Alert and comfortable Skin: No rash Lungs: Clear Cor: Regular S1 and S2 no murmurs Abdomen: Nontender Right flank drain: 30 cc out yesterday; about 70 cc of pink, purulent fluid in the bag today  Lab Results Lab Results  Component Value Date   WBC 10.6* 06/10/2012   HGB 11.6* 06/10/2012   HCT 34.5* 06/10/2012   MCV 90.1 06/10/2012   PLT 125* 06/10/2012    Lab Results  Component Value Date   CREATININE 0.70 06/11/2012   BUN 18 06/11/2012   NA 132* 06/11/2012   K 4.4 06/11/2012   CL 95* 06/11/2012   CO2 28 06/11/2012    Lab Results  Component Value Date   ALT 17 05/30/2012   AST 20 05/30/2012   ALKPHOS 121* 05/30/2012   BILITOT 0.4 05/30/2012      Microbiology: Recent Results (from the past 240 hour(s))  SURGICAL PCR SCREEN     Status: None   Collection Time    06/10/12  7:33 AM      Result Value Range Status   MRSA, PCR NEGATIVE  NEGATIVE Final   Staphylococcus aureus NEGATIVE  NEGATIVE Final   Comment:            The Xpert SA Assay (FDA     approved for NASAL specimens     in patients over 25 years of age),     is one component of     a comprehensive surveillance     program.  Test performance has     been validated by [employer] for patients greater     than or equal to 25 year old.     It is not intended     to  diagnose infection nor to     guide or monitor treatment.  CULTURE, ROUTINE-ABSCESS     Status: None   Collection Time    06/10/12  9:25 AM      Result Value Range Status   Specimen Description ABSCESS RIGHT PERI NEPHRIC ABSCESS   Final   Special Requests NONE   Final   Gram Stain     Final   Value: ABUNDANT WBC PRESENT,BOTH PMN AND MONONUCLEAR     NO SQUAMOUS EPITHELIAL CELLS SEEN     RARE GRAM NEGATIVE RODS   Culture Culture reincubated for better growth   Final   Report Status PENDING   Incomplete    Studies/Results: Ct Guided Abscess Drain  06/10/2012  *RADIOLOGY REPORT*  Clinical data:  Right perinephric abscess post renal cryoablation.  CT-GUIDED RETROPERITONEAL ABSCESS DRAINAGE CATHETER PLACEMENT  Technique and findings: The procedure, risks (including but not limited to bleeding, infection, organ damage), benefits,  and alternatives were explained to the patient.  Questions regarding the procedure were encouraged and answered.  The patient understands and consents to the procedure.The patient was placed prone on the CT gantry and limited   axial scans through the mid abdomen obtained.  The collection was localized and an appropriate skin entry site determined. Operator donned sterile gloves and mask.   Site was marked, prepped with Betadine, draped in usual sterile fashion, infiltrated locally with 1% lidocaine.  Intravenous Fentanyl and Versed were administered as conscious sedation during continuous cardiorespiratory monitoring by the radiology RN, with a total moderate sedation time of less than 30 minutes.  Under CT fluoroscopic guidance, a 19 gauge percutaneous entry needle was advanced into the collection.  Purulent bloody material could be aspirated.  An Amplatz guide wire advanced easily within the collection.  Tract was dilated to allow placement 12-French pigtail catheter, formed within the dominant component of the collection.  Catheter position was confirmed on CT.  Catheter was  secured externally with O-Prolene suture and Statlock, and placed to gravity drain bag.  20 ml of bloody purulent output were aspirated, a sample sent for routine Gram stain and culture. The patient tolerated the procedure well.  No immediate complication.  IMPRESSION: Technically successful CT-guided retroperitoneal abscess drainage catheter placement.   Original Report Authenticated By: D. Andria Rhein, MD     Assessment: Her recurrent Escherichia coli UTIs and recent failure to thrive are do to a right renal abscess complicating her recent ablation of the right renal oncocytoma. I would prefer to keep her on IV ceftriaxone for now. I agreed she will need followup CT scan once drain output decreases below 10 cc daily.  Plan: 1. Continue ceftriaxone 2. Monitor drain output  Cliffton Asters, MD [facility] [phone] pager   [phone] cell 06/11/2012, 3:27 PM

## 2012-06-11 NOTE — Progress Notes (Signed)
Clinical Social Work Department BRIEF PSYCHOSOCIAL ASSESSMENT 06/11/2012  Patient:  [name], [name]     Account Number:  [id number]     Admit date:  06/06/2012  Clinical Social Worker:  Hattie Perch  Date/Time:  06/11/2012 12:00 M  Referred by:  Physician  Date Referred:  06/11/2012 Referred for  SNF Placement  SNF Placement   Other Referral:   Interview type:  Family Other interview type:    PSYCHOSOCIAL DATA Living Status:  FAMILY Admitted from facility:   Level of care:   Primary support name:  [name] Primary support relationship to patient:  CHILD, ADULT Degree of support available:   good    CURRENT CONCERNS Current Concerns  Post-Acute Placement   Other Concerns:    SOCIAL WORK ASSESSMENT / PLAN CSW spoke with patient's son. he states that patient lives with him. CSW explained recommendations of physical therapist. He states that he does not want patient to go to rehab. patient has been to rehab previously and it was a bad experience. he prefers patient to go home upon discharge. he wants patient to stay in the hospital until she is able to be home alone. CSW explained that is a rehabilitative need. CSW again explained how patient did in physical therapy and the recommendation. he continues to refuse snf.   Assessment/plan status:   Other assessment/ plan:   Information/referral to community resources:    PATIENT'S/FAMILY'S RESPONSE TO PLAN OF CARE: agreeable to home with home health.

## 2012-06-11 NOTE — Consult Note (Signed)
Subjective: I was asked to see Mrs. Spallone in consultation by Dr. Waymon Amato for a right renal abscess.   She was seen in my office at the end of March and had an e. Coli on culture from 3/27.   She was given IM rocephin and had been placed on Keflex.   She was admitted on 4/2 and was found to have a 9cm right renal abscess on a CT on 4/4  in the area of her post ablation hematoma where she had an oncocytoma ablated by IR.   She was started on Rocephin.  A CT guided drainage tube was placed on 4/6.   There were GNR in the aspirate but the culture is pending.  Her culture from our office showed sensitivities to cephalosporins, aminoglycocides, zosyn and imipenem and resistance to tetracyclines, ampicillin and quinolones.   She is afebrile on treatment and is feeling better.    ROS: Negative except as above.  She has no pain or fever.   Past Medical History  Diagnosis Date  . Diabetes mellitus   . HTN (hypertension)   . Diabetes mellitus   . Cataract   . Osteoporosis   . 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 Carpal tunnel syndrome of right wrist 02/09/2011  . TIA (transient ischemic attack) 02/09/2011  . Right renal mass   . COPD (chronic obstructive pulmonary disease)     no inhalers used   . Pneumonia dec 2012  . CHF (congestive heart failure)   . Congestive heart failure   . Arthritis     ra  . Degenerative arthritis of cervical spine 02/09/2011  . Degenerative arthritis of hip 02/09/2011  . Degenerative arthritis of knee 02/09/2011  . PONV (postoperative nausea and vomiting)     in past, none recently  . Macular degeneration     both eyes   Past Surgical History  Procedure Laterality Date  . Back surgery  6 yrs ago    lower  . Eye surgery  yrs ago    both eyes cataract surgery  . Joint replacement  10 yrs ago    left hip, both knees replaced one 3 yrs ago, one 6 yrs ago  . Upper neck surgery  8 yrs ago  . Right carpal tunnel release  5  yrs go  . Right arm plate  5 yrs ago  . Both elbows surgery  10-12 yrs ago   History   Social History  . Marital Status: Widowed    Spouse Name: N/A    Number of Children: N/A  . Years of Education: N/A   Occupational History  . Not on file.   Social History Main Topics  . Smoking status: Former Smoker -- 2.00 packs/day for 30 years    Types: Cigarettes    Quit date: 01/11/1983  . Smokeless tobacco: Never Used  . Alcohol Use: No  . Drug Use: No  . Sexually Active: No   Other Topics Concern  . Not on file   Social History Narrative  . No narrative on file   Family History  Problem Relation Age of Onset  . Leukemia    . Cancer Mother   . Cancer Father   . Cancer Brother   . Cancer Daughter      Objective: Vital signs in last 24 hours: Temp:  [97.3 F (36.3 C)-98.3 F (36.8 C)] 97.3 F (36.3 C) (04/07 0648) Pulse Rate:  [68-99] 87 (04/07 0648)  Resp:  [9-18] 16 (04/07 0648) BP: (100-120)/(7-74) 107/63 mmHg (04/07 0648) SpO2:  [94 %-100 %] 94 % (04/07 0648) Weight:  [66.6 kg (146 lb 13.2 oz)] 66.6 kg (146 lb 13.2 oz) (04/07 0648)  Intake/Output from previous day: 04/06 0701 - 04/07 0700 In: 1170 [P.O.:1120; IV Piggyback:50] Out: 1180 [Urine:1100; Drains:30] Intake/Output this shift:    General appearance: alert and no distress Resp: clear to auscultation bilaterally Cardio: regular rate and rhythm GI: soft, non-tender; bowel sounds normal; no masses,  no organomegaly  Lab Results:   Recent Labs  06/09/12 0522 06/10/12 0514  WBC 9.4 10.6*  HGB 10.7* 11.6*  HCT 31.8* 34.5*  PLT 122* 125*   BMET  Recent Labs  06/10/12 0514 06/11/12 0457  NA 128* 132*  K 4.3 4.4  CL 93* 95*  CO2 29 28  GLUCOSE 194* 133*  BUN 23 18  CREATININE 0.79 0.70  CALCIUM 9.5 9.5   PT/INR  Recent Labs  06/10/12 0514  LABPROT 13.1  INR 1.00   ABG No results found for this basename: PHART, PCO2, PO2, HCO3,  in the last 72 hours  Studies/Results: Ct  Guided Abscess Drain  06/10/2012  *RADIOLOGY REPORT*  Clinical data:  Right perinephric abscess post renal cryoablation.  CT-GUIDED RETROPERITONEAL ABSCESS DRAINAGE CATHETER PLACEMENT  Technique and findings: The procedure, risks (including but not limited to bleeding, infection, organ damage), benefits, and alternatives were explained to the patient.  Questions regarding the procedure were encouraged and answered.  The patient understands and consents to the procedure.The patient was placed prone on the CT gantry and limited   axial scans through the mid abdomen obtained.  The collection was localized and an appropriate skin entry site determined. Operator donned sterile gloves and mask.   Site was marked, prepped with Betadine, draped in usual sterile fashion, infiltrated locally with 1% lidocaine.  Intravenous Fentanyl and Versed were administered as conscious sedation during continuous cardiorespiratory monitoring by the radiology RN, with a total moderate sedation time of less than 30 minutes.  Under CT fluoroscopic guidance, a 19 gauge percutaneous entry needle was advanced into the collection.  Purulent bloody material could be aspirated.  An Amplatz guide wire advanced easily within the collection.  Tract was dilated to allow placement 12-French pigtail catheter, formed within the dominant component of the collection.  Catheter position was confirmed on CT.  Catheter was secured externally with O-Prolene suture and Statlock, and placed to gravity drain bag.  20 ml of bloody purulent output were aspirated, a sample sent for routine Gram stain and culture. The patient tolerated the procedure well.  No immediate complication.  IMPRESSION: Technically successful CT-guided retroperitoneal abscess drainage catheter placement.   Original Report Authenticated By: D. Andria Rhein, MD     Anti-infectives: Anti-infectives   Start     Dose/Rate Route Frequency Ordered Stop   06/07/12 2200  cefTRIAXone (ROCEPHIN) 1  g in dextrose 5 % 50 mL IVPB     1 g 100 mL/hr over 30 Minutes Intravenous Every 24 hours 06/07/12 0418     06/06/12 2315  cefTRIAXone (ROCEPHIN) 1 g in dextrose 5 % 50 mL IVPB     1 g 100 mL/hr over 30 Minutes Intravenous  Once 06/06/12 2300 06/07/12 0022      Current Facility-Administered Medications  Medication Dose Route Frequency Provider Last Rate Last Dose  . acetaminophen (TYLENOL) tablet 650 mg  650 mg Oral Q6H PRN Elease Etienne, MD   650 mg at 06/07/12  1342  . amitriptyline (ELAVIL) tablet 50 mg  50 mg Oral QHS Hillary Bow, DO   50 mg at 06/10/12 2218  . aspirin EC tablet 325 mg  325 mg Oral Daily Hillary Bow, DO   325 mg at 06/11/12 0744  . cefTRIAXone (ROCEPHIN) 1 g in dextrose 5 % 50 mL IVPB  1 g Intravenous Q24H Hillary Bow, DO   1 g at 06/10/12 2217  . feeding supplement (GLUCERNA SHAKE) liquid 237 mL  237 mL Oral BID BM Lorraine Lax, RD   237 mL at 06/10/12 1414  . gabapentin (NEURONTIN) capsule 300 mg  300 mg Oral TID Hillary Bow, DO   300 mg at 06/10/12 2218  . heparin injection 5,000 Units  5,000 Units Subcutaneous Q8H D Jeananne Rama, PA-C   5,000 Units at 06/11/12 0705  . HYDROcodone-acetaminophen (NORCO/VICODIN) 5-325 MG per tablet 1-2 tablet  1-2 tablet Oral Q4H PRN Dayne Oley Balm III, MD   2 tablet at 06/10/12 2204  . insulin aspart (novoLOG) injection 0-15 Units  0-15 Units Subcutaneous TID WC Hillary Bow, DO   5 Units at 06/10/12 1738  . insulin detemir (LEVEMIR) injection 10 Units  10 Units Subcutaneous QHS Hillary Bow, DO   10 Units at 06/10/12 2217  . multivitamin with minerals tablet 1 tablet  1 tablet Oral Daily Lorraine Lax, RD   1 tablet at 06/10/12 1029  . oxybutynin (DITROPAN) tablet 5 mg  5 mg Oral TID PRN Hillary Bow, DO      . predniSONE (DELTASONE) tablet 3 mg  3 mg Oral q morning - 10a Hillary Bow, DO   3 mg at 06/10/12 1029  . sodium chloride 0.9 % injection 3 mL  3 mL Intravenous Q12H Hillary Bow, DO   3 mL at 06/10/12 2217  . traMADol (ULTRAM) tablet 50 mg  50 mg Oral Q8H PRN Hillary Bow, DO   50 mg at 06/09/12 2358    Assessment: Right renal abscess doing well on rocephin and post IR drainage. Wound culture pending.  Plan: Could probably switch back to po Keflex based on her prior culture. Rescan in 1-2 weeks to determine timing of tube removal.  CC: Dr. Waymon Amato, Dr. Fredia Sorrow and Dr. Ninetta Lights.     LOS: 5 days    Lameka Disla J 06/11/2012

## 2012-06-11 NOTE — Progress Notes (Signed)
[facility] PROGRESS NOTE  LASHAI GROSCH NFA:[id number] DOB: [birth year] DOA: 06/06/2012 PCP: [name], MD Primary Urologist: Dr. Annabell Howells  Brief narrative [name] is a 79 y.o. female with PMH of HTN, right renal mass s/p cryoablation (biopsy demonstrated benign lesion), who presented to the ED on 06/06/12 with hypotension and weakness. Per the patient her BP was low at her urologists office on 4/1 and so her daughter called her PCP who sent her to the ED. She has had generalized weakness for the past 3 weeks associated with falls at home. Additionally since the procedure she has been having recurrent bouts of urinary tract infection with what would appear to be the same E.Coli organism each time (same sensitivities and resistances), this despite multiple courses of Abx therapy.  In the ED patient noted to initially be hypotensive though her BP improved with fluids and she appeared more dry initially than septic. UA again reveals findings consistent with UTI, she was started on rocephin and hospitalist has been asked to admit.   Assessment/Plan: 1. 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 OP Urine culture: Escherichia coli sensitive to Rocephin. Patient on Rocephin since admission. Infectious disease consultation appreciated. S/P CT guided drain on 4/6-draining bloody purulent fluid-rare gram-negative rods on Gram stain. Drain will be in place for 7-10 days per radiology who will coordinate outpatient CT imaging to decide timing of removal. If the patient and family can manage drain at home, home with home health versus SNF. Duration of antibiotics per ID. 2. Type II DM: Continue insulins. Reasonable inpatient control.  3. Anemia and thrombocytopenia: Stable 4. Hyponatremia:? Chronic: Sodium counts decreased from 130s >128.?  SIADH. Clinically looks euvolemic. Stable.  5. Chronic steroid use: Unclear indication. Continue home dose of prednisone. 6. SIRS, present on admission: Secondary to UTI/Right Perinephric abscess. Resolved. Continue  IV Rocephin  7. Hypotension: Secondary to SIRS versus dehydration. Resolved  8. Acute renal failure: Possibly from dehydration. Resolved.  Code Status:  Full Family Communication:  Discussed with son, Mr. Algis Downs on 4/6. Disposition Plan:  Home when medically stable   Consultants:   Infectious disease    Interventional radiology  Procedures:   S/P right retroperitoneal abscess drain on 06/10/12  Antibiotics:   IV Rocephin 4/3 >   HPI/Subjective:  Denies complaints. Denies pain.  Objective: Filed Vitals:   06/10/12 1301 06/10/12 2115 06/11/12 0648 06/11/12 1418  BP: 113/62 113/63 107/63 122/90  Pulse: 90 68 87 107  Temp:  98.3 F (36.8 C) 97.3 F (36.3 C) 98.3 F (36.8 C)  TempSrc: Oral Oral Oral Oral  Resp: 18 16 16 18   Height:      Weight:   66.6 kg (146 lb 13.2 oz)   SpO2: 100% 94% 94% 96%    Intake/Output Summary (Last 24 hours) at 06/11/12 1454 Last data filed at 06/11/12 1000  Gross per 24 hour  Intake    660 ml  Output   1280 ml  Net   -620 ml   Filed Weights   06/07/12 0456 06/10/12 0508 06/11/12 0648  Weight: 67.722 kg (149 lb 4.8 oz) 66.544 kg (146 lb 11.3 oz) 66.6 kg (146 lb 13.2 oz)    Exam:   General exam: Comfortable.   Respiratory system: Clear. No increased work of breathing.  Cardiovascular system: S1 & S2 heard, RRR. No JVD, murmurs, gallops, clicks or pedal  edema.   Gastrointestinal system: Abdomen is nondistended, soft and nontender. Normal bowel sounds heard. Right RP drain-bloody purulent drainage.  Central nervous system: Alert and oriented. No focal neurological deficits.  Extremities: Symmetric 5 x 5 power.   Data Reviewed: Basic Metabolic Panel:  Recent Labs Lab 06/07/12 0515 06/08/12 0425  06/09/12 0522 06/10/12 0514 06/11/12 0457  NA 133* 132* 133* 128* 132*  K 4.3 4.2 4.1 4.3 4.4  CL 98 97 97 93* 95*  CO2 27 29 29 29 28   GLUCOSE 131* 170* 126* 194* 133*  BUN 35* 24* 23 23 18   CREATININE 1.13* 0.86 0.81 0.79 0.70  CALCIUM 9.0 9.1 9.4 9.5 9.5   Liver Function Tests: No results found for this basename: AST, ALT, ALKPHOS, BILITOT, PROT, ALBUMIN,  in the last 168 hours No results found for this basename: LIPASE, AMYLASE,  in the last 168 hours No results found for this basename: AMMONIA,  in the last 168 hours CBC:  Recent Labs Lab 06/06/12 2153 06/07/12 0515 06/08/12 0425 06/09/12 0522 06/10/12 0514  WBC 11.4* 8.8 9.2 9.4 10.6*  NEUTROABS 9.2*  --   --   --   --   HGB 12.2 10.1* 11.2* 10.7* 11.6*  HCT 36.3 29.9* 32.7* 31.8* 34.5*  MCV 89.9 90.6 89.8 90.1 90.1  PLT 176 136* 129* 122* 125*   Cardiac Enzymes:  Recent Labs Lab 06/06/12 2153  CKTOTAL 38   BNP (last 3 results)  Recent Labs  03/20/12 1008  PROBNP 1166.0*   CBG:  Recent Labs Lab 06/10/12 1212 06/10/12 1655 06/10/12 2119 06/11/12 0755 06/11/12 1203  GLUCAP 167* 208* 91 133* 174*    Recent Results (from the past 240 hour(s))  SURGICAL PCR SCREEN     Status: None   Collection Time    06/10/12  7:33 AM      Result Value Range Status   MRSA, PCR NEGATIVE  NEGATIVE Final   Staphylococcus aureus NEGATIVE  NEGATIVE Final   Comment:            The Xpert SA Assay (FDA     approved for NASAL specimens     in patients over 19 years of age),     is one component of     a comprehensive surveillance     program.  Test performance has     been validated by [employer] for patients greater     than or equal to 80 year old.     It is not intended     to diagnose infection nor to     guide or monitor treatment.  CULTURE, ROUTINE-ABSCESS     Status: None   Collection Time    06/10/12  9:25 AM      Result Value Range Status   Specimen Description ABSCESS RIGHT PERI NEPHRIC  ABSCESS   Final   Special Requests NONE   Final   Gram Stain     Final   Value: ABUNDANT WBC PRESENT,BOTH PMN AND MONONUCLEAR     NO SQUAMOUS EPITHELIAL CELLS SEEN     RARE GRAM NEGATIVE RODS   Culture Culture reincubated for better growth   Final   Report Status PENDING   Incomplete     Studies: Ct Guided Abscess Drain  06/10/2012  *RADIOLOGY REPORT*  Clinical data:  Right perinephric abscess post renal cryoablation.  CT-GUIDED RETROPERITONEAL ABSCESS DRAINAGE CATHETER PLACEMENT  Technique and findings: The procedure, risks (including but not limited to  bleeding, infection, organ damage), benefits, and alternatives were explained to the patient.  Questions regarding the procedure were encouraged and answered.  The patient understands and consents to the procedure.The patient was placed prone on the CT gantry and limited   axial scans through the mid abdomen obtained.  The collection was localized and an appropriate skin entry site determined. Operator donned sterile gloves and mask.   Site was marked, prepped with Betadine, draped in usual sterile fashion, infiltrated locally with 1% lidocaine.  Intravenous Fentanyl and Versed were administered as conscious sedation during continuous cardiorespiratory monitoring by the radiology RN, with a total moderate sedation time of less than 30 minutes.  Under CT fluoroscopic guidance, a 19 gauge percutaneous entry needle was advanced into the collection.  Purulent bloody material could be aspirated.  An Amplatz guide wire advanced easily within the collection.  Tract was dilated to allow placement 12-French pigtail catheter, formed within the dominant component of the collection.  Catheter position was confirmed on CT.  Catheter was secured externally with O-Prolene suture and Statlock, and placed to gravity drain bag.  20 ml of bloody purulent output were aspirated, a sample sent for routine Gram stain and culture. The patient tolerated the procedure well.  No  immediate complication.  IMPRESSION: Technically successful CT-guided retroperitoneal abscess drainage catheter placement.   Original Report Authenticated By: D. Andria Rhein, MD      Additional labs:   Scheduled Meds: . amitriptyline  50 mg Oral QHS  . aspirin EC  325 mg Oral Daily  . cefTRIAXone (ROCEPHIN)  IV  1 g Intravenous Q24H  . feeding supplement  237 mL Oral BID BM  . gabapentin  300 mg Oral TID  . heparin  5,000 Units Subcutaneous Q8H  . insulin aspart  0-15 Units Subcutaneous TID WC  . insulin detemir  10 Units Subcutaneous QHS  . multivitamin with minerals  1 tablet Oral Daily  . predniSONE  3 mg Oral q morning - 10a  . sodium chloride  3 mL Intravenous Q12H   Continuous Infusions:   Principal Problem:   Recurrent UTI Active Problems:   Diabetes mellitus   Renal oncocytoma   Anemia   Retroperitoneal bleed   Hypotension   AKI (acute kidney injury)   SIRS (systemic inflammatory response syndrome)   Hyponatremia    Time spent:  30 minutes    [facility]  [facility] Pager [phone].   If 8PM-8AM, please contact night-coverage at [email or web address], password [facility] 06/11/2012, 2:54 PM  LOS: 5 days

## 2012-06-11 NOTE — Progress Notes (Signed)
Subjective: Pt feeling ok. Better since drain placed. A little sore at drain site.  Objective: Physical Exam: BP 107/63  Pulse 87  Temp(Src) 97.3 F (36.3 C) (Oral)  Resp 16  Ht 5\' 2"  (1.575 m)  Wt 146 lb 13.2 oz (66.6 kg)  BMI 26.85 kg/m2  SpO2 94% Rt drain intact, site clean, mildly tender. Output blood tinged purulence. 30cc recorded yesterday, about 15cc in bag now   Labs: CBC  Recent Labs  06/09/12 0522 06/10/12 0514  WBC 9.4 10.6*  HGB 10.7* 11.6*  HCT 31.8* 34.5*  PLT 122* 125*   BMET  Recent Labs  06/10/12 0514 06/11/12 0457  NA 128* 132*  K 4.3 4.4  CL 93* 95*  CO2 29 28  GLUCOSE 194* 133*  BUN 23 18  CREATININE 0.79 0.70  CALCIUM 9.5 9.5   LFT No results found for this basename: PROT, ALBUMIN, AST, ALT, ALKPHOS, BILITOT, BILIDIR, IBILI, LIPASE,  in the last 72 hours PT/INR  Recent Labs  06/10/12 0514  LABPROT 13.1  INR 1.00     Studies/Results: Ct Guided Abscess Drain  06/10/2012  *RADIOLOGY REPORT*  Clinical data:  Right perinephric abscess post renal cryoablation.  CT-GUIDED RETROPERITONEAL ABSCESS DRAINAGE CATHETER PLACEMENT  Technique and findings: The procedure, risks (including but not limited to bleeding, infection, organ damage), benefits, and alternatives were explained to the patient.  Questions regarding the procedure were encouraged and answered.  The patient understands and consents to the procedure.The patient was placed prone on the CT gantry and limited   axial scans through the mid abdomen obtained.  The collection was localized and an appropriate skin entry site determined. Operator donned sterile gloves and mask.   Site was marked, prepped with Betadine, draped in usual sterile fashion, infiltrated locally with 1% lidocaine.  Intravenous Fentanyl and Versed were administered as conscious sedation during continuous cardiorespiratory monitoring by the radiology RN, with a total moderate sedation time of less than 30 minutes.   Under CT fluoroscopic guidance, a 19 gauge percutaneous entry needle was advanced into the collection.  Purulent bloody material could be aspirated.  An Amplatz guide wire advanced easily within the collection.  Tract was dilated to allow placement 12-French pigtail catheter, formed within the dominant component of the collection.  Catheter position was confirmed on CT.  Catheter was secured externally with O-Prolene suture and Statlock, and placed to gravity drain bag.  20 ml of bloody purulent output were aspirated, a sample sent for routine Gram stain and culture. The patient tolerated the procedure well.  No immediate complication.  IMPRESSION: Technically successful CT-guided retroperitoneal abscess drainage catheter placement.   Original Report Authenticated By: D. Andria Rhein, MD     Assessment/Plan: Rt RP abscess/infected hematoma s/p perc drain Cont to follow while inpt. Will coordinate follow up appt with Dr. Fredia Sorrow and repeat CT at output imaging center in next 1-2 weeks. Will likely need [facility] RN for daily drain flush unless son willing to do at home.    LOS: 5 days    Brayton El PA-C 06/11/2012 9:16 AM

## 2012-06-11 NOTE — Progress Notes (Signed)
Physical Therapy Treatment Patient Details Name: [name] MRN: [id number] DOB: 1924-04-07 Today's Date: 06/11/2012 Time: 4098-1191 PT Time Calculation (min): 20 min  PT Assessment / Plan / Recommendation Comments on Treatment Session  Pt s/p CT right retroperit abscess drain.  Pt reports soreness at drain site however agreeable to ambulate.  Pt requiring assist for mobility.  Pt states possible d/c to SNF however she would prefer home.  Discussed assist needed to get out of bed today and would require assist for OOB and mobility if d/c home.     Follow Up Recommendations  SNF;Supervision for mobility/OOB     Does the patient have the potential to tolerate intense rehabilitation     Barriers to Discharge        Equipment Recommendations  None recommended by PT    Recommendations for Other Services    Frequency     Plan Discharge plan needs to be updated;Frequency remains appropriate    Precautions / Restrictions Precautions Precautions: Fall Precaution Comments: R drain   Pertinent Vitals/Pain Sore drain site    Mobility  Bed Mobility Bed Mobility: Supine to Sit;Sitting - Scoot to Edge of Bed Supine to Sit: HOB elevated;3: Mod assist Sitting - Scoot to Edge of Bed: 3: Mod assist Details for Bed Mobility Assistance: verbal cues for technique, assist for trunk upright, use of bed pad to scoot hips to EOB Transfers Transfers: Sit to Stand;Stand to Sit;Stand Pivot Transfers Sit to Stand: 4: Min assist;From bed Stand to Sit: 4: Min assist;To chair/3-in-1;4: Min guard Details for Transfer Assistance: verbal cues for safe technique including taking RW back to recliner and careful not to sit on drain Ambulation/Gait Ambulation/Gait Assistance: 4: Min guard Ambulation Distance (Feet): 160 Feet Assistive device: Rolling walker Ambulation/Gait Assistance Details: verbal cues for posture and RW distance Gait Pattern: Step-through pattern;Decreased stride length;Trunk  flexed Gait velocity: decreased    Exercises     PT Diagnosis:    PT Problem List:   PT Treatment Interventions:     PT Goals Acute Rehab PT Goals PT Goal: Supine/Side to Sit - Progress: Progressing toward goal PT Goal: Sit to Stand - Progress: Progressing toward goal PT Goal: Stand to Sit - Progress: Progressing toward goal PT Goal: Ambulate - Progress: Progressing toward goal  Visit Information  Last PT Received On: 06/11/12 Assistance Needed: +1    Subjective Data  Subjective: I have a drain over here. (right side)   Cognition  Cognition Overall Cognitive Status: Appears within functional limits for tasks assessed/performed Arousal/Alertness: Awake/alert Orientation Level: Appears intact for tasks assessed Behavior During Session: [facility] for tasks performed    Balance     End of Session PT - End of Session Activity Tolerance: Patient tolerated treatment well Patient left: in chair;with call bell/phone within reach   GP     Sabrinna Yearwood,KATHrine E 06/11/2012, 1:01 PM Zenovia Jarred, PT, DPT 06/11/2012 Pager: [phone]

## 2012-06-12 LAB — GLUCOSE, CAPILLARY
Glucose-Capillary: 150 mg/dL — ABNORMAL HIGH (ref 70–99)
Glucose-Capillary: 249 mg/dL — ABNORMAL HIGH (ref 70–99)
Glucose-Capillary: 168 mg/dL — ABNORMAL HIGH (ref 70–99)

## 2012-06-12 NOTE — Progress Notes (Signed)
Patient ID: [name], female   DOB: February 17, 1925, 79 y.o.   MRN: [id number]         [facility]    Date of Admission:  06/06/2012           Day 7 ceftriaxone Principal Problem:   Renal abscess, right Active Problems:   SIRS (systemic inflammatory response syndrome)   Renal oncocytoma   Retroperitoneal bleed   Recurrent UTI   Hypotension   Diabetes mellitus   Anemia   AKI (acute kidney injury)   Hyponatremia   . amitriptyline  50 mg Oral QHS  . aspirin EC  325 mg Oral Daily  . cefTRIAXone (ROCEPHIN)  IV  1 g Intravenous Q24H  . feeding supplement  237 mL Oral BID BM  . gabapentin  300 mg Oral TID  . heparin  5,000 Units Subcutaneous Q8H  . insulin aspart  0-15 Units Subcutaneous TID WC  . insulin detemir  10 Units Subcutaneous QHS  . multivitamin with minerals  1 tablet Oral Daily  . predniSONE  3 mg Oral q morning - 10a  . sodium chloride  3 mL Intravenous Q12H    Subjective: She is feeling better and states that her aches and pains are improving.  Objective: Temp:  [97.5 F (36.4 C)-98.1 F (36.7 C)] 98 F (36.7 C) (04/08 1305) Pulse Rate:  [89-99] 89 (04/08 1305) Resp:  [16-17] 17 (04/08 1305) BP: (101-121)/(70-84) 121/80 mmHg (04/08 1305) SpO2:  [96 %-99 %] 99 % (04/08 1305) Weight:  [66.543 kg (146 lb 11.2 oz)] 66.543 kg (146 lb 11.2 oz) (04/08 0500)  General: Alert and smiling Abdomen: Soft and nontender 80 cc of purulent fluid drained yesterday; output today not recorded yet  Lab Results Lab Results  Component Value Date   WBC 10.6* 06/10/2012   HGB 11.6* 06/10/2012   HCT 34.5* 06/10/2012   MCV 90.1 06/10/2012   PLT 125* 06/10/2012    Microbiology: Recent Results (from the past 240 hour(s))  SURGICAL PCR SCREEN     Status: None   Collection Time    06/10/12  7:33 AM      Result Value Range Status   MRSA, PCR NEGATIVE  NEGATIVE Final   Staphylococcus aureus NEGATIVE  NEGATIVE Final   Comment:            The Xpert SA  Assay (FDA     approved for NASAL specimens     in patients over 70 years of age),     is one component of     a comprehensive surveillance     program.  Test performance has     been validated by [employer] for patients greater     than or equal to 66 year old.     It is not intended     to diagnose infection nor to     guide or monitor treatment.  CULTURE, ROUTINE-ABSCESS     Status: None   Collection Time    06/10/12  9:25 AM      Result Value Range Status   Specimen Description ABSCESS RIGHT PERI NEPHRIC ABSCESS   Final   Special Requests NONE   Final   Gram Stain     Final   Value: ABUNDANT WBC PRESENT,BOTH PMN AND MONONUCLEAR     NO SQUAMOUS EPITHELIAL CELLS SEEN     RARE GRAM NEGATIVE RODS   Culture ABUNDANT GRAM NEGATIVE RODS   Final  Report Status PENDING   Incomplete   Assessment: She is improving after percutaneous drainage of her right renal abscess. Abscess cultures are growing gram-negative rods which are probably the same Escherichia coli that she's had in her urine on recent urine cultures. Susceptibilities will be available in the morning. If the isolate remains susceptible to cephalexin I would switch to oral cephalexin 500 mg 4 times daily and treat for at least 3 more weeks. I will arrange followup in my clinic after discharge.  Plan: 1. Consider changing to oral cephalexin tomorrow and treating her 3 more weeks 2. Monitor drain output and repeat CT scan once it is less than 10 cc daily  Cliffton Asters, MD [facility] [phone] pager   [phone] cell 06/12/2012, 2:30 PM

## 2012-06-12 NOTE — Progress Notes (Signed)
Patient evaluated for long-term disease management services with [facility] as a benefit of her [employer]. Spoke with patient at bedside to explain services and also called son, Algis Downs as well. Both interested in [facility] Care Management services. Consents signed at bedside. Patient will receive a post discharge transition of care call and monthly home visits for assessments and for education. Left [facility] Care Management packet at bedside.    Hal Morales, [facility], [phone]

## 2012-06-12 NOTE — Progress Notes (Signed)
[facility] PROGRESS NOTE  ATLEIGH GRUEN JXB:[id number] DOB: 1925/01/26 DOA: 06/06/2012 PCP: [name], MD Primary Urologist: Dr. Annabell Howells  Brief narrative [name] is a 79 y.o. female with PMH of HTN, right renal mass s/p cryoablation (biopsy demonstrated benign lesion), who presented to the ED on 06/06/12 with hypotension and weakness. Per the patient her BP was low at her urologists office on 4/1 and so her daughter called her PCP who sent her to the ED. She has had generalized weakness for the past 3 weeks associated with falls at home. Additionally since the procedure she has been having recurrent bouts of urinary tract infection with what would appear to be the same E.Coli organism each time (same sensitivities and resistances), this despite multiple courses of Abx therapy.  In the ED patient noted to initially be hypotensive though her BP improved with fluids and she appeared more dry initially than septic. UA again reveals findings consistent with UTI, she was started on rocephin and hospitalist has been asked to admit.   Assessment/Plan: 1. 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 OP Urine culture: Escherichia coli sensitive to Rocephin. Patient on Rocephin since admission. Infectious disease consultation appreciated. S/P CT guided drain on 4/6-draining bloody purulent fluid-rare gram-negative rods on Gram stain. Please see interventional radiology PA note on 4/8 regarding management and followup of the drain. Per ID, based on susceptibility-consider changing to oral cephalexin on 4/9 and treat her for 3 more weeks. 2. Type II DM: Continue insulins. Reasonable inpatient control.  3. Anemia and thrombocytopenia: Stable 4. Hyponatremia:? Chronic: Sodium counts decreased from 130s >128.? SIADH. Clinically looks euvolemic. Stable.  5. Chronic steroid  use: Unclear indication. Continue home dose of prednisone. 6. SIRS, present on admission: Secondary to UTI/Right Perinephric abscess. Resolved. Continue  IV Rocephin  7. Hypotension: Secondary to SIRS versus dehydration. Resolved  8. Acute renal failure: Possibly from dehydration. Resolved.  Code Status:  Full Family Communication:  Discussed with patient Disposition Plan:  Home when medically stable. Patient and family declined SNF.   Consultants:   Infectious disease    Interventional radiology  Procedures:   S/P right retroperitoneal abscess drain on 06/10/12  Antibiotics:   IV Rocephin 4/3 >   HPI/Subjective:  Denies complaints. Denies pain.  Objective: Filed Vitals:   06/11/12 2100 06/12/12 0500 06/12/12 0628 06/12/12 1305  BP: 101/70  121/84 121/80  Pulse: 90  99 89  Temp: 98.1 F (36.7 C)  97.5 F (36.4 C) 98 F (36.7 C)  TempSrc: Oral  Oral Oral  Resp: 16  16 17   Height:      Weight:  66.543 kg (146 lb 11.2 oz)    SpO2: 96%  98% 99%    Intake/Output Summary (Last 24 hours) at 06/12/12 1816 Last data filed at 06/12/12 1751  Gross per 24 hour  Intake    470 ml  Output   1630 ml  Net  -1160 ml   Filed Weights   06/10/12 0508 06/11/12 0648 06/12/12 0500  Weight: 66.544 kg (146 lb 11.3 oz) 66.6 kg (146 lb 13.2 oz) 66.543 kg (146 lb 11.2 oz)    Exam:   General exam: Comfortable.   Respiratory system: Clear. No increased work of breathing.  Cardiovascular system: S1 & S2 heard, RRR. No JVD, murmurs, gallops, clicks or pedal edema.   Gastrointestinal system: Abdomen is nondistended, soft and nontender. Normal bowel  sounds heard. Right RP drain-bloody purulent drainage.  Central nervous system: Alert and oriented. No focal neurological deficits.  Extremities: Symmetric 5 x 5 power.   Data Reviewed: Basic Metabolic Panel:  Recent Labs Lab 06/07/12 0515 06/08/12 0425 06/09/12 0522 06/10/12 0514 06/11/12 0457  NA 133* 132* 133* 128* 132*  K  4.3 4.2 4.1 4.3 4.4  CL 98 97 97 93* 95*  CO2 27 29 29 29 28   GLUCOSE 131* 170* 126* 194* 133*  BUN 35* 24* 23 23 18   CREATININE 1.13* 0.86 0.81 0.79 0.70  CALCIUM 9.0 9.1 9.4 9.5 9.5   Liver Function Tests: No results found for this basename: AST, ALT, ALKPHOS, BILITOT, PROT, ALBUMIN,  in the last 168 hours No results found for this basename: LIPASE, AMYLASE,  in the last 168 hours No results found for this basename: AMMONIA,  in the last 168 hours CBC:  Recent Labs Lab 06/06/12 2153 06/07/12 0515 06/08/12 0425 06/09/12 0522 06/10/12 0514  WBC 11.4* 8.8 9.2 9.4 10.6*  NEUTROABS 9.2*  --   --   --   --   HGB 12.2 10.1* 11.2* 10.7* 11.6*  HCT 36.3 29.9* 32.7* 31.8* 34.5*  MCV 89.9 90.6 89.8 90.1 90.1  PLT 176 136* 129* 122* 125*   Cardiac Enzymes:  Recent Labs Lab 06/06/12 2153  CKTOTAL 38   BNP (last 3 results)  Recent Labs  03/20/12 1008  PROBNP 1166.0*   CBG:  Recent Labs Lab 06/11/12 1655 06/11/12 2155 06/12/12 0758 06/12/12 1210 06/12/12 1705  GLUCAP 192* 185* 150* 249* 185*    Recent Results (from the past 240 hour(s))  SURGICAL PCR SCREEN     Status: None   Collection Time    06/10/12  7:33 AM      Result Value Range Status   MRSA, PCR NEGATIVE  NEGATIVE Final   Staphylococcus aureus NEGATIVE  NEGATIVE Final   Comment:            The Xpert SA Assay (FDA     approved for NASAL specimens     in patients over 33 years of age),     is one component of     a comprehensive surveillance     program.  Test performance has     been validated by [employer] for patients greater     than or equal to 48 year old.     It is not intended     to diagnose infection nor to     guide or monitor treatment.  CULTURE, ROUTINE-ABSCESS     Status: None   Collection Time    06/10/12  9:25 AM      Result Value Range Status   Specimen Description ABSCESS RIGHT PERI NEPHRIC ABSCESS   Final   Special Requests NONE   Final   Gram Stain     Final   Value:  ABUNDANT WBC PRESENT,BOTH PMN AND MONONUCLEAR     NO SQUAMOUS EPITHELIAL CELLS SEEN     RARE GRAM NEGATIVE RODS   Culture ABUNDANT GRAM NEGATIVE RODS   Final   Report Status PENDING   Incomplete     Studies: No results found.   Additional labs:   Scheduled Meds: . amitriptyline  50 mg Oral QHS  . aspirin EC  325 mg Oral Daily  . cefTRIAXone (ROCEPHIN)  IV  1 g Intravenous Q24H  . feeding supplement  237 mL Oral BID BM  . gabapentin  300 mg Oral TID  . heparin  5,000 Units Subcutaneous Q8H  . insulin aspart  0-15 Units Subcutaneous TID WC  . insulin detemir  10 Units Subcutaneous QHS  . multivitamin with minerals  1 tablet Oral Daily  . predniSONE  3 mg Oral q morning - 10a  . sodium chloride  3 mL Intravenous Q12H   Continuous Infusions:   Principal Problem:   Renal abscess, right Active Problems:   Diabetes mellitus   Renal oncocytoma   Anemia   Retroperitoneal bleed   Recurrent UTI   Hypotension   AKI (acute kidney injury)   SIRS (systemic inflammatory response syndrome)   Hyponatremia    Time spent:  30 minutes    [facility]  [facility] Pager [phone].   If 8PM-8AM, please contact night-coverage at [email or web address], password [facility] 06/12/2012, 6:16 PM  LOS: 6 days

## 2012-06-12 NOTE — Progress Notes (Signed)
Subjective: Pt doing well; feels better since drainage procedure  Objective: Vital signs in last 24 hours: Temp:  [97.5 F (36.4 C)-98.1 F (36.7 C)] 98 F (36.7 C) (04/08 1305) Pulse Rate:  [89-99] 89 (04/08 1305) Resp:  [16-17] 17 (04/08 1305) BP: (101-121)/(70-84) 121/80 mmHg (04/08 1305) SpO2:  [96 %-99 %] 99 % (04/08 1305) Weight:  [146 lb 11.2 oz (66.543 kg)] 146 lb 11.2 oz (66.543 kg) (04/08 0500) Last BM Date: 06/08/12  Intake/Output from previous day: 04/07 0701 - 04/08 0700 In: 485 [P.O.:420; IV Piggyback:50] Out: 1430 [Urine:1350; Drains:80] Intake/Output this shift: Total I/O In: 240 [P.O.:240] Out: 520 [Urine:500; Drains:20]  Right RP/perinephric abscess drain intact, insertion site ok,mildly tender; output 20 cc's turbid,brown fluid; cx's pending; gram stain- gm neg rods  Lab Results:   Recent Labs  06/10/12 0514  WBC 10.6*  HGB 11.6*  HCT 34.5*  PLT 125*   BMET  Recent Labs  06/10/12 0514 06/11/12 0457  NA 128* 132*  K 4.3 4.4  CL 93* 95*  CO2 29 28  GLUCOSE 194* 133*  BUN 23 18  CREATININE 0.79 0.70  CALCIUM 9.5 9.5   PT/INR  Recent Labs  06/10/12 0514  LABPROT 13.1  INR 1.00   ABG No results found for this basename: PHART, PCO2, PO2, HCO3,  in the last 72 hours  Studies/Results: No results found. Results for orders placed during the hospital encounter of 06/06/12  SURGICAL PCR SCREEN     Status: None   Collection Time    06/10/12  7:33 AM      Result Value Range Status   MRSA, PCR NEGATIVE  NEGATIVE Final   Staphylococcus aureus NEGATIVE  NEGATIVE Final   Comment:            The Xpert SA Assay (FDA     approved for NASAL specimens     in patients over 57 years of age),     is one component of     a comprehensive surveillance     program.  Test performance has     been validated by [employer] for patients greater     than or equal to 38 year old.     It is not intended     to diagnose infection nor to   guide or monitor treatment.  CULTURE, ROUTINE-ABSCESS     Status: None   Collection Time    06/10/12  9:25 AM      Result Value Range Status   Specimen Description ABSCESS RIGHT PERI NEPHRIC ABSCESS   Final   Special Requests NONE   Final   Gram Stain     Final   Value: ABUNDANT WBC PRESENT,BOTH PMN AND MONONUCLEAR     NO SQUAMOUS EPITHELIAL CELLS SEEN     RARE GRAM NEGATIVE RODS   Culture ABUNDANT GRAM NEGATIVE RODS   Final   Report Status PENDING   Incomplete    Anti-infectives: Anti-infectives   Start     Dose/Rate Route Frequency Ordered Stop   06/07/12 2200  cefTRIAXone (ROCEPHIN) 1 g in dextrose 5 % 50 mL IVPB     1 g 100 mL/hr over 30 Minutes Intravenous Every 24 hours 06/07/12 0418     06/06/12 2315  cefTRIAXone (ROCEPHIN) 1 g in dextrose 5 % 50 mL IVPB     1 g 100 mL/hr over 30 Minutes Intravenous  Once 06/06/12 2300 06/07/12 0022      Assessment/Plan: s/p  rt RP/perinephric abscess drainage 4/6; check final cx's, monitor labs; check f/u CT within week of placement to assess adequacy of drainage; if pt goes home with drain, recommend flushing with 5 cc's sterile NS daily, record output and change dressing every 1-2 days. Pt can be set up at IR clinic for f/u imaging/visit with Dr. Fredia Sorrow  by calling [phone].  LOS: 6 days    Chisa Kushner,D [facility] 06/12/2012

## 2012-06-13 ENCOUNTER — Other Ambulatory Visit: Payer: Self-pay | Admitting: Radiology

## 2012-06-13 DIAGNOSIS — K6819 Other retroperitoneal abscess: Secondary | ICD-10-CM

## 2012-06-13 LAB — GLUCOSE, CAPILLARY
Glucose-Capillary: 140 mg/dL — ABNORMAL HIGH (ref 70–99)
Glucose-Capillary: 184 mg/dL — ABNORMAL HIGH (ref 70–99)

## 2012-06-13 LAB — CULTURE, ROUTINE-ABSCESS

## 2012-06-13 MED ORDER — CEPHALEXIN 500 MG PO CAPS: 500.0000 mg | ORAL_CAPSULE | Freq: Three times a day (TID) | ORAL | Status: DC

## 2012-06-13 MED ORDER — HYDROCODONE-ACETAMINOPHEN 5-325 MG PO TABS: 1.0000 | ORAL_TABLET | Freq: Three times a day (TID) | ORAL | Status: DC | PRN

## 2012-06-13 MED ORDER — CEPHALEXIN 500 MG PO TABS: 500.0000 mg | ORAL_TABLET | Freq: Three times a day (TID) | ORAL | Status: DC

## 2012-06-13 MED ORDER — ADULT MULTIVITAMIN W/MINERALS CH: 1.0000 | ORAL_TABLET | Freq: Every day | ORAL | Status: DC

## 2012-06-13 MED ORDER — CEPHALEXIN 500 MG PO CAPS
500.0000 mg | ORAL_CAPSULE | Freq: Four times a day (QID) | ORAL | Status: DC
  Administered 2012-06-13 (×2): 500 mg via ORAL
  Filled 2012-06-13 (×5): qty 1

## 2012-06-13 NOTE — Progress Notes (Signed)
Drain intact, output slowly trending down Will set up CT for early next week to reassess drain. We will call her with appt date/time. Will need drain flushed once daily with 5cc NS  Barbee Shropshire, Alizon Schmeling PA-C

## 2012-06-13 NOTE — Discharge Summary (Signed)
[facility]                                                                                   [name], is a 79 y.o. female  DOB 09/28/1924  MRN [id number].  Admission date:  06/06/2012  Discharge Date:  06/13/2012  Primary MD  [name], MD  Admitting Physician  Hillary Bow, DO  Admission Diagnosis  Dehydration [276.51] UTI (urinary tract infection) [599.0] Recurrent UTI [599.0] AKI (acute kidney injury) [584.9] Diabetes mellitus [250.00] Hypotension [458.9]  Discharge Diagnosis     Principal Problem:   Renal abscess, right Active Problems:   Diabetes mellitus   Renal oncocytoma   Anemia   Retroperitoneal bleed   Recurrent UTI   Hypotension   AKI (acute kidney injury)   SIRS (systemic inflammatory response syndrome)   Hyponatremia    Past Medical History  Diagnosis Date  . Diabetes mellitus   . HTN (hypertension)   . Diabetes mellitus   . Cataract   . Osteoporosis   . 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 Carpal tunnel syndrome of right wrist 02/09/2011  . TIA (transient ischemic attack) 02/09/2011  . Right renal mass   . COPD (chronic obstructive pulmonary disease)     no inhalers used   . Pneumonia dec 2012  . CHF (congestive heart failure)   . Congestive heart failure   . Arthritis     ra  . Degenerative arthritis of cervical spine 02/09/2011  . Degenerative arthritis of hip 02/09/2011  . Degenerative arthritis of knee 02/09/2011  . PONV (postoperative nausea and vomiting)     in past, none recently  . Macular degeneration     both eyes    Past Surgical History  Procedure Laterality Date  . Back surgery  6 yrs ago    lower  . Eye surgery  yrs ago    both eyes cataract surgery  . Joint replacement  10 yrs ago    left hip, both knees replaced one 3 yrs ago, one 6 yrs ago  . Upper neck surgery  8 yrs ago  . Right carpal tunnel release  5 yrs go  . Right arm plate  5 yrs ago  .  Both elbows surgery  10-12 yrs ago     Recommendations for primary care physician for things to follow:    follow BMP closely   Discharge Diagnoses:   Principal Problem:   Renal abscess, right Active Problems:   Diabetes mellitus   Renal oncocytoma   Anemia   Retroperitoneal bleed   Recurrent UTI   Hypotension   AKI (acute kidney injury)   SIRS (systemic inflammatory response syndrome)   Hyponatremia    Discharge Condition: Stable   Diet recommendation: See Discharge Instructions below   Consults ID,IR    History of present illness and  Hospital Course:     Kindly see H&P for history of present illness and admission details, please review complete Labs, Consult reports and Test reports for all details in brief [name], is a 79 y.o. female, with PMH of HTN, right renal mass s/p  cryoablation (biopsy demonstrated benign lesion), who presented to the ED on 06/06/12 with hypotension and weakness. Per the patient her BP was low at her urologists office on 4/1 and so her daughter called her PCP who sent her to the ED. She has had generalized weakness for the past 3 weeks associated with falls at home. Additionally since the procedure she has been having recurrent bouts of urinary tract infection with what would appear to be the same E.Coli organism each time (same sensitivities and resistances), this despite multiple courses of Abx therapy.   In the ED patient noted to initially be hypotensive though her BP improved with fluids and she appeared more dry initially than septic. UA again reveals findings consistent with UTI, she was started on rocephin and hospitalist has been asked to admit.      1. 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 OP Urine culture: Escherichia coli sensitive to Rocephin. Patient on Rocephin since admission. Infectious  disease consultation appreciated. S/P CT guided drain on 4/6-draining bloody purulent fluid-rare gram-negative rods on Gram stain. Please see interventional radiology PA note on 4/8 regarding management and followup of the drain. Per ID, based on susceptibility-consider changing to oral cephalexin on 4/9 and treat her for 3 more weeks.   2. Type II DM: Continue home dose insulins. Reasonable inpatient control. Followup with PCP   3. Anemia and thrombocytopenia: Stable   4. Hyponatremia:? Chronic: Sodium counts decreased from 130s >128.? SIADH. Clinically looks euvolemic. Stable. Home dose diuretics will be resumed, we'll request PCP to monitor BMP closely.   5. Chronic steroid use: Unclear indication. Continue home dose of prednisone.   6. SIRS, present on admission: Secondary to UTI/Right Perinephric abscess. Resolved.    7. Hypotension: Secondary to SIRS versus dehydration. Resolved    8. Acute renal failure: Possibly from dehydration. Resolved. This point home dose ACE inhibitor and diuretics will be resumed. We'll request PCP to monitor fluid status and BMP very closely.            Today   Subjective:   [name] today has no headache,no chest abdominal pain,no new weakness tingling or numbness, feels much better wants to go home today.    Objective:   Blood pressure 114/75, pulse 95, temperature 97.3 F (36.3 C), temperature source Oral, resp. rate 19, height 5\' 2"  (1.575 m), weight 66.4 kg (146 lb 6.2 oz), SpO2 97.00%.   Intake/Output Summary (Last 24 hours) at 06/13/12 1230 Last data filed at 06/13/12 0900  Gross per 24 hour  Intake    443 ml  Output   1503 ml  Net  -1060 ml    Exam Awake Alert, Oriented *3, No new F.N deficits, Normal affect [place].AT,PERRAL Supple Neck,No JVD, No cervical lymphadenopathy appriciated.  Symmetrical Chest wall movement, Good air movement bilaterally, CTAB RRR,No Gallops,Rubs or new Murmurs, No Parasternal Heave +ve  B.Sounds, Abd Soft, Non tender, No organomegaly appriciated, No rebound -guarding or rigidity. No Cyanosis, Clubbing or edema, No new Rash or bruise  Data Review   Major procedures and Radiology Reports - PLEASE review detailed and final reports for all details in brief -      Dg Chest 2 View  05/17/2012  *RADIOLOGY REPORT*  Clinical Data: Fall with left hip pain and cough.  CHEST - 2 VIEW  Comparison: 05/14/2012.  Findings: Trachea is midline.  Heart size stable.  Descending thoracic aorta  is tortuous.  Scarring or atelectasis at the left lung base.  Lungs are otherwise clear.  No pleural fluid.  Upper lumbar compression fracture and kyphosis are unchanged from 03/20/2012.  There appears to be a mid thoracic compression fracture as well, which appears new.  IMPRESSION:  1.  Left basilar atelectasis or scarring. 2.  Mid thoracic compression fracture appears new from 03/20/2012.   Original Report Authenticated By: Leanna Battles, M.D.    Dg Ribs Unilateral W/chest Left  05/15/2012  *RADIOLOGY REPORT*  Clinical Data: Larey Seat last night with pain  LEFT RIBS AND CHEST - 3+ VIEW  Comparison: Portable chest x-ray of 03/21/2012  Findings: There is mild linear atelectasis or scarring at the lung bases.  No pneumothorax is seen.  There is cardiomegaly present.  The left rib detail films do show an old fracture of the lateral left tenth rib but no acute rib fracture is seen with certainty.  IMPRESSION:  1.  Basilar linear atelectasis left greater than right.  No pneumothorax. 2.  No definite acute left rib fracture.   Original Report Authenticated By: Dwyane Dee, M.D.    Dg Hip Complete Left  05/17/2012  *RADIOLOGY REPORT*  Clinical Data: Fall with left hip pain.  LEFT HIP - COMPLETE 2+ VIEW  Comparison: 05/14/2012.  Findings: Left hip arthroplasty without complicating feature. Minimal degenerative change in the right hip.  Obturator rings are intact.  IMPRESSION: Left hip arthroplasty.  No acute findings.    Original Report Authenticated By: Leanna Battles, M.D.    Dg Hip Complete Left  05/15/2012  *RADIOLOGY REPORT*  Clinical Data: History of injury from fall.  History of pain.  LEFT HIP - COMPLETE 2+ VIEW  Comparison: CT 03/20/2012.  Findings: Previous left hip arthroplasty has been performed. Hardware is in place without evidence of disruption.  No fracture dislocation is seen.  Pelvic phleboliths are present.  There is slightly osteopenic appearance of bones.  There is narrowing of the right hip joint space with minimal spurring.  No calcific bursitis is seen.  IMPRESSION: Post left hip arthroplasty.  No disruption of hardware is evident. No fracture or dislocation.  Osteopenic appearance of bones.   Original Report Authenticated By: Onalee Hua Call    Ct Head Wo Contrast  05/17/2012  *RADIOLOGY REPORT*  Clinical Data: Fall, weakness.  CT HEAD WITHOUT CONTRAST  Technique:  Contiguous axial images were obtained from the base of the skull through the vertex without contrast.  Comparison: 03/23/2012  Findings: Mild age related volume loss. No acute intracranial abnormality.  Specifically, no hemorrhage, hydrocephalus, mass lesion, acute infarction, or significant intracranial injury.  No acute calvarial abnormality.  Opacification of the left maxillary sinus is stable.  Orbital soft tissues unremarkable.  IMPRESSION: No acute intracranial abnormality.   Original Report Authenticated By: Charlett Nose, M.D.    Ct Guided Abscess Drain  06/10/2012  *RADIOLOGY REPORT*  Clinical data:  Right perinephric abscess post renal cryoablation.  CT-GUIDED RETROPERITONEAL ABSCESS DRAINAGE CATHETER PLACEMENT  Technique and findings: The procedure, risks (including but not limited to bleeding, infection, organ damage), benefits, and alternatives were explained to the patient.  Questions regarding the procedure were encouraged and answered.  The patient understands and consents to the procedure.The patient was placed prone on the CT  gantry and limited   axial scans through the mid abdomen obtained.  The collection was localized and an appropriate skin entry site determined. Operator donned sterile gloves and mask.   Site was marked, prepped with Betadine,  draped in usual sterile fashion, infiltrated locally with 1% lidocaine.  Intravenous Fentanyl and Versed were administered as conscious sedation during continuous cardiorespiratory monitoring by the radiology RN, with a total moderate sedation time of less than 30 minutes.  Under CT fluoroscopic guidance, a 19 gauge percutaneous entry needle was advanced into the collection.  Purulent bloody material could be aspirated.  An Amplatz guide wire advanced easily within the collection.  Tract was dilated to allow placement 12-French pigtail catheter, formed within the dominant component of the collection.  Catheter position was confirmed on CT.  Catheter was secured externally with O-Prolene suture and Statlock, and placed to gravity drain bag.  20 ml of bloody purulent output were aspirated, a sample sent for routine Gram stain and culture. The patient tolerated the procedure well.  No immediate complication.  IMPRESSION: Technically successful CT-guided retroperitoneal abscess drainage catheter placement.   Original Report Authenticated By: D. Andria Rhein, MD    Ct Abdomen Pelvis W Contrast  06/08/2012  *RADIOLOGY REPORT*  Clinical Data: Status post cryoablation of a right renal oncocytoma on 03/16/2012.  Postprocedural imaging demonstrated development of postoperative retroperitoneal hemorrhage.  The patient has been treated for recent urinary tract infection and has had hypotension and weakness.  CT ABDOMEN AND PELVIS WITH CONTRAST  Technique:  Multidetector CT imaging of the abdomen and pelvis was performed following the standard protocol during bolus administration of intravenous contrast.  Contrast: OMNIPAQUE IOHEXOL 300 MG/ML  SOLN, 50mL OMNIPAQUE IOHEXOL 300 MG/ML  SOLN   Comparison: 05/09/2012 and 03/20/2012.  Findings: Posterior to the right kidney at the level of previously demonstrated retracting postprocedural hematoma, a more liquefied collection is now present which is larger than the most recent demonstrated resolving hematoma.  This area appears multiloculated and measures roughly 6 cm and greatest transverse diameter.  On reconstructions, the height of the total area of the abnormality approaches 9 cm.  Findings are suspicious for development of an abscess at the site of prior postprocedural hemorrhage.  No air is seen in the collection.  The process abuts the kidney but does not obviously involve the renal parenchyma itself.  An ablation defect is not again identified posteriorly at the level of the treated right renal neoplasm.  There is a stable upper pole left renal cyst.  Stable calcified gallstones are identified.  No evidence of ascites.  Bowel loops are unremarkable.  No hernias are seen.  The liver, spleen, pancreas and adrenal glands are unremarkable.  IMPRESSION: Development of a larger and more liquefied collection posterior to the right kidney in the retroperitoneal space.  This is at the level of the previously demonstrated retracting postprocedural perinephric hematoma after cryoablation of a right renal neoplasm in January.  Findings are suspicious for development of abscess/infected hematoma.  Percutaneous aspiration/drainage may be appropriate.   Original Report Authenticated By: Irish Lack, M.D.     Micro Results      Recent Results (from the past 240 hour(s))  SURGICAL PCR SCREEN     Status: None   Collection Time    06/10/12  7:33 AM      Result Value Range Status   MRSA, PCR NEGATIVE  NEGATIVE Final   Staphylococcus aureus NEGATIVE  NEGATIVE Final   Comment:            The Xpert SA Assay (FDA     approved for NASAL specimens     in patients over 9 years of age),     is one component  of     a comprehensive surveillance      program.  Test performance has     been validated by [facility] for patients greater     than or equal to 74 year old.     It is not intended     to diagnose infection nor to     guide or monitor treatment.  CULTURE, ROUTINE-ABSCESS     Status: None   Collection Time    06/10/12  9:25 AM      Result Value Range Status   Specimen Description ABSCESS RIGHT PERI NEPHRIC ABSCESS   Final   Special Requests NONE   Final   Gram Stain     Final   Value: ABUNDANT WBC PRESENT,BOTH PMN AND MONONUCLEAR     NO SQUAMOUS EPITHELIAL CELLS SEEN     RARE GRAM NEGATIVE RODS   Culture ABUNDANT ESCHERICHIA COLI   Final   Report Status 06/13/2012 FINAL   Final   Organism ID, Bacteria ESCHERICHIA COLI   Final     CBC w Diff: Lab Results  Component Value Date   WBC 10.6* 06/10/2012   WBC 14.9* 05/30/2012   WBC 8.8 04/04/2006   HGB 11.6* 06/10/2012   HGB 12.8 05/30/2012   HGB 12.2 04/04/2006   HCT 34.5* 06/10/2012   HCT 41.4 05/30/2012   HCT 37.4 04/04/2006   PLT 125* 06/10/2012   PLT 181 04/04/2006   LYMPHOPCT 11* 06/06/2012   LYMPHOPCT 23.6 04/04/2006   MONOPCT 8 06/06/2012   MONOPCT 8.1 04/04/2006   EOSPCT 0 06/06/2012   EOSPCT 3.5 04/04/2006   BASOPCT 0 06/06/2012   BASOPCT 0.7 04/04/2006    CMP: Lab Results  Component Value Date   NA 132* 06/11/2012   K 4.4 06/11/2012   CL 95* 06/11/2012   CO2 28 06/11/2012   BUN 18 06/11/2012   CREATININE 0.70 06/11/2012   CREATININE 1.89* 05/30/2012   PROT 7.0 05/30/2012   ALBUMIN 3.9 05/30/2012   BILITOT 0.4 05/30/2012   ALKPHOS 121* 05/30/2012   AST 20 05/30/2012   ALT 17 05/30/2012  .   Discharge Instructions     Retroperitoneal drain care instructions - Will need drain flushed once daily with 5cc NS   Follow with Primary MD GREEN, Lenon Curt, MD in 2 days   Get CBC, CMP, checked 2 days by Primary MD and again as instructed by your Primary MD.    Get Medicines reviewed and adjusted.  Please request your Prim.MD to go over all Hospital Tests and  Procedure/Radiological results at the follow up, please get all Hospital records sent to your Prim MD by signing hospital release before you go home.  Activity: As tolerated with Full fall precautions use walker/cane & assistance as needed   Diet:  Heart Healthy-low carbohydrate  Accuchecks 4 times/day, Once in AM empty stomach and then before each meal. Log in all results and show them to your Prim.MD in 3 days. If any glucose reading is under 80 or above 300 call your Prim MD immidiately. Follow Low glucose instructions for glucose under 80 as instructed.   For Heart failure patients - Check your Weight same time everyday, if you gain over 2 pounds, or you develop in leg swelling, experience more shortness of breath or chest pain, call your Primary MD immediately. Follow Cardiac Low Salt Diet and 1.8 lit/day fluid restriction.  Disposition Home,  patient refused placement  If you experience worsening of  your admission symptoms, develop shortness of breath, life threatening emergency, suicidal or homicidal thoughts you must seek medical attention immediately by calling 911 or calling your MD immediately  if symptoms less severe.  You Must read complete instructions/literature along with all the possible adverse reactions/side effects for all the Medicines you take and that have been prescribed to you. Take any new Medicines after you have completely understood and accpet all the possible adverse reactions/side effects.   Do not drive and provide baby sitting services if your were admitted for syncope or siezures until you have seen by Primary MD or a Neurologist and advised to do so again.  Do not drive when taking Pain medications.    Do not take more than prescribed Pain, Sleep and Anxiety Medications  Special Instructions: If you have smoked or chewed Tobacco  in the last 2 yrs please stop smoking, stop any regular Alcohol  and or any Recreational drug use.  Wear Seat belts while  driving.       Follow-up Information   Follow up with GREEN, Lenon Curt, MD. Schedule an appointment as soon as possible for a visit in 2 days.   Contact information:   [address] [place] [address] [phone]       Follow up with Cliffton Asters, MD. Schedule an appointment as soon as possible for a visit in 1 week. (Or any other infectious disease physician in his office)    Contact information:   [address] [place] [address] [phone]         Discharge Medications     Medication List    STOP taking these medications       naproxen sodium 220 MG tablet  Commonly known as:  ANAPROX      TAKE these medications       amitriptyline 50 MG tablet  Commonly known as:  ELAVIL  Take 50 mg by mouth at bedtime.     aspirin EC 325 MG tablet  Take 325 mg by mouth daily.     Cephalexin 500 MG tablet  Take 1 tablet (500 mg total) by mouth 3 (three) times daily.     enalapril 5 MG tablet  Commonly known as:  VASOTEC  Take 5 mg by mouth every morning.     furosemide 40 MG tablet  Commonly known as:  LASIX  Take 1 tablet (40 mg total) by mouth daily.     gabapentin 300 MG capsule  Commonly known as:  NEURONTIN  Take 300 mg by mouth 3 (three) times daily.     HYDROcodone-acetaminophen 5-325 MG per tablet  Commonly known as:  NORCO/VICODIN  Take 1 tablet by mouth every 8 (eight) hours as needed.     insulin aspart 100 UNIT/ML injection  Commonly known as:  novoLOG  Inject 0-15 Units into the skin as needed. Sugar level less than 150 do not take any Novolog, if sugar level is between 151-200 take 3 units, 201-250 take 5 units, 251-300 take 7 units 301-350 take 9 units, 351-400 take 11 units, sugar level over 400 take 15 units and notify docotor     insulin detemir 100 UNIT/ML injection  Commonly known as:  LEVEMIR  Inject 30 Units into the skin at bedtime.     metoprolol tartrate 12.5 mg Tabs  Commonly known as:  LOPRESSOR  Take  12.5 mg by mouth 2 (two) times daily.     multivitamin with minerals Tabs  Take  1 tablet by mouth daily.     multivitamins ther. w/minerals Tabs  Take 1 tablet by mouth daily.     oxybutynin 5 MG tablet  Commonly known as:  DITROPAN  Take 5 mg by mouth 3 (three) times daily as needed. For muscle spasms     predniSONE 1 MG tablet  Commonly known as:  DELTASONE  Take 3 mg by mouth every morning.     traMADol 50 MG tablet  Commonly known as:  ULTRAM  Take 50 mg by mouth every 8 (eight) hours as needed. Pain           Total Time in preparing paper work, data evaluation and todays exam - 35 minutes  Leroy Sea M.D on 06/13/2012 at 12:30 PM  [facility] Office  [phone]

## 2012-06-14 DIAGNOSIS — E119 Type 2 diabetes mellitus without complications: Secondary | ICD-10-CM

## 2012-06-14 DIAGNOSIS — R269 Unspecified abnormalities of gait and mobility: Secondary | ICD-10-CM

## 2012-06-14 DIAGNOSIS — M6281 Muscle weakness (generalized): Secondary | ICD-10-CM

## 2012-06-17 ENCOUNTER — Other Ambulatory Visit (HOSPITAL_COMMUNITY): Payer: Self-pay | Admitting: Internal Medicine

## 2012-06-19 ENCOUNTER — Ambulatory Visit (INDEPENDENT_AMBULATORY_CARE_PROVIDER_SITE_OTHER): Payer: Medicare Other | Admitting: Internal Medicine

## 2012-06-19 ENCOUNTER — Encounter: Payer: Self-pay | Admitting: Internal Medicine

## 2012-06-19 VITALS — BP 126/70 | HR 64 | Temp 97.7°F | Resp 20 | Ht 60.0 in | Wt 152.0 lb

## 2012-06-19 DIAGNOSIS — M316 Other giant cell arteritis: Secondary | ICD-10-CM | POA: Insufficient documentation

## 2012-06-19 DIAGNOSIS — R942 Abnormal results of pulmonary function studies: Secondary | ICD-10-CM | POA: Insufficient documentation

## 2012-06-19 DIAGNOSIS — M79606 Pain in leg, unspecified: Secondary | ICD-10-CM | POA: Insufficient documentation

## 2012-06-19 DIAGNOSIS — B029 Zoster without complications: Secondary | ICD-10-CM | POA: Insufficient documentation

## 2012-06-19 DIAGNOSIS — M1990 Unspecified osteoarthritis, unspecified site: Secondary | ICD-10-CM

## 2012-06-19 DIAGNOSIS — J441 Chronic obstructive pulmonary disease with (acute) exacerbation: Secondary | ICD-10-CM | POA: Insufficient documentation

## 2012-06-19 DIAGNOSIS — IMO0001 Reserved for inherently not codable concepts without codable children: Secondary | ICD-10-CM | POA: Insufficient documentation

## 2012-06-19 DIAGNOSIS — M1710 Unilateral primary osteoarthritis, unspecified knee: Secondary | ICD-10-CM

## 2012-06-19 DIAGNOSIS — D649 Anemia, unspecified: Secondary | ICD-10-CM

## 2012-06-19 DIAGNOSIS — M545 Low back pain, unspecified: Secondary | ICD-10-CM

## 2012-06-19 DIAGNOSIS — I6990 Unspecified sequelae of unspecified cerebrovascular disease: Secondary | ICD-10-CM

## 2012-06-19 DIAGNOSIS — R05 Cough: Secondary | ICD-10-CM | POA: Insufficient documentation

## 2012-06-19 DIAGNOSIS — M76899 Other specified enthesopathies of unspecified lower limb, excluding foot: Secondary | ICD-10-CM

## 2012-06-19 DIAGNOSIS — K648 Other hemorrhoids: Secondary | ICD-10-CM | POA: Insufficient documentation

## 2012-06-19 DIAGNOSIS — E785 Hyperlipidemia, unspecified: Secondary | ICD-10-CM

## 2012-06-19 DIAGNOSIS — G609 Hereditary and idiopathic neuropathy, unspecified: Secondary | ICD-10-CM | POA: Insufficient documentation

## 2012-06-19 DIAGNOSIS — IMO0002 Reserved for concepts with insufficient information to code with codable children: Secondary | ICD-10-CM

## 2012-06-19 DIAGNOSIS — E138 Other specified diabetes mellitus with unspecified complications: Secondary | ICD-10-CM

## 2012-06-19 DIAGNOSIS — F411 Generalized anxiety disorder: Secondary | ICD-10-CM

## 2012-06-19 DIAGNOSIS — E1065 Type 1 diabetes mellitus with hyperglycemia: Secondary | ICD-10-CM | POA: Insufficient documentation

## 2012-06-19 DIAGNOSIS — E1365 Other specified diabetes mellitus with hyperglycemia: Secondary | ICD-10-CM | POA: Insufficient documentation

## 2012-06-19 DIAGNOSIS — K661 Hemoperitoneum: Secondary | ICD-10-CM | POA: Insufficient documentation

## 2012-06-19 DIAGNOSIS — F070 Personality change due to known physiological condition: Secondary | ICD-10-CM | POA: Insufficient documentation

## 2012-06-19 DIAGNOSIS — M25569 Pain in unspecified knee: Secondary | ICD-10-CM | POA: Insufficient documentation

## 2012-06-19 DIAGNOSIS — I10 Essential (primary) hypertension: Secondary | ICD-10-CM

## 2012-06-19 DIAGNOSIS — R32 Unspecified urinary incontinence: Secondary | ICD-10-CM

## 2012-06-19 DIAGNOSIS — M7120 Synovial cyst of popliteal space [Baker], unspecified knee: Secondary | ICD-10-CM | POA: Insufficient documentation

## 2012-06-19 DIAGNOSIS — R269 Unspecified abnormalities of gait and mobility: Secondary | ICD-10-CM

## 2012-06-19 DIAGNOSIS — Z9181 History of falling: Secondary | ICD-10-CM | POA: Insufficient documentation

## 2012-06-19 DIAGNOSIS — M542 Cervicalgia: Secondary | ICD-10-CM

## 2012-06-19 DIAGNOSIS — B351 Tinea unguium: Secondary | ICD-10-CM | POA: Insufficient documentation

## 2012-06-19 DIAGNOSIS — C659 Malignant neoplasm of unspecified renal pelvis: Secondary | ICD-10-CM

## 2012-06-19 DIAGNOSIS — F329 Major depressive disorder, single episode, unspecified: Secondary | ICD-10-CM | POA: Insufficient documentation

## 2012-06-19 DIAGNOSIS — Z96659 Presence of unspecified artificial knee joint: Secondary | ICD-10-CM | POA: Insufficient documentation

## 2012-06-19 DIAGNOSIS — R350 Frequency of micturition: Secondary | ICD-10-CM

## 2012-06-19 DIAGNOSIS — B0229 Other postherpetic nervous system involvement: Secondary | ICD-10-CM | POA: Insufficient documentation

## 2012-06-19 DIAGNOSIS — J189 Pneumonia, unspecified organism: Secondary | ICD-10-CM | POA: Insufficient documentation

## 2012-06-19 DIAGNOSIS — M25519 Pain in unspecified shoulder: Secondary | ICD-10-CM | POA: Insufficient documentation

## 2012-06-19 DIAGNOSIS — N390 Urinary tract infection, site not specified: Secondary | ICD-10-CM | POA: Insufficient documentation

## 2012-06-19 DIAGNOSIS — K449 Diaphragmatic hernia without obstruction or gangrene: Secondary | ICD-10-CM | POA: Insufficient documentation

## 2012-06-19 DIAGNOSIS — M6281 Muscle weakness (generalized): Secondary | ICD-10-CM

## 2012-06-19 DIAGNOSIS — R279 Unspecified lack of coordination: Secondary | ICD-10-CM | POA: Insufficient documentation

## 2012-06-19 DIAGNOSIS — R1310 Dysphagia, unspecified: Secondary | ICD-10-CM

## 2012-06-19 DIAGNOSIS — M810 Age-related osteoporosis without current pathological fracture: Secondary | ICD-10-CM | POA: Insufficient documentation

## 2012-06-19 DIAGNOSIS — E119 Type 2 diabetes mellitus without complications: Secondary | ICD-10-CM

## 2012-06-19 NOTE — Patient Instructions (Signed)
Stay off enalapril, furosemide, hydrocodone, one of the mulitvitamins, oxybutynin. Continue other medications.

## 2012-06-20 LAB — BASIC METABOLIC PANEL
Calcium: 10 mg/dL (ref 8.6–10.2)
GFR calc Af Amer: 46 mL/min/{1.73_m2} — ABNORMAL LOW (ref >59)

## 2012-06-21 ENCOUNTER — Ambulatory Visit (INDEPENDENT_AMBULATORY_CARE_PROVIDER_SITE_OTHER): Payer: Medicare Other | Admitting: Internal Medicine

## 2012-06-21 ENCOUNTER — Encounter: Payer: Self-pay | Admitting: Internal Medicine

## 2012-06-21 VITALS — BP 121/80 | HR 98 | Temp 98.0°F | Ht 61.0 in | Wt 145.0 lb

## 2012-06-21 DIAGNOSIS — N151 Renal and perinephric abscess: Secondary | ICD-10-CM

## 2012-06-21 DIAGNOSIS — N390 Urinary tract infection, site not specified: Secondary | ICD-10-CM

## 2012-06-21 NOTE — Progress Notes (Signed)
Patient ID: [name], female   DOB: Oct 29, 1924, 79 y.o.   MRN: [id number]         [facility]  Patient Active Problem List  Diagnosis  . Pneumonia  . Diabetes mellitus  . HTN (hypertension)  . Polymyalgia rheumatica  . Depression  . Renal oncocytoma  . Anemia  . Hypoxia  . Volume overload  . Auto immune neutropenia  . Immune thrombocytopenia  . Degenerative arthritis of cervical spine  . Degenerative arthritis of hip  . Degenerative arthritis of knee  . Carpal tunnel syndrome of right wrist  . TIA (transient ischemic attack)  . Retroperitoneal bleed  . Delirium  . CHF (congestive heart failure)  . Recurrent UTI  . Hypotension  . AKI (acute kidney injury)  . SIRS (systemic inflammatory response syndrome)  . Hyponatremia  . Renal abscess, right  . Hemoperitoneum (nontraumatic)  . Secondary diabetes mellitus with unspecified complication, uncontrolled  . Giant cell arteritis  . Malignant neoplasm of renal pelvis  . Pneumonia, organism unspecified  . Obstructive chronic bronchitis with exacerbation  . Cough  . Dysphagia, unspecified  . Nonspecific abnormal results of pulmonary system function study  . Urinary frequency  . Unspecified late effects of cerebrovascular disease  . Personal history of fall  . Abnormality of gait  . Synovial cyst of popliteal space  . Muscle weakness (generalized)  . Osteoporosis, unspecified  . Anxiety state, unspecified  . Osteoarthrosis, unspecified whether generalized or localized, unspecified site  . Urinary tract infection, site not specified  . Herpes zoster with other nervous system complications  . Anemia, unspecified  . Pain in limb  . Knee joint replacement by other means  . Type I (juvenile type) diabetes mellitus without mention of complication, uncontrolled  . Pain in joint, lower leg  . Other and unspecified hyperlipidemia  . Cervicalgia  . Lack of coordination  . Personality change  due to conditions classified elsewhere  . Unspecified essential hypertension  . Myalgia and myositis, unspecified  . Pain in joint, shoulder region  . Dermatophytosis of nail  . Unspecified urinary incontinence  . Herpes zoster without mention of complication  . Depressive disorder, not elsewhere classified  . Unspecified hereditary and idiopathic peripheral neuropathy  . Osteoarthrosis, unspecified whether generalized or localized, lower leg  . Lumbago  . Enthesopathy of hip region  . Internal hemorrhoids without mention of complication  . Diaphragmatic hernia without mention of obstruction or gangrene    Patient's Medications  New Prescriptions   No medications on file  Previous Medications   AMITRIPTYLINE (ELAVIL) 50 MG TABLET    Take 50 mg by mouth at bedtime.   ASPIRIN EC 325 MG TABLET    Take 325 mg by mouth daily.     CEPHALEXIN 500 MG TABLET    Take 1 tablet (500 mg total) by mouth 3 (three) times daily.   ENALAPRIL (VASOTEC) 5 MG TABLET    Take 5 mg by mouth every morning.    FUROSEMIDE (LASIX) 40 MG TABLET    Take 1 tablet (40 mg total) by mouth daily.   GABAPENTIN (NEURONTIN) 300 MG CAPSULE    Take 300 mg by mouth 3 (three) times daily.   INSULIN ASPART (NOVOLOG) 100 UNIT/ML INJECTION    Inject 0-15 Units into the skin as needed. Sugar level less than 150 do not take any Novolog, if sugar level is between 151-200 take 3 units, 201-250 take 5 units, 251-300 take 7 units  301-350 take 9 units, 351-400 take 11 units, sugar level over 400 take 15 units and notify docotor   INSULIN DETEMIR (LEVEMIR) 100 UNIT/ML INJECTION    Inject 30 Units into the skin at bedtime.    METOPROLOL TARTRATE (LOPRESSOR) 12.5 MG TABS    Take 12.5 mg by mouth 2 (two) times daily.   MULTIPLE VITAMIN (MULTIVITAMIN WITH MINERALS) TABS    Take 1 tablet by mouth daily.   OXYBUTYNIN (DITROPAN) 5 MG TABLET    TAKE ONE TABLET BY MOUTH UP TO THREE TIMES DAILY TO HELP BLADDER CONTROL   PREDNISONE (DELTASONE) 1 MG  TABLET    Take 3 mg by mouth every morning.   TRAMADOL (ULTRAM) 50 MG TABLET    Take 50 mg by mouth every 8 (eight) hours as needed. Pain  Modified Medications   No medications on file  Discontinued Medications   MULTIPLE VITAMINS-MINERALS (MULTIVITAMINS THER. W/MINERALS) TABS    Take 1 tablet by mouth daily.      Subjective: This Heal is in with her son for her hospital followup visit. She underwent ablation of a right renal oncocytoma in January and developed the expected postoperative hematoma. She had been in declining health since that time with recurrent urinary tract infections and progressive weakness. She was hospitalized and found to have a right renal abscess growing the same Escherichia coli she had grown from multiple urine cultures. She is now completed 16 days of antibiotic therapy. A percutaneous drain was placed and continues to drain about 100 cc of purulent fluid daily. She is feeling much better. She's had no fever, chills or sweats. She's had no more falls at home like she had been having recently. She is tolerating her oral cephalexin well.  Objective: Temp: 98 F (36.7 C) (04/17 1125) Temp src: Oral (04/17 1125) BP: 121/80 mmHg (04/17 1125) Pulse Rate: 98 (04/17 1125)  General: She is smiling and in much better spirits Skin: Her drain site appears normal Lungs: Clear Cor: Regular S1 and S2 no murmurs Abdomen: Soft and nontender. No CVA tenderness. There is a small amount of purulent drainage in her drain bag. He emptied it this morning.  Assessment: She is improving on antibiotic therapy after percutaneous drainage of her smoldering right renal abscess. The drain is still putting out a large volume of purulent fluid so it is too soon to repeat her CT scan. I've instructed her son to call me when drain output his less than 10 ccs two days in a row. I will continue current antibiotics and see her back in 2 weeks.  Plan: 1. Continue cephalexin 2. Monitor drain  output and repeat CT scan when drain output declines 3. Followup in 2 weeks   Cliffton Asters, MD [facility] [phone] pager   [phone] cell 06/21/2012, 12:02 PM

## 2012-06-24 ENCOUNTER — Other Ambulatory Visit: Payer: Self-pay | Admitting: Internal Medicine

## 2012-06-25 ENCOUNTER — Other Ambulatory Visit: Payer: Self-pay | Admitting: *Deleted

## 2012-06-25 MED ORDER — MELOXICAM 7.5 MG PO TABS: 7.5000 mg | ORAL_TABLET | Freq: Every day | ORAL | Status: DC

## 2012-06-28 ENCOUNTER — Other Ambulatory Visit: Payer: Self-pay | Admitting: Urology

## 2012-07-02 ENCOUNTER — Other Ambulatory Visit: Payer: Self-pay | Admitting: *Deleted

## 2012-07-02 MED ORDER — MELOXICAM 7.5 MG PO TABS: ORAL_TABLET | ORAL | Status: DC

## 2012-07-06 ENCOUNTER — Encounter (HOSPITAL_BASED_OUTPATIENT_CLINIC_OR_DEPARTMENT_OTHER): Payer: Self-pay | Admitting: *Deleted

## 2012-07-09 ENCOUNTER — Encounter (HOSPITAL_BASED_OUTPATIENT_CLINIC_OR_DEPARTMENT_OTHER): Payer: Self-pay | Admitting: *Deleted

## 2012-07-09 NOTE — H&P (Signed)
ctive Problems Problems  1. Chronic Cystitis 595.2 2. Postmenopausal Atrophic Vaginitis 627.3 3. Renal Abscess Right 590.2 4. Renal Cyst Left 593.2 5. Renal Neoplasm Right 239.5 6. Urge And Stress Incontinence 788.33  History of Present Illness     [name] returns today with a repeat CT for her right renal abscess.  she continues to drain and the fluid is clearing looks more like urine.   She has had about 200cc out in the last 30hrs.   The CT shows the drain in good position adjacent to the kidney with a resolving abscess.  The renal parenchyma appears intact but the study was non-contrast.   She remains on keflex.   Past Medical History Problems  1. History of  Arthritis V13.4 2. History of  Diabetes Mellitus 250.00 3. History of  Diabetic Peripheral Neuropathy 357.2 4. History of  Esophageal Reflux 530.81 5. History of  Herpes Zoster (Shingles) 053.9 6. History of  Skin Cancer V10.83 7. History of  Temporal Arteritis 446.5  Surgical History Problems  1. History of  Biopsy Temporal Artery 2. History of  Knee Replacement 3. History of  Lower Back Surgery 4. History of  Neck Surgery 5. History of  Neuroplasty Median Nerve At Carpal Tunnel 6. History of  Total Hip Replacement  Current Meds 1. Acyclovir TABS; Therapy: (Recorded:16Aug2010) to 2. Aleve TABS; Therapy: (Recorded:16Aug2010) to 3. Amitriptyline HCl 25 MG Oral Tablet; Therapy: (Recorded:16Aug2010) to 4. Amitriptyline HCl 50 MG Oral Tablet; Therapy: 01Oct2012 to 5. Aspirin 325 MG Oral Tablet; Therapy: (Recorded:01Apr2014) to 6. Cephalexin 500 MG Oral Capsule; TAKE 1 CAPSULE Daily; Therapy: 01Apr2014 to  (Evaluate:08Apr2014)  Requested for: 01Apr2014; Last Rx:01Apr2014 7. Enalapril Maleate 10 MG Oral Tablet; Therapy: 17Aug2012 to 8. Gabapentin 300 MG Oral Capsule; Therapy: (Recorded:01Apr2014) to 9. Hydrocodone-Acetaminophen 5-325 MG Oral Tablet; Therapy: 09Apr2014 to 10. Levemir 100 UNIT/ML Subcutaneous Solution;  Therapy: (Recorded:16Aug2010) to 11. Metoprolol Tartrate 25 MG Oral Tablet; Therapy: (Recorded:01Apr2014) to 12. NovoLOG SOLN; Therapy: (Recorded:20Feb2013) to 13. Oxybutynin Chloride 5 MG Oral Tablet; Therapy: (Recorded:01Apr2014) to 14. PredniSONE TABS; Therapy: (Recorded:01Apr2014) to 15. Prochlorperazine Maleate 10 MG Oral Tablet; Therapy: 11Jan2014 to 16. TraMADol HCl 50 MG Oral Tablet; Therapy: 29Jan2014 to  Allergies Medication  1. Codeine Derivatives 2. Morphine Derivatives  Family History Problems  1. Maternal history of  Breast Cancer V16.3 2. Daughter's history of  Death In The Family Child Daughter Lung Cancer 3. Paternal history of  Death In The Family Father Lung CancerAge 72 4. Maternal history of  Death In The Family Mother Breast CancerAge 63 5. Family history of  Family Health Status Number Of Children 1 son3 daughters-one deceased 6. Daughter's history of  Family Health Status Number Of Children 7. Daughter's history of  Lung Cancer V16.1 8. Paternal history of  Lung Cancer V16.1  Social History Problems  1. Daily Coffee Consumption (___ Cups/Day) 1 cup per day 2. Former Smoker Smoked for 20 yrs and has been smoke free since 1984 3. Marital History - Widowed 4. Retired From Work Denied  5. History of  Alcohol Use  Review of Systems  Gastrointestinal: no flank pain.  Constitutional: no fever.    Vitals Vital Signs [Data Includes: Last 1 Day]  23Apr2014 03:24PM  Blood Pressure: 108 / 67 Temperature: 97 F Heart Rate: 80  Results/Data  The following images/tracing/specimen were independently visualized:  I have reviewed her CT films and the report is pending. See comments above.    Assessment Assessed  1. Renal Abscess Right 590.2  I believe she is draining urine from the perc tube.   Plan Renal Abscess (590.2)  1. CREATININE, FLUID  Done: 23Apr2014 2. CT-ABD W/O CONTRAST  Done: 23Apr2014 12:00AM 3. Follow-up NP/PA Office  Follow-up   Requested for: 23Apr2014   I will send the fluid for creatinine and discuss the situation with Dr. Fredia Sorrow. If the Cr is elevated indicating urine leakage, we will need to consider changing the perc drain to a nephrostomy tube vs a stent and foley until the leak stops.  I will contact her with the results of the test. I will put her down for a 2-3 week f/u pending the results.

## 2012-07-09 NOTE — Progress Notes (Signed)
NPO AFTER MN. ARRIVES AT 0730. NEEDS ISTAT. CURRENT EKG AND CXR IN EPIC AND CHART. WILL TAKE METOPROLOL AND PREDNISONE AM OF SURG W/ SIPS OF WATER.

## 2012-07-10 ENCOUNTER — Encounter (HOSPITAL_BASED_OUTPATIENT_CLINIC_OR_DEPARTMENT_OTHER)
Admission: RE | Disposition: A | Discharge status: Home / Self Care | Payer: Self-pay | Source: Ambulatory Visit | Attending: Urology

## 2012-07-10 ENCOUNTER — Encounter (HOSPITAL_BASED_OUTPATIENT_CLINIC_OR_DEPARTMENT_OTHER): Payer: Self-pay | Admitting: Anesthesiology

## 2012-07-10 ENCOUNTER — Other Ambulatory Visit: Payer: Self-pay

## 2012-07-10 ENCOUNTER — Ambulatory Visit (HOSPITAL_BASED_OUTPATIENT_CLINIC_OR_DEPARTMENT_OTHER)
Admission: RE | Admit: 2012-07-10 | Discharge: 2012-07-10 | Disposition: A | Discharge status: Home / Self Care | Payer: Medicare PPO | Source: Ambulatory Visit | Attending: Urology | Admitting: Urology

## 2012-07-10 ENCOUNTER — Ambulatory Visit (HOSPITAL_BASED_OUTPATIENT_CLINIC_OR_DEPARTMENT_OTHER): Payer: Medicare PPO | Admitting: Anesthesiology

## 2012-07-10 DIAGNOSIS — J449 Chronic obstructive pulmonary disease, unspecified: Secondary | ICD-10-CM | POA: Insufficient documentation

## 2012-07-10 DIAGNOSIS — I509 Heart failure, unspecified: Secondary | ICD-10-CM | POA: Insufficient documentation

## 2012-07-10 DIAGNOSIS — Z79899 Other long term (current) drug therapy: Secondary | ICD-10-CM | POA: Insufficient documentation

## 2012-07-10 DIAGNOSIS — E1142 Type 2 diabetes mellitus with diabetic polyneuropathy: Secondary | ICD-10-CM | POA: Insufficient documentation

## 2012-07-10 DIAGNOSIS — K219 Gastro-esophageal reflux disease without esophagitis: Secondary | ICD-10-CM | POA: Insufficient documentation

## 2012-07-10 DIAGNOSIS — J4489 Other specified chronic obstructive pulmonary disease: Secondary | ICD-10-CM | POA: Insufficient documentation

## 2012-07-10 DIAGNOSIS — I10 Essential (primary) hypertension: Secondary | ICD-10-CM | POA: Insufficient documentation

## 2012-07-10 DIAGNOSIS — E1149 Type 2 diabetes mellitus with other diabetic neurological complication: Secondary | ICD-10-CM | POA: Insufficient documentation

## 2012-07-10 DIAGNOSIS — N3945 Continuous leakage: Secondary | ICD-10-CM | POA: Insufficient documentation

## 2012-07-10 DIAGNOSIS — D649 Anemia, unspecified: Secondary | ICD-10-CM | POA: Insufficient documentation

## 2012-07-10 HISTORY — DX: Other giant cell arteritis: M31.6

## 2012-07-10 HISTORY — DX: Type 2 diabetes mellitus with diabetic polyneuropathy: E11.42

## 2012-07-10 HISTORY — PX: CYSTOSCOPY W/ URETERAL STENT PLACEMENT: SHX1429

## 2012-07-10 HISTORY — DX: Benign neoplasm of unspecified kidney: D30.00

## 2012-07-10 HISTORY — DX: Other chronic cystitis without hematuria: N30.20

## 2012-07-10 HISTORY — DX: Type 2 diabetes mellitus without complications: E11.9

## 2012-07-10 HISTORY — DX: Personal history of transient ischemic attack (TIA), and cerebral infarction without residual deficits: Z86.73

## 2012-07-10 HISTORY — DX: Reserved for concepts with insufficient information to code with codable children: IMO0002

## 2012-07-10 HISTORY — DX: Long term (current) use of insulin: Z79.4

## 2012-07-10 HISTORY — DX: Unspecified macular degeneration: H35.30

## 2012-07-10 HISTORY — DX: Reserved for inherently not codable concepts without codable children: IMO0001

## 2012-07-10 LAB — POCT I-STAT, CHEM 8
Creatinine, Ser: 1.2 mg/dL — ABNORMAL HIGH (ref 0.50–1.10)
HCT: 35 % — ABNORMAL LOW (ref 36.0–46.0)
Hemoglobin: 11.9 g/dL — ABNORMAL LOW (ref 12.0–15.0)
Potassium: 3.9 mEq/L (ref 3.5–5.1)
Sodium: 142 mEq/L (ref 135–145)

## 2012-07-10 SURGERY — CYSTOSCOPY, WITH RETROGRADE PYELOGRAM AND URETERAL STENT INSERTION
Anesthesia: General | Site: Ureter | Laterality: Right | Wound class: Clean Contaminated

## 2012-07-10 MED ORDER — IOHEXOL 350 MG/ML SOLN
INTRAVENOUS | Status: DC | PRN
  Administered 2012-07-10: 10 mL

## 2012-07-10 MED ORDER — SODIUM CHLORIDE 0.9 % IJ SOLN
3.0000 mL | INTRAMUSCULAR | Status: DC | PRN
  Filled 2012-07-10: qty 3

## 2012-07-10 MED ORDER — SODIUM CHLORIDE 0.9 % IR SOLN
Status: DC | PRN
  Administered 2012-07-10: 3000 mL via INTRAVESICAL

## 2012-07-10 MED ORDER — PROPOFOL 10 MG/ML IV BOLUS
INTRAVENOUS | Status: DC | PRN
  Administered 2012-07-10: 100 mg via INTRAVENOUS

## 2012-07-10 MED ORDER — FENTANYL CITRATE 0.05 MG/ML IJ SOLN
25.0000 ug | INTRAMUSCULAR | Status: DC | PRN
  Filled 2012-07-10: qty 1

## 2012-07-10 MED ORDER — SODIUM CHLORIDE 0.9 % IJ SOLN
3.0000 mL | Freq: Two times a day (BID) | INTRAMUSCULAR | Status: DC
  Filled 2012-07-10: qty 3

## 2012-07-10 MED ORDER — ONDANSETRON HCL 4 MG/2ML IJ SOLN
4.0000 mg | Freq: Four times a day (QID) | INTRAMUSCULAR | Status: DC | PRN
  Filled 2012-07-10: qty 2

## 2012-07-10 MED ORDER — LACTATED RINGERS IV SOLN
INTRAVENOUS | Status: DC
  Filled 2012-07-10: qty 1000

## 2012-07-10 MED ORDER — EPHEDRINE SULFATE 50 MG/ML IJ SOLN
INTRAMUSCULAR | Status: DC | PRN
  Administered 2012-07-10: 10 mg via INTRAVENOUS

## 2012-07-10 MED ORDER — SODIUM CHLORIDE 0.9 % IV SOLN
250.0000 mL | INTRAVENOUS | Status: DC | PRN
  Filled 2012-07-10: qty 250

## 2012-07-10 MED ORDER — ONDANSETRON HCL 4 MG/2ML IJ SOLN
INTRAMUSCULAR | Status: DC | PRN
  Administered 2012-07-10: 4 mg via INTRAVENOUS

## 2012-07-10 MED ORDER — CEPHALEXIN 250 MG PO CAPS: 250.0000 mg | ORAL_CAPSULE | Freq: Four times a day (QID) | ORAL | Status: DC

## 2012-07-10 MED ORDER — LACTATED RINGERS IV SOLN
INTRAVENOUS | Status: DC
  Administered 2012-07-10: 08:00:00 via INTRAVENOUS
  Filled 2012-07-10: qty 1000

## 2012-07-10 MED ORDER — ACETAMINOPHEN 325 MG PO TABS
650.0000 mg | ORAL_TABLET | ORAL | Status: DC | PRN
  Filled 2012-07-10: qty 2

## 2012-07-10 MED ORDER — ACETAMINOPHEN 650 MG RE SUPP
650.0000 mg | RECTAL | Status: DC | PRN
  Filled 2012-07-10: qty 1

## 2012-07-10 MED ORDER — LIDOCAINE HCL (CARDIAC) 20 MG/ML IV SOLN
INTRAVENOUS | Status: DC | PRN
  Administered 2012-07-10: 60 mg via INTRAVENOUS

## 2012-07-10 MED ORDER — CEFAZOLIN SODIUM-DEXTROSE 2-3 GM-% IV SOLR
2.0000 g | INTRAVENOUS | Status: AC
  Administered 2012-07-10: 2 g via INTRAVENOUS
  Filled 2012-07-10: qty 50

## 2012-07-10 MED ORDER — PHENAZOPYRIDINE HCL 100 MG PO TABS: 100.0000 mg | ORAL_TABLET | Freq: Three times a day (TID) | ORAL | Status: DC | PRN

## 2012-07-10 MED ORDER — DEXAMETHASONE SODIUM PHOSPHATE 4 MG/ML IJ SOLN
INTRAMUSCULAR | Status: DC | PRN
  Administered 2012-07-10: 4 mg via INTRAVENOUS

## 2012-07-10 MED ORDER — INSULIN ASPART 100 UNIT/ML ~~LOC~~ SOLN
0.0000 [IU] | SUBCUTANEOUS | Status: DC
  Filled 2012-07-10: qty 0.15

## 2012-07-10 MED ORDER — FENTANYL CITRATE 0.05 MG/ML IJ SOLN
INTRAMUSCULAR | Status: DC | PRN
  Administered 2012-07-10: 50 ug via INTRAVENOUS

## 2012-07-10 SURGICAL SUPPLY — 20 items
BAG DRAIN URO-CYSTO SKYTR STRL (DRAIN) ×2 IMPLANT
CANISTER SUCT LVC 12 LTR MEDI- (MISCELLANEOUS) ×2 IMPLANT
CATH FOLEY 2WAY SLVR  5CC 16FR (CATHETERS) ×1
CATH FOLEY 2WAY SLVR 5CC 16FR (CATHETERS) ×1 IMPLANT
CATH URET 5FR 28IN CONE TIP (BALLOONS) ×1
CATH URET 5FR 28IN OPEN ENDED (CATHETERS) ×2 IMPLANT
CATH URET 5FR 70CM CONE TIP (BALLOONS) ×1 IMPLANT
CLOTH BEACON ORANGE TIMEOUT ST (SAFETY) ×2 IMPLANT
DRAPE CAMERA CLOSED 9X96 (DRAPES) ×2 IMPLANT
GLOVE BIO SURGEON STRL SZ 6.5 (GLOVE) ×2 IMPLANT
GLOVE BIO SURGEON STRL SZ7 (GLOVE) ×2 IMPLANT
GLOVE SURG SS PI 8.0 STRL IVOR (GLOVE) ×2 IMPLANT
GOWN PREVENTION PLUS LG XLONG (DISPOSABLE) ×2 IMPLANT
GOWN STRL REIN XL XLG (GOWN DISPOSABLE) ×2 IMPLANT
GUIDEWIRE 0.038 PTFE COATED (WIRE) IMPLANT
GUIDEWIRE ANG ZIPWIRE 038X150 (WIRE) IMPLANT
GUIDEWIRE STR DUAL SENSOR (WIRE) ×4 IMPLANT
NS IRRIG 500ML POUR BTL (IV SOLUTION) IMPLANT
PACK CYSTOSCOPY (CUSTOM PROCEDURE TRAY) ×2 IMPLANT
STENT CONTOUR 7FRX24 (STENTS) ×2 IMPLANT

## 2012-07-10 NOTE — Anesthesia Postprocedure Evaluation (Signed)
  Anesthesia Post-op Note  Patient: [name]  Procedure(s) Performed: Procedure(s) (LRB): CYSTOSCOPY WITH RIGHT RETROGRADE PYELOGRAM/URETERAL STENT PLACEMENT (Right)  Patient Location: PACU  Anesthesia Type: General  Level of Consciousness: awake and alert   Airway and Oxygen Therapy: Patient Spontanous Breathing  Post-op Pain: mild  Post-op Assessment: Post-op Vital signs reviewed, Patient's Cardiovascular Status Stable, Respiratory Function Stable, Patent Airway and No signs of Nausea or vomiting  Last Vitals:  Filed Vitals:   07/10/12 1130  BP: 105/68  Pulse: 68  Temp: 36 C  Resp: 16    Post-op Vital Signs: stable   Complications: No apparent anesthesia complications

## 2012-07-10 NOTE — Anesthesia Preprocedure Evaluation (Addendum)
Anesthesia Evaluation  Patient identified by MRN, date of birth, ID band Patient awake    Reviewed: Allergy & Precautions, H&P , NPO status , Patient's Chart, lab work & pertinent test results  History of Anesthesia Complications (+) PONV  Airway Mallampati: II TM Distance: >3 FB Neck ROM: full    Dental  (+) Edentulous Upper, Edentulous Lower and Dental Advisory Given   Pulmonary COPD Mild COPD breath sounds clear to auscultation  Pulmonary exam normal       Cardiovascular Exercise Tolerance: Good hypertension, Pt. on medications +CHF Rhythm:regular Rate:Normal  Temporal arteritis.  History of volume overload CHF   Neuro/Psych Anterior cervical fusion.  TIA 2012.   Peripheral neuropathy TIAnegative psych ROS   GI/Hepatic negative GI ROS, Neg liver ROS,   Endo/Other  diabetes, Well Controlled, Type 2, Insulin Dependent  Renal/GU negative Renal ROS  negative genitourinary   Musculoskeletal   Abdominal   Peds  Hematology  (+) Blood dyscrasia, anemia , Auto immune thrombocytopenia and neutropenia   Anesthesia Other Findings   Reproductive/Obstetrics negative OB ROS                          Anesthesia Physical Anesthesia Plan  ASA: III  Anesthesia Plan: General   Post-op Pain Management:    Induction: Intravenous  Airway Management Planned: LMA  Additional Equipment:   Intra-op Plan:   Post-operative Plan:   Informed Consent: I have reviewed the patients History and Physical, chart, labs and discussed the procedure including the risks, benefits and alternatives for the proposed anesthesia with the patient or authorized representative who has indicated his/her understanding and acceptance.   Dental Advisory Given  Plan Discussed with: CRNA and Surgeon  Anesthesia Plan Comments:         Anesthesia Quick Evaluation

## 2012-07-10 NOTE — Anesthesia Postprocedure Evaluation (Signed)
  Anesthesia Post-op Note  Patient: [name]  Procedure(s) Performed: Procedure(s) (LRB): CYSTOSCOPY WITH RIGHT RETROGRADE PYELOGRAM/URETERAL STENT PLACEMENT (Right)  Patient Location: PACU  Anesthesia Type: General  Level of Consciousness: awake and alert   Airway and Oxygen Therapy: Patient Spontanous Breathing  Post-op Pain: mild  Post-op Assessment: Post-op Vital signs reviewed, Patient's Cardiovascular Status Stable, Respiratory Function Stable, Patent Airway and No signs of Nausea or vomiting  Last Vitals:  Filed Vitals:   07/10/12 1030  BP: 113/63  Pulse: 72  Temp:   Resp: 10    Post-op Vital Signs: stable   Complications: No apparent anesthesia complications

## 2012-07-10 NOTE — Anesthesia Procedure Notes (Signed)
Procedure Name: LMA Insertion Date/Time: 07/10/2012 9:26 AM Performed by: Norva Pavlov Pre-anesthesia Checklist: Patient identified, Emergency Drugs available, Suction available and Patient being monitored Patient Re-evaluated:Patient Re-evaluated prior to inductionOxygen Delivery Method: Circle System Utilized Preoxygenation: Pre-oxygenation with 100% oxygen Intubation Type: IV induction Ventilation: Mask ventilation without difficulty LMA: LMA inserted LMA Size: 4.0 Number of attempts: 1 Airway Equipment and Method: bite block Placement Confirmation: positive ETCO2 Tube secured with: Tape Dental Injury: Teeth and Oropharynx as per pre-operative assessment

## 2012-07-10 NOTE — Transfer of Care (Signed)
Immediate Anesthesia Transfer of Care Note  Patient: [name]  Procedure(s) Performed: Procedure(s) (LRB): CYSTOSCOPY WITH RIGHT RETROGRADE PYELOGRAM/URETERAL STENT PLACEMENT (Right)  Patient Location: PACU  Anesthesia Type: General  Level of Consciousness: awake, alert  and oriented  Airway & Oxygen Therapy: Patient Spontanous Breathing and Patient connected to face mask oxygen  Post-op Assessment: Report given to PACU RN and Post -op Vital signs reviewed and stable  Post vital signs: Reviewed and stable  Complications: No apparent anesthesia complications

## 2012-07-10 NOTE — Interval H&P Note (Signed)
History and Physical Interval Note:  07/10/2012 9:07 AM  [name] [name]  has presented today for surgery, with the diagnosis of right renal urine leak  The various methods of treatment have been discussed with the patient and family. After consideration of risks, benefits and other options for treatment, the patient has consented to  Procedure(s): CYSTOSCOPY WITH RIGHT RETROGRADE PYELOGRAM/URETERAL STENT PLACEMENT (Right) as a surgical intervention .  The patient's history has been reviewed, patient examined, no change in status, stable for surgery.  I have reviewed the patient's chart and labs.  Questions were answered to the patient's satisfaction.     Zahki Hoogendoorn J

## 2012-07-10 NOTE — Brief Op Note (Signed)
07/10/2012  9:47 AM  PATIENT:  [name]  79 y.o. female  PRE-OPERATIVE DIAGNOSIS:  right renal urine leak  POST-OPERATIVE DIAGNOSIS:  same  PROCEDURE:  Procedure(s): CYSTOSCOPY WITH RIGHT RETROGRADE PYELOGRAM/URETERAL STENT PLACEMENT (Right)  SURGEON:  Surgeon(s) and Role:    * Anner Crete, MD - Primary  PHYSICIAN ASSISTANT:   ASSISTANTS: none   ANESTHESIA:   general  EBL:  Total I/O In: 200 [I.V.:200] Out: -   BLOOD ADMINISTERED:none  DRAINS: Urinary Catheter (Foley) and 7x24 right JJ stent   LOCAL MEDICATIONS USED:  NONE  SPECIMEN:  No Specimen  DISPOSITION OF SPECIMEN:  N/A  COUNTS:  YES  TOURNIQUET:  * No tourniquets in log *  DICTATION: .Other Dictation: Dictation Number [id number]  PLAN OF CARE: Discharge to home after PACU  PATIENT DISPOSITION:  PACU - hemodynamically stable.   Delay start of Pharmacological VTE agent (>24hrs) due to surgical blood loss or risk of bleeding: not applicable

## 2012-07-10 NOTE — H&P (View-Only) (Signed)
   Subjective: I was asked to see [name] in consultation by Dr. Hongalgi for a right renal abscess.   She was seen in my office at the end of March and had an e. Coli on culture from 3/27.   She was given IM rocephin and had been placed on Keflex.   She was admitted on 4/2 and was found to have a 9cm right renal abscess on a CT on 4/4  in the area of her post ablation hematoma where she had an oncocytoma ablated by IR.   She was started on Rocephin.  A CT guided drainage tube was placed on 4/6.   There were GNR in the aspirate but the culture is pending.  Her culture from our office showed sensitivities to cephalosporins, aminoglycocides, zosyn and imipenem and resistance to tetracyclines, ampicillin and quinolones.   She is afebrile on treatment and is feeling better.    ROS: Negative except as above.  She has no pain or fever.   Past Medical History  Diagnosis Date  . Diabetes mellitus   . HTN (hypertension)   . Diabetes mellitus   . Cataract   . Osteoporosis   . 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 Carpal tunnel syndrome of right wrist 02/09/2011  . TIA (transient ischemic attack) 02/09/2011  . Right renal mass   . COPD (chronic obstructive pulmonary disease)     no inhalers used   . Pneumonia dec 2012  . CHF (congestive heart failure)   . Congestive heart failure   . Arthritis     ra  . Degenerative arthritis of cervical spine 02/09/2011  . Degenerative arthritis of hip 02/09/2011  . Degenerative arthritis of knee 02/09/2011  . PONV (postoperative nausea and vomiting)     in past, none recently  . Macular degeneration     both eyes   Past Surgical History  Procedure Laterality Date  . Back surgery  6 yrs ago    lower  . Eye surgery  yrs ago    both eyes cataract surgery  . Joint replacement  10 yrs ago    left hip, both knees replaced one 3 yrs ago, one 6 yrs ago  . Upper neck surgery  8 yrs ago  . Right carpal tunnel release  5  yrs go  . Right arm plate  5 yrs ago  . Both elbows surgery  10-12 yrs ago   History   Social History  . Marital Status: Widowed    Spouse Name: N/A    Number of Children: N/A  . Years of Education: N/A   Occupational History  . Not on file.   Social History Main Topics  . Smoking status: Former Smoker -- 2.00 packs/day for 30 years    Types: Cigarettes    Quit date: 01/11/1983  . Smokeless tobacco: Never Used  . Alcohol Use: No  . Drug Use: No  . Sexually Active: No   Other Topics Concern  . Not on file   Social History Narrative  . No narrative on file   Family History  Problem Relation Age of Onset  . Leukemia    . Cancer Mother   . Cancer Father   . Cancer Brother   . Cancer Daughter      Objective: Vital signs in last 24 hours: Temp:  [97.3 F (36.3 C)-98.3 F (36.8 C)] 97.3 F (36.3 C) (04/07 0648) Pulse Rate:  [68-99] 87 (04/07 0648)   Resp:  [9-18] 16 (04/07 0648) BP: (100-120)/(7-74) 107/63 mmHg (04/07 0648) SpO2:  [94 %-100 %] 94 % (04/07 0648) Weight:  [66.6 kg (146 lb 13.2 oz)] 66.6 kg (146 lb 13.2 oz) (04/07 0648)  Intake/Output from previous day: 04/06 0701 - 04/07 0700 In: 1170 [P.O.:1120; IV Piggyback:50] Out: 1180 [Urine:1100; Drains:30] Intake/Output this shift:    General appearance: alert and no distress Resp: clear to auscultation bilaterally Cardio: regular rate and rhythm GI: soft, non-tender; bowel sounds normal; no masses,  no organomegaly  Lab Results:   Recent Labs  06/09/12 0522 06/10/12 0514  WBC 9.4 10.6*  HGB 10.7* 11.6*  HCT 31.8* 34.5*  PLT 122* 125*   BMET  Recent Labs  06/10/12 0514 06/11/12 0457  NA 128* 132*  K 4.3 4.4  CL 93* 95*  CO2 29 28  GLUCOSE 194* 133*  BUN 23 18  CREATININE 0.79 0.70  CALCIUM 9.5 9.5   PT/INR  Recent Labs  06/10/12 0514  LABPROT 13.1  INR 1.00   ABG No results found for this basename: PHART, PCO2, PO2, HCO3,  in the last 72 hours  Studies/Results: Ct  Guided Abscess Drain  06/10/2012  *RADIOLOGY REPORT*  Clinical data:  Right perinephric abscess post renal cryoablation.  CT-GUIDED RETROPERITONEAL ABSCESS DRAINAGE CATHETER PLACEMENT  Technique and findings: The procedure, risks (including but not limited to bleeding, infection, organ damage), benefits, and alternatives were explained to the patient.  Questions regarding the procedure were encouraged and answered.  The patient understands and consents to the procedure.The patient was placed prone on the CT gantry and limited   axial scans through the mid abdomen obtained.  The collection was localized and an appropriate skin entry site determined. Operator donned sterile gloves and mask.   Site was marked, prepped with Betadine, draped in usual sterile fashion, infiltrated locally with 1% lidocaine.  Intravenous Fentanyl and Versed were administered as conscious sedation during continuous cardiorespiratory monitoring by the radiology RN, with a total moderate sedation time of less than 30 minutes.  Under CT fluoroscopic guidance, a 19 gauge percutaneous entry needle was advanced into the collection.  Purulent bloody material could be aspirated.  An Amplatz guide wire advanced easily within the collection.  Tract was dilated to allow placement 12-French pigtail catheter, formed within the dominant component of the collection.  Catheter position was confirmed on CT.  Catheter was secured externally with O-Prolene suture and Statlock, and placed to gravity drain bag.  20 ml of bloody purulent output were aspirated, a sample sent for routine Gram stain and culture. The patient tolerated the procedure well.  No immediate complication.  IMPRESSION: Technically successful CT-guided retroperitoneal abscess drainage catheter placement.   Original Report Authenticated By: D. Hassell III, MD     Anti-infectives: Anti-infectives   Start     Dose/Rate Route Frequency Ordered Stop   06/07/12 2200  cefTRIAXone (ROCEPHIN) 1  g in dextrose 5 % 50 mL IVPB     1 g 100 mL/hr over 30 Minutes Intravenous Every 24 hours 06/07/12 0418     06/06/12 2315  cefTRIAXone (ROCEPHIN) 1 g in dextrose 5 % 50 mL IVPB     1 g 100 mL/hr over 30 Minutes Intravenous  Once 06/06/12 2300 06/07/12 0022      Current Facility-Administered Medications  Medication Dose Route Frequency Provider Last Rate Last Dose  . acetaminophen (TYLENOL) tablet 650 mg  650 mg Oral Q6H PRN Anand D Hongalgi, MD   650 mg at 06/07/12   1342  . amitriptyline (ELAVIL) tablet 50 mg  50 mg Oral QHS Jared M. Gardner, DO   50 mg at 06/10/12 2218  . aspirin EC tablet 325 mg  325 mg Oral Daily Jared M. Gardner, DO   325 mg at 06/11/12 0744  . cefTRIAXone (ROCEPHIN) 1 g in dextrose 5 % 50 mL IVPB  1 g Intravenous Q24H Jared M. Gardner, DO   1 g at 06/10/12 2217  . feeding supplement (GLUCERNA SHAKE) liquid 237 mL  237 mL Oral BID BM Reanne J Barnett, RD   237 mL at 06/10/12 1414  . gabapentin (NEURONTIN) capsule 300 mg  300 mg Oral TID Jared M. Gardner, DO   300 mg at 06/10/12 2218  . heparin injection 5,000 Units  5,000 Units Subcutaneous Q8H D Kevin Allred, PA-C   5,000 Units at 06/11/12 0705  . HYDROcodone-acetaminophen (NORCO/VICODIN) 5-325 MG per tablet 1-2 tablet  1-2 tablet Oral Q4H PRN Dayne Daniel Hassell III, MD   2 tablet at 06/10/12 2204  . insulin aspart (novoLOG) injection 0-15 Units  0-15 Units Subcutaneous TID WC Jared M. Gardner, DO   5 Units at 06/10/12 1738  . insulin detemir (LEVEMIR) injection 10 Units  10 Units Subcutaneous QHS Jared M. Gardner, DO   10 Units at 06/10/12 2217  . multivitamin with minerals tablet 1 tablet  1 tablet Oral Daily Reanne J Barnett, RD   1 tablet at 06/10/12 1029  . oxybutynin (DITROPAN) tablet 5 mg  5 mg Oral TID PRN Jared M. Gardner, DO      . predniSONE (DELTASONE) tablet 3 mg  3 mg Oral q morning - 10a Jared M. Gardner, DO   3 mg at 06/10/12 1029  . sodium chloride 0.9 % injection 3 mL  3 mL Intravenous Q12H Jared M.  Gardner, DO   3 mL at 06/10/12 2217  . traMADol (ULTRAM) tablet 50 mg  50 mg Oral Q8H PRN Jared M. Gardner, DO   50 mg at 06/09/12 2358    Assessment: Right renal abscess doing well on rocephin and post IR drainage. Wound culture pending.  Plan: Could probably switch back to po Keflex based on her prior culture. Rescan in 1-2 weeks to determine timing of tube removal.  CC: Dr. Hongalgi, Dr. Yamagata and Dr. Hatcher.     LOS: 5 days    Raynah Gomes J 06/11/2012  

## 2012-07-11 ENCOUNTER — Encounter (HOSPITAL_BASED_OUTPATIENT_CLINIC_OR_DEPARTMENT_OTHER): Payer: Self-pay | Admitting: Urology

## 2012-07-11 NOTE — Op Note (Signed)
NAMESELDA, JALBERT               ACCOUNT NO.:  [id number]  MEDICAL RECORD NO.:  [id number]  LOCATION:                                 FACILITY:  PHYSICIAN:  Excell Seltzer. Annabell Howells, M.D.    DATE OF BIRTH:  09/14/1924  DATE OF PROCEDURE:  07/10/2012 DATE OF DISCHARGE:                              OPERATIVE REPORT   PROCEDURES:  Cystoscopy, right retrograde pyelogram with interpretation, placement of right double-J stent.  PREOPERATIVE DIAGNOSIS:  Right renal urine leak.  POSTOPERATIVE DIAGNOSIS:  Right renal urine leak.  SURGEON:  Excell Seltzer. Annabell Howells, MD  ANESTHESIA:  General.  SPECIMEN:  None.  DRAINS:  A 7 x 24 right double-J stent and 16-French Foley catheter.  COMPLICATIONS:  None.  INDICATIONS:  Ms. Royle is an 79 year old white female, who had a right renal tumor treated with cryoablation.  She did well initially but then developed a right renal abscess, it was managed with percutaneous drainage.  The drain continued to drain following placement and the fluid became more clear consistent with urine.  A creatinine level was checked and it was consistent with urine.  It was felt that placement of a right ureteral stent and Foley catheter were indicated to try to decompress the kidney to allow healing of the fistulous tract.  FINDINGS OF PROCEDURE:  She had been on Keflex and was given Ancef preoperatively.  She was taken to the operating room where general anesthetic was induced.  She was placed in lithotomy position and fitted with PAS hose.  The perineum and genitalia were prepped with Betadine solution.  She was draped in usual sterile fashion.  Cystoscopy was performed using a 22-French scope and 12-degree lens. Examination revealed a normal urethra.  The bladder wall was smooth and pale without tumor, stones, or inflammation.  Ureteral orifices were unremarkable.  The right ureteral orifice was cannulated with a 5-French open-end catheter and contrast was instilled.  A  right retrograde pyelogram revealed a normal ureter and intrarenal collecting system.  However, there appeared to be a contrast leak as best I could tell was coming from the upper pole caliceal system filling into the percutaneous tube tract.  Once retrograde pyelogram had been completed, a guidewire was passed to the upper pole, and a 7-French 24-cm double-J stent was advanced over the wire under fluoroscopic guidance.  The wire was removed leaving a good coil in the upper pole of the kidney and coil in the bladder.  The bladder was drained and a 16-French Foley catheter was inserted.  The balloon was filled with 10 mL of sterile fluid.  The catheter was placed to leg bag drainage.  The patient was taken out of lithotomy position.  Her anesthetic was reversed.  She was moved to recovery room in stable condition.  There were no complications.     Excell Seltzer. Annabell Howells, M.D.     JJW/MEDQ  D:  07/10/2012  T:  07/10/2012  Job:  [id number]  cc:   Sherrine Maples T. Fredia Sorrow, M.D. Fax: [phone]

## 2012-07-12 ENCOUNTER — Ambulatory Visit: Payer: Medicare Other | Admitting: Internal Medicine

## 2012-07-15 NOTE — Progress Notes (Signed)
Subjective:    Patient ID: [name], female    DOB: [birth year], 79 y.o.   MRN: [id number]  HPI Patient is here in followup of hospitalization June 06, 2012 through June 13, 2012.She had a very rough hospitalization and feels  lucky that she survived. She is feeling better at this time. She remained quite weak.  Hospital Admission Diagnosis  Dehydration [276.51] UTI (urinary tract infection) [599.0] Recurrent UTI [599.0] AKI (acute kidney injury) [584.9] Diabetes mellitus [250.00] Hypotension [458.9]  Hospital Discharge Diagnosis     Principal Problem:   Renal abscess, right Active Problems:   Diabetes mellitus   Renal oncocytoma   Anemia   Retroperitoneal bleed   Recurrent UTI   Hypotension   AKI (acute kidney injury)   SIRS (systemic inflammatory response syndrome)   Hyponatremia  Discharge instructions from the hospital requested that we pay close attention to her renal function and electrolytes.   Patient was taken off multiple drugs while in the hospital: Enalapril, furosemide, hydrocodone/APAP, MVI, oxybutynin. Current Outpatient Prescriptions on File Prior to Visit  Medication Sig Dispense Refill  . amitriptyline (ELAVIL) 50 MG tablet Take 50 mg by mouth at bedtime.      Marland Kitchen aspirin EC 325 MG tablet Take 325 mg by mouth daily.       Marland Kitchen gabapentin (NEURONTIN) 300 MG capsule Take 300 mg by mouth 3 (three) times daily.      . insulin aspart (NOVOLOG) 100 UNIT/ML injection Inject 0-15 Units into the skin as needed. Sugar level less than 150 do not take any Novolog, if sugar level is between 151-200 take 3 units, 201-250 take 5 units, 251-300 take 7 units 301-350 take 9 units, 351-400 take 11 units, sugar level over 400 take 15 units and notify docotor      . metoprolol tartrate (LOPRESSOR) 12.5 mg TABS Take 12.5 mg by mouth 2 (two) times daily.      . Multiple Vitamin (MULTIVITAMIN WITH MINERALS) TABS Take 1 tablet by mouth daily.  30 tablet  0  . predniSONE (DELTASONE)  1 MG tablet Take 3 mg by mouth every morning.      . traMADol (ULTRAM) 50 MG tablet Take 50 mg by mouth every 8 (eight) hours as needed. Pain       No current facility-administered medications on file prior to visit.      Review of Systems  Constitutional: Positive for activity change, appetite change and fatigue. Negative for fever, chills and diaphoresis.  HENT: Positive for hearing loss, neck pain, neck stiffness and tinnitus. Negative for ear pain, nosebleeds, congestion, facial swelling, rhinorrhea, sneezing, postnasal drip, sinus pressure and ear discharge.   Eyes: Negative.   Respiratory: Positive for shortness of breath. Negative for cough, choking and wheezing.   Cardiovascular: Positive for leg swelling. Negative for chest pain and palpitations.  Gastrointestinal: Positive for constipation. Negative for nausea, vomiting, abdominal pain, diarrhea, blood in stool, abdominal distention, anal bleeding and rectal pain.  Endocrine: Negative for cold intolerance, heat intolerance, polydipsia, polyphagia and polyuria.  Genitourinary: Positive for urgency, frequency and flank pain. Negative for hematuria, genital sores and pelvic pain.  Musculoskeletal: Positive for myalgias, back pain, arthralgias and gait problem. Negative for joint swelling.  Skin: Positive for pallor.  Allergic/Immunologic: Negative.   Neurological: Positive for weakness and headaches. Negative for dizziness, tremors, seizures, syncope, speech difficulty, light-headedness and numbness.  Hematological: Negative.   Psychiatric/Behavioral: Positive for sleep disturbance. Negative for suicidal ideas, hallucinations, behavioral problems, confusion, self-injury and dysphoric  mood. The patient is nervous/anxious. The patient is not hyperactive.        Short-term memory loss.       Objective:BP 126/70  Pulse 64  Temp(Src) 97.7 F (36.5 C)  Resp 20  Ht 5' (1.524 m)  Wt 152 lb (68.947 kg)  BMI 29.69 kg/m2    Physical  Exam  Constitutional: She is oriented to person, place, and time. She appears well-developed and well-nourished. No distress.  Frail elderly female  HENT:  Head: Normocephalic and atraumatic.  Right Ear: External ear normal.  Left Ear: External ear normal.  Nose: Nose normal.  Mouth/Throat: No oropharyngeal exudate.  Partial loss of hearing bilaterally  Eyes: Conjunctivae and EOM are normal. Pupils are equal, round, and reactive to light.  Neck: Normal range of motion. Neck supple. No JVD present. No tracheal deviation present. No thyromegaly present.  Cardiovascular: Normal rate, regular rhythm, normal heart sounds and intact distal pulses.  Exam reveals no gallop and no friction rub.   No murmur heard. Pulmonary/Chest: Effort normal and breath sounds normal. No respiratory distress. She has no wheezes. She has no rales. She exhibits no tenderness.  Abdominal: Soft. Bowel sounds are normal. She exhibits no distension and no mass. There is no tenderness.  Musculoskeletal: Normal range of motion. She exhibits no edema and no tenderness.  Unstable gait  Lymphadenopathy:    She has no cervical adenopathy.  Neurological: She is alert and oriented to person, place, and time. No cranial nerve deficit.  Skin: Skin is warm and dry. No rash noted. No erythema. No pallor.  Psychiatric: She has a normal mood and affect. Her behavior is normal. Judgment and thought content normal.    Office Visit on 06/19/2012  Component Date Value Range Status  . Glucose 06/19/2012 128* 65 - 99 mg/dL Final  . BUN 16/12/9602 30* 8 - 27 mg/dL Final  . Creatinine, Ser 06/19/2012 1.24* 0.57 - 1.00 mg/dL Final  . GFR calc non Af Amer 06/19/2012 40* >59 mL/min/1.73 Final  . GFR calc Af Amer 06/19/2012 46* >59 mL/min/1.73 Final  . BUN/Creatinine Ratio 06/19/2012 24  11 - 26 Final  . Sodium 06/19/2012 137  134 - 144 mmol/L Final  . Potassium 06/19/2012 6.0* 3.5 - 5.2 mmol/L Final  . Chloride 06/19/2012 98  97 - 108  mmol/L Final  . CO2 06/19/2012 28  19 - 28 mmol/L Final  . Calcium 06/19/2012 10.0  8.6 - 10.2 mg/dL Final  Admission on 54/11/8117, Discharged on 06/13/2012  No results displayed because visit has over 200 results.    Office Visit on 05/30/2012  Component Date Value Range Status  . Color, UA 05/30/2012 yellow   Final  . Clarity, UA 05/30/2012 hazy   Final  . Glucose, UA 05/30/2012 neg   Final  . Bilirubin, UA 05/30/2012 small   Final  . Ketones, UA 05/30/2012 trace   Final  . Spec Grav, UA 05/30/2012 >=1.030   Final  . Blood, UA 05/30/2012 large   Final  . pH, UA 05/30/2012 5.0   Final  . Protein, UA 05/30/2012 100   Final  . Urobilinogen, UA 05/30/2012 0.2   Final  . Nitrite, UA 05/30/2012 neg   Final  . Leukocytes, UA 05/30/2012 moderate (2+)   Final  . WBC, Ur, HPF, POC 05/30/2012 tntc   Final  . RBC, urine, microscopic 05/30/2012 5-10   Final  . Bacteria, U Microscopic 05/30/2012 3+   Final  . Mucus, UA 05/30/2012 neg  Final  . Epithelial cells, urine per micros 05/30/2012 0-4   Final  . Crystals, Ur, HPF, POC 05/30/2012 neg   Final  . Casts, Ur, LPF, POC 05/30/2012 neg   Final  . Yeast, UA 05/30/2012 neg   Final  . Culture 05/30/2012 ESCHERICHIA COLI   Final  . Colony Count 05/30/2012 >=100,000 COLONIES/ML   Final  . Organism ID, Bacteria 05/30/2012 ESCHERICHIA COLI   Final  . WBC 05/30/2012 14.9* 4.6 - 10.2 K/uL Final  . Lymph, poc 05/30/2012 2.5  0.6 - 3.4 Final  . POC LYMPH PERCENT 05/30/2012 17.1  10 - 50 %L Final  . MID (cbc) 05/30/2012 0.8  0 - 0.9 Final  . POC MID % 05/30/2012 5.4  0 - 12 %M Final  . POC Granulocyte 05/30/2012 11.5* 2 - 6.9 Final  . Granulocyte percent 05/30/2012 77.5  37 - 80 %G Final  . RBC 05/30/2012 4.37  4.04 - 5.48 M/uL Final  . Hemoglobin 05/30/2012 12.8  12.2 - 16.2 g/dL Final  . HCT, POC 81/19/1478 41.4  37.7 - 47.9 % Final  . MCV 05/30/2012 94.8  80 - 97 fL Final  . MCH, POC 05/30/2012 29.3  27 - 31.2 pg Final  . MCHC 05/30/2012  30.9* 31.8 - 35.4 g/dL Final  . RDW, POC 29/56/2130 16.9   Final  . Platelet Count, POC 05/30/2012 285  142 - 424 K/uL Final  . MPV 05/30/2012 7.9  0 - 99.8 fL Final  . TSH 05/30/2012 0.979  0.350 - 4.500 uIU/mL Final  . Sodium 05/30/2012 135  135 - 145 mEq/L Final  . Potassium 05/30/2012 4.6  3.5 - 5.3 mEq/L Final  . Chloride 05/30/2012 95* 96 - 112 mEq/L Final  . CO2 05/30/2012 26  19 - 32 mEq/L Final  . Glucose, Bld 05/30/2012 194* 70 - 99 mg/dL Final  . BUN 86/57/8469 49* 6 - 23 mg/dL Final  . Creat 62/95/2841 1.89* 0.50 - 1.10 mg/dL Final  . Total Bilirubin 05/30/2012 0.4  0.3 - 1.2 mg/dL Final  . Alkaline Phosphatase 05/30/2012 121* 39 - 117 U/L Final  . AST 05/30/2012 20  0 - 37 U/L Final  . ALT 05/30/2012 17  0 - 35 U/L Final  . Total Protein 05/30/2012 7.0  6.0 - 8.3 g/dL Final  . Albumin 32/44/0102 3.9  3.5 - 5.2 g/dL Final  . Calcium 72/53/6644 9.5  8.4 - 10.5 mg/dL Final         Assessment & Plan:  1. Secondary diabetes mellitus with unspecified complication, uncontrolled Diabetic control was poor during the course of the hospital stay. She feels that she can do better now that she is back home. - Basic Metabolic Panel - CMP; Future - Basic Metabolic Panel; Future  2. Malignant neoplasm of renal pelvis, unspecified laterality Continues followup with Dr. Bjorn Pippin - CBC with Differential; Future  3. Dysphagia, unspecified Patient says she is not choking.  4. Urinary frequency Chronic condition. History Recurrent UTI. Recent UTI when hospitalized - Urinalysis with Reflex Microscopic; Future  5. Unspecified late effects of cerebrovascular disease Suspicious for cerebrovascular association with memory loss  6. Abnormality of gait Unstable on feet. She should use walker  7. Muscle weakness (generalized) Continue home physical therapy.  8. Anxiety state, unspecified Comment the office today.  9. Osteoarthrosis, unspecified whether generalized or localized,  unspecified site Multiple painful joints and muscles  10. Anemia, unspecified Additional lab followup necessary  11. Other and unspecified hyperlipidemia Followup  labs next visit  12. Cervicalgia Chronic neck pains have improved  13. Unspecified essential hypertension Controlled  14. Unspecified urinary incontinence Chronic. Remains a problem.  15. Osteoarthrosis, unspecified whether generalized or localized, lower leg Chronic. It looks most joints including neck, shoulders, Hands, hips, knees, and feet.  16. Lumbago Chronic low back discomfort. Finds it difficult to stand and do dishes or laundry.  17. Enthesopathy of hip region Right hip tenderness the greater trochanter. Suspicious for her trochanteric bursitis or perhaps a tendinitis in this area  18. Diabetes mellitus Recent poor control hospitalized with sepsis

## 2012-07-16 ENCOUNTER — Other Ambulatory Visit: Payer: Self-pay | Admitting: *Deleted

## 2012-07-16 MED ORDER — INSULIN DETEMIR 100 UNIT/ML ~~LOC~~ SOLN: 30.0000 [IU] | Freq: Every day | SUBCUTANEOUS | Status: DC

## 2012-07-16 MED ORDER — MELOXICAM 7.5 MG PO TABS: 7.5000 mg | ORAL_TABLET | Freq: Every day | ORAL | Status: DC

## 2012-07-16 MED ORDER — AMITRIPTYLINE HCL 50 MG PO TABS: 50.0000 mg | ORAL_TABLET | Freq: Every day | ORAL | Status: DC

## 2012-07-20 ENCOUNTER — Other Ambulatory Visit: Payer: Medicare Other

## 2012-07-23 ENCOUNTER — Encounter: Payer: Self-pay | Admitting: *Deleted

## 2012-07-24 ENCOUNTER — Ambulatory Visit (INDEPENDENT_AMBULATORY_CARE_PROVIDER_SITE_OTHER): Payer: Medicare HMO | Admitting: Nurse Practitioner

## 2012-07-24 ENCOUNTER — Encounter: Payer: Self-pay | Admitting: Nurse Practitioner

## 2012-07-24 VITALS — BP 118/72 | HR 90 | Temp 97.2°F | Resp 22 | Ht 61.0 in | Wt 150.0 lb

## 2012-07-24 DIAGNOSIS — E119 Type 2 diabetes mellitus without complications: Secondary | ICD-10-CM

## 2012-07-24 DIAGNOSIS — E785 Hyperlipidemia, unspecified: Secondary | ICD-10-CM

## 2012-07-24 DIAGNOSIS — M6281 Muscle weakness (generalized): Secondary | ICD-10-CM

## 2012-07-24 DIAGNOSIS — D649 Anemia, unspecified: Secondary | ICD-10-CM

## 2012-07-24 MED ORDER — TRAMADOL HCL 50 MG PO TABS: 50.0000 mg | ORAL_TABLET | Freq: Three times a day (TID) | ORAL | Status: DC | PRN

## 2012-07-24 NOTE — Progress Notes (Signed)
Patient ID: [name], female   DOB: [birth year], 79 y.o.   MRN: [id number]  Allergies  Allergen Reactions  . Allegra (Fexofenadine Hcl)   . Codeine Nausea And Vomiting  . Levaquin (Levofloxacin In D5w)   . Morphine And Related Nausea And Vomiting  . Tussionex Pennkinetic Er (Hydrocod Polst-Cpm Polst Er)     Chief Complaint  Patient presents with  . Medical Managment of Chronic Issues    patient has been seeing urologist    HPI: Patient is a 79 y.o. female seen in the office today for routine follow up as well as hospital follow up Was previously in the hosptial and underwent Cystoscopy, right retrograde pyelogram with interpretation,  placement of right double-J stent. Reports after she went home she was feeling the best she had in years. Has completed physical therapy and is following up with urology today after she leaves this appt.  Missed lab appt and hopes to get blood work drawn today  Diabetes- taking levemir 30 and novolog SSI- reports she takes her blood sugars occasionally if she thinks she has eaten something that runs her blood sugar up- then she will give novolog based on her sliding scale. takes blood sugar twice daily routinely. No hypoglycemic episodes recently    Review of Systems:  Review of Systems  Constitutional: Negative for fever, chills and malaise/fatigue.  Eyes: Negative for blurred vision.  Respiratory: Negative for cough and shortness of breath.   Cardiovascular: Negative for chest pain and palpitations.  Gastrointestinal: Negative for abdominal pain, diarrhea and constipation.  Genitourinary:       Has stents connected to leg bags for all urine- blood tinged and that is why she she is going back to urologist today  Musculoskeletal: Positive for joint pain (shoulder pain and hands).  Neurological: Negative for weakness.  Psychiatric/Behavioral: Positive for memory loss. Negative for depression. The patient is not nervous/anxious and does not have  insomnia.      Past Medical History  Diagnosis Date  . HTN (hypertension)   . Osteoporosis   . 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 COPD (chronic obstructive pulmonary disease)     no inhalers used   . PONV (postoperative nausea and vomiting)     in past, none recently  . History of TIA (transient ischemic attack) 2012--  NO RESIDUAL  . CHF (congestive heart failure)   . Arthritis     BACK , NECK, AND JOINTS  . Temporal arteritis   . Chronic steroid use   . 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 Macular degeneration of both eyes   . Diabetic peripheral neuropathy   . Insulin dependent diabetes mellitus   . Chronic cystitis   . Hemoperitoneum (nontraumatic)   . Malignant neoplasm of renal pelvis   . Urinary frequency   . Unspecified late effects of cerebrovascular disease   . Abnormality of gait   . Osteoporosis, unspecified   . Anxiety state, unspecified   . Herpes zoster with other nervous system complications   . Anemia, unspecified   . Type I (juvenile type) diabetes mellitus without mention of complication, uncontrolled   . Other and unspecified hyperlipidemia   . Personality change due to conditions classified elsewhere   . Unspecified essential hypertension   . Dermatophytosis of nail   . Depressive disorder, not elsewhere classified   . Diaphragmatic hernia without mention of obstruction or gangrene  Past Surgical History  Procedure Laterality Date  . Lumbar laminectomy  11-19-2003    L3 -- L4  . Total hip arthroplasty Left 01-22-2005  . Total knee arthroplasty Bilateral LEFT 08-08-2007;  RIGHT 05-08-2009  . Anterior cervical decomp/discectomy fusion  10-12-2005    C5 -- C7  . Posterior fusion cervical spine  02-10-2006    C5 -- C7  . Orif w/ plate right distal radius fx  06-18-2003  . Cataract extraction w/ intraocular lens  implant, bilateral    . Cryoablation right renal mass   03-16-2012    ONCOCYTOMA (POST OP 06-09-2012 PERCUTANEOUS DRAIN PLACEMENT FOR PERINEPHRIC ABSCESS)  . Temporal artery biopsy / ligation    . Elbow surgery Bilateral 2003  (APPROX)  . Carpal tunnel release Right 2009  (APPROX)  . Cystoscopy w/ ureteral stent placement Right 07/10/2012    Procedure: CYSTOSCOPY WITH RIGHT RETROGRADE PYELOGRAM/URETERAL STENT PLACEMENT;  Surgeon: Anner Crete, MD;  Location: [facility];  Service: Urology;  Laterality: Right;   Social History:   reports that she quit smoking about 29 years ago. Her smoking use included Cigarettes. She has a 60 pack-year smoking history. She has never used smokeless tobacco. She reports that she does not drink alcohol or use illicit drugs.  Family History  Problem Relation Age of Onset  . Leukemia    . Cancer Mother   . Cancer Father   . Cancer Brother   . Cancer Daughter     Medications: Patient's Medications  New Prescriptions   No medications on file  Previous Medications   ACYCLOVIR (ZOVIRAX) 200 MG CAPSULE       AMITRIPTYLINE (ELAVIL) 50 MG TABLET    Take 1 tablet (50 mg total) by mouth at bedtime.   ASPIRIN EC 325 MG TABLET    Take 325 mg by mouth daily.    CEPHALEXIN (KEFLEX) 250 MG CAPSULE    Take 1 capsule (250 mg total) by mouth 4 (four) times daily.   GABAPENTIN (NEURONTIN) 300 MG CAPSULE    Take 300 mg by mouth 3 (three) times daily.   INSULIN ASPART (NOVOLOG) 100 UNIT/ML INJECTION    Inject 0-15 Units into the skin as needed. Sugar level less than 150 do not take any Novolog, if sugar level is between 151-200 take 3 units, 201-250 take 5 units, 251-300 take 7 units 301-350 take 9 units, 351-400 take 11 units, sugar level over 400 take 15 units and notify docotor   INSULIN DETEMIR (LEVEMIR) 100 UNIT/ML INJECTION    Inject 0.3 mLs (30 Units total) into the skin at bedtime.   MELOXICAM (MOBIC) 7.5 MG TABLET    Take 1 tablet (7.5 mg total) by mouth daily. Take one tablet once a day for arthritis    METOPROLOL TARTRATE (LOPRESSOR) 12.5 MG TABS    Take 12.5 mg by mouth 2 (two) times daily.   MULTIPLE VITAMIN (MULTIVITAMIN WITH MINERALS) TABS    Take 1 tablet by mouth daily.   PREDNISONE (DELTASONE) 1 MG TABLET    Take 3 mg by mouth every morning.   TRAMADOL (ULTRAM) 50 MG TABLET    Take 50 mg by mouth every 8 (eight) hours as needed. Pain  Modified Medications   No medications on file  Discontinued Medications   PHENAZOPYRIDINE (PYRIDIUM) 100 MG TABLET    Take 1 tablet (100 mg total) by mouth 3 (three) times daily as needed for pain.     Physical Exam:  Filed Vitals:   07/24/12  1358  BP: 118/72  Pulse: 90  Temp: 97.2 F (36.2 C)  TempSrc: Oral  Resp: 22  Height: 5\' 1"  (1.549 m)  Weight: 150 lb (68.04 kg)  SpO2: 95%   Physical Exam  Constitutional: She is well-developed, well-nourished, and in no distress. No distress.  Cardiovascular: Normal rate and regular rhythm.   Pulmonary/Chest: Effort normal and breath sounds normal.  Abdominal: Soft. Bowel sounds are normal. She exhibits no distension. There is no tenderness.  Musculoskeletal: Normal range of motion. She exhibits no edema and no tenderness.  Skin: Skin is warm and dry. She is not diaphoretic.    J Stent drain to right mid back   Labs reviewed: Basic Metabolic Panel:  Recent Labs  57/84/69 1805  06/10/12 0514 06/11/12 0457 06/19/12 1707 07/10/12 0816  NA 135  < > 128* 132* 137 142  K 4.6  < > 4.3 4.4 6.0* 3.9  CL 95*  < > 93* 95* 98 101  CO2 26  < > 29 28 28   --   GLUCOSE 194*  < > 194* 133* 128* 88  BUN 49*  < > 23 18 30* 31*  CREATININE 1.89*  < > 0.79 0.70 1.24* 1.20*  CALCIUM 9.5  < > 9.5 9.5 10.0  --   TSH 0.979  --   --   --   --   --   < > = values in this interval not displayed. Liver Function Tests:  Recent Labs  05/17/12 1633 05/30/12 1805  AST 27 20  ALT 18 17  ALKPHOS 91 121*  BILITOT 0.6 0.4  PROT 7.1 7.0  ALBUMIN 3.1* 3.9    Recent Labs  03/20/12 1008  LIPASE 10*   No  results found for this basename: AMMONIA,  in the last 8760 hours CBC:  Recent Labs  05/17/12 1633  06/06/12 2153  06/08/12 0425 06/09/12 0522 06/10/12 0514 07/10/12 0816  WBC 8.5  < > 11.4*  < > 9.2 9.4 10.6*  --   NEUTROABS 5.9  --  9.2*  --   --   --   --   --   HGB 11.7*  < > 12.2  < > 11.2* 10.7* 11.6* 11.9*  HCT 36.0  < > 36.3  < > 32.7* 31.8* 34.5* 35.0*  MCV 94.5  < > 89.9  < > 89.8 90.1 90.1  --   PLT 159  --  176  < > 129* 122* 125*  --   < > = values in this interval not displayed. Lipid Panel: No results found for this basename: CHOL, HDL, LDLCALC, TRIG, CHOLHDL, LDLDIRECT,  in the last 8760 hours   Assessment/Plan  1.   Anemia 285.9     With blood tinged urine- will get CBC   2.   Diabetes mellitus 250.00     Does not have log and has not been taking blood sugars and using novolog as prescribed- will get A1c today and to follow up with blood sugar readings at next visit.    3.   Muscle weakness (generalized) 728.87     Encouraged pt to cont exercises done by therapy; to use cane at home to help with mobility and prevent falls    4.   Other and unspecified hyperlipidemia   Will follow up fasting lipids before next visit   5. conts to follow with urology for right renal urine leak and j stents

## 2012-07-24 NOTE — Patient Instructions (Signed)
To follow up in 3 months for EV with [facility] with lab work before visit   Please bring all medications to next visit. Bring blood sugar log Use walker at home to prevent falls   Cardiac Diet This diet can help prevent heart disease and stroke. Many factors influence your heart health, including eating and exercise habits. Coronary risk rises a lot with abnormal blood fat (lipid) levels. Cardiac meal planning includes limiting unhealthy fats, increasing healthy fats, and making other small dietary changes. General guidelines are as follows:  Adjust calorie intake to reach and maintain desirable body weight.  Limit total fat intake to less than 30% of total calories. Saturated fat should be less than 7% of calories.  Saturated fats are found in animal products and in some vegetable products. Saturated vegetable fats are found in coconut oil, cocoa butter, palm oil, and palm kernel oil. Read labels carefully to avoid these products as much as possible. Use butter in moderation. Choose tub margarines and oils that have 2 grams of fat or less. Good cooking oils are canola and olive oils.  Practice low-fat cooking techniques. Do not fry food. Instead, broil, bake, boil, steam, grill, roast on a rack, stir-fry, or microwave it. Other fat reducing suggestions include:  Remove the skin from poultry.  Remove all visible fat from meats.  Skim the fat off stews, soups, and gravies before serving them.  Steam vegetables in water or broth instead of sauting them in fat.  Avoid foods with trans fat (or hydrogenated oils), such as commercially fried foods and commercially baked goods. Commercial shortening and deep-frying fats will contain trans fat.  Increase intake of fruits, vegetables, whole grains, and legumes to replace foods high in fat.  Increase consumption of nuts, legumes, and seeds to at least 4 servings weekly. One serving of a legume equals  cup, and 1 serving of nuts or seeds equals   cup.  Choose whole grains more often. Have 3 servings per day (a serving is 1 ounce [oz]).  Eat 4 to 5 servings of vegetables per day. A serving of vegetables is 1 cup of raw leafy vegetables;  cup of raw or cooked cut-up vegetables;  cup of vegetable juice.  Eat 4 to 5 servings of fruit per day. A serving of fruit is 1 medium whole fruit;  cup of dried fruit;  cup of fresh, frozen, or canned fruit;  cup of 100% fruit juice.  Increase your intake of dietary fiber to 20 to 30 grams per day. Insoluble fiber may help lower your risk of heart disease and may help curb your appetite. Soluble fiber binds cholesterol to be removed from the blood. Foods high in soluble fiber are dried beans, citrus fruits, oats, apples, bananas, broccoli, Brussels sprouts, and eggplant.  Try to include foods fortified with plant sterols or stanols, such as yogurt, breads, juices, or margarines. Choose several fortified foods to achieve a daily intake of 2 to 3 grams of plant sterols or stanols.  Foods with omega-3 fats can help reduce your risk of heart disease. Aim to have a 3.5 oz portion of fatty fish twice per week, such as salmon, mackerel, albacore tuna, sardines, lake trout, or herring. If you wish to take a fish oil supplement, choose one that contains 1 gram of both DHA and EPA.  Limit processed meats to 2 servings (3 oz portion) weekly.  Limit the sodium in your diet to 1500 milligrams (mg) per day. If you have high  blood pressure, talk to a registered dietitian about a DASH (Dietary Approaches to Stop Hypertension) eating plan.  Limit sweets and beverages with added sugar, such as soda, to no more than 5 servings per week. One serving is:   1 tablespoon sugar.  1 tablespoon jelly or jam.   cup sorbet.  1 cup lemonade.   cup regular soda. CHOOSING FOODS Starches  Allowed: Breads: All kinds (wheat, rye, raisin, white, oatmeal, [place], [place], and English muffin bread). Low-fat rolls: English  muffins, frankfurter and hamburger buns, bagels, pita bread, tortillas (not fried). Pancakes, waffles, biscuits, and muffins made with recommended oil.  Avoid: Products made with saturated or trans fats, oils, or whole milk products. Butter rolls, cheese breads, croissants. Commercial doughnuts, muffins, sweet rolls, biscuits, waffles, pancakes, store-bought mixes. Crackers  Allowed: Low-fat crackers and snacks: Animal, graham, rye, saltine (with recommended oil, no lard), oyster, and matzo crackers. Bread sticks, melba toast, rusks, flatbread, pretzels, and light popcorn.  Avoid: High-fat crackers: cheese crackers, butter crackers, and those made with coconut, palm oil, or trans fat (hydrogenated oils). Buttered popcorn. Cereals  Allowed: Hot or cold whole-grain cereals.  Avoid: Cereals containing coconut, hydrogenated vegetable fat, or animal fat. Potatoes / Pasta / Rice  Allowed: All kinds of potatoes, rice, and pasta (such as macaroni, spaghetti, and noodles).  Avoid: Pasta or rice prepared with cream sauce or high-fat cheese. Chow mein noodles, [place] fries. Vegetables  Allowed: All vegetables and vegetable juices.  Avoid: Fried vegetables. Vegetables in cream, butter, or high-fat cheese sauces. Limit coconut. Fruit in cream or custard. Protein  Allowed: Limit your intake of meat, seafood, and poultry to no more than 6 oz (cooked weight) per day. All lean, well-trimmed beef, veal, pork, and lamb. All chicken and [place] without skin. All fish and shellfish. Wild game: wild duck, rabbit, pheasant, and venison. Egg whites or low-cholesterol egg substitutes may be used as desired. Meatless dishes: recipes with dried beans, peas, lentils, and tofu (soybean curd). Seeds and nuts: all seeds and most nuts.  Avoid: Prime grade and other heavily marbled and fatty meats, such as short ribs, spare ribs, rib eye roast or steak, frankfurters, sausage, bacon, and high-fat luncheon meats, mutton.  Caviar. Commercially fried fish. Domestic duck, goose, venison sausage. Organ meats: liver, gizzard, heart, chitterlings, brains, kidney, sweetbreads. Dairy  Allowed: Low-fat cheeses: nonfat or low-fat cottage cheese (1% or 2% fat), cheeses made with part skim milk, such as mozzarella, farmers, string, or ricotta. (Cheeses should be labeled no more than 2 to 6 grams fat per oz.). Skim (or 1%) milk: liquid, powdered, or evaporated. Buttermilk made with low-fat milk. Drinks made with skim or low-fat milk or cocoa. Chocolate milk or cocoa made with skim or low-fat (1%) milk. Nonfat or low-fat yogurt.  Avoid: Whole milk cheeses, including colby, cheddar, [place], [address], [place], [place], [place], [address], [place], and blue. Creamed cottage cheese, cream cheese. Whole milk and whole milk products, including buttermilk or yogurt made from whole milk, drinks made from whole milk. Condensed milk, evaporated whole milk, and 2% milk. Soups and Combination Foods  Allowed: Low-fat low-sodium soups: broth, dehydrated soups, homemade broth, soups with the fat removed, homemade cream soups made with skim or low-fat milk. Low-fat spaghetti, lasagna, chili, and Spanish rice if low-fat ingredients and low-fat cooking techniques are used.  Avoid: Cream soups made with whole milk, cream, or high-fat cheese. All other soups. Desserts and Sweets  Allowed: Sherbet, fruit ices, gelatins, meringues, and angel food cake. Homemade desserts  with recommended fats, oils, and milk products. Jam, jelly, honey, marmalade, sugars, and syrups. Pure sugar candy, such as gum drops, hard candy, jelly beans, marshmallows, mints, and small amounts of dark chocolate.  Avoid: Commercially prepared cakes, pies, cookies, frosting, pudding, or mixes for these products. Desserts containing whole milk products, chocolate, coconut, lard, palm oil, or palm kernel oil. Ice cream or ice cream drinks. Candy that contains chocolate, coconut,  butter, hydrogenated fat, or unknown ingredients. Buttered syrups. Fats and Oils  Allowed: Vegetable oils: safflower, sunflower, corn, soybean, cottonseed, sesame, canola, olive, or peanut. Non-hydrogenated margarines. Salad dressing or mayonnaise: homemade or commercial, made with a recommended oil. Low or nonfat salad dressing or mayonnaise.  Limit added fats and oils to 6 to 8 tsp per day (includes fats used in cooking, baking, salads, and spreads on bread). Remember to count the "hidden fats" in foods.  Avoid: Solid fats and shortenings: butter, lard, salt pork, bacon drippings. Gravy containing meat fat, shortening, or suet. Cocoa butter, coconut. Coconut oil, palm oil, palm kernel oil, or hydrogenated oils: these ingredients are often used in bakery products, nondairy creamers, whipped toppings, candy, and commercially fried foods. Read labels carefully. Salad dressings made of unknown oils, sour cream, or cheese, such as blue cheese and Ro[place]ef[place]. Cream, all kinds: half-and-half, light, heavy, or whipping. Sour cream or cream cheese (even if "light" or low-fat). Nondairy cream substitutes: coffee creamers and sour cream substitutes made with palm, palm kernel, hydrogenated oils, or coconut oil. Beverages  Allowed: Coffee (regular or decaffeinated), tea. Diet carbonated beverages, mineral water. Alcohol: Check with your caregiver. Moderation is recommended.  Avoid: Whole milk, regular sodas, and juice drinks with added sugar. Condiments  Allowed: All seasonings and condiments. Cocoa powder. "Cream" sauces made with recommended ingredients.  Avoid: Carob powder made with hydrogenated fats. SAMPLE MENU Breakfast   cup orange juice   cup oatmeal  1 slice toast  1 tsp margarine  1 cup skim milk Lunch  [place] sandwich with 2 oz [place], 2 slices bread  Lettuce and tomato slices  Fresh fruit  Carrot sticks  Coffee or tea Snack  Fresh fruit or low-fat crackers Dinner  3  oz lean ground beef  1 baked potato  1 tsp margarine   cup asparagus  Lettuce salad  1 tbs non-creamy dressing   cup peach slices  1 cup skim milk Document Released: 12/01/2007 Document Revised: 08/23/2011 Document Reviewed: 05/17/2011 [facility] Patient Information 2013 [place], [place].

## 2012-07-25 LAB — BASIC METABOLIC PANEL
BUN/Creatinine Ratio: 23 (ref 11–26)
Chloride: 99 mmol/L (ref 97–108)
GFR calc Af Amer: 59 mL/min/{1.73_m2} — ABNORMAL LOW (ref >59)
Potassium: 5.1 mmol/L (ref 3.5–5.2)
Sodium: 140 mmol/L (ref 134–144)

## 2012-07-25 LAB — CBC WITH DIFFERENTIAL/PLATELET
Basophils Absolute: 0 10*3/uL (ref 0.0–0.2)
Eosinophils Absolute: 0.1 10*3/uL (ref 0.0–0.4)
HCT: 35.5 % (ref 34.0–46.6)
Hemoglobin: 11.8 g/dL (ref 11.1–15.9)
Lymphocytes Absolute: 1.6 10*3/uL (ref 0.7–3.1)
MCH: 31.1 pg (ref 26.6–33.0)
MCHC: 33.2 g/dL (ref 31.5–35.7)
Neutrophils Absolute: 7.2 10*3/uL — ABNORMAL HIGH (ref 1.4–7.0)
RDW: 16.8 % — ABNORMAL HIGH (ref 12.3–15.4)

## 2012-07-25 LAB — HEMOGLOBIN A1C: Est. average glucose Bld gHb Est-mCnc: 154 mg/dL

## 2012-07-31 ENCOUNTER — Encounter: Payer: Self-pay | Admitting: Internal Medicine

## 2012-07-31 ENCOUNTER — Ambulatory Visit (INDEPENDENT_AMBULATORY_CARE_PROVIDER_SITE_OTHER): Payer: Medicare PPO | Admitting: Internal Medicine

## 2012-07-31 VITALS — BP 126/78 | HR 99 | Ht 61.0 in | Wt 157.5 lb

## 2012-07-31 DIAGNOSIS — N390 Urinary tract infection, site not specified: Secondary | ICD-10-CM

## 2012-07-31 DIAGNOSIS — N151 Renal and perinephric abscess: Secondary | ICD-10-CM

## 2012-07-31 NOTE — Progress Notes (Signed)
Patient ID: [name], female   DOB: 03-29-1924, 79 y.o.   MRN: [id number]         [facility] for Infectious Disease  Patient Active Problem List   Diagnosis Date Noted  . Renal abscess, right 06/11/2012    Priority: High  . SIRS (systemic inflammatory response syndrome) 06/07/2012    Priority: High  . Recurrent UTI 06/07/2012    Priority: Medium  . Retroperitoneal bleed 03/20/2012    Priority: Medium  . Renal oncocytoma 01/12/2011    Priority: Medium  . Hemoperitoneum (nontraumatic) 06/19/2012  . Malignant neoplasm of renal pelvis 06/19/2012  . Dysphagia, unspecified 06/19/2012  . Urinary frequency 06/19/2012  . Unspecified late effects of cerebrovascular disease 06/19/2012  . Personal history of fall 06/19/2012  . Abnormality of gait 06/19/2012  . Synovial cyst of popliteal space 06/19/2012  . Muscle weakness (generalized) 06/19/2012  . Osteoporosis, unspecified 06/19/2012  . Anxiety state, unspecified 06/19/2012  . Osteoarthrosis, unspecified whether generalized or localized, unspecified site 06/19/2012  . Urinary tract infection, site not specified 06/19/2012  . Anemia, unspecified 06/19/2012  . Pain in limb 06/19/2012  . Knee joint replacement by other means 06/19/2012  . Pain in joint, lower leg 06/19/2012  . Other and unspecified hyperlipidemia 06/19/2012  . Cervicalgia 06/19/2012  . Lack of coordination 06/19/2012  . Unspecified essential hypertension 06/19/2012  . Myalgia and myositis, unspecified 06/19/2012  . Pain in joint, shoulder region 06/19/2012  . Dermatophytosis of nail 06/19/2012  . Unspecified urinary incontinence 06/19/2012  . Herpes zoster without mention of complication 06/19/2012  . Unspecified hereditary and idiopathic peripheral neuropathy 06/19/2012  . Osteoarthrosis, unspecified whether generalized or localized, lower leg 06/19/2012  . Lumbago 06/19/2012  . Enthesopathy of hip region 06/19/2012  . Internal hemorrhoids without  mention of complication 06/19/2012  . Diaphragmatic hernia without mention of obstruction or gangrene 06/19/2012  . AKI (acute kidney injury) 06/07/2012  . CHF (congestive heart failure) 03/20/2012  . 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 Degenerative arthritis of cervical spine 02/09/2011  . Degenerative arthritis of hip 02/09/2011  . Degenerative arthritis of knee 02/09/2011  . Carpal tunnel syndrome of right wrist 02/09/2011  . TIA (transient ischemic attack) 02/09/2011  . Volume overload 02/01/2011  . Anemia 01/31/2011  . Hypoxia 01/31/2011  . Pneumonia 01/11/2011  . Diabetes mellitus 01/11/2011  . HTN (hypertension) 01/11/2011  . Polymyalgia rheumatica 01/11/2011  . Depression 01/11/2011    Patient's Medications  New Prescriptions   No medications on file  Previous Medications   ACYCLOVIR (ZOVIRAX) 200 MG CAPSULE       AMITRIPTYLINE (ELAVIL) 50 MG TABLET    Take 1 tablet (50 mg total) by mouth at bedtime.   ASPIRIN EC 325 MG TABLET    Take 325 mg by mouth daily.    GABAPENTIN (NEURONTIN) 300 MG CAPSULE    Take 300 mg by mouth 3 (three) times daily.   INSULIN ASPART (NOVOLOG) 100 UNIT/ML INJECTION    Inject 0-15 Units into the skin as needed. Sugar level less than 150 do not take any Novolog, if sugar level is between 151-200 take 3 units, 201-250 take 5 units, 251-300 take 7 units 301-350 take 9 units, 351-400 take 11 units, sugar level over 400 take 15 units and notify docotor   INSULIN DETEMIR (LEVEMIR) 100 UNIT/ML INJECTION    Inject 0.3 mLs (30 Units total) into the skin at bedtime.   MELOXICAM (MOBIC) 7.5 MG TABLET    Take 1 tablet (7.5 mg  total) by mouth daily. Take one tablet once a day for arthritis   METOPROLOL TARTRATE (LOPRESSOR) 12.5 MG TABS    Take 12.5 mg by mouth 2 (two) times daily.   MULTIPLE VITAMIN (MULTIVITAMIN WITH MINERALS) TABS    Take 1 tablet by mouth daily.   PREDNISONE (DELTASONE) 1 MG TABLET    Take 3 mg by mouth every morning.   TRAMADOL (ULTRAM) 50  MG TABLET    Take 1 tablet (50 mg total) by mouth every 8 (eight) hours as needed. Pain  Modified Medications   No medications on file  Discontinued Medications   CEPHALEXIN (KEFLEX) 250 MG CAPSULE    Take 1 capsule (250 mg total) by mouth 4 (four) times daily.    Subjective: [name] is in for her routine followup visit. After her visit with me last month she went back to the hospital because she was having a large volume of fluid out her right kidney drain. There was concern that she had developed a urinary fistula and was actually draining large amounts of urine. She had a double-J stent placed and a Foley catheter. The right kidney drain was able to be removed last week. She believes she finished her cephalexin last week as well. She is feeling markedly better, the best she has felt in several years.  Review of Systems: Pertinent items are noted in HPI.  Past Medical History  Diagnosis Date  . HTN (hypertension)   . Osteoporosis   . 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 COPD (chronic obstructive pulmonary disease)     no inhalers used   . PONV (postoperative nausea and vomiting)     in past, none recently  . History of TIA (transient ischemic attack) 2012--  NO RESIDUAL  . CHF (congestive heart failure)   . Arthritis     BACK , NECK, AND JOINTS  . Temporal arteritis   . Chronic steroid use   . 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 Macular degeneration of both eyes   . Diabetic peripheral neuropathy   . Insulin dependent diabetes mellitus   . Chronic cystitis   . Hemoperitoneum (nontraumatic)   . Malignant neoplasm of renal pelvis   . Urinary frequency   . Unspecified late effects of cerebrovascular disease   . Abnormality of gait   . Osteoporosis, unspecified   . Anxiety state, unspecified   . Herpes zoster with other nervous system complications   . Anemia, unspecified   . Type I (juvenile  type) diabetes mellitus without mention of complication, uncontrolled   . Other and unspecified hyperlipidemia   . Personality change due to conditions classified elsewhere   . Unspecified essential hypertension   . Dermatophytosis of nail   . Depressive disorder, not elsewhere classified   . Diaphragmatic hernia without mention of obstruction or gangrene     History  Substance Use Topics  . Smoking status: Former Smoker -- 2.00 packs/day for 30 years    Types: Cigarettes    Quit date: 01/11/1983  . Smokeless tobacco: Never Used  . Alcohol Use: No    Family History  Problem Relation Age of Onset  . Leukemia    . Cancer Mother   . Cancer Father   . Cancer Brother   . Cancer Daughter     Allergies  Allergen Reactions  . Allegra (Fexofenadine Hcl)   . Codeine Nausea And Vomiting  . Levaquin (  Levofloxacin In D5w)   . Morphine And Related Nausea And Vomiting  . Tussionex Pennkinetic Er (Hydrocod Polst-Cpm Polst Er)     Objective: BP: 126/78 mmHg (05/27 1008) Pulse Rate: 99 (05/27 1008)  General: She is smiling and in good spirits. Skin: No rash Lungs: Clear Cor: Regular S1 and S2 no murmurs Abdomen: Soft and nontender. She has a clean dry gauze dressing over her flank previous drain site. She has no CVA tenderness She has any Foley catheter in place   Assessment: I believe that her right kidney Escherichia coli abscess has probably been cured. As best I can tell she probably received a total of 6 weeks of antibiotic therapy. She is doing well and I would recommend continued observation off of antibiotics.  Plan: 1. Continue observation off of antibiotics 2. Followup here as needed   Cliffton Asters, MD [facility] for Infectious Disease [facility] [phone] pager   [phone] cell 07/31/2012, 10:19 AM

## 2012-08-02 ENCOUNTER — Emergency Department (HOSPITAL_COMMUNITY): Payer: Medicare PPO

## 2012-08-02 ENCOUNTER — Inpatient Hospital Stay (HOSPITAL_COMMUNITY)
Admission: EM | Admit: 2012-08-02 | Discharge: 2012-08-06 | DRG: 470 | Disposition: A | Discharge status: Skilled Nursing Facility | Payer: Medicare PPO | Attending: Internal Medicine | Admitting: Internal Medicine

## 2012-08-02 ENCOUNTER — Encounter (HOSPITAL_COMMUNITY): Payer: Self-pay | Admitting: *Deleted

## 2012-08-02 DIAGNOSIS — F3289 Other specified depressive episodes: Secondary | ICD-10-CM | POA: Diagnosis present

## 2012-08-02 DIAGNOSIS — M542 Cervicalgia: Secondary | ICD-10-CM

## 2012-08-02 DIAGNOSIS — N179 Acute kidney failure, unspecified: Secondary | ICD-10-CM

## 2012-08-02 DIAGNOSIS — S72001A Fracture of unspecified part of neck of right femur, initial encounter for closed fracture: Secondary | ICD-10-CM

## 2012-08-02 DIAGNOSIS — R0902 Hypoxemia: Secondary | ICD-10-CM

## 2012-08-02 DIAGNOSIS — F32A Depression, unspecified: Secondary | ICD-10-CM

## 2012-08-02 DIAGNOSIS — E1165 Type 2 diabetes mellitus with hyperglycemia: Secondary | ICD-10-CM | POA: Diagnosis present

## 2012-08-02 DIAGNOSIS — K648 Other hemorrhoids: Secondary | ICD-10-CM

## 2012-08-02 DIAGNOSIS — E1142 Type 2 diabetes mellitus with diabetic polyneuropathy: Secondary | ICD-10-CM | POA: Diagnosis present

## 2012-08-02 DIAGNOSIS — K661 Hemoperitoneum: Secondary | ICD-10-CM

## 2012-08-02 DIAGNOSIS — R350 Frequency of micturition: Secondary | ICD-10-CM

## 2012-08-02 DIAGNOSIS — B029 Zoster without complications: Secondary | ICD-10-CM

## 2012-08-02 DIAGNOSIS — R1310 Dysphagia, unspecified: Secondary | ICD-10-CM

## 2012-08-02 DIAGNOSIS — M79609 Pain in unspecified limb: Secondary | ICD-10-CM

## 2012-08-02 DIAGNOSIS — Z9181 History of falling: Secondary | ICD-10-CM

## 2012-08-02 DIAGNOSIS — N390 Urinary tract infection, site not specified: Secondary | ICD-10-CM

## 2012-08-02 DIAGNOSIS — H3530 Unspecified macular degeneration: Secondary | ICD-10-CM | POA: Diagnosis present

## 2012-08-02 DIAGNOSIS — D693 Immune thrombocytopenic purpura: Secondary | ICD-10-CM

## 2012-08-02 DIAGNOSIS — M7120 Synovial cyst of popliteal space [Baker], unspecified knee: Secondary | ICD-10-CM

## 2012-08-02 DIAGNOSIS — G609 Hereditary and idiopathic neuropathy, unspecified: Secondary | ICD-10-CM

## 2012-08-02 DIAGNOSIS — M6281 Muscle weakness (generalized): Secondary | ICD-10-CM

## 2012-08-02 DIAGNOSIS — R279 Unspecified lack of coordination: Secondary | ICD-10-CM

## 2012-08-02 DIAGNOSIS — M25569 Pain in unspecified knee: Secondary | ICD-10-CM

## 2012-08-02 DIAGNOSIS — D649 Anemia, unspecified: Secondary | ICD-10-CM

## 2012-08-02 DIAGNOSIS — E8770 Fluid overload, unspecified: Secondary | ICD-10-CM

## 2012-08-02 DIAGNOSIS — M25519 Pain in unspecified shoulder: Secondary | ICD-10-CM

## 2012-08-02 DIAGNOSIS — I10 Essential (primary) hypertension: Secondary | ICD-10-CM

## 2012-08-02 DIAGNOSIS — E785 Hyperlipidemia, unspecified: Secondary | ICD-10-CM

## 2012-08-02 DIAGNOSIS — M545 Low back pain, unspecified: Secondary | ICD-10-CM

## 2012-08-02 DIAGNOSIS — F329 Major depressive disorder, single episode, unspecified: Secondary | ICD-10-CM | POA: Diagnosis present

## 2012-08-02 DIAGNOSIS — D3001 Benign neoplasm of right kidney: Secondary | ICD-10-CM

## 2012-08-02 DIAGNOSIS — B351 Tinea unguium: Secondary | ICD-10-CM

## 2012-08-02 DIAGNOSIS — M169 Osteoarthritis of hip, unspecified: Secondary | ICD-10-CM

## 2012-08-02 DIAGNOSIS — J189 Pneumonia, unspecified organism: Secondary | ICD-10-CM

## 2012-08-02 DIAGNOSIS — J4489 Other specified chronic obstructive pulmonary disease: Secondary | ICD-10-CM | POA: Diagnosis present

## 2012-08-02 DIAGNOSIS — R000 Tachycardia, unspecified: Secondary | ICD-10-CM | POA: Diagnosis not present

## 2012-08-02 DIAGNOSIS — N3020 Other chronic cystitis without hematuria: Secondary | ICD-10-CM | POA: Diagnosis present

## 2012-08-02 DIAGNOSIS — K683 Retroperitoneal hematoma: Secondary | ICD-10-CM

## 2012-08-02 DIAGNOSIS — IMO0001 Reserved for inherently not codable concepts without codable children: Secondary | ICD-10-CM

## 2012-08-02 DIAGNOSIS — W010XXA Fall on same level from slipping, tripping and stumbling without subsequent striking against object, initial encounter: Secondary | ICD-10-CM

## 2012-08-02 DIAGNOSIS — E1149 Type 2 diabetes mellitus with other diabetic neurological complication: Secondary | ICD-10-CM | POA: Diagnosis present

## 2012-08-02 DIAGNOSIS — W108XXA Fall (on) (from) other stairs and steps, initial encounter: Secondary | ICD-10-CM | POA: Diagnosis present

## 2012-08-02 DIAGNOSIS — G459 Transient cerebral ischemic attack, unspecified: Secondary | ICD-10-CM

## 2012-08-02 DIAGNOSIS — Z794 Long term (current) use of insulin: Secondary | ICD-10-CM

## 2012-08-02 DIAGNOSIS — M1710 Unilateral primary osteoarthritis, unspecified knee: Secondary | ICD-10-CM

## 2012-08-02 DIAGNOSIS — I959 Hypotension, unspecified: Secondary | ICD-10-CM

## 2012-08-02 DIAGNOSIS — F411 Generalized anxiety disorder: Secondary | ICD-10-CM

## 2012-08-02 DIAGNOSIS — Z96659 Presence of unspecified artificial knee joint: Secondary | ICD-10-CM

## 2012-08-02 DIAGNOSIS — IMO0002 Reserved for concepts with insufficient information to code with codable children: Secondary | ICD-10-CM

## 2012-08-02 DIAGNOSIS — C659 Malignant neoplasm of unspecified renal pelvis: Secondary | ICD-10-CM

## 2012-08-02 DIAGNOSIS — Z8673 Personal history of transient ischemic attack (TIA), and cerebral infarction without residual deficits: Secondary | ICD-10-CM

## 2012-08-02 DIAGNOSIS — R32 Unspecified urinary incontinence: Secondary | ICD-10-CM

## 2012-08-02 DIAGNOSIS — G5601 Carpal tunnel syndrome, right upper limb: Secondary | ICD-10-CM

## 2012-08-02 DIAGNOSIS — M179 Osteoarthritis of knee, unspecified: Secondary | ICD-10-CM

## 2012-08-02 DIAGNOSIS — R269 Unspecified abnormalities of gait and mobility: Secondary | ICD-10-CM

## 2012-08-02 DIAGNOSIS — R58 Hemorrhage, not elsewhere classified: Secondary | ICD-10-CM

## 2012-08-02 DIAGNOSIS — I509 Heart failure, unspecified: Secondary | ICD-10-CM | POA: Diagnosis present

## 2012-08-02 DIAGNOSIS — J449 Chronic obstructive pulmonary disease, unspecified: Secondary | ICD-10-CM | POA: Diagnosis present

## 2012-08-02 DIAGNOSIS — Z79899 Other long term (current) drug therapy: Secondary | ICD-10-CM

## 2012-08-02 DIAGNOSIS — Y92009 Unspecified place in unspecified non-institutional (private) residence as the place of occurrence of the external cause: Secondary | ICD-10-CM

## 2012-08-02 DIAGNOSIS — D3000 Benign neoplasm of unspecified kidney: Secondary | ICD-10-CM | POA: Diagnosis present

## 2012-08-02 DIAGNOSIS — M353 Polymyalgia rheumatica: Secondary | ICD-10-CM

## 2012-08-02 DIAGNOSIS — M810 Age-related osteoporosis without current pathological fracture: Secondary | ICD-10-CM

## 2012-08-02 DIAGNOSIS — I6990 Unspecified sequelae of unspecified cerebrovascular disease: Secondary | ICD-10-CM

## 2012-08-02 DIAGNOSIS — N151 Renal and perinephric abscess: Secondary | ICD-10-CM

## 2012-08-02 DIAGNOSIS — M1990 Unspecified osteoarthritis, unspecified site: Secondary | ICD-10-CM

## 2012-08-02 DIAGNOSIS — E119 Type 2 diabetes mellitus without complications: Secondary | ICD-10-CM

## 2012-08-02 DIAGNOSIS — S72009A Fracture of unspecified part of neck of unspecified femur, initial encounter for closed fracture: Principal | ICD-10-CM

## 2012-08-02 DIAGNOSIS — M76899 Other specified enthesopathies of unspecified lower limb, excluding foot: Secondary | ICD-10-CM

## 2012-08-02 DIAGNOSIS — M47812 Spondylosis without myelopathy or radiculopathy, cervical region: Secondary | ICD-10-CM

## 2012-08-02 DIAGNOSIS — R6510 Systemic inflammatory response syndrome (SIRS) of non-infectious origin without acute organ dysfunction: Secondary | ICD-10-CM

## 2012-08-02 DIAGNOSIS — K449 Diaphragmatic hernia without obstruction or gangrene: Secondary | ICD-10-CM

## 2012-08-02 DIAGNOSIS — I5032 Chronic diastolic (congestive) heart failure: Secondary | ICD-10-CM

## 2012-08-02 LAB — COMPREHENSIVE METABOLIC PANEL
Albumin: 2.9 g/dL — ABNORMAL LOW (ref 3.5–5.2)
BUN: 18 mg/dL (ref 6–23)
CO2: 29 mEq/L (ref 19–32)
Chloride: 100 mEq/L (ref 96–112)
Creatinine, Ser: 0.84 mg/dL (ref 0.50–1.10)
GFR calc Af Amer: 71 mL/min — ABNORMAL LOW (ref >90)
GFR calc non Af Amer: 62 mL/min — ABNORMAL LOW (ref >90)
Total Bilirubin: 0.4 mg/dL (ref 0.3–1.2)

## 2012-08-02 LAB — CBC
HCT: 29.4 % — ABNORMAL LOW (ref 36.0–46.0)
MCV: 94.2 fL (ref 78.0–100.0)
RDW: 16.3 % — ABNORMAL HIGH (ref 11.5–15.5)
WBC: 6 10*3/uL (ref 4.0–10.5)

## 2012-08-02 LAB — GLUCOSE, CAPILLARY

## 2012-08-02 MED ORDER — CHLORHEXIDINE GLUCONATE 0.12 % MT SOLN
15.0000 mL | Freq: Two times a day (BID) | OROMUCOSAL | Status: DC
  Administered 2012-08-03 – 2012-08-06 (×7): 15 mL via OROMUCOSAL
  Filled 2012-08-02 (×10): qty 15

## 2012-08-02 MED ORDER — FUROSEMIDE 20 MG PO TABS
20.0000 mg | ORAL_TABLET | Freq: Every day | ORAL | Status: DC
  Administered 2012-08-03: 20 mg via ORAL
  Filled 2012-08-02 (×2): qty 1

## 2012-08-02 MED ORDER — INSULIN DETEMIR 100 UNIT/ML ~~LOC~~ SOLN
10.0000 [IU] | Freq: Every day | SUBCUTANEOUS | Status: DC
  Administered 2012-08-04 (×2): 10 [IU] via SUBCUTANEOUS
  Filled 2012-08-02 (×3): qty 0.1

## 2012-08-02 MED ORDER — TRAMADOL HCL 50 MG PO TABS
50.0000 mg | ORAL_TABLET | Freq: Three times a day (TID) | ORAL | Status: DC | PRN
  Administered 2012-08-04 – 2012-08-06 (×5): 50 mg via ORAL
  Filled 2012-08-02 (×6): qty 1

## 2012-08-02 MED ORDER — MORPHINE SULFATE 4 MG/ML IJ SOLN
4.0000 mg | Freq: Once | INTRAMUSCULAR | Status: DC
  Filled 2012-08-02: qty 1

## 2012-08-02 MED ORDER — CHLORHEXIDINE GLUCONATE 0.12 % MT SOLN: 15.0000 mL | Freq: Two times a day (BID) | OROMUCOSAL | Status: DC

## 2012-08-02 MED ORDER — GABAPENTIN 300 MG PO CAPS
300.0000 mg | ORAL_CAPSULE | Freq: Three times a day (TID) | ORAL | Status: DC
  Administered 2012-08-02 – 2012-08-06 (×9): 300 mg via ORAL
  Filled 2012-08-02 (×14): qty 1

## 2012-08-02 MED ORDER — HYDROCODONE-ACETAMINOPHEN 5-325 MG PO TABS
1.0000 | ORAL_TABLET | Freq: Four times a day (QID) | ORAL | Status: DC | PRN
  Administered 2012-08-03: 1 via ORAL
  Filled 2012-08-02 (×2): qty 1

## 2012-08-02 MED ORDER — HYDROMORPHONE HCL PF 1 MG/ML IJ SOLN: 0.5000 mg | INTRAMUSCULAR | Status: DC | PRN

## 2012-08-02 MED ORDER — ADULT MULTIVITAMIN W/MINERALS CH
1.0000 | ORAL_TABLET | Freq: Every day | ORAL | Status: DC
  Administered 2012-08-04 – 2012-08-06 (×3): 1 via ORAL
  Filled 2012-08-02 (×4): qty 1

## 2012-08-02 MED ORDER — INSULIN ASPART 100 UNIT/ML ~~LOC~~ SOLN
0.0000 [IU] | Freq: Three times a day (TID) | SUBCUTANEOUS | Status: DC
  Administered 2012-08-03: 2 [IU] via SUBCUTANEOUS
  Administered 2012-08-04: 1 [IU] via SUBCUTANEOUS
  Administered 2012-08-04 – 2012-08-05 (×4): 2 [IU] via SUBCUTANEOUS

## 2012-08-02 MED ORDER — ONDANSETRON HCL 4 MG/2ML IJ SOLN: 4.0000 mg | Freq: Three times a day (TID) | INTRAMUSCULAR | Status: DC | PRN

## 2012-08-02 MED ORDER — AMITRIPTYLINE HCL 50 MG PO TABS
50.0000 mg | ORAL_TABLET | Freq: Every day | ORAL | Status: DC
  Administered 2012-08-02: 50 mg via ORAL
  Filled 2012-08-02 (×3): qty 1

## 2012-08-02 MED ORDER — HYDROMORPHONE HCL PF 1 MG/ML IJ SOLN
0.5000 mg | INTRAMUSCULAR | Status: DC | PRN
  Administered 2012-08-02 – 2012-08-03 (×5): 0.5 mg via INTRAVENOUS
  Filled 2012-08-02 (×5): qty 1

## 2012-08-02 MED ORDER — BIOTENE DRY MOUTH MT LIQD
15.0000 mL | Freq: Two times a day (BID) | OROMUCOSAL | Status: DC
  Administered 2012-08-03 – 2012-08-06 (×5): 15 mL via OROMUCOSAL

## 2012-08-02 MED ORDER — ENOXAPARIN SODIUM 30 MG/0.3ML ~~LOC~~ SOLN
30.0000 mg | SUBCUTANEOUS | Status: DC
  Administered 2012-08-02 – 2012-08-04 (×2): 30 mg via SUBCUTANEOUS
  Filled 2012-08-02 (×3): qty 0.3

## 2012-08-02 MED ORDER — SODIUM CHLORIDE 0.9 % IV SOLN
INTRAVENOUS | Status: DC
  Administered 2012-08-02 (×2): via INTRAVENOUS

## 2012-08-02 MED ORDER — ONDANSETRON HCL 4 MG/2ML IJ SOLN
4.0000 mg | Freq: Once | INTRAMUSCULAR | Status: DC
  Filled 2012-08-02: qty 2

## 2012-08-02 MED ORDER — PREDNISONE 1 MG PO TABS
3.0000 mg | ORAL_TABLET | Freq: Every day | ORAL | Status: DC
  Administered 2012-08-03 – 2012-08-06 (×4): 3 mg via ORAL
  Filled 2012-08-02 (×6): qty 3

## 2012-08-02 MED ORDER — DOCUSATE SODIUM 100 MG PO CAPS
100.0000 mg | ORAL_CAPSULE | Freq: Two times a day (BID) | ORAL | Status: DC
  Administered 2012-08-02 – 2012-08-06 (×6): 100 mg via ORAL
  Filled 2012-08-02 (×6): qty 1

## 2012-08-02 MED ORDER — HYDROCODONE-ACETAMINOPHEN 5-325 MG PO TABS
1.0000 | ORAL_TABLET | ORAL | Status: DC | PRN
  Administered 2012-08-02: 1 via ORAL
  Filled 2012-08-02: qty 1

## 2012-08-02 NOTE — H&P (Addendum)
[facility] History and Physical  [name] JXB:[id number] DOB: 1924/09/17 DOA: 08/02/2012  Referring physician: Dr Denton Lank.  PCP: [name], MD  Specialists: Dr Ophelia Charter.   Chief Complaint: fall, right side hip pain.   HPI: [name] is a 79 y.o. female with PMH significant for HTN, Diastolic Heart Failure grade 1, last EF at 65 to 70 % by ECHO 2012, Diabetes,  Renal oncocytoma, right renal leak, sp right double-J stent 2014 who presents after a mechanical fall and was found to have a right femoral neck fracture. Patient relates she was going down the stair  when she missed a step and fell. She hit right side. She presents complaining of right side hip pain and shoulder pain. She denies loss of consciousness. She denies chest pain or dyspnea at rest or on exertion. She is active and able to ambulate. She denies nausea, vomiting, diarrhea.     Review of Systems: Negative except as per HPI.   Past Medical History  Diagnosis Date  . HTN (hypertension)   . Osteoporosis   . 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 COPD (chronic obstructive pulmonary disease)     no inhalers used   . PONV (postoperative nausea and vomiting)     in past, none recently  . History of TIA (transient ischemic attack) 2012--  NO RESIDUAL  . CHF (congestive heart failure)   . Arthritis     BACK , NECK, AND JOINTS  . Temporal arteritis   . Chronic steroid use   . 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 Macular degeneration of both eyes   . Diabetic peripheral neuropathy   . Insulin dependent diabetes mellitus   . Chronic cystitis   . Hemoperitoneum (nontraumatic)   . Malignant neoplasm of renal pelvis   . Urinary frequency   . Unspecified late effects of cerebrovascular disease   . Abnormality of gait   . Osteoporosis, unspecified   . Anxiety state, unspecified   . Herpes zoster with other nervous system  complications(053.19)   . Anemia, unspecified   . Type I (juvenile type) diabetes mellitus without mention of complication, uncontrolled   . Other and unspecified hyperlipidemia   . Personality change due to conditions classified elsewhere   . Unspecified essential hypertension   . Dermatophytosis of nail   . Depressive disorder, not elsewhere classified   . Diaphragmatic hernia without mention of obstruction or gangrene    Past Surgical History  Procedure Laterality Date  . Lumbar laminectomy  11-19-2003    L3 -- L4  . Total hip arthroplasty Left 01-22-2005  . Total knee arthroplasty Bilateral LEFT 08-08-2007;  RIGHT 05-08-2009  . Anterior cervical decomp/discectomy fusion  10-12-2005    C5 -- C7  . Posterior fusion cervical spine  02-10-2006    C5 -- C7  . Orif w/ plate right distal radius fx  06-18-2003  . Cataract extraction w/ intraocular lens  implant, bilateral    . Cryoablation right renal mass  03-16-2012    ONCOCYTOMA (POST OP 06-09-2012 PERCUTANEOUS DRAIN PLACEMENT FOR PERINEPHRIC ABSCESS)  . Temporal artery biopsy / ligation    . Elbow surgery Bilateral 2003  (APPROX)  . Carpal tunnel release Right 2009  (APPROX)  . Cystoscopy w/ ureteral stent placement Right 07/10/2012    Procedure: CYSTOSCOPY WITH RIGHT RETROGRADE PYELOGRAM/URETERAL STENT PLACEMENT;  Surgeon: Anner Crete, MD;  Location: [facility];  Service: Urology;  Laterality: Right;   Social History:  reports that she quit smoking about 29 years ago. Her smoking use included Cigarettes. She has a 60 pack-year smoking history. She has never used smokeless tobacco. She reports that she does not drink alcohol or use illicit drugs.   Allergies  Allergen Reactions  . Allegra (Fexofenadine Hcl)   . Codeine Nausea And Vomiting  . Levaquin (Levofloxacin In D5w)   . Morphine And Related Nausea And Vomiting  . Tussionex Pennkinetic Er (Hydrocod Polst-Cpm Polst Er)     Family History  Problem Relation  Age of Onset  . Leukemia    . Cancer Mother   . Cancer Father   . Cancer Brother   . Cancer Daughter     Prior to Admission medications   Medication Sig Start Date End Date Taking? Authorizing Provider  acyclovir (ZOVIRAX) 200 MG capsule Pt takes on Sunday, Wednesday,and Saturday 06/24/12  Yes [name], MD  amitriptyline (ELAVIL) 50 MG tablet Take 50 mg by mouth at bedtime.   Yes Historical Provider, MD  aspirin EC 325 MG tablet Take 325 mg by mouth daily.    Yes Historical Provider, MD  furosemide (LASIX) 20 MG tablet Take 20 mg by mouth daily.   Yes Historical Provider, MD  gabapentin (NEURONTIN) 300 MG capsule Take 300 mg by mouth 3 (three) times daily. 02/07/11  Yes Adeline C Viyuoh, MD  insulin aspart (NOVOLOG) 100 UNIT/ML injection Inject 0-15 Units into the skin as needed. Sugar level less than 150 do not take any Novolog, if sugar level is between 151-200 take 3 units, 201-250 take 5 units, 251-300 take 7 units 301-350 take 9 units, 351-400 take 11 units, sugar level over 400 take 15 units and notify docotor 02/07/11  Yes Adeline C Viyuoh, MD  insulin detemir (LEVEMIR) 100 UNIT/ML injection Inject 30 Units into the skin at bedtime.   Yes Historical Provider, MD  Multiple Vitamin (MULTIVITAMIN WITH MINERALS) TABS Take 1 tablet by mouth daily.   Yes Historical Provider, MD  predniSONE (DELTASONE) 1 MG tablet Take 3 mg by mouth every morning. 02/22/11  Yes Adeline Joselyn Glassman, MD  traMADol (ULTRAM) 50 MG tablet Take 50 mg by mouth every 8 (eight) hours as needed for pain.   Yes Historical Provider, MD   Physical Exam: Filed Vitals:   08/02/12 1514 08/02/12 1522  BP: 117/73   Pulse: 86   Temp: 98.1 F (36.7 C)   TempSrc: Oral   Resp: 16   SpO2: 88% 97%    General Appearance:    Alert, cooperative, no distress, appears stated age  Head:    Normocephalic, without obvious abnormality, atraumatic  Eyes:    PERRL, conjunctiva/corneas clear, EOM's intact,     Ears:    Normal TM's  and external ear canals, both ears  Nose:   Nares normal, septum midline, mucosa normal, no drainage    or sinus tenderness  Throat:   Lips, mucosa, and tongue normal; teeth and gums normal  Neck:   Supple, symmetrical, trachea midline, no adenopathy;    thyroid:  no enlargement/tenderness/nodules; no carotid   bruit or JVD  Back:     Symmetric, no curvature, ROM normal, no CVA tenderness  Lungs:     Clear to auscultation bilaterally, respirations unlabored      Heart:    Regular rate and rhythm, S1 and S2 normal, no murmur, rub   or gallop     Abdomen:  Soft, non-tender, bowel sounds active all four quadrants,    no masses, no organomegaly        Extremities:   Left Extremity normal, atraumatic, no cyanosis or edema, right LE with mild angulation, pain palpation right hip.   Pulses:   2+ and symmetric all extremities  Skin:   Skin color, texture, turgor normal, no rashes or lesions  Lymph nodes:   Cervical, supraclavicular, and axillary nodes normal  Neurologic:   CNII-XII intact, normal strength, sensation and reflexes    throughout   Labs on Admission:  Basic Metabolic Panel:  Recent Labs Lab 08/02/12 1604  NA 136  K 4.7  CL 100  CO2 29  GLUCOSE 164*  BUN 18  CREATININE 0.84  CALCIUM 8.9   Liver Function Tests:  Recent Labs Lab 08/02/12 1604  AST 25  ALT 26  ALKPHOS 90  BILITOT 0.4  PROT 5.9*  ALBUMIN 2.9*   No results found for this basename: LIPASE, AMYLASE,  in the last 168 hours No results found for this basename: AMMONIA,  in the last 168 hours CBC:  Recent Labs Lab 08/02/12 1604  WBC 6.0  HGB 9.4*  HCT 29.4*  MCV 94.2  PLT 143*   Cardiac Enzymes: No results found for this basename: CKTOTAL, CKMB, CKMBINDEX, TROPONINI,  in the last 168 hours  BNP (last 3 results)  Recent Labs  03/20/12 1008  PROBNP 1166.0*   CBG: No results found for this basename: GLUCAP,  in the last 168 hours  Radiological Exams on Admission: Dg Chest 1  View  08/02/2012   *RADIOLOGY REPORT*  Clinical Data: Preoperative evaluation prior to repair of right hip fracture.  CHEST - 1 VIEW  Comparison: Chest x-ray 05/17/2012.  Findings: Chronic scarring at the left base is unchanged. Lung volumes are normal.  No consolidative airspace disease.  No pleural effusions.  No pneumothorax.  No pulmonary nodule or mass noted. Pulmonary vasculature and the cardiomediastinal silhouette are within normal limits.  Atherosclerosis of the thoracic aorta. Orthopedic fixation hardware in the lower cervical spine incompletely visualized.  IMPRESSION: 1.  No radiographic evidence of acute cardiopulmonary disease. 2.  Atherosclerosis. 3.  Chronic scarring at the left base is unchanged.   Original Report Authenticated By: Trudie Reed, M.D.   Dg Shoulder Right  08/02/2012   *RADIOLOGY REPORT*  Clinical Data: History of fall complaining of right shoulder pain.  RIGHT SHOULDER - 2+ VIEW  Comparison: No priors.  Findings: Three views of the right shoulder demonstrate no acute displaced fracture, subluxation, dislocation, joint or soft tissue abnormality.  IMPRESSION: 1.  No acute radiographic abnormality of the right shoulder.   Original Report Authenticated By: Trudie Reed, M.D.   Dg Hip Complete Right  08/02/2012   *RADIOLOGY REPORT*  Clinical Data: History of fall complaining of right-sided hip pain.  RIGHT HIP - COMPLETE 2+ VIEW  Comparison: No priors.  Findings: AP view of the pelvis and AP and lateral views of the right hip demonstrate an acute mildly displaced transcervical right femoral neck fracture.  There appears to be mild superolateral migration of the distal fracture fragment, and although the lateral view is suboptimal, there is approximately 30 degrees of dorsal angulation.  The bony pelvis itself appears intact.  Postoperative changes of left total hip arthroplasty are noted.  The distal end of a right-sided double-J ureteral stent is also noted.  IMPRESSION: 1.   Acute mildly displaced and angulated transcervical right femoral neck fracture, as above.  Original Report Authenticated By: Trudie Reed, M.D.    EKG: Independently reviewed. Sinus rhythm.   Assessment/Plan Principal Problem:   Fracture of femoral neck, right, closed Active Problems:   Diabetes mellitus   HTN (hypertension)   Immune thrombocytopenia   CHF (congestive heart failure)   Anemia, unspecified  1-Right hip Fracture: Admit to med-surgery bed. Patient at moderate to high risk for surgery due to age and multiple comorbidities. Ok to proceed with surgery. EKG sinus rhythm. Patient denies chest pain or dyspnea on exertion. Compensated Heart Failure. Ortho consulted.  2-Chronic Diastolic Heart Failure: Compensated. Continue with lasix.  3-Diabetes: decrease Levemir, patient will be NPO after midnight. Will order SSI.  4-History of immune thrombocytopenia: Platelet level stable. Continue with prednisone.  5-Anemia: hb baseline 10. Repeat hb in am.    Code Status: Full Code.  Family Communication: none at bedside.  Disposition Plan: expect 2 to 3 days inpatient.   Time spent: 75 minutes.   Jacinda Kanady [facility] Pager [phone]  If 7PM-7AM, please contact night-coverage [email or web address] Password [facility] 08/02/2012, 5:58 PM

## 2012-08-02 NOTE — ED Provider Notes (Addendum)
History     CSN: [id number]  Arrival date & time 08/02/12  1459   First MD Initiated Contact with Patient 08/02/12 1526      Chief complaint: fall, hip pain   (Consider location/radiation/quality/duration/timing/severity/associated sxs/prior treatment) The history is provided by the patient.  pt s/p fall at home, states was walking down 4 steps, holding handrail, when she became distracted by something she saw, and missed the last step, falling onto right hip. No loc. C/o constant right hip pain, moderate, dull, worse w movement. hasnt tried to stand/walk since fall. No neck or back pain. No cp or sob. No abd pain. No nv. Skin intact, no abrasions/lacs. Pt also notes has had right shoulder pain even prior to fall, constant, dull, ?prior injury, requests xray. Pt denies other pain or injury.      Past Medical History  Diagnosis Date  . HTN (hypertension)   . Osteoporosis   . 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 COPD (chronic obstructive pulmonary disease)     no inhalers used   . PONV (postoperative nausea and vomiting)     in past, none recently  . History of TIA (transient ischemic attack) 2012--  NO RESIDUAL  . CHF (congestive heart failure)   . Arthritis     BACK , NECK, AND JOINTS  . Temporal arteritis   . Chronic steroid use   . 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 Macular degeneration of both eyes   . Diabetic peripheral neuropathy   . Insulin dependent diabetes mellitus   . Chronic cystitis   . Hemoperitoneum (nontraumatic)   . Malignant neoplasm of renal pelvis   . Urinary frequency   . Unspecified late effects of cerebrovascular disease   . Abnormality of gait   . Osteoporosis, unspecified   . Anxiety state, unspecified   . Herpes zoster with other nervous system complications(053.19)   . Anemia, unspecified   . Type I (juvenile type) diabetes mellitus without mention of  complication, uncontrolled   . Other and unspecified hyperlipidemia   . Personality change due to conditions classified elsewhere   . Unspecified essential hypertension   . Dermatophytosis of nail   . Depressive disorder, not elsewhere classified   . Diaphragmatic hernia without mention of obstruction or gangrene     Past Surgical History  Procedure Laterality Date  . Lumbar laminectomy  11-19-2003    L3 -- L4  . Total hip arthroplasty Left 01-22-2005  . Total knee arthroplasty Bilateral LEFT 08-08-2007;  RIGHT 05-08-2009  . Anterior cervical decomp/discectomy fusion  10-12-2005    C5 -- C7  . Posterior fusion cervical spine  02-10-2006    C5 -- C7  . Orif w/ plate right distal radius fx  06-18-2003  . Cataract extraction w/ intraocular lens  implant, bilateral    . Cryoablation right renal mass  03-16-2012    ONCOCYTOMA (POST OP 06-09-2012 PERCUTANEOUS DRAIN PLACEMENT FOR PERINEPHRIC ABSCESS)  . Temporal artery biopsy / ligation    . Elbow surgery Bilateral 2003  (APPROX)  . Carpal tunnel release Right 2009  (APPROX)  . Cystoscopy w/ ureteral stent placement Right 07/10/2012    Procedure: CYSTOSCOPY WITH RIGHT RETROGRADE PYELOGRAM/URETERAL STENT PLACEMENT;  Surgeon: Anner Crete, MD;  Location: [facility];  Service: Urology;  Laterality: Right;    Family History  Problem Relation Age of Onset  . Leukemia    . Cancer  Mother   . Cancer Father   . Cancer Brother   . Cancer Daughter     History  Substance Use Topics  . Smoking status: Former Smoker -- 2.00 packs/day for 30 years    Types: Cigarettes    Quit date: 01/11/1983  . Smokeless tobacco: Never Used  . Alcohol Use: No    OB History   Grav Para Term Preterm Abortions TAB SAB Ect Mult Living                  Review of Systems  Constitutional: Negative for fever and chills.  HENT: Negative for neck pain.   Eyes: Negative for visual disturbance.  Respiratory: Negative for cough and shortness of  breath.   Cardiovascular: Negative for chest pain.  Gastrointestinal: Negative for vomiting and abdominal pain.  Genitourinary: Negative for flank pain.  Musculoskeletal: Negative for back pain.  Skin: Negative for rash.  Neurological: Negative for weakness, numbness and headaches.  Hematological: Does not bruise/bleed easily.  Psychiatric/Behavioral: Negative for confusion.    Allergies  Allegra; Codeine; Levaquin; Morphine and related; and Tussionex pennkinetic er  Home Medications   Current Outpatient Rx  Name  Route  Sig  Dispense  Refill  . acyclovir (ZOVIRAX) 200 MG capsule               . aspirin EC 325 MG tablet   Oral   Take 325 mg by mouth daily.          Marland Kitchen gabapentin (NEURONTIN) 300 MG capsule   Oral   Take 300 mg by mouth 3 (three) times daily.         . insulin aspart (NOVOLOG) 100 UNIT/ML injection   Subcutaneous   Inject 0-15 Units into the skin as needed. Sugar level less than 150 do not take any Novolog, if sugar level is between 151-200 take 3 units, 201-250 take 5 units, 251-300 take 7 units 301-350 take 9 units, 351-400 take 11 units, sugar level over 400 take 15 units and notify docotor         . meloxicam (MOBIC) 7.5 MG tablet   Oral   Take 1 tablet (7.5 mg total) by mouth daily. Take one tablet once a day for arthritis   90 tablet   3   . Multiple Vitamin (MULTIVITAMIN WITH MINERALS) TABS   Oral   Take 1 tablet by mouth daily.   30 tablet   0   . predniSONE (DELTASONE) 1 MG tablet   Oral   Take 3 mg by mouth every morning.         . traMADol (ULTRAM) 50 MG tablet   Oral   Take 1 tablet (50 mg total) by mouth every 8 (eight) hours as needed. Pain   90 tablet   3     BP 117/73  Pulse 86  Temp(Src) 98.1 F (36.7 C) (Oral)  Resp 16  SpO2 97%  Physical Exam  Nursing note and vitals reviewed. Constitutional: She is oriented to person, place, and time. She appears well-developed and well-nourished. No distress.  HENT:   Head: Atraumatic.  Eyes: Conjunctivae are normal. Pupils are equal, round, and reactive to light. No scleral icterus.  Neck: Normal range of motion. Neck supple. No tracheal deviation present.  Cardiovascular: Normal rate, regular rhythm, normal heart sounds and intact distal pulses.   Pulmonary/Chest: Effort normal and breath sounds normal. No respiratory distress. She exhibits no tenderness.  Abdominal: Soft. Normal appearance and bowel sounds are  normal. She exhibits no distension. There is no tenderness.  Genitourinary:  No cva tenderness  Musculoskeletal: She exhibits tenderness. She exhibits no edema.  CTLS spine, non tender, aligned, no step off. Right hip tenderness and pain w rom. No other focal pain or bony tenderness  Neurological: She is alert and oriented to person, place, and time.  Skin: Skin is warm and dry. No rash noted.  Psychiatric: She has a normal mood and affect.    ED Course  Procedures (including critical care time)  Results for orders placed during the hospital encounter of 08/02/12  CBC      Result Value Range   WBC 6.0  4.0 - 10.5 K/uL   RBC 3.12 (*) 3.87 - 5.11 MIL/uL   Hemoglobin 9.4 (*) 12.0 - 15.0 g/dL   HCT 95.2 (*) 84.1 - 32.4 %   MCV 94.2  78.0 - 100.0 fL   MCH 30.1  26.0 - 34.0 pg   MCHC 32.0  30.0 - 36.0 g/dL   RDW 40.1 (*) 02.7 - 25.3 %   Platelets 143 (*) 150 - 400 K/uL  COMPREHENSIVE METABOLIC PANEL      Result Value Range   Sodium 136  135 - 145 mEq/L   Potassium 4.7  3.5 - 5.1 mEq/L   Chloride 100  96 - 112 mEq/L   CO2 29  19 - 32 mEq/L   Glucose, Bld 164 (*) 70 - 99 mg/dL   BUN 18  6 - 23 mg/dL   Creatinine, Ser 6.64  0.50 - 1.10 mg/dL   Calcium 8.9  8.4 - 40.3 mg/dL   Total Protein 5.9 (*) 6.0 - 8.3 g/dL   Albumin 2.9 (*) 3.5 - 5.2 g/dL   AST 25  0 - 37 U/L   ALT 26  0 - 35 U/L   Alkaline Phosphatase 90  39 - 117 U/L   Total Bilirubin 0.4  0.3 - 1.2 mg/dL   GFR calc non Af Amer 62 (*) >90 mL/min   GFR calc Af Amer 71 (*)  >90 mL/min   Dg Shoulder Right  08/02/2012   *RADIOLOGY REPORT*  Clinical Data: History of fall complaining of right shoulder pain.  RIGHT SHOULDER - 2+ VIEW  Comparison: No priors.  Findings: Three views of the right shoulder demonstrate no acute displaced fracture, subluxation, dislocation, joint or soft tissue abnormality.  IMPRESSION: 1.  No acute radiographic abnormality of the right shoulder.   Original Report Authenticated By: Trudie Reed, M.D.   Dg Hip Complete Right  08/02/2012   *RADIOLOGY REPORT*  Clinical Data: History of fall complaining of right-sided hip pain.  RIGHT HIP - COMPLETE 2+ VIEW  Comparison: No priors.  Findings: AP view of the pelvis and AP and lateral views of the right hip demonstrate an acute mildly displaced transcervical right femoral neck fracture.  There appears to be mild superolateral migration of the distal fracture fragment, and although the lateral view is suboptimal, there is approximately 30 degrees of dorsal angulation.  The bony pelvis itself appears intact.  Postoperative changes of left total hip arthroplasty are noted.  The distal end of a right-sided double-J ureteral stent is also noted.  IMPRESSION: 1.  Acute mildly displaced and angulated transcervical right femoral neck fracture, as above.   Original Report Authenticated By: Trudie Reed, M.D.       MDM  Iv ns. Morphine iv. Xrays.  Reviewed nursing notes and prior charts for additional history.   Recheck pain improved.  Pt states her orthopedist is DrYates, paged to consult - he will see, states probable surgery tomorrow afternoon.   Paged hospitalist to admit.     Date: 08/02/2012  Rate: 83  Rhythm: normal sinus rhythm  QRS Axis: normal  Intervals: normal  ST/T Wave abnormalities: normal  Conduction Disutrbances:none  Narrative Interpretation:   Old EKG Reviewed: unchanged        Suzi Roots, MD 08/02/12 1707  Suzi Roots, MD 08/02/12 1610

## 2012-08-02 NOTE — ED Notes (Signed)
Pt arrives by EMS from home, reports fall down 2 steps, pt landed on right hip. Pt c/o pain to right hip and right thigh. Denies hitting head or LOC. Pt arrives on KED. Pt was given fentanyl pta.

## 2012-08-02 NOTE — ED Notes (Signed)
Pt states she is allergic to Morphine so refused pain med also Pt states EMS just gave her pain medication in route. Pt also refused Zofran she stated she would like to wait for her xray results to come back before taking.

## 2012-08-02 NOTE — ED Notes (Signed)
Bed:WA04<BR> Expected date:<BR> Expected time:<BR> Means of arrival:<BR> Comments:<BR> EMS

## 2012-08-02 NOTE — ED Notes (Signed)
MD at bedside. 

## 2012-08-03 ENCOUNTER — Encounter (HOSPITAL_COMMUNITY): Payer: Self-pay | Admitting: *Deleted

## 2012-08-03 ENCOUNTER — Encounter (HOSPITAL_COMMUNITY)
Admission: EM | Disposition: A | Discharge status: Skilled Nursing Facility | Payer: Self-pay | Source: Home / Self Care | Attending: Internal Medicine

## 2012-08-03 ENCOUNTER — Inpatient Hospital Stay (HOSPITAL_COMMUNITY): Payer: Medicare PPO | Admitting: *Deleted

## 2012-08-03 ENCOUNTER — Inpatient Hospital Stay (HOSPITAL_COMMUNITY): Payer: Medicare PPO

## 2012-08-03 DIAGNOSIS — I10 Essential (primary) hypertension: Secondary | ICD-10-CM

## 2012-08-03 HISTORY — PX: HIP ARTHROPLASTY: SHX981

## 2012-08-03 LAB — BLOOD GAS, ARTERIAL
Acid-base deficit: 0.1 mmol/L (ref 0.0–2.0)
Bicarbonate: 25.7 mEq/L — ABNORMAL HIGH (ref 20.0–24.0)
Drawn by: 308601
O2 Content: 2 L/min
O2 Saturation: 88.6 %
Patient temperature: 37
TCO2: 24.1 mmol/L (ref 0–100)
pCO2 arterial: 50.6 mmHg — ABNORMAL HIGH (ref 35.0–45.0)
pH, Arterial: 7.327 — ABNORMAL LOW (ref 7.350–7.450)
pO2, Arterial: 61.8 mmHg — ABNORMAL LOW (ref 80.0–100.0)

## 2012-08-03 LAB — URINALYSIS, ROUTINE W REFLEX MICROSCOPIC
Bilirubin Urine: NEGATIVE
Ketones, ur: 15 mg/dL — AB
Nitrite: POSITIVE — AB
pH: 6.5 (ref 5.0–8.0)

## 2012-08-03 LAB — BASIC METABOLIC PANEL
BUN: 15 mg/dL (ref 6–23)
Calcium: 8.8 mg/dL (ref 8.4–10.5)
GFR calc Af Amer: 86 mL/min — ABNORMAL LOW (ref >90)
GFR calc non Af Amer: 74 mL/min — ABNORMAL LOW (ref >90)
Potassium: 4.1 mEq/L (ref 3.5–5.1)
Sodium: 136 mEq/L (ref 135–145)

## 2012-08-03 LAB — URINE MICROSCOPIC-ADD ON

## 2012-08-03 LAB — GLUCOSE, CAPILLARY
Glucose-Capillary: 161 mg/dL — ABNORMAL HIGH (ref 70–99)
Glucose-Capillary: 115 mg/dL — ABNORMAL HIGH (ref 70–99)

## 2012-08-03 LAB — SURGICAL PCR SCREEN: MRSA, PCR: NEGATIVE

## 2012-08-03 LAB — CBC
MCH: 29.7 pg (ref 26.0–34.0)
MCHC: 31.2 g/dL (ref 30.0–36.0)
RDW: 16.3 % — ABNORMAL HIGH (ref 11.5–15.5)

## 2012-08-03 LAB — PREPARE RBC (CROSSMATCH)

## 2012-08-03 SURGERY — HEMIARTHROPLASTY, HIP, DIRECT ANTERIOR APPROACH, FOR FRACTURE
Anesthesia: General | Site: Hip | Laterality: Right | Wound class: Clean

## 2012-08-03 MED ORDER — PROPOFOL 10 MG/ML IV BOLUS
INTRAVENOUS | Status: DC | PRN
  Administered 2012-08-03: 20 mg via INTRAVENOUS
  Administered 2012-08-03: 90 mg via INTRAVENOUS
  Administered 2012-08-03: 20 mg via INTRAVENOUS

## 2012-08-03 MED ORDER — ONDANSETRON HCL 4 MG/2ML IJ SOLN
INTRAMUSCULAR | Status: DC | PRN
  Administered 2012-08-03: 4 mg via INTRAVENOUS

## 2012-08-03 MED ORDER — ACETAMINOPHEN 650 MG RE SUPP: 650.0000 mg | Freq: Four times a day (QID) | RECTAL | Status: DC | PRN

## 2012-08-03 MED ORDER — PHENYLEPHRINE HCL 10 MG/ML IJ SOLN
10.0000 mg | INTRAVENOUS | Status: DC | PRN
  Administered 2012-08-03: 30 ug/min via INTRAVENOUS

## 2012-08-03 MED ORDER — ASPIRIN EC 325 MG PO TBEC: 325.0000 mg | DELAYED_RELEASE_TABLET | Freq: Two times a day (BID) | ORAL | Status: DC

## 2012-08-03 MED ORDER — 0.9 % SODIUM CHLORIDE (POUR BTL) OPTIME
TOPICAL | Status: DC | PRN
  Administered 2012-08-03: 1000 mL

## 2012-08-03 MED ORDER — CEFAZOLIN SODIUM-DEXTROSE 2-3 GM-% IV SOLR
INTRAVENOUS | Status: DC | PRN
  Administered 2012-08-03: 2 g via INTRAVENOUS

## 2012-08-03 MED ORDER — MORPHINE SULFATE 2 MG/ML IJ SOLN
0.5000 mg | INTRAMUSCULAR | Status: DC | PRN
  Administered 2012-08-04: 0.5 mg via INTRAVENOUS
  Filled 2012-08-03: qty 1

## 2012-08-03 MED ORDER — FENTANYL CITRATE 0.05 MG/ML IJ SOLN
INTRAMUSCULAR | Status: DC | PRN
  Administered 2012-08-03: 50 ug via INTRAVENOUS
  Administered 2012-08-03 (×5): 25 ug via INTRAVENOUS

## 2012-08-03 MED ORDER — SODIUM CHLORIDE 0.9 % IR SOLN
Status: DC | PRN
  Administered 2012-08-03: 3000 mL

## 2012-08-03 MED ORDER — PHENOL 1.4 % MT LIQD
1.0000 | OROMUCOSAL | Status: DC | PRN
  Filled 2012-08-03: qty 177

## 2012-08-03 MED ORDER — SODIUM CHLORIDE 0.9 % IV SOLN
INTRAVENOUS | Status: DC
  Administered 2012-08-03: 23:00:00 via INTRAVENOUS

## 2012-08-03 MED ORDER — PROMETHAZINE HCL 25 MG/ML IJ SOLN: 6.2500 mg | INTRAMUSCULAR | Status: DC | PRN

## 2012-08-03 MED ORDER — ONDANSETRON HCL 4 MG PO TABS: 4.0000 mg | ORAL_TABLET | Freq: Four times a day (QID) | ORAL | Status: DC | PRN

## 2012-08-03 MED ORDER — ONDANSETRON HCL 4 MG/2ML IJ SOLN: 4.0000 mg | Freq: Four times a day (QID) | INTRAMUSCULAR | Status: DC | PRN

## 2012-08-03 MED ORDER — ALBUTEROL SULFATE HFA 108 (90 BASE) MCG/ACT IN AERS
INHALATION_SPRAY | RESPIRATORY_TRACT | Status: DC | PRN
  Administered 2012-08-03: 5 via RESPIRATORY_TRACT
  Administered 2012-08-03: 2 via RESPIRATORY_TRACT

## 2012-08-03 MED ORDER — GLUCERNA SHAKE PO LIQD
237.0000 mL | Freq: Two times a day (BID) | ORAL | Status: DC
  Administered 2012-08-04 – 2012-08-06 (×3): 237 mL via ORAL
  Filled 2012-08-03 (×7): qty 237

## 2012-08-03 MED ORDER — LACTATED RINGERS IV SOLN
INTRAVENOUS | Status: DC | PRN
  Administered 2012-08-03: 20:00:00 via INTRAVENOUS

## 2012-08-03 MED ORDER — FENTANYL CITRATE 0.05 MG/ML IJ SOLN: 25.0000 ug | INTRAMUSCULAR | Status: DC | PRN

## 2012-08-03 MED ORDER — CEFAZOLIN SODIUM-DEXTROSE 2-3 GM-% IV SOLR
2.0000 g | Freq: Four times a day (QID) | INTRAVENOUS | Status: AC
  Administered 2012-08-03 – 2012-08-04 (×2): 2 g via INTRAVENOUS
  Filled 2012-08-03 (×2): qty 50

## 2012-08-03 MED ORDER — HYDROCODONE-ACETAMINOPHEN 5-325 MG PO TABS
1.0000 | ORAL_TABLET | Freq: Four times a day (QID) | ORAL | Status: DC | PRN
  Administered 2012-08-04 – 2012-08-05 (×2): 1 via ORAL
  Filled 2012-08-03: qty 1

## 2012-08-03 MED ORDER — SUCCINYLCHOLINE CHLORIDE 20 MG/ML IJ SOLN
INTRAMUSCULAR | Status: DC | PRN
  Administered 2012-08-03: 100 mg via INTRAVENOUS

## 2012-08-03 MED ORDER — CISATRACURIUM BESYLATE (PF) 10 MG/5ML IV SOLN
INTRAVENOUS | Status: DC | PRN
  Administered 2012-08-03: 2 mg via INTRAVENOUS

## 2012-08-03 MED ORDER — MENTHOL 3 MG MT LOZG
1.0000 | LOZENGE | OROMUCOSAL | Status: DC | PRN
  Filled 2012-08-03: qty 9

## 2012-08-03 MED ORDER — CEFAZOLIN SODIUM-DEXTROSE 2-3 GM-% IV SOLR: 2.0000 g | Freq: Once | INTRAVENOUS | Status: DC

## 2012-08-03 MED ORDER — ASPIRIN EC 325 MG PO TBEC
325.0000 mg | DELAYED_RELEASE_TABLET | Freq: Every day | ORAL | Status: DC
  Administered 2012-08-04 – 2012-08-06 (×3): 325 mg via ORAL
  Filled 2012-08-03 (×4): qty 1

## 2012-08-03 MED ORDER — METOCLOPRAMIDE HCL 5 MG/ML IJ SOLN
INTRAMUSCULAR | Status: DC | PRN
  Administered 2012-08-03: 10 mg via INTRAVENOUS

## 2012-08-03 MED ORDER — METOCLOPRAMIDE HCL 10 MG PO TABS: 5.0000 mg | ORAL_TABLET | Freq: Three times a day (TID) | ORAL | Status: DC | PRN

## 2012-08-03 MED ORDER — PHENYLEPHRINE HCL 10 MG/ML IJ SOLN
INTRAMUSCULAR | Status: DC | PRN
  Administered 2012-08-03: 120 ug via INTRAVENOUS
  Administered 2012-08-03 (×3): 80 ug via INTRAVENOUS

## 2012-08-03 MED ORDER — ACETAMINOPHEN 10 MG/ML IV SOLN
1000.0000 mg | Freq: Four times a day (QID) | INTRAVENOUS | Status: DC
  Administered 2012-08-03 – 2012-08-04 (×2): 1000 mg via INTRAVENOUS
  Filled 2012-08-03 (×2): qty 100

## 2012-08-03 MED ORDER — ACETAMINOPHEN 325 MG PO TABS
650.0000 mg | ORAL_TABLET | Freq: Four times a day (QID) | ORAL | Status: DC | PRN
  Administered 2012-08-04: 650 mg via ORAL
  Filled 2012-08-03: qty 2

## 2012-08-03 MED ORDER — MEPERIDINE HCL 25 MG/ML IJ SOLN: 6.2500 mg | INTRAMUSCULAR | Status: DC | PRN

## 2012-08-03 MED ORDER — METOCLOPRAMIDE HCL 5 MG/ML IJ SOLN: 5.0000 mg | Freq: Three times a day (TID) | INTRAMUSCULAR | Status: DC | PRN

## 2012-08-03 MED ORDER — SODIUM CHLORIDE 0.9 % IV BOLUS (SEPSIS)
500.0000 mL | Freq: Once | INTRAVENOUS | Status: AC
  Administered 2012-08-03: 500 mL via INTRAVENOUS

## 2012-08-03 MED ORDER — HYDROCODONE-ACETAMINOPHEN 5-325 MG PO TABS: 1.0000 | ORAL_TABLET | Freq: Four times a day (QID) | ORAL | Status: DC | PRN

## 2012-08-03 SURGICAL SUPPLY — 56 items
BLADE SAW SAG 73X25 THK (BLADE) ×1
BLADE SAW SGTL 73X25 THK (BLADE) ×1 IMPLANT
BRUSH FEMORAL CANAL (MISCELLANEOUS) ×2 IMPLANT
CEMENT BONE DEPUY (Cement) ×4 IMPLANT
CEMENT RESTRICTOR DEPUY SZ 3 (Cement) ×2 IMPLANT
CLOTH BEACON ORANGE TIMEOUT ST (SAFETY) ×2 IMPLANT
DRAPE INCISE IOBAN 66X45 STRL (DRAPES) ×2 IMPLANT
DRAPE ORTHO SPLIT 77X108 STRL (DRAPES) ×2
DRAPE POUCH INSTRU U-SHP 10X18 (DRAPES) ×2 IMPLANT
DRAPE SURG ORHT 6 SPLT 77X108 (DRAPES) ×2 IMPLANT
DRAPE U-SHAPE 47X51 STRL (DRAPES) ×2 IMPLANT
DRAPE WARM FLUID 44X44 (DRAPE) ×2 IMPLANT
DRSG PAD ABDOMINAL 8X10 ST (GAUZE/BANDAGES/DRESSINGS) ×4 IMPLANT
DURAPREP 26ML APPLICATOR (WOUND CARE) ×2 IMPLANT
ELECT BLADE TIP CTD 4 INCH (ELECTRODE) ×2 IMPLANT
ELECT REM PT RETURN 9FT ADLT (ELECTROSURGICAL) ×2
ELECTRODE REM PT RTRN 9FT ADLT (ELECTROSURGICAL) ×1 IMPLANT
EVACUATOR 1/8 PVC DRAIN (DRAIN) IMPLANT
FACESHIELD LNG OPTICON STERILE (SAFETY) IMPLANT
GAUZE XEROFORM 5X9 LF (GAUZE/BANDAGES/DRESSINGS) ×2 IMPLANT
GLOVE BIOGEL PI IND STRL 8 (GLOVE) ×1 IMPLANT
GLOVE BIOGEL PI INDICATOR 8 (GLOVE) ×1
GLOVE ORTHO TXT STRL SZ7.5 (GLOVE) ×2 IMPLANT
GOWN PREVENTION PLUS LG XLONG (DISPOSABLE) ×2 IMPLANT
GOWN STRL REIN 2XL XLG LVL4 (GOWN DISPOSABLE) ×4 IMPLANT
HANDPIECE INTERPULSE COAX TIP (DISPOSABLE) ×1
IMMOBILIZER KNEE 20 (SOFTGOODS) ×2
IMMOBILIZER KNEE 20 THIGH 36 (SOFTGOODS) ×1 IMPLANT
KIT BASIN OR (CUSTOM PROCEDURE TRAY) ×2 IMPLANT
NEEDLE HYPO 22GX1.5 SAFETY (NEEDLE) IMPLANT
NEEDLE MAYO .5 CIRCLE (NEEDLE) ×2 IMPLANT
NS IRRIG 1000ML POUR BTL (IV SOLUTION) ×4 IMPLANT
PACK TOTAL JOINT (CUSTOM PROCEDURE TRAY) ×2 IMPLANT
PASSER SUT SWANSON 36MM LOOP (INSTRUMENTS) ×2 IMPLANT
POSITIONER SURGICAL ARM (MISCELLANEOUS) ×2 IMPLANT
PRESSURIZER FEMORAL UNIV (MISCELLANEOUS) ×2 IMPLANT
SET HNDPC FAN SPRY TIP SCT (DISPOSABLE) ×1 IMPLANT
SPONGE GAUZE 4X4 12PLY (GAUZE/BANDAGES/DRESSINGS) ×2 IMPLANT
SPONGE LAP 18X18 X RAY DECT (DISPOSABLE) IMPLANT
SPONGE LAP 4X18 X RAY DECT (DISPOSABLE) ×2 IMPLANT
STAPLER VISISTAT 35W (STAPLE) ×2 IMPLANT
SUCTION FRAZIER 12FR DISP (SUCTIONS) ×2 IMPLANT
SUT ETHIBOND NAB CT1 #1 30IN (SUTURE) ×8 IMPLANT
SUT VIC AB 0 CT1 27 (SUTURE) ×1
SUT VIC AB 0 CT1 27XBRD ANTBC (SUTURE) ×1 IMPLANT
SUT VIC AB 1 CT1 27 (SUTURE)
SUT VIC AB 1 CT1 27XBRD ANTBC (SUTURE) IMPLANT
SUT VIC AB 1 CTX 36 (SUTURE)
SUT VIC AB 1 CTX36XBRD ANBCTR (SUTURE) IMPLANT
SUT VIC AB 2-0 CT1 36 (SUTURE) ×6 IMPLANT
SYR 30ML LL (SYRINGE) IMPLANT
TAPE CLOTH SURG 6X10 WHT LF (GAUZE/BANDAGES/DRESSINGS) ×2 IMPLANT
TOWEL OR 17X26 10 PK STRL BLUE (TOWEL DISPOSABLE) ×4 IMPLANT
TOWER CARTRIDGE SMART MIX (DISPOSABLE) ×2 IMPLANT
TRAY FOLEY CATH 14FRSI W/METER (CATHETERS) ×2 IMPLANT
WATER STERILE IRR 1500ML POUR (IV SOLUTION) ×2 IMPLANT

## 2012-08-03 NOTE — Interval H&P Note (Signed)
History and Physical Interval Note:  08/03/2012 6:00 PM  [name]  has presented today for surgery, with the diagnosis of RT FEMORAL NECK FRACTURE  The various methods of treatment have been discussed with the patient and family. After consideration of risks, benefits and other options for treatment, the patient has consented to  Procedure(s): CEMENTED MONOPOLAR HIP (Right) as a surgical intervention .  The patient's history has been reviewed, patient examined, no change in status, stable for surgery.  I have reviewed the patient's chart and labs.  Questions were answered to the patient's satisfaction.     Wasim Hurlbut C

## 2012-08-03 NOTE — Progress Notes (Signed)
[facility] PROGRESS NOTE  Assessment/Plan: *Fracture of femoral neck, right, closed: - ortho consult for surgery 5.03.2014. - PT consult after surgery. - carefull with IV fluids during surgery has diastolic heart. - moderate risk for cardiopulmonary complications for surgery risk and benefir discuss with patient. She has agreed to proceed with surgery.  Anemia, unspecified: - Hbg 9.7 monitor  CHF (congestive heart failure): - No JVD lung are clear.  Immune thrombocytopenia: - PLT's stable. - monitor.   Diabetes mellitus: - well control.  HTN (hypertension): - stable.    Code Status: Full Code.  Family Communication: none at bedside.  Disposition Plan: SNF     Consultants:  ORtho  Procedures:  ORIF  Antibiotics:  None  HPI/Subjective: complains of hip pain.  Objective: Filed Vitals:   08/02/12 2211 08/03/12 0126 08/03/12 0425 08/03/12 1000  BP:  119/78 123/81 122/69  Pulse:  101 97 97  Temp:  97.8 F (36.6 C) 97.8 F (36.6 C) 98.8 F (37.1 C)  TempSrc:  Oral Oral Oral  Resp:  16 16 16   Height: 5\' 1"  (1.549 m)     Weight: 67.132 kg (148 lb)     SpO2:  95% 97% 92%    Intake/Output Summary (Last 24 hours) at 08/03/12 1048 Last data filed at 08/03/12 1000  Gross per 24 hour  Intake 246.33 ml  Output   1290 ml  Net -1043.67 ml   Filed Weights   08/02/12 2211  Weight: 67.132 kg (148 lb)    Exam:  General: Alert, awake, oriented x3, in no acute distress.  HEENT: No bruits, no goiter. No JVD Heart: Regular rate and rhythm, without murmurs, rubs, gallops.  Lungs: Good air movement, clear to auscultation Abdomen: Soft, nontender, nondistended, positive bowel sounds.  Neuro: Grossly intact, nonfocal.   Data Reviewed: Basic Metabolic Panel:  Recent Labs Lab 08/02/12 1604 08/03/12 0415  NA 136 136  K 4.7 4.1  CL 100 99  CO2 29 28  GLUCOSE 164* 139*  BUN 18 15  CREATININE 0.84 0.78  CALCIUM 8.9 8.8   Liver Function  Tests:  Recent Labs Lab 08/02/12 1604  AST 25  ALT 26  ALKPHOS 90  BILITOT 0.4  PROT 5.9*  ALBUMIN 2.9*   No results found for this basename: LIPASE, AMYLASE,  in the last 168 hours No results found for this basename: AMMONIA,  in the last 168 hours CBC:  Recent Labs Lab 08/02/12 1604 08/03/12 0415  WBC 6.0 7.4  HGB 9.4* 9.7*  HCT 29.4* 31.1*  MCV 94.2 95.1  PLT 143* 130*   Cardiac Enzymes: No results found for this basename: CKTOTAL, CKMB, CKMBINDEX, TROPONINI,  in the last 168 hours BNP (last 3 results)  Recent Labs  03/20/12 1008  PROBNP 1166.0*   CBG:  Recent Labs Lab 08/02/12 2116 08/03/12 0700  GLUCAP 90 118*    Recent Results (from the past 240 hour(s))  SURGICAL PCR SCREEN     Status: None   Collection Time    08/03/12  7:03 AM      Result Value Range Status   MRSA, PCR NEGATIVE  NEGATIVE Final   Staphylococcus aureus NEGATIVE  NEGATIVE Final   Comment:            The Xpert SA Assay (FDA     approved for NASAL specimens     in patients over 6 years of age),     is one component of     a comprehensive surveillance  program.  Test performance has     been validated by [facility] for patients greater     than or equal to 42 year old.     It is not intended     to diagnose infection nor to     guide or monitor treatment.     Studies: Dg Chest 1 View  08/02/2012   *RADIOLOGY REPORT*  Clinical Data: Preoperative evaluation prior to repair of right hip fracture.  CHEST - 1 VIEW  Comparison: Chest x-ray 05/17/2012.  Findings: Chronic scarring at the left base is unchanged. Lung volumes are normal.  No consolidative airspace disease.  No pleural effusions.  No pneumothorax.  No pulmonary nodule or mass noted. Pulmonary vasculature and the cardiomediastinal silhouette are within normal limits.  Atherosclerosis of the thoracic aorta. Orthopedic fixation hardware in the lower cervical spine incompletely visualized.  IMPRESSION: 1.  No  radiographic evidence of acute cardiopulmonary disease. 2.  Atherosclerosis. 3.  Chronic scarring at the left base is unchanged.   Original Report Authenticated By: Trudie Reed, M.D.   Dg Shoulder Right  08/02/2012   *RADIOLOGY REPORT*  Clinical Data: History of fall complaining of right shoulder pain.  RIGHT SHOULDER - 2+ VIEW  Comparison: No priors.  Findings: Three views of the right shoulder demonstrate no acute displaced fracture, subluxation, dislocation, joint or soft tissue abnormality.  IMPRESSION: 1.  No acute radiographic abnormality of the right shoulder.   Original Report Authenticated By: Trudie Reed, M.D.   Dg Hip Complete Right  08/02/2012   *RADIOLOGY REPORT*  Clinical Data: History of fall complaining of right-sided hip pain.  RIGHT HIP - COMPLETE 2+ VIEW  Comparison: No priors.  Findings: AP view of the pelvis and AP and lateral views of the right hip demonstrate an acute mildly displaced transcervical right femoral neck fracture.  There appears to be mild superolateral migration of the distal fracture fragment, and although the lateral view is suboptimal, there is approximately 30 degrees of dorsal angulation.  The bony pelvis itself appears intact.  Postoperative changes of left total hip arthroplasty are noted.  The distal end of a right-sided double-J ureteral stent is also noted.  IMPRESSION: 1.  Acute mildly displaced and angulated transcervical right femoral neck fracture, as above.   Original Report Authenticated By: Trudie Reed, M.D.    Scheduled Meds: . amitriptyline  50 mg Oral QHS  . antiseptic oral rinse  15 mL Mouth Rinse q12n4p  . chlorhexidine  15 mL Mouth Rinse BID  . docusate sodium  100 mg Oral BID  . enoxaparin (LOVENOX) injection  30 mg Subcutaneous Q24H  . furosemide  20 mg Oral Daily  . gabapentin  300 mg Oral TID  . insulin aspart  0-9 Units Subcutaneous TID WC  . insulin detemir  10 Units Subcutaneous QHS  . multivitamin with minerals  1  tablet Oral Daily  . predniSONE  3 mg Oral Q breakfast   Continuous Infusions: . sodium chloride 20 mL/hr at 08/02/12 2200     Marinda Elk  [facility] Pager [phone]. If 8PM-8AM, please contact night-coverage at [email or web address], password [email or web address] 08/03/2012, 10:48 AM  LOS: 1 day

## 2012-08-03 NOTE — Progress Notes (Signed)
Nutrition Brief Note  Patient identified on the Malnutrition Screening Tool (MST) Report  Body mass index is 27.98 kg/(m^2). Patient meets criteria for Overweight based on current BMI.   Pt is currently NPO. At previous admission pt had poor appetite but, pt states that her appetite improved and she has been eating well for the past 2 months. Weight history shows that pt has maintained weight for the past 2 months. Pt reports that she has no appetite today; RD to order Glucerna shakes PRN if po intake is poor s/p surgery. Labs and medications reviewed.   No nutrition interventions warranted at this time. If nutrition issues arise, please consult RD.   Ian Malkin RD, LDN Inpatient Clinical Dietitian Pager: [phone] After Hours Pager: [phone]

## 2012-08-03 NOTE — Progress Notes (Signed)
Dr August Saucer notified of axillary temp 101.1.  Hypotension and tachycardia.  Aware pt being transfused.  No new orders.

## 2012-08-03 NOTE — Preoperative (Signed)
Beta Blockers   Reason not to administer Beta Blockers:Not Applicable, not on home BB 

## 2012-08-03 NOTE — Consult Note (Signed)
Reason for Consult: fall with right femoral neck displaced fracture Referring Physician: Sunnie Nielsen MD  [name] is an 79 y.o. female.  HPI: 79 yo female known to me for more than 15 yrs who has previous right and left TKA and left THA , cervical fusion all by me. Patient fell today with displaced femoral neck fracture.    Past Medical History  Diagnosis Date  . HTN (hypertension)   . Osteoporosis   . 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 COPD (chronic obstructive pulmonary disease)     no inhalers used   . PONV (postoperative nausea and vomiting)     in past, none recently  . History of TIA (transient ischemic attack) 2012--  NO RESIDUAL  . CHF (congestive heart failure)   . Arthritis     BACK , NECK, AND JOINTS  . Temporal arteritis   . Chronic steroid use   . 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 Macular degeneration of both eyes   . Diabetic peripheral neuropathy   . Insulin dependent diabetes mellitus   . Chronic cystitis   . Hemoperitoneum (nontraumatic)   . Malignant neoplasm of renal pelvis   . Urinary frequency   . Unspecified late effects of cerebrovascular disease   . Abnormality of gait   . Osteoporosis, unspecified   . Anxiety state, unspecified   . Herpes zoster with other nervous system complications(053.19)   . Anemia, unspecified   . Type I (juvenile type) diabetes mellitus without mention of complication, uncontrolled   . Other and unspecified hyperlipidemia   . Personality change due to conditions classified elsewhere   . Unspecified essential hypertension   . Dermatophytosis of nail   . Depressive disorder, not elsewhere classified   . Diaphragmatic hernia without mention of obstruction or gangrene     Past Surgical History  Procedure Laterality Date  . Lumbar laminectomy  11-19-2003    L3 -- L4  . Total hip arthroplasty Left 01-22-2005  . Total knee arthroplasty  Bilateral LEFT 08-08-2007;  RIGHT 05-08-2009  . Anterior cervical decomp/discectomy fusion  10-12-2005    C5 -- C7  . Posterior fusion cervical spine  02-10-2006    C5 -- C7  . Orif w/ plate right distal radius fx  06-18-2003  . Cataract extraction w/ intraocular lens  implant, bilateral    . Cryoablation right renal mass  03-16-2012    ONCOCYTOMA (POST OP 06-09-2012 PERCUTANEOUS DRAIN PLACEMENT FOR PERINEPHRIC ABSCESS)  . Temporal artery biopsy / ligation    . Elbow surgery Bilateral 2003  (APPROX)  . Carpal tunnel release Right 2009  (APPROX)  . Cystoscopy w/ ureteral stent placement Right 07/10/2012    Procedure: CYSTOSCOPY WITH RIGHT RETROGRADE PYELOGRAM/URETERAL STENT PLACEMENT;  Surgeon: Anner Crete, MD;  Location: [facility];  Service: Urology;  Laterality: Right;    Family History  Problem Relation Age of Onset  . Leukemia    . Cancer Mother   . Cancer Father   . Cancer Brother   . Cancer Daughter     Social History:  reports that she quit smoking about 29 years ago. Her smoking use included Cigarettes. She has a 60 pack-year smoking history. She has never used smokeless tobacco. She reports that she does not drink alcohol or use illicit drugs.  Allergies:  Allergies  Allergen Reactions  . Allegra (Fexofenadine Hcl)   . Codeine Nausea And Vomiting  .  Levaquin (Levofloxacin In D5w)   . Morphine And Related Nausea And Vomiting  . Tussionex Pennkinetic Er (Hydrocod Polst-Cpm Polst Er)     Medications: reviewed by me.   Results for orders placed during the hospital encounter of 08/02/12 (from the past 48 hour(s))  TYPE AND SCREEN     Status: None   Collection Time    08/02/12  4:04 PM      Result Value Range   ABO/RH(D) A NEG     Antibody Screen NEG     Sample Expiration 08/05/2012    CBC     Status: Abnormal   Collection Time    08/02/12  4:04 PM      Result Value Range   WBC 6.0  4.0 - 10.5 K/uL   RBC 3.12 (*) 3.87 - 5.11 MIL/uL   Hemoglobin  9.4 (*) 12.0 - 15.0 g/dL   HCT 16.1 (*) 09.6 - 04.5 %   MCV 94.2  78.0 - 100.0 fL   MCH 30.1  26.0 - 34.0 pg   MCHC 32.0  30.0 - 36.0 g/dL   RDW 40.9 (*) 81.1 - 91.4 %   Platelets 143 (*) 150 - 400 K/uL  COMPREHENSIVE METABOLIC PANEL     Status: Abnormal   Collection Time    08/02/12  4:04 PM      Result Value Range   Sodium 136  135 - 145 mEq/L   Potassium 4.7  3.5 - 5.1 mEq/L   Chloride 100  96 - 112 mEq/L   CO2 29  19 - 32 mEq/L   Glucose, Bld 164 (*) 70 - 99 mg/dL   BUN 18  6 - 23 mg/dL   Creatinine, Ser 7.82  0.50 - 1.10 mg/dL   Calcium 8.9  8.4 - 95.6 mg/dL   Total Protein 5.9 (*) 6.0 - 8.3 g/dL   Albumin 2.9 (*) 3.5 - 5.2 g/dL   AST 25  0 - 37 U/L   ALT 26  0 - 35 U/L   Alkaline Phosphatase 90  39 - 117 U/L   Total Bilirubin 0.4  0.3 - 1.2 mg/dL   GFR calc non Af Amer 62 (*) >90 mL/min   GFR calc Af Amer 71 (*) >90 mL/min   Comment:            The eGFR has been calculated     using the CKD EPI equation.     This calculation has not been     validated in all clinical     situations.     eGFR's persistently     <90 mL/min signify     possible Chronic Kidney Disease.  GLUCOSE, CAPILLARY     Status: None   Collection Time    08/02/12  9:16 PM      Result Value Range   Glucose-Capillary 90  70 - 99 mg/dL   Comment 1 Notify RN     Comment 2 Documented in Chart      Dg Chest 1 View  08/02/2012   *RADIOLOGY REPORT*  Clinical Data: Preoperative evaluation prior to repair of right hip fracture.  CHEST - 1 VIEW  Comparison: Chest x-ray 05/17/2012.  Findings: Chronic scarring at the left base is unchanged. Lung volumes are normal.  No consolidative airspace disease.  No pleural effusions.  No pneumothorax.  No pulmonary nodule or mass noted. Pulmonary vasculature and the cardiomediastinal silhouette are within normal limits.  Atherosclerosis of the thoracic aorta. Orthopedic fixation hardware in  the lower cervical spine incompletely visualized.  IMPRESSION: 1.  No  radiographic evidence of acute cardiopulmonary disease. 2.  Atherosclerosis. 3.  Chronic scarring at the left base is unchanged.   Original Report Authenticated By: Trudie Reed, M.D.   Dg Shoulder Right  08/02/2012   *RADIOLOGY REPORT*  Clinical Data: History of fall complaining of right shoulder pain.  RIGHT SHOULDER - 2+ VIEW  Comparison: No priors.  Findings: Three views of the right shoulder demonstrate no acute displaced fracture, subluxation, dislocation, joint or soft tissue abnormality.  IMPRESSION: 1.  No acute radiographic abnormality of the right shoulder.   Original Report Authenticated By: Trudie Reed, M.D.   Dg Hip Complete Right  08/02/2012   *RADIOLOGY REPORT*  Clinical Data: History of fall complaining of right-sided hip pain.  RIGHT HIP - COMPLETE 2+ VIEW  Comparison: No priors.  Findings: AP view of the pelvis and AP and lateral views of the right hip demonstrate an acute mildly displaced transcervical right femoral neck fracture.  There appears to be mild superolateral migration of the distal fracture fragment, and although the lateral view is suboptimal, there is approximately 30 degrees of dorsal angulation.  The bony pelvis itself appears intact.  Postoperative changes of left total hip arthroplasty are noted.  The distal end of a right-sided double-J ureteral stent is also noted.  IMPRESSION: 1.  Acute mildly displaced and angulated transcervical right femoral neck fracture, as above.   Original Report Authenticated By: Trudie Reed, M.D.    Review of Systems  Constitutional: Negative for fever and weight loss.  HENT:       Previous cervical fusion  Eyes: Negative for blurred vision and photophobia.  Respiratory: Negative for wheezing.   Cardiovascular: Negative for chest pain.  Gastrointestinal: Negative for heartburn and abdominal pain.  Musculoskeletal:       Hx of OA, DDD, cervical fusion, rt. And left TKA doing well. Left THA.  No right hip OA by Hx and xrays  neg for right hip OA  Skin: Negative for rash.  Neurological: Negative for headaches.  Endo/Heme/Allergies:       Pos for diabetes   Blood pressure 120/82, pulse 99, temperature 100.3 F (37.9 C), temperature source Oral, resp. rate 20, height 5\' 1"  (1.549 m), weight 67.132 kg (148 lb), SpO2 96.00%. Physical Exam  Constitutional: She is oriented to person, place, and time. She appears well-developed and well-nourished.  HENT:  Dentures upper and lower  Neck: Normal range of motion.  ACDF scar  Cardiovascular: Normal rate.   Respiratory: Effort normal.  GI: Soft.  Musculoskeletal:  Right leg short and ER  Pulses intact  Neurological: She is alert and oriented to person, place, and time.  Skin: Skin is warm and dry.  Psychiatric: She has a normal mood and affect. Her behavior is normal.    Assessment/Plan: Right femoral neck fracture  Displaced. Plan will be right hip cemented hip hemiarthroplasty. Discussed by phone plan for procedure. Risks of surgery including infection , bleeding, reoperation and dislocation.   She agrees to proceed. Surgery at about 6:30 PM Friday. Will order clear liquid 60 mg breakfast then NPO except for meds with sips.  Will see pt in AM to review again the plan.  She has Hx of E Coli UTI's in the past , will obtain a UA to check and treat if positive.   Levon Boettcher C 08/03/2012, 12:08 AM

## 2012-08-03 NOTE — Progress Notes (Signed)
Dr Malen Gauze called and came to bedside.  Oxygen sats 75-88- no improvement from canula to mask to non-rebreather.  Returned to canula and had intermittent sats 97- 98 then fluctuating to 70's.  Pt responsive and denies and complaints. Orders received and respiratory notified.

## 2012-08-03 NOTE — Clinical Documentation Improvement (Signed)
PATHOLOGICAL FX DOCUMENTATION CLARIFICATION QUERY  THIS DOCUMENT IS NOT A PERMANENT PART OF THE MEDICAL RECORD  TO RESPOND TO THE THIS QUERY, FOLLOW THE INSTRUCTIONS BELOW:  1. If needed, update documentation for the patient's encounter via the notes activity.  2. Access this query again and click edit on the In [employer].  3. After updating, or not, click F2 to complete all highlighted (required) fields concerning your review. Select "additional documentation in the medical record" OR "no additional documentation provided".  4. Click Sign note button.  5. The deficiency will fall out of your In Basket *Please let us know if you are not able to complete this workflow by phone or e-mail (listed below).  Please update your documentation within the medical record to reflect your response to this query.                                                                                    08/03/12  Dear Dr. David Stall, [facility],  In a better effort to capture your patient's severity of illness, reflect appropriate length of stay and utilization of resources, a review of the patient medical record has revealed the following indicators.    Based on your clinical judgment, please clarify and document in a progress note and/or discharge summary the clinical condition associated with the following supporting information:  In responding to this query please exercise your independent judgment.  The fact that a query is asked, does not imply that any particular answer is desired or expected.  Pt with femoral neck fracture.  Clarification Needed   Please clarify if femoral neck fracture and pt's h/o of osteoporosis is linked, thus qualifying as one of the diagnoses listed below.    Possible Clinical Conditions?  _______Pathological Fracture _______Pathological Fracture due to slight trauma that is incongruent with injury _______Osteoporotic Fracture _______Traumatic fracture not  pathological in nature _______Insufficiency Fracture _______Stress/Non-Traumatic Fracture _______Spontaneous Fracture _______Compression Fx due to (please specify bone disease) _______Other Condition________________ _______Cannot Clinically Determine   Supporting Information:  Risk Factors:    Femoral neck fx Thrombocytopenia Chronic Diastolic heart failure Anemia DM HTN Anemia  Signs & Symptoms:    Radiology: Hip complete: 08/02/12 IMPRESSION: 1.Acute mildly displaced and angulated transcervical right femoral neck fracture, as above.  Treatments  Medications:   You may use possible, probable, or suspect with inpatient documentation. possible, probable, suspected diagnoses MUST be documented at the time of discharge  Reviewed:  no additional documentation provided  Thank You,  Enis Slipper  RN, BSN, MSN/Inf, CCDS Clinical Documentation Specialist [facility] Dept Pager: [phone] / E-mail: [email or web address]  Health Information Management [place]

## 2012-08-03 NOTE — Progress Notes (Signed)
Dr Malen Gauze notified temp 101.1 ax pre transfision. Orders received.

## 2012-08-03 NOTE — Anesthesia Preprocedure Evaluation (Signed)
Anesthesia Evaluation  Patient identified by MRN, date of birth, ID band Patient awake    Reviewed: Allergy & Precautions, H&P , NPO status , Patient's Chart, lab work & pertinent test results  History of Anesthesia Complications (+) PONV  Airway Mallampati: II TM Distance: >3 FB Neck ROM: full    Dental  (+) Edentulous Upper, Edentulous Lower and Dental Advisory Given   Pulmonary COPDformer smoker,  Mild COPD breath sounds clear to auscultation  Pulmonary exam normal       Cardiovascular Exercise Tolerance: Good hypertension, Pt. on medications +CHF Rhythm:regular Rate:Normal  Temporal arteritis.  History of volume overload CHF   Neuro/Psych PSYCHIATRIC DISORDERS Anxiety Depression Anterior cervical fusion.  TIA 2012.   Peripheral neuropathy TIAnegative psych ROS   GI/Hepatic negative GI ROS, Neg liver ROS,   Endo/Other  diabetes, Well Controlled, Type 2, Insulin Dependent  Renal/GU negative Renal ROS  negative genitourinary   Musculoskeletal   Abdominal   Peds  Hematology  (+) Blood dyscrasia, anemia , Auto immune thrombocytopenia and neutropenia   Anesthesia Other Findings   Reproductive/Obstetrics negative OB ROS                           Anesthesia Physical  Anesthesia Plan  ASA: III  Anesthesia Plan: General   Post-op Pain Management:    Induction: Intravenous  Airway Management Planned: Oral ETT  Additional Equipment:   Intra-op Plan:   Post-operative Plan:   Informed Consent: I have reviewed the patients History and Physical, chart, labs and discussed the procedure including the risks, benefits and alternatives for the proposed anesthesia with the patient or authorized representative who has indicated his/her understanding and acceptance.   Dental Advisory Given  Plan Discussed with: CRNA and Surgeon  Anesthesia Plan Comments:         Anesthesia Quick  Evaluation

## 2012-08-03 NOTE — Anesthesia Postprocedure Evaluation (Signed)
  Anesthesia Post-op Note  Patient: [name]  Procedure(s) Performed: Procedure(s): CEMENTED MONOPOLAR HIP (Right)  Patient Location: PACU  Anesthesia Type:General  Level of Consciousness: awake and alert   Airway and Oxygen Therapy: Patient Spontanous Breathing and Patient connected to nasal cannula oxygen  Post-op Pain: mild  Post-op Assessment: Post-op Vital signs reviewed, Patient's Cardiovascular Status Stable, Respiratory Function Stable, Patent Airway, No signs of Nausea or vomiting and Pain level controlled. Started transfusion of 1 unit of PRBC's in PACU due to low starting Hgb, intraop blood loss and tachycardia in PACU ( although tachycardia might be related to T=101). ABG done in PACU because of wide fluctuation of SaO2 and difficulty obtaining good waveform on monitor. Due to multiple medical problems and her age have requested stepdown bed and hospitalist to follow her for management of her medical problems.  Post-op Vital Signs: Reviewed and stable  Complications: No apparent anesthesia complications

## 2012-08-03 NOTE — Progress Notes (Signed)
HR remains 115, BP76/48  x2 . Dr Malen Gauze to bedside, fluids opened. Orders received.

## 2012-08-03 NOTE — Progress Notes (Signed)
Dr Malen Gauze at bedside, aware of HR 115-120. No new orders.

## 2012-08-03 NOTE — Progress Notes (Signed)
Dr Claiborne Billings notified of hypotension, tachycardia and temp.  Also awar e pt to be transferred to Step Down per anesthesia. V No new orders

## 2012-08-03 NOTE — H&P (View-Only) (Signed)
Reason for Consult: fall with right femoral neck displaced fracture Referring Physician: Regalado MD  [name] is an 79 y.o. female.  HPI: 79 yo female known to me for more than 15 yrs who has previous right and left TKA and left THA , cervical fusion all by me. Patient fell today with displaced femoral neck fracture.    Past Medical History  Diagnosis Date  . HTN (hypertension)   . Osteoporosis   . 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 COPD (chronic obstructive pulmonary disease)     no inhalers used   . PONV (postoperative nausea and vomiting)     in past, none recently  . History of TIA (transient ischemic attack) 2012--  NO RESIDUAL  . CHF (congestive heart failure)   . Arthritis     BACK , NECK, AND JOINTS  . Temporal arteritis   . Chronic steroid use   . 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 Macular degeneration of both eyes   . Diabetic peripheral neuropathy   . Insulin dependent diabetes mellitus   . Chronic cystitis   . Hemoperitoneum (nontraumatic)   . Malignant neoplasm of renal pelvis   . Urinary frequency   . Unspecified late effects of cerebrovascular disease   . Abnormality of gait   . Osteoporosis, unspecified   . Anxiety state, unspecified   . Herpes zoster with other nervous system complications(053.19)   . Anemia, unspecified   . Type I (juvenile type) diabetes mellitus without mention of complication, uncontrolled   . Other and unspecified hyperlipidemia   . Personality change due to conditions classified elsewhere   . Unspecified essential hypertension   . Dermatophytosis of nail   . Depressive disorder, not elsewhere classified   . Diaphragmatic hernia without mention of obstruction or gangrene     Past Surgical History  Procedure Laterality Date  . Lumbar laminectomy  11-19-2003    L3 -- L4  . Total hip arthroplasty Left 01-22-2005  . Total knee arthroplasty  Bilateral LEFT 08-08-2007;  RIGHT 05-08-2009  . Anterior cervical decomp/discectomy fusion  10-12-2005    C5 -- C7  . Posterior fusion cervical spine  02-10-2006    C5 -- C7  . Orif w/ plate right distal radius fx  06-18-2003  . Cataract extraction w/ intraocular lens  implant, bilateral    . Cryoablation right renal mass  03-16-2012    ONCOCYTOMA (POST OP 06-09-2012 PERCUTANEOUS DRAIN PLACEMENT FOR PERINEPHRIC ABSCESS)  . Temporal artery biopsy / ligation    . Elbow surgery Bilateral 2003  (APPROX)  . Carpal tunnel release Right 2009  (APPROX)  . Cystoscopy w/ ureteral stent placement Right 07/10/2012    Procedure: CYSTOSCOPY WITH RIGHT RETROGRADE PYELOGRAM/URETERAL STENT PLACEMENT;  Surgeon: John J Wrenn, MD;  Location: [facility];  Service: Urology;  Laterality: Right;    Family History  Problem Relation Age of Onset  . Leukemia    . Cancer Mother   . Cancer Father   . Cancer Brother   . Cancer Daughter     Social History:  reports that she quit smoking about 29 years ago. Her smoking use included Cigarettes. She has a 60 pack-year smoking history. She has never used smokeless tobacco. She reports that she does not drink alcohol or use illicit drugs.  Allergies:  Allergies  Allergen Reactions  . Allegra (Fexofenadine Hcl)   . Codeine Nausea And Vomiting  .   Levaquin (Levofloxacin In D5w)   . Morphine And Related Nausea And Vomiting  . Tussionex Pennkinetic Er (Hydrocod Polst-Cpm Polst Er)     Medications: reviewed by me.   Results for orders placed during the hospital encounter of 08/02/12 (from the past 48 hour(s))  TYPE AND SCREEN     Status: None   Collection Time    08/02/12  4:04 PM      Result Value Range   ABO/RH(D) A NEG     Antibody Screen NEG     Sample Expiration 08/05/2012    CBC     Status: Abnormal   Collection Time    08/02/12  4:04 PM      Result Value Range   WBC 6.0  4.0 - 10.5 K/uL   RBC 3.12 (*) 3.87 - 5.11 MIL/uL   Hemoglobin  9.4 (*) 12.0 - 15.0 g/dL   HCT 29.4 (*) 36.0 - 46.0 %   MCV 94.2  78.0 - 100.0 fL   MCH 30.1  26.0 - 34.0 pg   MCHC 32.0  30.0 - 36.0 g/dL   RDW 16.3 (*) 11.5 - 15.5 %   Platelets 143 (*) 150 - 400 K/uL  COMPREHENSIVE METABOLIC PANEL     Status: Abnormal   Collection Time    08/02/12  4:04 PM      Result Value Range   Sodium 136  135 - 145 mEq/L   Potassium 4.7  3.5 - 5.1 mEq/L   Chloride 100  96 - 112 mEq/L   CO2 29  19 - 32 mEq/L   Glucose, Bld 164 (*) 70 - 99 mg/dL   BUN 18  6 - 23 mg/dL   Creatinine, Ser 0.84  0.50 - 1.10 mg/dL   Calcium 8.9  8.4 - 10.5 mg/dL   Total Protein 5.9 (*) 6.0 - 8.3 g/dL   Albumin 2.9 (*) 3.5 - 5.2 g/dL   AST 25  0 - 37 U/L   ALT 26  0 - 35 U/L   Alkaline Phosphatase 90  39 - 117 U/L   Total Bilirubin 0.4  0.3 - 1.2 mg/dL   GFR calc non Af Amer 62 (*) >90 mL/min   GFR calc Af Amer 71 (*) >90 mL/min   Comment:            The eGFR has been calculated     using the CKD EPI equation.     This calculation has not been     validated in all clinical     situations.     eGFR's persistently     <90 mL/min signify     possible Chronic Kidney Disease.  GLUCOSE, CAPILLARY     Status: None   Collection Time    08/02/12  9:16 PM      Result Value Range   Glucose-Capillary 90  70 - 99 mg/dL   Comment 1 Notify RN     Comment 2 Documented in Chart      Dg Chest 1 View  08/02/2012   *RADIOLOGY REPORT*  Clinical Data: Preoperative evaluation prior to repair of right hip fracture.  CHEST - 1 VIEW  Comparison: Chest x-ray 05/17/2012.  Findings: Chronic scarring at the left base is unchanged. Lung volumes are normal.  No consolidative airspace disease.  No pleural effusions.  No pneumothorax.  No pulmonary nodule or mass noted. Pulmonary vasculature and the cardiomediastinal silhouette are within normal limits.  Atherosclerosis of the thoracic aorta. Orthopedic fixation hardware in   the lower cervical spine incompletely visualized.  IMPRESSION: 1.  No  radiographic evidence of acute cardiopulmonary disease. 2.  Atherosclerosis. 3.  Chronic scarring at the left base is unchanged.   Original Report Authenticated By: Daniel Entrikin, M.D.   Dg Shoulder Right  08/02/2012   *RADIOLOGY REPORT*  Clinical Data: History of fall complaining of right shoulder pain.  RIGHT SHOULDER - 2+ VIEW  Comparison: No priors.  Findings: Three views of the right shoulder demonstrate no acute displaced fracture, subluxation, dislocation, joint or soft tissue abnormality.  IMPRESSION: 1.  No acute radiographic abnormality of the right shoulder.   Original Report Authenticated By: Daniel Entrikin, M.D.   Dg Hip Complete Right  08/02/2012   *RADIOLOGY REPORT*  Clinical Data: History of fall complaining of right-sided hip pain.  RIGHT HIP - COMPLETE 2+ VIEW  Comparison: No priors.  Findings: AP view of the pelvis and AP and lateral views of the right hip demonstrate an acute mildly displaced transcervical right femoral neck fracture.  There appears to be mild superolateral migration of the distal fracture fragment, and although the lateral view is suboptimal, there is approximately 30 degrees of dorsal angulation.  The bony pelvis itself appears intact.  Postoperative changes of left total hip arthroplasty are noted.  The distal end of a right-sided double-J ureteral stent is also noted.  IMPRESSION: 1.  Acute mildly displaced and angulated transcervical right femoral neck fracture, as above.   Original Report Authenticated By: Daniel Entrikin, M.D.    Review of Systems  Constitutional: Negative for fever and weight loss.  HENT:       Previous cervical fusion  Eyes: Negative for blurred vision and photophobia.  Respiratory: Negative for wheezing.   Cardiovascular: Negative for chest pain.  Gastrointestinal: Negative for heartburn and abdominal pain.  Musculoskeletal:       Hx of OA, DDD, cervical fusion, rt. And left TKA doing well. Left THA.  No right hip OA by Hx and xrays  neg for right hip OA  Skin: Negative for rash.  Neurological: Negative for headaches.  Endo/Heme/Allergies:       Pos for diabetes   Blood pressure 120/82, pulse 99, temperature 100.3 F (37.9 C), temperature source Oral, resp. rate 20, height 5' 1" (1.549 m), weight 67.132 kg (148 lb), SpO2 96.00%. Physical Exam  Constitutional: She is oriented to person, place, and time. She appears well-developed and well-nourished.  HENT:  Dentures upper and lower  Neck: Normal range of motion.  ACDF scar  Cardiovascular: Normal rate.   Respiratory: Effort normal.  GI: Soft.  Musculoskeletal:  Right leg short and ER  Pulses intact  Neurological: She is alert and oriented to person, place, and time.  Skin: Skin is warm and dry.  Psychiatric: She has a normal mood and affect. Her behavior is normal.    Assessment/Plan: Right femoral neck fracture  Displaced. Plan will be right hip cemented hip hemiarthroplasty. Discussed by phone plan for procedure. Risks of surgery including infection , bleeding, reoperation and dislocation.   She agrees to proceed. Surgery at about 6:30 PM Friday. Will order clear liquid 60 mg breakfast then NPO except for meds with sips.  Will see pt in AM to review again the plan.  She has Hx of E Coli UTI's in the past , will obtain a UA to check and treat if positive.   Malyn Aytes C 08/03/2012, 12:08 AM      

## 2012-08-03 NOTE — Brief Op Note (Signed)
08/02/2012 - 08/03/2012  7:47 PM  PATIENT:  [name]  79 y.o. female  PRE-OPERATIVE DIAGNOSIS:  RT FEMORAL NECK FRACTURE  POST-OPERATIVE DIAGNOSIS:  right femoral neck fracture  PROCEDURE:  Procedure(s): CEMENTED MONOPOLAR HIP (Right)  SURGEON:  Surgeon(s) and Role:    * Eldred Manges, MD - Primary  PHYSICIAN ASSISTANT:   ASSISTANTS: none   ANESTHESIA:   general  EBL:  Total I/O In: 513.7 [I.V.:513.7] Out: 150 [Urine:150]  BLOOD ADMINISTERED:none  DRAINS: none   LOCAL MEDICATIONS USED:  NONE  SPECIMEN:  No Specimen  DISPOSITION OF SPECIMEN:  N/A  COUNTS:  YES  TOURNIQUET:  * No tourniquets in log *  DICTATION: .Other Dictation: Dictation Number [id number]  PLAN OF CARE: still inpatient  PATIENT DISPOSITION:  PACU - hemodynamically stable.   Delay start of Pharmacological VTE agent (>24hrs) due to surgical blood loss or risk of bleeding: yes

## 2012-08-03 NOTE — Transfer of Care (Signed)
Immediate Anesthesia Transfer of Care Note  Patient: [name]  Procedure(s) Performed: Procedure(s): CEMENTED MONOPOLAR HIP (Right)  Patient Location: PACU  Anesthesia Type:General  Level of Consciousness: awake, patient cooperative and responds to stimulation  Airway & Oxygen Therapy: Patient Spontanous Breathing and Patient connected to face mask oxygen  Post-op Assessment: Report given to PACU RN, Post -op Vital signs reviewed and stable and Patient moving all extremities X 4  Post vital signs: Reviewed and stable  Complications: No apparent anesthesia complications

## 2012-08-04 DIAGNOSIS — I959 Hypotension, unspecified: Secondary | ICD-10-CM

## 2012-08-04 DIAGNOSIS — I509 Heart failure, unspecified: Secondary | ICD-10-CM

## 2012-08-04 LAB — CBC
HCT: 30.2 % — ABNORMAL LOW (ref 36.0–46.0)
Hemoglobin: 9.6 g/dL — ABNORMAL LOW (ref 12.0–15.0)
MCV: 92.9 fL (ref 78.0–100.0)
RBC: 3.25 MIL/uL — ABNORMAL LOW (ref 3.87–5.11)
WBC: 7.3 10*3/uL (ref 4.0–10.5)

## 2012-08-04 LAB — URINE CULTURE: Colony Count: >=100000

## 2012-08-04 LAB — BASIC METABOLIC PANEL
BUN: 17 mg/dL (ref 6–23)
CO2: 29 mEq/L (ref 19–32)
Chloride: 99 mEq/L (ref 96–112)
GFR calc non Af Amer: 52 mL/min — ABNORMAL LOW (ref >90)
Glucose, Bld: 163 mg/dL — ABNORMAL HIGH (ref 70–99)
Potassium: 4.1 mEq/L (ref 3.5–5.1)
Sodium: 135 mEq/L (ref 135–145)

## 2012-08-04 LAB — VITAMIN B12: Vitamin B-12: 1062 pg/mL — ABNORMAL HIGH (ref 211–911)

## 2012-08-04 MED ORDER — SODIUM CHLORIDE 0.9 % IV SOLN
INTRAVENOUS | Status: DC
  Administered 2012-08-04 (×2): via INTRAVENOUS

## 2012-08-04 NOTE — Evaluation (Signed)
Physical Therapy Evaluation Patient Details Name: [name] MRN: [id number] DOB: 08-28-1924 Today's Date: 08/04/2012 Time: 4098-1191 PT Time Calculation (min): 28 min  PT Assessment / Plan / Recommendation Clinical Impression  Pt presents with R femoral neck fracture s/p bipolar hip arthroplasty POD 1 with decreased strength, ROM and mobility.  Tolerated OOB to chair with +2 assist for safety.  Pt will benefit from skilled PT in acute venue to address deficits.  PT recommends SNF for follow up therapy at D/C to maximize pts safety and function.     PT Assessment  Patient needs continued PT services    Follow Up Recommendations  SNF;Supervision/Assistance - 24 hour    Does the patient have the potential to tolerate intense rehabilitation      Barriers to Discharge Decreased caregiver support      Equipment Recommendations  None recommended by PT    Recommendations for Other Services     Frequency Min 3X/week    Precautions / Restrictions Precautions Precautions: Posterior Hip;Fall Precaution Comments: Educated on THP and provided handout to hang in room.  Restrictions Weight Bearing Restrictions: No Other Position/Activity Restrictions: WBAT   Pertinent Vitals/Pain Pt with good pain control during session.  Would only state pain with WB and getting LLE out of bed.       Mobility  Bed Mobility Bed Mobility: Supine to Sit Supine to Sit: 1: +2 Total assist;HOB elevated;With rails Supine to Sit: Patient Percentage: 50% Details for Bed Mobility Assistance: Pt able to assist with scooting hips to EOB and also with LLE out of bed.  She only requires min assist for trunk to attain seated position.  Cues for hand placement and technique to self assist.  Transfers Transfers: Sit to Stand;Stand to Sit;Stand Pivot Transfers Sit to Stand: 1: +2 Total assist;From elevated surface;With upper extremity assist;From bed Sit to Stand: Patient Percentage: 60% Stand to Sit: 1: +2  Total assist;With upper extremity assist;With armrests;To chair/3-in-1 Stand to Sit: Patient Percentage: 60% Stand Pivot Transfers: 1: +2 Total assist Stand Pivot Transfers: Patient Percentage: 60% Details for Transfer Assistance: Assist to rise, steady and ensure controlled descent with cues for hand placement and LE management to maintain THP.  Pt able to take some small steps from bed to chair with cues for sequencing/technique with RW and to maintain upright posture throughout.  Ambulation/Gait Ambulation/Gait Assistance: Not tested (comment) Assistive device: Rolling walker Stairs: No Wheelchair Mobility Wheelchair Mobility: No    Exercises     PT Diagnosis: Difficulty walking;Generalized weakness;Acute pain  PT Problem List: Decreased strength;Decreased range of motion;Decreased activity tolerance;Decreased balance;Decreased mobility;Decreased coordination;Decreased knowledge of use of DME;Decreased safety awareness;Decreased knowledge of precautions;Pain PT Treatment Interventions: DME instruction;Gait training;Functional mobility training;Therapeutic activities;Therapeutic exercise;Balance training;Patient/family education   PT Goals Acute Rehab PT Goals PT Goal Formulation: With patient Time For Goal Achievement: 08/11/12 Potential to Achieve Goals: Good Pt will go Supine/Side to Sit: with min assist PT Goal: Supine/Side to Sit - Progress: Goal set today Pt will go Sit to Supine/Side: with min assist PT Goal: Sit to Supine/Side - Progress: Goal set today Pt will go Sit to Stand: with min assist PT Goal: Sit to Stand - Progress: Goal set today Pt will go Stand to Sit: with min assist PT Goal: Stand to Sit - Progress: Goal set today Pt will Transfer Bed to Chair/Chair to Bed: with min assist PT Transfer Goal: Bed to Chair/Chair to Bed - Progress: Goal set today Pt will Ambulate: 16 - 50 feet;with  mod assist;with least restrictive assistive device PT Goal: Ambulate - Progress:  Goal set today Pt will Perform Home Exercise Program: with supervision, verbal cues required/provided PT Goal: Perform Home Exercise Program - Progress: Goal set today  Visit Information  Last PT Received On: 08/04/12 Assistance Needed: +2    Subjective Data  Subjective: I'm feeling a little better than I was Patient Stated Goal: to not go to rehab unless I have to   Prior Functioning  Home Living Lives With: Son Available Help at Discharge: Family;Available PRN/intermittently (son works during the day) Type of Home: House Home Access: Stairs to enter Entrance Stairs-Rails: Right Home Layout: One level Bathroom Shower/Tub: Health visitor: Standard Home Adaptive Equipment: Straight cane;Shower chair with back Prior Function Level of Independence: Independent with assistive device(s) Comments: Pt was able to perform all ADLs' on her own with use of SPC.  Communication Communication: No difficulties    Cognition  Cognition Arousal/Alertness: Awake/alert Behavior During Therapy: WFL for tasks assessed/performed Overall Cognitive Status: Within Functional Limits for tasks assessed    Extremity/Trunk Assessment Right Lower Extremity Assessment RLE ROM/Strength/Tone: Deficits RLE ROM/Strength/Tone Deficits: ankle motions WFL, able to assist with SLR with therapist assist.  Also requires assist for hip abd to get RLE out of bed.  RLE Sensation: WFL - Light Touch Left Lower Extremity Assessment LLE ROM/Strength/Tone: WFL for tasks assessed LLE Sensation: WFL - Light Touch Trunk Assessment Trunk Assessment: Kyphotic   Balance    End of Session PT - End of Session Equipment Utilized During Treatment: Gait belt Activity Tolerance: Patient limited by pain;Patient tolerated treatment well Patient left: in chair;with call bell/phone within reach Nurse Communication: Mobility status  GP     Vista Deck 08/04/2012, 1:46 PM

## 2012-08-04 NOTE — Op Note (Signed)
NAMEVICTOR, [name]               ACCOUNT NO.:  [id number]  MEDICAL RECORD NO.:  [id number]  LOCATION:  1235                         FACILITY:  [facility]  PHYSICIAN:  Lorrane Mccay C. Ophelia Charter, M.D.    DATE OF BIRTH:  October 15, 1924  DATE OF PROCEDURE:  08/03/2012 DATE OF DISCHARGE:                              OPERATIVE REPORT   PREOPERATIVE DIAGNOSIS:  Displaced right femoral neck fracture.  POSTOPERATIVE DIAGNOSIS:  Displaced right femoral neck fracture.  PROCEDURE:  Right cemented monopolar hemiarthroplasty, #4 Summit basic stem, #3 distal cement restrictor, 11 mm centralizer +0 neck, 46 mm ball.  ANESTHESIA:  General.  COMPLICATIONS:  None.  PROCEDURE IN DETAIL:  After induction of general anesthesia, the patient was placed in lateral position.  I already had a Foley catheter placed, standard prepping and draping was performed.  The patient had an axillary roll.  Preoperative Ancef was given prophylactically.  Usual split sheets, stockinette, Coban, Betadine, Steri-Drape was applied x2 to seal the skin and groin.  Posterior approach was planned and skin had already been marked before the Steri-Drape was applied.  Time-out procedure was completed.  Posterior approach was made and gluteus maximus was split in line with its fibers.  Charnley retractor placed.  Piriformis was tagged and then cut.  Posterior capsule was opened.  Neck was cut 1 fingerbreadth above the lesser trochanter.  Head was removed with a corkscrew sized at 46 with good suction fit.  Canal preparation was performed lateralizing cookie cutter, reaming, and broaching up to #4.  Distal cement restrictor was placed #3, 11 mm centralizer placed on a #4 stem, vacuum mixing of the cement, impaction of the cement with a glue gun.  Once it was starting to set, the stem was pounded down, and held until the cement was hard.  Trial sizers already showed +0, neck length was excellent stability.  The permanent monopolar head was popped  on. Sciatic nerve protected with the finger tip.  Hip was reduced. Irrigation and repair of the piriformis gluteus medius.  Tensor fascia was closed with #1 Ethibond and #1 Vicryl and the gluteus maximus fascia with 2-0 on the subcutaneous tissue, skin staple closure, postop dressing, and transferred to the recovery room in stable condition with the knee immobilizer.     Nichoals Heyde C. Ophelia Charter, M.D.     MCY/MEDQ  D:  08/03/2012  T:  08/04/2012  Job:  [id number]

## 2012-08-04 NOTE — Progress Notes (Signed)
[facility] PROGRESS NOTE  Assessment/Plan: *Fracture of femoral neck, right, closed: - Ortho consult for surgery 5.03.2014. - PT consult after surgery. - Episode of hypotensiont. On NEO during surgery had 500 cc bolus. Hypotension and tachycardia resolved. transfer to SDU.  - ? Due to decrease intravascular vol and anesthetics. Mild bump in Cr. I agree with IV fluids. - d/c lasix and elavil can cause decrease in pre-load. - Avoid IV Narcotics as can also cause hypotension. - Transfer to telemetry.  Anemia, unspecified: - Hbg 9.6 monitor. - Cbc in am, RDW high, MCV in 90's. - RBC folate and B12  CHF (congestive heart failure): - No JVD, lung are clear.  Immune thrombocytopenia: - PLT's stable. - monitor.   Diabetes mellitus: - well control.  HTN (hypertension): - stable.    Code Status: Full Code.  Family Communication: none at bedside.  Disposition Plan: SNF     Consultants:  ORtho  Procedures:  ORIF  Antibiotics:  None  HPI/Subjective: She relates she feels better. No pain.  Objective: Filed Vitals:   08/04/12 0300 08/04/12 0400 08/04/12 0500 08/04/12 0600  BP: 100/47 98/53 86/45  97/43  Pulse: 95 94 87 85  Temp:  99.2 F (37.3 C)    TempSrc:  Axillary    Resp: 10 16 7 14   Height:      Weight:      SpO2: 96% 97% 97% 97%    Intake/Output Summary (Last 24 hours) at 08/04/12 0729 Last data filed at 08/04/12 0600  Gross per 24 hour  Intake 2673.67 ml  Output   1975 ml  Net 698.67 ml   Filed Weights   08/02/12 2211 08/03/12 2300  Weight: 67.132 kg (148 lb) 75.5 kg (166 lb 7.2 oz)    Exam:  General: Alert, awake, oriented x3, in no acute distress.  HEENT: No bruits, no goiter. No JVD, dry mouth. Heart: Regular rate and rhythm, without murmurs, rubs, gallops.  Lungs: Good air movement, clear to auscultation Abdomen: Soft, nontender, nondistended, positive bowel sounds.  Neuro: Grossly intact, nonfocal.   Data Reviewed: Basic  Metabolic Panel:  Recent Labs Lab 08/02/12 1604 08/03/12 0415 08/04/12 0330  NA 136 136 135  K 4.7 4.1 4.1  CL 100 99 99  CO2 29 28 29   GLUCOSE 164* 139* 163*  BUN 18 15 17   CREATININE 0.84 0.78 0.96  CALCIUM 8.9 8.8 8.2*   Liver Function Tests:  Recent Labs Lab 08/02/12 1604  AST 25  ALT 26  ALKPHOS 90  BILITOT 0.4  PROT 5.9*  ALBUMIN 2.9*   No results found for this basename: LIPASE, AMYLASE,  in the last 168 hours No results found for this basename: AMMONIA,  in the last 168 hours CBC:  Recent Labs Lab 08/02/12 1604 08/03/12 0415 08/04/12 0330  WBC 6.0 7.4 7.3  HGB 9.4* 9.7* 9.6*  HCT 29.4* 31.1* 30.2*  MCV 94.2 95.1 92.9  PLT 143* 130* PLATELET CLUMPS NOTED ON SMEAR, COUNT APPEARS ADEQUATE   Cardiac Enzymes: No results found for this basename: CKTOTAL, CKMB, CKMBINDEX, TROPONINI,  in the last 168 hours BNP (last 3 results)  Recent Labs  03/20/12 1008  PROBNP 1166.0*   CBG:  Recent Labs Lab 08/03/12 0700 08/03/12 1208 08/03/12 1710 08/03/12 2049 08/03/12 2329  GLUCAP 118* 161* 115* 136* 175*    Recent Results (from the past 240 hour(s))  SURGICAL PCR SCREEN     Status: None   Collection Time    08/03/12  7:03 AM  Result Value Range Status   MRSA, PCR NEGATIVE  NEGATIVE Final   Staphylococcus aureus NEGATIVE  NEGATIVE Final   Comment:            The Xpert SA Assay (FDA     approved for NASAL specimens     in patients over 38 years of age),     is one component of     a comprehensive surveillance     program.  Test performance has     been validated by [employer] for patients greater     than or equal to 33 year old.     It is not intended     to diagnose infection nor to     guide or monitor treatment.     Studies: Dg Chest 1 View  08/02/2012   *RADIOLOGY REPORT*  Clinical Data: Preoperative evaluation prior to repair of right hip fracture.  CHEST - 1 VIEW  Comparison: Chest x-ray 05/17/2012.  Findings: Chronic  scarring at the left base is unchanged. Lung volumes are normal.  No consolidative airspace disease.  No pleural effusions.  No pneumothorax.  No pulmonary nodule or mass noted. Pulmonary vasculature and the cardiomediastinal silhouette are within normal limits.  Atherosclerosis of the thoracic aorta. Orthopedic fixation hardware in the lower cervical spine incompletely visualized.  IMPRESSION: 1.  No radiographic evidence of acute cardiopulmonary disease. 2.  Atherosclerosis. 3.  Chronic scarring at the left base is unchanged.   Original Report Authenticated By: Trudie Reed, M.D.   Dg Shoulder Right  08/02/2012   *RADIOLOGY REPORT*  Clinical Data: History of fall complaining of right shoulder pain.  RIGHT SHOULDER - 2+ VIEW  Comparison: No priors.  Findings: Three views of the right shoulder demonstrate no acute displaced fracture, subluxation, dislocation, joint or soft tissue abnormality.  IMPRESSION: 1.  No acute radiographic abnormality of the right shoulder.   Original Report Authenticated By: Trudie Reed, M.D.   Dg Hip Complete Right  08/02/2012   *RADIOLOGY REPORT*  Clinical Data: History of fall complaining of right-sided hip pain.  RIGHT HIP - COMPLETE 2+ VIEW  Comparison: No priors.  Findings: AP view of the pelvis and AP and lateral views of the right hip demonstrate an acute mildly displaced transcervical right femoral neck fracture.  There appears to be mild superolateral migration of the distal fracture fragment, and although the lateral view is suboptimal, there is approximately 30 degrees of dorsal angulation.  The bony pelvis itself appears intact.  Postoperative changes of left total hip arthroplasty are noted.  The distal end of a right-sided double-J ureteral stent is also noted.  IMPRESSION: 1.  Acute mildly displaced and angulated transcervical right femoral neck fracture, as above.   Original Report Authenticated By: Trudie Reed, M.D.   Dg Pelvis Portable  08/03/2012    *RADIOLOGY REPORT*  Clinical Data: Postop right hip replacement.  PORTABLE PELVIS  Comparison: 05/14/2012  Findings: Remote changes of left hip replacement.  Interval changes of right hip replacement.  No acute bony abnormality.  No hardware or bony complicating feature.  Normal AP alignment.  Soft tissue gas noted.  IMPRESSION: Interval right hip replacement.  No visible complicating feature.   Original Report Authenticated By: Charlett Nose, M.D.    Scheduled Meds: . acetaminophen  1,000 mg Intravenous Q6H  . amitriptyline  50 mg Oral QHS  . antiseptic oral rinse  15 mL Mouth Rinse q12n4p  . aspirin EC  325 mg Oral  Q breakfast  . chlorhexidine  15 mL Mouth Rinse BID  . docusate sodium  100 mg Oral BID  . enoxaparin (LOVENOX) injection  30 mg Subcutaneous Q24H  . feeding supplement  237 mL Oral BID PC  . furosemide  20 mg Oral Daily  . gabapentin  300 mg Oral TID  . insulin aspart  0-9 Units Subcutaneous TID WC  . insulin detemir  10 Units Subcutaneous QHS  . multivitamin with minerals  1 tablet Oral Daily  . predniSONE  3 mg Oral Q breakfast   Continuous Infusions: . sodium chloride Stopped (08/03/12 1931)  . sodium chloride 100 mL/hr at 08/03/12 2255     Marinda Elk  [facility] Pager [phone]. If 8PM-8AM, please contact night-coverage at [email or web address], password [facility] 08/04/2012, 7:29 AM  LOS: 2 days

## 2012-08-04 NOTE — Progress Notes (Signed)
Subjective: Pt stable - awake and alert this am   Objective: Vital signs in last 24 hours: Temp:  [97.8 F (36.6 C)-101 F (38.3 C)] 99.2 F (37.3 C) (05/31 0400) Pulse Rate:  [85-120] 85 (05/31 0600) Resp:  [6-18] 14 (05/31 0600) BP: (79-125)/(42-75) 97/43 mmHg (05/31 0600) SpO2:  [84 %-98 %] 97 % (05/31 0600) Weight:  [75.5 kg (166 lb 7.2 oz)] 75.5 kg (166 lb 7.2 oz) (05/30 2300)  Intake/Output from previous day: 05/30 0701 - 05/31 0700 In: 2673.7 [P.O.:120; I.V.:1853.7; IV Piggyback:700] Out: 1975 [Urine:1775; Blood:200] Intake/Output this shift:    Exam:  Neurovascular intact Sensation intact distally Intact pulses distally  Labs:  Recent Labs  08/02/12 1604 08/03/12 0415 08/04/12 0330  HGB 9.4* 9.7* 9.6*    Recent Labs  08/03/12 0415 08/04/12 0330  WBC 7.4 7.3  RBC 3.27* 3.25*  HCT 31.1* 30.2*  PLT 130* PLATELET CLUMPS NOTED ON SMEAR, COUNT APPEARS ADEQUATE    Recent Labs  08/03/12 0415 08/04/12 0330  NA 136 135  K 4.1 4.1  CL 99 99  CO2 28 29  BUN 15 17  CREATININE 0.78 0.96  GLUCOSE 139* 163*  CALCIUM 8.8 8.2*   No results found for this basename: LABPT, INR,  in the last 72 hours  Assessment/Plan: Labs ok - vs improving - agree with transfer today   DEAN,GREGORY SCOTT 08/04/2012, 9:46 AM

## 2012-08-04 NOTE — Progress Notes (Signed)
Patient resting in bed BP 85/52 and MAP 60. Patient asymptomatic at this time denying any dizziness.. Dr. David Stall  notified no new orders received. Will continue to monitor.

## 2012-08-04 NOTE — Evaluation (Signed)
Occupational Therapy Evaluation Patient Details Name: [name] MRN: [id number] DOB: 1924-09-21 Today's Date: 08/04/2012 Time: 7829-5621 OT Time Calculation (min): 27 min  OT Assessment / Plan / Recommendation Clinical Impression  This 79 yo female s/p fall with resultant right hip fracture and now s/p RTHA (posterior approach presents to acute OT with problems below. Will benefit from acute OT with follow up at SNF.    OT Assessment  Patient needs continued OT Services    Follow Up Recommendations  SNF    Barriers to Discharge Decreased caregiver support    Equipment Recommendations   (TBD at next venue)       Frequency  Min 2X/week    Precautions / Restrictions Precautions Precautions: Posterior Hip;Fall Precaution Comments: Educated on THP and provided handout to hang in room.  Restrictions Weight Bearing Restrictions: No Other Position/Activity Restrictions: WBAT   Pertinent Vitals/Pain Right hip pain with movement, but not rated    ADL       OT Diagnosis: Generalized weakness;Acute pain  OT Problem List: Decreased strength;Decreased range of motion;Impaired balance (sitting and/or standing);Decreased activity tolerance;Impaired vision/perception;Pain OT Treatment Interventions: Self-care/ADL training;Balance training;Patient/family education;DME and/or AE instruction   OT Goals Acute Rehab OT Goals OT Goal Formulation: With patient Time For Goal Achievement: 08/11/12 Potential to Achieve Goals: Good ADL Goals Pt Will Perform Grooming: with min assist;Standing at sink;Unsupported (1 task) ADL Goal: Grooming - Progress: Goal set today Pt Will Perform Lower Body Dressing: with min assist;Sit to stand from chair;Sit to stand from bed;Unsupported;with adaptive equipment ADL Goal: Lower Body Dressing - Progress: Goal set today Pt Will Transfer to Toilet: with min assist;Ambulation;with DME;Comfort height toilet;3-in-1;Grab bars;Maintaining hip precautions ADL  Goal: Toilet Transfer - Progress: Goal set today Miscellaneous OT Goals Miscellaneous OT Goal #1: Pt will be Min A to get up to EOB for BADLs OT Goal: Miscellaneous Goal #1 - Progress: Goal set today  Visit Information  Last OT Received On: 08/04/12 Assistance Needed: +2 PT/OT Co-Evaluation/Treatment: Yes       Prior Functioning     Home Living Lives With: Son Available Help at Discharge: Family;Available PRN/intermittently (son works during the day) Type of Home: House Home Access: Stairs to enter Entrance Stairs-Rails: Right Home Layout: One level Bathroom Shower/Tub: Health visitor: Standard Home Adaptive Equipment: Straight cane;Shower chair with back Prior Function Level of Independence: Independent with assistive device(s) Able to Take Stairs?: Yes Vocation: Retired Comments: Pt was able to perform all ADLs' on her own with use of SPC.  Communication Communication: No difficulties Dominant Hand: Right         Vision/Perception Vision - History Visual History: Macular degeneration Patient Visual Report: No change from baseline   Cognition  Cognition Arousal/Alertness: Awake/alert Behavior During Therapy: WFL for tasks assessed/performed Overall Cognitive Status: Within Functional Limits for tasks assessed    Extremity/Trunk Assessment Right Upper Extremity Assessment RUE ROM/Strength/Tone: Within functional levels Left Upper Extremity Assessment LUE ROM/Strength/Tone: Within functional levels Right Lower Extremity Assessment RLE ROM/Strength/Tone: Deficits RLE ROM/Strength/Tone Deficits: ankle motions WFL, able to assist with SLR with therapist assist.  Also requires assist for hip abd to get RLE out of bed.  RLE Sensation: WFL - Light Touch Left Lower Extremity Assessment LLE ROM/Strength/Tone: WFL for tasks assessed LLE Sensation: WFL - Light Touch Trunk Assessment Trunk Assessment: Kyphotic     Mobility Bed Mobility Bed  Mobility: Supine to Sit Supine to Sit: 1: +2 Total assist;HOB elevated;With rails Supine to Sit: Patient Percentage: 50%  Details for Bed Mobility Assistance: Pt able to assist with scooting hips to EOB and also with LLE out of bed.  She only requires min assist for trunk to attain seated position.  Cues for hand placement and technique to self assist.  Transfers Sit to Stand: 1: +2 Total assist;From elevated surface;With upper extremity assist;From bed Sit to Stand: Patient Percentage: 60% Stand to Sit: 1: +2 Total assist;With upper extremity assist;With armrests;To chair/3-in-1 Stand to Sit: Patient Percentage: 60% Details for Transfer Assistance: Assist to rise, steady and ensure controlled descent with cues for hand placement and LE management to maintain THP.  Pt able to take some small steps from bed to chair with cues for sequencing/technique with RW and to maintain upright posture throughout.            End of Session OT - End of Session Equipment Utilized During Treatment: Gait belt Activity Tolerance: Patient tolerated treatment well Patient left: in chair;with call bell/phone within reach Nurse Communication: Mobility status (+2 stand turn)       Evette Georges [phone] 08/04/2012, 3:00 PM

## 2012-08-05 DIAGNOSIS — I5032 Chronic diastolic (congestive) heart failure: Secondary | ICD-10-CM

## 2012-08-05 DIAGNOSIS — N179 Acute kidney failure, unspecified: Secondary | ICD-10-CM

## 2012-08-05 LAB — GLUCOSE, CAPILLARY
Glucose-Capillary: 178 mg/dL — ABNORMAL HIGH (ref 70–99)
Glucose-Capillary: 172 mg/dL — ABNORMAL HIGH (ref 70–99)

## 2012-08-05 LAB — CBC
Hemoglobin: 10.4 g/dL — ABNORMAL LOW (ref 12.0–15.0)
MCH: 30.4 pg (ref 26.0–34.0)
MCV: 94.2 fL (ref 78.0–100.0)
RBC: 3.42 MIL/uL — ABNORMAL LOW (ref 3.87–5.11)

## 2012-08-05 LAB — BASIC METABOLIC PANEL
BUN: 18 mg/dL (ref 6–23)
CO2: 22 mEq/L (ref 19–32)
Calcium: 8.6 mg/dL (ref 8.4–10.5)
Creatinine, Ser: 1.05 mg/dL (ref 0.50–1.10)
Glucose, Bld: 233 mg/dL — ABNORMAL HIGH (ref 70–99)

## 2012-08-05 MED ORDER — SODIUM CHLORIDE 0.45 % IV SOLN
INTRAVENOUS | Status: AC
  Administered 2012-08-05: 09:00:00 via INTRAVENOUS

## 2012-08-05 MED ORDER — HYDROCODONE-ACETAMINOPHEN 5-325 MG PO TABS: 1.0000 | ORAL_TABLET | Freq: Four times a day (QID) | ORAL | Status: DC | PRN

## 2012-08-05 MED ORDER — DEXTROSE 5 % IV SOLN: INTRAVENOUS | Status: DC

## 2012-08-05 MED ORDER — SODIUM POLYSTYRENE SULFONATE 15 GM/60ML PO SUSP
15.0000 g | Freq: Once | ORAL | Status: AC
  Administered 2012-08-05: 15 g via ORAL
  Filled 2012-08-05: qty 60

## 2012-08-05 MED ORDER — INSULIN DETEMIR 100 UNIT/ML ~~LOC~~ SOLN
30.0000 [IU] | Freq: Every day | SUBCUTANEOUS | Status: DC
  Administered 2012-08-05: 30 [IU] via SUBCUTANEOUS
  Filled 2012-08-05 (×2): qty 0.3

## 2012-08-05 MED ORDER — SODIUM CHLORIDE 0.9 % IV BOLUS (SEPSIS)
250.0000 mL | Freq: Once | INTRAVENOUS | Status: AC
  Administered 2012-08-05: 250 mL via INTRAVENOUS

## 2012-08-05 MED ORDER — SODIUM POLYSTYRENE SULFONATE 15 GM/60ML PO SUSP
15.0000 g | Freq: Once | ORAL | Status: DC
  Filled 2012-08-05: qty 60

## 2012-08-05 MED ORDER — INSULIN DETEMIR 100 UNIT/ML ~~LOC~~ SOLN
20.0000 [IU] | Freq: Once | SUBCUTANEOUS | Status: AC
  Administered 2012-08-05: 20 [IU] via SUBCUTANEOUS
  Filled 2012-08-05: qty 0.2

## 2012-08-05 NOTE — Care Management Note (Addendum)
    Page 1 of 1   08/06/2012     12:48:25 PM   CARE MANAGEMENT NOTE 08/06/2012  Patient:  [name], [name]   Account Number:  [id number]  Date Initiated:  08/05/2012  Documentation initiated by:  Lanier Clam  Subjective/Objective Assessment:   ADMITTEDW/R FEMORAL NECK FX.     Action/Plan:   FROM HOME.   Anticipated DC Date:  08/06/2012   Anticipated DC Plan:  SKILLED NURSING FACILITY      DC Planning Services  CM consult      Choice offered to / List presented to:             Status of service:  Completed, signed off Medicare Important Message given?   (If response is "NO", the following Medicare IM given date fields will be blank) Date Medicare IM given:   Date Additional Medicare IM given:    Discharge Disposition:  SKILLED NURSING FACILITY  Per UR Regulation:    If discussed at Long Length of Stay Meetings, dates discussed:    Comments:  08/05/12 Jayleen Scaglione RN,BSN NCM WEEKEND [phone] PT/OT-SNF.POD#2 R HEMIARTHROPLASTY.

## 2012-08-05 NOTE — Progress Notes (Signed)
06:21-Notified T. Callahan,NP for TRH1-of low BP con't decreased U/O; diaphoretic; CBG & con't pain, with po Vicodin & Tramadol-avoiding the IV prn's due to BP so low-Orders received at 06:28am-Will follow & con't to monitor the pt

## 2012-08-05 NOTE — Progress Notes (Signed)
Clinical Social Work Department BRIEF PSYCHOSOCIAL ASSESSMENT 08/05/2012  Patient:  [name], [name]     Account Number:  [id number]     Admit date:  08/02/2012  Clinical Social Worker:  Doroteo Glassman  Date/Time:  08/05/2012 04:03 PM  Referred by:  Physician  Date Referred:  08/05/2012 Referred for  SNF Placement   Other Referral:   Interview type:  Patient Other interview type:   Son at bedside    PSYCHOSOCIAL DATA Living Status:  FAMILY Admitted from facility:   Level of care:   Primary support name:  Alinda Money Primary support relationship to patient:  CHILD, ADULT Degree of support available:   strong    CURRENT CONCERNS Current Concerns  Post-Acute Placement   Other Concerns:    SOCIAL WORK ASSESSMENT / PLAN Met with Pt and son to discuss d/c plans.    Pt and son would like for Pt to return to [facility], as she was there recently.  Pt has also been to [facility].    Son stated that Pt owes $50 or $100 to [employer].  He intends to go tomorrow and make arrangement to have this bill paid on Wednesday, as this is when his mom gets paid.    CSW to alert Weekday CSW of this matter.  Weekday CSW to f/u.    CSW thanked Pt and her son for their time.   Assessment/plan status:  Psychosocial Support/Ongoing Assessment of Needs Other assessment/ plan:   Information/referral to community resources:   n/a-Want [employer]    PATIENT'S/FAMILY'S RESPONSE TO PLAN OF CARE: Pt and son thanked CSW for time and assistance.   Providence Crosby, LCSWA Clinical Social Work [phone]

## 2012-08-05 NOTE — Progress Notes (Signed)
Clinical Social Work Department CLINICAL SOCIAL WORK PLACEMENT NOTE 08/05/2012  Patient:  SHANAN, FITZPATRICK  Account Number:  [id number] Admit date:  08/02/2012  Clinical Social Worker:  Doroteo Glassman  Date/time:  08/05/2012 04:06 PM  Clinical Social Work is seeking post-discharge placement for this patient at the following level of care:   SKILLED NURSING   (*CSW will update this form in Epic as items are completed)   N/a--wanting Landscape architect provided with [facility] Department of Clinical Social Work's list of facilities offering this level of care within the geographic area requested by the patient (or if unable, by the patient's family).  08/05/2012  Patient/family informed of their freedom to choose among providers that offer the needed level of care, that participate in Medicare, Medicaid or managed care program needed by the patient, have an available bed and are willing to accept the patient.  N/a--wanting [employer]   Patient/family informed of [facility]' ownership interest in [facility], as well as of the fact that they are under no obligation to receive care at this facility.  PASARR submitted to EDS on existing PASARR number received from EDS on   FL2 transmitted to all facilities in geographic area requested by pt/family on  08/05/2012 FL2 transmitted to all facilities within larger geographic area on   Patient informed that his/her managed care company has contracts with or will negotiate with  certain facilities, including the following:     Patient/family informed of bed offers received:   Patient chooses bed at  Physician recommends and patient chooses bed at    Patient to be transferred to  on   Patient to be transferred to facility by   The following physician request were entered in Epic:   Additional Comments:  Providence Crosby, Theresia Majors Clinical Social Work [phone]

## 2012-08-05 NOTE — Progress Notes (Signed)
Subjective: Pt stable - feels better today   Objective: Vital signs in last 24 hours: Temp:  [97.3 F (36.3 C)-98.7 F (37.1 C)] 97.3 F (36.3 C) (06/01 0545) Pulse Rate:  [88-104] 88 (06/01 0904) Resp:  [18-20] 18 (06/01 0545) BP: (83-103)/(48-66) 103/62 mmHg (06/01 0904) SpO2:  [94 %-100 %] 94 % (06/01 0545)  Intake/Output from previous day: 05/31 0701 - 06/01 0700 In: 1845 [P.O.:720; I.V.:1125] Out: 1602 [Urine:1601; Stool:1] Intake/Output this shift: Total I/O In: 240 [P.O.:240] Out: -   Exam:  Neurovascular intact Sensation intact distally Intact pulses distally  Labs:  Recent Labs  08/02/12 1604 08/03/12 0415 08/04/12 0330 08/05/12 0525  HGB 9.4* 9.7* 9.6* 10.4*    Recent Labs  08/04/12 0330 08/05/12 0525  WBC 7.3 9.0  RBC 3.25* 3.42*  HCT 30.2* 32.2*  PLT PLATELET CLUMPS NOTED ON SMEAR, COUNT APPEARS ADEQUATE 165    Recent Labs  08/04/12 0330 08/05/12 0525  NA 135 132*  K 4.1 5.2*  CL 99 97  CO2 29 22  BUN 17 18  CREATININE 0.96 1.05  GLUCOSE 163* 233*  CALCIUM 8.2* 8.6   No results found for this basename: LABPT, INR,  in the last 72 hours  Assessment/Plan: Mobilize with PT will need SNF   DEAN,GREGORY SCOTT 08/05/2012, 11:24 AM

## 2012-08-05 NOTE — Discharge Summary (Addendum)
Physician Discharge Summary  [name]:[id number] DOB: 1924/04/16 DOA: 08/02/2012  PCP: Shelley Relic, MD  Admit date: 08/02/2012 Discharge date: 08/06/2012  Time spent: 35 minutes  Recommendations for Outpatient Follow-up:  1. Follow up [name]  Discharge Diagnoses:  Principal Problem:   Fracture of femoral neck, right, closed traumatic fracture not pathological in nature. Active Problems:   Diabetes mellitus   HTN (hypertension)   Immune thrombocytopenia   Chronic diastolic congestive heart failure   Anemia, unspecified   Hypotension, unspecified   Discharge Condition: stable  Diet recommendation: heart healthy diet  Filed Weights   08/02/12 2211 08/03/12 2300  Weight: 67.132 kg (148 lb) 75.5 kg (166 lb 7.2 oz)    History of present illness:  79 y.o. female with PMH significant for HTN, Diastolic Heart Failure grade 1, last EF at 65 to 70 % by ECHO 2012, Diabetes, Renal oncocytoma, right renal leak, sp right double-J stent 2014 who presents after a mechanical fall and was found to have a right femoral neck fracture. Patient relates she was going down the stair when she missed a step and fell. She hit right side. She presents complaining of right side hip pain and shoulder pain. She denies loss of consciousness. She denies chest pain or dyspnea at rest or on exertion. She is active and able to ambulate. She denies nausea, vomiting, diarrhea.    Hospital Course:  Fracture of femoral neck, right, closed:  - Ortho consult for surgery 5.03.2014.  - PT consult SNF.  - Episode of hypotensiont. On NEO during surgery had 500 cc bolus. Hypotension and tachycardia resolved. transfer to SDU.  - ? Due to decrease intravascular vol and anesthetics. Mild bump in Cr. I agree with IV fluids.  - D/c lasix and elavil can cause decrease in pre-load. Will be resume as an outpatient.  - Cont oral narcotics.  Anemia, unspecified:  - Hbg 9.6 monitor.  - Cbc in am, RDW high, MCV in  90's.  - RBC folate follow up as an outpatient and B12 1000's  CHF (congestive heart failure):  - No JVD, lung are clear.   Immune thrombocytopenia:  - PLT's stable.   Diabetes mellitus:  - well control.   HTN (hypertension):  - stable.    Procedures:  CXR  Pelvis x-ray  Hip x-ray complete  Shoulder x-ray  Consultations:  Ortho [name]  Discharge Exam: Filed Vitals:   08/04/12 1527 08/04/12 2117 08/05/12 0545 08/05/12 0904  BP:  97/52 83/53 103/62  Pulse:   102 88  Temp:  98.7 F (37.1 C) 97.3 F (36.3 C)   TempSrc:  Oral Oral   Resp: 18 18 18    Height:      Weight:      SpO2:  97% 94%     General: A&O x3 Cardiovascular: RRR Respiratory: Good air movement CTA B/L  Discharge Instructions  Discharge Orders   Future Appointments Provider Department Dept Phone   10/22/2012 8:30 AM [facility] [phone]   10/24/2012 3:15 PM Shelley Relic, MD [facility] [phone]   Future Orders Complete By Expires     Diet - low sodium heart healthy  As directed     Increase activity slowly  As directed     Weight bearing as tolerated  As directed         Medication List    TAKE these medications       acyclovir 200 MG capsule  Commonly known as:  ZOVIRAX  Pt takes on Sunday, Wednesday,and Saturday     amitriptyline 50 MG tablet  Commonly known as:  ELAVIL  Take 50 mg by mouth at bedtime.     aspirin EC 325 MG tablet  Take 325 mg by mouth daily.     furosemide 20 MG tablet  Commonly known as:  LASIX  Take 20 mg by mouth daily.     gabapentin 300 MG capsule  Commonly known as:  NEURONTIN  Take 300 mg by mouth 3 (three) times daily.     HYDROcodone-acetaminophen 5-325 MG per tablet  Commonly known as:  NORCO/VICODIN  Take 1-2 tablets by mouth every 6 (six) hours as needed.     insulin aspart 100 UNIT/ML injection  Commonly known as:  novoLOG  Inject 0-15 Units into the skin as needed. Sugar level less than 150 do  not take any Novolog, if sugar level is between 151-200 take 3 units, 201-250 take 5 units, 251-300 take 7 units 301-350 take 9 units, 351-400 take 11 units, sugar level over 400 take 15 units and notify docotor     insulin detemir 100 UNIT/ML injection  Commonly known as:  LEVEMIR  Inject 30 Units into the skin at bedtime.     multivitamin with minerals Tabs  Take 1 tablet by mouth daily.     predniSONE 1 MG tablet  Commonly known as:  DELTASONE  Take 3 mg by mouth every morning.     traMADol 50 MG tablet  Commonly known as:  ULTRAM  Take 50 mg by mouth every 8 (eight) hours as needed for pain.       Allergies  Allergen Reactions  . Allegra (Fexofenadine Hcl)   . Codeine Nausea And Vomiting  . Levaquin (Levofloxacin In D5w)   . Morphine And Related Nausea And Vomiting  . Tussionex Pennkinetic Er (Hydrocod Polst-Cpm Polst Er)        Follow-up Information   Follow up with [name],[name], MD. Schedule an appointment as soon as possible for a visit in 2 weeks.   Contact information:   [address] [place] [address] [phone]        The results of significant diagnostics from this hospitalization (including imaging, microbiology, ancillary and laboratory) are listed below for reference.    Significant Diagnostic Studies: Dg Chest 1 View  08/02/2012   *RADIOLOGY REPORT*  Clinical Data: Preoperative evaluation prior to repair of right hip fracture.  CHEST - 1 VIEW  Comparison: Chest x-ray 05/17/2012.  Findings: Chronic scarring at the left base is unchanged. Lung volumes are normal.  No consolidative airspace disease.  No pleural effusions.  No pneumothorax.  No pulmonary nodule or mass noted. Pulmonary vasculature and the cardiomediastinal silhouette are within normal limits.  Atherosclerosis of the thoracic aorta. Orthopedic fixation hardware in the lower cervical spine incompletely visualized.  IMPRESSION: 1.  No radiographic evidence of acute cardiopulmonary  disease. 2.  Atherosclerosis. 3.  Chronic scarring at the left base is unchanged.   Original Report Authenticated By: Trudie Reed, M.D.   Dg Shoulder Right  08/02/2012   *RADIOLOGY REPORT*  Clinical Data: History of fall complaining of right shoulder pain.  RIGHT SHOULDER - 2+ VIEW  Comparison: No priors.  Findings: Three views of the right shoulder demonstrate no acute displaced fracture, subluxation, dislocation, joint or soft tissue abnormality.  IMPRESSION: 1.  No acute radiographic abnormality of the right shoulder.   Original Report Authenticated By: Trudie Reed,  M.D.   Dg Hip Complete Right  08/02/2012   *RADIOLOGY REPORT*  Clinical Data: History of fall complaining of right-sided hip pain.  RIGHT HIP - COMPLETE 2+ VIEW  Comparison: No priors.  Findings: AP view of the pelvis and AP and lateral views of the right hip demonstrate an acute mildly displaced transcervical right femoral neck fracture.  There appears to be mild superolateral migration of the distal fracture fragment, and although the lateral view is suboptimal, there is approximately 30 degrees of dorsal angulation.  The bony pelvis itself appears intact.  Postoperative changes of left total hip arthroplasty are noted.  The distal end of a right-sided double-J ureteral stent is also noted.  IMPRESSION: 1.  Acute mildly displaced and angulated transcervical right femoral neck fracture, as above.   Original Report Authenticated By: Trudie Reed, M.D.   Dg Pelvis Portable  08/03/2012   *RADIOLOGY REPORT*  Clinical Data: Postop right hip replacement.  PORTABLE PELVIS  Comparison: 05/14/2012  Findings: Remote changes of left hip replacement.  Interval changes of right hip replacement.  No acute bony abnormality.  No hardware or bony complicating feature.  Normal AP alignment.  Soft tissue gas noted.  IMPRESSION: Interval right hip replacement.  No visible complicating feature.   Original Report Authenticated By: Charlett Nose, M.D.     Microbiology: Recent Results (from the past 240 hour(s))  URINE CULTURE     Status: None   Collection Time    08/03/12  1:37 AM      Result Value Range Status   Specimen Description URINE, CATHETERIZED   Final   Special Requests NONE   Final   Culture  Setup Time 08/03/2012 10:17   Final   Colony Count >=100,000 COLONIES/ML   Final   Culture     Final   Value: Multiple bacterial morphotypes present, none predominant. Suggest appropriate recollection if clinically indicated.   Report Status 08/04/2012 FINAL   Final  SURGICAL PCR SCREEN     Status: None   Collection Time    08/03/12  7:03 AM      Result Value Range Status   MRSA, PCR NEGATIVE  NEGATIVE Final   Staphylococcus aureus NEGATIVE  NEGATIVE Final   Comment:            The Xpert SA Assay (FDA     approved for NASAL specimens     in patients over 38 years of age),     is one component of     a comprehensive surveillance     program.  Test performance has     been validated by [employer] for patients greater     than or equal to 84 year old.     It is not intended     to diagnose infection nor to     guide or monitor treatment.     Labs: Basic Metabolic Panel:  Recent Labs Lab 08/02/12 1604 08/03/12 0415 08/04/12 0330 08/05/12 0525  NA 136 136 135 132*  K 4.7 4.1 4.1 5.2*  CL 100 99 99 97  CO2 29 28 29 22   GLUCOSE 164* 139* 163* 233*  BUN 18 15 17 18   CREATININE 0.84 0.78 0.96 1.05  CALCIUM 8.9 8.8 8.2* 8.6   Liver Function Tests:  Recent Labs Lab 08/02/12 1604  AST 25  ALT 26  ALKPHOS 90  BILITOT 0.4  PROT 5.9*  ALBUMIN 2.9*   No results found for this basename: LIPASE,  AMYLASE,  in the last 168 hours No results found for this basename: AMMONIA,  in the last 168 hours CBC:  Recent Labs Lab 08/02/12 1604 08/03/12 0415 08/04/12 0330 08/05/12 0525  WBC 6.0 7.4 7.3 9.0  HGB 9.4* 9.7* 9.6* 10.4*  HCT 29.4* 31.1* 30.2* 32.2*  MCV 94.2 95.1 92.9 94.2  PLT 143* 130* PLATELET  CLUMPS NOTED ON SMEAR, COUNT APPEARS ADEQUATE 165   Cardiac Enzymes: No results found for this basename: CKTOTAL, CKMB, CKMBINDEX, TROPONINI,  in the last 168 hours BNP: BNP (last 3 results)  Recent Labs  03/20/12 1008  PROBNP 1166.0*   CBG:  Recent Labs Lab 08/04/12 1703 08/04/12 2115 08/05/12 0552 08/05/12 0748 08/05/12 1210  GLUCAP 155* 150* 239* 188* 178*       Signed:  FELIZ ORTIZ, Mandisa Persinger  [facility] 08/05/2012, 12:50 PM

## 2012-08-05 NOTE — Progress Notes (Signed)
[facility] PROGRESS NOTE  Assessment/Plan: *Fracture of femoral neck, right, closed: - Ortho consult for surgery 5.03.2014. - PT consult recommended SNF.Marland Kitchen - cont oral narcotics. - home in am.  Anemia, unspecified: - Hbg 9.6-> 10.7 - Cbc in am, RDW high, MCV in 90's. - RBC folate and B12  CHF (congestive heart failure): - No JVD, lung are clear. - cont to hold diuretics  Immune thrombocytopenia: - PLT's stable. - monitor.   Diabetes mellitus: - well control.  HTN (hypertension): - stable. - resume meds as an outpatient     Code Status: Full Code.  Family Communication: none at bedside.  Disposition Plan: SNF     Consultants:  ORtho  Procedures:  ORIF  Antibiotics:  None  HPI/Subjective:  No pain.  Objective: Filed Vitals:   08/04/12 1527 08/04/12 2117 08/05/12 0545 08/05/12 0904  BP:  97/52 83/53 103/62  Pulse:   102 88  Temp:  98.7 F (37.1 C) 97.3 F (36.3 C)   TempSrc:  Oral Oral   Resp: 18 18 18    Height:      Weight:      SpO2:  97% 94%     Intake/Output Summary (Last 24 hours) at 08/05/12 1147 Last data filed at 08/05/12 0940  Gross per 24 hour  Intake   1860 ml  Output   1002 ml  Net    858 ml   Filed Weights   08/02/12 2211 08/03/12 2300  Weight: 67.132 kg (148 lb) 75.5 kg (166 lb 7.2 oz)    Exam:  General: Alert, awake, oriented x3, in no acute distress.  HEENT: No bruits, no goiter. No JVD, dry mouth. Heart: Regular rate and rhythm, without murmurs, rubs, gallops.  Lungs: Good air movement, clear to auscultation Abdomen: Soft, nontender, nondistended, positive bowel sounds.  Neuro: Grossly intact, nonfocal.   Data Reviewed: Basic Metabolic Panel:  Recent Labs Lab 08/02/12 1604 08/03/12 0415 08/04/12 0330 08/05/12 0525  NA 136 136 135 132*  K 4.7 4.1 4.1 5.2*  CL 100 99 99 97  CO2 29 28 29 22   GLUCOSE 164* 139* 163* 233*  BUN 18 15 17 18   CREATININE 0.84 0.78 0.96 1.05  CALCIUM 8.9 8.8 8.2* 8.6    Liver Function Tests:  Recent Labs Lab 08/02/12 1604  AST 25  ALT 26  ALKPHOS 90  BILITOT 0.4  PROT 5.9*  ALBUMIN 2.9*   No results found for this basename: LIPASE, AMYLASE,  in the last 168 hours No results found for this basename: AMMONIA,  in the last 168 hours CBC:  Recent Labs Lab 08/02/12 1604 08/03/12 0415 08/04/12 0330 08/05/12 0525  WBC 6.0 7.4 7.3 9.0  HGB 9.4* 9.7* 9.6* 10.4*  HCT 29.4* 31.1* 30.2* 32.2*  MCV 94.2 95.1 92.9 94.2  PLT 143* 130* PLATELET CLUMPS NOTED ON SMEAR, COUNT APPEARS ADEQUATE 165   Cardiac Enzymes: No results found for this basename: CKTOTAL, CKMB, CKMBINDEX, TROPONINI,  in the last 168 hours BNP (last 3 results)  Recent Labs  03/20/12 1008  PROBNP 1166.0*   CBG:  Recent Labs Lab 08/04/12 1221 08/04/12 1703 08/04/12 2115 08/05/12 0552 08/05/12 0748  GLUCAP 125* 155* 150* 239* 188*    Recent Results (from the past 240 hour(s))  URINE CULTURE     Status: None   Collection Time    08/03/12  1:37 AM      Result Value Range Status   Specimen Description URINE, CATHETERIZED   Final   Special  Requests NONE   Final   Culture  Setup Time 08/03/2012 10:17   Final   Colony Count >=100,000 COLONIES/ML   Final   Culture     Final   Value: Multiple bacterial morphotypes present, none predominant. Suggest appropriate recollection if clinically indicated.   Report Status 08/04/2012 FINAL   Final  SURGICAL PCR SCREEN     Status: None   Collection Time    08/03/12  7:03 AM      Result Value Range Status   MRSA, PCR NEGATIVE  NEGATIVE Final   Staphylococcus aureus NEGATIVE  NEGATIVE Final   Comment:            The Xpert SA Assay (FDA     approved for NASAL specimens     in patients over 9 years of age),     is one component of     a comprehensive surveillance     program.  Test performance has     been validated by [employer] for patients greater     than or equal to 40 year old.     It is not intended     to  diagnose infection nor to     guide or monitor treatment.     Studies: Dg Pelvis Portable  08/03/2012   *RADIOLOGY REPORT*  Clinical Data: Postop right hip replacement.  PORTABLE PELVIS  Comparison: 05/14/2012  Findings: Remote changes of left hip replacement.  Interval changes of right hip replacement.  No acute bony abnormality.  No hardware or bony complicating feature.  Normal AP alignment.  Soft tissue gas noted.  IMPRESSION: Interval right hip replacement.  No visible complicating feature.   Original Report Authenticated By: Charlett Nose, M.D.    Scheduled Meds: . antiseptic oral rinse  15 mL Mouth Rinse q12n4p  . aspirin EC  325 mg Oral Q breakfast  . chlorhexidine  15 mL Mouth Rinse BID  . docusate sodium  100 mg Oral BID  . feeding supplement  237 mL Oral BID PC  . gabapentin  300 mg Oral TID  . insulin aspart  0-9 Units Subcutaneous TID WC  . insulin detemir  30 Units Subcutaneous QHS  . multivitamin with minerals  1 tablet Oral Daily  . predniSONE  3 mg Oral Q breakfast   Continuous Infusions: . sodium chloride 75 mL/hr at 08/05/12 0857     Marinda Elk  [facility] Pager [phone]. If 8PM-8AM, please contact night-coverage at [email or web address], password [email or web address] 08/05/2012, 11:47 AM  LOS: 3 days

## 2012-08-06 ENCOUNTER — Encounter (HOSPITAL_COMMUNITY): Payer: Self-pay | Admitting: Orthopaedic Surgery

## 2012-08-06 LAB — TYPE AND SCREEN
Unit division: 0
Unit division: 0

## 2012-08-06 LAB — BASIC METABOLIC PANEL
BUN: 21 mg/dL (ref 6–23)
CO2: 29 mEq/L (ref 19–32)
Chloride: 99 mEq/L (ref 96–112)
Creatinine, Ser: 0.72 mg/dL (ref 0.50–1.10)

## 2012-08-06 LAB — CBC
HCT: 25.4 % — ABNORMAL LOW (ref 36.0–46.0)
MCV: 92.4 fL (ref 78.0–100.0)
Platelets: 113 10*3/uL — ABNORMAL LOW (ref 150–400)
RBC: 2.75 MIL/uL — ABNORMAL LOW (ref 3.87–5.11)
WBC: 6.5 10*3/uL (ref 4.0–10.5)

## 2012-08-06 LAB — GLUCOSE, CAPILLARY: Glucose-Capillary: 91 mg/dL (ref 70–99)

## 2012-08-06 LAB — FOLATE RBC: RBC Folate: 1371 ng/mL — ABNORMAL HIGH (ref >=366)

## 2012-08-06 NOTE — Progress Notes (Signed)
Patient cleared for discharge. Packet copied and placed in [place]. Pending auth from [place].  Aleathia Purdy C. Kebin Maye MSW, LCSW [phone]

## 2012-08-06 NOTE — Plan of Care (Signed)
Problem: Discharge Progression Outcomes Goal: Ambulates safely using assistive device Outcome: Progressing Continue therapy in SNF-rehab Goal: Activity appropriate for discharge plan Outcome: Progressing Continue in rehab-SNF

## 2012-08-06 NOTE — Discharge Summary (Addendum)
Physician Discharge Summary  Patient ID: [name] MRN: [id number] DOB/AGE: 77-Feb-1926 77 y.o.  Admit date: 08/02/2012 Discharge date: 08/06/2012  Admission Diagnoses:fall with right displaced femoral neck fracture  Discharge Diagnoses: same,   Right hip Monopolar Hemiarthroplasty Principal Problem:   Fracture of femoral neck, right, closed Active Problems:   Diabetes mellitus   HTN (hypertension)   Immune thrombocytopenia   Chronic diastolic congestive heart failure   Anemia, unspecified   Hypotension, unspecified   Discharged Condition: stable  Hospital Course: Underwent surgery for femoral neck fracture with above procedure.   ASA and SCD's for prophylaxis for DVT.  ambulatory with  PT and released for [facility] when bed available.   Consults: PT, OT,  Social service consult for placment SNF  Significant Diagnostic Studies: post op xrays good position and allignment  Treatments: surgery: as above   Discharge Exam: Blood pressure 109/61, pulse 73, temperature 98 F (36.7 C), temperature source Oral, resp. rate 16, height 5\' 1"  (1.549 m), weight 79.6 kg (175 lb 7.8 oz), SpO2 99.00%. Leg length equal , ambulatory with PT.   Disposition: 01-Home or Self Care  Discharge Orders   Future Appointments Provider Department Dept Phone   10/22/2012 8:30 AM [facility] [phone]   10/24/2012 3:15 PM [name], MD [facility] [phone]   Future Orders Complete By Expires     Diet - low sodium heart healthy  As directed     Increase activity slowly  As directed     Weight bearing as tolerated  As directed         Medication List    TAKE these medications       acyclovir 200 MG capsule  Commonly known as:  ZOVIRAX  Pt takes on Sunday, Wednesday,and Saturday     amitriptyline 50 MG tablet  Commonly known as:  ELAVIL  Take 50 mg by mouth at bedtime.     aspirin EC 325 MG tablet  Take 325 mg by mouth daily.     furosemide 20  MG tablet  Commonly known as:  LASIX  Take 20 mg by mouth daily.     gabapentin 300 MG capsule  Commonly known as:  NEURONTIN  Take 300 mg by mouth 3 (three) times daily.     HYDROcodone-acetaminophen 5-325 MG per tablet  Commonly known as:  NORCO/VICODIN  Take 1-2 tablets by mouth every 6 (six) hours as needed.     insulin aspart 100 UNIT/ML injection  Commonly known as:  novoLOG  Inject 0-15 Units into the skin as needed. Sugar level less than 150 do not take any Novolog, if sugar level is between 151-200 take 3 units, 201-250 take 5 units, 251-300 take 7 units 301-350 take 9 units, 351-400 take 11 units, sugar level over 400 take 15 units and notify docotor     insulin detemir 100 UNIT/ML injection  Commonly known as:  LEVEMIR  Inject 30 Units into the skin at bedtime.     multivitamin with minerals Tabs  Take 1 tablet by mouth daily.     predniSONE 1 MG tablet  Commonly known as:  DELTASONE  Take 3 mg by mouth every morning.     traMADol 50 MG tablet  Commonly known as:  ULTRAM  Take 50 mg by mouth every 8 (eight) hours as needed for pain.           Follow-up Information   Follow up with Eldred Manges, MD. Schedule an  appointment as soon as possible for a visit in 2 weeks.   Contact information:   [address] [place] [address] [phone]     transfer to SNF  Wwhen acceptable  By Dr. Robb Matar.    From my standpoint she is ready.    SignedEldred Manges 08/06/2012, 9:57 AM

## 2012-08-06 NOTE — Progress Notes (Signed)
Occupational Therapy Treatment Patient Details Name: [name] MRN: [id number] DOB: 04-Jul-1924 Today's Date: 08/06/2012 Time: 4098-1191 OT Time Calculation (min): 24 min  OT Assessment / Plan / Recommendation Comments on Treatment Session Pt tolerated session well with functional mobility using RW and also provided education for AE use. Pt supposed to d/c to SNF possibly today.    Follow Up Recommendations  SNF;Supervision/Assistance - 24 hour    Barriers to Discharge       Equipment Recommendations  3 in 1 bedside comode    Recommendations for Other Services    Frequency Min 2X/week   Plan Discharge plan remains appropriate    Precautions / Restrictions Precautions Precautions: Posterior Hip;Fall Precaution Comments: Pt unable to recall any THP, therefore re-educated during session.  Restrictions Weight Bearing Restrictions: No Other Position/Activity Restrictions: WBAT       ADL  Toilet Transfer: +2 Total assistance Toilet Transfer: Patient Percentage: 60% Toilet Transfer Method: Sit to stand;Other (comment) (took some steps out in hallway. See PT note. Chair pulled up) Equipment Used: Rolling walker ADL Comments: Reviewed all hip precautions and how they apply to ADL. Also educated on AE options available and coverage. Demonstrated AE and pt verbalized understanding and would benefit from practice with AE. Did better with bed mobility in prep for ADL today. +2total 60% to take some steps with RW today. See PT note for details.    OT Diagnosis:    OT Problem List:   OT Treatment Interventions:     OT Goals Acute Rehab OT Goals OT Goal Formulation: With patient Time For Goal Achievement: 08/11/12 Potential to Achieve Goals: Good ADL Goals Pt Will Perform Grooming: with min assist;Standing at sink;Unsupported (1 task) Pt Will Perform Lower Body Dressing: with min assist;Sit to stand from chair;Sit to stand from bed;Unsupported;with adaptive equipment Pt Will  Transfer to Toilet: with min assist;Ambulation;with DME;Comfort height toilet;3-in-1;Grab bars;Maintaining hip precautions ADL Goal: Toilet Transfer - Progress: Progressing toward goals Miscellaneous OT Goals Miscellaneous OT Goal #1: Pt will be Min A to get up to EOB for BADLs OT Goal: Miscellaneous Goal #1 - Progress: Progressing toward goals  Visit Information  Last OT Received On: 08/06/12 Assistance Needed: +2 PT/OT Co-Evaluation/Treatment: Yes    Subjective Data  Subjective: I finished breakfast Patient Stated Goal: none stated. agreeable to work with PT/OT   Prior Functioning       Cognition  Cognition Arousal/Alertness: Awake/alert Behavior During Therapy: WFL for tasks assessed/performed Overall Cognitive Status: Within Functional Limits for tasks assessed    Mobility  Bed Mobility Bed Mobility: Supine to Sit Supine to Sit: 1: +2 Total assist;HOB elevated;With rails Supine to Sit: Patient Percentage: 70% Details for Bed Mobility Assistance: Pt did much better today with getting LEs out of bed and with trunk.  Some guiding with legs and min/guard for trunk and cues for hand placement and technique.  Transfers Sit to Stand: 1: +2 Total assist Sit to Stand: Patient Percentage: 60% Stand to Sit: 1: +2 Total assist;With upper extremity assist;With armrests;To chair/3-in-1 Stand to Sit: Patient Percentage: 60% Details for Transfer Assistance: Continues to require assist to rise, steady and ensure controlled descent with cues for hand placement and LE management to maintain THP when sitting/standing.        Balance     End of Session OT - End of Session Equipment Utilized During Treatment: Gait belt Activity Tolerance: Patient tolerated treatment well Patient left: in chair;with call bell/phone within reach  GO  Judithann Sauger [place] [phone] 08/06/2012, 10:02 AM

## 2012-08-06 NOTE — Progress Notes (Signed)
Received referral from COPD [facility] program. Patient was engaged with [facility] Care Management telephonically in the recent past. Appears discharge plan is SNF after this hospital discharge. [facility] Care Management not appropriate at this time as discharge plan is SNF.  Raiford Noble, MSN- Ed, RN,BSN- -[facility] Liaison-- [phone]

## 2012-08-06 NOTE — Progress Notes (Signed)
Patient discharged to SNF [facility] and [place] [facility]) via ambulance, Patient discharge packet prepared by CSW and given to the EMS transporter for the facility. Surgical incision clean, dry, intact, staples intact, dressing changed prior to discharge. Scab on right knee from fall at home open to air, no sign of infection. No pressure ulcer noted. Report given to Sam- Environmental education officer at [employer].

## 2012-08-06 NOTE — Progress Notes (Signed)
Physical Therapy Treatment Patient Details Name: [name] MRN: [id number] DOB: Aug 07, 1924 Today's Date: 08/06/2012 Time: 2952-8413 PT Time Calculation (min): 26 min  PT Assessment / Plan / Recommendation Comments on Treatment Session  Pt with marked improvement in bed mobility today and also able to ambulate into hallway today.  May D/C to [facility] today.     Follow Up Recommendations  SNF;Supervision/Assistance - 24 hour     Does the patient have the potential to tolerate intense rehabilitation     Barriers to Discharge        Equipment Recommendations  None recommended by PT    Recommendations for Other Services    Frequency Min 3X/week   Plan Discharge plan remains appropriate    Precautions / Restrictions Precautions Precautions: Posterior Hip;Fall Precaution Comments: Pt unable to recall any THP, therefore re-educated during session.  Restrictions Weight Bearing Restrictions: No Other Position/Activity Restrictions: WBAT   Pertinent Vitals/Pain Pt with some pain in R hip, however improved from over night.     Mobility  Bed Mobility Bed Mobility: Supine to Sit Supine to Sit: 1: +2 Total assist;HOB elevated;With rails Supine to Sit: Patient Percentage: 70% Details for Bed Mobility Assistance: Pt did much better today with getting LEs out of bed and with trunk.  Some guiding with legs and min/guard for trunk and cues for hand placement and technique.  Transfers Transfers: Sit to Stand;Stand to Sit Sit to Stand: 1: +2 Total assist Sit to Stand: Patient Percentage: 60% Stand to Sit: 1: +2 Total assist;With upper extremity assist;With armrests;To chair/3-in-1 Stand to Sit: Patient Percentage: 60% Details for Transfer Assistance: Continues to require assist to rise, steady and ensure controlled descent with cues for hand placement and LE management to maintain THP when sitting/standing.  Ambulation/Gait Ambulation/Gait Assistance: 1: +2 Total  assist Ambulation/Gait: Patient Percentage: 60% Ambulation Distance (Feet): 18 Feet Assistive device: Rolling walker Ambulation/Gait Assistance Details: Assist to steady throughout with cues for sequencing/technique with RW and to maintain upright posture.   Gait Pattern: Step-to pattern;Decreased stride length;Antalgic;Trunk flexed Gait velocity: decreased    Exercises Total Joint Exercises Ankle Circles/Pumps: AROM;Both;10 reps Quad Sets: AROM;Right;10 reps Short Arc QuadBarbaraann Boys;Right;10 reps Heel Slides: AAROM;Right;10 reps Hip ABduction/ADduction: AAROM;Right;10 reps   PT Diagnosis:    PT Problem List:   PT Treatment Interventions:     PT Goals Acute Rehab PT Goals PT Goal Formulation: With patient Time For Goal Achievement: 08/11/12 Potential to Achieve Goals: Good Pt will go Supine/Side to Sit: with min assist PT Goal: Supine/Side to Sit - Progress: Progressing toward goal Pt will go Sit to Stand: with min assist PT Goal: Sit to Stand - Progress: Progressing toward goal Pt will go Stand to Sit: with min assist PT Goal: Stand to Sit - Progress: Progressing toward goal Pt will Transfer Bed to Chair/Chair to Bed: with min assist PT Transfer Goal: Bed to Chair/Chair to Bed - Progress: Progressing toward goal Pt will Ambulate: 16 - 50 feet;with mod assist;with least restrictive assistive device PT Goal: Ambulate - Progress: Progressing toward goal Pt will Perform Home Exercise Program: with supervision, verbal cues required/provided PT Goal: Perform Home Exercise Program - Progress: Progressing toward goal  Visit Information  Last PT Received On: 08/06/12 Assistance Needed: +2    Subjective Data  Subjective: I had a rough night last night, but I'm better now.  Patient Stated Goal: to not go to rehab unless I have to   Cognition  Cognition Arousal/Alertness: Awake/alert Behavior During Therapy:  WFL for tasks assessed/performed Overall Cognitive Status: Within  Functional Limits for tasks assessed    Balance     End of Session PT - End of Session Equipment Utilized During Treatment: Gait belt Activity Tolerance: Patient limited by fatigue Patient left: in chair;with call bell/phone within reach Nurse Communication: Mobility status   GP     Vista Deck 08/06/2012, 9:17 AM

## 2012-08-06 NOTE — Progress Notes (Signed)
Clinical Social Work Department CLINICAL SOCIAL WORK PLACEMENT NOTE 08/06/2012  Patient:  [name], [name]  Account Number:  [id number] Admit date:  08/02/2012  Clinical Social Worker:  Doroteo Glassman  Date/time:  08/05/2012 04:06 PM  Clinical Social Work is seeking post-discharge placement for this patient at the following level of care:   SKILLED NURSING   (*CSW will update this form in Epic as items are completed)   08/06/2012  Patient/family provided with [facility] Department of Clinical Social Work's list of facilities offering this level of care within the geographic area requested by the patient (or if unable, by the patient's family).  08/05/2012  Patient/family informed of their freedom to choose among providers that offer the needed level of care, that participate in Medicare, Medicaid or managed care program needed by the patient, have an available bed and are willing to accept the patient.  08/06/2012  Patient/family informed of [facility]' ownership interest in [facility], as well as of the fact that they are under no obligation to receive care at this facility.  PASARR submitted to EDS on 08/06/2012 PASARR number received from EDS on 08/06/2012  FL2 transmitted to all facilities in geographic area requested by pt/family on  08/05/2012 FL2 transmitted to all facilities within larger geographic area on   Patient informed that his/her managed care company has contracts with or will negotiate with  certain facilities, including the following:     Patient/family informed of bed offers received:  08/06/2012 Patient chooses bed at [facility] Physician recommends and patient chooses bed at    Patient to be transferred to [facility] on  08/06/2012 Patient to be transferred to facility by ptar  The following physician request were entered in Epic:   Additional Comments:

## 2012-08-16 ENCOUNTER — Other Ambulatory Visit (HOSPITAL_COMMUNITY): Payer: Self-pay | Admitting: Interventional Radiology

## 2012-08-16 DIAGNOSIS — D3001 Benign neoplasm of right kidney: Secondary | ICD-10-CM

## 2012-08-16 DIAGNOSIS — N2889 Other specified disorders of kidney and ureter: Secondary | ICD-10-CM

## 2012-09-06 ENCOUNTER — Encounter: Payer: Self-pay | Admitting: Radiology

## 2012-09-22 ENCOUNTER — Other Ambulatory Visit: Payer: Self-pay | Admitting: Internal Medicine

## 2012-10-10 ENCOUNTER — Other Ambulatory Visit: Payer: Self-pay

## 2012-10-11 ENCOUNTER — Other Ambulatory Visit: Payer: Self-pay | Admitting: Internal Medicine

## 2012-10-22 ENCOUNTER — Other Ambulatory Visit: Payer: Medicare HMO

## 2012-10-23 ENCOUNTER — Telehealth: Payer: Self-pay | Admitting: Radiology

## 2012-10-23 NOTE — Telephone Encounter (Signed)
Left message requesting patient call to schedule follow up Cryoablation of Right Renal mass--- imaging & appointment with Dr Fredia Sorrow.    [name] [name], [place] 10/23/2012 10:13 AM

## 2012-10-24 ENCOUNTER — Encounter: Payer: Self-pay | Admitting: Internal Medicine

## 2012-10-24 ENCOUNTER — Ambulatory Visit (INDEPENDENT_AMBULATORY_CARE_PROVIDER_SITE_OTHER): Payer: Medicare HMO | Admitting: Internal Medicine

## 2012-10-24 VITALS — BP 120/70 | HR 74 | Temp 98.2°F | Resp 16 | Ht 61.0 in | Wt 157.0 lb

## 2012-10-24 DIAGNOSIS — F329 Major depressive disorder, single episode, unspecified: Secondary | ICD-10-CM

## 2012-10-24 DIAGNOSIS — R269 Unspecified abnormalities of gait and mobility: Secondary | ICD-10-CM

## 2012-10-24 DIAGNOSIS — D3000 Benign neoplasm of unspecified kidney: Secondary | ICD-10-CM

## 2012-10-24 DIAGNOSIS — D649 Anemia, unspecified: Secondary | ICD-10-CM

## 2012-10-24 DIAGNOSIS — R0981 Nasal congestion: Secondary | ICD-10-CM

## 2012-10-24 DIAGNOSIS — E785 Hyperlipidemia, unspecified: Secondary | ICD-10-CM

## 2012-10-24 DIAGNOSIS — M353 Polymyalgia rheumatica: Secondary | ICD-10-CM

## 2012-10-24 DIAGNOSIS — J3489 Other specified disorders of nose and nasal sinuses: Secondary | ICD-10-CM

## 2012-10-24 DIAGNOSIS — E119 Type 2 diabetes mellitus without complications: Secondary | ICD-10-CM

## 2012-10-24 DIAGNOSIS — I10 Essential (primary) hypertension: Secondary | ICD-10-CM

## 2012-10-24 DIAGNOSIS — G459 Transient cerebral ischemic attack, unspecified: Secondary | ICD-10-CM

## 2012-10-24 MED ORDER — CETIRIZINE-PSEUDOEPHEDRINE ER 5-120 MG PO TB12: 1.0000 | ORAL_TABLET | Freq: Two times a day (BID) | ORAL | Status: DC

## 2012-10-24 NOTE — Patient Instructions (Addendum)
Stop diclofenac. Try to taper off the gabapentin by reducing one capsule weekly. Continue the other medications

## 2012-10-24 NOTE — Progress Notes (Signed)
Subjective:    Patient ID: [name], female    DOB: [birth year], 79 y.o.   MRN: [id number]  HPI This patient has had a complicated year from a medical standpoint. She was hospitalized 03/20/12 through 03/23/12 with pneumonia and delirium. She has had debulking procedures of a renal cancer and radiology. Dr. Annabell Howells continues to follow this problem.  She was rehospitalized 06/06/12 through 06/13/12 with dehydration and recurrent urinary tract infection she had a systemic inflammatory response syndrome.  She was rehospitalized 08/02/12 through 08/06/12 following a fall with a right hip fracture. Patient had right hip surgery 08/03/12 by Dr. Annell Greening.  She had a TIA on 10/22/12. She had slurred speech and word loss. There are persistent issues with walking, and seems quite wobbly. It is unclear whether this is related to her previous hip fracture or a more durable neurologic defect.  Past Medical History  Diagnosis Date  . HTN (hypertension)   . Osteoporosis   . 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 COPD (chronic obstructive pulmonary disease)     no inhalers used   . PONV (postoperative nausea and vomiting)     in past, none recently  . History of TIA (transient ischemic attack) 2012--  NO RESIDUAL  . CHF (congestive heart failure)   . Arthritis     BACK , NECK, AND JOINTS  . Temporal arteritis   . Chronic steroid use   . 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 Macular degeneration of both eyes   . Diabetic peripheral neuropathy   . Insulin dependent diabetes mellitus   . Chronic cystitis   . Hemoperitoneum (nontraumatic)   . Malignant neoplasm of renal pelvis   . Urinary frequency   . Unspecified late effects of cerebrovascular disease   . Abnormality of gait   . Osteoporosis, unspecified   . Anxiety state, unspecified   . Herpes zoster with other nervous system complications(053.19)   . Anemia, unspecified    . Type I (juvenile type) diabetes mellitus without mention of complication, uncontrolled   . Other and unspecified hyperlipidemia   . Personality change due to conditions classified elsewhere   . Unspecified essential hypertension   . Dermatophytosis of nail   . Depressive disorder, not elsewhere classified   . Diaphragmatic hernia without mention of obstruction or gangrene    Past Surgical History  Procedure Laterality Date  . Lumbar laminectomy  11-19-2003    L3 -- L4  . Total hip arthroplasty Left 01-22-2005  . Total knee arthroplasty Bilateral LEFT 08-08-2007;  RIGHT 05-08-2009  . Anterior cervical decomp/discectomy fusion  10-12-2005    C5 -- C7  . Posterior fusion cervical spine  02-10-2006    C5 -- C7  . Orif w/ plate right distal radius fx  06-18-2003  . Cataract extraction w/ intraocular lens  implant, bilateral    . Cryoablation right renal mass  03-16-2012    ONCOCYTOMA (POST OP 06-09-2012 PERCUTANEOUS DRAIN PLACEMENT FOR PERINEPHRIC ABSCESS)  . Temporal artery biopsy / ligation    . Elbow surgery Bilateral 2003  (APPROX)  . Carpal tunnel release Right 2009  (APPROX)  . Cystoscopy w/ ureteral stent placement Right 07/10/2012    Procedure: CYSTOSCOPY WITH RIGHT RETROGRADE PYELOGRAM/URETERAL STENT PLACEMENT;  Surgeon: Anner Crete, MD;  Location: [facility];  Service: Urology;  Laterality: Right;  . Hip arthroplasty Right 08/03/2012    Procedure: CEMENTED MONOPOLAR HIP;  Surgeon: Eldred Manges, MD;  Location: [facility];  Service: Orthopedics;  Laterality: Right;   Family Status  Relation Status Death Age  . Mother Deceased     Breast cancer  . Father Deceased     Lung cancer  . Brother Deceased     Multiple myeloma  . Daughter Alive     Congestive heart failure, chronic lung disease  . Sister Deceased     Rheumatoid arthritis, diabetes mellitus  . Son Alive     Coronary artery disease, ulcers, emphysema, skin cancer  . Sister Deceased   . Daughter  Alive     Chronic bronchitis  . Daughter Deceased    Family History  Problem Relation Age of Onset  . Leukemia    . Cancer Mother   . Cancer Father   . Cancer Brother   . Cancer Daughter    History   Social History  . Marital Status: Widowed    Spouse Name: N/A    Number of Children: N/A  . Years of Education: N/A   Social History Main Topics  . Smoking status: Former Smoker -- 2.00 packs/day for 30 years    Types: Cigarettes    Quit date: 01/11/1983  . Smokeless tobacco: Never Used  . Alcohol Use: No  . Drug Use: No  . Sexual Activity: None   Other Topics Concern  . None   Social History Narrative   Limited 2002.   Lives in her apartment. Has a living will.   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             There is no immunization history on file for this patient.   Current Outpatient Prescriptions on File Prior to Visit  Medication Sig Dispense Refill  . acyclovir (ZOVIRAX) 200 MG capsule Pt takes on Sunday, Wednesday,and Saturday      . acyclovir (ZOVIRAX) 200 MG capsule TAKE ONE CAPSULE BY MOUTH ONCE DAILY TO  PREVENT  RELAPSE  OF  HERPES  SIMPLEX  30 capsule  3  . amitriptyline (ELAVIL) 50 MG tablet Take 50 mg by mouth at bedtime.      Marland Kitchen aspirin EC 325 MG tablet Take 325 mg by mouth daily.       . furosemide (LASIX) 20 MG tablet Take 20 mg by mouth daily.      Marland Kitchen gabapentin (NEURONTIN) 300 MG capsule TAKE TWO CAPSULES BY MOUTH THREE TIMES DAILY TO HELP CONTROL PAIN  180 capsule  0  . insulin aspart (NOVOLOG) 100 UNIT/ML injection Inject 0-15 Units into the skin as needed. Sugar level less than 150 do not take any Novolog, if sugar level is between 151-200 take 3 units, 201-250 take 5 units, 251-300 take 7 units 301-350 take 9 units, 351-400 take 11 units, sugar level over 400 take 15 units and notify docotor      . insulin detemir (LEVEMIR) 100 UNIT/ML injection Inject 30 Units into the skin at bedtime.      .  Multiple Vitamin (MULTIVITAMIN WITH MINERALS) TABS Take 1 tablet by mouth daily.      . predniSONE (DELTASONE) 1 MG tablet Take 3 mg by mouth every morning.      . traMADol (ULTRAM) 50 MG tablet Take 50 mg by mouth every 8 (eight) hours as needed for pain.      Marland Kitchen HYDROcodone-acetaminophen (NORCO/VICODIN) 5-325 MG per tablet Take 1-2 tablets by mouth every 6 (six) hours as  needed.  30 tablet  0   No current facility-administered medications on file prior to visit.    Review of Systems  Constitutional: Positive for activity change, appetite change and fatigue. Negative for fever, chills and diaphoresis.  HENT: Positive for hearing loss, neck pain, neck stiffness and tinnitus. Negative for ear pain, nosebleeds, congestion, facial swelling, rhinorrhea, sneezing, postnasal drip, sinus pressure and ear discharge.   Eyes: Negative.   Respiratory: Positive for shortness of breath. Negative for cough, choking and wheezing.   Cardiovascular: Positive for leg swelling. Negative for chest pain and palpitations.  Gastrointestinal: Positive for constipation. Negative for nausea, vomiting, abdominal pain, diarrhea, blood in stool, abdominal distention, anal bleeding and rectal pain.  Endocrine: Negative for cold intolerance, heat intolerance, polydipsia, polyphagia and polyuria.  Genitourinary: Positive for urgency, frequency and flank pain. Negative for hematuria, genital sores and pelvic pain.  Musculoskeletal: Positive for myalgias, back pain, arthralgias and gait problem. Negative for joint swelling.  Skin: Positive for pallor.  Allergic/Immunologic: Negative.   Neurological: Positive for weakness and headaches. Negative for dizziness, tremors, seizures, syncope, speech difficulty, light-headedness and numbness.  Hematological: Negative.   Psychiatric/Behavioral: Positive for sleep disturbance. Negative for suicidal ideas, hallucinations, behavioral problems, confusion, self-injury and dysphoric mood. The  patient is nervous/anxious. The patient is not hyperactive.        Short-term memory loss.       Objective:BP 120/70  Pulse 74  Temp(Src) 98.2 F (36.8 C) (Oral)  Resp 16  Ht 5\' 1"  (1.549 m)  Wt 157 lb (71.215 kg)  BMI 29.68 kg/m2    Physical Exam  Constitutional: She is oriented to person, place, and time. No distress.  Frail elderly female  HENT:  Head: Normocephalic and atraumatic.  Right Ear: External ear normal.  Left Ear: External ear normal.  Nose: Nose normal.  Mouth/Throat: No oropharyngeal exudate.  Partial loss of hearing bilaterally  Eyes: Conjunctivae and EOM are normal. Pupils are equal, round, and reactive to light.  Status post bilateral cataract extractions with intraocular lens implants.  Neck: Normal range of motion. Neck supple. No JVD present. No tracheal deviation present. No thyromegaly present.  Cardiovascular: Normal rate, regular rhythm, normal heart sounds and intact distal pulses.  Exam reveals no gallop and no friction rub.   No murmur heard. Pulmonary/Chest: Effort normal and breath sounds normal. No respiratory distress. She has no wheezes. She has no rales. She exhibits no tenderness.  Abdominal: Soft. Bowel sounds are normal. She exhibits no distension and no mass. There is no tenderness.  Musculoskeletal: Normal range of motion. She exhibits no edema and no tenderness.  Unstable gait  Lymphadenopathy:    She has no cervical adenopathy.  Neurological: She is alert and oriented to person, place, and time. No cranial nerve deficit.  Skin: Skin is warm and dry. No rash noted. No erythema. No pallor.  Psychiatric: She has a normal mood and affect. Her behavior is normal. Judgment and thought content normal.    Office Visit on 10/24/2012  Component Date Value Range Status  . Cholesterol, Total 10/24/2012 103  100 - 199 mg/dL Final  . Triglycerides 10/24/2012 37  0 - 149 mg/dL Final  . HDL 40/98/1191 57  >39 mg/dL Final   Comment: According to  ATP-III Guidelines, HDL-C >59 mg/dL is considered a                          negative risk factor for CHD.  Marland Kitchen  VLDL Cholesterol Cal 10/24/2012 7  5 - 40 mg/dL Final  . LDL Calculated 10/24/2012 39  0 - 99 mg/dL Final  . Chol/HDL Ratio 10/24/2012 1.8  0.0 - 4.4 ratio units Final  . Glucose 10/24/2012 159* 65 - 99 mg/dL Final  . BUN 78/29/5621 23  8 - 27 mg/dL Final  . Creatinine, Ser 10/24/2012 1.03* 0.57 - 1.00 mg/dL Final  . GFR calc non Af Amer 10/24/2012 50* >59 mL/min/1.73 Final  . GFR calc Af Amer 10/24/2012 57* >59 mL/min/1.73 Final  . BUN/Creatinine Ratio 10/24/2012 22  11 - 26 Final  . Sodium 10/24/2012 140  134 - 144 mmol/L Final  . Potassium 10/24/2012 4.9  3.5 - 5.2 mmol/L Final  . Chloride 10/24/2012 97  97 - 108 mmol/L Final  . CO2 10/24/2012 27  18 - 29 mmol/L Final  . Calcium 10/24/2012 9.6  8.6 - 10.2 mg/dL Final  . Total Protein 10/24/2012 6.6  6.0 - 8.5 g/dL Final  . Albumin 30/86/5784 4.0  3.5 - 4.7 g/dL Final  . Globulin, Total 10/24/2012 2.6  1.5 - 4.5 g/dL Final  . Albumin/Globulin Ratio 10/24/2012 1.5  1.1 - 2.5 Final  . Total Bilirubin 10/24/2012 0.3  0.0 - 1.2 mg/dL Final  . Alkaline Phosphatase 10/24/2012 113  39 - 117 IU/L Final  . AST 10/24/2012 26  0 - 40 IU/L Final  . ALT 10/24/2012 15  0 - 32 IU/L Final  Admission on 08/02/2012, Discharged on 08/06/2012  No results displayed because visit has over 200 results.           Assessment & Plan:  Diabetes mellitus: under reasonable control  HTN (hypertension): Controlled  Polymyalgia rheumatica: Diffuse myalgias persist. She remains on low dose of prednisone, which has been helpful.  Depression: Chronic. Unchanged.  Renal oncocytoma, unspecified laterality: Continues to be followed by Dr. Annabell Howells  Anemia: Followup at next visit.  Other and unspecified hyperlipidemia: Controlled  Sinus congestion - Plan: cetirizine-pseudoephedrine (ZYRTEC-D) 5-120 MG per tablet  Abnormality of gait: Continue to  follow. I suspect this is related to arthritis as opposed to a neurologic issue  TIA (transient ischemic attack): Brief incident of speech problems 2 days prior to this visit. Speech is normal today. Gait seems more unstable to the patient and her son.

## 2012-10-25 LAB — LIPID PANEL
Cholesterol, Total: 103 mg/dL (ref 100–199)
VLDL Cholesterol Cal: 7 mg/dL (ref 5–40)

## 2012-10-25 LAB — COMPREHENSIVE METABOLIC PANEL
Albumin/Globulin Ratio: 1.5 (ref 1.1–2.5)
Albumin: 4 g/dL (ref 3.5–4.7)
Alkaline Phosphatase: 113 IU/L (ref 39–117)
BUN/Creatinine Ratio: 22 (ref 11–26)
BUN: 23 mg/dL (ref 8–27)
Chloride: 97 mmol/L (ref 97–108)
GFR calc Af Amer: 57 mL/min/{1.73_m2} — ABNORMAL LOW (ref >59)
GFR calc non Af Amer: 50 mL/min/{1.73_m2} — ABNORMAL LOW (ref >59)
Globulin, Total: 2.6 g/dL (ref 1.5–4.5)
Total Bilirubin: 0.3 mg/dL (ref 0.0–1.2)

## 2012-10-31 ENCOUNTER — Other Ambulatory Visit: Payer: Self-pay | Admitting: Internal Medicine

## 2012-11-01 ENCOUNTER — Other Ambulatory Visit: Payer: Self-pay | Admitting: *Deleted

## 2012-11-01 MED ORDER — PREDNISONE 1 MG PO TABS: 3.0000 mg | ORAL_TABLET | Freq: Every morning | ORAL | Status: DC

## 2012-11-03 ENCOUNTER — Other Ambulatory Visit: Payer: Self-pay | Admitting: Internal Medicine

## 2012-11-04 ENCOUNTER — Other Ambulatory Visit (HOSPITAL_COMMUNITY): Payer: Self-pay | Admitting: Internal Medicine

## 2012-11-06 NOTE — Telephone Encounter (Signed)
Spoke with patient, patient confirmed that she is not taking this medication and she did not request medication from the pharmacy. Message will be sent back to the pharmacy

## 2012-11-15 ENCOUNTER — Ambulatory Visit (HOSPITAL_COMMUNITY)
Admission: RE | Admit: 2012-11-15 | Discharge: 2012-11-15 | Disposition: A | Discharge status: Home / Self Care | Payer: Medicare PPO | Source: Ambulatory Visit | Attending: Interventional Radiology | Admitting: Interventional Radiology

## 2012-11-15 ENCOUNTER — Encounter (HOSPITAL_COMMUNITY): Payer: Self-pay

## 2012-11-15 ENCOUNTER — Other Ambulatory Visit: Payer: Medicare PPO

## 2012-11-15 ENCOUNTER — Ambulatory Visit
Admission: RE | Admit: 2012-11-15 | Discharge: 2012-11-15 | Disposition: A | Discharge status: Home / Self Care | Payer: Medicare PPO | Source: Ambulatory Visit | Attending: Interventional Radiology | Admitting: Interventional Radiology

## 2012-11-15 DIAGNOSIS — K5730 Diverticulosis of large intestine without perforation or abscess without bleeding: Secondary | ICD-10-CM | POA: Insufficient documentation

## 2012-11-15 DIAGNOSIS — K7689 Other specified diseases of liver: Secondary | ICD-10-CM | POA: Insufficient documentation

## 2012-11-15 DIAGNOSIS — M5137 Other intervertebral disc degeneration, lumbosacral region: Secondary | ICD-10-CM | POA: Insufficient documentation

## 2012-11-15 DIAGNOSIS — D3000 Benign neoplasm of unspecified kidney: Secondary | ICD-10-CM | POA: Insufficient documentation

## 2012-11-15 DIAGNOSIS — M8448XA Pathological fracture, other site, initial encounter for fracture: Secondary | ICD-10-CM | POA: Insufficient documentation

## 2012-11-15 DIAGNOSIS — M51379 Other intervertebral disc degeneration, lumbosacral region without mention of lumbar back pain or lower extremity pain: Secondary | ICD-10-CM | POA: Insufficient documentation

## 2012-11-15 DIAGNOSIS — I2510 Atherosclerotic heart disease of native coronary artery without angina pectoris: Secondary | ICD-10-CM | POA: Insufficient documentation

## 2012-11-15 DIAGNOSIS — D3001 Benign neoplasm of right kidney: Secondary | ICD-10-CM

## 2012-11-15 DIAGNOSIS — R161 Splenomegaly, not elsewhere classified: Secondary | ICD-10-CM | POA: Insufficient documentation

## 2012-11-15 DIAGNOSIS — K8020 Calculus of gallbladder without cholecystitis without obstruction: Secondary | ICD-10-CM | POA: Insufficient documentation

## 2012-11-15 MED ORDER — IOHEXOL 300 MG/ML  SOLN
100.0000 mL | Freq: Once | INTRAMUSCULAR | Status: AC | PRN
  Administered 2012-11-15: 100 mL via INTRAVENOUS

## 2012-11-20 NOTE — Progress Notes (Signed)
Patient ID: [name], female   DOB: 1925/01/05, 79 y.o.   MRN: [id number]  ESTABLISHED PATIENT OFFICE VISIT  Chief Complaint: Status post percutaneous cryoablation of a right renal oncocytoma on 03/16/2012.  History: [name] returns for follow-up. Right renal cryoablation was complicated by a posterior perinephric hematoma which than became infected, requiring percutaneous drainage on 06/10/2012. After resolution of the abscess, the patient also underwent right-sided ureteral stent placement by Dr. Annabell Howells on 07/10/2012 due to urine leak. The stent has been removed in the interval. The patient is also status post interval right hip arthroplasty by [name] for right femoral neck fracture on 08/03/2012. The patient has been feeling well recently with some mild periodic right flank pain. She did start antibiotics yesterday for a urinary tract infection.  Review of Systems: No fever or chills. No hematuria. No nausea or vomiting.  Exam: Vital signs: Blood pressure 124/83, pulse 99, respirations 18, temperature 98, oxygen saturation 92% on room air. General: No acute distress. Abdomen: Soft and nontender. No flank tenderness elicited.  Labs: BUN 23, creatinine 1.03 and estimated GFR 57 ml/minute.  Imaging: Follow-up CT was performed earlier today. This demonstrates ablation defect of the posterior right kidney without evidence of enhancement. No recurrent abscess or hematoma is identified.  Assessment and Plan: Resolution of right perinephric abscess after cryoablation. This was treated with percutaneous drainage as well as right ureteral stent placement for urine leak. Imaging shows no evidence of recurrent tumor. Given appearance of the CT, I have recommended follow-up imaging in 12 months.

## 2012-12-03 ENCOUNTER — Other Ambulatory Visit: Payer: Self-pay | Admitting: Internal Medicine

## 2012-12-19 ENCOUNTER — Encounter: Payer: Self-pay | Admitting: Internal Medicine

## 2012-12-19 DIAGNOSIS — N183 Chronic kidney disease, stage 3 (moderate): Secondary | ICD-10-CM

## 2013-01-10 ENCOUNTER — Other Ambulatory Visit: Payer: Self-pay

## 2013-01-11 ENCOUNTER — Other Ambulatory Visit: Payer: Medicare HMO

## 2013-01-14 ENCOUNTER — Encounter: Payer: Self-pay | Admitting: Internal Medicine

## 2013-01-15 ENCOUNTER — Ambulatory Visit (INDEPENDENT_AMBULATORY_CARE_PROVIDER_SITE_OTHER): Payer: Medicare HMO | Admitting: Internal Medicine

## 2013-01-15 ENCOUNTER — Encounter: Payer: Self-pay | Admitting: Internal Medicine

## 2013-01-15 VITALS — BP 120/78 | HR 66 | Temp 99.3°F | Wt 151.0 lb

## 2013-01-15 DIAGNOSIS — D693 Immune thrombocytopenic purpura: Secondary | ICD-10-CM

## 2013-01-15 DIAGNOSIS — N183 Chronic kidney disease, stage 3 unspecified: Secondary | ICD-10-CM

## 2013-01-15 DIAGNOSIS — R0989 Other specified symptoms and signs involving the circulatory and respiratory systems: Secondary | ICD-10-CM

## 2013-01-15 DIAGNOSIS — I5032 Chronic diastolic (congestive) heart failure: Secondary | ICD-10-CM

## 2013-01-15 DIAGNOSIS — E1129 Type 2 diabetes mellitus with other diabetic kidney complication: Secondary | ICD-10-CM

## 2013-01-15 DIAGNOSIS — D6959 Other secondary thrombocytopenia: Secondary | ICD-10-CM

## 2013-01-15 DIAGNOSIS — I509 Heart failure, unspecified: Secondary | ICD-10-CM

## 2013-01-15 DIAGNOSIS — E785 Hyperlipidemia, unspecified: Secondary | ICD-10-CM

## 2013-01-15 DIAGNOSIS — I10 Essential (primary) hypertension: Secondary | ICD-10-CM

## 2013-01-15 DIAGNOSIS — R269 Unspecified abnormalities of gait and mobility: Secondary | ICD-10-CM

## 2013-01-15 DIAGNOSIS — E1165 Type 2 diabetes mellitus with hyperglycemia: Secondary | ICD-10-CM

## 2013-01-15 DIAGNOSIS — D649 Anemia, unspecified: Secondary | ICD-10-CM

## 2013-01-15 DIAGNOSIS — R1310 Dysphagia, unspecified: Secondary | ICD-10-CM

## 2013-01-15 MED ORDER — INSULIN PEN NEEDLE 31G X 8 MM MISC: Status: DC

## 2013-01-15 MED ORDER — INFLUENZA VAC SPLIT HIGH-DOSE IM SUSP: 0.5000 mL | Freq: Once | INTRAMUSCULAR | Status: DC

## 2013-01-15 NOTE — Patient Instructions (Signed)
Continue current medications. 

## 2013-01-15 NOTE — Progress Notes (Signed)
Patient ID: [name], female   DOB: 1924-08-08, 79 y.o.   MRN: [id number] Nursing Home Location:      Place of Service:  [facility], [facility], [facility] office  PCP: [name], MD  Code Status: Living Will   Allergies  Allergen Reactions  . Allegra [Fexofenadine Hcl]   . Levaquin [Levofloxacin In D5w]   . Tussionex Pennkinetic Er [Hydrocod Polst-Cpm Polst Er]   . Codeine Nausea And Vomiting  . Morphine And Related Nausea And Vomiting    Chief Complaint  Patient presents with  . Medical Managment of Chronic Issues    3 month follow-up  . Pneumonia    Patient was recently diagnoised with pneumonia and questions if ok to get flu vaccine     HPI:  Went Urgent Care on [address] diagnosed pneumonia 2 weeks ago and treated her with Levaquin x 7 days. Little cough. Feels better. Did not run fever.  Complains of arthritis in hands, wrists and right knee. Has used Aspercreme and tramadol.  Abnormality of gait: fall risk. Using walker  Chronic diastolic congestive heart failure: compensated  Chronic kidney disease, stage III (moderate) : stable  HTN (hypertension) : controlled  Hyperlipidemia -:controlled  Dysphagia, unspecified: rare choking  Type II or unspecified type diabetes mellitus with renal manifestations, uncontrolled(250.42): controlled  Immune thrombocytopenia: needs recheck  Rales: Velcro rales most compatible with PIF  Anemia:  Needs followup    Past Medical History  Diagnosis Date  . HTN (hypertension)   . Osteoporosis   . 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 COPD (chronic obstructive pulmonary disease)     no inhalers used   . PONV (postoperative nausea and vomiting)     in past, none recently  . History of TIA (transient ischemic attack) 2012--  NO RESIDUAL  . CHF (congestive heart failure)   . Arthritis     BACK , NECK, AND JOINTS  . Temporal arteritis   . Chronic steroid use   . Renal oncocytoma RIGHT SIDE--  S/P  CYROALBATION JAN 2014  POST-OP  ABSCESS  . Macular degeneration of both eyes   . Diabetic peripheral neuropathy   . Insulin dependent diabetes mellitus   . Chronic cystitis   . Hemoperitoneum (nontraumatic)   . Malignant neoplasm of renal pelvis   . Urinary frequency   . Unspecified late effects of cerebrovascular disease   . Abnormality of gait   . Osteoporosis, unspecified   . Anxiety state, unspecified   . Herpes zoster with other nervous system complications(053.19)   . Anemia, unspecified   . Type I (juvenile type) diabetes mellitus without mention of complication, uncontrolled   . Other and unspecified hyperlipidemia   . Personality change due to conditions classified elsewhere   . Unspecified essential hypertension   . Dermatophytosis of nail   . Depressive disorder, not elsewhere classified   . Diaphragmatic hernia without mention of obstruction or gangrene     Past Surgical History  Procedure Laterality Date  . Lumbar laminectomy  11-19-2003    L3 -- L4  . Total hip arthroplasty Left 01-22-2005  . Total knee arthroplasty Bilateral LEFT 08-08-2007;  RIGHT 05-08-2009  . Anterior cervical decomp/discectomy fusion  10-12-2005    C5 -- C7  . Posterior fusion cervical spine  02-10-2006    C5 -- C7  . Orif w/ plate right distal radius fx  06-18-2003  . Cataract extraction w/ intraocular lens  implant, bilateral Right Oct 1992  [facility]  . Cryoablation right renal mass  03-16-2012    ONCOCYTOMA (POST OP 06-09-2012 PERCUTANEOUS DRAIN PLACEMENT FOR PERINEPHRIC ABSCESS)  . Temporal artery biopsy / ligation    . Elbow surgery Bilateral 2003  (APPROX)  . Carpal tunnel release Right 2009  (APPROX)  . Cystoscopy w/ ureteral stent placement Right 07/10/2012    Procedure: CYSTOSCOPY WITH RIGHT RETROGRADE PYELOGRAM/URETERAL STENT PLACEMENT;  Surgeon: Anner Crete, MD;  Location: [facility];  Service: Urology;  Laterality: Right;  . Hip arthroplasty Right 08/03/2012     Procedure: CEMENTED MONOPOLAR HIP;  Surgeon: Eldred Manges, MD;  Location: [facility];  Service: Orthopedics;  Laterality: Right;  . Cataract extraction w/ intraocular lens implant Left 1993    [place]    CONSULTANTS [name]- Orthopedic Dr. Phylliss Bob- Rheumatology  Dr. Emmit Pomfret- Ophthalmology  Dr. Gerlene Fee- Neurosurgeon  Dr. Madilyn Fireman- Vascular Surgery  Dr. Mardene Speak Surg Dr. Randell Patient- GYN  Dr. Lovell Sheehan-  Neurosurg  PAST PROCEDURES 1996 UGI  11/07/1990 Colonoscopy Dr. Madilyn Fireman Internal Hemorrhoids  1992 MRI Cervical Spine : DDD, facet arthritis, compromised C5-6 foramen 06/1995 CT CS: Degenerative hypertrophic facet arthritis, DDD. compromised foramen C5-6, 6-7. 1991 Holter : nonsustained SVT,  PAC, PVC  1991 ETT Normal  11/1995 EGD  08/1995 Colonoscopy  08/1995 CT of the Abdomen  12/1995 US of the abdomen Normal  03/30/1997 Electrocardiogram  05/14/1997 Mammogram  08/11/1997 Chest X-Ray  04/14/1998 Bone Density : OP of hips 10/27/1998 Mammogram  08/15/2000 X-Ray of the pelvis  08/22/2000 Mammogram  03/08/2002 Mammogram  03/08/2002 cat Scan of the Head  05/22/2002 EMG/Nerve/ Conduction : mild sensory and motor polyneuropathy likely due to DM 04/08/2003 Mammogram  11/06/2003 Cervical Spine  11/06/2003 Chest X-Ray  08/12/2004 Bone Density  09/20/2004 X-Ray of the Lumbar Spine  01/26/2005 Chest X-Ray  06/14/2005 Mammogram  06/21/2006 Chest X-Ray  06/21/2006 CT of the Head  06/22/2006 X-Ray of the Left Knee  08/02/2007-Chest X-Ray: Borderline cardiomegaly.Calcified unfolded aorta. No active disease. 08/08/2007-Left Knee X-Ray: Status post left total knee arthroplasty without radiographic evidence for complication. Moderate sized joint effusion, as expected.  10/12/2008-CT Scan Spine: Moderately severe compression fracture of T12 appears chronic. There is posterior osteophyte at T12-L1 causing spinal stenosis.  10/12/2008-Chest X-Ray: Mild left lower lobe scarring 07/15/2008-2D Echo 01/24/2009-Venous  Doppler:No evidence od deep vein thrombosis involving the right lower extremity.   Incidental findings are consistent with Mass in right popliteal fossa with mixed echoes and arterial flow note at perimeter.  Other specific details can be found in the table above.  02/12/2009-MRI Right Knee: Advanced tricompartmental degenerative change. Intact ligamentous structures and no acute bony findings. Extensive and complex tear involving most of the lateral meniscus. Intact medial meniscus. moderate sized joint effusion with an extensive synovial process likely pigmented villonodular synovitis. this also involves a moderate sized Baker's cyst and a fluid collection tracking along the popliteus tendon. 05/06/2009-Right Knee X-Ray 11/11/10 CT head: Atrophy. SVD.Ischemic charge. No acute intracranial abnormality. Left maxillary Sinus disease 12/26/2010 Chest x-ray no acute disease 12/26/2010 CT Chest no evidence of pulmonary embolism; moderate cirrhosis, soft tissue fullness in the upper pole right kidney. Suspect a complex underlying lesion (either complex cyst or solid neoplasm) If the patient is a surgical or treatment candidate, non emergent outpatient pre and post contrast abdominal CT or MRI recommended. Cardiomegaly and coronary artery atherosclerosis. A left tenth rib fracture, non acute. Question subacute. cholelithiasis. 12/26/2010-CT Chest: No evidence of pulmonary embolism. Moderate cirrhosis. Soft tissue fullness in the upper pole  right kidney. Suspect a complex underlying lesion (either complex cyst or solid neoplasm.) if the patient is a surgical or treatment candidate, non emergent outpatient pre and post contrast abdominal CT or MRI recommended. Cardiomegaly and coronary artery atherosclerosis. A left tenth rib fracture, nonacute. Question subacute. Cholelithiasis  12/26/2010-Chest X-Ray: No acute disease. Stable compared to prior exam.   Social History: History   Social History  . Marital Status:  Widowed    Spouse Name: N/A    Number of Children: N/A  . Years of Education: N/A   Social History Main Topics  . Smoking status: Former Smoker -- 2.00 packs/day for 30 years    Types: Cigarettes    Quit date: 01/11/1983  . Smokeless tobacco: Never Used  . Alcohol Use: No  . Drug Use: No  . Sexual Activity: None   Other Topics Concern  . None   Social History Narrative   Limited 2002.   Lives in her apartment. Has a living will.   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             Family History Family Status  Relation Status Death Age  . Mother Deceased     Breast cancer  . Father Deceased     Lung cancer  . Brother Deceased     Multiple myeloma  . Daughter Alive     Congestive heart failure, chronic lung disease  . Sister Deceased     Rheumatoid arthritis, diabetes mellitus  . Son Alive     Coronary artery disease, ulcers, emphysema, skin cancer  . Sister Deceased   . Daughter Alive     Chronic bronchitis  . Daughter Deceased    Family History  Problem Relation Age of Onset  . Leukemia    . Cancer Mother   . Cancer Father   . Cancer Brother   . Cancer Daughter      Medications: Patient's Medications  New Prescriptions   No medications on file  Previous Medications   ACYCLOVIR (ZOVIRAX) 200 MG CAPSULE    TAKE ONE CAPSULE BY MOUTH ONCE DAILY TO  PREVENT  RELAPSE  OF  HERPES  SIMPLEX   AMITRIPTYLINE (ELAVIL) 50 MG TABLET    Take 50 mg by mouth at bedtime.   ASPIRIN EC 325 MG TABLET    Take 325 mg by mouth daily.    CETIRIZINE-PSEUDOEPHEDRINE (ZYRTEC-D) 5-120 MG PER TABLET    Take 1 tablet by mouth 2 (two) times daily. To help sinus congestion   FUROSEMIDE (LASIX) 20 MG TABLET    Take 20 mg by mouth daily.   GABAPENTIN (NEURONTIN) 300 MG CAPSULE    TAKE TWO CAPSULES BY MOUTH THREE TIMES DAILY TO HELP CONTROL PAIN   INSULIN ASPART (NOVOLOG) 100 UNIT/ML INJECTION    Inject 0-15 Units into the skin as needed.  Sugar level less than 150 do not take any Novolog, if sugar level is between 151-200 take 3 units, 201-250 take 5 units, 251-300 take 7 units 301-350 take 9 units, 351-400 take 11 units, sugar level over 400 take 15 units and notify docotor   INSULIN DETEMIR (LEVEMIR) 100 UNIT/ML INJECTION    Inject 30 Units into the skin at bedtime.   MULTIPLE VITAMIN (MULTIVITAMIN WITH MINERALS) TABS    Take 1 tablet by mouth daily.   OXYBUTYNIN (DITROPAN) 5 MG TABLET    TAKE 1 TABLE ORALLY UP TO 3 TIMES A DAY TO HELP BLADDER  CONTROL   PREDNISONE (DELTASONE) 1 MG TABLET    Take 3 tablets (3 mg total) by mouth every morning.   TRAMADOL (ULTRAM) 50 MG TABLET    Take 50 mg by mouth every 8 (eight) hours as needed for pain.  Modified Medications   No medications on file  Discontinued Medications   HYDROCODONE-ACETAMINOPHEN (NORCO/VICODIN) 5-325 MG PER TABLET    Take 1-2 tablets by mouth every 6 (six) hours as needed.    Immunization History  Administered Date(s) Administered  . Influenza-Generic 12/24/2008, 11/10/2010, 12/14/2011  . Pneumococcal Conjugate 03/09/2011  . Tdap 03/09/2011     Review of Systems  Constitutional: Negative for fever, chills and malaise/fatigue.  Eyes: Negative for blurred vision.  Respiratory: Negative for cough and shortness of breath.        Recently diagnosed with pneumonia at [facility]. Treated as outpatient.  Cardiovascular: Negative for chest pain and palpitations.  Gastrointestinal: Negative for abdominal pain, diarrhea and constipation.  Genitourinary:       Has stents connected to leg bags for all urine- blood tinged and that is why she she is going back to urologist today  Musculoskeletal: Positive for joint pain (shoulder pain and hands).  Neurological: Negative for weakness.  Psychiatric/Behavioral: Positive for memory loss. Negative for depression. The patient is not nervous/anxious and does not have insomnia.      Filed Vitals:   01/15/13 1514  BP: 120/78   Pulse: 66  Temp: 99.3 F (37.4 C)  TempSrc: Oral  Weight: 151 lb (68.493 kg)  SpO2: 96%   Physical Exam  Constitutional: She is oriented to person, place, and time. No distress.  Frail elderly female  HENT:  Head: Normocephalic and atraumatic.  Right Ear: External ear normal.  Left Ear: External ear normal.  Nose: Nose normal.  Mouth/Throat: No oropharyngeal exudate.  Partial loss of hearing bilaterally  Eyes: Conjunctivae and EOM are normal. Pupils are equal, round, and reactive to light.  Status post bilateral cataract extractions with intraocular lens implants.  Neck: Normal range of motion. Neck supple. No JVD present. No tracheal deviation present. No thyromegaly present.  Cardiovascular: Normal rate, regular rhythm, normal heart sounds and intact distal pulses.  Exam reveals no gallop and no friction rub.   No murmur heard. Pulmonary/Chest: Effort normal. No respiratory distress. She has no wheezes. She has rales (Velcro rales bilterally and diffuse). She exhibits no tenderness.  Abdominal: Soft. Bowel sounds are normal. She exhibits no distension and no mass. There is no tenderness.  Musculoskeletal: Normal range of motion. She exhibits no edema and no tenderness.  Unstable gait  Lymphadenopathy:    She has no cervical adenopathy.  Neurological: She is alert and oriented to person, place, and time. No cranial nerve deficit.  Skin: Skin is warm and dry. No rash noted. No erythema. No pallor.  Psychiatric: She has a normal mood and affect. Her behavior is normal. Judgment and thought content normal.      Labs reviewed: CBC: @LAB1YEARLOOKBACK (WBC:3, HGB:3, MCV:3, PLATELET:3) Basic Metabolic Panel: ) Recent Labs  08/05/12 0525 08/06/12 0420 10/24/12 1739  NA 132* 135 140  K 5.2* 3.7 4.9  CL 97 99 97  CO2 22 29 27   GLUCOSE 233* 101* 159*  BUN 18 21 23   CREATININE 1.05 0.72 1.03*  CALCIUM 8.6 8.5 9.6   Liver Function Tests:  Recent Labs  05/17/12 1633  05/30/12 1805 08/02/12 1604 10/24/12 1739  AST 27 20 25 26   ALT 18 17 26  15  ALKPHOS 91 121* 90 113  BILITOT 0.6 0.4 0.4 0.3  PROT 7.1 7.0 5.9* 6.6  ALBUMIN 3.1* 3.9 2.9*  --     Recent Labs  03/20/12 1008  LIPASE 10*   No results found for this basename: AMMONIA,  in the last 8760 hours CBC:  Recent Labs  07/10/12 0816 07/24/12 1457  08/04/12 0330 08/05/12 0525 08/06/12 0420  WBC  --  9.9  < > 7.3 9.0 6.5  NEUTROABS  --  7.2*  --   --   --   --   HGB 11.9* 11.8  < > 9.6* 10.4* 8.4*  HCT 35.0* 35.5  < > 30.2* 32.2* 25.4*  MCV  --  93  < > 92.9 94.2 92.4  PLT  --   --   < > PLATELET CLUMPS NOTED ON SMEAR, COUNT APPEARS ADEQUATE 165 113*  < > = values in this interval not displayed. Cardiac Enzymes:  Recent Labs  06/06/12 2153  CKTOTAL 38   BNP: No components found with this basename: POCBNP,  CBG:  Recent Labs  08/05/12 2319 08/06/12 0737 08/06/12 1157  GLUCAP 110* 72 91   TSH:  Recent Labs  05/30/12 1805  TSH 0.979   A1C: Lab Results  Component Value Date   HGBA1C 7.0* 07/24/2012   Lipid Panel:  Recent Labs  10/24/12 1739  HDL 57  LDLCALC 39  TRIG 37  CHOLHDL 1.8    Radiological Exams:   05/17/2012  Dg Chest 2 View Clinical Data: Fall with left hip pain and cough.  CHEST - 2 VIEW  Comparison: 05/14/2012.  Findings: Trachea is midline.  Heart size stable.  Descending thoracic aorta is tortuous.  Scarring or atelectasis at the left lung base.  Lungs are otherwise clear.  No pleural fluid.  Upper lumbar compression fracture and kyphosis are unchanged from 03/20/2012.  There appears to be a mid thoracic compression fracture as well, which appears new.  IMPRESSION:  1.  Left basilar atelectasis or scarring. 2.  Mid thoracic compression fracture appears new from 03/20/2012.   Original Report Authenticated By: Leanna Battles, M.D.      05/15/2012  Dg Ribs Unilateral W/chest Left  Clinical Data: Larey Seat last night with pain  LEFT RIBS AND CHEST - 3+  VIEW  Comparison: Portable chest x-ray of 03/21/2012  Findings: There is mild linear atelectasis or scarring at the lung bases.  No pneumothorax is seen.  There is cardiomegaly present.  The left rib detail films do show an old fracture of the lateral left tenth rib but no acute rib fracture is seen with certainty.  IMPRESSION:  1.  Basilar linear atelectasis left greater than right.  No pneumothorax. 2.  No definite acute left rib fracture.   Original Report Authenticated By: Dwyane Dee, M.D.      05/17/2012  Dg Hip Complete Left  Clinical Data: Fall with left hip pain.  LEFT HIP - COMPLETE 2+ VIEW  Comparison: 05/14/2012.  Findings: Left hip arthroplasty without complicating feature. Minimal degenerative change in the right hip.  Obturator rings are intact.  IMPRESSION: Left hip arthroplasty.  No acute findings.   Original Report Authenticated By: Leanna Battles, M.D.      05/15/2012  Dg Hip Complete Left  Clinical Data: History of injury from fall.  History of pain.  LEFT HIP - COMPLETE 2+ VIEW  Comparison: CT 03/20/2012.  Findings: Previous left hip arthroplasty has been performed. Hardware is in place without evidence of disruption.  No fracture dislocation is seen.  Pelvic phleboliths are present.  There is slightly osteopenic appearance of bones.  There is narrowing of the right hip joint space with minimal spurring.  No calcific bursitis is seen.  IMPRESSION: Post left hip arthroplasty.  No disruption of hardware is evident. No fracture or dislocation.  Osteopenic appearance of bones.   Original Report Authenticated By: Onalee Hua Call      05/17/2012  Ct Head Wo Contrast  Clinical Data: Fall, weakness.  CT HEAD WITHOUT CONTRAST  Technique:  Contiguous axial images were obtained from the base of the skull through the vertex without contrast.  Comparison: 03/23/2012  Findings: Mild age related volume loss. No acute intracranial abnormality.  Specifically, no hemorrhage, hydrocephalus, mass lesion, acute  infarction, or significant intracranial injury.  No acute calvarial abnormality.  Opacification of the left maxillary sinus is stable.  Orbital soft tissues unremarkable.  IMPRESSION: No acute intracranial abnormality.   Original Report Authenticated By: Charlett Nose, M.D.     06/10/2012  *RADIOLOGY REPORT*  Clinical data:  Right perinephric abscess post renal cryoablation.  CT-GUIDED RETROPERITONEAL ABSCESS DRAINAGE CATHETER PLACEMENT  Technique and findings: The procedure, risks (including but not limited to bleeding, infection, organ damage), benefits, and alternatives were explained to the patient.  Questions regarding the procedure were encouraged and answered.  The patient understands and consents to the procedure.The patient was placed prone on the CT gantry and limited   axial scans through the mid abdomen obtained.  The collection was localized and an appropriate skin entry site determined. Operator donned sterile gloves and mask.   Site was marked, prepped with Betadine, draped in usual sterile fashion, infiltrated locally with 1% lidocaine.  Intravenous Fentanyl and Versed were administered as conscious sedation during continuous cardiorespiratory monitoring by the radiology RN, with a total moderate sedation time of less than 30 minutes.  Under CT fluoroscopic guidance, a 19 gauge percutaneous entry needle was advanced into the collection.  Purulent bloody material could be aspirated.  An Amplatz guide wire advanced easily within the collection.  Tract was dilated to allow placement 12-French pigtail catheter, formed within the dominant component of the collection.  Catheter position was confirmed on CT.  Catheter was secured externally with O-Prolene suture and Statlock, and placed to gravity drain bag.  20 ml of bloody purulent output were aspirated, a sample sent for routine Gram stain and culture. The patient tolerated the procedure well.  No immediate complication.  IMPRESSION: Technically  successful CT-guided retroperitoneal abscess drainage catheter placement.   Original Report Authenticated By: D. Andria Rhein, MD     06/08/2012  *RADIOLOGY REPORT*  Clinical Data: Status post cryoablation of a right renal oncocytoma on 03/16/2012.  Postprocedural imaging demonstrated development of postoperative retroperitoneal hemorrhage.  The patient has been treated for recent urinary tract infection and has had hypotension and weakness.  CT ABDOMEN AND PELVIS WITH CONTRAST  Technique:  Multidetector CT imaging of the abdomen and pelvis was performed following the standard protocol during bolus administration of intravenous contrast.  Contrast: OMNIPAQUE IOHEXOL 300 MG/ML  SOLN, 50mL OMNIPAQUE IOHEXOL 300 MG/ML  SOLN  Comparison: 05/09/2012 and 03/20/2012.  Findings: Posterior to the right kidney at the level of previously demonstrated retracting postprocedural hematoma, a more liquefied collection is now present which is larger than the most recent demonstrated resolving hematoma.  This area appears multiloculated and measures roughly 6 cm and greatest transverse diameter.  On reconstructions, the height of the total area of the abnormality  approaches 9 cm.  Findings are suspicious for development of an abscess at the site of prior postprocedural hemorrhage.  No air is seen in the collection.  The process abuts the kidney but does not obviously involve the renal parenchyma itself.  An ablation defect is not again identified posteriorly at the level of the treated right renal neoplasm.  There is a stable upper pole left renal cyst.  Stable calcified gallstones are identified.  No evidence of ascites.  Bowel loops are unremarkable.  No hernias are seen.  The liver, spleen, pancreas and adrenal glands are unremarkable.  IMPRESSION: Development of a larger and more liquefied collection posterior to the right kidney in the retroperitoneal space.  This is at the level of the previously demonstrated retracting  postprocedural perinephric hematoma after cryoablation of a right renal neoplasm in January.  Findings are suspicious for development of abscess/infected hematoma.  Percutaneous aspiration/drainage may be appropriate.   Original Report Authenticated By: Irish Lack, M.D   08/16/12 CT abd: 1. Post procedural changes of cryoablation in the right kidney, without findings to suggest local recurrence of disease. 2. The appearance of the liver is compatible with mild cirrhosis. Portal vein appears dilated (20 mm), suggesting portal hypertension. As well, there is mild splenomegaly. 3. Cholelithiasis without findings to suggest acute cholecystitis at this time. 4. Colonic diverticulosis. 5. Atherosclerosis, including coronary artery disease. 6. Additional findings, as above, similar prior studies   Assessment/Plan  Pneumonia. (Clinical diagnosis at urgent care): resolved  Abnormality of gait: fall risk. Using cane.  Chronic diastolic congestive heart failure: compensated  Chronic kidney disease, stage III (moderate): continue routine lab  HTN (hypertension): controlled- Plan: Comprehensive metabolic panel  Hyperlipidemia: recheck next visit - Plan: Lipid panel, CANCELED: Lipid panel  Dysphagia, unspecified:minimal symptoms  Type II or unspecified type diabetes mellitus with renal manifestations, uncontrolled(250.42): controlled   - Plan: Insulin Pen Needle (B-D ULTRAFINE III SHORT PEN) 31G X 8 MM MISC,  Comprehensive metabolic panel, Hemoglobin A1c  Immune thrombocytopenia - Plan: CBC With differential/Platelet count  Rales: bilateral dry rales  Anemia; routine follow-up   - Plan: CBC With differential/Platelet

## 2013-01-16 LAB — COMPREHENSIVE METABOLIC PANEL
ALT: 15 IU/L (ref 0–32)
Albumin/Globulin Ratio: 1.7 (ref 1.1–2.5)
Alkaline Phosphatase: 70 IU/L (ref 39–117)
BUN/Creatinine Ratio: 28 — ABNORMAL HIGH (ref 11–26)
Chloride: 103 mmol/L (ref 97–108)
GFR calc Af Amer: 62 mL/min/{1.73_m2} (ref >59)
Potassium: 4.8 mmol/L (ref 3.5–5.2)
Sodium: 142 mmol/L (ref 134–144)
Total Bilirubin: 0.4 mg/dL (ref 0.0–1.2)

## 2013-01-16 LAB — CBC WITH DIFFERENTIAL
Basophils Absolute: 0 10*3/uL (ref 0.0–0.2)
Eosinophils Absolute: 0.2 10*3/uL (ref 0.0–0.4)
HCT: 35.9 % (ref 34.0–46.6)
Lymphocytes Absolute: 2.3 10*3/uL (ref 0.7–3.1)
Lymphs: 28 %
MCH: 30.8 pg (ref 26.6–33.0)
MCHC: 33.4 g/dL (ref 31.5–35.7)
MCV: 92 fL (ref 79–97)
Monocytes Absolute: 0.5 10*3/uL (ref 0.1–0.9)
Neutrophils Absolute: 5.2 10*3/uL (ref 1.4–7.0)
RDW: 16.7 % — ABNORMAL HIGH (ref 12.3–15.4)

## 2013-01-16 LAB — HEMOGLOBIN A1C
Est. average glucose Bld gHb Est-mCnc: 160 mg/dL
Hgb A1c MFr Bld: 7.2 % — ABNORMAL HIGH (ref 4.8–5.6)

## 2013-01-16 LAB — LIPID PANEL: Cholesterol, Total: 135 mg/dL (ref 100–199)

## 2013-01-17 ENCOUNTER — Telehealth: Payer: Self-pay | Admitting: *Deleted

## 2013-01-17 ENCOUNTER — Other Ambulatory Visit: Payer: Self-pay | Admitting: Internal Medicine

## 2013-01-17 NOTE — Telephone Encounter (Signed)
Spoke with patient informed her of lab results, patient states that she understands everything.

## 2013-01-17 NOTE — Telephone Encounter (Signed)
Message copied by Lamont Snowball on Thu Jan 17, 2013 10:55 AM ------      Message from: [name]      Created: Wed Jan 16, 2013 10:50 AM       Call patient. Lab results are satisfactory.CBC normal. Glucose was 105 and A1c was7.2/ No change in medications. Continue current medications. ------

## 2013-01-22 ENCOUNTER — Other Ambulatory Visit (HOSPITAL_COMMUNITY)
Admission: RE | Admit: 2013-01-22 | Discharge: 2013-01-22 | Disposition: A | Discharge status: Home / Self Care | Payer: Medicare PPO | Source: Ambulatory Visit | Attending: Family Medicine | Admitting: Family Medicine

## 2013-01-22 ENCOUNTER — Encounter (HOSPITAL_COMMUNITY): Payer: Self-pay | Admitting: Emergency Medicine

## 2013-01-22 ENCOUNTER — Emergency Department (INDEPENDENT_AMBULATORY_CARE_PROVIDER_SITE_OTHER)
Admission: EM | Admit: 2013-01-22 | Discharge: 2013-01-22 | Disposition: A | Discharge status: Home / Self Care | Payer: Medicare PPO | Source: Home / Self Care | Attending: Family Medicine | Admitting: Family Medicine

## 2013-01-22 DIAGNOSIS — R300 Dysuria: Secondary | ICD-10-CM

## 2013-01-22 DIAGNOSIS — N760 Acute vaginitis: Secondary | ICD-10-CM | POA: Insufficient documentation

## 2013-01-22 LAB — POCT URINALYSIS DIP (DEVICE)
Bilirubin Urine: NEGATIVE
Glucose, UA: NEGATIVE mg/dL
Hgb urine dipstick: NEGATIVE
Ketones, ur: NEGATIVE mg/dL
Leukocytes, UA: NEGATIVE
Nitrite: NEGATIVE
Protein, ur: NEGATIVE mg/dL
Specific Gravity, Urine: 1.02 (ref 1.005–1.030)
Urobilinogen, UA: 0.2 mg/dL (ref 0.0–1.0)
pH: 5.5 (ref 5.0–8.0)

## 2013-01-22 MED ORDER — FLUCONAZOLE 150 MG PO TABS: 150.0000 mg | ORAL_TABLET | Freq: Once | ORAL | Status: DC

## 2013-01-22 MED ORDER — CEPHALEXIN 500 MG PO CAPS: 500.0000 mg | ORAL_CAPSULE | Freq: Two times a day (BID) | ORAL | Status: DC

## 2013-01-22 NOTE — ED Provider Notes (Signed)
[name] is a 79 y.o. female who presents to Urgent Care today for urinary frequency and pain with urination. The patient additionally notes some vaginal itching. She denies any nausea vomiting or abdominal pain. She denies any diarrhea fevers or chills. She denies any chest pains palpitations shortness of breath. Her symptoms are present for 5 days. Consistent with prior episodes of urinary tract infection. She feels well otherwise. She denies any vaginal discharge.   Past Medical History  Diagnosis Date  . HTN (hypertension)   . Osteoporosis   . 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 COPD (chronic obstructive pulmonary disease)     no inhalers used   . PONV (postoperative nausea and vomiting)     in past, none recently  . History of TIA (transient ischemic attack) 2012--  NO RESIDUAL  . CHF (congestive heart failure)   . Arthritis     BACK , NECK, AND JOINTS  . Temporal arteritis   . Chronic steroid use   . 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 Macular degeneration of both eyes   . Diabetic peripheral neuropathy   . Insulin dependent diabetes mellitus   . Chronic cystitis   . Hemoperitoneum (nontraumatic)   . Malignant neoplasm of renal pelvis   . Urinary frequency   . Unspecified late effects of cerebrovascular disease   . Abnormality of gait   . Osteoporosis, unspecified   . Anxiety state, unspecified   . Herpes zoster with other nervous system complications(053.19)   . Anemia, unspecified   . Type I (juvenile type) diabetes mellitus without mention of complication, uncontrolled   . Other and unspecified hyperlipidemia   . Personality change due to conditions classified elsewhere   . Unspecified essential hypertension   . Dermatophytosis of nail   . Depressive disorder, not elsewhere classified   . Diaphragmatic hernia without mention of obstruction or gangrene    History  Substance Use  Topics  . Smoking status: Former Smoker -- 2.00 packs/day for 30 years    Types: Cigarettes    Quit date: 01/11/1983  . Smokeless tobacco: Never Used  . Alcohol Use: No   ROS as above Medications reviewed. No current facility-administered medications for this encounter.   Current Outpatient Prescriptions  Medication Sig Dispense Refill  . ACCU-CHEK SMARTVIEW test strip       . acyclovir (ZOVIRAX) 200 MG capsule TAKE ONE CAPSULE BY MOUTH ONCE DAILY TO  PREVENT  RELAPSE  OF  HERPES  SIMPLEX  30 capsule  3  . Alcohol Swabs (ALCOHOL PADS) 70 % PADS       . amitriptyline (ELAVIL) 50 MG tablet Take 50 mg by mouth at bedtime.      Marland Kitchen aspirin EC 325 MG tablet Take 325 mg by mouth daily.       . Blood Glucose Calibration (ACCU-CHEK AVIVA) SOLN       . Blood Glucose Monitoring Suppl (ACCU-CHEK NANO SMARTVIEW) W/DEVICE KIT       . cetirizine-pseudoephedrine (ZYRTEC-D) 5-120 MG per tablet Take 1 tablet by mouth 2 (two) times daily. To help sinus congestion  30 tablet  5  . furosemide (LASIX) 20 MG tablet Take 20 mg by mouth daily.      Marland Kitchen gabapentin (NEURONTIN) 300 MG capsule TAKE TWO CAPSULES BY MOUTH THREE TIMES DAILY TO HELP CONTROL PAIN  180 capsule  0  . insulin aspart (NOVOLOG) 100 UNIT/ML injection Inject  0-15 Units into the skin as needed. Sugar level less than 150 do not take any Novolog, if sugar level is between 151-200 take 3 units, 201-250 take 5 units, 251-300 take 7 units 301-350 take 9 units, 351-400 take 11 units, sugar level over 400 take 15 units and notify docotor      . insulin detemir (LEVEMIR) 100 UNIT/ML injection Inject 30 Units into the skin at bedtime.      . Insulin Pen Needle (B-D ULTRAFINE III SHORT PEN) 31G X 8 MM MISC Use for administering insulin by pen  200 each  5  . Multiple Vitamin (MULTIVITAMIN WITH MINERALS) TABS Take 1 tablet by mouth daily.      Marland Kitchen oxybutynin (DITROPAN) 5 MG tablet TAKE 1 TABLE ORALLY UP TO 3 TIMES A DAY TO HELP BLADDER CONTROL  90 tablet  1  .  traMADol (ULTRAM) 50 MG tablet Take 50 mg by mouth every 8 (eight) hours as needed for pain.      . cephALEXin (KEFLEX) 500 MG capsule Take 1 capsule (500 mg total) by mouth 2 (two) times daily.  14 capsule  0  . fluconazole (DIFLUCAN) 150 MG tablet Take 1 tablet (150 mg total) by mouth once.  1 tablet  1  . influenza vac split trivalent high-dose (FLUZONE) injection Inject 0.5 mLs into the muscle once.  0.5 mL  0  . Lancet Devices (LITE TOUCH LANCING PEN) MISC       . LEVEMIR FLEXPEN 100 UNIT/ML SOPN INJECT 30 UNITS AT BEDTIME TO CONTROL DIABETES  15 mL  0  . levofloxacin (LEVAQUIN) 500 MG tablet       . phenazopyridine (PYRIDIUM) 100 MG tablet         Exam:  BP 131/72  Pulse 73  Temp(Src) 98.3 F (36.8 C) (Oral)  Resp 19  SpO2 100% Gen: Well NAD HEENT: EOMI,  MMM Lungs: CTABL Nl WOB Heart: RRR no MRG Abd: NABS, NT, ND Exts: Non edematous BL  LE, warm and well perfused.  GYN: Mildly erythematous appearing external genitalia. No significant discharge.   Results for orders placed during the hospital encounter of 01/22/13 (from the past 24 hour(s))  POCT URINALYSIS DIP (DEVICE)     Status: None   Collection Time    01/22/13  4:02 PM      Result Value Range   Glucose, UA NEGATIVE  NEGATIVE mg/dL   Bilirubin Urine NEGATIVE  NEGATIVE   Ketones, ur NEGATIVE  NEGATIVE mg/dL   Specific Gravity, Urine 1.020  1.005 - 1.030   Hgb urine dipstick NEGATIVE  NEGATIVE   pH 5.5  5.0 - 8.0   Protein, ur NEGATIVE  NEGATIVE mg/dL   Urobilinogen, UA 0.2  0.0 - 1.0 mg/dL   Nitrite NEGATIVE  NEGATIVE   Leukocytes, UA NEGATIVE  NEGATIVE   No results found.  Assessment and Plan: 79 y.o. female with dysuria and vaginitis.  Unclear etiology.  Urine culture is pending as is cytology for yeast and BV.  Plan empiric treatment with Keflex and fluconazole. Discussed warning signs or symptoms. Please see discharge instructions. Patient expresses understanding.      Rodolph Bong, MD 01/22/13  [phone]

## 2013-01-22 NOTE — ED Notes (Signed)
Pt reports 5 days of urinary frequency and burning without fever

## 2013-01-23 LAB — URINE CULTURE: Colony Count: NO GROWTH

## 2013-01-24 ENCOUNTER — Telehealth (HOSPITAL_COMMUNITY): Payer: Self-pay | Admitting: Family Medicine

## 2013-01-24 MED ORDER — METRONIDAZOLE 500 MG PO TABS: 500.0000 mg | ORAL_TABLET | Freq: Two times a day (BID) | ORAL | Status: DC

## 2013-01-24 NOTE — ED Notes (Signed)
Patient positive for BV. I sent in Flagyl. R.N. to call patient.  Rodolph Bong, MD 01/24/13 [phone]

## 2013-01-24 NOTE — ED Notes (Signed)
I called pt. Pt. verified x 2 and given results.  Pt. told she needs Flagyl for bacterial vaginosis and where to pick up her Rx.  Pt. instructed to no alcohol while taking this medication.  Pt. voiced understanding. Sang Blount M 01/24/2013  

## 2013-02-05 ENCOUNTER — Encounter: Payer: Self-pay | Admitting: Internal Medicine

## 2013-03-06 ENCOUNTER — Other Ambulatory Visit: Payer: Self-pay | Admitting: Internal Medicine

## 2013-03-11 ENCOUNTER — Other Ambulatory Visit: Payer: Self-pay | Admitting: Internal Medicine

## 2013-03-12 ENCOUNTER — Other Ambulatory Visit: Payer: Self-pay | Admitting: Nurse Practitioner

## 2013-05-06 ENCOUNTER — Other Ambulatory Visit: Payer: Self-pay | Admitting: Internal Medicine

## 2013-05-06 ENCOUNTER — Other Ambulatory Visit: Payer: Self-pay | Admitting: *Deleted

## 2013-05-06 MED ORDER — TRAMADOL HCL 50 MG PO TABS: ORAL_TABLET | ORAL | Status: DC

## 2013-05-07 ENCOUNTER — Encounter: Payer: Self-pay | Admitting: Internal Medicine

## 2013-05-07 ENCOUNTER — Ambulatory Visit (INDEPENDENT_AMBULATORY_CARE_PROVIDER_SITE_OTHER): Payer: Commercial Managed Care - HMO | Admitting: Internal Medicine

## 2013-05-07 VITALS — BP 138/80 | HR 91 | Temp 98.2°F | Wt 157.6 lb

## 2013-05-07 DIAGNOSIS — N390 Urinary tract infection, site not specified: Secondary | ICD-10-CM

## 2013-05-07 DIAGNOSIS — R279 Unspecified lack of coordination: Secondary | ICD-10-CM

## 2013-05-07 DIAGNOSIS — E1129 Type 2 diabetes mellitus with other diabetic kidney complication: Secondary | ICD-10-CM

## 2013-05-07 DIAGNOSIS — M6281 Muscle weakness (generalized): Secondary | ICD-10-CM

## 2013-05-07 DIAGNOSIS — E1165 Type 2 diabetes mellitus with hyperglycemia: Secondary | ICD-10-CM

## 2013-05-07 DIAGNOSIS — M1710 Unilateral primary osteoarthritis, unspecified knee: Secondary | ICD-10-CM

## 2013-05-07 DIAGNOSIS — IMO0002 Reserved for concepts with insufficient information to code with codable children: Secondary | ICD-10-CM

## 2013-05-07 DIAGNOSIS — Z9181 History of falling: Secondary | ICD-10-CM

## 2013-05-07 NOTE — Patient Instructions (Signed)
Continue medications as listed 

## 2013-05-07 NOTE — Progress Notes (Signed)
Patient ID: [name], female   DOB: 1924/05/22, 80 y.o.   MRN: [id number]    Location:    [facility]  Place of Service:  OFFICE    Allergies  Allergen Reactions  . Allegra [Fexofenadine Hcl]   . Levaquin [Levofloxacin In D5w]   . Tussionex Pennkinetic Er [Hydrocod Polst-Cpm Polst Er]   . Codeine Nausea And Vomiting  . Morphine And Related Nausea And Vomiting    Chief Complaint  Patient presents with  . Medical Managment of Chronic Issues    3 month f/u, last labs done on 01/15/13  . other    loosing balance and fell twice last week    HPI:  Went Urgent Care on [address] diagnosed pneumonia Oct 2014 and treated her with Levaquin x 7 days. Little cough. Feels better. Did not run fever.   Complains of arthritis in hands, wrists and right knee. Has used Aspercreme and tramadol.   Abnormality of gait: fall risk. Using walker. Fell 2x in late Feb 2015. No injury except mild discomfort in left hand and wrist.  Chronic diastolic congestive heart failure: compensated   Chronic kidney disease, stage III (moderate) : stable   HTN (hypertension) : controlled   Hyperlipidemia -:controlled   Dysphagia, unspecified: rare choking   Type II or unspecified type diabetes mellitus with renal manifestations, uncontrolled(250.42): controlled   Immune thrombocytopenia: resolved  Rales: Velcro rales most compatible with PIF. No cough.  Anemia: Hgb 12.0 on 01/15/13   Medications: Patient's Medications  New Prescriptions   No medications on file  Previous Medications   ACCU-CHEK SMARTVIEW TEST STRIP       ACYCLOVIR (ZOVIRAX) 200 MG CAPSULE    TAKE ONE CAPSULE BY MOUTH ONCE DAILY TO  PREVENT  RELAPSE  OF  HERPES  SIMPLEX   ALCOHOL SWABS (ALCOHOL PADS) 70 % PADS       AMITRIPTYLINE (ELAVIL) 50 MG TABLET    Take 50 mg by mouth at bedtime.   ASPIRIN EC 325 MG TABLET    Take 325 mg by mouth daily.    BLOOD GLUCOSE CALIBRATION (ACCU-CHEK AVIVA) SOLN       BLOOD GLUCOSE MONITORING  SUPPL (ACCU-CHEK NANO SMARTVIEW) W/DEVICE KIT       CEPHALEXIN (KEFLEX) 500 MG CAPSULE    Take 1 capsule (500 mg total) by mouth 2 (two) times daily.   CETIRIZINE-PSEUDOEPHEDRINE (ZYRTEC-D) 5-120 MG PER TABLET    Take 1 tablet by mouth 2 (two) times daily. To help sinus congestion   FUROSEMIDE (LASIX) 20 MG TABLET    Take 20 mg by mouth daily.   GABAPENTIN (NEURONTIN) 300 MG CAPSULE    TAKE TWO CAPSULES BY MOUTH THREE TIMES DAILY TO HELP CONTROL PAIN   INFLUENZA VAC SPLIT TRIVALENT HIGH-DOSE (FLUZONE) INJECTION    Inject 0.5 mLs into the muscle once.   INSULIN ASPART (NOVOLOG) 100 UNIT/ML INJECTION    Inject 0-15 Units into the skin as needed. Sugar level less than 150 do not take any Novolog, if sugar level is between 151-200 take 3 units, 201-250 take 5 units, 251-300 take 7 units 301-350 take 9 units, 351-400 take 11 units, sugar level over 400 take 15 units and notify docotor   INSULIN DETEMIR (LEVEMIR) 100 UNIT/ML INJECTION    Inject 30 Units into the skin at bedtime.   INSULIN PEN NEEDLE (B-D ULTRAFINE III SHORT PEN) 31G X 8 MM MISC    Use for administering insulin by pen   LANCET DEVICES (LITE TOUCH LANCING  PEN) MISC       LEVEMIR FLEXTOUCH 100 UNIT/ML PEN    INJECT 30 UNITS SUBCUTANEOUSLY AT BEDTIME TO CONTROL DIABETES   LEVOFLOXACIN (LEVAQUIN) 500 MG TABLET       LITE TOUCH LANCETS MISC       MELOXICAM (MOBIC) 7.5 MG TABLET       METRONIDAZOLE (FLAGYL) 500 MG TABLET    Take 1 tablet (500 mg total) by mouth 2 (two) times daily.   MULTIPLE VITAMIN (MULTIVITAMIN WITH MINERALS) TABS    Take 1 tablet by mouth daily.   NITROFURANTOIN, MACROCRYSTAL-MONOHYDRATE, (MACROBID) 100 MG CAPSULE       OXYBUTYNIN (DITROPAN) 5 MG TABLET    TAKE 1 TABLE ORALLY UP TO 3 TIMES A DAY TO HELP BLADDER CONTROL   PHENAZOPYRIDINE (PYRIDIUM) 100 MG TABLET       PREDNISONE (DELTASONE) 1 MG TABLET       TRAMADOL (ULTRAM) 50 MG TABLET    Take one tablet by mouth every 8 hours as needed for pain  Modified Medications     No medications on file  Discontinued Medications   FLUCONAZOLE (DIFLUCAN) 150 MG TABLET    Take 1 tablet (150 mg total) by mouth once.   FUROSEMIDE (LASIX) 40 MG TABLET    TAKE ONE TABLET BY MOUTH EVERY DAY IN THE MORNING     Review of Systems  Constitutional: Positive for activity change and fatigue. Negative for fever, chills, diaphoresis and appetite change.  HENT: Positive for hearing loss and tinnitus. Negative for congestion, ear discharge, ear pain, facial swelling, nosebleeds, postnasal drip, rhinorrhea, sinus pressure and sneezing.   Eyes: Negative.   Respiratory: Positive for shortness of breath. Negative for cough, choking and wheezing.   Cardiovascular: Positive for leg swelling. Negative for chest pain and palpitations.  Gastrointestinal: Positive for constipation. Negative for nausea, vomiting, abdominal pain, diarrhea, blood in stool, abdominal distention, anal bleeding and rectal pain.  Endocrine: Negative for cold intolerance, heat intolerance, polydipsia, polyphagia and polyuria.  Genitourinary: Positive for urgency, frequency and flank pain. Negative for hematuria, genital sores and pelvic pain.  Musculoskeletal: Positive for arthralgias, back pain, gait problem, myalgias, neck pain and neck stiffness. Negative for joint swelling.  Skin: Positive for pallor.  Allergic/Immunologic: Negative.   Neurological: Positive for weakness and headaches. Negative for dizziness, tremors, seizures, syncope, speech difficulty, light-headedness and numbness.  Hematological: Negative.   Psychiatric/Behavioral: Positive for sleep disturbance. Negative for suicidal ideas, hallucinations, behavioral problems, confusion, self-injury and dysphoric mood. The patient is nervous/anxious. The patient is not hyperactive.        Short-term memory loss.    Filed Vitals:   05/07/13 1353  BP: 138/80  Pulse: 91  Temp: 98.2 F (36.8 C)  TempSrc: Oral  Weight: 157 lb 9.6 oz (71.487 kg)  SpO2:  93%   Physical Exam  Constitutional: She is oriented to person, place, and time. No distress.  Frail elderly female  HENT:  Head: Normocephalic and atraumatic.  Right Ear: External ear normal.  Left Ear: External ear normal.  Nose: Nose normal.  Mouth/Throat: No oropharyngeal exudate.  Partial loss of hearing bilaterally  Eyes: Conjunctivae and EOM are normal. Pupils are equal, round, and reactive to light.  Status post bilateral cataract extractions with intraocular lens implants.  Neck: Normal range of motion. Neck supple. No JVD present. No tracheal deviation present. No thyromegaly present.  Cardiovascular: Normal rate, regular rhythm, normal heart sounds and intact distal pulses.  Exam reveals no gallop and no friction  rub.   No murmur heard. Pulmonary/Chest: Effort normal. No respiratory distress. She has no wheezes. She has rales (Velcro rales bilterally and diffuse). She exhibits no tenderness.  Abdominal: Soft. Bowel sounds are normal. She exhibits no distension and no mass. There is no tenderness.  Musculoskeletal: Normal range of motion. She exhibits no edema and no tenderness.  Unstable gait. Using cane and walker. Deformities of the hands and knuckles. Ulnar drift. Swollen at the MCP joints. Heberden's nodes.  Lymphadenopathy:    She has no cervical adenopathy.  Neurological: She is alert and oriented to person, place, and time. No cranial nerve deficit.  Skin: Skin is warm and dry. No rash noted. No erythema. No pallor.  Psychiatric: She has a normal mood and affect. Her behavior is normal. Judgment and thought content normal.     Labs reviewed: No visits with results within 3 Month(s) from this visit. Latest known visit with results is:  Admission on 01/22/2013, Discharged on 01/22/2013  Component Date Value Ref Range Status  . Glucose, UA 01/22/2013 NEGATIVE  NEGATIVE mg/dL Final  . Bilirubin Urine 01/22/2013 NEGATIVE  NEGATIVE Final  . Ketones, ur 01/22/2013  NEGATIVE  NEGATIVE mg/dL Final  . Specific Gravity, Urine 01/22/2013 1.020  1.005 - 1.030 Final  . Hgb urine dipstick 01/22/2013 NEGATIVE  NEGATIVE Final  . pH 01/22/2013 5.5  5.0 - 8.0 Final  . Protein, ur 01/22/2013 NEGATIVE  NEGATIVE mg/dL Final  . Urobilinogen, UA 01/22/2013 0.2  0.0 - 1.0 mg/dL Final  . Nitrite 01/22/2013 NEGATIVE  NEGATIVE Final  . Leukocytes, UA 01/22/2013 NEGATIVE  NEGATIVE Final   Biochemical Testing Only. Please order routine urinalysis from main lab if confirmatory testing is needed.  Marland Kitchen Specimen Description 01/22/2013 URINE, CLEAN CATCH   Final  . Special Requests 01/22/2013 NONE   Final  . Culture  Setup Time 01/22/2013    Final                   Value:01/22/2013 17:22                         Performed at [facility]  . Colony Count 01/22/2013    Final                   Value:NO GROWTH                         Performed at [facility]  . Culture 01/22/2013    Final                   Value:NO GROWTH                         Performed at [facility]  . Report Status 01/22/2013 01/23/2013 FINAL   Final      Assessment/Plan  1. Lack of coordination Chronic and may be getting worse  2. Muscle weakness (generalized) Generalized. Related to age and inactivity.  3. Osteoarthrosis, unspecified whether generalized or localized, lower leg unchanged  4. Personal history of fall At risk for further falls  5. Type II or unspecified type diabetes mellitus with renal manifestations, uncontrolled(250.42) controlled - Hemoglobin D2K - Basic metabolic panel - Hemoglobin A1c; Future - CMP; Future - Lipid panel; Future - Microalbumin, urine; Future  6. Recurrent UTI Needs to be rechecked - Urinalysis - Culture, Urine

## 2013-05-08 LAB — BASIC METABOLIC PANEL
BUN / CREAT RATIO: 21 (ref 11–26)
BUN: 24 mg/dL (ref 8–27)
CHLORIDE: 98 mmol/L (ref 97–108)
CO2: 26 mmol/L (ref 18–29)
Calcium: 9.4 mg/dL (ref 8.7–10.3)
Creatinine, Ser: 1.15 mg/dL — ABNORMAL HIGH (ref 0.57–1.00)
GFR calc non Af Amer: 43 mL/min/{1.73_m2} — ABNORMAL LOW (ref >59)
GFR, EST AFRICAN AMERICAN: 50 mL/min/{1.73_m2} — AB (ref >59)
GLUCOSE: 58 mg/dL — AB (ref 65–99)
POTASSIUM: 4.3 mmol/L (ref 3.5–5.2)
Sodium: 144 mmol/L (ref 134–144)

## 2013-05-08 LAB — URINALYSIS
Bilirubin, UA: NEGATIVE
Glucose, UA: NEGATIVE
KETONES UA: NEGATIVE
Leukocytes, UA: NEGATIVE
NITRITE UA: NEGATIVE
PH UA: 6.5 (ref 5.0–7.5)
Protein, UA: NEGATIVE
RBC UA: NEGATIVE
SPEC GRAV UA: 1.007 (ref 1.005–1.030)
UUROB: 0.2 mg/dL (ref 0.0–1.9)

## 2013-05-08 LAB — HEMOGLOBIN A1C
Est. average glucose Bld gHb Est-mCnc: 174 mg/dL
HEMOGLOBIN A1C: 7.7 % — AB (ref 4.8–5.6)

## 2013-05-09 LAB — URINE CULTURE

## 2013-05-23 ENCOUNTER — Other Ambulatory Visit: Payer: Self-pay | Admitting: *Deleted

## 2013-05-23 MED ORDER — MELOXICAM 7.5 MG PO TABS: ORAL_TABLET | ORAL | Status: DC

## 2013-05-23 NOTE — Telephone Encounter (Signed)
Patient Requested 

## 2013-05-29 ENCOUNTER — Other Ambulatory Visit: Payer: Self-pay | Admitting: Internal Medicine

## 2013-06-13 ENCOUNTER — Other Ambulatory Visit: Payer: Self-pay

## 2013-06-13 ENCOUNTER — Other Ambulatory Visit: Payer: Self-pay | Admitting: Nurse Practitioner

## 2013-06-13 NOTE — Telephone Encounter (Signed)
Can we refill this medication? Please advise. thanks

## 2013-06-14 MED ORDER — PREDNISONE 1 MG PO TABS: ORAL_TABLET | ORAL | Status: DC

## 2013-06-14 NOTE — Addendum Note (Signed)
Addended by: Konrad Saha on: 06/14/2013 12:14 PM   Modules accepted: Orders

## 2013-06-14 NOTE — Telephone Encounter (Signed)
rx sent to the pharmacy. 

## 2013-06-14 NOTE — Telephone Encounter (Signed)
yes

## 2013-06-24 ENCOUNTER — Other Ambulatory Visit: Payer: Self-pay | Admitting: Internal Medicine

## 2013-06-28 ENCOUNTER — Ambulatory Visit: Payer: Medicare HMO

## 2013-06-28 ENCOUNTER — Ambulatory Visit (INDEPENDENT_AMBULATORY_CARE_PROVIDER_SITE_OTHER): Payer: Medicare HMO | Admitting: Family Medicine

## 2013-06-28 VITALS — BP 114/60 | HR 98 | Temp 98.5°F | Resp 18 | Ht 61.5 in | Wt 156.0 lb

## 2013-06-28 DIAGNOSIS — IMO0001 Reserved for inherently not codable concepts without codable children: Secondary | ICD-10-CM

## 2013-06-28 DIAGNOSIS — N390 Urinary tract infection, site not specified: Secondary | ICD-10-CM

## 2013-06-28 DIAGNOSIS — M25519 Pain in unspecified shoulder: Secondary | ICD-10-CM

## 2013-06-28 DIAGNOSIS — B3741 Candidal cystitis and urethritis: Secondary | ICD-10-CM

## 2013-06-28 DIAGNOSIS — E1165 Type 2 diabetes mellitus with hyperglycemia: Secondary | ICD-10-CM

## 2013-06-28 DIAGNOSIS — M19019 Primary osteoarthritis, unspecified shoulder: Secondary | ICD-10-CM

## 2013-06-28 LAB — POCT URINALYSIS DIPSTICK
Bilirubin, UA: NEGATIVE
GLUCOSE UA: NEGATIVE
Ketones, UA: NEGATIVE
NITRITE UA: POSITIVE
RBC UA: NEGATIVE
Spec Grav, UA: 1.025
UROBILINOGEN UA: 0.2
pH, UA: 6

## 2013-06-28 LAB — POCT UA - MICROSCOPIC ONLY
CASTS, UR, LPF, POC: NEGATIVE
Crystals, Ur, HPF, POC: NEGATIVE
RBC, urine, microscopic: NEGATIVE
YEAST UA: POSITIVE

## 2013-06-28 LAB — GLUCOSE, POCT (MANUAL RESULT ENTRY): POC Glucose: 181 mg/dl — AB (ref 70–99)

## 2013-06-28 MED ORDER — FLUCONAZOLE 100 MG PO TABS: 100.0000 mg | ORAL_TABLET | Freq: Every day | ORAL | Status: DC

## 2013-06-28 MED ORDER — DICLOFENAC SODIUM 50 MG PO TBEC: 50.0000 mg | DELAYED_RELEASE_TABLET | Freq: Two times a day (BID) | ORAL | Status: DC

## 2013-06-28 NOTE — Patient Instructions (Addendum)
Discontinue the meloxicam  Take diclofenac 50 mg one twice daily with food.  If you had any stomach pain or irritation or change in your bowel movements stop it immediately and return for a recheck.  If pain continues to persist in the shoulders see your orthopedic doctor to consider injection, but also advised discussing the use of cortisones with Dr. Carlota Raspberry  Take the fluconazole 100 mg one daily for the urinary infection for 10 days.  After completing the medicine for your urinary infection wait about 5 days and return either to Dr. Nyoka Cowden or here for a repeat of the urinalysis.

## 2013-06-28 NOTE — Progress Notes (Signed)
Subjective:       Results for orders placed in visit on 06/28/13  GLUCOSE, POCT (MANUAL RESULT ENTRY)      Result Value Ref Range   POC Glucose 181 (*) 70 - 99 mg/dl  POCT UA - MICROSCOPIC ONLY      Result Value Ref Range   WBC, Ur, HPF, POC tntc     RBC, urine, microscopic neg     Bacteria, U Microscopic 3+     Mucus, UA trace     Epithelial cells, urine per micros 0-6     Crystals, Ur, HPF, POC neg     Casts, Ur, LPF, POC neg     Yeast, UA pos    POCT URINALYSIS DIPSTICK      Result Value Ref Range   Color, UA yellow     Clarity, UA cloudy     Glucose, UA neg     Bilirubin, UA neg     Ketones, UA neg     Spec Grav, UA 1.025     Blood, UA neg     pH, UA 6.0     Protein, UA trace     Urobilinogen, UA 0.2     Nitrite, UA pos     Leukocytes, UA small (1+)      [facility] reading (PRIMARY) by  Dr. Linna Darner Mild arthritic changes of shoulders  Assessment: Monilia cystitis Shoulder arthritis

## 2013-07-01 LAB — URINE CULTURE: Colony Count: >=100000

## 2013-07-02 ENCOUNTER — Encounter: Payer: Self-pay | Admitting: Internal Medicine

## 2013-07-03 MED ORDER — NITROFURANTOIN MONOHYD MACRO 100 MG PO CAPS: 100.0000 mg | ORAL_CAPSULE | Freq: Two times a day (BID) | ORAL | Status: DC

## 2013-07-05 ENCOUNTER — Encounter: Payer: Self-pay | Admitting: *Deleted

## 2013-07-11 ENCOUNTER — Other Ambulatory Visit: Payer: Self-pay | Admitting: Nurse Practitioner

## 2013-07-12 ENCOUNTER — Other Ambulatory Visit: Payer: Self-pay | Admitting: *Deleted

## 2013-07-12 MED ORDER — PREDNISONE 1 MG PO TABS: ORAL_TABLET | ORAL | Status: DC

## 2013-07-21 ENCOUNTER — Other Ambulatory Visit: Payer: Self-pay | Admitting: Family Medicine

## 2013-08-03 ENCOUNTER — Other Ambulatory Visit: Payer: Self-pay | Admitting: Internal Medicine

## 2013-08-11 ENCOUNTER — Other Ambulatory Visit: Payer: Self-pay | Admitting: Internal Medicine

## 2013-08-21 ENCOUNTER — Telehealth: Payer: Self-pay | Admitting: *Deleted

## 2013-08-21 NOTE — Telephone Encounter (Signed)
Patient called and stated that she was having arthritis pain and wants medication called in for it. Offered patient an appointment and she stated that she already has one and will wait till that time.

## 2013-08-27 ENCOUNTER — Other Ambulatory Visit: Payer: Self-pay | Admitting: *Deleted

## 2013-08-27 MED ORDER — INSULIN ASPART 100 UNIT/ML ~~LOC~~ SOLN: 0.0000 [IU] | SUBCUTANEOUS | Status: DC | PRN

## 2013-08-27 NOTE — Telephone Encounter (Signed)
[facility] [place] 

## 2013-09-04 ENCOUNTER — Other Ambulatory Visit: Payer: Commercial Managed Care - HMO

## 2013-09-04 ENCOUNTER — Ambulatory Visit (INDEPENDENT_AMBULATORY_CARE_PROVIDER_SITE_OTHER): Payer: Commercial Managed Care - HMO | Admitting: Internal Medicine

## 2013-09-04 ENCOUNTER — Encounter: Payer: Self-pay | Admitting: Internal Medicine

## 2013-09-04 VITALS — BP 122/82 | HR 68 | Temp 97.6°F | Wt 153.0 lb

## 2013-09-04 DIAGNOSIS — E1165 Type 2 diabetes mellitus with hyperglycemia: Principal | ICD-10-CM

## 2013-09-04 DIAGNOSIS — S8010XA Contusion of unspecified lower leg, initial encounter: Secondary | ICD-10-CM

## 2013-09-04 DIAGNOSIS — N183 Chronic kidney disease, stage 3 unspecified: Secondary | ICD-10-CM

## 2013-09-04 DIAGNOSIS — H6120 Impacted cerumen, unspecified ear: Secondary | ICD-10-CM

## 2013-09-04 DIAGNOSIS — S8012XA Contusion of left lower leg, initial encounter: Secondary | ICD-10-CM

## 2013-09-04 DIAGNOSIS — M353 Polymyalgia rheumatica: Secondary | ICD-10-CM

## 2013-09-04 DIAGNOSIS — G609 Hereditary and idiopathic neuropathy, unspecified: Secondary | ICD-10-CM

## 2013-09-04 DIAGNOSIS — I5032 Chronic diastolic (congestive) heart failure: Secondary | ICD-10-CM

## 2013-09-04 DIAGNOSIS — I10 Essential (primary) hypertension: Secondary | ICD-10-CM

## 2013-09-04 DIAGNOSIS — E785 Hyperlipidemia, unspecified: Secondary | ICD-10-CM

## 2013-09-04 DIAGNOSIS — E1129 Type 2 diabetes mellitus with other diabetic kidney complication: Secondary | ICD-10-CM

## 2013-09-04 DIAGNOSIS — H6123 Impacted cerumen, bilateral: Secondary | ICD-10-CM

## 2013-09-04 DIAGNOSIS — R1310 Dysphagia, unspecified: Secondary | ICD-10-CM

## 2013-09-04 DIAGNOSIS — D649 Anemia, unspecified: Secondary | ICD-10-CM

## 2013-09-04 DIAGNOSIS — M542 Cervicalgia: Secondary | ICD-10-CM

## 2013-09-04 DIAGNOSIS — I509 Heart failure, unspecified: Secondary | ICD-10-CM

## 2013-09-04 DIAGNOSIS — M159 Polyosteoarthritis, unspecified: Secondary | ICD-10-CM

## 2013-09-04 MED ORDER — TRAMADOL HCL 50 MG PO TABS: ORAL_TABLET | ORAL | Status: DC

## 2013-09-04 MED ORDER — AMITRIPTYLINE HCL 50 MG PO TABS: 50.0000 mg | ORAL_TABLET | Freq: Every day | ORAL | Status: DC

## 2013-09-04 MED ORDER — MELOXICAM 15 MG PO TABS: ORAL_TABLET | ORAL | Status: DC

## 2013-09-04 MED ORDER — GABAPENTIN 300 MG PO CAPS: ORAL_CAPSULE | ORAL | Status: DC

## 2013-09-04 NOTE — Patient Instructions (Signed)
Stop diclofenac

## 2013-09-04 NOTE — Progress Notes (Signed)
Patient ID: [name], female   DOB: 1924-09-17, 80 y.o.   MRN: [id number]    Location:    [facility]  Place of Service:  OFFICE    Allergies  Allergen Reactions  . Allegra [Fexofenadine Hcl]   . Levaquin [Levofloxacin In D5w]   . Tussionex Pennkinetic Er [Hydrocod Polst-Cpm Polst Er]   . Codeine Nausea And Vomiting  . Morphine And Related Nausea And Vomiting    Chief Complaint  Patient presents with  . Medical Management of Chronic Issues    blood sugar, blood pressure, CHF, c/o arthritis pain, esp in left hand    HPI:    Type II or unspecified type diabetes mellitus with renal manifestations, uncontrolled(250.42) - satisfactory control  Anemia, unspecified anemia type: Corrected as of November 2014  Chronic kidney disease, stage III (moderate): Stable  Chronic diastolic congestive heart failure: Compensated  Essential hypertension: Controlled  Polymyalgia rheumatica: Improved  Hematoma of leg, left, initial encounter: Lump on the left leg where she bumped into a piece of furniture  Dysphagia, unspecified: Improved  Generalized osteoarthritis of hand - Arthritis pains in both hands, L>R.  Cervicalgia - benefits from amitriptyline (ELAVIL) 50 MG tablet  Hyperlipidemia - controlled  Unspecified hereditary and idiopathic peripheral neuropathy - benefits from gabapentin (NEURONTIN) 300 MG capsule  Cerumen impaction, bilateral: Needs a lavage    Medications: Patient's Medications  New Prescriptions   No medications on file  Previous Medications   ACCU-CHEK SMARTVIEW TEST STRIP       ACYCLOVIR (ZOVIRAX) 200 MG CAPSULE    TAKE ONE CAPSULE BY MOUTH ONCE DAILY TO  PREVENT  RELAPSE  OF  HERPES  SIMPLEX   ALCOHOL SWABS (ALCOHOL PADS) 70 % PADS       AMITRIPTYLINE (ELAVIL) 50 MG TABLET    Take 50 mg by mouth at bedtime.   ASPIRIN EC 325 MG TABLET    Take 325 mg by mouth daily.    BLOOD GLUCOSE CALIBRATION (ACCU-CHEK AVIVA) SOLN       BLOOD GLUCOSE MONITORING SUPPL  (ACCU-CHEK NANO SMARTVIEW) W/DEVICE KIT       DICLOFENAC (VOLTAREN) 50 MG EC TABLET    Take 1 tablet (50 mg total) by mouth 2 (two) times daily.   FLUCONAZOLE (DIFLUCAN) 100 MG TABLET    Take 1 tablet (100 mg total) by mouth daily.   FUROSEMIDE (LASIX) 20 MG TABLET    Take 20 mg by mouth daily.   GABAPENTIN (NEURONTIN) 300 MG CAPSULE    TAKE TWO CAPSULES BY MOUTH THREE TIMES DAILY TO HELP CONTROL PAIN   INSULIN ASPART (NOVOLOG) 100 UNIT/ML INJECTION    Inject 0-15 Units into the skin as needed. Sugar level less than 150 do not take any Novolog, if sugar level is between 151-200 take 3 units, 201-250 take 5 units, 251-300 take 7 units 301-350 take 9 units, 351-400 take 11 units, sugar level over 400 take 15 units and notify docotor   INSULIN DETEMIR (LEVEMIR) 100 UNIT/ML INJECTION    Inject 30 Units into the skin at bedtime.   INSULIN PEN NEEDLE (B-D ULTRAFINE III SHORT PEN) 31G X 8 MM MISC    Use for administering insulin by pen   LANCET DEVICES (LITE TOUCH LANCING PEN) MISC       LEVEMIR FLEXTOUCH 100 UNIT/ML PEN    INJECT 30 UNITS SUBCUTANEOUSLY AT BEDTIME TO CONTROL DIABETES   LITE TOUCH LANCETS MISC       MULTIPLE VITAMIN (MULTIVITAMIN WITH MINERALS) TABS  Take 1 tablet by mouth daily.   OXYBUTYNIN (DITROPAN) 5 MG TABLET    TAKE 1 TABLE ORALLY UP TO 3 TIMES A DAY TO HELP BLADDER CONTROL   PREDNISONE (DELTASONE) 1 MG TABLET    TAKE THREE TABLETS BY MOUTH ONCE DAILY IN THE MORNING   TRAMADOL (ULTRAM) 50 MG TABLET    TAKE ONE TABLET BY MOUTH EVERY 8 HOURS AS NEEDED FOR PAIN  Modified Medications   No medications on file  Discontinued Medications   NITROFURANTOIN, MACROCRYSTAL-MONOHYDRATE, (MACROBID) 100 MG CAPSULE    Take 1 capsule (100 mg total) by mouth 2 (two) times daily.   PREDNISONE (DELTASONE) 1 MG TABLET         Review of Systems  Constitutional: Positive for activity change and fatigue. Negative for fever, chills, diaphoresis and appetite change.  HENT: Positive for hearing loss  and tinnitus. Negative for congestion, ear discharge, ear pain, facial swelling, nosebleeds, postnasal drip, rhinorrhea, sinus pressure and sneezing.   Eyes: Negative.   Respiratory: Positive for shortness of breath. Negative for cough, choking and wheezing.   Cardiovascular: Positive for leg swelling. Negative for chest pain and palpitations.  Gastrointestinal: Positive for constipation. Negative for nausea, vomiting, abdominal pain, diarrhea, blood in stool, abdominal distention, anal bleeding and rectal pain.  Endocrine: Negative for cold intolerance, heat intolerance, polydipsia, polyphagia and polyuria.  Genitourinary: Positive for urgency, frequency and flank pain. Negative for hematuria, genital sores and pelvic pain.  Musculoskeletal: Positive for arthralgias, back pain, gait problem, myalgias, neck pain and neck stiffness. Negative for joint swelling.  Skin: Positive for pallor.  Allergic/Immunologic: Negative.   Neurological: Positive for weakness and headaches. Negative for dizziness, tremors, seizures, syncope, speech difficulty, light-headedness and numbness.  Hematological: Negative.   Psychiatric/Behavioral: Positive for sleep disturbance. Negative for suicidal ideas, hallucinations, behavioral problems, confusion, self-injury and dysphoric mood. The patient is nervous/anxious. The patient is not hyperactive.        Short-term memory loss.    Filed Vitals:   09/04/13 1538  BP: 122/82  Pulse: 68  Temp: 97.6 F (36.4 C)  TempSrc: Oral  Weight: 153 lb (69.4 kg)  SpO2: 96%   Body mass index is 28.44 kg/(m^2).  Physical Exam  Constitutional: She is oriented to person, place, and time. No distress.  Frail elderly female  HENT:  Head: Normocephalic and atraumatic.  Right Ear: External ear normal.  Left Ear: External ear normal.  Nose: Nose normal.  Mouth/Throat: No oropharyngeal exudate.  Partial loss of hearing bilaterally. Bilateral cerumen impactions.  Eyes:  Conjunctivae and EOM are normal. Pupils are equal, round, and reactive to light.  Status post bilateral cataract extractions with intraocular lens implants.  Neck: Normal range of motion. Neck supple. No JVD present. No tracheal deviation present. No thyromegaly present.  Cardiovascular: Normal rate, regular rhythm, normal heart sounds and intact distal pulses.  Exam reveals no gallop and no friction rub.   No murmur heard. Pulmonary/Chest: Effort normal. No respiratory distress. She has no wheezes. She has rales (Velcro rales bilterally and diffuse). She exhibits no tenderness.  Abdominal: Soft. Bowel sounds are normal. She exhibits no distension and no mass. There is no tenderness.  Musculoskeletal: Normal range of motion. She exhibits no edema and no tenderness.  Unstable gait. Using cane and walker. Deformities of the hands and knuckles. Ulnar drift. Swollen at the MCP joints. Heberden's nodes.  Lymphadenopathy:    She has no cervical adenopathy.  Neurological: She is alert and oriented to person, place, and  time. No cranial nerve deficit.  Skin: Skin is warm and dry. No rash noted. No erythema. No pallor.  Psychiatric: She has a normal mood and affect. Her behavior is normal. Judgment and thought content normal.     Labs reviewed: Office Visit on 09/04/2013  Component Date Value Ref Range Status  . Hemoglobin A1C 09/04/2013 7.0* 4.8 - 5.6 % Final   Comment:          Increased risk for diabetes: 5.7 - 6.4                                   Diabetes: >6.4                                   Glycemic control for adults with diabetes: <7.0  . Estimated average glucose 09/04/2013 154   Final  . Glucose 09/04/2013 83  65 - 99 mg/dL Final  . BUN 09/04/2013 35* 8 - 27 mg/dL Final  . Creatinine, Ser 09/04/2013 1.01* 0.57 - 1.00 mg/dL Final  . GFR calc non Af Amer 09/04/2013 51* >59 mL/min/1.73 Final  . GFR calc Af Amer 09/04/2013 58* >59 mL/min/1.73 Final  . BUN/Creatinine Ratio 09/04/2013 35*  11 - 26 Final  . Sodium 09/04/2013 140  134 - 144 mmol/L Final  . Potassium 09/04/2013 5.3* 3.5 - 5.2 mmol/L Final  . Chloride 09/04/2013 100  97 - 108 mmol/L Final  . CO2 09/04/2013 23  18 - 29 mmol/L Final  . Calcium 09/04/2013 10.2  8.7 - 10.3 mg/dL Final  . Total Protein 09/04/2013 7.0  6.0 - 8.5 g/dL Final  . Albumin 09/04/2013 4.4  3.5 - 4.7 g/dL Final  . Globulin, Total 09/04/2013 2.6  1.5 - 4.5 g/dL Final  . Albumin/Globulin Ratio 09/04/2013 1.7  1.1 - 2.5 Final  . Total Bilirubin 09/04/2013 0.3  0.0 - 1.2 mg/dL Final  . Alkaline Phosphatase 09/04/2013 70  39 - 117 IU/L Final  . AST 09/04/2013 24  0 - 40 IU/L Final  . ALT 09/04/2013 13  0 - 32 IU/L Final  . Cholesterol, Total 09/04/2013 131  100 - 199 mg/dL Final  . Triglycerides 09/04/2013 65  0 - 149 mg/dL Final  . HDL 09/04/2013 62  >39 mg/dL Final   Comment: According to ATP-III Guidelines, HDL-C >59 mg/dL is considered a                          negative risk factor for CHD.  Marland Kitchen VLDL Cholesterol Cal 09/04/2013 13  5 - 40 mg/dL Final  . LDL Calculated 09/04/2013 56  0 - 99 mg/dL Final  . Chol/HDL Ratio 09/04/2013 2.1  0.0 - 4.4 ratio units Final   Comment:                                   T. Chol/HDL Ratio  Men  Women                                                        1/2 Avg.Risk  3.4    3.3                                                            Avg.Risk  5.0    4.4                                                         2X Avg.Risk  9.6    7.1                                                         3X Avg.Risk 23.4   11.0  . Microalbum.,U,Random 09/04/2013 17.3* 0.0 - 17.0 ug/mL Final  Office Visit on 06/28/2013  Component Date Value Ref Range Status  . POC Glucose 06/28/2013 181* 70 - 99 mg/dl Final  . WBC, Ur, HPF, POC 06/28/2013 tntc   Final  . RBC, urine, microscopic 06/28/2013 neg   Final  . Bacteria, U Microscopic 06/28/2013 3+   Final    . Mucus, UA 06/28/2013 trace   Final  . Epithelial cells, urine per micros 06/28/2013 0-6   Final  . Crystals, Ur, HPF, POC 06/28/2013 neg   Final  . Casts, Ur, LPF, POC 06/28/2013 neg   Final  . Yeast, UA 06/28/2013 pos   Final  . Color, UA 06/28/2013 yellow   Final  . Clarity, UA 06/28/2013 cloudy   Final  . Glucose, UA 06/28/2013 neg   Final  . Bilirubin, UA 06/28/2013 neg   Final  . Ketones, UA 06/28/2013 neg   Final  . Spec Grav, UA 06/28/2013 1.025   Final  . Blood, UA 06/28/2013 neg   Final  . pH, UA 06/28/2013 6.0   Final  . Protein, UA 06/28/2013 trace   Final  . Urobilinogen, UA 06/28/2013 0.2   Final  . Nitrite, UA 06/28/2013 pos   Final  . Leukocytes, UA 06/28/2013 small (1+)   Final  . Culture 06/28/2013 ESCHERICHIA COLI   Final  . Colony Count 06/28/2013 >=100,000 COLONIES/ML   Final  . Organism ID, Bacteria 06/28/2013 ESCHERICHIA COLI   Final      Assessment/Plan  1. Type II or unspecified type diabetes mellitus with renal manifestations, uncontrolled(250.42): Under adequate control - Hemoglobin A1c - CMP - Lipid panel - Microalbumin, urine - Comprehensive metabolic panel; Future - Hemoglobin A1c; Future - Microalbumin, urine  2. Anemia, unspecified anemia type Resolved  3. Chronic kidney disease, stage III (moderate) Stable  4. Chronic diastolic congestive heart failure Compensated  5. Essential hypertension Controlled  6. Polymyalgia rheumatica Improved  7. Hematoma of leg,  left, initial encounter This will resolve over time. Observe.  8. Dysphagia, unspecified Improved  9. Generalized osteoarthritis of hand - meloxicam (MOBIC) 15 MG tablet; One daily to help arthritis  Dispense: 30 tablet; Refill: 5 - traMADol (ULTRAM) 50 MG tablet; TAKE ONE TABLET BY MOUTH EVERY 8 HOURS AS NEEDED FOR PAIN  Dispense: 90 tablet; Refill: 0  10. Cervicalgia - amitriptyline (ELAVIL) 50 MG tablet; Take 1 tablet (50 mg total) by mouth at bedtime.  Dispense:  90 tablet; Refill: 3  11. Hyperlipidemia Stable - Lipid panel; Future  12. Unspecified hereditary and idiopathic peripheral neuropathy - gabapentin (NEURONTIN) 300 MG capsule; TAKE ONE CAPSULES BY MOUTH THREE TIMES DAILY TO HELP CONTROL PAIN  Dispense: 180 capsule; Refill: 3  13. Cerumen impaction, bilateral Lavage during the course of the office visit today.

## 2013-09-05 LAB — COMPREHENSIVE METABOLIC PANEL
A/G RATIO: 1.7 (ref 1.1–2.5)
ALT: 13 IU/L (ref 0–32)
AST: 24 IU/L (ref 0–40)
Albumin: 4.4 g/dL (ref 3.5–4.7)
Alkaline Phosphatase: 70 IU/L (ref 39–117)
BILIRUBIN TOTAL: 0.3 mg/dL (ref 0.0–1.2)
BUN/Creatinine Ratio: 35 — ABNORMAL HIGH (ref 11–26)
BUN: 35 mg/dL — ABNORMAL HIGH (ref 8–27)
CO2: 23 mmol/L (ref 18–29)
CREATININE: 1.01 mg/dL — AB (ref 0.57–1.00)
Calcium: 10.2 mg/dL (ref 8.7–10.3)
Chloride: 100 mmol/L (ref 97–108)
GFR, EST AFRICAN AMERICAN: 58 mL/min/{1.73_m2} — AB (ref >59)
GFR, EST NON AFRICAN AMERICAN: 51 mL/min/{1.73_m2} — AB (ref >59)
GLOBULIN, TOTAL: 2.6 g/dL (ref 1.5–4.5)
Glucose: 83 mg/dL (ref 65–99)
POTASSIUM: 5.3 mmol/L — AB (ref 3.5–5.2)
Sodium: 140 mmol/L (ref 134–144)
TOTAL PROTEIN: 7 g/dL (ref 6.0–8.5)

## 2013-09-05 LAB — LIPID PANEL
CHOL/HDL RATIO: 2.1 ratio (ref 0.0–4.4)
Cholesterol, Total: 131 mg/dL (ref 100–199)
HDL: 62 mg/dL (ref >39)
LDL Calculated: 56 mg/dL (ref 0–99)
Triglycerides: 65 mg/dL (ref 0–149)
VLDL Cholesterol Cal: 13 mg/dL (ref 5–40)

## 2013-09-05 LAB — HEMOGLOBIN A1C
Est. average glucose Bld gHb Est-mCnc: 154 mg/dL
Hgb A1c MFr Bld: 7 % — ABNORMAL HIGH (ref 4.8–5.6)

## 2013-09-05 LAB — MICROALBUMIN, URINE: MICROALBUM., U, RANDOM: 17.3 ug/mL — AB (ref 0.0–17.0)

## 2013-09-11 ENCOUNTER — Other Ambulatory Visit: Payer: Self-pay | Admitting: Internal Medicine

## 2013-09-16 ENCOUNTER — Other Ambulatory Visit: Payer: Self-pay | Admitting: *Deleted

## 2013-09-16 MED ORDER — PREDNISONE 1 MG PO TABS: ORAL_TABLET | ORAL | Status: DC

## 2013-09-16 NOTE — Telephone Encounter (Signed)
Patient Requested 

## 2013-09-21 ENCOUNTER — Other Ambulatory Visit: Payer: Self-pay | Admitting: Internal Medicine

## 2013-10-03 ENCOUNTER — Other Ambulatory Visit: Payer: Self-pay | Admitting: Radiology

## 2013-10-03 ENCOUNTER — Other Ambulatory Visit (HOSPITAL_COMMUNITY): Payer: Self-pay | Admitting: Interventional Radiology

## 2013-10-03 ENCOUNTER — Encounter: Payer: Self-pay | Admitting: Radiology

## 2013-10-03 DIAGNOSIS — D3001 Benign neoplasm of right kidney: Secondary | ICD-10-CM

## 2013-10-11 ENCOUNTER — Other Ambulatory Visit: Payer: Self-pay | Admitting: Internal Medicine

## 2013-10-22 ENCOUNTER — Other Ambulatory Visit: Payer: Self-pay | Admitting: Internal Medicine

## 2013-11-06 ENCOUNTER — Ambulatory Visit (HOSPITAL_COMMUNITY)
Admission: RE | Admit: 2013-11-06 | Discharge: 2013-11-06 | Disposition: A | Discharge status: Home / Self Care | Payer: Medicare HMO | Source: Ambulatory Visit | Attending: Interventional Radiology | Admitting: Interventional Radiology

## 2013-11-06 ENCOUNTER — Inpatient Hospital Stay (HOSPITAL_COMMUNITY)
Admission: EM | Admit: 2013-11-06 | Discharge: 2013-11-08 | DRG: 086 | Disposition: A | Discharge status: Home / Self Care | Payer: Medicare HMO | Attending: Internal Medicine | Admitting: Internal Medicine

## 2013-11-06 ENCOUNTER — Ambulatory Visit: Payer: Medicare PPO

## 2013-11-06 ENCOUNTER — Encounter (HOSPITAL_COMMUNITY): Payer: Self-pay | Admitting: Emergency Medicine

## 2013-11-06 ENCOUNTER — Other Ambulatory Visit: Payer: Medicare PPO

## 2013-11-06 ENCOUNTER — Emergency Department (HOSPITAL_COMMUNITY): Payer: Medicare HMO

## 2013-11-06 ENCOUNTER — Encounter (HOSPITAL_COMMUNITY): Payer: Self-pay

## 2013-11-06 DIAGNOSIS — D693 Immune thrombocytopenic purpura: Secondary | ICD-10-CM

## 2013-11-06 DIAGNOSIS — W19XXXA Unspecified fall, initial encounter: Secondary | ICD-10-CM | POA: Diagnosis present

## 2013-11-06 DIAGNOSIS — M810 Age-related osteoporosis without current pathological fracture: Secondary | ICD-10-CM | POA: Diagnosis present

## 2013-11-06 DIAGNOSIS — R279 Unspecified lack of coordination: Secondary | ICD-10-CM

## 2013-11-06 DIAGNOSIS — Y9229 Other specified public building as the place of occurrence of the external cause: Secondary | ICD-10-CM | POA: Diagnosis not present

## 2013-11-06 DIAGNOSIS — R0981 Nasal congestion: Secondary | ICD-10-CM

## 2013-11-06 DIAGNOSIS — M25519 Pain in unspecified shoulder: Secondary | ICD-10-CM

## 2013-11-06 DIAGNOSIS — S065XAA Traumatic subdural hemorrhage with loss of consciousness status unknown, initial encounter: Secondary | ICD-10-CM | POA: Diagnosis present

## 2013-11-06 DIAGNOSIS — Z96649 Presence of unspecified artificial hip joint: Secondary | ICD-10-CM

## 2013-11-06 DIAGNOSIS — I6200 Nontraumatic subdural hemorrhage, unspecified: Secondary | ICD-10-CM

## 2013-11-06 DIAGNOSIS — IMO0002 Reserved for concepts with insufficient information to code with codable children: Secondary | ICD-10-CM

## 2013-11-06 DIAGNOSIS — I5032 Chronic diastolic (congestive) heart failure: Secondary | ICD-10-CM

## 2013-11-06 DIAGNOSIS — M353 Polymyalgia rheumatica: Secondary | ICD-10-CM | POA: Diagnosis present

## 2013-11-06 DIAGNOSIS — E1165 Type 2 diabetes mellitus with hyperglycemia: Secondary | ICD-10-CM | POA: Diagnosis present

## 2013-11-06 DIAGNOSIS — N390 Urinary tract infection, site not specified: Secondary | ICD-10-CM

## 2013-11-06 DIAGNOSIS — Z7982 Long term (current) use of aspirin: Secondary | ICD-10-CM | POA: Diagnosis not present

## 2013-11-06 DIAGNOSIS — D500 Iron deficiency anemia secondary to blood loss (chronic): Secondary | ICD-10-CM | POA: Diagnosis present

## 2013-11-06 DIAGNOSIS — D3001 Benign neoplasm of right kidney: Secondary | ICD-10-CM

## 2013-11-06 DIAGNOSIS — D62 Acute posthemorrhagic anemia: Secondary | ICD-10-CM | POA: Diagnosis present

## 2013-11-06 DIAGNOSIS — R6510 Systemic inflammatory response syndrome (SIRS) of non-infectious origin without acute organ dysfunction: Secondary | ICD-10-CM

## 2013-11-06 DIAGNOSIS — K8020 Calculus of gallbladder without cholecystitis without obstruction: Secondary | ICD-10-CM

## 2013-11-06 DIAGNOSIS — M25569 Pain in unspecified knee: Secondary | ICD-10-CM

## 2013-11-06 DIAGNOSIS — K766 Portal hypertension: Secondary | ICD-10-CM | POA: Diagnosis present

## 2013-11-06 DIAGNOSIS — R0989 Other specified symptoms and signs involving the circulatory and respiratory systems: Secondary | ICD-10-CM

## 2013-11-06 DIAGNOSIS — D649 Anemia, unspecified: Secondary | ICD-10-CM

## 2013-11-06 DIAGNOSIS — R350 Frequency of micturition: Secondary | ICD-10-CM

## 2013-11-06 DIAGNOSIS — K7460 Unspecified cirrhosis of liver: Secondary | ICD-10-CM | POA: Insufficient documentation

## 2013-11-06 DIAGNOSIS — Z9181 History of falling: Secondary | ICD-10-CM | POA: Diagnosis not present

## 2013-11-06 DIAGNOSIS — N3020 Other chronic cystitis without hematuria: Secondary | ICD-10-CM | POA: Diagnosis present

## 2013-11-06 DIAGNOSIS — J4489 Other specified chronic obstructive pulmonary disease: Secondary | ICD-10-CM | POA: Diagnosis present

## 2013-11-06 DIAGNOSIS — R269 Unspecified abnormalities of gait and mobility: Secondary | ICD-10-CM

## 2013-11-06 DIAGNOSIS — Z96659 Presence of unspecified artificial knee joint: Secondary | ICD-10-CM

## 2013-11-06 DIAGNOSIS — E440 Moderate protein-calorie malnutrition: Secondary | ICD-10-CM | POA: Diagnosis present

## 2013-11-06 DIAGNOSIS — N183 Chronic kidney disease, stage 3 unspecified: Secondary | ICD-10-CM | POA: Diagnosis present

## 2013-11-06 DIAGNOSIS — E1129 Type 2 diabetes mellitus with other diabetic kidney complication: Secondary | ICD-10-CM | POA: Diagnosis present

## 2013-11-06 DIAGNOSIS — D3000 Benign neoplasm of unspecified kidney: Secondary | ICD-10-CM | POA: Insufficient documentation

## 2013-11-06 DIAGNOSIS — E1149 Type 2 diabetes mellitus with other diabetic neurological complication: Secondary | ICD-10-CM | POA: Diagnosis present

## 2013-11-06 DIAGNOSIS — M542 Cervicalgia: Secondary | ICD-10-CM

## 2013-11-06 DIAGNOSIS — J449 Chronic obstructive pulmonary disease, unspecified: Secondary | ICD-10-CM | POA: Diagnosis present

## 2013-11-06 DIAGNOSIS — I129 Hypertensive chronic kidney disease with stage 1 through stage 4 chronic kidney disease, or unspecified chronic kidney disease: Secondary | ICD-10-CM | POA: Diagnosis present

## 2013-11-06 DIAGNOSIS — I509 Heart failure, unspecified: Secondary | ICD-10-CM | POA: Diagnosis present

## 2013-11-06 DIAGNOSIS — M1990 Unspecified osteoarthritis, unspecified site: Secondary | ICD-10-CM

## 2013-11-06 DIAGNOSIS — F329 Major depressive disorder, single episode, unspecified: Secondary | ICD-10-CM

## 2013-11-06 DIAGNOSIS — B351 Tinea unguium: Secondary | ICD-10-CM

## 2013-11-06 DIAGNOSIS — I6990 Unspecified sequelae of unspecified cerebrovascular disease: Secondary | ICD-10-CM

## 2013-11-06 DIAGNOSIS — R1310 Dysphagia, unspecified: Secondary | ICD-10-CM

## 2013-11-06 DIAGNOSIS — M1710 Unilateral primary osteoarthritis, unspecified knee: Secondary | ICD-10-CM

## 2013-11-06 DIAGNOSIS — M159 Polyosteoarthritis, unspecified: Secondary | ICD-10-CM

## 2013-11-06 DIAGNOSIS — R58 Hemorrhage, not elsewhere classified: Secondary | ICD-10-CM

## 2013-11-06 DIAGNOSIS — R32 Unspecified urinary incontinence: Secondary | ICD-10-CM

## 2013-11-06 DIAGNOSIS — M129 Arthropathy, unspecified: Secondary | ICD-10-CM | POA: Diagnosis present

## 2013-11-06 DIAGNOSIS — K661 Hemoperitoneum: Secondary | ICD-10-CM

## 2013-11-06 DIAGNOSIS — C659 Malignant neoplasm of unspecified renal pelvis: Secondary | ICD-10-CM

## 2013-11-06 DIAGNOSIS — R0902 Hypoxemia: Secondary | ICD-10-CM

## 2013-11-06 DIAGNOSIS — B029 Zoster without complications: Secondary | ICD-10-CM

## 2013-11-06 DIAGNOSIS — E1142 Type 2 diabetes mellitus with diabetic polyneuropathy: Secondary | ICD-10-CM | POA: Diagnosis present

## 2013-11-06 DIAGNOSIS — G5601 Carpal tunnel syndrome, right upper limb: Secondary | ICD-10-CM

## 2013-11-06 DIAGNOSIS — N058 Unspecified nephritic syndrome with other morphologic changes: Secondary | ICD-10-CM | POA: Diagnosis present

## 2013-11-06 DIAGNOSIS — Z8673 Personal history of transient ischemic attack (TIA), and cerebral infarction without residual deficits: Secondary | ICD-10-CM

## 2013-11-06 DIAGNOSIS — M7120 Synovial cyst of popliteal space [Baker], unspecified knee: Secondary | ICD-10-CM

## 2013-11-06 DIAGNOSIS — Z6826 Body mass index (BMI) 26.0-26.9, adult: Secondary | ICD-10-CM

## 2013-11-06 DIAGNOSIS — G609 Hereditary and idiopathic neuropathy, unspecified: Secondary | ICD-10-CM

## 2013-11-06 DIAGNOSIS — S065X9A Traumatic subdural hemorrhage with loss of consciousness of unspecified duration, initial encounter: Secondary | ICD-10-CM | POA: Diagnosis present

## 2013-11-06 DIAGNOSIS — I10 Essential (primary) hypertension: Secondary | ICD-10-CM | POA: Diagnosis present

## 2013-11-06 DIAGNOSIS — F411 Generalized anxiety disorder: Secondary | ICD-10-CM

## 2013-11-06 DIAGNOSIS — E785 Hyperlipidemia, unspecified: Secondary | ICD-10-CM

## 2013-11-06 DIAGNOSIS — M47812 Spondylosis without myelopathy or radiculopathy, cervical region: Secondary | ICD-10-CM

## 2013-11-06 DIAGNOSIS — M6281 Muscle weakness (generalized): Secondary | ICD-10-CM

## 2013-11-06 DIAGNOSIS — Z794 Long term (current) use of insulin: Secondary | ICD-10-CM

## 2013-11-06 DIAGNOSIS — N179 Acute kidney failure, unspecified: Secondary | ICD-10-CM | POA: Diagnosis present

## 2013-11-06 DIAGNOSIS — S0100XA Unspecified open wound of scalp, initial encounter: Secondary | ICD-10-CM | POA: Diagnosis present

## 2013-11-06 DIAGNOSIS — Z87891 Personal history of nicotine dependence: Secondary | ICD-10-CM | POA: Diagnosis not present

## 2013-11-06 DIAGNOSIS — Z8553 Personal history of malignant neoplasm of renal pelvis: Secondary | ICD-10-CM | POA: Diagnosis not present

## 2013-11-06 DIAGNOSIS — R42 Dizziness and giddiness: Secondary | ICD-10-CM

## 2013-11-06 DIAGNOSIS — F32A Depression, unspecified: Secondary | ICD-10-CM

## 2013-11-06 DIAGNOSIS — K648 Other hemorrhoids: Secondary | ICD-10-CM

## 2013-11-06 DIAGNOSIS — N151 Renal and perinephric abscess: Secondary | ICD-10-CM

## 2013-11-06 DIAGNOSIS — K449 Diaphragmatic hernia without obstruction or gangrene: Secondary | ICD-10-CM

## 2013-11-06 LAB — CBC WITH DIFFERENTIAL/PLATELET
BASOS PCT: 0 % (ref 0–1)
Basophils Absolute: 0 10*3/uL (ref 0.0–0.1)
EOS ABS: 0.1 10*3/uL (ref 0.0–0.7)
Eosinophils Relative: 1 % (ref 0–5)
HCT: 35.5 % — ABNORMAL LOW (ref 36.0–46.0)
Hemoglobin: 11.6 g/dL — ABNORMAL LOW (ref 12.0–15.0)
Lymphocytes Relative: 14 % (ref 12–46)
Lymphs Abs: 1 10*3/uL (ref 0.7–4.0)
MCH: 30.4 pg (ref 26.0–34.0)
MCHC: 32.7 g/dL (ref 30.0–36.0)
MCV: 93.2 fL (ref 78.0–100.0)
MONOS PCT: 6 % (ref 3–12)
Monocytes Absolute: 0.4 10*3/uL (ref 0.1–1.0)
NEUTROS PCT: 79 % — AB (ref 43–77)
Neutro Abs: 5.7 10*3/uL (ref 1.7–7.7)
Platelets: 156 10*3/uL (ref 150–400)
RBC: 3.81 MIL/uL — ABNORMAL LOW (ref 3.87–5.11)
RDW: 15.9 % — ABNORMAL HIGH (ref 11.5–15.5)
WBC: 7.2 10*3/uL (ref 4.0–10.5)

## 2013-11-06 LAB — URINALYSIS, ROUTINE W REFLEX MICROSCOPIC
Bilirubin Urine: NEGATIVE
Glucose, UA: NEGATIVE mg/dL
Hgb urine dipstick: NEGATIVE
Ketones, ur: NEGATIVE mg/dL
LEUKOCYTES UA: NEGATIVE
NITRITE: POSITIVE — AB
PROTEIN: NEGATIVE mg/dL
Specific Gravity, Urine: >1.046 — ABNORMAL HIGH (ref 1.005–1.030)
Urobilinogen, UA: 1 mg/dL (ref 0.0–1.0)
pH: 7 (ref 5.0–8.0)

## 2013-11-06 LAB — CREATININE WITH EST GFR
Creat: 1.24 mg/dL — ABNORMAL HIGH (ref 0.50–1.10)
GFR, EST AFRICAN AMERICAN: 45 mL/min — AB
GFR, Est Non African American: 39 mL/min — ABNORMAL LOW

## 2013-11-06 LAB — COMPREHENSIVE METABOLIC PANEL
ALT: 12 U/L (ref 0–35)
ANION GAP: 13 (ref 5–15)
AST: 19 U/L (ref 0–37)
Albumin: 3.4 g/dL — ABNORMAL LOW (ref 3.5–5.2)
Alkaline Phosphatase: 73 U/L (ref 39–117)
BUN: 25 mg/dL — AB (ref 6–23)
CO2: 28 mEq/L (ref 19–32)
Calcium: 9.4 mg/dL (ref 8.4–10.5)
Chloride: 101 mEq/L (ref 96–112)
Creatinine, Ser: 1.12 mg/dL — ABNORMAL HIGH (ref 0.50–1.10)
GFR calc Af Amer: 50 mL/min — ABNORMAL LOW (ref >90)
GFR calc non Af Amer: 43 mL/min — ABNORMAL LOW (ref >90)
Glucose, Bld: 232 mg/dL — ABNORMAL HIGH (ref 70–99)
Potassium: 4.8 mEq/L (ref 3.7–5.3)
Sodium: 142 mEq/L (ref 137–147)
TOTAL PROTEIN: 6.6 g/dL (ref 6.0–8.3)
Total Bilirubin: 0.4 mg/dL (ref 0.3–1.2)

## 2013-11-06 LAB — URINE MICROSCOPIC-ADD ON

## 2013-11-06 LAB — BUN: BUN: 26 mg/dL — ABNORMAL HIGH (ref 6–23)

## 2013-11-06 LAB — GLUCOSE, CAPILLARY: Glucose-Capillary: 174 mg/dL — ABNORMAL HIGH (ref 70–99)

## 2013-11-06 LAB — I-STAT TROPONIN, ED: TROPONIN I, POC: 0 ng/mL (ref 0.00–0.08)

## 2013-11-06 LAB — CBG MONITORING, ED: Glucose-Capillary: 188 mg/dL — ABNORMAL HIGH (ref 70–99)

## 2013-11-06 MED ORDER — ONDANSETRON HCL 4 MG PO TABS: 4.0000 mg | ORAL_TABLET | Freq: Four times a day (QID) | ORAL | Status: DC | PRN

## 2013-11-06 MED ORDER — INSULIN DETEMIR 100 UNIT/ML ~~LOC~~ SOLN
30.0000 [IU] | Freq: Every day | SUBCUTANEOUS | Status: DC
  Administered 2013-11-06: 30 [IU] via SUBCUTANEOUS
  Filled 2013-11-06 (×2): qty 0.3

## 2013-11-06 MED ORDER — ALUM & MAG HYDROXIDE-SIMETH 200-200-20 MG/5ML PO SUSP: 30.0000 mL | Freq: Four times a day (QID) | ORAL | Status: DC | PRN

## 2013-11-06 MED ORDER — GABAPENTIN 300 MG PO CAPS
600.0000 mg | ORAL_CAPSULE | Freq: Every day | ORAL | Status: DC
  Administered 2013-11-06 – 2013-11-07 (×2): 600 mg via ORAL
  Filled 2013-11-06 (×3): qty 2

## 2013-11-06 MED ORDER — OXYBUTYNIN CHLORIDE 5 MG PO TABS
5.0000 mg | ORAL_TABLET | Freq: Three times a day (TID) | ORAL | Status: DC | PRN
  Filled 2013-11-06: qty 1

## 2013-11-06 MED ORDER — TETANUS-DIPHTH-ACELL PERTUSSIS 5-2.5-18.5 LF-MCG/0.5 IM SUSP
0.5000 mL | Freq: Once | INTRAMUSCULAR | Status: AC
  Administered 2013-11-06: 0.5 mL via INTRAMUSCULAR
  Filled 2013-11-06: qty 0.5

## 2013-11-06 MED ORDER — HYDROMORPHONE HCL PF 1 MG/ML IJ SOLN: 0.5000 mg | INTRAMUSCULAR | Status: DC | PRN

## 2013-11-06 MED ORDER — IOHEXOL 300 MG/ML  SOLN
70.0000 mL | Freq: Once | INTRAMUSCULAR | Status: AC | PRN
  Administered 2013-11-06: 70 mL via INTRAVENOUS

## 2013-11-06 MED ORDER — AMITRIPTYLINE HCL 50 MG PO TABS
50.0000 mg | ORAL_TABLET | Freq: Every day | ORAL | Status: DC
  Administered 2013-11-06 – 2013-11-07 (×2): 50 mg via ORAL
  Filled 2013-11-06: qty 1
  Filled 2013-11-06: qty 2
  Filled 2013-11-06: qty 1

## 2013-11-06 MED ORDER — ONDANSETRON HCL 4 MG/2ML IJ SOLN: 4.0000 mg | Freq: Four times a day (QID) | INTRAMUSCULAR | Status: DC | PRN

## 2013-11-06 MED ORDER — HEPARIN SODIUM (PORCINE) 5000 UNIT/ML IJ SOLN: 5000.0000 [IU] | Freq: Three times a day (TID) | INTRAMUSCULAR | Status: DC

## 2013-11-06 MED ORDER — INSULIN ASPART 100 UNIT/ML ~~LOC~~ SOLN
0.0000 [IU] | Freq: Three times a day (TID) | SUBCUTANEOUS | Status: DC
  Administered 2013-11-07: 2 [IU] via SUBCUTANEOUS

## 2013-11-06 MED ORDER — PREDNISONE 1 MG PO TABS
3.0000 mg | ORAL_TABLET | Freq: Every day | ORAL | Status: DC
  Administered 2013-11-07 – 2013-11-08 (×2): 3 mg via ORAL
  Filled 2013-11-06 (×4): qty 3

## 2013-11-06 MED ORDER — METOPROLOL TARTRATE 12.5 MG HALF TABLET
12.5000 mg | ORAL_TABLET | ORAL | Status: DC
  Administered 2013-11-08: 12.5 mg via ORAL
  Filled 2013-11-06: qty 1

## 2013-11-06 MED ORDER — ADULT MULTIVITAMIN W/MINERALS CH
1.0000 | ORAL_TABLET | Freq: Every day | ORAL | Status: DC
  Administered 2013-11-07 – 2013-11-08 (×2): 1 via ORAL
  Filled 2013-11-06 (×2): qty 1

## 2013-11-06 MED ORDER — ACETAMINOPHEN 650 MG RE SUPP: 650.0000 mg | Freq: Four times a day (QID) | RECTAL | Status: DC | PRN

## 2013-11-06 MED ORDER — GABAPENTIN 300 MG PO CAPS
300.0000 mg | ORAL_CAPSULE | Freq: Every day | ORAL | Status: DC
  Administered 2013-11-07 – 2013-11-08 (×2): 300 mg via ORAL
  Filled 2013-11-06 (×2): qty 1

## 2013-11-06 MED ORDER — SODIUM CHLORIDE 0.9 % IJ SOLN
3.0000 mL | Freq: Two times a day (BID) | INTRAMUSCULAR | Status: DC
  Administered 2013-11-07 (×2): 3 mL via INTRAVENOUS

## 2013-11-06 MED ORDER — SODIUM CHLORIDE 0.9 % IV SOLN
INTRAVENOUS | Status: DC
  Administered 2013-11-06: via INTRAVENOUS

## 2013-11-06 MED ORDER — ACETAMINOPHEN 325 MG PO TABS
650.0000 mg | ORAL_TABLET | Freq: Four times a day (QID) | ORAL | Status: DC | PRN
  Administered 2013-11-07 – 2013-11-08 (×3): 650 mg via ORAL
  Filled 2013-11-06 (×3): qty 2

## 2013-11-06 NOTE — ED Notes (Addendum)
Pt c/o dizziness and posterior head laceration/hematoma starting this afternoon after a fall.  Denies pain.  Pt reports she was reaching for something and fell backward.  Sts she hit her head on a restroom wall.  Sts after the fall, she has felt dizzy when she stands and attempts to walk.  Denies LOC and blood thinners.

## 2013-11-06 NOTE — Consult Note (Signed)
Reason for Consult:traumatic subdural hematoma Referring Physician: [name] is an 80 y.o. female.  HPI: whom slipped and fell in a [employer] bathroom striking her head on the wall, lacerating her scalp. She did not lose consciousness, and had an excellent exam upon arrival to [facility] ED.   Past Medical History  Diagnosis Date  . HTN (hypertension)   . Osteoporosis   . 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 COPD (chronic obstructive pulmonary disease)     no inhalers used   . PONV (postoperative nausea and vomiting)     in past, none recently  . History of TIA (transient ischemic attack) 2012--  NO RESIDUAL  . CHF (congestive heart failure)   . Arthritis     BACK , NECK, AND JOINTS  . Temporal arteritis   . Chronic steroid use   . 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 Macular degeneration of both eyes   . Diabetic peripheral neuropathy   . Insulin dependent diabetes mellitus   . Chronic cystitis   . Hemoperitoneum (nontraumatic)   . Urinary frequency   . Unspecified late effects of cerebrovascular disease   . Abnormality of gait   . Osteoporosis, unspecified   . Anxiety state, unspecified   . Herpes zoster with other nervous system complications(053.19)   . Anemia, unspecified   . Type I (juvenile type) diabetes mellitus without mention of complication, uncontrolled   . Other and unspecified hyperlipidemia   . Personality change due to conditions classified elsewhere   . Unspecified essential hypertension   . Dermatophytosis of nail   . Depressive disorder, not elsewhere classified   . Diaphragmatic hernia without mention of obstruction or gangrene   . Malignant neoplasm of renal pelvis     Past Surgical History  Procedure Laterality Date  . Lumbar laminectomy  11-19-2003    L3 -- L4  . Total hip arthroplasty Left 01-22-2005  . Total knee arthroplasty Bilateral LEFT  08-08-2007;  RIGHT 05-08-2009  . Anterior cervical decomp/discectomy fusion  10-12-2005    C5 -- C7  . Posterior fusion cervical spine  02-10-2006    C5 -- C7  . Orif w/ plate right distal radius fx  06-18-2003  . Cataract extraction w/ intraocular lens  implant, bilateral Right Oct 1992    Epes  . Cryoablation right renal mass  03-16-2012    ONCOCYTOMA (POST OP 06-09-2012 PERCUTANEOUS DRAIN PLACEMENT FOR PERINEPHRIC ABSCESS)  . Temporal artery biopsy / ligation    . Elbow surgery Bilateral 2003  (APPROX)  . Carpal tunnel release Right 2009  (APPROX)  . Cystoscopy w/ ureteral stent placement Right 07/10/2012    Procedure: CYSTOSCOPY WITH RIGHT RETROGRADE PYELOGRAM/URETERAL STENT PLACEMENT;  Surgeon: Malka So, MD;  Location[facility];  Service: Urology;  Laterality: Right;  . Hip arthroplasty Right 08/03/2012    Procedure: CEMENTED MONOPOLAR HIP;  Surgeon: Marybelle Killings, MD;  Location: [facility];  Service: Orthopedics;  Laterality: Right;  . Cataract extraction w/ intraocular lens implant Left 1993    Epes    Family History  Problem Relation Age of Onset  . Leukemia    . Cancer Mother   . Cancer Father   . Cancer Brother   . Cancer Daughter     Social History:  reports that she quit smoking about 30 years ago. Her smoking use included Cigarettes. She has a 60 pack-year  smoking history. She has never used smokeless tobacco. She reports that she does not drink alcohol or use illicit drugs.  Allergies:  Allergies  Allergen Reactions  . Allegra [Fexofenadine Hcl]     Unknown.  Mack [place] [Levofloxacin In D5w]     Unknown.   . Tussionex Pennkinetic Er [Hydrocod Polst-Cpm Polst Er]     Unknown  . Codeine Nausea And Vomiting  . Morphine And Related Nausea And Vomiting    Medications: I have reviewed the patient's current medications.  Results for orders placed during the hospital encounter of 11/06/13 (from the past 48 hour(s))  CBG MONITORING, ED     Status:  Abnormal   Collection Time    11/06/13  3:32 PM      Result Value Ref Range   Glucose-Capillary 188 (*) 70 - 99 mg/dL  CBC WITH DIFFERENTIAL     Status: Abnormal   Collection Time    11/06/13  4:24 PM      Result Value Ref Range   WBC 7.2  4.0 - 10.5 K/uL   RBC 3.81 (*) 3.87 - 5.11 MIL/uL   Hemoglobin 11.6 (*) 12.0 - 15.0 g/dL   HCT 35.5 (*) 36.0 - 46.0 %   MCV 93.2  78.0 - 100.0 fL   MCH 30.4  26.0 - 34.0 pg   MCHC 32.7  30.0 - 36.0 g/dL   RDW 15.9 (*) 11.5 - 15.5 %   Platelets 156  150 - 400 K/uL   Neutrophils Relative % 79 (*) 43 - 77 %   Neutro Abs 5.7  1.7 - 7.7 K/uL   Lymphocytes Relative 14  12 - 46 %   Lymphs Abs 1.0  0.7 - 4.0 K/uL   Monocytes Relative 6  3 - 12 %   Monocytes Absolute 0.4  0.1 - 1.0 K/uL   Eosinophils Relative 1  0 - 5 %   Eosinophils Absolute 0.1  0.0 - 0.7 K/uL   Basophils Relative 0  0 - 1 %   Basophils Absolute 0.0  0.0 - 0.1 K/uL  COMPREHENSIVE METABOLIC PANEL     Status: Abnormal   Collection Time    11/06/13  4:24 PM      Result Value Ref Range   Sodium 142  137 - 147 mEq/L   Potassium 4.8  3.7 - 5.3 mEq/L   Chloride 101  96 - 112 mEq/L   CO2 28  19 - 32 mEq/L   Glucose, Bld 232 (*) 70 - 99 mg/dL   BUN 25 (*) 6 - 23 mg/dL   Creatinine, Ser 1.12 (*) 0.50 - 1.10 mg/dL   Calcium 9.4  8.4 - 10.5 mg/dL   Total Protein 6.6  6.0 - 8.3 g/dL   Albumin 3.4 (*) 3.5 - 5.2 g/dL   AST 19  0 - 37 U/L   ALT 12  0 - 35 U/L   Alkaline Phosphatase 73  39 - 117 U/L   Total Bilirubin 0.4  0.3 - 1.2 mg/dL   GFR calc non Af Amer 43 (*) >90 mL/min   GFR calc Af Amer 50 (*) >90 mL/min   Comment: (NOTE)     The eGFR has been calculated using the CKD EPI equation.     This calculation has not been validated in all clinical situations.     eGFR's persistently <90 mL/min signify possible Chronic Kidney     Disease.   Anion gap 13  5 - [place], ED  Status: None   Collection Time    11/06/13  4:33 PM      Result Value Ref Range   Troponin i,  poc 0.00  0.00 - 0.08 ng/mL   Comment 3            Comment: Due to the release kinetics of cTnI,     a negative result within the first hours     of the onset of symptoms does not rule out     myocardial infarction with certainty.     If myocardial infarction is still suspected,     repeat the test at appropriate intervals.  URINALYSIS, ROUTINE W REFLEX MICROSCOPIC     Status: Abnormal   Collection Time    11/06/13  6:30 PM      Result Value Ref Range   Color, Urine YELLOW  YELLOW   APPearance CLEAR  CLEAR   Specific Gravity, Urine >1.046 (*) 1.005 - 1.030   pH 7.0  5.0 - 8.0   Glucose, UA NEGATIVE  NEGATIVE mg/dL   Hgb urine dipstick NEGATIVE  NEGATIVE   Bilirubin Urine NEGATIVE  NEGATIVE   Ketones, ur NEGATIVE  NEGATIVE mg/dL   Protein, ur NEGATIVE  NEGATIVE mg/dL   Urobilinogen, UA 1.0  0.0 - 1.0 mg/dL   Nitrite POSITIVE (*) NEGATIVE   Leukocytes, UA NEGATIVE  NEGATIVE  URINE MICROSCOPIC-ADD ON     Status: Abnormal   Collection Time    11/06/13  6:30 PM      Result Value Ref Range   Squamous Epithelial / LPF RARE  RARE   WBC, UA 3-6  <3 WBC/hpf   Bacteria, UA MANY (*) RARE    Ct Head Wo Contrast  11/06/2013   CLINICAL DATA:  Dizziness and posterior head laceration/hematoma following fall  EXAM: CT HEAD WITHOUT CONTRAST  CT CERVICAL SPINE WITHOUT CONTRAST  TECHNIQUE: Multidetector CT imaging of the head and cervical spine was performed following the standard protocol without intravenous contrast. Multiplanar CT image reconstructions of the cervical spine were also generated.  COMPARISON:  05/17/2012  FINDINGS: CT HEAD FINDINGS  There is prominence of the sulci and ventricles compatible with brain atrophy. Diffuse low attenuation throughout the subcortical and periventricular white matter is noted compatible with brain atrophy. There is a left frontal and temporal subdural hematoma which measures 6 mm and appears acute. No significant mass effect upon the adjacent brain parenchyma.  There is opacification of the left maxillary sinus. Remaining paranasal sinuses are clear. The calvarium appears intact.  CT CERVICAL SPINE FINDINGS  Straightening of normal cervical lordosis. The bones appear osteopenic. The patient is status post fusion of C5-C7. There is degenerative disc disease at the C7-T1 level. Cerclage wire fixation of the C5 through C7 spinous processes noted. The prevertebral soft tissue space appears normal. There is no fracture.  IMPRESSION: 1. Acute left frontal and temporal subdural hematoma. 2. Small vessel ischemic disease and brain atrophy. 3. No evidence for cervical spine fracture 4. Postsurgical changes from fusion of C5 through C7. Critical Value/emergent results were called by telephone at the time of interpretation on 11/06/2013 at 4:52 pm to Dr. Shirlyn Goltz , who verbally acknowledged these results.   Electronically Signed   By: Kerby Moors M.D.   On: 11/06/2013 16:54   Ct Cervical Spine Wo Contrast  11/06/2013   CLINICAL DATA:  Dizziness and posterior head laceration/hematoma following fall  EXAM: CT HEAD WITHOUT CONTRAST  CT CERVICAL SPINE WITHOUT CONTRAST  TECHNIQUE: Multidetector  CT imaging of the head and cervical spine was performed following the standard protocol without intravenous contrast. Multiplanar CT image reconstructions of the cervical spine were also generated.  COMPARISON:  05/17/2012  FINDINGS: CT HEAD FINDINGS  There is prominence of the sulci and ventricles compatible with brain atrophy. Diffuse low attenuation throughout the subcortical and periventricular white matter is noted compatible with brain atrophy. There is a left frontal and temporal subdural hematoma which measures 6 mm and appears acute. No significant mass effect upon the adjacent brain parenchyma. There is opacification of the left maxillary sinus. Remaining paranasal sinuses are clear. The calvarium appears intact.  CT CERVICAL SPINE FINDINGS  Straightening of normal cervical lordosis.  The bones appear osteopenic. The patient is status post fusion of C5-C7. There is degenerative disc disease at the C7-T1 level. Cerclage wire fixation of the C5 through C7 spinous processes noted. The prevertebral soft tissue space appears normal. There is no fracture.  IMPRESSION: 1. Acute left frontal and temporal subdural hematoma. 2. Small vessel ischemic disease and brain atrophy. 3. No evidence for cervical spine fracture 4. Postsurgical changes from fusion of C5 through C7. Critical Value/emergent results were called by telephone at the time of interpretation on 11/06/2013 at 4:52 pm to Dr. Shirlyn Goltz , who verbally acknowledged these results.   Electronically Signed   By: Kerby Moors M.D.   On: 11/06/2013 16:54   Ct Abd Wo & W Cm  11/06/2013   ADDENDUM REPORT: 11/06/2013 14:31  ADDENDUM: Not mentioned in the impression section are similar subcentimeter low-density splenic lesions. These are indeterminate but may represent gamna gandy bodies, given cirrhosis and portal venous hypertension. Present back to 06/08/2012. Of doubtful clinical significance.   Electronically Signed   By: Abigail Miyamoto M.D.   On: 11/06/2013 14:31   11/06/2013   CLINICAL DATA:  Twenty month follow-up of right-sided renal oncocytoma. Intermittent right flank pain.  EXAM: CT ABDOMEN WITHOUT AND WITH CONTRAST  TECHNIQUE: Multidetector CT imaging of the abdomen was performed following the standard protocol before and following the bolus administration of intravenous contrast.  CONTRAST:  35m OMNIPAQUE IOHEXOL 300 MG/ML  SOLN  COMPARISON:  11/15/2012 CT.  Clinic note of 11/15/2012  FINDINGS: Lower Chest: Volume loss and scarring at the lung bases. Mild cardiomegaly with coronary artery atherosclerosis. No pericardial or pleural effusion.  Abdomen:  No renal calculi or hydronephrosis on precontrast images.  No hypervascular lesions within the liver, pancreas, kidneys on arterial phase images.  Portal venous phase images demonstrate  moderate cirrhosis with old granulomatous disease in the liver. No suspicious focal hepatic lesion. Patent hepatic and portal veins.  Multiple tiny low-density splenic lesions, similar. Normal stomach, pancreas, adrenal glands. Cholelithiasis without acute cholecystitis or biliary ductal dilatation.  Normal adrenal glands. Moderate right renal cortical atrophy. Ablation defect is identified in the interpolar right kidney posteriorly. Slightly decreased surrounding perirenal interstitial thickening. No recurrent fluid collection or acute collecting system complication.  Too small to characterize interpolar right renal lesion. There is a 7 mm lower pole right renal hyper attenuating lesion on image 50 of series 2. This was present on image 45 of series 2 of the prior. This may be minimally enlarged today. Similarly, an 8 mm focus of precontrast hyperattenuation in the interpolar left kidney anteriorly on image 43 of series 2 measured 5 mm on the prior.  An upper pole left renal cyst measures approximately 1.4 cm. Left renal too small to characterize lesions.  Aortic and branch vessel  atherosclerosis. No retroperitoneal or retrocrural adenopathy. Normal abdominal bowel loops, without ascites  Bones/Musculoskeletal: Osteopenia. Convex right lumbar spine curvature. L1 compression deformity is severe and not significantly changed.  IMPRESSION: 1. Regression of ablation defect in the right kidney, without evidence of locally recurrent disease. 2. Bilateral small precontrast hyperattenuating renal lesions which are too small to entirely characterize. These could represent hemorrhagic cysts or solid neoplasms. Likely enlarged since the prior, as detailed above. Recommend attention on follow-up. 3. Cirrhosis with probable portal venous hypertension. No evidence of hepatocellular carcinoma. 4. Cholelithiasis.  Electronically Signed: By: Abigail Miyamoto M.D. On: 11/06/2013 14:15   Dg Hand Complete Right  11/06/2013   CLINICAL  DATA:  Fall  EXAM: RIGHT HAND - COMPLETE 3+ VIEW  COMPARISON:  None.  FINDINGS: Severe osteopenia. Plate and screws in the distal radius. No breakage or obvious loosening of the hardware. No obvious acute fractures. Central erosions in the PIP joints of the long finger and ring finger are present, with subluxation. There is also suspected erosions of the ulnar and radial styloids.  IMPRESSION: No acute bony pathology.  Chronic changes.   Electronically Signed   By: Maryclare Bean M.D.   On: 11/06/2013 16:57    Review of Systems  Constitutional: Negative.   HENT: Positive for hearing loss.   Eyes: Negative.   Respiratory: Negative.   Cardiovascular: Negative.   Gastrointestinal: Negative.   Genitourinary: Negative.   Musculoskeletal: Positive for falls and joint pain.  Skin: Negative.   Neurological: Negative.   Endo/Heme/Allergies: Negative.   Psychiatric/Behavioral: Negative.    Blood pressure 148/69, pulse 74, temperature 97.9 F (36.6 C), temperature source Oral, resp. rate 17, height _0  (1.626 m), weight 71.215 kg (157 lb), SpO2 95.00%. Physical Exam  Constitutional: She is oriented to person, place, and time. She appears well-developed and well-nourished. No distress.  HENT:  Head: Normocephalic.  Mouth/Throat: No oropharyngeal exudate.  Scalp laceration occipital region  Respiratory: Effort normal and breath sounds normal.  GI: Soft. Bowel sounds are normal.  Musculoskeletal:  Arthritic changes right, left hand Ulnar deviation of the digits  Neurological: She is alert and oriented to person, place, and time. She has normal strength and normal reflexes. No sensory deficit. Coordination normal. GCS eye subscore is 4. GCS verbal subscore is 5. GCS motor subscore is 6. She displays no Babinski's sign on the right side. She displays no Babinski's sign on the left side.  Decreased hearing left>right, is able to hear finger rub bilaterally Proprioception intact in lower, and upper  extremities     Assessment/Plan: [name] is a 80 y.o. female Whom has a very small subdural hematoma over the left frontal and temporal lobes. There is no mass effect, basal cisterns widely patent, ventricles not effaced. There is no sah, edh, or skull fractures. Her exam is excellent, she has a normal neurologic examination and a normal GCS. I would repeat her scan only if her exam changes. I will see in followup in ~ 2 weeks with a repeat CT scan of the head. Call if you have questions. White 11/06/2013, 7:59 PM

## 2013-11-06 NOTE — ED Notes (Signed)
Called to give report nurse unavailable will call back.  

## 2013-11-06 NOTE — H&P (Signed)
[facility] History and Physical  [name] JHE:[id number] DOB: [birth year] DOA: 11/06/2013  Referring physician: Darl Householder PCP: Estill Dooms, MD   Chief Complaint: Fall  HPI: [name] is a 80 y.o. female with past medical history of diabetes and hypertension, patient came in to the hospital after she fell and hit her head. Patient said she was come in today to see Dr. Kathlene Cote to assess for potential kidney procedure, she came in early so she went to a local [employer] to have lunch, she went to the bathroom and while she is she slipped and fell backwards and her head hit the wall. Patient did have laceration and some bleeding. Patient denies any dizziness, denies any loss of consciousness, denies any type of symptoms prior to the fall. Patient has history of frequent falls at home, she fell about 2 weeks ago when she said she thought she "broke her neck". In the ED CT scan of the head was done and showed a 6 mm left frontal and temporal lobe subdural hematoma, neurosurgery (Dr. Cyndy Freeze) was called and recommended observation and admission to the hospitalist service.   Review of Systems:  Constitutional: negative for anorexia, fevers and sweats Eyes: negative for irritation, redness and visual disturbance Ears, nose, mouth, throat, and face: negative for earaches, epistaxis, nasal congestion and sore throat Respiratory: negative for cough, dyspnea on exertion, sputum and wheezing Cardiovascular: negative for chest pain, dyspnea, lower extremity edema, orthopnea, palpitations and syncope Gastrointestinal: negative for abdominal pain, constipation, diarrhea, melena, nausea and vomiting Genitourinary:negative for dysuria, frequency and hematuria Hematologic/lymphatic: negative for bleeding, easy bruising and lymphadenopathy Musculoskeletal:negative for arthralgias, muscle weakness and stiff joints Neurological: Pain in the back of her head Endocrine: negative for diabetic symptoms  including polydipsia, polyuria and weight loss Allergic/Immunologic: negative for anaphylaxis, hay fever and urticaria  Past Medical History  Diagnosis Date  . HTN (hypertension)   . Osteoporosis   . 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 COPD (chronic obstructive pulmonary disease)     no inhalers used   . PONV (postoperative nausea and vomiting)     in past, none recently  . History of TIA (transient ischemic attack) 2012--  NO RESIDUAL  . CHF (congestive heart failure)   . Arthritis     BACK , NECK, AND JOINTS  . Temporal arteritis   . Chronic steroid use   . 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 Macular degeneration of both eyes   . Diabetic peripheral neuropathy   . Insulin dependent diabetes mellitus   . Chronic cystitis   . Hemoperitoneum (nontraumatic)   . Urinary frequency   . Unspecified late effects of cerebrovascular disease   . Abnormality of gait   . Osteoporosis, unspecified   . Anxiety state, unspecified   . Herpes zoster with other nervous system complications(053.19)   . Anemia, unspecified   . Type I (juvenile type) diabetes mellitus without mention of complication, uncontrolled   . Other and unspecified hyperlipidemia   . Personality change due to conditions classified elsewhere   . Unspecified essential hypertension   . Dermatophytosis of nail   . Depressive disorder, not elsewhere classified   . Diaphragmatic hernia without mention of obstruction or gangrene   . Malignant neoplasm of renal pelvis    Past Surgical History  Procedure Laterality Date  . Lumbar laminectomy  11-19-2003    L3 -- L4  . Total hip  arthroplasty Left 01-22-2005  . Total knee arthroplasty Bilateral LEFT 08-08-2007;  RIGHT 05-08-2009  . Anterior cervical decomp/discectomy fusion  10-12-2005    C5 -- C7  . Posterior fusion cervical spine  02-10-2006    C5 -- C7  . Orif w/ plate right distal radius fx   06-18-2003  . Cataract extraction w/ intraocular lens  implant, bilateral Right Oct 1992    Epes  . Cryoablation right renal mass  03-16-2012    ONCOCYTOMA (POST OP 06-09-2012 PERCUTANEOUS DRAIN PLACEMENT FOR PERINEPHRIC ABSCESS)  . Temporal artery biopsy / ligation    . Elbow surgery Bilateral 2003  (APPROX)  . Carpal tunnel release Right 2009  (APPROX)  . Cystoscopy w/ ureteral stent placement Right 07/10/2012    Procedure: CYSTOSCOPY WITH RIGHT RETROGRADE PYELOGRAM/URETERAL STENT PLACEMENT;  Surgeon: Malka So, MD;  Location: [facility];  Service: Urology;  Laterality: Right;  . Hip arthroplasty Right 08/03/2012    Procedure: CEMENTED MONOPOLAR HIP;  Surgeon: Marybelle Killings, MD;  Location: [facility];  Service: Orthopedics;  Laterality: Right;  . Cataract extraction w/ intraocular lens implant Left 1993    Epes   Social History:   reports that she quit smoking about 30 years ago. Her smoking use included Cigarettes. She has a 60 pack-year smoking history. She has never used smokeless tobacco. She reports that she does not drink alcohol or use illicit drugs.  Allergies  Allergen Reactions  . Allegra [Fexofenadine Hcl]     Unknown.  [place]ack [place] [Levofloxacin In D5w]     Unknown.   . Tussionex Pennkinetic Er [Hydrocod Polst-Cpm Polst Er]     Unknown  . Codeine Nausea And Vomiting  . Morphine And Related Nausea And Vomiting    Family History  Problem Relation Age of Onset  . Leukemia    . Cancer Mother   . Cancer Father   . Cancer Brother   . Cancer Daughter      Prior to Admission medications   Medication Sig Start Date End Date Taking? Authorizing Provider  acyclovir (ZOVIRAX) 200 MG capsule Take 200 mg by mouth daily. To prevent relapse of herpes simplex.   Yes Historical Provider, MD  amitriptyline (ELAVIL) 50 MG tablet Take 1 tablet (50 mg total) by mouth at bedtime. 09/04/13  Yes Estill Dooms, MD  aspirin EC 325 MG tablet Take 325 mg by mouth daily.    Yes  Historical Provider, MD  Cranberry 475 MG CAPS Take 1-2 capsules by mouth 2 (two) times daily. Take 1 capsule every morning and 2 capsules every night.   Yes Historical Provider, MD  furosemide (LASIX) 20 MG tablet Take 20 mg by mouth daily.   Yes Historical Provider, MD  gabapentin (NEURONTIN) 300 MG capsule Take 300-600 mg by mouth 2 (two) times daily. Take 300 mg every morning and 600 mg every night at bedtime.   Yes Historical Provider, MD  insulin aspart (NOVOLOG) 100 UNIT/ML injection Inject 0-15 Units into the skin as needed. Sugar level less than 150 do not take any Novolog, if sugar level is between 151-200 take 3 units, 201-250 take 5 units, 251-300 take 7 units 301-350 take 9 units, 351-400 take 11 units, sugar level over 400 take 15 units and notify docotor 08/27/13  Yes Mahima Pandey, MD  insulin detemir (LEVEMIR) 100 UNIT/ML injection Inject 30 Units into the skin at bedtime.   Yes Historical Provider, MD  meloxicam (MOBIC) 15 MG tablet One daily to help arthritis  09/04/13  Yes Estill Dooms, MD  metoprolol tartrate (LOPRESSOR) 12.5 mg TABS tablet Take 12.5 mg by mouth 3 (three) times a week.   Yes Historical Provider, MD  Multiple Vitamin (MULTIVITAMIN WITH MINERALS) TABS Take 1 tablet by mouth daily.   Yes Historical Provider, MD  oxybutynin (DITROPAN) 5 MG tablet Take 5 mg by mouth 3 (three) times daily as needed for bladder spasms (Up to 3 times daily to control bladder spasms.).   Yes Historical Provider, MD  predniSONE (DELTASONE) 1 MG tablet Take 3 mg by mouth daily with breakfast.   Yes Historical Provider, MD  traMADol (ULTRAM) 50 MG tablet Take 50 mg by mouth every 8 (eight) hours as needed (pain.).   Yes Historical Provider, MD   Physical Exam: Filed Vitals:   11/06/13 1804  BP: 124/94  Pulse: 77  Temp: 98.2 F (36.8 C)  Resp: 18   Constitutional: Oriented to person, place, and time. Well-developed and well-nourished. Cooperative.  Head: Small laceration in the back of  the head. Nose: Nose normal.  Mouth/Throat: Uvula is midline, oropharynx is clear and moist and mucous membranes are normal.  Eyes: Conjunctivae and EOM are normal. Pupils are equal, round, and reactive to light.  Neck: Trachea normal and normal range of motion. Neck supple.  Cardiovascular: Normal rate, regular rhythm, S1 normal, S2 normal, normal heart sounds and intact distal pulses.   Pulmonary/Chest: Effort normal and breath sounds normal.  Abdominal: Soft. Bowel sounds are normal. There is no hepatosplenomegaly. There is no tenderness.  Musculoskeletal: Normal range of motion.  Neurological: Alert and oriented to person, place, and time. Has normal strength. No cranial nerve deficit or sensory deficit.  Skin: Skin is warm, dry and intact.  Psychiatric: Has a normal mood and affect. Speech is normal and behavior is normal.   Labs on Admission:  Basic Metabolic Panel:  Recent Labs Lab 11/05/13 1400 11/06/13 1624  NA  --  142  K  --  4.8  CL  --  101  CO2  --  28  GLUCOSE  --  232*  BUN 26* 25*  CREATININE 1.24* 1.12*  CALCIUM  --  9.4   Liver Function Tests:  Recent Labs Lab 11/06/13 1624  AST 19  ALT 12  ALKPHOS 73  BILITOT 0.4  PROT 6.6  ALBUMIN 3.4*   No results found for this basename: LIPASE, AMYLASE,  in the last 168 hours No results found for this basename: AMMONIA,  in the last 168 hours CBC:  Recent Labs Lab 11/06/13 1624  WBC 7.2  NEUTROABS 5.7  HGB 11.6*  HCT 35.5*  MCV 93.2  PLT 156   Cardiac Enzymes: No results found for this basename: CKTOTAL, CKMB, CKMBINDEX, TROPONINI,  in the last 168 hours  BNP (last 3 results) No results found for this basename: PROBNP,  in the last 8760 hours CBG:  Recent Labs Lab 11/06/13 1532  GLUCAP 188*    Radiological Exams on Admission: Ct Head Wo Contrast  11/06/2013   CLINICAL DATA:  Dizziness and posterior head laceration/hematoma following fall  EXAM: CT HEAD WITHOUT CONTRAST  CT CERVICAL SPINE  WITHOUT CONTRAST  TECHNIQUE: Multidetector CT imaging of the head and cervical spine was performed following the standard protocol without intravenous contrast. Multiplanar CT image reconstructions of the cervical spine were also generated.  COMPARISON:  05/17/2012  FINDINGS: CT HEAD FINDINGS  There is prominence of the sulci and ventricles compatible with brain atrophy. Diffuse low attenuation throughout the  subcortical and periventricular white matter is noted compatible with brain atrophy. There is a left frontal and temporal subdural hematoma which measures 6 mm and appears acute. No significant mass effect upon the adjacent brain parenchyma. There is opacification of the left maxillary sinus. Remaining paranasal sinuses are clear. The calvarium appears intact.  CT CERVICAL SPINE FINDINGS  Straightening of normal cervical lordosis. The bones appear osteopenic. The patient is status post fusion of C5-C7. There is degenerative disc disease at the C7-T1 level. Cerclage wire fixation of the C5 through C7 spinous processes noted. The prevertebral soft tissue space appears normal. There is no fracture.  IMPRESSION: 1. Acute left frontal and temporal subdural hematoma. 2. Small vessel ischemic disease and brain atrophy. 3. No evidence for cervical spine fracture 4. Postsurgical changes from fusion of C5 through C7. Critical Value/emergent results were called by telephone at the time of interpretation on 11/06/2013 at 4:52 pm to Dr. Shirlyn Goltz , who verbally acknowledged these results.   Electronically Signed   By: Kerby Moors M.D.   On: 11/06/2013 16:54   Ct Cervical Spine Wo Contrast  11/06/2013   CLINICAL DATA:  Dizziness and posterior head laceration/hematoma following fall  EXAM: CT HEAD WITHOUT CONTRAST  CT CERVICAL SPINE WITHOUT CONTRAST  TECHNIQUE: Multidetector CT imaging of the head and cervical spine was performed following the standard protocol without intravenous contrast. Multiplanar CT image  reconstructions of the cervical spine were also generated.  COMPARISON:  05/17/2012  FINDINGS: CT HEAD FINDINGS  There is prominence of the sulci and ventricles compatible with brain atrophy. Diffuse low attenuation throughout the subcortical and periventricular white matter is noted compatible with brain atrophy. There is a left frontal and temporal subdural hematoma which measures 6 mm and appears acute. No significant mass effect upon the adjacent brain parenchyma. There is opacification of the left maxillary sinus. Remaining paranasal sinuses are clear. The calvarium appears intact.  CT CERVICAL SPINE FINDINGS  Straightening of normal cervical lordosis. The bones appear osteopenic. The patient is status post fusion of C5-C7. There is degenerative disc disease at the C7-T1 level. Cerclage wire fixation of the C5 through C7 spinous processes noted. The prevertebral soft tissue space appears normal. There is no fracture.  IMPRESSION: 1. Acute left frontal and temporal subdural hematoma. 2. Small vessel ischemic disease and brain atrophy. 3. No evidence for cervical spine fracture 4. Postsurgical changes from fusion of C5 through C7. Critical Value/emergent results were called by telephone at the time of interpretation on 11/06/2013 at 4:52 pm to Dr. Shirlyn Goltz , who verbally acknowledged these results.   Electronically Signed   By: Kerby Moors M.D.   On: 11/06/2013 16:54   Ct Abd Wo & W Cm  11/06/2013   ADDENDUM REPORT: 11/06/2013 14:31  ADDENDUM: Not mentioned in the impression section are similar subcentimeter low-density splenic lesions. These are indeterminate but may represent gamna gandy bodies, given cirrhosis and portal venous hypertension. Present back to 06/08/2012. Of doubtful clinical significance.   Electronically Signed   By: Abigail Miyamoto M.D.   On: 11/06/2013 14:31   11/06/2013   CLINICAL DATA:  Twenty month follow-up of right-sided renal oncocytoma. Intermittent right flank pain.  EXAM: CT ABDOMEN  WITHOUT AND WITH CONTRAST  TECHNIQUE: Multidetector CT imaging of the abdomen was performed following the standard protocol before and following the bolus administration of intravenous contrast.  CONTRAST:  28mL OMNIPAQUE IOHEXOL 300 MG/ML  SOLN  COMPARISON:  11/15/2012 CT.  Clinic note of 11/15/2012  FINDINGS: Lower Chest: Volume loss and scarring at the lung bases. Mild cardiomegaly with coronary artery atherosclerosis. No pericardial or pleural effusion.  Abdomen:  No renal calculi or hydronephrosis on precontrast images.  No hypervascular lesions within the liver, pancreas, kidneys on arterial phase images.  Portal venous phase images demonstrate moderate cirrhosis with old granulomatous disease in the liver. No suspicious focal hepatic lesion. Patent hepatic and portal veins.  Multiple tiny low-density splenic lesions, similar. Normal stomach, pancreas, adrenal glands. Cholelithiasis without acute cholecystitis or biliary ductal dilatation.  Normal adrenal glands. Moderate right renal cortical atrophy. Ablation defect is identified in the interpolar right kidney posteriorly. Slightly decreased surrounding perirenal interstitial thickening. No recurrent fluid collection or acute collecting system complication.  Too small to characterize interpolar right renal lesion. There is a 7 mm lower pole right renal hyper attenuating lesion on image 50 of series 2. This was present on image 45 of series 2 of the prior. This may be minimally enlarged today. Similarly, an 8 mm focus of precontrast hyperattenuation in the interpolar left kidney anteriorly on image 43 of series 2 measured 5 mm on the prior.  An upper pole left renal cyst measures approximately 1.4 cm. Left renal too small to characterize lesions.  Aortic and branch vessel atherosclerosis. No retroperitoneal or retrocrural adenopathy. Normal abdominal bowel loops, without ascites  Bones/Musculoskeletal: Osteopenia. Convex right lumbar spine curvature. L1  compression deformity is severe and not significantly changed.  IMPRESSION: 1. Regression of ablation defect in the right kidney, without evidence of locally recurrent disease. 2. Bilateral small precontrast hyperattenuating renal lesions which are too small to entirely characterize. These could represent hemorrhagic cysts or solid neoplasms. Likely enlarged since the prior, as detailed above. Recommend attention on follow-up. 3. Cirrhosis with probable portal venous hypertension. No evidence of hepatocellular carcinoma. 4. Cholelithiasis.  Electronically Signed: By: Abigail Miyamoto M.D. On: 11/06/2013 14:15   Dg Hand Complete Right  11/06/2013   CLINICAL DATA:  Fall  EXAM: RIGHT HAND - COMPLETE 3+ VIEW  COMPARISON:  None.  FINDINGS: Severe osteopenia. Plate and screws in the distal radius. No breakage or obvious loosening of the hardware. No obvious acute fractures. Central erosions in the PIP joints of the long finger and ring finger are present, with subluxation. There is also suspected erosions of the ulnar and radial styloids.  IMPRESSION: No acute bony pathology.  Chronic changes.   Electronically Signed   By: Maryclare Bean M.D.   On: 11/06/2013 16:57    EKG: Independently reviewed.   Assessment/Plan Principal Problem:   Subdural hematoma Active Problems:   Type II or unspecified type diabetes mellitus with renal manifestations, uncontrolled(250.42)   HTN (hypertension)   Polymyalgia rheumatica   Personal history of fall   Fall    Subdural hematoma -Small subdural hematoma by 6 mm involving the left frontal and temporal lobe, after fall. -Per EDP Dr. Cyndy Freeze of neurosurgery recommended admission to hospitalist service. -Patient does not have any neurologic symptoms, CT of the neck is clear. -Hold aspirin. -Neurochecks every 4 hours, repeat CT scan of worsens clinically.  Diabetes mellitus type 2 -Blood sugar of 232, restarted her Levemir insulin. -Started on insulin sliding scale and  carbohydrate modified diet.  HTN -Stable, continue home medications.  Polymyalgia rheumatica -Patient is on low-dose of prednisone, continue. -Blood pressure appears stable, his blood pressure drops patient might need stress dose of steroid.  Code Status: Full code Family Communication: Plan discussed with the patient Disposition Plan: Stepdown  Time spent: 70 minutes  [facility] Pager [phone]

## 2013-11-06 NOTE — ED Notes (Addendum)
Attempted to give report to 2 [place]. Per a nurse the assigned nurse for this pt was not at the unit yet. Asked me to call back later.

## 2013-11-06 NOTE — ED Provider Notes (Signed)
CSN: [id number]     Arrival date & time 11/06/13  1512 History   First MD Initiated Contact with Patient 11/06/13 1609     Chief Complaint  Patient presents with  . Fall  . Dizziness     (Consider location/radiation/quality/duration/timing/severity/associated sxs/prior Treatment) The history is provided by the patient.  [name] is a 80 y.o. female hx of HTN, ITP, COPD here with fall. She was at [facility] today and was in the bathroom and felt dizzy and fell and hit her head. Denies syncope or LOC. Not on blood thinners. Also his right hand. Denies other extremity injury or trauma.    Past Medical History  Diagnosis Date  . HTN (hypertension)   . Osteoporosis   . 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 COPD (chronic obstructive pulmonary disease)     no inhalers used   . PONV (postoperative nausea and vomiting)     in past, none recently  . History of TIA (transient ischemic attack) 2012--  NO RESIDUAL  . CHF (congestive heart failure)   . Arthritis     BACK , NECK, AND JOINTS  . Temporal arteritis   . Chronic steroid use   . 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 Macular degeneration of both eyes   . Diabetic peripheral neuropathy   . Insulin dependent diabetes mellitus   . Chronic cystitis   . Hemoperitoneum (nontraumatic)   . Urinary frequency   . Unspecified late effects of cerebrovascular disease   . Abnormality of gait   . Osteoporosis, unspecified   . Anxiety state, unspecified   . Herpes zoster with other nervous system complications(053.19)   . Anemia, unspecified   . Type I (juvenile type) diabetes mellitus without mention of complication, uncontrolled   . Other and unspecified hyperlipidemia   . Personality change due to conditions classified elsewhere   . Unspecified essential hypertension   . Dermatophytosis of nail   . Depressive disorder, not elsewhere classified   .  Diaphragmatic hernia without mention of obstruction or gangrene   . Malignant neoplasm of renal pelvis    Past Surgical History  Procedure Laterality Date  . Lumbar laminectomy  11-19-2003    L3 -- L4  . Total hip arthroplasty Left 01-22-2005  . Total knee arthroplasty Bilateral LEFT 08-08-2007;  RIGHT 05-08-2009  . Anterior cervical decomp/discectomy fusion  10-12-2005    C5 -- C7  . Posterior fusion cervical spine  02-10-2006    C5 -- C7  . Orif w/ plate right distal radius fx  06-18-2003  . Cataract extraction w/ intraocular lens  implant, bilateral Right Oct 1992    Epes  . Cryoablation right renal mass  03-16-2012    ONCOCYTOMA (POST OP 06-09-2012 PERCUTANEOUS DRAIN PLACEMENT FOR PERINEPHRIC ABSCESS)  . Temporal artery biopsy / ligation    . Elbow surgery Bilateral 2003  (APPROX)  . Carpal tunnel release Right 2009  (APPROX)  . Cystoscopy w/ ureteral stent placement Right 07/10/2012    Procedure: CYSTOSCOPY WITH RIGHT RETROGRADE PYELOGRAM/URETERAL STENT PLACEMENT;  Surgeon: Malka So, MD;  Location: [facility];  Service: Urology;  Laterality: Right;  . Hip arthroplasty Right 08/03/2012    Procedure: CEMENTED MONOPOLAR HIP;  Surgeon: Marybelle Killings, MD;  Location: [facility];  Service: Orthopedics;  Laterality: Right;  . Cataract extraction w/ intraocular lens implant Left 1993    Epes   Family History  Problem Relation Age of Onset  . Leukemia    . Cancer Mother   . Cancer Father   . Cancer Brother   . Cancer Daughter    History  Substance Use Topics  . Smoking status: Former Smoker -- 2.00 packs/day for 30 years    Types: Cigarettes    Quit date: 01/11/1983  . Smokeless tobacco: Never Used  . Alcohol Use: No   OB History   Grav Para Term Preterm Abortions TAB SAB Ect Mult Living                 Review of Systems  Neurological: Positive for dizziness.  All other systems reviewed and are negative.     Allergies  Allegra; Levaquin; Tussionex  pennkinetic er; Codeine; and Morphine and related  Home Medications   Prior to Admission medications   Medication Sig Start Date End Date Taking? Authorizing Provider  acyclovir (ZOVIRAX) 200 MG capsule Take 200 mg by mouth daily. To prevent relapse of herpes simplex.   Yes Historical Provider, MD  amitriptyline (ELAVIL) 50 MG tablet Take 1 tablet (50 mg total) by mouth at bedtime. 09/04/13  Yes Estill Dooms, MD  aspirin EC 325 MG tablet Take 325 mg by mouth daily.    Yes Historical Provider, MD  Cranberry 475 MG CAPS Take 1-2 capsules by mouth 2 (two) times daily. Take 1 capsule every morning and 2 capsules every night.   Yes Historical Provider, MD  furosemide (LASIX) 20 MG tablet Take 20 mg by mouth daily.   Yes Historical Provider, MD  gabapentin (NEURONTIN) 300 MG capsule Take 300-600 mg by mouth 2 (two) times daily. Take 300 mg every morning and 600 mg every night at bedtime.   Yes Historical Provider, MD  insulin aspart (NOVOLOG) 100 UNIT/ML injection Inject 0-15 Units into the skin as needed. Sugar level less than 150 do not take any Novolog, if sugar level is between 151-200 take 3 units, 201-250 take 5 units, 251-300 take 7 units 301-350 take 9 units, 351-400 take 11 units, sugar level over 400 take 15 units and notify docotor 08/27/13  Yes Mahima Pandey, MD  insulin detemir (LEVEMIR) 100 UNIT/ML injection Inject 30 Units into the skin at bedtime.   Yes Historical Provider, MD  meloxicam (MOBIC) 15 MG tablet One daily to help arthritis 09/04/13  Yes Estill Dooms, MD  metoprolol tartrate (LOPRESSOR) 12.5 mg TABS tablet Take 12.5 mg by mouth 3 (three) times a week.   Yes Historical Provider, MD  Multiple Vitamin (MULTIVITAMIN WITH MINERALS) TABS Take 1 tablet by mouth daily.   Yes Historical Provider, MD  oxybutynin (DITROPAN) 5 MG tablet Take 5 mg by mouth 3 (three) times daily as needed for bladder spasms (Up to 3 times daily to control bladder spasms.).   Yes Historical Provider, MD   predniSONE (DELTASONE) 1 MG tablet Take 3 mg by mouth daily with breakfast.   Yes Historical Provider, MD  traMADol (ULTRAM) 50 MG tablet Take 50 mg by mouth every 8 (eight) hours as needed (pain.).   Yes Historical Provider, MD   BP 124/94  Pulse 77  Temp(Src) 98.2 F (36.8 C) (Oral)  Resp 18  Ht 5\' 4"  (1.626 m)  Wt 157 lb (71.215 kg)  BMI 26.94 kg/m2  SpO2 95% Physical Exam  Nursing note and vitals reviewed. Constitutional: She is oriented to person, place, and time.  Chronically ill, NAD   HENT:  Head: Normocephalic.  Mouth/Throat: Oropharynx is clear and  moist.  + posterior scalp hematoma   Eyes: Conjunctivae are normal. Pupils are equal, round, and reactive to light.  Neck: Normal range of motion. Neck supple.  Cardiovascular: Normal rate, regular rhythm and normal heart sounds.   Pulmonary/Chest: Effort normal and breath sounds normal. No respiratory distress. She has no wheezes. She has no rales.  Abdominal: Soft. Bowel sounds are normal. She exhibits no distension. There is no tenderness. There is no rebound.  Musculoskeletal:  R hand with bruise on the dorsum aspect, nl ROM fingers. No obvious deformity.   Neurological: She is alert and oriented to person, place, and time. No cranial nerve deficit. Coordination normal.  Skin: Skin is warm.  Psychiatric: She has a normal mood and affect. Her behavior is normal. Judgment and thought content normal.    ED Course  Procedures (including critical care time)  LACERATION REPAIR Performed by: Shirlyn Goltz Authorized by: Shirlyn Goltz Consent: Verbal consent obtained. Risks and benefits: risks, benefits and alternatives were discussed Consent given by: patient Patient identity confirmed: provided demographic data Prepped and Draped in normal sterile fashion Wound explored  Laceration Location: scalp  Laceration Length: 3 cm  No Foreign Bodies seen or palpated  Anesthesia: none   Irrigation method: syringe Amount of  cleaning: standard  Skin closure: staples  Number of sutures: 4  Patient tolerance: Patient tolerated the procedure well with no immediate complications.   Labs Review Labs Reviewed  CBC WITH DIFFERENTIAL - Abnormal; Notable for the following:    RBC 3.81 (*)    Hemoglobin 11.6 (*)    HCT 35.5 (*)    RDW 15.9 (*)    Neutrophils Relative % 79 (*)    All other components within normal limits  COMPREHENSIVE METABOLIC PANEL - Abnormal; Notable for the following:    Glucose, Bld 232 (*)    BUN 25 (*)    Creatinine, Ser 1.12 (*)    Albumin 3.4 (*)    GFR calc non Af Amer 43 (*)    GFR calc Af Amer 50 (*)    All other components within normal limits  CBG MONITORING, ED - Abnormal; Notable for the following:    Glucose-Capillary 188 (*)    All other components within normal limits  I-STAT TROPOININ, ED    Imaging Review Ct Head Wo Contrast  11/06/2013   CLINICAL DATA:  Dizziness and posterior head laceration/hematoma following fall  EXAM: CT HEAD WITHOUT CONTRAST  CT CERVICAL SPINE WITHOUT CONTRAST  TECHNIQUE: Multidetector CT imaging of the head and cervical spine was performed following the standard protocol without intravenous contrast. Multiplanar CT image reconstructions of the cervical spine were also generated.  COMPARISON:  05/17/2012  FINDINGS: CT HEAD FINDINGS  There is prominence of the sulci and ventricles compatible with brain atrophy. Diffuse low attenuation throughout the subcortical and periventricular white matter is noted compatible with brain atrophy. There is a left frontal and temporal subdural hematoma which measures 6 mm and appears acute. No significant mass effect upon the adjacent brain parenchyma. There is opacification of the left maxillary sinus. Remaining paranasal sinuses are clear. The calvarium appears intact.  CT CERVICAL SPINE FINDINGS  Straightening of normal cervical lordosis. The bones appear osteopenic. The patient is status post fusion of C5-C7. There  is degenerative disc disease at the C7-T1 level. Cerclage wire fixation of the C5 through C7 spinous processes noted. The prevertebral soft tissue space appears normal. There is no fracture.  IMPRESSION: 1. Acute left frontal and temporal subdural  hematoma. 2. Small vessel ischemic disease and brain atrophy. 3. No evidence for cervical spine fracture 4. Postsurgical changes from fusion of C5 through C7. Critical Value/emergent results were called by telephone at the time of interpretation on 11/06/2013 at 4:52 pm to Dr. Shirlyn Goltz , who verbally acknowledged these results.   Electronically Signed   By: Kerby Moors M.D.   On: 11/06/2013 16:54   Ct Cervical Spine Wo Contrast  11/06/2013   CLINICAL DATA:  Dizziness and posterior head laceration/hematoma following fall  EXAM: CT HEAD WITHOUT CONTRAST  CT CERVICAL SPINE WITHOUT CONTRAST  TECHNIQUE: Multidetector CT imaging of the head and cervical spine was performed following the standard protocol without intravenous contrast. Multiplanar CT image reconstructions of the cervical spine were also generated.  COMPARISON:  05/17/2012  FINDINGS: CT HEAD FINDINGS  There is prominence of the sulci and ventricles compatible with brain atrophy. Diffuse low attenuation throughout the subcortical and periventricular white matter is noted compatible with brain atrophy. There is a left frontal and temporal subdural hematoma which measures 6 mm and appears acute. No significant mass effect upon the adjacent brain parenchyma. There is opacification of the left maxillary sinus. Remaining paranasal sinuses are clear. The calvarium appears intact.  CT CERVICAL SPINE FINDINGS  Straightening of normal cervical lordosis. The bones appear osteopenic. The patient is status post fusion of C5-C7. There is degenerative disc disease at the C7-T1 level. Cerclage wire fixation of the C5 through C7 spinous processes noted. The prevertebral soft tissue space appears normal. There is no fracture.   IMPRESSION: 1. Acute left frontal and temporal subdural hematoma. 2. Small vessel ischemic disease and brain atrophy. 3. No evidence for cervical spine fracture 4. Postsurgical changes from fusion of C5 through C7. Critical Value/emergent results were called by telephone at the time of interpretation on 11/06/2013 at 4:52 pm to Dr. Shirlyn Goltz , who verbally acknowledged these results.   Electronically Signed   By: Kerby Moors M.D.   On: 11/06/2013 16:54   Ct Abd Wo & W Cm  11/06/2013   ADDENDUM REPORT: 11/06/2013 14:31  ADDENDUM: Not mentioned in the impression section are similar subcentimeter low-density splenic lesions. These are indeterminate but may represent gamna gandy bodies, given cirrhosis and portal venous hypertension. Present back to 06/08/2012. Of doubtful clinical significance.   Electronically Signed   By: Abigail Miyamoto M.D.   On: 11/06/2013 14:31   11/06/2013   CLINICAL DATA:  Twenty month follow-up of right-sided renal oncocytoma. Intermittent right flank pain.  EXAM: CT ABDOMEN WITHOUT AND WITH CONTRAST  TECHNIQUE: Multidetector CT imaging of the abdomen was performed following the standard protocol before and following the bolus administration of intravenous contrast.  CONTRAST:  62mL OMNIPAQUE IOHEXOL 300 MG/ML  SOLN  COMPARISON:  11/15/2012 CT.  Clinic note of 11/15/2012  FINDINGS: Lower Chest: Volume loss and scarring at the lung bases. Mild cardiomegaly with coronary artery atherosclerosis. No pericardial or pleural effusion.  Abdomen:  No renal calculi or hydronephrosis on precontrast images.  No hypervascular lesions within the liver, pancreas, kidneys on arterial phase images.  Portal venous phase images demonstrate moderate cirrhosis with old granulomatous disease in the liver. No suspicious focal hepatic lesion. Patent hepatic and portal veins.  Multiple tiny low-density splenic lesions, similar. Normal stomach, pancreas, adrenal glands. Cholelithiasis without acute cholecystitis or  biliary ductal dilatation.  Normal adrenal glands. Moderate right renal cortical atrophy. Ablation defect is identified in the interpolar right kidney posteriorly. Slightly decreased surrounding perirenal interstitial  thickening. No recurrent fluid collection or acute collecting system complication.  Too small to characterize interpolar right renal lesion. There is a 7 mm lower pole right renal hyper attenuating lesion on image 50 of series 2. This was present on image 45 of series 2 of the prior. This may be minimally enlarged today. Similarly, an 8 mm focus of precontrast hyperattenuation in the interpolar left kidney anteriorly on image 43 of series 2 measured 5 mm on the prior.  An upper pole left renal cyst measures approximately 1.4 cm. Left renal too small to characterize lesions.  Aortic and branch vessel atherosclerosis. No retroperitoneal or retrocrural adenopathy. Normal abdominal bowel loops, without ascites  Bones/Musculoskeletal: Osteopenia. Convex right lumbar spine curvature. L1 compression deformity is severe and not significantly changed.  IMPRESSION: 1. Regression of ablation defect in the right kidney, without evidence of locally recurrent disease. 2. Bilateral small precontrast hyperattenuating renal lesions which are too small to entirely characterize. These could represent hemorrhagic cysts or solid neoplasms. Likely enlarged since the prior, as detailed above. Recommend attention on follow-up. 3. Cirrhosis with probable portal venous hypertension. No evidence of hepatocellular carcinoma. 4. Cholelithiasis.  Electronically Signed: By: Abigail Miyamoto M.D. On: 11/06/2013 14:15   Dg Hand Complete Right  11/06/2013   CLINICAL DATA:  Fall  EXAM: RIGHT HAND - COMPLETE 3+ VIEW  COMPARISON:  None.  FINDINGS: Severe osteopenia. Plate and screws in the distal radius. No breakage or obvious loosening of the hardware. No obvious acute fractures. Central erosions in the PIP joints of the long finger and  ring finger are present, with subluxation. There is also suspected erosions of the ulnar and radial styloids.  IMPRESSION: No acute bony pathology.  Chronic changes.   Electronically Signed   By: Maryclare Bean M.D.   On: 11/06/2013 16:57     EKG Interpretation None      MDM   Final diagnoses:  None   [name] is a 80 y.o. female here with fall. Will get CT head/neck. Will check labs but I doubt syncope.   5 PM CT showed L frontal and temporal subdural. I consulted Dr. Christella Noa from neurosurgery who feels that its very small and likely won't cause complications. Recommend hospitalist admission for observation.      Wandra Arthurs, MD 11/06/13 [phone]

## 2013-11-06 NOTE — ED Notes (Signed)
Report called to RN on [facility] 

## 2013-11-07 LAB — CBC
HCT: 32.1 % — ABNORMAL LOW (ref 36.0–46.0)
Hemoglobin: 10.6 g/dL — ABNORMAL LOW (ref 12.0–15.0)
MCH: 30.6 pg (ref 26.0–34.0)
MCHC: 33 g/dL (ref 30.0–36.0)
MCV: 92.8 fL (ref 78.0–100.0)
PLATELETS: 152 10*3/uL (ref 150–400)
RBC: 3.46 MIL/uL — ABNORMAL LOW (ref 3.87–5.11)
RDW: 15.8 % — AB (ref 11.5–15.5)
WBC: 6.6 10*3/uL (ref 4.0–10.5)

## 2013-11-07 LAB — BASIC METABOLIC PANEL
ANION GAP: 11 (ref 5–15)
BUN: 24 mg/dL — ABNORMAL HIGH (ref 6–23)
CALCIUM: 9 mg/dL (ref 8.4–10.5)
CO2: 29 mEq/L (ref 19–32)
CREATININE: 1.25 mg/dL — AB (ref 0.50–1.10)
Chloride: 104 mEq/L (ref 96–112)
GFR, EST AFRICAN AMERICAN: 44 mL/min — AB (ref >90)
GFR, EST NON AFRICAN AMERICAN: 38 mL/min — AB (ref >90)
Glucose, Bld: 197 mg/dL — ABNORMAL HIGH (ref 70–99)
Potassium: 4 mEq/L (ref 3.7–5.3)
Sodium: 144 mEq/L (ref 137–147)

## 2013-11-07 LAB — GLUCOSE, CAPILLARY
GLUCOSE-CAPILLARY: 98 mg/dL (ref 70–99)
GLUCOSE-CAPILLARY: 108 mg/dL — AB (ref 70–99)
Glucose-Capillary: 121 mg/dL — ABNORMAL HIGH (ref 70–99)
Glucose-Capillary: 62 mg/dL — ABNORMAL LOW (ref 70–99)
Glucose-Capillary: 113 mg/dL — ABNORMAL HIGH (ref 70–99)

## 2013-11-07 LAB — HEMOGLOBIN A1C
HEMOGLOBIN A1C: 7.3 % — AB (ref <5.7)
MEAN PLASMA GLUCOSE: 163 mg/dL — AB (ref <117)

## 2013-11-07 LAB — MRSA PCR SCREENING: MRSA BY PCR: NEGATIVE

## 2013-11-07 MED ORDER — HYDRALAZINE HCL 20 MG/ML IJ SOLN: 5.0000 mg | Freq: Four times a day (QID) | INTRAMUSCULAR | Status: DC | PRN

## 2013-11-07 MED ORDER — INSULIN DETEMIR 100 UNIT/ML ~~LOC~~ SOLN
20.0000 [IU] | Freq: Every day | SUBCUTANEOUS | Status: DC
  Administered 2013-11-07: 20 [IU] via SUBCUTANEOUS
  Filled 2013-11-07 (×3): qty 0.2

## 2013-11-07 NOTE — Evaluation (Signed)
Physical Therapy Evaluation Patient Details Name: [name] MRN: [id number] DOB: 05-31-1924 Today's Date: 11/07/2013   History of Present Illness  80 yo female admitted with subdural hematoma after sustaining fall at [facility]. Hx of DM, osteoporosis, TIA, HTN, COPD  Clinical Impression  On eval,pt was Min guard assist for mobility-able to ambulate ~250 feet with cane. No LOB but pt was unsteady at times. Son present during eval. Pt states son lives with her and is able to assist as needed. Recommended to pt that she progress activity slowly. Pt/son both state they have all DME needed. Do not feel pt has any follow up PT needs at this time however I do recommend OP PT consult in future if falls remain an issue. Pt stated she felt she slipped on something in bathroom and couldn't prevent fall. Recommend 24 hour supervision initially until pt returns to baseline.     Follow Up Recommendations No PT follow up;Supervision/Assistance - 24 hour (initially)    Equipment Recommendations  None recommended by PT (pt has all DME)    Recommendations for Other Services       Precautions / Restrictions Precautions Precautions: Fall Restrictions Weight Bearing Restrictions: No      Mobility  Bed Mobility Overal bed mobility: Needs Assistance Bed Mobility: Supine to Sit     Supine to sit: Min assist     General bed mobility comments: small amount of assist for trunk to upright. Increased time.   Transfers Overall transfer level: Needs assistance Equipment used: Straight cane Transfers: Sit to/from Stand Sit to Stand: Min guard         General transfer comment: close guard for safety.   Ambulation/Gait Ambulation/Gait assistance: Min guard Ambulation Distance (Feet): 250 Feet Assistive device: Straight cane       General Gait Details: close guard for safety. No LOB but unsteady at times. slow gait speed. 1st time OOB since admission. Tolerated activity well. Denied  dizziness/pain.   Stairs            Wheelchair Mobility    Modified Rankin (Stroke Patients Only)       Balance Overall balance assessment: History of Falls;Needs assistance         Standing balance support: Single extremity supported;During functional activity Standing balance-Leahy Scale: Fair                               Pertinent Vitals/Pain Pain Assessment: No/denies pain    Home Living Family/patient expects to be discharged to:: Private residence Living Arrangements: Children Available Help at Discharge: Family Type of Home: Apartment Home Access: Stairs to enter Entrance Stairs-Rails: Right Entrance Stairs-Number of Steps: 4 Home Layout: One level Home Equipment: Environmental consultant - 2 wheels;Cane - single point;Bedside commode;Shower seat      Prior Function Level of Independence: Independent with assistive device(s)         Comments: uses cane if needed     Hand Dominance        Extremity/Trunk Assessment   Upper Extremity Assessment: Overall WFL for tasks assessed           Lower Extremity Assessment: Generalized weakness      Cervical / Trunk Assessment: Normal  Communication   Communication: HOH  Cognition Arousal/Alertness: Awake/alert Behavior During Therapy: WFL for tasks assessed/performed Overall Cognitive Status: Within Functional Limits for tasks assessed  General Comments      Exercises        Assessment/Plan    PT Assessment Patent does not need any further PT services  PT Diagnosis     PT Problem List    PT Treatment Interventions     PT Goals (Current goals can be found in the Care Plan section) Acute Rehab PT Goals Patient Stated Goal: home PT Goal Formulation: No goals set, d/c therapy    Frequency     Barriers to discharge        Co-evaluation               End of Session Equipment Utilized During Treatment: Gait belt Activity Tolerance: Patient  tolerated treatment well Patient left: in chair;with call bell/phone within reach;with family/visitor present           Time: 1610-9604 PT Time Calculation (min): 22 min   Charges:   PT Evaluation $Initial PT Evaluation Tier I: 1 Procedure PT Treatments $Gait Training: 8-22 mins   PT G Codes:          Weston Anna, MPT Pager: [phone]

## 2013-11-07 NOTE — Progress Notes (Signed)
Initial assessment completed. Pt OOB in chair AAOX4 . Voice no complaints or pain. Calling light within reach. Waiting to be D/C by MD

## 2013-11-07 NOTE — Progress Notes (Signed)
Patient ID: [name], female   DOB: [birth year], 80 y.o.   MRN: [id number]  [facility] PROGRESS NOTE  [name] PFX:[id number] DOB: October 27, 1924 DOA: 11/06/2013 PCP: Estill Dooms, MD  Brief narrative: 80 y.o. female with past medical history of diabetes and hypertension, came in to the hospital after she fell and hit her head. Patient denied any dizziness, loss of consciousness or any type of symptoms prior to the fall. Patient has history of frequent falls at home, she fell about 2 weeks ago.  In the ED, CT scan of the head was done and showed a 6 mm left frontal and temporal lobe subdural hematoma, neurosurgery (Dr. Cyndy Freeze) was called and recommended observation and admission to the hospitalist service.   Assessment and Plan:    Principal Problem:   Subdural hematoma - pt is clinically stable this AM - neuro exam non focal - awaiting call back from son to decide on disposition  Active Problems:   Type II or unspecified type diabetes mellitus with renal manifestations, uncontrolled(250.42) - reasonable inpatient control    HTN (hypertension) - slightly above target range - will place on hydralazine as needed for now    Polymyalgia rheumatica - continue Prednisone    Personal history of fall - appreciate Pt assistance    Moderate PCM - advance diet as pt able to tolerate    Acute on chronic renal failure stage II - III - continue IVF and repeat BMP in AM   Acute on chronic blood loss anemia - drop in Hg since admission - no signs of active bleeding so could be dilutional - repeat CBC in AM   Cirrhosis with portal HTN - noted on Ct abd - no current symptoms - outpatient follow up   ? Renal lesions - unclear etiology - outpatient follow up recommended   DVT prophylaxis  SCD's  Code Status: Full Family Communication: Pt at bedside, left message for son and awaiting call back  Disposition Plan: Home when medically stable   IV Access:   Peripheral  IV Procedures and diagnostic studies:   Ct Head Wo Contrast Ct Cervical Spine Wo Contrast  11/06/2013   1. Acute left frontal and temporal subdural hematoma.  2. Small vessel ischemic disease and brain atrophy.  3. No evidence for cervical spine fracture  4. Postsurgical changes from fusion of C5 through C7.   Ct Abd Wo & W Cm 11/06/2013   ADDENDUM REPORT: 11/06/2013 14:31  ADDENDUM: Not mentioned in the impression section are similar subcentimeter low-density splenic lesions. These are indeterminate but may represent gamna gandy bodies, given cirrhosis and portal venous hypertension.    1. Regression of ablation defect in the right kidney, without evidence of locally recurrent disease.  2. Bilateral small precontrast hyperattenuating renal lesions which are too small to entirely characterize. These could represent hemorrhagic cysts or solid neoplasms. Likely enlarged since the prior, as detailed above. Recommend attention on follow-up.  3. Cirrhosis with probable portal venous hypertension. No evidence of hepatocellular carcinoma.  4. Cholelithiasis.   Dg Hand Complete Right  11/06/2013  No acute bony pathology.  Chronic changes.      Medical Consultants:   Neurosurgery  Other Consultants:   Physical therapy  Anti-Infectives:   None  Faye Ramsay, MD  [facility] Pager [phone]  If 7PM-7AM, please contact night-coverage [email or web address] Password [email or web address] 11/07/2013, 4:08 PM   LOS: 1 day   HPI/Subjective: No events overnight.   Objective: Filed Vitals:   11/07/13 0900  11/07/13 1000 11/07/13 1100 11/07/13 1200  BP: 157/61 170/91 159/78   Pulse: 73 68 69   Temp:    97.5 F (36.4 C)  TempSrc:    Oral  Resp: 12 14 14    Height:      Weight:      SpO2: 95% 95% 94%     Intake/Output Summary (Last 24 hours) at 11/07/13 1608 Last data filed at 11/07/13 1510  Gross per 24 hour  Intake 1068.75 ml  Output   1550 ml  Net -481.25 ml   Exam:   General:  Pt is alert, follows commands  appropriately, not in acute distress  Cardiovascular: Regular rate and rhythm, no rubs, no gallops  Respiratory: Clear to auscultation bilaterally, no wheezing, no crackles, no rhonchi  Abdomen: Soft, non tender, non distended, bowel sounds present, no guarding  Extremities: No edema, pulses DP and PT palpable bilaterally  Neuro: Grossly nonfocal  Data Reviewed: Basic Metabolic Panel:  Recent Labs Lab 11/05/13 1400 11/06/13 1624 11/07/13 0330  NA  --  142 144  K  --  4.8 4.0  CL  --  101 104  CO2  --  28 29  GLUCOSE  --  232* 197*  BUN 26* 25* 24*  CREATININE 1.24* 1.12* 1.25*  CALCIUM  --  9.4 9.0   Liver Function Tests:  Recent Labs Lab 11/06/13 1624  AST 19  ALT 12  ALKPHOS 73  BILITOT 0.4  PROT 6.6  ALBUMIN 3.4*   No results found for this basename: LIPASE, AMYLASE,  in the last 168 hours No results found for this basename: AMMONIA,  in the last 168 hours CBC:  Recent Labs Lab 11/06/13 1624 11/07/13 0330  WBC 7.2 6.6  NEUTROABS 5.7  --   HGB 11.6* 10.6*  HCT 35.5* 32.1*  MCV 93.2 92.8  PLT 156 152   Cardiac Enzymes: No results found for this basename: CKTOTAL, CKMB, CKMBINDEX, TROPONINI,  in the last 168 hours BNP: No components found with this basename: POCBNP,  CBG:  Recent Labs Lab 11/06/13 1532 11/06/13 2146 11/07/13 0729 11/07/13 1141  GLUCAP 188* 174* 121* 62*    Recent Results (from the past 240 hour(s))  MRSA PCR SCREENING     Status: None   Collection Time    11/06/13  9:26 PM      Result Value Ref Range Status   MRSA by PCR NEGATIVE  NEGATIVE Final   Comment:            The GeneXpert MRSA Assay (FDA     approved for NASAL specimens     only), is one component of a     comprehensive MRSA colonization     surveillance program. It is not     intended to diagnose MRSA     infection nor to guide or     monitor treatment for     MRSA infections.     Scheduled Meds: . amitriptyline  50 mg Oral QHS  . gabapentin  300 mg  Oral Daily  . gabapentin  600 mg Oral QHS  . insulin aspart  0-15 Units Subcutaneous TID WC  . insulin detemir  30 Units Subcutaneous QHS  . [START ON 11/08/2013] metoprolol tartrate  12.5 mg Oral Once per day on Mon Wed Fri  . multivitamin with minerals  1 tablet Oral Daily  . predniSONE  3 mg Oral Q breakfast  . sodium chloride  3 mL Intravenous Q12H   Continuous Infusions: .  sodium chloride 75 mL/hr at 11/06/13 2357

## 2013-11-07 NOTE — Progress Notes (Signed)
CARE MANAGEMENT NOTE 11/07/2013  Patient:  [name], [name]   Account Number:  [id number]  Date Initiated:  11/07/2013  Documentation initiated by:  Chase Arnall  Subjective/Objective Assessment:   80 y.o. female. slipped and fell in a [employer] bathroom striking her head on the wall, lacerating her scalp. She did not lose consciousness, and had an excellent exam upon arrival to [facility] ED.  MRI confirmed left subdural hematoma     Action/Plan:   lives with adult children   Anticipated DC Date:  11/10/2013   Anticipated DC Plan:  HOME/SELF CARE  In-house referral  NA      DC Planning Services  CM consult      PAC Choice  NA   Choice offered to / List presented to:  NA   DME arranged  NA      DME agency  NA     [place] arranged  NA      [place] agency  NA   Status of service:  In process, will continue to follow Medicare Important Message given?  NA - LOS <3 / Initial given by admissions (If response is "NO", the following Medicare IM given date fields will be blank) Date Medicare IM given:   Medicare IM given by:   Date Additional Medicare IM given:   Additional Medicare IM given by:    Discharge Disposition:    Per UR Regulation:  Reviewed for med. necessity/level of care/duration of stay  If discussed at [place] of Stay Meetings, dates discussed:    Comments:  Suanne Marker Shelley Woerner,RN,BSN,CCM

## 2013-11-08 LAB — BASIC METABOLIC PANEL
Anion gap: 11 (ref 5–15)
BUN: 25 mg/dL — AB (ref 6–23)
CHLORIDE: 102 meq/L (ref 96–112)
CO2: 26 mEq/L (ref 19–32)
Calcium: 9.2 mg/dL (ref 8.4–10.5)
Creatinine, Ser: 1.05 mg/dL (ref 0.50–1.10)
GFR calc Af Amer: 54 mL/min — ABNORMAL LOW (ref >90)
GFR calc non Af Amer: 47 mL/min — ABNORMAL LOW (ref >90)
Glucose, Bld: 108 mg/dL — ABNORMAL HIGH (ref 70–99)
Potassium: 3.7 mEq/L (ref 3.7–5.3)
SODIUM: 139 meq/L (ref 137–147)

## 2013-11-08 LAB — CBC
HCT: 33.1 % — ABNORMAL LOW (ref 36.0–46.0)
Hemoglobin: 10.7 g/dL — ABNORMAL LOW (ref 12.0–15.0)
MCH: 30.2 pg (ref 26.0–34.0)
MCHC: 32.3 g/dL (ref 30.0–36.0)
MCV: 93.5 fL (ref 78.0–100.0)
Platelets: 120 K/uL — ABNORMAL LOW (ref 150–400)
RBC: 3.54 MIL/uL — ABNORMAL LOW (ref 3.87–5.11)
RDW: 15.6 % — ABNORMAL HIGH (ref 11.5–15.5)
WBC: 6.3 K/uL (ref 4.0–10.5)

## 2013-11-08 LAB — GLUCOSE, CAPILLARY
Glucose-Capillary: 75 mg/dL (ref 70–99)
Glucose-Capillary: 82 mg/dL (ref 70–99)

## 2013-11-08 NOTE — Discharge Instructions (Signed)

## 2013-11-08 NOTE — Progress Notes (Signed)
11/08/13 1315  Reviewed discharge instructions with patient and son. Son verbalized understanding of discharge instructions.  Copy of discharge instructions given to son.

## 2013-11-08 NOTE — Discharge Summary (Signed)
Physician Discharge Summary  [name] GEX:[id number] DOB: March 18, 1924 DOA: 11/06/2013  PCP: Estill Dooms, MD  Admit date: 11/06/2013 Discharge date: 11/08/2013  Recommendations for Outpatient Follow-up:  1. Pt will need to follow up with PCP in 2-3 weeks post discharge 2. Please obtain BMP to evaluate electrolytes and kidney function 3. Please also check CBC to evaluate Hg and Hct levels 4. Pt made aware of the need to follow up with Dr. Christella Noa in 2 weeks and her son was made aware he needs to call office as soon as possible to scheduled an appointment   Discharge Diagnoses: Subdural hematoma after an episode of mechanical fall  Principal Problem:   Subdural hematoma Active Problems:   Type II or unspecified type diabetes mellitus with renal manifestations, uncontrolled(250.42)   HTN (hypertension)   Polymyalgia rheumatica   Personal history of fall   Fall   Discharge Condition: Stable  Diet recommendation: Heart healthy diet discussed in details   Brief narrative:  80 y.o. female with past medical history of diabetes and hypertension, came in to the hospital after she fell and hit her head. Patient denied any dizziness, loss of consciousness or any type of symptoms prior to the fall. Patient has history of frequent falls at home, she fell about 2 weeks ago.   In the ED, CT scan of the head was done and showed a 6 mm left frontal and temporal lobe subdural hematoma, neurosurgery (Dr. Cyndy Freeze) was called and recommended observation and admission to the hospitalist service.   Assessment and Plan:   Principal Problem:  Subdural hematoma  - pt is clinically stable this AM  - neuro exam non focal  - son made aware of need to follow up with Dr. Christella Noa  Active Problems:  Type II or unspecified type diabetes mellitus with renal manifestations, uncontrolled(250.42)  - reasonable inpatient control  HTN (hypertension)  - slightly above target range  Polymyalgia rheumatica  -  continue Prednisone  Personal history of fall  - appreciate Pt assistance  - no recommendations  Moderate PCM  - advanced diet as pt able to tolerate  Acute on chronic renal failure stage II - III - IVF provided and Cr is WNL this AM  Acute on chronic blood loss anemia  - drop in Hg since admission  - no signs of active bleeding so could be dilutional  Cirrhosis with portal HTN  - noted on Ct abd  - no current symptoms  - outpatient follow up  ? Renal lesions  - unclear etiology  - outpatient follow up recommended   Code Status: Full  Family Communication: Pt and son at bedside  Disposition Plan: Home   IV Access:   Peripheral IV Procedures and diagnostic studies:   Ct Head Wo Contrast Ct Cervical Spine Wo Contrast 11/06/2013  1. Acute left frontal and temporal subdural hematoma.  2. Small vessel ischemic disease and brain atrophy.  3. No evidence for cervical spine fracture  4. Postsurgical changes from fusion of C5 through C7.  Ct Abd Wo & W Cm  11/06/2013 ADDENDUM REPORT: 11/06/2013 14:31 ADDENDUM: Not mentioned in the impression section are similar subcentimeter low-density splenic lesions. These are indeterminate but may represent gamna gandy bodies, given cirrhosis and portal venous hypertension.  1. Regression of ablation defect in the right kidney, without evidence of locally recurrent disease.  2. Bilateral small precontrast hyperattenuating renal lesions which are too small to entirely characterize. These could represent hemorrhagic cysts or solid neoplasms.  Likely enlarged since the prior, as detailed above. Recommend attention on follow-up.  3. Cirrhosis with probable portal venous hypertension. No evidence of hepatocellular carcinoma.  4. Cholelithiasis.  Dg Hand Complete Right 11/06/2013 No acute bony pathology. Chronic changes.  Medical Consultants:   Neurosurgery  Other Consultants:   Physical therapy  Anti-Infectives:   None  Discharge Exam: Filed Vitals:    11/08/13 0801  BP: 154/80  Pulse: 70  Temp:   Resp: 16   Filed Vitals:   11/07/13 1731 11/07/13 2144 11/08/13 0533 11/08/13 0801  BP: 141/84 101/68 151/93 154/80  Pulse: 61 65 86 70  Temp: 97.7 F (36.5 C) 98.4 F (36.9 C) 98 F (36.7 C)   TempSrc: Oral Oral Oral   Resp: 16 16 16 16   Height:      Weight:      SpO2: 100% 97% 97% 96%    General: Pt is alert, follows commands appropriately, not in acute distress Cardiovascular: Regular rate and rhythm, no rubs, no gallops Respiratory: Clear to auscultation bilaterally, no wheezing, no crackles, no rhonchi Abdominal: Soft, non tender, non distended, bowel sounds +, no guarding Extremities: no cyanosis, pulses palpable bilaterally DP and PT Neuro: Grossly nonfocal  Discharge Instructions  Discharge Instructions   Diet - low sodium heart healthy    Complete by:  As directed      Increase activity slowly    Complete by:  As directed             Medication List         acyclovir 200 MG capsule  Commonly known as:  ZOVIRAX  Take 200 mg by mouth daily. To prevent relapse of herpes simplex.     amitriptyline 50 MG tablet  Commonly known as:  ELAVIL  Take 1 tablet (50 mg total) by mouth at bedtime.     aspirin EC 325 MG tablet  Take 325 mg by mouth daily.     Cranberry 475 MG Caps  Take 1-2 capsules by mouth 2 (two) times daily. Take 1 capsule every morning and 2 capsules every night.     furosemide 20 MG tablet  Commonly known as:  LASIX  Take 20 mg by mouth daily.     gabapentin 300 MG capsule  Commonly known as:  NEURONTIN  Take 300-600 mg by mouth 2 (two) times daily. Take 300 mg every morning and 600 mg every night at bedtime.     insulin aspart 100 UNIT/ML injection  Commonly known as:  novoLOG  Inject 0-15 Units into the skin as needed. Sugar level less than 150 do not take any Novolog, if sugar level is between 151-200 take 3 units, 201-250 take 5 units, 251-300 take 7 units 301-350 take 9 units, 351-400  take 11 units, sugar level over 400 take 15 units and notify docotor     insulin detemir 100 UNIT/ML injection  Commonly known as:  LEVEMIR  Inject 30 Units into the skin at bedtime.     meloxicam 15 MG tablet  Commonly known as:  MOBIC  One daily to help arthritis     metoprolol tartrate 12.5 mg Tabs tablet  Commonly known as:  LOPRESSOR  Take 12.5 mg by mouth 3 (three) times a week.     multivitamin with minerals Tabs tablet  Take 1 tablet by mouth daily.     oxybutynin 5 MG tablet  Commonly known as:  DITROPAN  Take 5 mg by mouth 3 (three) times daily as needed  for bladder spasms (Up to 3 times daily to control bladder spasms.).     predniSONE 1 MG tablet  Commonly known as:  DELTASONE  Take 3 mg by mouth daily with breakfast.     traMADol 50 MG tablet  Commonly known as:  ULTRAM  Take 50 mg by mouth every 8 (eight) hours as needed (pain.).           Follow-up Information   Follow up with CABBELL,KYLE L, MD In 2 weeks. (will need a repeat ct scan of the head., please call when discharged from hospital to make an appointment.)    Specialty:  Neurosurgery   Contact information:   [address] [place] [address] [phone]       Schedule an appointment as soon as possible for a visit with GREEN, Viviann Spare, MD.   Specialty:  Internal Medicine   Contact information:   [place] [address] [phone]       Follow up with Faye Ramsay, MD. (As needed, call my cell [phone])    Specialty:  Internal Medicine   Contact information:   [address] [place] [address] [phone]        The results of significant diagnostics from this hospitalization (including imaging, microbiology, ancillary and laboratory) are listed below for reference.     Microbiology: Recent Results (from the past 240 hour(s))  MRSA PCR SCREENING     Status: None   Collection Time    11/06/13  9:26 PM      Result  Value Ref Range Status   MRSA by PCR NEGATIVE  NEGATIVE Final   Comment:            The GeneXpert MRSA Assay (FDA     approved for NASAL specimens     only), is one component of a     comprehensive MRSA colonization     surveillance program. It is not     intended to diagnose MRSA     infection nor to guide or     monitor treatment for     MRSA infections.  URINE CULTURE     Status: None   Collection Time    11/07/13 12:24 AM      Result Value Ref Range Status   Specimen Description URINE, RANDOM   Final   Special Requests NONE   Final   Culture  Setup Time     Final   Value: 11/07/2013 04:51     Performed at [facility]     Final   Value: >=100,000 COLONIES/ML     Performed at [facility]   Culture     Final   Value: ESCHERICHIA COLI     Performed at [facility]   Report Status PENDING   Incomplete     Labs: Basic Metabolic Panel:  Recent Labs Lab 11/05/13 1400 11/06/13 1624 11/07/13 0330 11/08/13 0015  NA  --  142 144 139  K  --  4.8 4.0 3.7  CL  --  101 104 102  CO2  --  28 29 26   GLUCOSE  --  232* 197* 108*  BUN 26* 25* 24* 25*  CREATININE 1.24* 1.12* 1.25* 1.05  CALCIUM  --  9.4 9.0 9.2   Liver Function Tests:  Recent Labs Lab 11/06/13 1624  AST 19  ALT 12  ALKPHOS 73  BILITOT 0.4  PROT 6.6  ALBUMIN 3.4*   No results found for this basename: LIPASE, AMYLASE,  in the last 168 hours No results found for this basename: AMMONIA,  in the last 168 hours CBC:  Recent Labs Lab 11/06/13 1624 11/07/13 0330 11/08/13 0015  WBC 7.2 6.6 6.3  NEUTROABS 5.7  --   --   HGB 11.6* 10.6* 10.7*  HCT 35.5* 32.1* 33.1*  MCV 93.2 92.8 93.5  PLT 156 152 120*   Cardiac Enzymes: No results found for this basename: CKTOTAL, CKMB, CKMBINDEX, TROPONINI,  in the last 168 hours BNP: BNP (last 3 results) No results found for this basename: PROBNP,  in the last 8760 hours CBG:  Recent Labs Lab 11/07/13 1141  11/07/13 1653 11/07/13 1809 11/07/13 2211 11/08/13 0752  GLUCAP 62* 98 108* 113* 75     SIGNED: Time coordinating discharge: Over 30 minutes  Faye Ramsay, MD  [facility] 11/08/2013, 11:45 AM Pager [phone]  If 7PM-7AM, please contact night-coverage [email or web address] Password [email or web address]

## 2013-11-09 LAB — URINE CULTURE: Colony Count: >=100000

## 2013-11-11 NOTE — Progress Notes (Signed)
Discharge summary sent to payer through [facility]  

## 2013-11-12 ENCOUNTER — Emergency Department (HOSPITAL_COMMUNITY): Payer: Medicare HMO

## 2013-11-12 ENCOUNTER — Emergency Department (HOSPITAL_COMMUNITY)
Admission: EM | Admit: 2013-11-12 | Discharge: 2013-11-13 | Disposition: A | Discharge status: Home / Self Care | Payer: Medicare HMO | Attending: Emergency Medicine | Admitting: Emergency Medicine

## 2013-11-12 ENCOUNTER — Encounter (HOSPITAL_COMMUNITY): Payer: Self-pay | Admitting: Emergency Medicine

## 2013-11-12 DIAGNOSIS — G909 Disorder of the autonomic nervous system, unspecified: Secondary | ICD-10-CM | POA: Diagnosis not present

## 2013-11-12 DIAGNOSIS — M129 Arthropathy, unspecified: Secondary | ICD-10-CM | POA: Diagnosis not present

## 2013-11-12 DIAGNOSIS — S0990XA Unspecified injury of head, initial encounter: Secondary | ICD-10-CM | POA: Insufficient documentation

## 2013-11-12 DIAGNOSIS — Z87891 Personal history of nicotine dependence: Secondary | ICD-10-CM | POA: Diagnosis not present

## 2013-11-12 DIAGNOSIS — Y9289 Other specified places as the place of occurrence of the external cause: Secondary | ICD-10-CM | POA: Diagnosis not present

## 2013-11-12 DIAGNOSIS — S065XAA Traumatic subdural hemorrhage with loss of consciousness status unknown, initial encounter: Secondary | ICD-10-CM

## 2013-11-12 DIAGNOSIS — Y9389 Activity, other specified: Secondary | ICD-10-CM | POA: Diagnosis not present

## 2013-11-12 DIAGNOSIS — E1049 Type 1 diabetes mellitus with other diabetic neurological complication: Secondary | ICD-10-CM | POA: Diagnosis not present

## 2013-11-12 DIAGNOSIS — Z862 Personal history of diseases of the blood and blood-forming organs and certain disorders involving the immune mechanism: Secondary | ICD-10-CM | POA: Insufficient documentation

## 2013-11-12 DIAGNOSIS — Z7982 Long term (current) use of aspirin: Secondary | ICD-10-CM | POA: Insufficient documentation

## 2013-11-12 DIAGNOSIS — J449 Chronic obstructive pulmonary disease, unspecified: Secondary | ICD-10-CM | POA: Diagnosis not present

## 2013-11-12 DIAGNOSIS — S065X9A Traumatic subdural hemorrhage with loss of consciousness of unspecified duration, initial encounter: Secondary | ICD-10-CM | POA: Insufficient documentation

## 2013-11-12 DIAGNOSIS — F329 Major depressive disorder, single episode, unspecified: Secondary | ICD-10-CM | POA: Insufficient documentation

## 2013-11-12 DIAGNOSIS — R739 Hyperglycemia, unspecified: Secondary | ICD-10-CM

## 2013-11-12 DIAGNOSIS — F3289 Other specified depressive episodes: Secondary | ICD-10-CM | POA: Diagnosis not present

## 2013-11-12 DIAGNOSIS — Z87448 Personal history of other diseases of urinary system: Secondary | ICD-10-CM | POA: Diagnosis not present

## 2013-11-12 DIAGNOSIS — W1809XA Striking against other object with subsequent fall, initial encounter: Secondary | ICD-10-CM | POA: Diagnosis not present

## 2013-11-12 DIAGNOSIS — Z791 Long term (current) use of non-steroidal anti-inflammatories (NSAID): Secondary | ICD-10-CM | POA: Diagnosis not present

## 2013-11-12 DIAGNOSIS — R42 Dizziness and giddiness: Secondary | ICD-10-CM | POA: Diagnosis not present

## 2013-11-12 DIAGNOSIS — F411 Generalized anxiety disorder: Secondary | ICD-10-CM | POA: Diagnosis not present

## 2013-11-12 DIAGNOSIS — Z8673 Personal history of transient ischemic attack (TIA), and cerebral infarction without residual deficits: Secondary | ICD-10-CM | POA: Insufficient documentation

## 2013-11-12 DIAGNOSIS — Z794 Long term (current) use of insulin: Secondary | ICD-10-CM | POA: Diagnosis not present

## 2013-11-12 DIAGNOSIS — Z8553 Personal history of malignant neoplasm of renal pelvis: Secondary | ICD-10-CM | POA: Insufficient documentation

## 2013-11-12 DIAGNOSIS — I10 Essential (primary) hypertension: Secondary | ICD-10-CM | POA: Insufficient documentation

## 2013-11-12 DIAGNOSIS — J4489 Other specified chronic obstructive pulmonary disease: Secondary | ICD-10-CM | POA: Insufficient documentation

## 2013-11-12 DIAGNOSIS — Z8619 Personal history of other infectious and parasitic diseases: Secondary | ICD-10-CM | POA: Diagnosis not present

## 2013-11-12 DIAGNOSIS — IMO0002 Reserved for concepts with insufficient information to code with codable children: Secondary | ICD-10-CM | POA: Insufficient documentation

## 2013-11-12 DIAGNOSIS — Z8719 Personal history of other diseases of the digestive system: Secondary | ICD-10-CM | POA: Diagnosis not present

## 2013-11-12 DIAGNOSIS — I509 Heart failure, unspecified: Secondary | ICD-10-CM | POA: Diagnosis not present

## 2013-11-12 DIAGNOSIS — E1065 Type 1 diabetes mellitus with hyperglycemia: Secondary | ICD-10-CM

## 2013-11-12 LAB — PROTIME-INR
INR: 1.12 (ref 0.00–1.49)
Prothrombin Time: 14.4 seconds (ref 11.6–15.2)

## 2013-11-12 LAB — CBC WITH DIFFERENTIAL/PLATELET
BASOS PCT: 1 % (ref 0–1)
Basophils Absolute: 0 10*3/uL (ref 0.0–0.1)
EOS PCT: 4 % (ref 0–5)
Eosinophils Absolute: 0.3 10*3/uL (ref 0.0–0.7)
HEMATOCRIT: 33.2 % — AB (ref 36.0–46.0)
Hemoglobin: 11 g/dL — ABNORMAL LOW (ref 12.0–15.0)
LYMPHS ABS: 1.5 10*3/uL (ref 0.7–4.0)
Lymphocytes Relative: 23 % (ref 12–46)
MCH: 31.1 pg (ref 26.0–34.0)
MCHC: 33.1 g/dL (ref 30.0–36.0)
MCV: 93.8 fL (ref 78.0–100.0)
MONOS PCT: 11 % (ref 3–12)
Monocytes Absolute: 0.7 10*3/uL (ref 0.1–1.0)
Neutro Abs: 4 10*3/uL (ref 1.7–7.7)
Neutrophils Relative %: 61 % (ref 43–77)
Platelets: 169 10*3/uL (ref 150–400)
RBC: 3.54 MIL/uL — AB (ref 3.87–5.11)
RDW: 16.2 % — ABNORMAL HIGH (ref 11.5–15.5)
WBC: 6.6 10*3/uL (ref 4.0–10.5)

## 2013-11-12 LAB — BASIC METABOLIC PANEL
Anion gap: 13 (ref 5–15)
BUN: 43 mg/dL — AB (ref 6–23)
CO2: 25 mEq/L (ref 19–32)
Calcium: 9.7 mg/dL (ref 8.4–10.5)
Chloride: 102 mEq/L (ref 96–112)
Creatinine, Ser: 1.28 mg/dL — ABNORMAL HIGH (ref 0.50–1.10)
GFR calc Af Amer: 43 mL/min — ABNORMAL LOW (ref >90)
GFR calc non Af Amer: 37 mL/min — ABNORMAL LOW (ref >90)
GLUCOSE: 190 mg/dL — AB (ref 70–99)
POTASSIUM: 4.7 meq/L (ref 3.7–5.3)
Sodium: 140 mEq/L (ref 137–147)

## 2013-11-12 NOTE — ED Provider Notes (Signed)
CSN: [id number]     Arrival date & time 11/12/13  1822 History   First MD Initiated Contact with Patient 11/12/13 2033     Chief Complaint  Patient presents with  . Dizziness  . post fall head injury   Patient is a 80 y.o. female presenting with dizziness.  Dizziness   Patient is an 80 y.o. Female with recent subdural hematomas on 11/06/13 who presents to the ED with her son with dizziness.  Patient states that ever since being sent home she has had worsening dizziness and complaints of loss of balance.  Per her son the patient has had difficulty with occasional slurred speech and difficulty picking her words.  Patient also states that she has this feeling of sloshing in the front of her skull.  Patient states that she has not had any changes in her vision.  Patient does admit to some hearing loss, blurred vision which has not changed from baseline, weakness, fatigue, and loss of balance.  Patient denies HA, nausea, vomiting, shortness of breath, chest pain, abdominal pain, syncope, tremors, tingling, numbness, diarrhea, melena, hematochezia. All other ROS are negative.  Patient has dizziness mostly with standing.   Past Medical History  Diagnosis Date  . HTN (hypertension)   . Osteoporosis   . 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 COPD (chronic obstructive pulmonary disease)     no inhalers used   . PONV (postoperative nausea and vomiting)     in past, none recently  . History of TIA (transient ischemic attack) 2012--  NO RESIDUAL  . CHF (congestive heart failure)   . Arthritis     BACK , NECK, AND JOINTS  . Temporal arteritis   . Chronic steroid use   . 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 Macular degeneration of both eyes   . Diabetic peripheral neuropathy   . Insulin dependent diabetes mellitus   . Chronic cystitis   . Hemoperitoneum (nontraumatic)   . Urinary frequency   . Unspecified late effects of  cerebrovascular disease   . Abnormality of gait   . Osteoporosis, unspecified   . Anxiety state, unspecified   . Herpes zoster with other nervous system complications(053.19)   . Anemia, unspecified   . Type I (juvenile type) diabetes mellitus without mention of complication, uncontrolled   . Other and unspecified hyperlipidemia   . Personality change due to conditions classified elsewhere   . Unspecified essential hypertension   . Dermatophytosis of nail   . Depressive disorder, not elsewhere classified   . Diaphragmatic hernia without mention of obstruction or gangrene   . Malignant neoplasm of renal pelvis    Past Surgical History  Procedure Laterality Date  . Lumbar laminectomy  11-19-2003    L3 -- L4  . Total hip arthroplasty Left 01-22-2005  . Total knee arthroplasty Bilateral LEFT 08-08-2007;  RIGHT 05-08-2009  . Anterior cervical decomp/discectomy fusion  10-12-2005    C5 -- C7  . Posterior fusion cervical spine  02-10-2006    C5 -- C7  . Orif w/ plate right distal radius fx  06-18-2003  . Cataract extraction w/ intraocular lens  implant, bilateral Right Oct 1992    Epes  . Cryoablation right renal mass  03-16-2012    ONCOCYTOMA (POST OP 06-09-2012 PERCUTANEOUS DRAIN PLACEMENT FOR PERINEPHRIC ABSCESS)  . Temporal artery biopsy / ligation    . Elbow surgery Bilateral 2003  (APPROX)  .  Carpal tunnel release Right 2009  (APPROX)  . Cystoscopy w/ ureteral stent placement Right 07/10/2012    Procedure: CYSTOSCOPY WITH RIGHT RETROGRADE PYELOGRAM/URETERAL STENT PLACEMENT;  Surgeon: Malka So, MD;  Location: [facility];  Service: Urology;  Laterality: Right;  . Hip arthroplasty Right 08/03/2012    Procedure: CEMENTED MONOPOLAR HIP;  Surgeon: Marybelle Killings, MD;  Location: [facility];  Service: Orthopedics;  Laterality: Right;  . Cataract extraction w/ intraocular lens implant Left 1993    Epes   Family History  Problem Relation Age of Onset  . Leukemia    . Cancer  Mother   . Cancer Father   . Cancer Brother   . Cancer Daughter    History  Substance Use Topics  . Smoking status: Former Smoker -- 2.00 packs/day for 30 years    Types: Cigarettes    Quit date: 01/11/1983  . Smokeless tobacco: Never Used  . Alcohol Use: No   OB History   Grav Para Term Preterm Abortions TAB SAB Ect Mult Living                 Review of Systems  Neurological: Positive for dizziness.    See HPI  Allergies  Allegra; Levaquin; Tussionex pennkinetic er; Codeine; and Morphine and related  Home Medications   Prior to Admission medications   Medication Sig Start Date End Date Taking? Authorizing Provider  acyclovir (ZOVIRAX) 200 MG capsule Take 200 mg by mouth daily. To prevent relapse of herpes simplex.   Yes Historical Provider, MD  amitriptyline (ELAVIL) 50 MG tablet Take 1 tablet (50 mg total) by mouth at bedtime. 09/04/13  Yes Estill Dooms, MD  aspirin EC 325 MG tablet Take 325 mg by mouth daily.    Yes Historical Provider, MD  Cranberry 475 MG CAPS Take 1-2 capsules by mouth 2 (two) times daily. Take 1 capsule every morning and 2 capsules every night.   Yes Historical Provider, MD  furosemide (LASIX) 20 MG tablet Take 20 mg by mouth daily.   Yes Historical Provider, MD  gabapentin (NEURONTIN) 300 MG capsule Take 300-600 mg by mouth 2 (two) times daily. Take 300 mg every morning and 600 mg every night at bedtime.   Yes Historical Provider, MD  insulin aspart (NOVOLOG) 100 UNIT/ML injection Inject 0-15 Units into the skin as needed. Sugar level less than 150 do not take any Novolog, if sugar level is between 151-200 take 3 units, 201-250 take 5 units, 251-300 take 7 units 301-350 take 9 units, 351-400 take 11 units, sugar level over 400 take 15 units and notify docotor 08/27/13  Yes Mahima Pandey, MD  insulin detemir (LEVEMIR) 100 UNIT/ML injection Inject 30 Units into the skin at bedtime.   Yes Historical Provider, MD  meloxicam (MOBIC) 15 MG tablet Take 15 mg by  mouth daily.  09/04/13  Yes Estill Dooms, MD  metoprolol tartrate (LOPRESSOR) 12.5 mg TABS tablet Take 12.5 mg by mouth 3 (three) times a week.   Yes Historical Provider, MD  Multiple Vitamin (MULTIVITAMIN WITH MINERALS) TABS Take 1 tablet by mouth daily.   Yes Historical Provider, MD  oxybutynin (DITROPAN) 5 MG tablet Take 5 mg by mouth 3 (three) times daily as needed for bladder spasms (Up to 3 times daily to control bladder spasms.).   Yes Historical Provider, MD  predniSONE (DELTASONE) 1 MG tablet Take 3 mg by mouth daily with breakfast.   Yes Historical Provider, MD  traMADol (ULTRAM) 50  MG tablet Take 50 mg by mouth every 8 (eight) hours as needed (pain.).   Yes Historical Provider, MD   BP 136/63  Pulse 69  Resp 16  SpO2 93% Physical Exam  Nursing note and vitals reviewed. Constitutional: She is oriented to person, place, and time. She appears well-developed and well-nourished. No distress.  HENT:  Head: Normocephalic.  Mouth/Throat: Oropharynx is clear and moist. No oropharyngeal exudate.  Staples located in the occiput which appear to be well placed.  There is some dried crusted blood in the surrouding hair.  No erythema, swelling, or discharge.  Eyes: Conjunctivae and EOM are normal. Pupils are equal, round, and reactive to light. No scleral icterus.  Neck: Normal range of motion. Neck supple. No JVD present. No thyromegaly present.  Cardiovascular: Normal rate, regular rhythm, normal heart sounds and intact distal pulses.  Exam reveals no gallop and no friction rub.   No murmur heard. Pulmonary/Chest: Effort normal and breath sounds normal. No respiratory distress. She has no wheezes. She has no rales. She exhibits no tenderness.  Abdominal: Soft. Bowel sounds are normal. She exhibits no distension and no mass. There is no tenderness. There is no rebound and no guarding.  Lymphadenopathy:    She has no cervical adenopathy.  Neurological: She is alert and oriented to person,  place, and time. She has normal strength. No cranial nerve deficit or sensory deficit. Coordination normal. GCS eye subscore is 4. GCS verbal subscore is 5. GCS motor subscore is 6.  Finger to nose difficult to perform given macular degeneration  Skin: Skin is warm and dry. She is not diaphoretic.  Psychiatric: She has a normal mood and affect. Her behavior is normal. Judgment and thought content normal.    ED Course  Procedures (including critical care time) Labs Review Labs Reviewed  CBC WITH DIFFERENTIAL - Abnormal; Notable for the following:    RBC 3.54 (*)    Hemoglobin 11.0 (*)    HCT 33.2 (*)    RDW 16.2 (*)    All other components within normal limits  BASIC METABOLIC PANEL - Abnormal; Notable for the following:    Glucose, Bld 190 (*)    BUN 43 (*)    Creatinine, Ser 1.28 (*)    GFR calc non Af Amer 37 (*)    GFR calc Af Amer 43 (*)    All other components within normal limits  PROTIME-INR    Imaging Review Ct Head Wo Contrast  11/12/2013   CLINICAL DATA:  Dizziness.  EXAM: CT HEAD WITHOUT CONTRAST  TECHNIQUE: Contiguous axial images were obtained from the base of the skull through the vertex without intravenous contrast.  COMPARISON:  11/06/2013.  FINDINGS: Left frontal and left temporal subdural hematomas again noted. No interim change. No mass. No hydrocephalus. No hemorrhage. White matter changes noted consistent chronic ischemia. Orbits are unremarkable. No acute bony abnormality. Opacification of left mastoid sinus. Fracture of the left inferior orbital rim/ anterior left maxillary sinus and lateral wall of the left maxillary sinus noted.  IMPRESSION: 1. Stable left frontal and left temporal subdural hematomas again noted. No interim intracranial change. No acute abnormality new abnormality. 2. Opacification left maxillary sinus with left anterior orbital rim /anterior maxillary sinus and lateral wall left maxillary sinus fractures.   Electronically Signed   By: Marcello Moores   Register   On: 11/12/2013 21:47     EKG Interpretation None      MDM   Final diagnoses:  Subdural hematoma  Dizziness  Hyperglycemia   Patient is an 80 y.o. Female who presents to the ED with dizziness after subdural hematomas.  Physical examination is without neurological deficits at this time.  Patient had repeat head CT given worsening symptoms to evalauate hematomas which appear to be stable from prior CT scan.  Patient does appear to have maxillary sinus fractures from previous fall which were not seen on prior CT scans.  I spoke with Dr. Nicole Kindred from neurology who did not feel that the patient needed any further imaging including MRI or CTA to check on the posterior circulation at this time. Patient was able to ambulate here with no difficulty.  Patient had CBC, BMP, and PT/INR.  CBC is at baseline from discharge.  BMP reveals mild hyperglycemia.  PT/INR is normal.  Given appropriate ambulation at this time the patient is stable for discharge home at this time.  Will have the patient follow-up with her PCP for staple removal and to discuss the need for PT/OT given recent fall.  Patient was also seen by Dr. Alvino Chapel who agrees with the above plan and workup.  Patient was told to return for CVA symptoms and changes from normal behavior.  Patient states understanding and agreement.      Cherylann Parr, PA-C 11/13/13 0007

## 2013-11-12 NOTE — ED Notes (Signed)
Pt fell and hit her head last week. Pt has been dizzy and feel like her head is slushing around and when she tries to stand up her legs buckle up under her.  Pt's family member called [facility] and was told to bring pt into ED and notify her when they are here.   Pt has staples in her head from fall last week.  Pt sis have some bleeding in the brain due to the fall.

## 2013-11-12 NOTE — ED Notes (Signed)
Called, got no answer so left voicemail for Faye Ramsay MD at [phone], informing her pt in [facility] ED triage area.

## 2013-11-12 NOTE — ED Notes (Signed)
Dr Doyle Askew called and spoke with this writer,  She strongly urged a 2nd CT scan of head,  Due to recent hospitalization of sub dural hematoma.  Pt  Was referred to Dr Cyndy Freeze  Neurologist for follow up

## 2013-11-12 NOTE — ED Notes (Signed)
Pt states she had a brain bleed and swelling last week,  She states she is not feeling herself since being discharged

## 2013-11-12 NOTE — ED Notes (Signed)
Pt was ambulated to the restroom with stand by assist.

## 2013-11-13 NOTE — ED Notes (Signed)
Son [phone] (cell)

## 2013-11-13 NOTE — Discharge Instructions (Signed)
Dizziness °Dizziness is a common problem. It is a feeling of unsteadiness or light-headedness. You may feel like you are about to faint. Dizziness can lead to injury if you stumble or fall. A person of any age group can suffer from dizziness, but dizziness is more common in older adults. °CAUSES  °Dizziness can be caused by many different things, including: °· Middle ear problems. °· Standing for too long. °· Infections. °· An allergic reaction. °· Aging. °· An emotional response to something, such as the sight of blood. °· Side effects of medicines. °· Tiredness. °· Problems with circulation or blood pressure. °· Excessive use of alcohol or medicines, or illegal drug use. °· Breathing too fast (hyperventilation). °· An irregular heart rhythm (arrhythmia). °· A low red blood cell count (anemia). °· Pregnancy. °· Vomiting, diarrhea, fever, or other illnesses that cause body fluid loss (dehydration). °· Diseases or conditions such as Parkinson's disease, high blood pressure (hypertension), diabetes, and thyroid problems. °· Exposure to extreme heat. °DIAGNOSIS  °Your health care provider will ask about your symptoms, perform a physical exam, and perform an electrocardiogram (ECG) to record the electrical activity of your heart. Your health care provider may also perform other heart or blood tests to determine the cause of your dizziness. These may include: °· Transthoracic echocardiogram (TTE). During echocardiography, sound waves are used to evaluate how blood flows through your heart. °· Transesophageal echocardiogram (TEE). °· Cardiac monitoring. This allows your health care provider to monitor your heart rate and rhythm in real time. °· Holter monitor. This is a portable device that records your heartbeat and can help diagnose heart arrhythmias. It allows your health care provider to track your heart activity for several days if needed. °· Stress tests by exercise or by giving medicine that makes the heart beat  faster. °TREATMENT  °Treatment of dizziness depends on the cause of your symptoms and can vary greatly. °HOME CARE INSTRUCTIONS  °· Drink enough fluids to keep your urine clear or pale yellow. This is especially important in very hot weather. In older adults, it is also important in cold weather. °· Take your medicine exactly as directed if your dizziness is caused by medicines. When taking blood pressure medicines, it is especially important to get up slowly. °¨ Rise slowly from chairs and steady yourself until you feel okay. °¨ In the morning, first sit up on the side of the bed. When you feel okay, stand slowly while holding onto something until you know your balance is fine. °· Move your legs often if you need to stand in one place for a long time. Tighten and relax your muscles in your legs while standing. °· Have someone stay with you for 1-2 days if dizziness continues to be a problem. Do this until you feel you are well enough to stay alone. Have the person call your health care provider if he or she notices changes in you that are concerning. °· Do not drive or use heavy machinery if you feel dizzy. °· Do not drink alcohol. °SEEK IMMEDIATE MEDICAL CARE IF:  °· Your dizziness or light-headedness gets worse. °· You feel nauseous or vomit. °· You have problems talking, walking, or using your arms, hands, or legs. °· You feel weak. °· You are not thinking clearly or you have trouble forming sentences. It may take a friend or family member to notice this. °· You have chest pain, abdominal pain, shortness of breath, or sweating. °· Your vision changes. °· You notice   any bleeding.  You have side effects from medicine that seems to be getting worse rather than better. MAKE SURE YOU:   Understand these instructions.  Will watch your condition.  Will get help right away if you are not doing well or get worse. Document Released: 08/17/2000 Document Revised: 02/26/2013 Document Reviewed: 09/10/2010 [facility]  Patient Information 2015 [place], [place]. This information is not intended to replace advice given to you by your health care provider. Make sure you discuss any questions you have with your health care provider.  Subdural Hematoma A subdural hematoma is a collection of blood between the brain and its tough outermost membrane covering (the dura). Blood clots that form in this area push down on the brain and cause irritation. A subdural hematoma may cause parts of the brain to stop working and eventually cause death.  CAUSES A subdural hematoma is caused by bleeding from a ruptured blood vessel (hemorrhage). The bleeding results from trauma to the head, such as from a fall or motor vehicle accident. There are two types of subdural hemorrhages:  Acute. This type develops shortly after a serious blow to the head and causes blood to collect very quickly. If not diagnosed and treated promptly, severe brain injury or death can occur.  Chronic. This is when bleeding develops more slowly, over weeks or months. RISK FACTORS People at risk for subdural hematoma include older persons, infants, and alcoholics. SYMPTOMS An acute subdural hemorrhage develops over minutes to hours. Symptoms can include:  Temporary loss of consciousness.  Weakness of arms or legs on one side of the body.  Changes in vision or speech.  A severe headache.  Seizures.  Nausea and vomiting.  Increased sleepiness. A chronic subdural hemorrhage develops over weeks to months. Symptoms may develop slowly and produce less noticeable problems or changes. Symptoms include:  A mild headache.  A change in personality.  Loss of balance or difficulty walking.  Weakness, numbness, or tingling in the arms or legs.  Nausea or vomiting.  Memory loss.  Double vision.  Increased sleepiness. DIAGNOSIS Your health care provider will perform a thorough physical and neurological exam. A CT scan or MRI may also be done. If there  is blood on the scan, its color will help your health care provider determine how long the hemorrhage has been there. TREATMENT If the cause is an acute subdural hemorrhage, immediate treatment is needed. In many cases an emergency surgery is performed to drain accumulated blood or to remove the blood clot. Sometimes steroid or diuretic medicines or controlled breathing through a ventilator is needed to decrease pressure in the brain. This is especially true if there is any swelling of the brain. If the cause is a chronic subdural hemorrhage, treatment depends on a variety of factors. Sometimes no treatment is needed. If the subdural hematoma is small and causes minimal or no symptoms, you may be treated with bed rest, medicines, and observation. If the hemorrhage is large or if you have neurological symptoms, an emergency surgery is usually needed to remove the blood clot. People who develop a subdural hemorrhage are at risk of developing seizures, even after the subdural hematoma has been treated. You may be prescribed an anti-seizure (anticonvulsant) medicine for a year or longer. HOME CARE INSTRUCTIONS  Only take medicines as directed by your health care provider.  Rest if directed by your health care provider.  Keep all follow-up appointments with your health care provider.  If you play a contact sport such as  football, hockey or soccer and you experienced a significant head injury, allow enough time for healing (up to 15 days) before you start playing again. A repeated injury that occurs during this fragile repair period is likely to result in hemorrhage. This is called the second impact syndrome. SEEK IMMEDIATE MEDICAL CARE IF:  You fall or experience minor trauma to your head and you are taking blood thinners. If you are on any blood thinners even a very small injury can cause a subdural hematoma. You should not hesitate to seek medical attention regardless of how minor you think your  symptoms are.  You experience a head injury and have:  Drowsiness or a decrease in alertness.  Confusion or forgetfulness.  Slurred speech.  Irrational or aggressive behavior.  Numbness or paralysis in any part of the body.  A feeling of being sick to your stomach (nauseous) or you throw up (vomit).  Difficulty walking or poor coordination.  Double vision.  Seizures.  A bleeding disorder.  A history of heavy alcohol use.  Clear fluid draining from your nose or ears.  Personality changes.  Difficulty thinking.  Worsening symptoms. MAKE SURE YOU:  Understand these instructions.  Will watch your condition.  Will get help right away if you are not doing well or get worse. [place] of Neurological Disorders and Stroke: MasterBoxes.it American Association of Neurological Surgeons: www.neurosurgerytoday.org American Academy of Neurology (AAN): http://keith.biz/ Brain Injury Association of America: www.biausa.org Document Released: 01/09/2004 Document Revised: 12/12/2012 Document Reviewed: 08/24/2012 [facility] Patient Information 2015 [place], [place]. This information is not intended to replace advice given to you by your health care provider. Make sure you discuss any questions you have with your health care provider.

## 2013-11-15 NOTE — ED Provider Notes (Signed)
Medical screening examination/treatment/procedure(s) were conducted as a shared visit with non-physician practitioner(s) and myself.  I personally evaluated the patient during the encounter.   EKG Interpretation   Date/Time:  Tuesday November 12 2013 21:55:53 EDT Ventricular Rate:  86 PR Interval:  162 QRS Duration: 115 QT Interval:  404 QTC Calculation: 483 R Axis:   -18 Text Interpretation:  Sinus rhythm Multiple premature complexes, vent   Nonspecific intraventricular conduction delay ED PHYSICIAN INTERPRETATION  AVAILABLE IN [facility] Confirmed by TEST, Record ([id number]) on  11/14/2013 7:17:19 AM     Patient with fall. Has had dizziness since. Likely concussive. Has had previous subdurals which were stable on CT.  Jasper Riling. Alvino Chapel, MD 11/15/13 2315

## 2013-11-16 ENCOUNTER — Inpatient Hospital Stay (HOSPITAL_COMMUNITY)
Admission: EM | Admit: 2013-11-16 | Discharge: 2013-11-19 | DRG: 689 | Disposition: A | Discharge status: Home / Self Care | Payer: Medicare HMO | Attending: Internal Medicine | Admitting: Internal Medicine

## 2013-11-16 ENCOUNTER — Encounter (HOSPITAL_COMMUNITY): Payer: Self-pay | Admitting: Emergency Medicine

## 2013-11-16 ENCOUNTER — Emergency Department (HOSPITAL_COMMUNITY): Payer: Medicare HMO

## 2013-11-16 DIAGNOSIS — R0902 Hypoxemia: Secondary | ICD-10-CM

## 2013-11-16 DIAGNOSIS — M47812 Spondylosis without myelopathy or radiculopathy, cervical region: Secondary | ICD-10-CM

## 2013-11-16 DIAGNOSIS — M353 Polymyalgia rheumatica: Secondary | ICD-10-CM

## 2013-11-16 DIAGNOSIS — F32A Depression, unspecified: Secondary | ICD-10-CM

## 2013-11-16 DIAGNOSIS — Z79899 Other long term (current) drug therapy: Secondary | ICD-10-CM | POA: Diagnosis not present

## 2013-11-16 DIAGNOSIS — G609 Hereditary and idiopathic neuropathy, unspecified: Secondary | ICD-10-CM

## 2013-11-16 DIAGNOSIS — F329 Major depressive disorder, single episode, unspecified: Secondary | ICD-10-CM | POA: Diagnosis present

## 2013-11-16 DIAGNOSIS — M159 Polyosteoarthritis, unspecified: Secondary | ICD-10-CM

## 2013-11-16 DIAGNOSIS — Z96649 Presence of unspecified artificial hip joint: Secondary | ICD-10-CM

## 2013-11-16 DIAGNOSIS — R627 Adult failure to thrive: Secondary | ICD-10-CM | POA: Diagnosis present

## 2013-11-16 DIAGNOSIS — F0390 Unspecified dementia without behavioral disturbance: Secondary | ICD-10-CM | POA: Diagnosis present

## 2013-11-16 DIAGNOSIS — I10 Essential (primary) hypertension: Secondary | ICD-10-CM

## 2013-11-16 DIAGNOSIS — M542 Cervicalgia: Secondary | ICD-10-CM

## 2013-11-16 DIAGNOSIS — Z794 Long term (current) use of insulin: Secondary | ICD-10-CM

## 2013-11-16 DIAGNOSIS — I129 Hypertensive chronic kidney disease with stage 1 through stage 4 chronic kidney disease, or unspecified chronic kidney disease: Secondary | ICD-10-CM | POA: Diagnosis present

## 2013-11-16 DIAGNOSIS — Z87891 Personal history of nicotine dependence: Secondary | ICD-10-CM | POA: Diagnosis not present

## 2013-11-16 DIAGNOSIS — R5381 Other malaise: Secondary | ICD-10-CM | POA: Diagnosis not present

## 2013-11-16 DIAGNOSIS — Z8553 Personal history of malignant neoplasm of renal pelvis: Secondary | ICD-10-CM | POA: Diagnosis not present

## 2013-11-16 DIAGNOSIS — E1149 Type 2 diabetes mellitus with other diabetic neurological complication: Secondary | ICD-10-CM | POA: Diagnosis present

## 2013-11-16 DIAGNOSIS — G9340 Encephalopathy, unspecified: Secondary | ICD-10-CM | POA: Diagnosis present

## 2013-11-16 DIAGNOSIS — Z7982 Long term (current) use of aspirin: Secondary | ICD-10-CM

## 2013-11-16 DIAGNOSIS — E1165 Type 2 diabetes mellitus with hyperglycemia: Secondary | ICD-10-CM | POA: Diagnosis present

## 2013-11-16 DIAGNOSIS — F3289 Other specified depressive episodes: Secondary | ICD-10-CM | POA: Diagnosis present

## 2013-11-16 DIAGNOSIS — Z9181 History of falling: Secondary | ICD-10-CM | POA: Diagnosis not present

## 2013-11-16 DIAGNOSIS — D693 Immune thrombocytopenic purpura: Secondary | ICD-10-CM

## 2013-11-16 DIAGNOSIS — H3530 Unspecified macular degeneration: Secondary | ICD-10-CM | POA: Diagnosis present

## 2013-11-16 DIAGNOSIS — I5032 Chronic diastolic (congestive) heart failure: Secondary | ICD-10-CM

## 2013-11-16 DIAGNOSIS — R32 Unspecified urinary incontinence: Secondary | ICD-10-CM

## 2013-11-16 DIAGNOSIS — I509 Heart failure, unspecified: Secondary | ICD-10-CM | POA: Diagnosis present

## 2013-11-16 DIAGNOSIS — E785 Hyperlipidemia, unspecified: Secondary | ICD-10-CM | POA: Diagnosis present

## 2013-11-16 DIAGNOSIS — N058 Unspecified nephritic syndrome with other morphologic changes: Secondary | ICD-10-CM | POA: Diagnosis present

## 2013-11-16 DIAGNOSIS — M6281 Muscle weakness (generalized): Secondary | ICD-10-CM

## 2013-11-16 DIAGNOSIS — G5601 Carpal tunnel syndrome, right upper limb: Secondary | ICD-10-CM

## 2013-11-16 DIAGNOSIS — Z96659 Presence of unspecified artificial knee joint: Secondary | ICD-10-CM | POA: Diagnosis not present

## 2013-11-16 DIAGNOSIS — Z23 Encounter for immunization: Secondary | ICD-10-CM | POA: Diagnosis not present

## 2013-11-16 DIAGNOSIS — Z961 Presence of intraocular lens: Secondary | ICD-10-CM

## 2013-11-16 DIAGNOSIS — R0981 Nasal congestion: Secondary | ICD-10-CM

## 2013-11-16 DIAGNOSIS — R58 Hemorrhage, not elsewhere classified: Secondary | ICD-10-CM

## 2013-11-16 DIAGNOSIS — N183 Chronic kidney disease, stage 3 unspecified: Secondary | ICD-10-CM | POA: Diagnosis present

## 2013-11-16 DIAGNOSIS — R1310 Dysphagia, unspecified: Secondary | ICD-10-CM

## 2013-11-16 DIAGNOSIS — S065XAA Traumatic subdural hemorrhage with loss of consciousness status unknown, initial encounter: Secondary | ICD-10-CM

## 2013-11-16 DIAGNOSIS — S065X9A Traumatic subdural hemorrhage with loss of consciousness of unspecified duration, initial encounter: Secondary | ICD-10-CM

## 2013-11-16 DIAGNOSIS — J449 Chronic obstructive pulmonary disease, unspecified: Secondary | ICD-10-CM | POA: Diagnosis present

## 2013-11-16 DIAGNOSIS — M810 Age-related osteoporosis without current pathological fracture: Secondary | ICD-10-CM

## 2013-11-16 DIAGNOSIS — K683 Retroperitoneal hematoma: Secondary | ICD-10-CM

## 2013-11-16 DIAGNOSIS — Z981 Arthrodesis status: Secondary | ICD-10-CM

## 2013-11-16 DIAGNOSIS — R269 Unspecified abnormalities of gait and mobility: Secondary | ICD-10-CM

## 2013-11-16 DIAGNOSIS — Z8673 Personal history of transient ischemic attack (TIA), and cerebral infarction without residual deficits: Secondary | ICD-10-CM

## 2013-11-16 DIAGNOSIS — E1142 Type 2 diabetes mellitus with diabetic polyneuropathy: Secondary | ICD-10-CM | POA: Diagnosis present

## 2013-11-16 DIAGNOSIS — K661 Hemoperitoneum: Secondary | ICD-10-CM

## 2013-11-16 DIAGNOSIS — N390 Urinary tract infection, site not specified: Secondary | ICD-10-CM | POA: Diagnosis present

## 2013-11-16 DIAGNOSIS — R0989 Other specified symptoms and signs involving the circulatory and respiratory systems: Secondary | ICD-10-CM

## 2013-11-16 DIAGNOSIS — K449 Diaphragmatic hernia without obstruction or gangrene: Secondary | ICD-10-CM

## 2013-11-16 DIAGNOSIS — Z87440 Personal history of urinary (tract) infections: Secondary | ICD-10-CM | POA: Diagnosis not present

## 2013-11-16 DIAGNOSIS — B351 Tinea unguium: Secondary | ICD-10-CM

## 2013-11-16 DIAGNOSIS — R279 Unspecified lack of coordination: Secondary | ICD-10-CM

## 2013-11-16 DIAGNOSIS — Z9849 Cataract extraction status, unspecified eye: Secondary | ICD-10-CM | POA: Diagnosis not present

## 2013-11-16 DIAGNOSIS — N3000 Acute cystitis without hematuria: Secondary | ICD-10-CM

## 2013-11-16 DIAGNOSIS — I6990 Unspecified sequelae of unspecified cerebrovascular disease: Secondary | ICD-10-CM

## 2013-11-16 DIAGNOSIS — IMO0002 Reserved for concepts with insufficient information to code with codable children: Secondary | ICD-10-CM

## 2013-11-16 DIAGNOSIS — I6200 Nontraumatic subdural hemorrhage, unspecified: Secondary | ICD-10-CM | POA: Diagnosis present

## 2013-11-16 DIAGNOSIS — A498 Other bacterial infections of unspecified site: Secondary | ICD-10-CM | POA: Diagnosis present

## 2013-11-16 DIAGNOSIS — R6251 Failure to thrive (child): Secondary | ICD-10-CM

## 2013-11-16 DIAGNOSIS — E860 Dehydration: Secondary | ICD-10-CM | POA: Diagnosis present

## 2013-11-16 DIAGNOSIS — M1990 Unspecified osteoarthritis, unspecified site: Secondary | ICD-10-CM

## 2013-11-16 DIAGNOSIS — F411 Generalized anxiety disorder: Secondary | ICD-10-CM

## 2013-11-16 DIAGNOSIS — R6510 Systemic inflammatory response syndrome (SIRS) of non-infectious origin without acute organ dysfunction: Secondary | ICD-10-CM

## 2013-11-16 DIAGNOSIS — R350 Frequency of micturition: Secondary | ICD-10-CM

## 2013-11-16 DIAGNOSIS — E1129 Type 2 diabetes mellitus with other diabetic kidney complication: Secondary | ICD-10-CM | POA: Diagnosis present

## 2013-11-16 DIAGNOSIS — B029 Zoster without complications: Secondary | ICD-10-CM

## 2013-11-16 DIAGNOSIS — M1710 Unilateral primary osteoarthritis, unspecified knee: Secondary | ICD-10-CM

## 2013-11-16 DIAGNOSIS — N151 Renal and perinephric abscess: Secondary | ICD-10-CM

## 2013-11-16 DIAGNOSIS — R5383 Other fatigue: Secondary | ICD-10-CM | POA: Diagnosis not present

## 2013-11-16 DIAGNOSIS — J4489 Other specified chronic obstructive pulmonary disease: Secondary | ICD-10-CM | POA: Diagnosis present

## 2013-11-16 DIAGNOSIS — R079 Chest pain, unspecified: Secondary | ICD-10-CM

## 2013-11-16 DIAGNOSIS — K648 Other hemorrhoids: Secondary | ICD-10-CM

## 2013-11-16 LAB — CBC WITH DIFFERENTIAL/PLATELET
Basophils Absolute: 0 10*3/uL (ref 0.0–0.1)
Basophils Relative: 0 % (ref 0–1)
EOS ABS: 0.1 10*3/uL (ref 0.0–0.7)
EOS PCT: 1 % (ref 0–5)
HEMATOCRIT: 36 % (ref 36.0–46.0)
Hemoglobin: 12.1 g/dL (ref 12.0–15.0)
Lymphocytes Relative: 20 % (ref 12–46)
Lymphs Abs: 1.3 10*3/uL (ref 0.7–4.0)
MCH: 30.8 pg (ref 26.0–34.0)
MCHC: 33.6 g/dL (ref 30.0–36.0)
MCV: 91.6 fL (ref 78.0–100.0)
Monocytes Absolute: 0.7 10*3/uL (ref 0.1–1.0)
Monocytes Relative: 11 % (ref 3–12)
Neutro Abs: 4.2 10*3/uL (ref 1.7–7.7)
Neutrophils Relative %: 68 % (ref 43–77)
PLATELETS: 177 10*3/uL (ref 150–400)
RBC: 3.93 MIL/uL (ref 3.87–5.11)
RDW: 15.6 % — ABNORMAL HIGH (ref 11.5–15.5)
WBC: 6.3 10*3/uL (ref 4.0–10.5)

## 2013-11-16 LAB — COMPREHENSIVE METABOLIC PANEL
ALT: 15 U/L (ref 0–35)
AST: 25 U/L (ref 0–37)
Albumin: 3.5 g/dL (ref 3.5–5.2)
Alkaline Phosphatase: 84 U/L (ref 39–117)
Anion gap: 20 — ABNORMAL HIGH (ref 5–15)
BUN: 21 mg/dL (ref 6–23)
CALCIUM: 9.7 mg/dL (ref 8.4–10.5)
CO2: 19 mEq/L (ref 19–32)
CREATININE: 0.79 mg/dL (ref 0.50–1.10)
Chloride: 99 mEq/L (ref 96–112)
GFR calc non Af Amer: 73 mL/min — ABNORMAL LOW (ref >90)
GFR, EST AFRICAN AMERICAN: 85 mL/min — AB (ref >90)
Glucose, Bld: 118 mg/dL — ABNORMAL HIGH (ref 70–99)
Potassium: 4.3 mEq/L (ref 3.7–5.3)
Sodium: 138 mEq/L (ref 137–147)
TOTAL PROTEIN: 6.9 g/dL (ref 6.0–8.3)
Total Bilirubin: 1.1 mg/dL (ref 0.3–1.2)

## 2013-11-16 LAB — URINALYSIS, ROUTINE W REFLEX MICROSCOPIC
BILIRUBIN URINE: NEGATIVE
GLUCOSE, UA: NEGATIVE mg/dL
Ketones, ur: >80 mg/dL — AB
Nitrite: POSITIVE — AB
PROTEIN: NEGATIVE mg/dL
Specific Gravity, Urine: 1.022 (ref 1.005–1.030)
Urobilinogen, UA: 1 mg/dL (ref 0.0–1.0)
pH: 5.5 (ref 5.0–8.0)

## 2013-11-16 LAB — I-STAT TROPONIN, ED: Troponin i, poc: 0.01 ng/mL (ref 0.00–0.08)

## 2013-11-16 LAB — URINE MICROSCOPIC-ADD ON

## 2013-11-16 LAB — PRO B NATRIURETIC PEPTIDE: PRO B NATRI PEPTIDE: 1039 pg/mL — AB (ref 0–450)

## 2013-11-16 LAB — I-STAT CG4 LACTIC ACID, ED: Lactic Acid, Venous: 1.61 mmol/L (ref 0.5–2.2)

## 2013-11-16 LAB — GLUCOSE, CAPILLARY: GLUCOSE-CAPILLARY: 105 mg/dL — AB (ref 70–99)

## 2013-11-16 MED ORDER — ONDANSETRON HCL 4 MG PO TABS
4.0000 mg | ORAL_TABLET | Freq: Four times a day (QID) | ORAL | Status: DC | PRN
  Administered 2013-11-17: 4 mg via ORAL
  Filled 2013-11-16: qty 1

## 2013-11-16 MED ORDER — DEXTROSE 5 % IV SOLN
1.0000 g | Freq: Once | INTRAVENOUS | Status: AC
  Administered 2013-11-16: 1 g via INTRAVENOUS
  Filled 2013-11-16: qty 10

## 2013-11-16 MED ORDER — SODIUM CHLORIDE 0.9 % IV SOLN
INTRAVENOUS | Status: DC
  Administered 2013-11-16: 16:00:00 via INTRAVENOUS

## 2013-11-16 MED ORDER — DEXTROSE 5 % IV SOLN: 1.0000 g | INTRAVENOUS | Status: DC

## 2013-11-16 MED ORDER — PREDNISONE 1 MG PO TABS
3.0000 mg | ORAL_TABLET | Freq: Every day | ORAL | Status: DC
  Administered 2013-11-17 – 2013-11-19 (×3): 3 mg via ORAL
  Filled 2013-11-16 (×4): qty 3

## 2013-11-16 MED ORDER — ACETAMINOPHEN 325 MG PO TABS
650.0000 mg | ORAL_TABLET | Freq: Four times a day (QID) | ORAL | Status: DC | PRN
  Administered 2013-11-17 (×2): 650 mg via ORAL
  Filled 2013-11-16 (×2): qty 2

## 2013-11-16 MED ORDER — INSULIN ASPART 100 UNIT/ML ~~LOC~~ SOLN
0.0000 [IU] | Freq: Three times a day (TID) | SUBCUTANEOUS | Status: DC
  Administered 2013-11-18: 3 [IU] via SUBCUTANEOUS
  Administered 2013-11-18 – 2013-11-19 (×2): 2 [IU] via SUBCUTANEOUS
  Administered 2013-11-19: 3 [IU] via SUBCUTANEOUS

## 2013-11-16 MED ORDER — DEXTROSE 5 % IV SOLN
1.0000 g | INTRAVENOUS | Status: DC
  Administered 2013-11-17 – 2013-11-18 (×2): 1 g via INTRAVENOUS
  Filled 2013-11-16 (×3): qty 10

## 2013-11-16 MED ORDER — ACETAMINOPHEN 650 MG RE SUPP: 650.0000 mg | Freq: Four times a day (QID) | RECTAL | Status: DC | PRN

## 2013-11-16 MED ORDER — METOPROLOL TARTRATE 12.5 MG HALF TABLET
12.5000 mg | ORAL_TABLET | ORAL | Status: DC
  Administered 2013-11-18: 12.5 mg via ORAL
  Filled 2013-11-16 (×2): qty 1

## 2013-11-16 MED ORDER — OXYBUTYNIN CHLORIDE 5 MG PO TABS: 5.0000 mg | ORAL_TABLET | Freq: Three times a day (TID) | ORAL | Status: DC | PRN

## 2013-11-16 MED ORDER — SODIUM CHLORIDE 0.9 % IV SOLN: INTRAVENOUS | Status: DC

## 2013-11-16 MED ORDER — ACYCLOVIR 200 MG PO CAPS
200.0000 mg | ORAL_CAPSULE | Freq: Every day | ORAL | Status: DC
  Administered 2013-11-17 – 2013-11-19 (×4): 200 mg via ORAL
  Filled 2013-11-16 (×5): qty 1

## 2013-11-16 MED ORDER — ALUM & MAG HYDROXIDE-SIMETH 200-200-20 MG/5ML PO SUSP
30.0000 mL | Freq: Four times a day (QID) | ORAL | Status: DC | PRN
  Administered 2013-11-18: 30 mL via ORAL
  Filled 2013-11-16: qty 30

## 2013-11-16 MED ORDER — INSULIN ASPART 100 UNIT/ML ~~LOC~~ SOLN: 0.0000 [IU] | Freq: Every day | SUBCUTANEOUS | Status: DC

## 2013-11-16 MED ORDER — ONDANSETRON HCL 4 MG/2ML IJ SOLN
4.0000 mg | Freq: Four times a day (QID) | INTRAMUSCULAR | Status: DC | PRN
Start: 2013-11-16 — End: 2013-11-19

## 2013-11-16 NOTE — ED Notes (Signed)
Pt resting quietly at the time. EDP at bedside to updated patient and family. No signs of distress noted. Vital signs stable. Pt denies pain at the time.

## 2013-11-16 NOTE — ED Notes (Signed)
Pt states 8/10 diffuse chest pain at the time. Pt also states she feels very weak and fatigued. Reports poor appetite and unable to take home medications for several days. Respirations unlabored. Pt is alert and able to answer most questions appropriately. Skin warm and dry.

## 2013-11-16 NOTE — ED Provider Notes (Signed)
CSN: [id number]     Arrival date & time 11/16/13  1117 History   First MD Initiated Contact with Patient 11/16/13 1130     Chief Complaint  Patient presents with  . Fatigue  . Chest Pain     (Consider location/radiation/quality/duration/timing/severity/associated sxs/prior Treatment) HPI [name] is a 80 y.o. female with multiple medical problems, including recent subdural hematoma from a fall, just a week ago, who presents emergency department complaining of progressive weakness, confusion, chest pain, headache. Most of the history provided by patient's son. Patient's son states patient fell at [employer], hitting back of the head on the wall. Patient's fall occurred on 11/16/13. At that time she was seen in emergency department and admitted for observation. She was discharged with outpatient followup. Patient did return to emergency Department 4 days ago with her some complaining of increasing headache and weakness. Her CT was repeated, and she was discharged back home again. Patient's son states that now patient is not eating. She is more confused than normal. She is generally weak. Now complaining of chest pain and shortness of breath. Daily denies fever, chills. There is no vomiting. Patient is only able to walk with assistance, patient's son states she's been declining with walking for the last month. There has been no new injuries or falls.  Past Medical History  Diagnosis Date  . HTN (hypertension)   . Osteoporosis   . 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 COPD (chronic obstructive pulmonary disease)     no inhalers used   . PONV (postoperative nausea and vomiting)     in past, none recently  . History of TIA (transient ischemic attack) 2012--  NO RESIDUAL  . CHF (congestive heart failure)   . Arthritis     BACK , NECK, AND JOINTS  . Temporal arteritis   . Chronic steroid use   . 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 Macular degeneration of both eyes   . Diabetic peripheral neuropathy   . Insulin dependent diabetes mellitus   . Chronic cystitis   . Hemoperitoneum (nontraumatic)   . Urinary frequency   . Unspecified late effects of cerebrovascular disease   . Abnormality of gait   . Osteoporosis, unspecified   . Anxiety state, unspecified   . Herpes zoster with other nervous system complications(053.19)   . Anemia, unspecified   . Type I (juvenile type) diabetes mellitus without mention of complication, uncontrolled   . Other and unspecified hyperlipidemia   . Personality change due to conditions classified elsewhere   . Unspecified essential hypertension   . Dermatophytosis of nail   . Depressive disorder, not elsewhere classified   . Diaphragmatic hernia without mention of obstruction or gangrene   . Malignant neoplasm of renal pelvis    Past Surgical History  Procedure Laterality Date  . Lumbar laminectomy  11-19-2003    L3 -- L4  . Total hip arthroplasty Left 01-22-2005  . Total knee arthroplasty Bilateral LEFT 08-08-2007;  RIGHT 05-08-2009  . Anterior cervical decomp/discectomy fusion  10-12-2005    C5 -- C7  . Posterior fusion cervical spine  02-10-2006    C5 -- C7  . Orif w/ plate right distal radius fx  06-18-2003  . Cataract extraction w/ intraocular lens  implant, bilateral Right Oct 1992    Epes  . Cryoablation right renal mass  03-16-2012    ONCOCYTOMA (POST OP 06-09-2012 PERCUTANEOUS DRAIN PLACEMENT  FOR PERINEPHRIC ABSCESS)  . Temporal artery biopsy / ligation    . Elbow surgery Bilateral 2003  (APPROX)  . Carpal tunnel release Right 2009  (APPROX)  . Cystoscopy w/ ureteral stent placement Right 07/10/2012    Procedure: CYSTOSCOPY WITH RIGHT RETROGRADE PYELOGRAM/URETERAL STENT PLACEMENT;  Surgeon: Malka So, MD;  Location: [facility];  Service: Urology;  Laterality: Right;  . Hip arthroplasty Right 08/03/2012    Procedure: CEMENTED  MONOPOLAR HIP;  Surgeon: Marybelle Killings, MD;  Location: [facility];  Service: Orthopedics;  Laterality: Right;  . Cataract extraction w/ intraocular lens implant Left 1993    Epes   Family History  Problem Relation Age of Onset  . Leukemia    . Cancer Mother   . Cancer Father   . Cancer Brother   . Cancer Daughter    History  Substance Use Topics  . Smoking status: Former Smoker -- 2.00 packs/day for 30 years    Types: Cigarettes    Quit date: 01/11/1983  . Smokeless tobacco: Never Used  . Alcohol Use: No   OB History   Grav Para Term Preterm Abortions TAB SAB Ect Mult Living                 Review of Systems  Constitutional: Positive for fatigue. Negative for fever and chills.  Respiratory: Positive for chest tightness and shortness of breath. Negative for cough.   Cardiovascular: Positive for chest pain. Negative for palpitations and leg swelling.  Gastrointestinal: Negative for nausea, vomiting, abdominal pain and diarrhea.  Genitourinary: Negative for dysuria, flank pain and pelvic pain.  Musculoskeletal: Negative for arthralgias, myalgias, neck pain and neck stiffness.  Skin: Negative for rash.  Neurological: Positive for weakness and headaches. Negative for dizziness.  All other systems reviewed and are negative.     Allergies  Allegra; Levaquin; Tussionex pennkinetic er; Codeine; and Morphine and related  Home Medications   Prior to Admission medications   Medication Sig Start Date End Date Taking? Authorizing Provider  acyclovir (ZOVIRAX) 200 MG capsule Take 200 mg by mouth daily. To prevent relapse of herpes simplex.    Historical Provider, MD  amitriptyline (ELAVIL) 50 MG tablet Take 1 tablet (50 mg total) by mouth at bedtime. 09/04/13   Estill Dooms, MD  aspirin EC 325 MG tablet Take 325 mg by mouth daily.     Historical Provider, MD  Cranberry 475 MG CAPS Take 1-2 capsules by mouth 2 (two) times daily. Take 1 capsule every morning and 2 capsules every night.     Historical Provider, MD  furosemide (LASIX) 20 MG tablet Take 20 mg by mouth daily.    Historical Provider, MD  gabapentin (NEURONTIN) 300 MG capsule Take 300-600 mg by mouth 2 (two) times daily. Take 300 mg every morning and 600 mg every night at bedtime.    Historical Provider, MD  insulin aspart (NOVOLOG) 100 UNIT/ML injection Inject 0-15 Units into the skin as needed. Sugar level less than 150 do not take any Novolog, if sugar level is between 151-200 take 3 units, 201-250 take 5 units, 251-300 take 7 units 301-350 take 9 units, 351-400 take 11 units, sugar level over 400 take 15 units and notify docotor 08/27/13   Blanchie Serve, MD  insulin detemir (LEVEMIR) 100 UNIT/ML injection Inject 30 Units into the skin at bedtime.    Historical Provider, MD  meloxicam (MOBIC) 15 MG tablet Take 15 mg by mouth daily.  09/04/13   Arnell Sieving  Joaquin Courts, MD  metoprolol tartrate (LOPRESSOR) 12.5 mg TABS tablet Take 12.5 mg by mouth 3 (three) times a week.    Historical Provider, MD  Multiple Vitamin (MULTIVITAMIN WITH MINERALS) TABS Take 1 tablet by mouth daily.    Historical Provider, MD  oxybutynin (DITROPAN) 5 MG tablet Take 5 mg by mouth 3 (three) times daily as needed for bladder spasms (Up to 3 times daily to control bladder spasms.).    Historical Provider, MD  predniSONE (DELTASONE) 1 MG tablet Take 3 mg by mouth daily with breakfast.    Historical Provider, MD  traMADol (ULTRAM) 50 MG tablet Take 50 mg by mouth every 8 (eight) hours as needed (pain.).    Historical Provider, MD   BP 138/74  Pulse 82  Temp(Src) 98.2 F (36.8 C) (Oral)  Resp 18  SpO2 96% Physical Exam  Nursing note and vitals reviewed. Constitutional: She appears well-developed and well-nourished. No distress.  HENT:  Head: Normocephalic.  Eyes: Conjunctivae and EOM are normal. Pupils are equal, round, and reactive to light.  Neck: Neck supple.  Cardiovascular: Normal rate, regular rhythm and normal heart sounds.   Pulmonary/Chest:  Effort normal and breath sounds normal. No respiratory distress. She has no wheezes. She has no rales.  Abdominal: Soft. Bowel sounds are normal. She exhibits no distension. There is no tenderness. There is no rebound.  Musculoskeletal: She exhibits no edema.  Neurological: She is alert. She has normal reflexes. No cranial nerve deficit. Coordination normal.  Pt oriented to self only. 5/5 and equal upper and lower extremity strength bilaterally. Equal grip strength bilaterally. Normal finger to nose and heel to shin. No pronator drift.   Skin: Skin is warm and dry.  Psychiatric: Her behavior is normal.  Anxious appearing    ED Course  Procedures (including critical care time) Labs Review Labs Reviewed  CBC WITH DIFFERENTIAL - Abnormal; Notable for the following:    RDW 15.6 (*)    All other components within normal limits  COMPREHENSIVE METABOLIC PANEL - Abnormal; Notable for the following:    Glucose, Bld 118 (*)    GFR calc non Af Amer 73 (*)    GFR calc Af Amer 85 (*)    Anion gap 20 (*)    All other components within normal limits  PRO B NATRIURETIC PEPTIDE - Abnormal; Notable for the following:    Pro B Natriuretic peptide (BNP) 1039.0 (*)    All other components within normal limits  URINALYSIS, ROUTINE W REFLEX MICROSCOPIC - Abnormal; Notable for the following:    Color, Urine AMBER (*)    APPearance CLOUDY (*)    Hgb urine dipstick TRACE (*)    Ketones, ur >80 (*)    Nitrite POSITIVE (*)    Leukocytes, UA SMALL (*)    All other components within normal limits  URINE MICROSCOPIC-ADD ON - Abnormal; Notable for the following:    Bacteria, UA MANY (*)    All other components within normal limits  URINE CULTURE  I-STAT TROPOININ, ED  I-STAT CG4 LACTIC ACID, ED    Imaging Review Ct Head Wo Contrast  11/16/2013   CLINICAL DATA:  79 year old female with weakness and fatigue. Recent left frontotemporal subdural hematoma.  EXAM: CT HEAD WITHOUT CONTRAST  TECHNIQUE:  Contiguous axial images were obtained from the base of the skull through the vertex without intravenous contrast.  COMPARISON:  11/13/1998 and and and prior head CTs dating back to 06/21/2006  FINDINGS: A left frontotemporal subdural hematoma  is unchanged in size but decreased in attenuation measuring up to 5 mm in the frontal region. Edema within the inferior left frontal lobe again noted.  Mild atrophy and chronic small-vessel white matter ischemic changes are again noted.  There is no evidence of new hemorrhage, acute infarction, hydrocephalus, new extra-axial collection or midline shift.  No acute bony abnormalities are present. Opacification of the visualized left maxillary sinus again noted.  IMPRESSION: Unchanged size of left frontotemporal subdural hematoma with edema in the inferior left frontal lobe again noted.  No new or acute finding identified.  Chronic small-vessel white matter ischemic changes and mild atrophy.   Electronically Signed   By: Hassan Rowan M.D.   On: 11/16/2013 14:23   Dg Chest Portable 1 View  11/16/2013   CLINICAL DATA:  Short of breath and fatigue.  EXAM: PORTABLE CHEST - 1 VIEW  COMPARISON:  Chest radiograph 08/02/2012  FINDINGS: The heart size and mediastinal contours are within normal limits. Calcified atherosclerotic plaque noted within the aortic knob. Both lungs are clear. The visualized skeletal structures are unremarkable.  IMPRESSION: No active disease.   Electronically Signed   By: Kerby Moors M.D.   On: 11/16/2013 13:24     EKG Interpretation   Date/Time:  Saturday November 16 2013 11:32:58 EDT Ventricular Rate:  88 PR Interval:  152 QRS Duration: 107 QT Interval:  378 QTC Calculation: 457 R Axis:   39 Text Interpretation:  Sinus rhythm Premature atrial complexes No  significant change since last tracing Confirmed by Ashok Cordia  MD, Lennette Bihari  ([id number]) on 11/16/2013 3:52:36 PM      MDM   Final diagnoses:  UTI (lower urinary tract infection)  Failure to  thrive in child  Chest pain, unspecified chest pain type  Subdural hematoma   Patient is coming from home with increased weakness, confusion, and difficulty walking, headache, chest pain. Patient admitted 10 days ago after a fall and sustained a subdural hematoma. According to his son since then gradual decline in her mental status and progressive weakness. Pt is only oriented to self. She appears anxious. Complaining of chest pain and headache. Will repeat CT, labs and UA ordered.    Pt's CT is unchanged. CXR negaitve. Pt's labs unremarkable. UA infected. Looking through records, it appears pt most likely had a UTI 10 days ago as well, but i could not find where it was addressed or treated. Will send cultues today and start on rocephin. Pt's chest pain work up so far unremarkable. Will admit for CP work up and UTI, as well as failure to thrive.   Filed Vitals:   11/16/13 1136 11/16/13 1430  BP: 138/74 125/67  Pulse: 82 82  Temp: 98.2 F (36.8 C)   TempSrc: Oral   Resp: 18 18  SpO2: 96% 100%      Florene Route Shoua Ressler, PA-C 11/16/13 1612

## 2013-11-16 NOTE — ED Notes (Signed)
Pt transported to CT ?

## 2013-11-16 NOTE — H&P (Signed)
[facility] History and Physical  [name] OIN:[id number] DOB: 1924/08/24 DOA: 11/16/2013  Referring physician:  PCP: Estill Dooms, MD   Chief Complaint: Functional Decline  HPI: [name] is a 80 y.o. female with a past medical history of diabetes, hypertension, recently discharged from the medicine service on 11/08/2013, admitted on 11/06/2013 after having a fall at home. Initial imaging studies revealed a 6 mm left frontal and temporal lobe subdural hematoma. Patient remained stable from a neurologic standpoint. she had a repeat head CT on 11/12/2013 which showed stable left frontal and left temporal subdural hematomas with no interim intracranial change. Over the past several days she has had a steep functional decline, having generalized weakness, poor tolerance to physical exertion, excessive daytime sleepiness, minimal by mouth intake and increased confusion. it appears she had an emergency room visit on 11/12/2013 presenting with complaints of dizziness. She does not have neurological deficits at that time with head CT not revealing acute abnormalities. She was discharged in stable condition at that time. Patient is presently confused, cannot provide reliable history, family members not present. History obtained from [facility] and emergency room staff. Urinalysis obtained in the emergency room showed urinary tract infection, she was administered ceftriaxone IV.                                                                                                                                                                                                                                                                                                                                                                              Review of Systems:  Cannot obtain reliable review of systems given encephalopathy  Past Medical History  Diagnosis Date  . HTN (  hypertension)   .  Osteoporosis   . 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 COPD (chronic obstructive pulmonary disease)     no inhalers used   . PONV (postoperative nausea and vomiting)     in past, none recently  . History of TIA (transient ischemic attack) 2012--  NO RESIDUAL  . CHF (congestive heart failure)   . Arthritis     BACK , NECK, AND JOINTS  . Temporal arteritis   . Chronic steroid use   . 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 Macular degeneration of both eyes   . Diabetic peripheral neuropathy   . Insulin dependent diabetes mellitus   . Chronic cystitis   . Hemoperitoneum (nontraumatic)   . Urinary frequency   . Unspecified late effects of cerebrovascular disease   . Abnormality of gait   . Osteoporosis, unspecified   . Anxiety state, unspecified   . Herpes zoster with other nervous system complications(053.19)   . Anemia, unspecified   . Type I (juvenile type) diabetes mellitus without mention of complication, uncontrolled   . Other and unspecified hyperlipidemia   . Personality change due to conditions classified elsewhere   . Unspecified essential hypertension   . Dermatophytosis of nail   . Depressive disorder, not elsewhere classified   . Diaphragmatic hernia without mention of obstruction or gangrene   . Malignant neoplasm of renal pelvis    Past Surgical History  Procedure Laterality Date  . Lumbar laminectomy  11-19-2003    L3 -- L4  . Total hip arthroplasty Left 01-22-2005  . Total knee arthroplasty Bilateral LEFT 08-08-2007;  RIGHT 05-08-2009  . Anterior cervical decomp/discectomy fusion  10-12-2005    C5 -- C7  . Posterior fusion cervical spine  02-10-2006    C5 -- C7  . Orif w/ plate right distal radius fx  06-18-2003  . Cataract extraction w/ intraocular lens  implant, bilateral Right Oct 1992    Epes  . Cryoablation right renal mass  03-16-2012    ONCOCYTOMA (POST OP 06-09-2012  PERCUTANEOUS DRAIN PLACEMENT FOR PERINEPHRIC ABSCESS)  . Temporal artery biopsy / ligation    . Elbow surgery Bilateral 2003  (APPROX)  . Carpal tunnel release Right 2009  (APPROX)  . Cystoscopy w/ ureteral stent placement Right 07/10/2012    Procedure: CYSTOSCOPY WITH RIGHT RETROGRADE PYELOGRAM/URETERAL STENT PLACEMENT;  Surgeon: Malka So, MD;  Location: [facility];  Service: Urology;  Laterality: Right;  . Hip arthroplasty Right 08/03/2012    Procedure: CEMENTED MONOPOLAR HIP;  Surgeon: Marybelle Killings, MD;  Location: [facility];  Service: Orthopedics;  Laterality: Right;  . Cataract extraction w/ intraocular lens implant Left 1993    Epes   Social History:  reports that she quit smoking about 30 years ago. Her smoking use included Cigarettes. She has a 60 pack-year smoking history. She has never used smokeless tobacco. She reports that she does not drink alcohol or use illicit drugs.  Allergies  Allergen Reactions  . Allegra [Fexofenadine Hcl]     Unknown.  Mack Hook [Levofloxacin In D5w]     Unknown.   . Tussionex Pennkinetic Er [Hydrocod Polst-Cpm Polst Er]     Unknown  . Codeine Nausea And Vomiting  . Morphine And Related Nausea And Vomiting    Family History  Problem Relation Age of Onset  . Leukemia    . Cancer Mother   . Cancer Father   .  Cancer Brother   . Cancer Daughter      Prior to Admission medications   Medication Sig Start Date End Date Taking? Authorizing Provider  acyclovir (ZOVIRAX) 200 MG capsule Take 200 mg by mouth daily. To prevent relapse of herpes simplex.    Historical Provider, MD  amitriptyline (ELAVIL) 50 MG tablet Take 1 tablet (50 mg total) by mouth at bedtime. 09/04/13   Estill Dooms, MD  aspirin EC 325 MG tablet Take 325 mg by mouth daily.     Historical Provider, MD  Cranberry 475 MG CAPS Take 1-2 capsules by mouth 2 (two) times daily. Take 1 capsule every morning and 2 capsules every night.    Historical Provider, MD  furosemide  (LASIX) 20 MG tablet Take 20 mg by mouth daily.    Historical Provider, MD  gabapentin (NEURONTIN) 300 MG capsule Take 300-600 mg by mouth 2 (two) times daily. Take 300 mg every morning and 600 mg every night at bedtime.    Historical Provider, MD  insulin aspart (NOVOLOG) 100 UNIT/ML injection Inject 0-15 Units into the skin as needed. Sugar level less than 150 do not take any Novolog, if sugar level is between 151-200 take 3 units, 201-250 take 5 units, 251-300 take 7 units 301-350 take 9 units, 351-400 take 11 units, sugar level over 400 take 15 units and notify docotor 08/27/13   Blanchie Serve, MD  insulin detemir (LEVEMIR) 100 UNIT/ML injection Inject 30 Units into the skin at bedtime.    Historical Provider, MD  meloxicam (MOBIC) 15 MG tablet Take 15 mg by mouth daily.  09/04/13   Estill Dooms, MD  metoprolol tartrate (LOPRESSOR) 12.5 mg TABS tablet Take 12.5 mg by mouth 3 (three) times a week.    Historical Provider, MD  Multiple Vitamin (MULTIVITAMIN WITH MINERALS) TABS Take 1 tablet by mouth daily.    Historical Provider, MD  oxybutynin (DITROPAN) 5 MG tablet Take 5 mg by mouth 3 (three) times daily as needed for bladder spasms (Up to 3 times daily to control bladder spasms.).    Historical Provider, MD  predniSONE (DELTASONE) 1 MG tablet Take 3 mg by mouth daily with breakfast.    Historical Provider, MD  traMADol (ULTRAM) 50 MG tablet Take 50 mg by mouth every 8 (eight) hours as needed (pain.).    Historical Provider, MD   Physical Exam: Filed Vitals:   11/16/13 1136 11/16/13 1430  BP: 138/74 125/67  Pulse: 82 82  Temp: 98.2 F (36.8 C)   TempSrc: Oral   Resp: 18 18  SpO2: 96% 100%    Wt Readings from Last 3 Encounters:  11/07/13 71.1 kg (156 lb 12 oz)  09/04/13 69.4 kg (153 lb)  06/28/13 70.761 kg (156 lb)    General:  Ill-appearing, in mild distress. She can follow simple commands however appears confused and disoriented Eyes: PERRL, normal lids, irises & conjunctiva ENT:  grossly normal hearing, lips & tongue, dry oral mucosa Neck: no LAD, masses or thyromegaly, did not appreciate nuchal rigidity Cardiovascular: RRR, no m/r/g. No LE edema. Telemetry: SR, no arrhythmias  Respiratory: CTA bilaterally, no w/r/r. Normal respiratory effort. Abdomen: soft, having mild to moderate tenderness to palpation over lower, region, no rebound tenderness or guarding Skin: no rash or induration seen on limited exam Musculoskeletal: grossly normal tone BUE/BLE Psychiatric: Patient is confused, disoriented, can't follow simple commands Neurologic: Has a nonfocal neurologic examination, did not appreciate facial droop or slurred speech. Has global 5 of 5 muscle  strength to bilateral extremities.           Labs on Admission:  Basic Metabolic Panel:  Recent Labs Lab 11/12/13 2127 11/16/13 1206  NA 140 138  K 4.7 4.3  CL 102 99  CO2 25 19  GLUCOSE 190* 118*  BUN 43* 21  CREATININE 1.28* 0.79  CALCIUM 9.7 9.7   Liver Function Tests:  Recent Labs Lab 11/16/13 1206  AST 25  ALT 15  ALKPHOS 84  BILITOT 1.1  PROT 6.9  ALBUMIN 3.5   No results found for this basename: LIPASE, AMYLASE,  in the last 168 hours No results found for this basename: AMMONIA,  in the last 168 hours CBC:  Recent Labs Lab 11/12/13 2127 11/16/13 1206  WBC 6.6 6.3  NEUTROABS 4.0 4.2  HGB 11.0* 12.1  HCT 33.2* 36.0  MCV 93.8 91.6  PLT 169 177   Cardiac Enzymes: No results found for this basename: CKTOTAL, CKMB, CKMBINDEX, TROPONINI,  in the last 168 hours  BNP (last 3 results)  Recent Labs  11/16/13 1206  PROBNP 1039.0*   CBG: No results found for this basename: GLUCAP,  in the last 168 hours  Radiological Exams on Admission: Ct Head Wo Contrast  11/16/2013   CLINICAL DATA:  80 year old female with weakness and fatigue. Recent left frontotemporal subdural hematoma.  EXAM: CT HEAD WITHOUT CONTRAST  TECHNIQUE: Contiguous axial images were obtained from the base of the skull  through the vertex without intravenous contrast.  COMPARISON:  11/13/1998 and and and prior head CTs dating back to 06/21/2006  FINDINGS: A left frontotemporal subdural hematoma is unchanged in size but decreased in attenuation measuring up to 5 mm in the frontal region. Edema within the inferior left frontal lobe again noted.  Mild atrophy and chronic small-vessel white matter ischemic changes are again noted.  There is no evidence of new hemorrhage, acute infarction, hydrocephalus, new extra-axial collection or midline shift.  No acute bony abnormalities are present. Opacification of the visualized left maxillary sinus again noted.  IMPRESSION: Unchanged size of left frontotemporal subdural hematoma with edema in the inferior left frontal lobe again noted.  No new or acute finding identified.  Chronic small-vessel white matter ischemic changes and mild atrophy.   Electronically Signed   By: Hassan Rowan M.D.   On: 11/16/2013 14:23   Dg Chest Portable 1 View  11/16/2013   CLINICAL DATA:  Short of breath and fatigue.  EXAM: PORTABLE CHEST - 1 VIEW  COMPARISON:  Chest radiograph 08/02/2012  FINDINGS: The heart size and mediastinal contours are within normal limits. Calcified atherosclerotic plaque noted within the aortic knob. Both lungs are clear. The visualized skeletal structures are unremarkable.  IMPRESSION: No active disease.   Electronically Signed   By: Kerby Moors M.D.   On: 11/16/2013 13:24    EKG: Independently reviewed.   Assessment/Plan Principal Problem:   Acute encephalopathy Active Problems:   UTI (lower urinary tract infection)   FTT (failure to thrive) in adult   Type II or unspecified type diabetes mellitus with renal manifestations, uncontrolled(250.42)   HTN (hypertension)   Recurrent UTI   Chronic kidney disease, stage III (moderate)   1. Acute encephalopathy. I suspect secondary to urinary tract infection, with urinalysis showing the presence of many bacteria, nitrates and  leukocyte Estrace. I think she is also dehydrated which is likely contributing to encephalopathy as well. She has had minimal by mouth intake over the past several days. Review of medication showing that  she is on several psychotropic medicines which may be contributing as well. Will hold amitriptyline, Neurontin and tramadol for now. Will provide gentle IV fluid hydration with normal saline running at 60 mL/hour, continue ceftriaxone 1 g IV every 24 hours, physical therapy consultation, reassess in a.m. 2. Urinary tract infection. As mentioned above urinalysis showed the presence of many bacteria, nitrates and leukocyte Estrace. Will obtain blood cultures and urine cultures, start empiric IV antibiotic therapy with ceftriaxone 1 g IV every 24 hours. A urine culture obtained in April of 2015 grew greater than 100,000 colony-forming units of Escherichia coli organism susceptible to cephalosporins. Will follow. 3. History of subdural hematoma. Patient having a fall recently that resulted in subdural hematoma. Has had multiple CT scans in the interim, including one done today in the emergency room that revealed left frontal temporal subdural hematoma unchanged in size, no new or acute finding identified. Provide SCD for DVT prophylaxis 4. Hypertension. Patient's blood pressure stable in the emergency room having blood pressure 125/67. Will monitor 5. Type 2 diabetes mellitus. Provide Accu-Cheks q. a.c. each bedtime with sliding scale coverage 6. History of chronic diastolic congestive heart failure. Last transthoracic echocardiogram performed on 01/31/2011 showing ejection fraction of 35-59%, grade 1 diastolic dysfunction. I believe she may be dehydrated for which I will hold Lasix and provide gentle IV fluid resuscitation with normal saline at 60 mL/hour. Carefully monitor volume status. 7. DVT prophylaxis. SCDs   Code Status: Per medical records patient is a full code, family members not present in the  emergency room to confirm with, I attempted to call his son on cell, no response. Family Communication: Attempt made to call son Disposition Plan: Will admit patient to MedSurg, and suspicion require greater than 2 night hospitalization  Time spent: 70 min  Kelvin Cellar [facility] Pager [phone]  **Disclaimer: This note may have been dictated with voice recognition software. Similar sounding words can inadvertently be transcribed and this note may contain transcription errors which may not have been corrected upon publication of note.**

## 2013-11-16 NOTE — ED Notes (Signed)
Pt transported to CT scan.

## 2013-11-16 NOTE — ED Notes (Signed)
Pt presents to department via [facility] from home for evaluation of increasing weakness and chest pain. Family member states she fell on 9/2, was admitted to [facility] with subdural hematoma, now states she is more lethargic and weak. States decreased appetite, and unable to take home medications for several days. Pt states 8/10 diffuse chest pain at the time. Respirations unlabored. Pt is alert and answering questions appropriately.

## 2013-11-17 DIAGNOSIS — M6281 Muscle weakness (generalized): Secondary | ICD-10-CM

## 2013-11-17 LAB — GLUCOSE, CAPILLARY
GLUCOSE-CAPILLARY: 91 mg/dL (ref 70–99)
Glucose-Capillary: 104 mg/dL — ABNORMAL HIGH (ref 70–99)
Glucose-Capillary: 122 mg/dL — ABNORMAL HIGH (ref 70–99)
Glucose-Capillary: 112 mg/dL — ABNORMAL HIGH (ref 70–99)
Glucose-Capillary: 110 mg/dL — ABNORMAL HIGH (ref 70–99)

## 2013-11-17 LAB — BASIC METABOLIC PANEL
Anion gap: 20 — ABNORMAL HIGH (ref 5–15)
BUN: 24 mg/dL — ABNORMAL HIGH (ref 6–23)
CHLORIDE: 100 meq/L (ref 96–112)
CO2: 20 mEq/L (ref 19–32)
Calcium: 9.4 mg/dL (ref 8.4–10.5)
Creatinine, Ser: 0.82 mg/dL (ref 0.50–1.10)
GFR calc Af Amer: 73 mL/min — ABNORMAL LOW (ref >90)
GFR calc non Af Amer: 63 mL/min — ABNORMAL LOW (ref >90)
Glucose, Bld: 120 mg/dL — ABNORMAL HIGH (ref 70–99)
POTASSIUM: 4.3 meq/L (ref 3.7–5.3)
SODIUM: 140 meq/L (ref 137–147)

## 2013-11-17 LAB — CBC
HCT: 37 % (ref 36.0–46.0)
HEMOGLOBIN: 12.3 g/dL (ref 12.0–15.0)
MCH: 30.4 pg (ref 26.0–34.0)
MCHC: 33.2 g/dL (ref 30.0–36.0)
MCV: 91.6 fL (ref 78.0–100.0)
PLATELETS: 198 10*3/uL (ref 150–400)
RBC: 4.04 MIL/uL (ref 3.87–5.11)
RDW: 15.8 % — ABNORMAL HIGH (ref 11.5–15.5)
WBC: 6.6 10*3/uL (ref 4.0–10.5)

## 2013-11-17 MED ORDER — INFLUENZA VAC SPLIT QUAD 0.5 ML IM SUSY
0.5000 mL | PREFILLED_SYRINGE | INTRAMUSCULAR | Status: AC
  Administered 2013-11-18: 0.5 mL via INTRAMUSCULAR
  Filled 2013-11-17: qty 0.5

## 2013-11-17 NOTE — Progress Notes (Signed)
Patient ID: [name]  female  SEG:[id number]    DOB: May 04, 1924    DOA: 11/16/2013  PCP: Estill Dooms, MD  Assessment/Plan: Principal Problem:   Acute encephalopathy: Improving likely due to UTI, failure to thrive, worsening dementia, chronic subdural hematomas - Patient is still somewhat confused, per her son at the bedside, she has had Falls and poor by mouth intake - PT evaluation recommended skilled nursing facility  Active Problems:   Type II or unspecified type diabetes mellitus with renal manifestations, uncontrolled(250.42) - Continue sliding scale insulin    HTN (hypertension) - Currently stable  Urinary tract infection - Urine culture obtained, follow sensitivities, continue IV Rocephin    Chronic kidney disease, stage III (moderate) - Currently stable, continue to monitor counts    FTT (failure to thrive) in adult, With moderate malnutrition - Obtain nutrition consult - PT recommended skilled nursing facility, placed social worker consult   falls with chronic subdural hematomas -CT head obtained Showed unchanged size of left frontotemporal subdural hematoma with edema in the inferior left frontal lobe again noted, chronic small vessel white matter changes with mild atrophy   DVT Prophylaxis: SCD's  Code Status: FC  Family Communication: discussed with patient's son at the bedside   Disposition:  Consultants:  None   Procedures:  None   Antibiotics:  IV Rocephin     Subjective: Patient somewhat confused, does not feel too good, no fevers or chills or nausea vomiting   Objective: Weight change:   Intake/Output Summary (Last 24 hours) at 11/17/13 1145 Last data filed at 11/16/13 1914  Gross per 24 hour  Intake      0 ml  Output    150 ml  Net   -150 ml   Blood pressure 143/82, pulse 80, temperature 97.7 F (36.5 C), temperature source Oral, resp. rate 16, height 5\' 4"  (1.626 m), weight 70.1 kg (154 lb 8.7 oz), SpO2  100.00%.  Physical Exam: General: Alert and awake, oriented to self, NAD  CVS: S1-S2 clear, no murmur rubs or gallops Chest:  bibasal crackles  Abdomen: soft nontender, nondistended, normal bowel sounds  Extremities: no cyanosis, clubbing or edema noted bilaterally Neuro: Cranial nerves II-XII intact, no focal neurological deficits  Lab Results: Basic Metabolic Panel:  Recent Labs Lab 11/12/13 2127 11/16/13 1206  NA 140 138  K 4.7 4.3  CL 102 99  CO2 25 19  GLUCOSE 190* 118*  BUN 43* 21  CREATININE 1.28* 0.79  CALCIUM 9.7 9.7   Liver Function Tests:  Recent Labs Lab 11/16/13 1206  AST 25  ALT 15  ALKPHOS 84  BILITOT 1.1  PROT 6.9  ALBUMIN 3.5   No results found for this basename: LIPASE, AMYLASE,  in the last 168 hours No results found for this basename: AMMONIA,  in the last 168 hours CBC:  Recent Labs Lab 11/12/13 2127 11/16/13 1206  WBC 6.6 6.3  NEUTROABS 4.0 4.2  HGB 11.0* 12.1  HCT 33.2* 36.0  MCV 93.8 91.6  PLT 169 177   Cardiac Enzymes: No results found for this basename: CKTOTAL, CKMB, CKMBINDEX, TROPONINI,  in the last 168 hours BNP: No components found with this basename: POCBNP,  CBG:  Recent Labs Lab 11/16/13 1738 11/16/13 2136 11/17/13 0803  GLUCAP 105* 104* 122*     Micro Results: No results found for this or any previous visit (from the past 240 hour(s)).  Studies/Results: Ct Head Wo Contrast  11/16/2013   CLINICAL DATA:  80 year old  female with weakness and fatigue. Recent left frontotemporal subdural hematoma.  EXAM: CT HEAD WITHOUT CONTRAST  TECHNIQUE: Contiguous axial images were obtained from the base of the skull through the vertex without intravenous contrast.  COMPARISON:  11/13/1998 and and and prior head CTs dating back to 06/21/2006  FINDINGS: A left frontotemporal subdural hematoma is unchanged in size but decreased in attenuation measuring up to 5 mm in the frontal region. Edema within the inferior left frontal lobe  again noted.  Mild atrophy and chronic small-vessel white matter ischemic changes are again noted.  There is no evidence of new hemorrhage, acute infarction, hydrocephalus, new extra-axial collection or midline shift.  No acute bony abnormalities are present. Opacification of the visualized left maxillary sinus again noted.  IMPRESSION: Unchanged size of left frontotemporal subdural hematoma with edema in the inferior left frontal lobe again noted.  No new or acute finding identified.  Chronic small-vessel white matter ischemic changes and mild atrophy.   Electronically Signed   By: Hassan Rowan M.D.   On: 11/16/2013 14:23   Ct Head Wo Contrast  11/12/2013   CLINICAL DATA:  Dizziness.  EXAM: CT HEAD WITHOUT CONTRAST  TECHNIQUE: Contiguous axial images were obtained from the base of the skull through the vertex without intravenous contrast.  COMPARISON:  11/06/2013.  FINDINGS: Left frontal and left temporal subdural hematomas again noted. No interim change. No mass. No hydrocephalus. No hemorrhage. White matter changes noted consistent chronic ischemia. Orbits are unremarkable. No acute bony abnormality. Opacification of left mastoid sinus. Fracture of the left inferior orbital rim/ anterior left maxillary sinus and lateral wall of the left maxillary sinus noted.  IMPRESSION: 1. Stable left frontal and left temporal subdural hematomas again noted. No interim intracranial change. No acute abnormality new abnormality. 2. Opacification left maxillary sinus with left anterior orbital rim /anterior maxillary sinus and lateral wall left maxillary sinus fractures.   Electronically Signed   By: Marcello Moores  Register   On: 11/12/2013 21:47   Ct Head Wo Contrast  11/06/2013   CLINICAL DATA:  Dizziness and posterior head laceration/hematoma following fall  EXAM: CT HEAD WITHOUT CONTRAST  CT CERVICAL SPINE WITHOUT CONTRAST  TECHNIQUE: Multidetector CT imaging of the head and cervical spine was performed following the standard  protocol without intravenous contrast. Multiplanar CT image reconstructions of the cervical spine were also generated.  COMPARISON:  05/17/2012  FINDINGS: CT HEAD FINDINGS  There is prominence of the sulci and ventricles compatible with brain atrophy. Diffuse low attenuation throughout the subcortical and periventricular white matter is noted compatible with brain atrophy. There is a left frontal and temporal subdural hematoma which measures 6 mm and appears acute. No significant mass effect upon the adjacent brain parenchyma. There is opacification of the left maxillary sinus. Remaining paranasal sinuses are clear. The calvarium appears intact.  CT CERVICAL SPINE FINDINGS  Straightening of normal cervical lordosis. The bones appear osteopenic. The patient is status post fusion of C5-C7. There is degenerative disc disease at the C7-T1 level. Cerclage wire fixation of the C5 through C7 spinous processes noted. The prevertebral soft tissue space appears normal. There is no fracture.  IMPRESSION: 1. Acute left frontal and temporal subdural hematoma. 2. Small vessel ischemic disease and brain atrophy. 3. No evidence for cervical spine fracture 4. Postsurgical changes from fusion of C5 through C7. Critical Value/emergent results were called by telephone at the time of interpretation on 11/06/2013 at 4:52 pm to Dr. Shirlyn Goltz , who verbally acknowledged these results.  Electronically Signed   By: Kerby Moors M.D.   On: 11/06/2013 16:54   Ct Cervical Spine Wo Contrast  11/06/2013   CLINICAL DATA:  Dizziness and posterior head laceration/hematoma following fall  EXAM: CT HEAD WITHOUT CONTRAST  CT CERVICAL SPINE WITHOUT CONTRAST  TECHNIQUE: Multidetector CT imaging of the head and cervical spine was performed following the standard protocol without intravenous contrast. Multiplanar CT image reconstructions of the cervical spine were also generated.  COMPARISON:  05/17/2012  FINDINGS: CT HEAD FINDINGS  There is prominence  of the sulci and ventricles compatible with brain atrophy. Diffuse low attenuation throughout the subcortical and periventricular white matter is noted compatible with brain atrophy. There is a left frontal and temporal subdural hematoma which measures 6 mm and appears acute. No significant mass effect upon the adjacent brain parenchyma. There is opacification of the left maxillary sinus. Remaining paranasal sinuses are clear. The calvarium appears intact.  CT CERVICAL SPINE FINDINGS  Straightening of normal cervical lordosis. The bones appear osteopenic. The patient is status post fusion of C5-C7. There is degenerative disc disease at the C7-T1 level. Cerclage wire fixation of the C5 through C7 spinous processes noted. The prevertebral soft tissue space appears normal. There is no fracture.  IMPRESSION: 1. Acute left frontal and temporal subdural hematoma. 2. Small vessel ischemic disease and brain atrophy. 3. No evidence for cervical spine fracture 4. Postsurgical changes from fusion of C5 through C7. Critical Value/emergent results were called by telephone at the time of interpretation on 11/06/2013 at 4:52 pm to Dr. Shirlyn Goltz , who verbally acknowledged these results.   Electronically Signed   By: Kerby Moors M.D.   On: 11/06/2013 16:54   Ct Abd Wo & W Cm  11/06/2013   ADDENDUM REPORT: 11/06/2013 14:31  ADDENDUM: Not mentioned in the impression section are similar subcentimeter low-density splenic lesions. These are indeterminate but may represent gamna gandy bodies, given cirrhosis and portal venous hypertension. Present back to 06/08/2012. Of doubtful clinical significance.   Electronically Signed   By: Abigail Miyamoto M.D.   On: 11/06/2013 14:31   11/06/2013   CLINICAL DATA:  Twenty month follow-up of right-sided renal oncocytoma. Intermittent right flank pain.  EXAM: CT ABDOMEN WITHOUT AND WITH CONTRAST  TECHNIQUE: Multidetector CT imaging of the abdomen was performed following the standard protocol before  and following the bolus administration of intravenous contrast.  CONTRAST:  49mL OMNIPAQUE IOHEXOL 300 MG/ML  SOLN  COMPARISON:  11/15/2012 CT.  Clinic note of 11/15/2012  FINDINGS: Lower Chest: Volume loss and scarring at the lung bases. Mild cardiomegaly with coronary artery atherosclerosis. No pericardial or pleural effusion.  Abdomen:  No renal calculi or hydronephrosis on precontrast images.  No hypervascular lesions within the liver, pancreas, kidneys on arterial phase images.  Portal venous phase images demonstrate moderate cirrhosis with old granulomatous disease in the liver. No suspicious focal hepatic lesion. Patent hepatic and portal veins.  Multiple tiny low-density splenic lesions, similar. Normal stomach, pancreas, adrenal glands. Cholelithiasis without acute cholecystitis or biliary ductal dilatation.  Normal adrenal glands. Moderate right renal cortical atrophy. Ablation defect is identified in the interpolar right kidney posteriorly. Slightly decreased surrounding perirenal interstitial thickening. No recurrent fluid collection or acute collecting system complication.  Too small to characterize interpolar right renal lesion. There is a 7 mm lower pole right renal hyper attenuating lesion on image 50 of series 2. This was present on image 45 of series 2 of the prior. This may be minimally enlarged  today. Similarly, an 8 mm focus of precontrast hyperattenuation in the interpolar left kidney anteriorly on image 43 of series 2 measured 5 mm on the prior.  An upper pole left renal cyst measures approximately 1.4 cm. Left renal too small to characterize lesions.  Aortic and branch vessel atherosclerosis. No retroperitoneal or retrocrural adenopathy. Normal abdominal bowel loops, without ascites  Bones/Musculoskeletal: Osteopenia. Convex right lumbar spine curvature. L1 compression deformity is severe and not significantly changed.  IMPRESSION: 1. Regression of ablation defect in the right kidney, without  evidence of locally recurrent disease. 2. Bilateral small precontrast hyperattenuating renal lesions which are too small to entirely characterize. These could represent hemorrhagic cysts or solid neoplasms. Likely enlarged since the prior, as detailed above. Recommend attention on follow-up. 3. Cirrhosis with probable portal venous hypertension. No evidence of hepatocellular carcinoma. 4. Cholelithiasis.  Electronically Signed: By: Abigail Miyamoto M.D. On: 11/06/2013 14:15   Dg Chest Portable 1 View  11/16/2013   CLINICAL DATA:  Short of breath and fatigue.  EXAM: PORTABLE CHEST - 1 VIEW  COMPARISON:  Chest radiograph 08/02/2012  FINDINGS: The heart size and mediastinal contours are within normal limits. Calcified atherosclerotic plaque noted within the aortic knob. Both lungs are clear. The visualized skeletal structures are unremarkable.  IMPRESSION: No active disease.   Electronically Signed   By: Kerby Moors M.D.   On: 11/16/2013 13:24   Dg Hand Complete Right  11/06/2013   CLINICAL DATA:  Fall  EXAM: RIGHT HAND - COMPLETE 3+ VIEW  COMPARISON:  None.  FINDINGS: Severe osteopenia. Plate and screws in the distal radius. No breakage or obvious loosening of the hardware. No obvious acute fractures. Central erosions in the PIP joints of the long finger and ring finger are present, with subluxation. There is also suspected erosions of the ulnar and radial styloids.  IMPRESSION: No acute bony pathology.  Chronic changes.   Electronically Signed   By: Maryclare Bean M.D.   On: 11/06/2013 16:57    Medications: Scheduled Meds: . acyclovir  200 mg Oral Daily  . cefTRIAXone (ROCEPHIN)  IV  1 g Intravenous Q24H  . [START ON 11/18/2013] Influenza vac split quadrivalent PF  0.5 mL Intramuscular Tomorrow-1000  . insulin aspart  0-15 Units Subcutaneous TID WC  . insulin aspart  0-5 Units Subcutaneous QHS  . [START ON 11/18/2013] metoprolol tartrate  12.5 mg Oral Once per day on Mon Wed Fri  . predniSONE  3 mg Oral Q  breakfast      LOS: 1 day   RAI,RIPUDEEP M.D. [facility] 11/17/2013, 11:45 AM Pager: [phone]  If 7PM-7AM, please contact night-coverage [email or web address] Password [email or web address]  **Disclaimer: This note was dictated with voice recognition software. Similar sounding words can inadvertently be transcribed and this note may contain transcription errors which may not have been corrected upon publication of note.**

## 2013-11-17 NOTE — Evaluation (Signed)
Physical Therapy Evaluation Patient Details Name: [name] MRN: [id number] DOB: Sep 18, 1924 Today's Date: 11/17/2013   History of Present Illness  HPI: [name] is a 80 y.o. female with a past medical history of diabetes, hypertension, recently discharged from the medicine service on 11/08/2013, admitted on 11/06/2013 after having a fall at home. Initial imaging studies revealed a 6 mm left frontal and temporal lobe subdural hematoma. Patient remained stable from a neurologic standpoint. she had a repeat head CT on 11/12/2013 which showed stable left frontal and left temporal subdural hematomas with no interim intracranial change. Over the past several days she has had a steep functional decline, having generalized weakness, poor tolerance to physical exertion, excessive daytime sleepiness, minimal by mouth intake and increased confusion. it appears she had an emergency room visit on 11/12/2013 presenting with complaints of dizziness. She does not have neurological deficits at that time with head CT not revealing acute abnormalities. She was discharged in stable condition at that time. Patient is presently confused, cannot provide reliable history, family members not present. History obtained from [facility] and emergency room staff. Urinalysis obtained in the emergency room showed urinary tract infection, she was administered ceftriaxone IV.    Clinical Impression   Pt admitted with above. Pt currently with functional limitations due to the deficits listed below (see PT Problem List).  Pt will benefit from skilled PT to increase their independence and safety with mobility to allow discharge to the venue listed below.       Follow Up Recommendations SNF    Equipment Recommendations  None recommended by PT (pt has all DME)    Recommendations for Other Services       Precautions / Restrictions Precautions Precautions: Fall      Mobility  Bed Mobility Overal bed mobility: Needs  Assistance Bed Mobility: Supine to Sit     Supine to sit: Min guard     General bed mobility comments: inefficient movement and need for bedrails to pull/push self to upright, and to pull self to EOB with reciprocal sooting  Transfers Overall transfer level: Needs assistance Equipment used: Rolling walker (2 wheeled) Transfers: Sit to/from Stand Sit to Stand: Min assist         General transfer comment: minsteady assist; Pt sat back down to bed without warning at initial stand; cues for safety, control and hand placement  Ambulation/Gait Ambulation/Gait assistance: Min guard Ambulation Distance (Feet): 5 Feet (pivot steps bed to chair) Assistive device: Rolling walker (2 wheeled)       General Gait Details: Limited amb distance as pt was overall fatigued, and would sit with head resting in hands with reported nausea  Stairs            Wheelchair Mobility    Modified Rankin (Stroke Patients Only)       Balance Overall balance assessment: History of Falls         Standing balance support: Bilateral upper extremity supported Standing balance-Leahy Scale: Poor                               Pertinent Vitals/Pain Pain Assessment: No/denies pain    Home Living Family/patient expects to be discharged to:: Private residence Living Arrangements: Other (Comment) (works days) Available Help at Discharge: Family Type of Home: Apartment Home Access: Stairs to enter Entrance Stairs-Rails: Right Entrance Stairs-Number of Steps: 4 Home Layout: One level Home Equipment: Environmental consultant - 2 wheels;Cane -  single point;Bedside commode;Shower seat      Prior Function Level of Independence: Independent with assistive device(s)               Hand Dominance        Extremity/Trunk Assessment   Upper Extremity Assessment: Generalized weakness           Lower Extremity Assessment: Generalized weakness         Communication   Communication: HOH  (per chart review)  Cognition Arousal/Alertness: Awake/alert Behavior During Therapy: WFL for tasks assessed/performed Overall Cognitive Status: Impaired/Different from baseline Area of Impairment: Orientation Orientation Level: Disoriented to;Time;Situation             General Comments: Unable to state her DOB when asked; at one point she said, "it's ahrd to think"    General Comments      Exercises        Assessment/Plan    PT Assessment Patient needs continued PT services  PT Diagnosis Generalized weakness   PT Problem List Decreased strength;Decreased activity tolerance;Decreased balance;Decreased mobility;Decreased coordination;Decreased cognition;Decreased knowledge of use of DME;Decreased safety awareness  PT Treatment Interventions DME instruction;Gait training;Stair training;Functional mobility training;Therapeutic activities;Therapeutic exercise;Balance training;Patient/family education   PT Goals (Current goals can be found in the Care Plan section) Acute Rehab PT Goals Patient Stated Goal: did not state PT Goal Formulation: With patient Time For Goal Achievement: 12/01/13 Potential to Achieve Goals: Good    Frequency Min 3X/week   Barriers to discharge Decreased caregiver support Son works days, and expressed concern that she is unsafe to stay at home alone    Co-evaluation               End of Session Equipment Utilized During Treatment: Gait belt Activity Tolerance: Patient limited by fatigue Patient left: in chair;with call bell/phone within reach;with family/visitor present Nurse Communication: Mobility status         Time: 0912-0927 PT Time Calculation (min): 15 min   Charges:   PT Evaluation $Initial PT Evaluation Tier I: 1 Procedure PT Treatments $Gait Training: 8-22 mins   PT G Codes:          Roney Marion Hamff 11/17/2013, 11:02 AM  Roney Marion, [place] Pager [phone] Office [phone]

## 2013-11-17 NOTE — ED Provider Notes (Signed)
Medical screening examination/treatment/procedure(s) were conducted as a shared visit with non-physician practitioner(s) and myself.  I personally evaluated the patient during the encounter.   EKG Interpretation   Date/Time:  Saturday November 16 2013 11:32:58 EDT Ventricular Rate:  88 PR Interval:  152 QRS Duration: 107 QT Interval:  378 QTC Calculation: 457 R Axis:   39 Text Interpretation:  Sinus rhythm Premature atrial complexes No  significant change since last tracing Confirmed by Noreen Mackintosh  MD, Lennette Bihari  ([id number]) on 11/16/2013 3:52:36 PM      Pt c/o ant/left cp, gen weakness, and ?recent positive ua without prior tx for uti.  Afeb. abd soft nt. Chest cta.     Mirna Mires, MD 11/17/13 [phone]

## 2013-11-18 DIAGNOSIS — I10 Essential (primary) hypertension: Secondary | ICD-10-CM

## 2013-11-18 DIAGNOSIS — I6200 Nontraumatic subdural hemorrhage, unspecified: Secondary | ICD-10-CM

## 2013-11-18 DIAGNOSIS — R404 Transient alteration of awareness: Secondary | ICD-10-CM

## 2013-11-18 DIAGNOSIS — N390 Urinary tract infection, site not specified: Secondary | ICD-10-CM | POA: Diagnosis not present

## 2013-11-18 LAB — GLUCOSE, CAPILLARY
GLUCOSE-CAPILLARY: 95 mg/dL (ref 70–99)
GLUCOSE-CAPILLARY: 160 mg/dL — AB (ref 70–99)
Glucose-Capillary: 143 mg/dL — ABNORMAL HIGH (ref 70–99)
Glucose-Capillary: 119 mg/dL — ABNORMAL HIGH (ref 70–99)

## 2013-11-18 MED ORDER — GLUCERNA SHAKE PO LIQD
237.0000 mL | Freq: Two times a day (BID) | ORAL | Status: DC
  Administered 2013-11-18 – 2013-11-19 (×2): 237 mL via ORAL

## 2013-11-18 MED ORDER — TOBRAMYCIN-DEXAMETHASONE 0.3-0.1 % OP OINT
TOPICAL_OINTMENT | Freq: Four times a day (QID) | OPHTHALMIC | Status: DC
  Administered 2013-11-18: 23:00:00 via OPHTHALMIC
  Administered 2013-11-18 – 2013-11-19 (×2): 1 via OPHTHALMIC
  Administered 2013-11-19: 06:00:00 via OPHTHALMIC
  Filled 2013-11-18 (×2): qty 3.5

## 2013-11-18 NOTE — Consult Note (Addendum)
[facility] Face-to-Face Psychiatry Consult   Reason for Consult:  Evaluate Capacity Referring Physician: Dr. Sheria Lang [name] is an 80 y.o. female. Total Time spent with patient: 30 minutes  Assessment: AXIS I:  t Delirium - improving, consider secondary to head trauma/subdural hematoma. Rule out underlying Dementia. AXIS II:  Deferred AXIS III:   Past Medical History  Diagnosis Date  . HTN (hypertension)   . Osteoporosis   . 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 COPD (chronic obstructive pulmonary disease)     no inhalers used   . PONV (postoperative nausea and vomiting)     in past, none recently  . History of TIA (transient ischemic attack) 2012--  NO RESIDUAL  . CHF (congestive heart failure)   . Arthritis     BACK , NECK, AND JOINTS  . Temporal arteritis   . Chronic steroid use   . 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 Macular degeneration of both eyes   . Diabetic peripheral neuropathy   . Insulin dependent diabetes mellitus   . Chronic cystitis   . Hemoperitoneum (nontraumatic)   . Urinary frequency   . Unspecified late effects of cerebrovascular disease   . Abnormality of gait   . Osteoporosis, unspecified   . Anxiety state, unspecified   . Herpes zoster with other nervous system complications(053.19)   . Anemia, unspecified   . Type I (juvenile type) diabetes mellitus without mention of complication, uncontrolled   . Other and unspecified hyperlipidemia   . Personality change due to conditions classified elsewhere   . Unspecified essential hypertension   . Dermatophytosis of nail   . Depressive disorder, not elsewhere classified   . Diaphragmatic hernia without mention of obstruction or gangrene   . Malignant neoplasm of renal pelvis    AXIS IV:  recent decrease in physical health AXIS V:  45-50  Plan:  No evidence of imminent risk to self or others at present.   Patient does not meet  criteria for psychiatric inpatient admission. See below  Subjective:   [name] is a 80 y.o. female patient admitted with functional decline, drowsiness, worsening confusion  HPI:  80 year old female. She is widowed and lives with an adult son. She had a recent fall, and sustained Subdural Hematoma ( left frontal and temporal lobes).  She developed worsening confusion, reported being increasingly drowsy, an increasingly weak. She was admitted on 9/12, and also found to have  A UTI. Report is that patient has made some progress, but continues to have symptoms, to include some confusion and poor PO intake. As discussed with Nursing Staff- son has considered option of SNF, and more recently, as she improves, taking her back home with home health care.  At this time patient is alert and attentive, not  Fluctuating in her level of alertness. She is partially confused- knows she is in the hospital, knows she is here because of " hitting my head  So  Hard it bled and I needed sutures", and knows day of week. She had difficulty with month/year. She denies prior psychiatric history and denies any depression or depressive symptoms at this time. She is aware that she is going to " be discharged soon, and is aware as well that there has been discussion about option of going to a SNF versus going home with son, home health aide". She stated " I definitely do not want to  go to a Nursing Home". She was able to  Express she knows there might be risk of falls or further deterioration at home, but states " there will be someone there to help me" HPI Elements:   Severe head trauma, likely contributing to encephalopathy, currently improving Past Psychiatric History: Past Medical History  Diagnosis Date  . HTN (hypertension)   . Osteoporosis   . 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 COPD (chronic obstructive pulmonary disease)     no inhalers used   . PONV (postoperative  nausea and vomiting)     in past, none recently  . History of TIA (transient ischemic attack) 2012--  NO RESIDUAL  . CHF (congestive heart failure)   . Arthritis     BACK , NECK, AND JOINTS  . Temporal arteritis   . Chronic steroid use   . 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 Macular degeneration of both eyes   . Diabetic peripheral neuropathy   . Insulin dependent diabetes mellitus   . Chronic cystitis   . Hemoperitoneum (nontraumatic)   . Urinary frequency   . Unspecified late effects of cerebrovascular disease   . Abnormality of gait   . Osteoporosis, unspecified   . Anxiety state, unspecified   . Herpes zoster with other nervous system complications(053.19)   . Anemia, unspecified   . Type I (juvenile type) diabetes mellitus without mention of complication, uncontrolled   . Other and unspecified hyperlipidemia   . Personality change due to conditions classified elsewhere   . Unspecified essential hypertension   . Dermatophytosis of nail   . Depressive disorder, not elsewhere classified   . Diaphragmatic hernia without mention of obstruction or gangrene   . Malignant neoplasm of renal pelvis     reports that she quit smoking about 30 years ago. Her smoking use included Cigarettes. She has a 60 pack-year smoking history. She has never used smokeless tobacco. She reports that she does not drink alcohol or use illicit drugs. Family History  Problem Relation Age of Onset  . Leukemia    . Cancer Mother   . Cancer Father   . Cancer Brother   . Cancer Daughter      Living Arrangements: Other (Comment) (works days)   Abuse/Neglect [facility]) Physical Abuse: Denies Verbal Abuse: Denies Sexual Abuse: Denies Allergies:   Allergies  Allergen Reactions  . Allegra [Fexofenadine Hcl]     Unknown reaction  . Levaquin [Levofloxacin In D5w] Other (See Comments)    Unknown reaction  . Tussionex Pennkinetic Er [Hydrocod Polst-Cpm Polst Er]  Nausea And Vomiting  . Codeine Nausea And Vomiting  . Morphine And Related Nausea And Vomiting   Objective: Blood pressure 118/60, pulse 72, temperature 97.8 F (36.6 C), temperature source Oral, resp. rate 16, height '5\' 4"'  (1.626 m), weight 70.1 kg (154 lb 8.7 oz), SpO2 97.00%.Body mass index is 26.51 kg/(m^2). Results for orders placed during the hospital encounter of 11/16/13 (from the past 72 hour(s))  CBC WITH DIFFERENTIAL     Status: Abnormal   Collection Time    11/16/13 12:06 PM      Result Value Ref Range   WBC 6.3  4.0 - 10.5 K/uL   RBC 3.93  3.87 - 5.11 MIL/uL   Hemoglobin 12.1  12.0 - 15.0 g/dL   HCT 36.0  36.0 - 46.0 %   MCV 91.6  78.0 - 100.0 fL   MCH 30.8  26.0 - 34.0 pg   MCHC 33.6  30.0 - 36.0 g/dL   RDW 15.6 (*) 11.5 - 15.5 %   Platelets 177  150 - 400 K/uL   Neutrophils Relative % 68  43 - 77 %   Neutro Abs 4.2  1.7 - 7.7 K/uL   Lymphocytes Relative 20  12 - 46 %   Lymphs Abs 1.3  0.7 - 4.0 K/uL   Monocytes Relative 11  3 - 12 %   Monocytes Absolute 0.7  0.1 - 1.0 K/uL   Eosinophils Relative 1  0 - 5 %   Eosinophils Absolute 0.1  0.0 - 0.7 K/uL   Basophils Relative 0  0 - 1 %   Basophils Absolute 0.0  0.0 - 0.1 K/uL  COMPREHENSIVE METABOLIC PANEL     Status: Abnormal   Collection Time    11/16/13 12:06 PM      Result Value Ref Range   Sodium 138  137 - 147 mEq/L   Potassium 4.3  3.7 - 5.3 mEq/L   Chloride 99  96 - 112 mEq/L   CO2 19  19 - 32 mEq/L   Glucose, Bld 118 (*) 70 - 99 mg/dL   BUN 21  6 - 23 mg/dL   Creatinine, Ser 0.79  0.50 - 1.10 mg/dL   Calcium 9.7  8.4 - 10.5 mg/dL   Total Protein 6.9  6.0 - 8.3 g/dL   Albumin 3.5  3.5 - 5.2 g/dL   AST 25  0 - 37 U/L   ALT 15  0 - 35 U/L   Alkaline Phosphatase 84  39 - 117 U/L   Total Bilirubin 1.1  0.3 - 1.2 mg/dL   GFR calc non Af Amer 73 (*) >90 mL/min   GFR calc Af Amer 85 (*) >90 mL/min   Comment: (NOTE)     The eGFR has been calculated using the CKD EPI equation.     This calculation has not  been validated in all clinical situations.     eGFR's persistently <90 mL/min signify possible Chronic Kidney     Disease.   Anion gap 20 (*) 5 - 15  PRO B NATRIURETIC PEPTIDE     Status: Abnormal   Collection Time    11/16/13 12:06 PM      Result Value Ref Range   Pro B Natriuretic peptide (BNP) 1039.0 (*) 0 - 450 pg/mL  I-STAT TROPOININ, ED     Status: None   Collection Time    11/16/13 12:11 PM      Result Value Ref Range   Troponin i, poc 0.01  0.00 - 0.08 ng/mL   Comment 3            Comment: Due to the release kinetics of cTnI,     a negative result within the first hours     of the onset of symptoms does not rule out     myocardial infarction with certainty.     If myocardial infarction is still suspected,     repeat the test at appropriate intervals.  I-STAT CG4 LACTIC ACID, ED     Status: None   Collection Time    11/16/13 12:22 PM      Result Value Ref Range   Lactic Acid, Venous 1.61  0.5 - 2.2 mmol/L  URINALYSIS, ROUTINE W REFLEX MICROSCOPIC     Status: Abnormal   Collection Time    11/16/13 12:56 PM      Result  Value Ref Range   Color, Urine AMBER (*) YELLOW   Comment: BIOCHEMICALS MAY BE AFFECTED BY COLOR   APPearance CLOUDY (*) CLEAR   Specific Gravity, Urine 1.022  1.005 - 1.030   pH 5.5  5.0 - 8.0   Glucose, UA NEGATIVE  NEGATIVE mg/dL   Hgb urine dipstick TRACE (*) NEGATIVE   Bilirubin Urine NEGATIVE  NEGATIVE   Ketones, ur >80 (*) NEGATIVE mg/dL   Protein, ur NEGATIVE  NEGATIVE mg/dL   Urobilinogen, UA 1.0  0.0 - 1.0 mg/dL   Nitrite POSITIVE (*) NEGATIVE   Leukocytes, UA SMALL (*) NEGATIVE  URINE MICROSCOPIC-ADD ON     Status: Abnormal   Collection Time    11/16/13 12:56 PM      Result Value Ref Range   Squamous Epithelial / LPF RARE  RARE   WBC, UA 11-20  <3 WBC/hpf   RBC / HPF 0-2  <3 RBC/hpf   Bacteria, UA MANY (*) RARE  URINE CULTURE     Status: None   Collection Time    11/16/13 12:56 PM      Result Value Ref Range   Specimen Description  URINE, CLEAN CATCH     Special Requests Normal     Culture  Setup Time       Value: 11/16/2013 17:07     Performed at [facility] Count       Value: >=100,000 COLONIES/ML     Performed at [facility]   Culture       Value: [place]     Performed at [facility]   Report Status PENDING    GLUCOSE, CAPILLARY     Status: Abnormal   Collection Time    11/16/13  5:38 PM      Result Value Ref Range   Glucose-Capillary 105 (*) 70 - 99 mg/dL  CULTURE, BLOOD (ROUTINE X 2)     Status: None   Collection Time    11/16/13  8:25 PM      Result Value Ref Range   Specimen Description BLOOD RIGHT ARM     Special Requests BOTTLES DRAWN AEROBIC AND ANAEROBIC 10CC EACH     Culture  Setup Time       Value: 11/17/2013 02:49     Performed at [facility]   Culture       Value:        BLOOD CULTURE RECEIVED NO GROWTH TO DATE CULTURE WILL BE HELD FOR 5 DAYS BEFORE ISSUING A FINAL NEGATIVE REPORT     Performed at [facility]   Report Status PENDING    CULTURE, BLOOD (ROUTINE X 2)     Status: None   Collection Time    11/16/13  8:40 PM      Result Value Ref Range   Specimen Description BLOOD LEFT ARM     Special Requests BOTTLES DRAWN AEROBIC AND ANAEROBIC 10CC EACH     Culture  Setup Time       Value: 11/17/2013 02:49     Performed at [facility]   Culture       Value:        BLOOD CULTURE RECEIVED NO GROWTH TO DATE CULTURE WILL BE HELD FOR 5 DAYS BEFORE ISSUING A FINAL NEGATIVE REPORT     Performed at [facility]   Report Status PENDING    GLUCOSE, CAPILLARY     Status: Abnormal   Collection Time  11/16/13  9:36 PM      Result Value Ref Range   Glucose-Capillary 104 (*) 70 - 99 mg/dL   Comment 1 Notify RN    GLUCOSE, CAPILLARY     Status: Abnormal   Collection Time    11/17/13  8:03 AM      Result Value Ref Range   Glucose-Capillary 122 (*) 70 - 99 mg/dL  BASIC METABOLIC PANEL     Status: Abnormal    Collection Time    11/17/13 11:34 AM      Result Value Ref Range   Sodium 140  137 - 147 mEq/L   Potassium 4.3  3.7 - 5.3 mEq/L   Chloride 100  96 - 112 mEq/L   CO2 20  19 - 32 mEq/L   Glucose, Bld 120 (*) 70 - 99 mg/dL   BUN 24 (*) 6 - 23 mg/dL   Creatinine, Ser 0.82  0.50 - 1.10 mg/dL   Calcium 9.4  8.4 - 10.5 mg/dL   GFR calc non Af Amer 63 (*) >90 mL/min   GFR calc Af Amer 73 (*) >90 mL/min   Comment: (NOTE)     The eGFR has been calculated using the CKD EPI equation.     This calculation has not been validated in all clinical situations.     eGFR's persistently <90 mL/min signify possible Chronic Kidney     Disease.   Anion gap 20 (*) 5 - 15  CBC     Status: Abnormal   Collection Time    11/17/13 11:34 AM      Result Value Ref Range   WBC 6.6  4.0 - 10.5 K/uL   RBC 4.04  3.87 - 5.11 MIL/uL   Hemoglobin 12.3  12.0 - 15.0 g/dL   HCT 37.0  36.0 - 46.0 %   MCV 91.6  78.0 - 100.0 fL   MCH 30.4  26.0 - 34.0 pg   MCHC 33.2  30.0 - 36.0 g/dL   RDW 15.8 (*) 11.5 - 15.5 %   Platelets 198  150 - 400 K/uL  GLUCOSE, CAPILLARY     Status: Abnormal   Collection Time    11/17/13 11:52 AM      Result Value Ref Range   Glucose-Capillary 112 (*) 70 - 99 mg/dL  GLUCOSE, CAPILLARY     Status: Abnormal   Collection Time    11/17/13  5:42 PM      Result Value Ref Range   Glucose-Capillary 110 (*) 70 - 99 mg/dL  GLUCOSE, CAPILLARY     Status: None   Collection Time    11/17/13  9:58 PM      Result Value Ref Range   Glucose-Capillary 91  70 - 99 mg/dL   Comment 1 Notify RN     Comment 2 Documented in Chart    GLUCOSE, CAPILLARY     Status: None   Collection Time    11/18/13  7:45 AM      Result Value Ref Range   Glucose-Capillary 95  70 - 99 mg/dL  GLUCOSE, CAPILLARY     Status: Abnormal   Collection Time    11/18/13 12:09 PM      Result Value Ref Range   Glucose-Capillary 143 (*) 70 - 99 mg/dL  GLUCOSE, CAPILLARY     Status: Abnormal   Collection Time    11/18/13  5:35 PM       Result Value Ref Range   Glucose-Capillary 160 (*) 70 -  99 mg/dL   Labs are reviewed and are pertinent for elevated BNP, elevated serum glucose  Current Facility-Administered Medications  Medication Dose Route Frequency Provider Last Rate Last Dose  . acetaminophen (TYLENOL) tablet 650 mg  650 mg Oral Q6H PRN Kelvin Cellar, MD   650 mg at 11/17/13 1723   Or  . acetaminophen (TYLENOL) suppository 650 mg  650 mg Rectal Q6H PRN Kelvin Cellar, MD      . acyclovir (ZOVIRAX) 200 MG capsule 200 mg  200 mg Oral Daily Kelvin Cellar, MD   200 mg at 11/18/13 0859  . alum & mag hydroxide-simeth (MAALOX/MYLANTA) 200-200-20 MG/5ML suspension 30 mL  30 mL Oral Q6H PRN Kelvin Cellar, MD      . cefTRIAXone (ROCEPHIN) 1 g in dextrose 5 % 50 mL IVPB  1 g Intravenous Q24H Kelvin Cellar, MD   1 g at 11/18/13 1339  . feeding supplement (GLUCERNA SHAKE) (GLUCERNA SHAKE) liquid 237 mL  237 mL Oral BID BM Heather Cornelison Pitts, RD   237 mL at 11/18/13 1400  . insulin aspart (novoLOG) injection 0-15 Units  0-15 Units Subcutaneous TID WC Kelvin Cellar, MD   3 Units at 11/18/13 1815  . insulin aspart (novoLOG) injection 0-5 Units  0-5 Units Subcutaneous QHS Kelvin Cellar, MD      . metoprolol tartrate (LOPRESSOR) tablet 12.5 mg  12.5 mg Oral Once per day on Mon Wed Fri Kelvin Cellar, MD   12.5 mg at 11/18/13 0859  . ondansetron (ZOFRAN) tablet 4 mg  4 mg Oral Q6H PRN Kelvin Cellar, MD   4 mg at 11/17/13 4536   Or  . ondansetron (ZOFRAN) injection 4 mg  4 mg Intravenous Q6H PRN Kelvin Cellar, MD      . oxybutynin (DITROPAN) tablet 5 mg  5 mg Oral TID PRN Kelvin Cellar, MD      . predniSONE (DELTASONE) tablet 3 mg  3 mg Oral Q breakfast Kelvin Cellar, MD   3 mg at 11/18/13 4680  . tobramycin-dexamethasone (TOBRADEX) ophthalmic ointment   Both Eyes 4 times per day Ripudeep Krystal Eaton, MD        Psychiatric Specialty Exam:     Blood pressure 118/60, pulse 72, temperature 97.8 F (36.6 C),  temperature source Oral, resp. rate 16, height '5\' 4"'  (1.626 m), weight 70.1 kg (154 lb 8.7 oz), SpO2 97.00%.Body mass index is 26.51 kg/(m^2).  General Appearance: Fairly Groomed  Engineer, water::  Good  Speech:  Slow- patient is hard of hearing and often requests that questions be repeated   Volume:  Normal  Mood:  denies depression, describes mood as " normal"  Affect:  Appropriate  Thought Process:  Linear and slightly slowed  Orientation:  Other:  partially oriented, no fluctuation of sensorium at this time  Thought Content:  denies hallucinations, no delusions  Suicidal Thoughts:  No  Homicidal Thoughts:  No  Memory:  recall 3/3 immediate, and 0/3 at 5 minutes  Judgement:  Fair  Insight:  Fair  Psychomotor Activity:  Normal  Concentration:  Fair  Recall:  [employer] of Knowledge:Fair  Language: Fair  Akathisia:  Negative  Handed:  Right  AIMS (if indicated):     Assets:  Desire for Improvement Housing Resilience Social Support  Sleep:      Musculoskeletal: Strength & Muscle Tone: in bed, not examined- patient states she ambulates with walker [place]: as above Patient leans: N/A  Treatment Plan Summary: 1. Based on patient's current level  of alertness, ability to describe condition that has led to admission, ability to verbalize proposed disposition plans, and ability to appreciate there may be increased risk  In going home versus SNF, at this time I do think that patient has capacity to participate in making decisions about proposed disposition. 2. As per patient , adult son is very invested in her well being, and is going to arrange for home health aide availability at home so that she is not alone.Recommend that treatment team continue to describe  risk versus benefits of SNF versus home discharge  with patient and her  son , and that patient's understanding that she will have home health aides around the clock  once she goes home is confirmed with son prior to  discharge.   Neita Garnet 11/18/2013 6:31 PM

## 2013-11-18 NOTE — Progress Notes (Signed)
Note/chart reviewed. Agree with note.   Deolinda Frid RD, LDN, CNSC [phone] Pager [phone] After Hours Pager   

## 2013-11-18 NOTE — Progress Notes (Signed)
INITIAL NUTRITION ASSESSMENT  DOCUMENTATION CODES Per approved criteria  -Not Applicable   INTERVENTION:  Provide Glucerna Shake po BID, each supplement provides 220 kcal and 10 grams of protein  Encourage PO intake  Will continue to monitor  NUTRITION DIAGNOSIS: Inadequate oral intake related to decreased appetite as evidenced by poor PO intake <25% x 5 days.   Goal: Pt to meet >/= 90% of their estimated nutrition needs   Monitor:  PO and supplemental intake, weight, labs, I/O's  Reason for Assessment: Consult for poor po intake   Admitting Dx: Acute encephalopathy  ASSESSMENT: 80 y.o. female with a past medical history of diabetes, hypertension, recently discharged from the medicine service on 11/08/2013, admitted on 11/06/2013 after having a fall at home. Over the past several days she has had a steep functional decline, having generalized weakness, poor tolerance to physical exertion, excessive daytime sleepiness, minimal by mouth intake and increased confusion.Patient is presently confused, cannot provide reliable history, family members not present.   Per documentation, weight has been stable the past year. Per son, pt has not eaten since she was [facility] from [facility] on [facility]. (0% PO x 5 days). Today, pt is eating better.  Pt is confused and not a good historian, no family present in room.  Nutrition focused physical exam shows no sign of depletion of muscle mass or body fat.  Labs reviewed:  High BUN Glucose: 120  Height: Ht Readings from Last 1 Encounters:  11/17/13 5\' 4"  (1.626 m)    Weight: Wt Readings from Last 1 Encounters:  11/17/13 154 lb 8.7 oz (70.1 kg)    Ideal Body Weight: 120 lb  % Ideal Body Weight: 128%  Wt Readings from Last 10 Encounters:  11/17/13 154 lb 8.7 oz (70.1 kg)  11/07/13 156 lb 12 oz (71.1 kg)  09/04/13 153 lb (69.4 kg)  06/28/13 156 lb (70.761 kg)  05/07/13 157 lb 9.6 oz (71.487 kg)  01/15/13 151 lb (68.493 kg)   10/24/12 157 lb (71.215 kg)  08/06/12 175 lb 7.8 oz (79.6 kg)  08/06/12 175 lb 7.8 oz (79.6 kg)  07/31/12 157 lb 8 oz (71.442 kg)    Usual Body Weight: unable to obtain from pt  % Usual Body Weight: NA  BMI:  Body mass index is 26.51 kg/(m^2).  Estimated Nutritional Needs: Kcal: 1500-1700 Protein: 85-95g Fluid: 1.7L/day  Skin: head laceration, punctures  Diet Order: Carb Control  EDUCATION NEEDS: -Education not appropriate at this time   Intake/Output Summary (Last 24 hours) at 11/18/13 1207 Last data filed at 11/17/13 1826  Gross per 24 hour  Intake    350 ml  Output    225 ml  Net    125 ml    Last BM: 9/11  Labs:   Recent Labs Lab 11/12/13 2127 11/16/13 1206 11/17/13 1134  NA 140 138 140  K 4.7 4.3 4.3  CL 102 99 100  CO2 25 19 20   BUN 43* 21 24*  CREATININE 1.28* 0.79 0.82  CALCIUM 9.7 9.7 9.4  GLUCOSE 190* 118* 120*    CBG (last 3)   Recent Labs  11/17/13 1742 11/17/13 2158 11/18/13 0745  GLUCAP 110* 91 95    Scheduled Meds: . acyclovir  200 mg Oral Daily  . cefTRIAXone (ROCEPHIN)  IV  1 g Intravenous Q24H  . insulin aspart  0-15 Units Subcutaneous TID WC  . insulin aspart  0-5 Units Subcutaneous QHS  . metoprolol tartrate  12.5 mg Oral Once per  day on Mon Wed Fri  . predniSONE  3 mg Oral Q breakfast  . tobramycin-dexamethasone   Both Eyes 4 times per day    Continuous Infusions:   Past Medical History  Diagnosis Date  . HTN (hypertension)   . Osteoporosis   . 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 COPD (chronic obstructive pulmonary disease)     no inhalers used   . PONV (postoperative nausea and vomiting)     in past, none recently  . History of TIA (transient ischemic attack) 2012--  NO RESIDUAL  . CHF (congestive heart failure)   . Arthritis     BACK , NECK, AND JOINTS  . Temporal arteritis   . Chronic steroid use   . 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 Macular degeneration of both eyes   . Diabetic peripheral neuropathy   . Insulin dependent diabetes mellitus   . Chronic cystitis   . Hemoperitoneum (nontraumatic)   . Urinary frequency   . Unspecified late effects of cerebrovascular disease   . Abnormality of gait   . Osteoporosis, unspecified   . Anxiety state, unspecified   . Herpes zoster with other nervous system complications(053.19)   . Anemia, unspecified   . Type I (juvenile type) diabetes mellitus without mention of complication, uncontrolled   . Other and unspecified hyperlipidemia   . Personality change due to conditions classified elsewhere   . Unspecified essential hypertension   . Dermatophytosis of nail   . Depressive disorder, not elsewhere classified   . Diaphragmatic hernia without mention of obstruction or gangrene   . Malignant neoplasm of renal pelvis     Past Surgical History  Procedure Laterality Date  . Lumbar laminectomy  11-19-2003    L3 -- L4  . Total hip arthroplasty Left 01-22-2005  . Total knee arthroplasty Bilateral LEFT 08-08-2007;  RIGHT 05-08-2009  . Anterior cervical decomp/discectomy fusion  10-12-2005    C5 -- C7  . Posterior fusion cervical spine  02-10-2006    C5 -- C7  . Orif w/ plate right distal radius fx  06-18-2003  . Cataract extraction w/ intraocular lens  implant, bilateral Right Oct 1992    [place]  . Cryoablation right renal mass  03-16-2012    ONCOCYTOMA (POST OP 06-09-2012 PERCUTANEOUS DRAIN PLACEMENT FOR PERINEPHRIC ABSCESS)  . Temporal artery biopsy / ligation    . Elbow surgery Bilateral 2003  (APPROX)  . Carpal tunnel release Right 2009  (APPROX)  . Cystoscopy w/ ureteral stent placement Right 07/10/2012    Procedure: CYSTOSCOPY WITH RIGHT RETROGRADE PYELOGRAM/URETERAL STENT PLACEMENT;  Surgeon: Malka So, MD;  Location: [facility];  Service: Urology;  Laterality: Right;  . Hip arthroplasty Right 08/03/2012    Procedure: CEMENTED  MONOPOLAR HIP;  Surgeon: Marybelle Killings, MD;  Location: [facility];  Service: Orthopedics;  Laterality: Right;  . Cataract extraction w/ intraocular lens implant Left [place], [place], [place] Licensed Dietitian Nutritionist Pager: [phone]

## 2013-11-18 NOTE — Progress Notes (Signed)
Patient ID: [name]  female  WUJ:[id number]    DOB: April 14, 1924    DOA: 11/16/2013  PCP: Estill Dooms, MD  Assessment/Plan: Principal Problem:   Acute encephalopathy: Improving likely due to UTI, failure to thrive, worsening dementia, chronic subdural hematomas - Patient confusion is improving, she has had Falls, poor by mouth intake, failure to thrive  - PT evaluation recommended skilled nursing facility, patient not interested however her son it is difficult to take care of her, she misses her medications, due to falls, has chronic subdural hematoma, difficult for him to take care of her, he is interested in skilled care facility - I have placed psychiatry consultation to evaluate capacity for making medical decisions.  Active Problems:   Type II or unspecified type diabetes mellitus with renal manifestations, uncontrolled(250.42) - Continue sliding scale insulin    HTN (hypertension) - Currently stable  Gram-negative rods Urinary tract infection - Urine culture sensitivities pending, continue IV Rocephin    Chronic kidney disease, stage III (moderate) - Currently stable, continue to monitor counts    FTT (failure to thrive) in adult, With moderate malnutrition - Obtain nutrition consult - PT recommended skilled nursing facility, placed social worker consult   falls with chronic subdural hematomas -CT head obtained Showed unchanged size of left frontotemporal subdural hematoma with edema in the inferior left frontal lobe again noted, chronic small vessel white matter changes with mild atrophy   DVT Prophylaxis: SCD's  Code Status: FC  Family Communication: discussed with patient's son yesterday   Disposition: Likely skilled nursing facility  Consultants:  None   Procedures:  None   Antibiotics:  IV Rocephin     Subjective: Feels a lot better today, eating better, no fevers or chills, nausea vomiting  Objective: Weight change:   Intake/Output  Summary (Last 24 hours) at 11/18/13 1132 Last data filed at 11/17/13 1826  Gross per 24 hour  Intake    350 ml  Output    225 ml  Net    125 ml   Blood pressure 132/77, pulse 88, temperature 97.8 F (36.6 C), temperature source Oral, resp. rate 15, height 5\' 4"  (1.626 m), weight 70.1 kg (154 lb 8.7 oz), SpO2 97.00%.  Physical Exam: General: A x O x2, still some confusion, NAD CVS: S1-S2 clear, no murmur rubs or gallops Chest: Clear to auscultation bilaterally abdomen: soft nontender, nondistended, normal bowel sounds  Extremities: no cyanosis, clubbing or edema noted bilaterally   Lab Results: Basic Metabolic Panel:  Recent Labs Lab 11/16/13 1206 11/17/13 1134  NA 138 140  K 4.3 4.3  CL 99 100  CO2 19 20  GLUCOSE 118* 120*  BUN 21 24*  CREATININE 0.79 0.82  CALCIUM 9.7 9.4   Liver Function Tests:  Recent Labs Lab 11/16/13 1206  AST 25  ALT 15  ALKPHOS 84  BILITOT 1.1  PROT 6.9  ALBUMIN 3.5   No results found for this basename: LIPASE, AMYLASE,  in the last 168 hours No results found for this basename: AMMONIA,  in the last 168 hours CBC:  Recent Labs Lab 11/16/13 1206 11/17/13 1134  WBC 6.3 6.6  NEUTROABS 4.2  --   HGB 12.1 12.3  HCT 36.0 37.0  MCV 91.6 91.6  PLT 177 198   Cardiac Enzymes: No results found for this basename: CKTOTAL, CKMB, CKMBINDEX, TROPONINI,  in the last 168 hours BNP: No components found with this basename: POCBNP,  CBG:  Recent Labs Lab 11/17/13 0803 11/17/13  1152 11/17/13 1742 11/17/13 2158 11/18/13 0745  GLUCAP 122* 112* 110* 91 95     Micro Results: Recent Results (from the past 240 hour(s))  URINE CULTURE     Status: None   Collection Time    11/16/13 12:56 PM      Result Value Ref Range Status   Specimen Description URINE, CLEAN CATCH   Final   Special Requests Normal   Final   Culture  Setup Time     Final   Value: 11/16/2013 17:07     Performed at [facility]     Final    Value: >=100,000 COLONIES/ML     Performed at [facility]   Culture     Final   Value: [place]     Performed at [facility]   Report Status PENDING   Incomplete  CULTURE, BLOOD (ROUTINE X 2)     Status: None   Collection Time    11/16/13  8:25 PM      Result Value Ref Range Status   Specimen Description BLOOD RIGHT ARM   Final   Special Requests BOTTLES DRAWN AEROBIC AND ANAEROBIC 10CC EACH   Final   Culture  Setup Time     Final   Value: 11/17/2013 02:49     Performed at [facility]   Culture     Final   Value:        BLOOD CULTURE RECEIVED NO GROWTH TO DATE CULTURE WILL BE HELD FOR 5 DAYS BEFORE ISSUING A FINAL NEGATIVE REPORT     Performed at [facility]   Report Status PENDING   Incomplete  CULTURE, BLOOD (ROUTINE X 2)     Status: None   Collection Time    11/16/13  8:40 PM      Result Value Ref Range Status   Specimen Description BLOOD LEFT ARM   Final   Special Requests BOTTLES DRAWN AEROBIC AND ANAEROBIC 10CC EACH   Final   Culture  Setup Time     Final   Value: 11/17/2013 02:49     Performed at [facility]   Culture     Final   Value:        BLOOD CULTURE RECEIVED NO GROWTH TO DATE CULTURE WILL BE HELD FOR 5 DAYS BEFORE ISSUING A FINAL NEGATIVE REPORT     Performed at [facility]   Report Status PENDING   Incomplete    Studies/Results: Ct Head Wo Contrast  11/16/2013   CLINICAL DATA:  80 year old female with weakness and fatigue. Recent left frontotemporal subdural hematoma.  EXAM: CT HEAD WITHOUT CONTRAST  TECHNIQUE: Contiguous axial images were obtained from the base of the skull through the vertex without intravenous contrast.  COMPARISON:  11/13/1998 and and and prior head CTs dating back to 06/21/2006  FINDINGS: A left frontotemporal subdural hematoma is unchanged in size but decreased in attenuation measuring up to 5 mm in the frontal region. Edema within the inferior left frontal lobe again noted.   Mild atrophy and chronic small-vessel white matter ischemic changes are again noted.  There is no evidence of new hemorrhage, acute infarction, hydrocephalus, new extra-axial collection or midline shift.  No acute bony abnormalities are present. Opacification of the visualized left maxillary sinus again noted.  IMPRESSION: Unchanged size of left frontotemporal subdural hematoma with edema in the inferior left frontal lobe again noted.  No new or acute finding identified.  Chronic small-vessel white matter ischemic changes and mild atrophy.   Electronically Signed   By: Hassan Rowan M.D.   On: 11/16/2013 14:23   Ct Head Wo Contrast  11/12/2013   CLINICAL DATA:  Dizziness.  EXAM: CT HEAD WITHOUT CONTRAST  TECHNIQUE: Contiguous axial images were obtained from the base of the skull through the vertex without intravenous contrast.  COMPARISON:  11/06/2013.  FINDINGS: Left frontal and left temporal subdural hematomas again noted. No interim change. No mass. No hydrocephalus. No hemorrhage. White matter changes noted consistent chronic ischemia. Orbits are unremarkable. No acute bony abnormality. Opacification of left mastoid sinus. Fracture of the left inferior orbital rim/ anterior left maxillary sinus and lateral wall of the left maxillary sinus noted.  IMPRESSION: 1. Stable left frontal and left temporal subdural hematomas again noted. No interim intracranial change. No acute abnormality new abnormality. 2. Opacification left maxillary sinus with left anterior orbital rim /anterior maxillary sinus and lateral wall left maxillary sinus fractures.   Electronically Signed   By: Marcello Moores  Register   On: 11/12/2013 21:47   Ct Head Wo Contrast  11/06/2013   CLINICAL DATA:  Dizziness and posterior head laceration/hematoma following fall  EXAM: CT HEAD WITHOUT CONTRAST  CT CERVICAL SPINE WITHOUT CONTRAST  TECHNIQUE: Multidetector CT imaging of the head and cervical spine was performed following the standard protocol without  intravenous contrast. Multiplanar CT image reconstructions of the cervical spine were also generated.  COMPARISON:  05/17/2012  FINDINGS: CT HEAD FINDINGS  There is prominence of the sulci and ventricles compatible with brain atrophy. Diffuse low attenuation throughout the subcortical and periventricular white matter is noted compatible with brain atrophy. There is a left frontal and temporal subdural hematoma which measures 6 mm and appears acute. No significant mass effect upon the adjacent brain parenchyma. There is opacification of the left maxillary sinus. Remaining paranasal sinuses are clear. The calvarium appears intact.  CT CERVICAL SPINE FINDINGS  Straightening of normal cervical lordosis. The bones appear osteopenic. The patient is status post fusion of C5-C7. There is degenerative disc disease at the C7-T1 level. Cerclage wire fixation of the C5 through C7 spinous processes noted. The prevertebral soft tissue space appears normal. There is no fracture.  IMPRESSION: 1. Acute left frontal and temporal subdural hematoma. 2. Small vessel ischemic disease and brain atrophy. 3. No evidence for cervical spine fracture 4. Postsurgical changes from fusion of C5 through C7. Critical Value/emergent results were called by telephone at the time of interpretation on 11/06/2013 at 4:52 pm to Dr. Shirlyn Goltz , who verbally acknowledged these results.   Electronically Signed   By: Kerby Moors M.D.   On: 11/06/2013 16:54   Ct Cervical Spine Wo Contrast  11/06/2013   CLINICAL DATA:  Dizziness and posterior head laceration/hematoma following fall  EXAM: CT HEAD WITHOUT CONTRAST  CT CERVICAL SPINE WITHOUT CONTRAST  TECHNIQUE: Multidetector CT imaging of the head and cervical spine was performed following the standard protocol without intravenous contrast. Multiplanar CT image reconstructions of the cervical spine were also generated.  COMPARISON:  05/17/2012  FINDINGS: CT HEAD FINDINGS  There is prominence of the sulci and  ventricles compatible with brain atrophy. Diffuse low attenuation throughout the subcortical and periventricular white matter is noted compatible with brain atrophy. There is a left frontal and temporal subdural hematoma which measures 6 mm and appears acute. No significant mass effect upon the adjacent brain parenchyma. There is opacification of the left maxillary sinus. Remaining paranasal sinuses are clear.  The calvarium appears intact.  CT CERVICAL SPINE FINDINGS  Straightening of normal cervical lordosis. The bones appear osteopenic. The patient is status post fusion of C5-C7. There is degenerative disc disease at the C7-T1 level. Cerclage wire fixation of the C5 through C7 spinous processes noted. The prevertebral soft tissue space appears normal. There is no fracture.  IMPRESSION: 1. Acute left frontal and temporal subdural hematoma. 2. Small vessel ischemic disease and brain atrophy. 3. No evidence for cervical spine fracture 4. Postsurgical changes from fusion of C5 through C7. Critical Value/emergent results were called by telephone at the time of interpretation on 11/06/2013 at 4:52 pm to Dr. Shirlyn Goltz , who verbally acknowledged these results.   Electronically Signed   By: Kerby Moors M.D.   On: 11/06/2013 16:54   Ct Abd Wo & W Cm  11/06/2013   ADDENDUM REPORT: 11/06/2013 14:31  ADDENDUM: Not mentioned in the impression section are similar subcentimeter low-density splenic lesions. These are indeterminate but may represent gamna gandy bodies, given cirrhosis and portal venous hypertension. Present back to 06/08/2012. Of doubtful clinical significance.   Electronically Signed   By: Abigail Miyamoto M.D.   On: 11/06/2013 14:31   11/06/2013   CLINICAL DATA:  Twenty month follow-up of right-sided renal oncocytoma. Intermittent right flank pain.  EXAM: CT ABDOMEN WITHOUT AND WITH CONTRAST  TECHNIQUE: Multidetector CT imaging of the abdomen was performed following the standard protocol before and following the  bolus administration of intravenous contrast.  CONTRAST:  93mL OMNIPAQUE IOHEXOL 300 MG/ML  SOLN  COMPARISON:  11/15/2012 CT.  Clinic note of 11/15/2012  FINDINGS: Lower Chest: Volume loss and scarring at the lung bases. Mild cardiomegaly with coronary artery atherosclerosis. No pericardial or pleural effusion.  Abdomen:  No renal calculi or hydronephrosis on precontrast images.  No hypervascular lesions within the liver, pancreas, kidneys on arterial phase images.  Portal venous phase images demonstrate moderate cirrhosis with old granulomatous disease in the liver. No suspicious focal hepatic lesion. Patent hepatic and portal veins.  Multiple tiny low-density splenic lesions, similar. Normal stomach, pancreas, adrenal glands. Cholelithiasis without acute cholecystitis or biliary ductal dilatation.  Normal adrenal glands. Moderate right renal cortical atrophy. Ablation defect is identified in the interpolar right kidney posteriorly. Slightly decreased surrounding perirenal interstitial thickening. No recurrent fluid collection or acute collecting system complication.  Too small to characterize interpolar right renal lesion. There is a 7 mm lower pole right renal hyper attenuating lesion on image 50 of series 2. This was present on image 45 of series 2 of the prior. This may be minimally enlarged today. Similarly, an 8 mm focus of precontrast hyperattenuation in the interpolar left kidney anteriorly on image 43 of series 2 measured 5 mm on the prior.  An upper pole left renal cyst measures approximately 1.4 cm. Left renal too small to characterize lesions.  Aortic and branch vessel atherosclerosis. No retroperitoneal or retrocrural adenopathy. Normal abdominal bowel loops, without ascites  Bones/Musculoskeletal: Osteopenia. Convex right lumbar spine curvature. L1 compression deformity is severe and not significantly changed.  IMPRESSION: 1. Regression of ablation defect in the right kidney, without evidence of  locally recurrent disease. 2. Bilateral small precontrast hyperattenuating renal lesions which are too small to entirely characterize. These could represent hemorrhagic cysts or solid neoplasms. Likely enlarged since the prior, as detailed above. Recommend attention on follow-up. 3. Cirrhosis with probable portal venous hypertension. No evidence of hepatocellular carcinoma. 4. Cholelithiasis.  Electronically Signed: By: Abigail Miyamoto M.D. On: 11/06/2013  14:15   Dg Chest Portable 1 View  11/16/2013   CLINICAL DATA:  Short of breath and fatigue.  EXAM: PORTABLE CHEST - 1 VIEW  COMPARISON:  Chest radiograph 08/02/2012  FINDINGS: The heart size and mediastinal contours are within normal limits. Calcified atherosclerotic plaque noted within the aortic knob. Both lungs are clear. The visualized skeletal structures are unremarkable.  IMPRESSION: No active disease.   Electronically Signed   By: Kerby Moors M.D.   On: 11/16/2013 13:24   Dg Hand Complete Right  11/06/2013   CLINICAL DATA:  Fall  EXAM: RIGHT HAND - COMPLETE 3+ VIEW  COMPARISON:  None.  FINDINGS: Severe osteopenia. Plate and screws in the distal radius. No breakage or obvious loosening of the hardware. No obvious acute fractures. Central erosions in the PIP joints of the long finger and ring finger are present, with subluxation. There is also suspected erosions of the ulnar and radial styloids.  IMPRESSION: No acute bony pathology.  Chronic changes.   Electronically Signed   By: Maryclare Bean M.D.   On: 11/06/2013 16:57    Medications: Scheduled Meds: . acyclovir  200 mg Oral Daily  . cefTRIAXone (ROCEPHIN)  IV  1 g Intravenous Q24H  . insulin aspart  0-15 Units Subcutaneous TID WC  . insulin aspart  0-5 Units Subcutaneous QHS  . metoprolol tartrate  12.5 mg Oral Once per day on Mon Wed Fri  . predniSONE  3 mg Oral Q breakfast      LOS: 2 days   Serrina Minogue M.D. [facility] 11/18/2013, 11:32 AM Pager: [phone]  If 7PM-7AM, please  contact night-coverage [email or web address] Password [email or web address]  **Disclaimer: This note was dictated with voice recognition software. Similar sounding words can inadvertently be transcribed and this note may contain transcription errors which may not have been corrected upon publication of note.**

## 2013-11-19 DIAGNOSIS — N183 Chronic kidney disease, stage 3 unspecified: Secondary | ICD-10-CM

## 2013-11-19 DIAGNOSIS — I6990 Unspecified sequelae of unspecified cerebrovascular disease: Secondary | ICD-10-CM

## 2013-11-19 DIAGNOSIS — G609 Hereditary and idiopathic neuropathy, unspecified: Secondary | ICD-10-CM

## 2013-11-19 LAB — BASIC METABOLIC PANEL
Anion gap: 16 — ABNORMAL HIGH (ref 5–15)
BUN: 25 mg/dL — ABNORMAL HIGH (ref 6–23)
CO2: 25 meq/L (ref 19–32)
Calcium: 9.7 mg/dL (ref 8.4–10.5)
Chloride: 100 mEq/L (ref 96–112)
Creatinine, Ser: 0.77 mg/dL (ref 0.50–1.10)
GFR calc Af Amer: 86 mL/min — ABNORMAL LOW (ref >90)
GFR calc non Af Amer: 74 mL/min — ABNORMAL LOW (ref >90)
Glucose, Bld: 121 mg/dL — ABNORMAL HIGH (ref 70–99)
POTASSIUM: 4 meq/L (ref 3.7–5.3)
SODIUM: 141 meq/L (ref 137–147)

## 2013-11-19 LAB — URINE CULTURE
Colony Count: >=100000
Special Requests: NORMAL

## 2013-11-19 LAB — CBC
HEMATOCRIT: 37.1 % (ref 36.0–46.0)
HEMOGLOBIN: 12.4 g/dL (ref 12.0–15.0)
MCH: 30.7 pg (ref 26.0–34.0)
MCHC: 33.4 g/dL (ref 30.0–36.0)
MCV: 91.8 fL (ref 78.0–100.0)
Platelets: 186 10*3/uL (ref 150–400)
RBC: 4.04 MIL/uL (ref 3.87–5.11)
RDW: 15.7 % — ABNORMAL HIGH (ref 11.5–15.5)
WBC: 7.5 10*3/uL (ref 4.0–10.5)

## 2013-11-19 LAB — GLUCOSE, CAPILLARY
GLUCOSE-CAPILLARY: 123 mg/dL — AB (ref 70–99)
Glucose-Capillary: 175 mg/dL — ABNORMAL HIGH (ref 70–99)

## 2013-11-19 MED ORDER — TOBRAMYCIN-DEXAMETHASONE 0.3-0.1 % OP OINT: TOPICAL_OINTMENT | Freq: Four times a day (QID) | OPHTHALMIC | Status: DC

## 2013-11-19 MED ORDER — OXYBUTYNIN CHLORIDE 5 MG PO TABS: 5.0000 mg | ORAL_TABLET | Freq: Three times a day (TID) | ORAL | Status: DC | PRN

## 2013-11-19 MED ORDER — GLUCERNA SHAKE PO LIQD: 237.0000 mL | Freq: Two times a day (BID) | ORAL | Status: DC

## 2013-11-19 MED ORDER — NITROFURANTOIN MONOHYD MACRO 100 MG PO CAPS: 100.0000 mg | ORAL_CAPSULE | Freq: Two times a day (BID) | ORAL | Status: DC

## 2013-11-19 MED ORDER — METOPROLOL TARTRATE 25 MG PO TABS
12.5000 mg | ORAL_TABLET | ORAL | Status: DC
Start: 2013-11-19 — End: 2014-01-02

## 2013-11-19 NOTE — Care Management Note (Addendum)
  Page 2 of 2   11/19/2013     11:00:17 AM CARE MANAGEMENT NOTE 11/19/2013  Patient:  [name], [name]   Account Number:  [id number]  Date Initiated:  11/19/2013  Documentation initiated by:  Magdalen Spatz  Subjective/Objective Assessment:     Action/Plan:   Anticipated DC Date:  11/19/2013   Anticipated DC Plan:  [place]         Choice offered to / List presented to:  C-4 Adult Children        HH arranged  HH-1 RN  [place]      [place] agency     Status of service:   Medicare Important Message given?  YES (If response is "NO", the following Medicare IM given date fields will be blank) Date Medicare IM given:  11/19/2013 Medicare IM given by:  Magdalen Spatz Date Additional Medicare IM given:   Additional Medicare IM given by:    Discharge Disposition:    Per UR Regulation:    If discussed at Long Length of Stay Meetings, dates discussed:    Comments: 11-19-13 Patient has been discharged . Referral given to [facility] . Magdalen Spatz RN BSN   11-19-13 Son and DR Rai spoke , now son is reconsidering SNF . Left message for SW Doris. Did not call [facility] . Magdalen Spatz RN BSN [phone]      11-19-13 Spoke to son [phone] at bedside . Gave IM and explained son voiced understanding.  Son workes daily 0700 to 1200 .  Son states they have no friends / family etc who can stay with her . Explained she needs supervision . Provided son with Private Duty Agency list . Son refusing SNF , states patient still has bills from last SNF stay .  Son states patient already has walkers , canes and bedside commode at home .  Son requesting to speak to MD , concerned patient is not ready for discharge .  Paged MD . MD attempted to reach son this am .  Son picked [place] RN BSN

## 2013-11-19 NOTE — Progress Notes (Signed)
Patient ID: [name]  female  BMW:[id number]    DOB: [birth year]    DOA: 11/16/2013  PCP: Estill Dooms, MD  Assessment/Plan: Principal Problem:   Acute encephalopathy: Improving likely due to UTI, failure to thrive, worsening dementia, chronic subdural hematomas - Patient confusion is improving, she has had Falls, poor by mouth intake, failure to thrive  - PT evaluation recommended skilled nursing facility, patient not interested however my initial discussion with the son, it is difficult to take care of her, she misses her medications, due to falls, has chronic subdural hematoma, difficult for him to take care of her, - patient was seen by psychiatry and per their evaluation, does not have capacity to make medical decisions.   Active Problems:   Type II or unspecified type diabetes mellitus with renal manifestations, uncontrolled(250.42) - Continue sliding scale insulin    HTN (hypertension) - Currently stable  Escherichia coli Urinary tract infection - continue IV Rocephin    Chronic kidney disease, stage III (moderate) - Currently stable, continue to monitor counts    FTT (failure to thrive) in adult, With moderate malnutrition - Obtained nutrition consult continue nutritional supplements   falls with chronic subdural hematomas -CT head obtained Showed unchanged size of left frontotemporal subdural hematoma with edema in the inferior left frontal lobe again noted, chronic small vessel white matter changes with mild atrophy   DVT Prophylaxis: SCD's  Code Status: FC  Family Communication: discussed with patient's granddaughter and son. Patient's son wants to take her home however he does not have 24/7 supervision as he works during the day. I offered him to discuss with social worker regarding skilled nursing facility. He also wants to discuss with his family. Patient will not be safe alone with her dementia, confusion, UTI and recurrent falls. I have also discussed with  Education officer, museum.  Disposition: Likely skilled nursing facility vs 24/7 care at home  Consultants:  Psychiatry  Procedures:  None   Antibiotics:  IV Rocephin     Subjective: Feels a lot better, wants to go home no complaints  Objective: Weight change:   Intake/Output Summary (Last 24 hours) at 11/19/13 1125 Last data filed at 11/19/13 0652  Gross per 24 hour  Intake    420 ml  Output      0 ml  Net    420 ml   Blood pressure 125/63, pulse 73, temperature 98.1 F (36.7 C), temperature source Oral, resp. rate 15, height 5\' 4"  (1.626 m), weight 70.1 kg (154 lb 8.7 oz), SpO2 97.00%.  Physical Exam: General: A x O x2, still some confusion, but improving  CVS: S1-S2 clear, no murmur rubs or gallops Chest: Clear to auscultation bilaterally abdomen: soft nontender, nondistended, normal bowel sounds  Extremities: no cyanosis, clubbing or edema noted bilaterally   Lab Results: Basic Metabolic Panel:  Recent Labs Lab 11/17/13 1134 11/19/13 0421  NA 140 141  K 4.3 4.0  CL 100 100  CO2 20 25  GLUCOSE 120* 121*  BUN 24* 25*  CREATININE 0.82 0.77  CALCIUM 9.4 9.7   Liver Function Tests:  Recent Labs Lab 11/16/13 1206  AST 25  ALT 15  ALKPHOS 84  BILITOT 1.1  PROT 6.9  ALBUMIN 3.5   No results found for this basename: LIPASE, AMYLASE,  in the last 168 hours No results found for this basename: AMMONIA,  in the last 168 hours CBC:  Recent Labs Lab 11/16/13 1206 11/17/13 1134 11/19/13 0421  WBC 6.3  6.6 7.5  NEUTROABS 4.2  --   --   HGB 12.1 12.3 12.4  HCT 36.0 37.0 37.1  MCV 91.6 91.6 91.8  PLT 177 198 186   Cardiac Enzymes: No results found for this basename: CKTOTAL, CKMB, CKMBINDEX, TROPONINI,  in the last 168 hours BNP: No components found with this basename: POCBNP,  CBG:  Recent Labs Lab 11/18/13 0745 11/18/13 1209 11/18/13 1735 11/18/13 2237 11/19/13 0756  GLUCAP 95 143* 160* 119* 123*     Micro Results: Recent Results (from the  past 240 hour(s))  URINE CULTURE     Status: None   Collection Time    11/16/13 12:56 PM      Result Value Ref Range Status   Specimen Description URINE, CLEAN CATCH   Final   Special Requests Normal   Final   Culture  Setup Time     Final   Value: 11/16/2013 17:07     Performed at [facility]     Final   Value: >=100,000 COLONIES/ML     Performed at [facility]   Culture     Final   Value: ESCHERICHIA COLI     VIRIDANS STREPTOCOCCUS     Performed at [facility]   Report Status 11/19/2013 FINAL   Final   Organism ID, Bacteria ESCHERICHIA COLI   Final  CULTURE, BLOOD (ROUTINE X 2)     Status: None   Collection Time    11/16/13  8:25 PM      Result Value Ref Range Status   Specimen Description BLOOD RIGHT ARM   Final   Special Requests BOTTLES DRAWN AEROBIC AND ANAEROBIC 10CC EACH   Final   Culture  Setup Time     Final   Value: 11/17/2013 02:49     Performed at [facility]   Culture     Final   Value:        BLOOD CULTURE RECEIVED NO GROWTH TO DATE CULTURE WILL BE HELD FOR 5 DAYS BEFORE ISSUING A FINAL NEGATIVE REPORT     Performed at [facility]   Report Status PENDING   Incomplete  CULTURE, BLOOD (ROUTINE X 2)     Status: None   Collection Time    11/16/13  8:40 PM      Result Value Ref Range Status   Specimen Description BLOOD LEFT ARM   Final   Special Requests BOTTLES DRAWN AEROBIC AND ANAEROBIC 10CC EACH   Final   Culture  Setup Time     Final   Value: 11/17/2013 02:49     Performed at [facility]   Culture     Final   Value:        BLOOD CULTURE RECEIVED NO GROWTH TO DATE CULTURE WILL BE HELD FOR 5 DAYS BEFORE ISSUING A FINAL NEGATIVE REPORT     Performed at [facility]   Report Status PENDING   Incomplete    Studies/Results: Ct Head Wo Contrast  11/16/2013   CLINICAL DATA:  80 year old female with weakness and fatigue. Recent left frontotemporal subdural hematoma.  EXAM: CT HEAD  WITHOUT CONTRAST  TECHNIQUE: Contiguous axial images were obtained from the base of the skull through the vertex without intravenous contrast.  COMPARISON:  11/13/1998 and and and prior head CTs dating back to 06/21/2006  FINDINGS: A left frontotemporal subdural hematoma is unchanged in size but decreased in attenuation measuring up to 5 mm in the  frontal region. Edema within the inferior left frontal lobe again noted.  Mild atrophy and chronic small-vessel white matter ischemic changes are again noted.  There is no evidence of new hemorrhage, acute infarction, hydrocephalus, new extra-axial collection or midline shift.  No acute bony abnormalities are present. Opacification of the visualized left maxillary sinus again noted.  IMPRESSION: Unchanged size of left frontotemporal subdural hematoma with edema in the inferior left frontal lobe again noted.  No new or acute finding identified.  Chronic small-vessel white matter ischemic changes and mild atrophy.   Electronically Signed   By: Hassan Rowan M.D.   On: 11/16/2013 14:23   Ct Head Wo Contrast  11/12/2013   CLINICAL DATA:  Dizziness.  EXAM: CT HEAD WITHOUT CONTRAST  TECHNIQUE: Contiguous axial images were obtained from the base of the skull through the vertex without intravenous contrast.  COMPARISON:  11/06/2013.  FINDINGS: Left frontal and left temporal subdural hematomas again noted. No interim change. No mass. No hydrocephalus. No hemorrhage. White matter changes noted consistent chronic ischemia. Orbits are unremarkable. No acute bony abnormality. Opacification of left mastoid sinus. Fracture of the left inferior orbital rim/ anterior left maxillary sinus and lateral wall of the left maxillary sinus noted.  IMPRESSION: 1. Stable left frontal and left temporal subdural hematomas again noted. No interim intracranial change. No acute abnormality new abnormality. 2. Opacification left maxillary sinus with left anterior orbital rim /anterior maxillary sinus and  lateral wall left maxillary sinus fractures.   Electronically Signed   By: Marcello Moores  Register   On: 11/12/2013 21:47   Ct Head Wo Contrast  11/06/2013   CLINICAL DATA:  Dizziness and posterior head laceration/hematoma following fall  EXAM: CT HEAD WITHOUT CONTRAST  CT CERVICAL SPINE WITHOUT CONTRAST  TECHNIQUE: Multidetector CT imaging of the head and cervical spine was performed following the standard protocol without intravenous contrast. Multiplanar CT image reconstructions of the cervical spine were also generated.  COMPARISON:  05/17/2012  FINDINGS: CT HEAD FINDINGS  There is prominence of the sulci and ventricles compatible with brain atrophy. Diffuse low attenuation throughout the subcortical and periventricular white matter is noted compatible with brain atrophy. There is a left frontal and temporal subdural hematoma which measures 6 mm and appears acute. No significant mass effect upon the adjacent brain parenchyma. There is opacification of the left maxillary sinus. Remaining paranasal sinuses are clear. The calvarium appears intact.  CT CERVICAL SPINE FINDINGS  Straightening of normal cervical lordosis. The bones appear osteopenic. The patient is status post fusion of C5-C7. There is degenerative disc disease at the C7-T1 level. Cerclage wire fixation of the C5 through C7 spinous processes noted. The prevertebral soft tissue space appears normal. There is no fracture.  IMPRESSION: 1. Acute left frontal and temporal subdural hematoma. 2. Small vessel ischemic disease and brain atrophy. 3. No evidence for cervical spine fracture 4. Postsurgical changes from fusion of C5 through C7. Critical Value/emergent results were called by telephone at the time of interpretation on 11/06/2013 at 4:52 pm to Dr. Shirlyn Goltz , who verbally acknowledged these results.   Electronically Signed   By: Kerby Moors M.D.   On: 11/06/2013 16:54   Ct Cervical Spine Wo Contrast  11/06/2013   CLINICAL DATA:  Dizziness and posterior  head laceration/hematoma following fall  EXAM: CT HEAD WITHOUT CONTRAST  CT CERVICAL SPINE WITHOUT CONTRAST  TECHNIQUE: Multidetector CT imaging of the head and cervical spine was performed following the standard protocol without intravenous contrast. Multiplanar CT image  reconstructions of the cervical spine were also generated.  COMPARISON:  05/17/2012  FINDINGS: CT HEAD FINDINGS  There is prominence of the sulci and ventricles compatible with brain atrophy. Diffuse low attenuation throughout the subcortical and periventricular white matter is noted compatible with brain atrophy. There is a left frontal and temporal subdural hematoma which measures 6 mm and appears acute. No significant mass effect upon the adjacent brain parenchyma. There is opacification of the left maxillary sinus. Remaining paranasal sinuses are clear. The calvarium appears intact.  CT CERVICAL SPINE FINDINGS  Straightening of normal cervical lordosis. The bones appear osteopenic. The patient is status post fusion of C5-C7. There is degenerative disc disease at the C7-T1 level. Cerclage wire fixation of the C5 through C7 spinous processes noted. The prevertebral soft tissue space appears normal. There is no fracture.  IMPRESSION: 1. Acute left frontal and temporal subdural hematoma. 2. Small vessel ischemic disease and brain atrophy. 3. No evidence for cervical spine fracture 4. Postsurgical changes from fusion of C5 through C7. Critical Value/emergent results were called by telephone at the time of interpretation on 11/06/2013 at 4:52 pm to Dr. Shirlyn Goltz , who verbally acknowledged these results.   Electronically Signed   By: Kerby Moors M.D.   On: 11/06/2013 16:54   Ct Abd Wo & W Cm  11/06/2013   ADDENDUM REPORT: 11/06/2013 14:31  ADDENDUM: Not mentioned in the impression section are similar subcentimeter low-density splenic lesions. These are indeterminate but may represent gamna gandy bodies, given cirrhosis and portal venous  hypertension. Present back to 06/08/2012. Of doubtful clinical significance.   Electronically Signed   By: Abigail Miyamoto M.D.   On: 11/06/2013 14:31   11/06/2013   CLINICAL DATA:  Twenty month follow-up of right-sided renal oncocytoma. Intermittent right flank pain.  EXAM: CT ABDOMEN WITHOUT AND WITH CONTRAST  TECHNIQUE: Multidetector CT imaging of the abdomen was performed following the standard protocol before and following the bolus administration of intravenous contrast.  CONTRAST:  69mL OMNIPAQUE IOHEXOL 300 MG/ML  SOLN  COMPARISON:  11/15/2012 CT.  Clinic note of 11/15/2012  FINDINGS: Lower Chest: Volume loss and scarring at the lung bases. Mild cardiomegaly with coronary artery atherosclerosis. No pericardial or pleural effusion.  Abdomen:  No renal calculi or hydronephrosis on precontrast images.  No hypervascular lesions within the liver, pancreas, kidneys on arterial phase images.  Portal venous phase images demonstrate moderate cirrhosis with old granulomatous disease in the liver. No suspicious focal hepatic lesion. Patent hepatic and portal veins.  Multiple tiny low-density splenic lesions, similar. Normal stomach, pancreas, adrenal glands. Cholelithiasis without acute cholecystitis or biliary ductal dilatation.  Normal adrenal glands. Moderate right renal cortical atrophy. Ablation defect is identified in the interpolar right kidney posteriorly. Slightly decreased surrounding perirenal interstitial thickening. No recurrent fluid collection or acute collecting system complication.  Too small to characterize interpolar right renal lesion. There is a 7 mm lower pole right renal hyper attenuating lesion on image 50 of series 2. This was present on image 45 of series 2 of the prior. This may be minimally enlarged today. Similarly, an 8 mm focus of precontrast hyperattenuation in the interpolar left kidney anteriorly on image 43 of series 2 measured 5 mm on the prior.  An upper pole left renal cyst measures  approximately 1.4 cm. Left renal too small to characterize lesions.  Aortic and branch vessel atherosclerosis. No retroperitoneal or retrocrural adenopathy. Normal abdominal bowel loops, without ascites  Bones/Musculoskeletal: Osteopenia. Convex right lumbar spine curvature.  L1 compression deformity is severe and not significantly changed.  IMPRESSION: 1. Regression of ablation defect in the right kidney, without evidence of locally recurrent disease. 2. Bilateral small precontrast hyperattenuating renal lesions which are too small to entirely characterize. These could represent hemorrhagic cysts or solid neoplasms. Likely enlarged since the prior, as detailed above. Recommend attention on follow-up. 3. Cirrhosis with probable portal venous hypertension. No evidence of hepatocellular carcinoma. 4. Cholelithiasis.  Electronically Signed: By: Abigail Miyamoto M.D. On: 11/06/2013 14:15   Dg Chest Portable 1 View  11/16/2013   CLINICAL DATA:  Short of breath and fatigue.  EXAM: PORTABLE CHEST - 1 VIEW  COMPARISON:  Chest radiograph 08/02/2012  FINDINGS: The heart size and mediastinal contours are within normal limits. Calcified atherosclerotic plaque noted within the aortic knob. Both lungs are clear. The visualized skeletal structures are unremarkable.  IMPRESSION: No active disease.   Electronically Signed   By: Kerby Moors M.D.   On: 11/16/2013 13:24   Dg Hand Complete Right  11/06/2013   CLINICAL DATA:  Fall  EXAM: RIGHT HAND - COMPLETE 3+ VIEW  COMPARISON:  None.  FINDINGS: Severe osteopenia. Plate and screws in the distal radius. No breakage or obvious loosening of the hardware. No obvious acute fractures. Central erosions in the PIP joints of the long finger and ring finger are present, with subluxation. There is also suspected erosions of the ulnar and radial styloids.  IMPRESSION: No acute bony pathology.  Chronic changes.   Electronically Signed   By: Maryclare Bean M.D.   On: 11/06/2013 16:57     Medications: Scheduled Meds: . acyclovir  200 mg Oral Daily  . cefTRIAXone (ROCEPHIN)  IV  1 g Intravenous Q24H  . feeding supplement (GLUCERNA SHAKE)  237 mL Oral BID BM  . insulin aspart  0-15 Units Subcutaneous TID WC  . insulin aspart  0-5 Units Subcutaneous QHS  . metoprolol tartrate  12.5 mg Oral Once per day on Mon Wed Fri  . predniSONE  3 mg Oral Q breakfast  . tobramycin-dexamethasone   Both Eyes 4 times per day      LOS: 3 days   RAI,RIPUDEEP M.D. [facility] 11/19/2013, 11:25 AM Pager: [phone]  If 7PM-7AM, please contact night-coverage [email or web address] Password [email or web address]  **Disclaimer: This note was dictated with voice recognition software. Similar sounding words can inadvertently be transcribed and this note may contain transcription errors which may not have been corrected upon publication of note.**

## 2013-11-19 NOTE — Discharge Summary (Addendum)
Physician Discharge Summary  Patient ID: [name] MRN: [id number] DOB/AGE: 1924/03/18 80 y.o.  Admit date: 11/16/2013 Discharge date: 11/19/2013  Primary Care Physician:  Estill Dooms, MD  Discharge Diagnoses:    . Escherichia coli UTI (lower urinary tract infection) . Acute encephalopathy . HTN (hypertension) . Recurrent UTI . FTT (failure to thrive) in adult . Type II or unspecified type diabetes mellitus with renal manifestations, uncontrolled(250.42) . Chronic kidney disease, stage III (moderate) Chronic subdural hematoma  Consults: Psychiatry   Recommendations for Outpatient Follow-up:  Patient was recommended skilled nursing facility however patient's son refusing. Patient does not have capacity to make decisions per psychiatry evaluation.  Allergies:   Allergies  Allergen Reactions  . Allegra [Fexofenadine Hcl]     Unknown reaction  . Levaquin [Levofloxacin In D5w] Other (See Comments)    Unknown reaction  . Tussionex Pennkinetic Er [Hydrocod Polst-Cpm Polst Er] Nausea And Vomiting  . Codeine Nausea And Vomiting  . Morphine And Related Nausea And Vomiting     Discharge Medications:   Medication List    STOP taking these medications       amitriptyline 50 MG tablet  Commonly known as:  ELAVIL     gabapentin 300 MG capsule  Commonly known as:  NEURONTIN     LEVEMIR FLEXTOUCH 100 UNIT/ML Pen  Generic drug:  Insulin Detemir     meloxicam 15 MG tablet  Commonly known as:  MOBIC      TAKE these medications       acyclovir 200 MG capsule  Commonly known as:  ZOVIRAX  Take 200 mg by mouth daily. To prevent relapse of herpes simplex.     aspirin EC 325 MG tablet  Take 325 mg by mouth daily.     Cranberry 475 MG Caps  Take 1-2 capsules by mouth 2 (two) times daily. Take 1 capsule every morning and 2 capsules every night.     feeding supplement (GLUCERNA SHAKE) Liqd  Take 237 mLs by mouth 2 (two) times daily between meals.     metoprolol  tartrate 25 MG tablet  Commonly known as:  LOPRESSOR  Take 0.5 tablets (12.5 mg total) by mouth 3 (three) times a week. On Monday, Wednesday and Friday     multivitamin with minerals Tabs tablet  Take 1 tablet by mouth daily.     nitrofurantoin (macrocrystal-monohydrate) 100 MG capsule  Commonly known as:  MACROBID  Take 1 capsule (100 mg total) by mouth 2 (two) times daily. X 4 more days     NOVOLOG FLEXPEN 100 UNIT/ML FlexPen  Generic drug:  insulin aspart  Inject 0-15 Units into the skin 3 (three) times daily with meals. If blood sugar less than 150 0 units, 151-200 3 units, 201-250 5 units, 251-300 7 units, 301-350 9 units, 351-400 11 units, over 400 15 units and call MD     oxybutynin 5 MG tablet  Commonly known as:  DITROPAN  Take 1 tablet (5 mg total) by mouth 3 (three) times daily as needed for bladder spasms (Up to 3 times daily to control bladder spasms.).     predniSONE 1 MG tablet  Commonly known as:  DELTASONE  Take 3 mg by mouth daily with breakfast.     tobramycin-dexamethasone ophthalmic ointment  Commonly known as:  TOBRADEX  Place into both eyes every 6 (six) hours. X 5 days     traMADol 50 MG tablet  Commonly known as:  ULTRAM  Take 50 mg by  mouth every 8 (eight) hours as needed (pain.).         Brief H and P: For complete details please refer to admission H and P, but in brief [name] is a 80 y.o. female with a past medical history of diabetes, hypertension, recently discharged from the medicine service on 11/08/2013, admitted on 11/06/2013 after having a fall at home. Initial imaging studies revealed a 6 mm left frontal and temporal lobe subdural hematoma. Patient remained stable from a neurologic standpoint. she had a repeat head CT on 11/12/2013 which showed stable left frontal and left temporal subdural hematomas with no interim intracranial change. Over the past several days she has had a steep functional decline, having generalized weakness, poor  tolerance to physical exertion, excessive daytime sleepiness, minimal by mouth intake and increased confusion. It appeared she had an emergency room visit on 11/12/2013 presenting with complaints of dizziness. She does not have neurological deficits at that time with head CT not revealing acute abnormalities. She was discharged in stable condition at that time. At the time of the admission, patient was confused, cannot provide reliable history, family members not present. History obtained from [facility] and emergency room staff. Urinalysis obtained in the emergency room showed urinary tract infection, she was administered ceftriaxone    Hospital Course:  Acute encephalopathy: Improving likely due to UTI, failure to thrive, worsening dementia, chronic subdural hematomas, patient confusion is improving, she has had Falls, poor by mouth intake, failure to thrive  - PT evaluation recommended skilled nursing facility, patient is not interested, per my initial discussion with the son, it is difficult to take care of her, she misses her medications, due to falls, has chronic subdural hematoma, difficult for him to take care of her. Psychiatry was consulted and patient does not have capacity for making medical decisions. Patient was recommended skilled nursing facility however patient's son requested to DC home per his mother's wishes and they will arrange 24/7 supervision.  Type II or unspecified type diabetes mellitus with renal manifestations, uncontrolled(250.42) : Do to poor by mouth intake, long-acting Levemir has been discontinued, continue sliding scale insulin.  HTN (hypertension) - Currently stable   Escherichia coli Urinary tract infection  Urine culture and sensitivities showed Escherichia coli, patient was placed on IV Rocephin during admission, per sensitivities, discharge on nitrofurantoin for another 4 days to complete a course for 7 days (recurrent UTIs, mental status changes hence longer course than  3 days).   Chronic kidney disease, stage III (moderate) - Currently stable  FTT (failure to thrive) in adult, With moderate malnutrition nutrition consult was obtained and patient was started on nutritional supplements:   Falls with chronic subdural hematomas  -CT head obtained Showed unchanged size of left frontotemporal subdural hematoma with edema in the inferior left frontal lobe again noted, chronic small vessel white matter changes with mild atrophy    Day of Discharge BP 125/63  Pulse 73  Temp(Src) 98.1 F (36.7 C) (Oral)  Resp 15  Ht 5\' 4"  (1.626 m)  Wt 70.1 kg (154 lb 8.7 oz)  BMI 26.51 kg/m2  SpO2 97%  Physical Exam: General: Alert and awake oriented x self, NAD  CVS: S1-S2 clear no murmur rubs or gallops Chest: clear to auscultation bilaterally, no wheezing rales or rhonchi Abdomen: soft nontender, nondistended, normal bowel sounds Extremities: no cyanosis, clubbing or edema noted bilaterally    The results of significant diagnostics from this hospitalization (including imaging, microbiology, ancillary and laboratory) are listed below  for reference.    LAB RESULTS: Basic Metabolic Panel:  Recent Labs Lab 11/17/13 1134 11/19/13 0421  NA 140 141  K 4.3 4.0  CL 100 100  CO2 20 25  GLUCOSE 120* 121*  BUN 24* 25*  CREATININE 0.82 0.77  CALCIUM 9.4 9.7   Liver Function Tests:  Recent Labs Lab 11/16/13 1206  AST 25  ALT 15  ALKPHOS 84  BILITOT 1.1  PROT 6.9  ALBUMIN 3.5   No results found for this basename: LIPASE, AMYLASE,  in the last 168 hours No results found for this basename: AMMONIA,  in the last 168 hours CBC:  Recent Labs Lab 11/16/13 1206 11/17/13 1134 11/19/13 0421  WBC 6.3 6.6 7.5  NEUTROABS 4.2  --   --   HGB 12.1 12.3 12.4  HCT 36.0 37.0 37.1  MCV 91.6 91.6 91.8  PLT 177 198 186   Cardiac Enzymes: No results found for this basename: CKTOTAL, CKMB, CKMBINDEX, TROPONINI,  in the last 168 hours BNP: No components found  with this basename: POCBNP,  CBG:  Recent Labs Lab 11/18/13 2237 11/19/13 0756  GLUCAP 119* 123*    Significant Diagnostic Studies:  Ct Head Wo Contrast  11/16/2013   CLINICAL DATA:  79 year old female with weakness and fatigue. Recent left frontotemporal subdural hematoma.  EXAM: CT HEAD WITHOUT CONTRAST  TECHNIQUE: Contiguous axial images were obtained from the base of the skull through the vertex without intravenous contrast.  COMPARISON:  11/13/1998 and and and prior head CTs dating back to 06/21/2006  FINDINGS: A left frontotemporal subdural hematoma is unchanged in size but decreased in attenuation measuring up to 5 mm in the frontal region. Edema within the inferior left frontal lobe again noted.  Mild atrophy and chronic small-vessel white matter ischemic changes are again noted.  There is no evidence of new hemorrhage, acute infarction, hydrocephalus, new extra-axial collection or midline shift.  No acute bony abnormalities are present. Opacification of the visualized left maxillary sinus again noted.  IMPRESSION: Unchanged size of left frontotemporal subdural hematoma with edema in the inferior left frontal lobe again noted.  No new or acute finding identified.  Chronic small-vessel white matter ischemic changes and mild atrophy.   Electronically Signed   By: Hassan Rowan M.D.   On: 11/16/2013 14:23   Dg Chest Portable 1 View  11/16/2013   CLINICAL DATA:  Short of breath and fatigue.  EXAM: PORTABLE CHEST - 1 VIEW  COMPARISON:  Chest radiograph 08/02/2012  FINDINGS: The heart size and mediastinal contours are within normal limits. Calcified atherosclerotic plaque noted within the aortic knob. Both lungs are clear. The visualized skeletal structures are unremarkable.  IMPRESSION: No active disease.   Electronically Signed   By: Kerby Moors M.D.   On: 11/16/2013 13:24       Disposition and Follow-up: Discharge Instructions   Diet Carb Modified    Complete by:  As directed       Increase activity slowly    Complete by:  As directed             DISPOSITION: Home with PT, 24/ 7 care  DIET: Carb modified diet   DISCHARGE FOLLOW-UP Follow-up Information   Follow up with GREEN, Viviann Spare, MD. Schedule an appointment as soon as possible for a visit in 2 weeks. (for hospital follow-up)    Specialty:  Internal Medicine   Contact information:   [place]  [phone]  Time spent on Discharge: 35 mins  Signed:   Ellary Casamento M.D. [facility] 11/19/2013, 2:03 PM  Pager: [phone]      **Disclaimer: This note was dictated with voice recognition software. Similar sounding words can inadvertently be transcribed and this note may contain transcription errors which may not have been corrected upon publication of note.**

## 2013-11-19 NOTE — Progress Notes (Signed)
D/c to home with son . D/c instructions given and son demonstrated understanding. Breathing regular and unlabored on room air. VSS.

## 2013-11-20 ENCOUNTER — Encounter: Payer: Self-pay | Admitting: Internal Medicine

## 2013-11-20 ENCOUNTER — Other Ambulatory Visit: Payer: Self-pay | Admitting: Internal Medicine

## 2013-11-20 ENCOUNTER — Ambulatory Visit (INDEPENDENT_AMBULATORY_CARE_PROVIDER_SITE_OTHER): Payer: Commercial Managed Care - HMO | Admitting: Internal Medicine

## 2013-11-20 VITALS — BP 126/72 | HR 88 | Temp 98.3°F | Resp 10 | Wt 147.0 lb

## 2013-11-20 DIAGNOSIS — M353 Polymyalgia rheumatica: Secondary | ICD-10-CM

## 2013-11-20 DIAGNOSIS — I6200 Nontraumatic subdural hemorrhage, unspecified: Secondary | ICD-10-CM

## 2013-11-20 DIAGNOSIS — I10 Essential (primary) hypertension: Secondary | ICD-10-CM

## 2013-11-20 DIAGNOSIS — M6281 Muscle weakness (generalized): Secondary | ICD-10-CM

## 2013-11-20 DIAGNOSIS — R269 Unspecified abnormalities of gait and mobility: Secondary | ICD-10-CM

## 2013-11-20 DIAGNOSIS — N390 Urinary tract infection, site not specified: Secondary | ICD-10-CM

## 2013-11-20 DIAGNOSIS — S0100XA Unspecified open wound of scalp, initial encounter: Secondary | ICD-10-CM

## 2013-11-20 DIAGNOSIS — R32 Unspecified urinary incontinence: Secondary | ICD-10-CM

## 2013-11-20 DIAGNOSIS — S065XAA Traumatic subdural hemorrhage with loss of consciousness status unknown, initial encounter: Secondary | ICD-10-CM

## 2013-11-20 DIAGNOSIS — E1129 Type 2 diabetes mellitus with other diabetic kidney complication: Secondary | ICD-10-CM

## 2013-11-20 DIAGNOSIS — E119 Type 2 diabetes mellitus without complications: Secondary | ICD-10-CM

## 2013-11-20 DIAGNOSIS — E1165 Type 2 diabetes mellitus with hyperglycemia: Principal | ICD-10-CM

## 2013-11-20 DIAGNOSIS — S065X9A Traumatic subdural hemorrhage with loss of consciousness of unspecified duration, initial encounter: Secondary | ICD-10-CM

## 2013-11-20 MED ORDER — SITAGLIPTIN PHOSPHATE 100 MG PO TABS: 100.0000 mg | ORAL_TABLET | Freq: Every day | ORAL | Status: DC

## 2013-11-20 NOTE — Patient Instructions (Signed)
Only take medications in the medication list.   Stop taking all form of insulin from today  Stop taking gabapentin and amitriptyline

## 2013-11-20 NOTE — Progress Notes (Signed)
Patient ID: [name], female   DOB: 01-21-1925, 80 y.o.   MRN: [id number]    Chief Complaint  Patient presents with  . Hospitalization Follow-up    Seen in ER on 11/06/13 for fall, patient had staples placed in head.   Marland Kitchen Referral    Referral request to Neurosurgeon. Need order for CT   Allergies  Allergen Reactions  . Allegra [Fexofenadine Hcl]     Unknown reaction  . Levaquin [Levofloxacin In D5w] Other (See Comments)    Unknown reaction  . Tussionex Pennkinetic Er [Hydrocod Polst-Cpm Polst Er] Nausea And Vomiting  . Codeine Nausea And Vomiting  . Morphine And Related Nausea And Vomiting   HPI Patient is here for hospital follow up. She was in the hospital from 11/06/13-11/08/13 with fall and subdural hematoma. She was then discharged and was readmitted from 11/16/13- 11/19/13 with acute encephalopathy thought to be from a mixture of ecoli uti, dementia and her chronic subdural hematoma. Once she was medically better, SNF was recommended with her weakness. This was refused by patient and son. Patient was also seen by psychiatry and declared not competent to make medical decision. She was discharged home with home health therapy and f/u with neurosurgery.  Patient is here with her son.  On review of discharge summary and meds taken by pt at home, patient is still taking amytiyptyline. Levemir, meloxicam and gabapentin which was stopped prior to d/c from hospital with her AMS and poor po intake Has not checked cbg this am The home health nurse was there today to assess her. She will have PT, OT, HHA services starting tomorrow Will need a referral to neurosurgery for insurance reason Patient is able to provide her history and feels tired but denies any other complaints  Review of Systems  Constitutional: Negative for fever, chills, diaphoresis.  HENT: Negative for congestion, sore throat.   Eyes: Negative for blurred vision, double vision and discharge.  Respiratory: Negative for cough,  shortness of breath and wheezing.   Cardiovascular: Negative for chest pain, palpitations  Gastrointestinal: Negative for heartburn, nausea, vomiting, abdominal pain, diarrhea and constipation.  Genitourinary: Negative for dysuria Musculoskeletal: Negative for back pain, falls Skin: Negative for itching and rash.  Neurological: Positive for weakness. Negative for dizziness, headaches.  Psychiatric/Behavioral: The patient is not nervous/anxious.    Past Medical History  Diagnosis Date  . HTN (hypertension)   . Osteoporosis   . 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 COPD (chronic obstructive pulmonary disease)     no inhalers used   . PONV (postoperative nausea and vomiting)     in past, none recently  . History of TIA (transient ischemic attack) 2012--  NO RESIDUAL  . CHF (congestive heart failure)   . Arthritis     BACK , NECK, AND JOINTS  . Temporal arteritis   . Chronic steroid use   . 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 Macular degeneration of both eyes   . Diabetic peripheral neuropathy   . Insulin dependent diabetes mellitus   . Chronic cystitis   . Hemoperitoneum (nontraumatic)   . Urinary frequency   . Unspecified late effects of cerebrovascular disease   . Abnormality of gait   . Osteoporosis, unspecified   . Anxiety state, unspecified   . Herpes zoster with other nervous system complications(053.19)   . Anemia, unspecified   . Type I (juvenile type) diabetes mellitus without mention  of complication, uncontrolled   . Other and unspecified hyperlipidemia   . Personality change due to conditions classified elsewhere   . Unspecified essential hypertension   . Dermatophytosis of nail   . Depressive disorder, not elsewhere classified   . Diaphragmatic hernia without mention of obstruction or gangrene   . Malignant neoplasm of renal pelvis    Current Outpatient Prescriptions on File Prior to  Visit  Medication Sig Dispense Refill  . acyclovir (ZOVIRAX) 200 MG capsule Take 200 mg by mouth daily. To prevent relapse of herpes simplex.      Marland Kitchen aspirin EC 325 MG tablet Take 325 mg by mouth daily.       . Cranberry 475 MG CAPS Take 1-2 capsules by mouth 2 (two) times daily. Take 1 capsule every morning and 2 capsules every night.      . feeding supplement, GLUCERNA SHAKE, (GLUCERNA SHAKE) LIQD Take 237 mLs by mouth 2 (two) times daily between meals.    0  . metoprolol tartrate (LOPRESSOR) 25 MG tablet Take 0.5 tablets (12.5 mg total) by mouth 3 (three) times a week. On Monday, Wednesday and Friday  30 tablet  3  . Multiple Vitamin (MULTIVITAMIN WITH MINERALS) TABS Take 1 tablet by mouth daily.      . nitrofurantoin, macrocrystal-monohydrate, (MACROBID) 100 MG capsule Take 1 capsule (100 mg total) by mouth 2 (two) times daily. X 4 more days  8 capsule  0  . oxybutynin (DITROPAN) 5 MG tablet Take 1 tablet (5 mg total) by mouth 3 (three) times daily as needed for bladder spasms (Up to 3 times daily to control bladder spasms.).  90 tablet  3  . predniSONE (DELTASONE) 1 MG tablet Take 3 mg by mouth daily with breakfast.      . tobramycin-dexamethasone (TOBRADEX) ophthalmic ointment Place into both eyes every 6 (six) hours. X 5 days  3.5 g  1  . traMADol (ULTRAM) 50 MG tablet Take 50 mg by mouth every 8 (eight) hours as needed (pain.).       No current facility-administered medications on file prior to visit.   Physical exam BP 126/72  Pulse 88  Temp(Src) 98.3 F (36.8 C) (Oral)  Resp 10  Wt 147 lb (66.679 kg)  SpO2 94%  General- elderly female in no acute distress Head- normocephalic, 3 staples on her head, site clean Eyes- PERRLA, EOMI, no pallor, no icterus Neck- no lymphadenopathy, no thyromegaly Cardiovascular- normal s1,s2, no murmurs, no edema Respiratory- bilateral clear to auscultation, no wheeze, no rhonchi, no crackles Abdomen- bowel sounds present, soft, non  tender Musculoskeletal- able to move all 4 extremities,on wheelchair Neurological- no focal deficit Psychiatry- alert and oriented to person, place , normal mood and affect  labs Lab Results  Component Value Date   HGBA1C 7.3* 11/07/2013   Lab Results  Component Value Date   CREATININE 0.77 11/19/2013   Imaging Ct Head Wo Contrast  11/16/2013   CLINICAL DATA:  79 year old female with weakness and fatigue. Recent left frontotemporal subdural hematoma.  EXAM: CT HEAD WITHOUT CONTRAST  TECHNIQUE: Contiguous axial images were obtained from the base of the skull through the vertex without intravenous contrast.  COMPARISON:  11/13/1998 and and and prior head CTs dating back to 06/21/2006  FINDINGS: A left frontotemporal subdural hematoma is unchanged in size but decreased in attenuation measuring up to 5 mm in the frontal region. Edema within the inferior left frontal lobe again noted.  Mild atrophy and chronic small-vessel white matter ischemic  changes are again noted.  There is no evidence of new hemorrhage, acute infarction, hydrocephalus, new extra-axial collection or midline shift.  No acute bony abnormalities are present. Opacification of the visualized left maxillary sinus again noted.  IMPRESSION: Unchanged size of left frontotemporal subdural hematoma with edema in the inferior left frontal lobe again noted.  No new or acute finding identified.  Chronic small-vessel white matter ischemic changes and mild atrophy.   Electronically Signed   By: Hassan Rowan M.D.   On: 11/16/2013 14:23   Dg Chest Portable 1 View  11/16/2013   CLINICAL DATA:  Short of breath and fatigue.  EXAM: PORTABLE CHEST - 1 VIEW  COMPARISON:  Chest radiograph 08/02/2012  FINDINGS: The heart size and mediastinal contours are within normal limits. Calcified atherosclerotic plaque noted within the aortic knob. Both lungs are clear. The visualized skeletal structures are unremarkable.  IMPRESSION: No active disease.   Electronically  Signed   By: Kerby Moors M.D.   On: 11/16/2013 13:24   Assessment/plan  1. Type II or unspecified type diabetes mellitus with renal manifestations, uncontrolled(250.42) Off levemir in hospital with her poor oral intake, missing meals and medication. Reviewed a1c of 7.3. Given her age and dementia, goal a1c to be kept between 7-7.5. D/c SSI as well. Will have her on tradjenta 5 mg daily only for now and reassess  2. Muscle weakness (generalized) Will have her work with physical therapy and occupational therapy team to help with gait training and muscle strengthening exercises.fall precautions. Skin care. Encourage to be out of bed.   3. Subdural hematoma Referral to neurosurgery to assess on subdural hematoma. Pt is aaox 3. Will also need staples removal. Avoid sedative /hypnotic agent where possible. D/c amitriptyline. Fall precautions - Ambulatory referral to Neurosurgery  4. Urinary tract infection, site not specified To complete her course of nitrofurantoin. Encouraged hydration. Pt has not picked her med since discharge yesterday, advised on need to complete the course of antibiotics. Son voices understanding this. Continue cranberry supplement  5. Essential hypertension Stable bp reading- continue lopressor  6. Polymyalgia rheumatica Continue her chronic prednisone and prn tramadol  7. Unspecified urinary incontinence Continue ditropan for now, consider swi

## 2013-11-21 ENCOUNTER — Telehealth: Payer: Self-pay | Admitting: *Deleted

## 2013-11-21 ENCOUNTER — Other Ambulatory Visit: Payer: Self-pay | Admitting: Neurosurgery

## 2013-11-21 DIAGNOSIS — I6200 Nontraumatic subdural hemorrhage, unspecified: Secondary | ICD-10-CM

## 2013-11-21 MED ORDER — LINAGLIPTIN 5 MG PO TABS: 5.0000 mg | ORAL_TABLET | Freq: Every day | ORAL | Status: DC

## 2013-11-21 NOTE — Telephone Encounter (Signed)
Patient was seen yesterday on 11/20/13 with Dr.Pandey, I will forward request to her for approval or denial  Dr.Pandey, please advise

## 2013-11-21 NOTE — Telephone Encounter (Signed)
Called son regarding the medication change per Dr. Bubba Camp, informed Malachi Bonds) that medication was changed to Trajenta instead of Januvia. He stated that he understood the new directions and would pick up the medication this afternoon.I informed him that if he had any questions to call the office.

## 2013-11-23 LAB — CULTURE, BLOOD (ROUTINE X 2)
Culture: NO GROWTH
Culture: NO GROWTH

## 2013-11-24 ENCOUNTER — Other Ambulatory Visit: Payer: Self-pay | Admitting: Internal Medicine

## 2013-11-26 ENCOUNTER — Telehealth: Payer: Self-pay

## 2013-11-26 NOTE — Telephone Encounter (Signed)
Paperwork received, completed, signed and faxed back. Awaiting response from insurance company

## 2013-11-26 NOTE — Telephone Encounter (Signed)
ID # [id number] Called to initiate PA, fax to be sent to office for completion Ref # [id number], review process is 24-72 hours

## 2013-11-28 NOTE — Telephone Encounter (Signed)
[employer] Approved Tradjenta 5mg  through 02/27/2014. # [phone]

## 2013-12-04 ENCOUNTER — Other Ambulatory Visit: Payer: Self-pay | Admitting: *Deleted

## 2013-12-04 MED ORDER — LINAGLIPTIN 5 MG PO TABS: 5.0000 mg | ORAL_TABLET | Freq: Every day | ORAL | Status: DC

## 2013-12-04 NOTE — Telephone Encounter (Signed)
Heather with [facility] called and stated that patient needs a refill on her Tradjenta. I called patient and confirmed pharmacy and faxed it.

## 2013-12-11 ENCOUNTER — Ambulatory Visit (INDEPENDENT_AMBULATORY_CARE_PROVIDER_SITE_OTHER): Payer: Commercial Managed Care - HMO | Admitting: Internal Medicine

## 2013-12-11 ENCOUNTER — Encounter: Payer: Self-pay | Admitting: Internal Medicine

## 2013-12-11 VITALS — BP 122/74 | HR 68 | Temp 98.3°F | Ht 64.0 in | Wt 147.0 lb

## 2013-12-11 DIAGNOSIS — R300 Dysuria: Secondary | ICD-10-CM

## 2013-12-11 DIAGNOSIS — N952 Postmenopausal atrophic vaginitis: Secondary | ICD-10-CM | POA: Insufficient documentation

## 2013-12-11 LAB — POCT URINALYSIS DIPSTICK
Bilirubin, UA: NEGATIVE
Blood, UA: NEGATIVE
Glucose, UA: NEGATIVE
KETONES UA: NEGATIVE
Leukocytes, UA: NEGATIVE
Nitrite, UA: NEGATIVE
PROTEIN UA: NEGATIVE
SPEC GRAV UA: 1.015
Urobilinogen, UA: 0.2
pH, UA: 6

## 2013-12-11 MED ORDER — PHENAZOPYRIDINE HCL 100 MG PO TABS: 100.0000 mg | ORAL_TABLET | Freq: Three times a day (TID) | ORAL | Status: DC | PRN

## 2013-12-11 MED ORDER — SULFAMETHOXAZOLE-TMP DS 800-160 MG PO TABS: 1.0000 | ORAL_TABLET | Freq: Two times a day (BID) | ORAL | Status: DC

## 2013-12-11 MED ORDER — ESTROGENS, CONJUGATED 0.625 MG/GM VA CREA: 1.0000 | TOPICAL_CREAM | Freq: Every day | VAGINAL | Status: DC

## 2013-12-11 NOTE — Progress Notes (Signed)
Patient ID: ARDELLE HALIBURTON, female   DOB: 03/03/1925, 80 y.o.   MRN: [id number]    Chief Complaint  Patient presents with  . Acute Visit    Follow-up from fall   . Urinary Tract Infection    Patient questions if she has a UTI    HPI 80 y/o female patient is here for acute visit. She has been having discomfort with urination for 10 days. She has noticed an odor. Has hx of recurrent uti. Denies vaginal discharge. Not sexually active. Has increased frequency as well  ROS Denies hematuria or flank pain Denies nausea or vomiting Denies abdominal pain Denies fever or chills Denies dyspnea or chest pain  Past Medical History  Diagnosis Date  . HTN (hypertension)   . Osteoporosis   . 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 COPD (chronic obstructive pulmonary disease)     no inhalers used   . PONV (postoperative nausea and vomiting)     in past, none recently  . History of TIA (transient ischemic attack) 2012--  NO RESIDUAL  . CHF (congestive heart failure)   . Arthritis     BACK , NECK, AND JOINTS  . Temporal arteritis   . Chronic steroid use   . 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 Macular degeneration of both eyes   . Diabetic peripheral neuropathy   . Insulin dependent diabetes mellitus   . Chronic cystitis   . Hemoperitoneum (nontraumatic)   . Urinary frequency   . Unspecified late effects of cerebrovascular disease   . Abnormality of gait   . Osteoporosis, unspecified   . Anxiety state, unspecified   . Herpes zoster with other nervous system complications(053.19)   . Anemia, unspecified   . Type I (juvenile type) diabetes mellitus without mention of complication, uncontrolled   . Other and unspecified hyperlipidemia   . Personality change due to conditions classified elsewhere   . Unspecified essential hypertension   . Dermatophytosis of nail   . Depressive disorder, not elsewhere classified    . Diaphragmatic hernia without mention of obstruction or gangrene   . Malignant neoplasm of renal pelvis    Current Outpatient Prescriptions on File Prior to Visit  Medication Sig Dispense Refill  . acyclovir (ZOVIRAX) 200 MG capsule Take 200 mg by mouth daily. To prevent relapse of herpes simplex.      Marland Kitchen aspirin EC 325 MG tablet Take 325 mg by mouth daily.       . Cranberry 475 MG CAPS Take 1-2 capsules by mouth 2 (two) times daily. Take 1 capsule every morning and 2 capsules every night.      . feeding supplement, GLUCERNA SHAKE, (GLUCERNA SHAKE) LIQD Take 237 mLs by mouth 2 (two) times daily between meals.    0  . furosemide (LASIX) 20 MG tablet Take 20 mg by mouth.      . linagliptin (TRADJENTA) 5 MG TABS tablet Take 1 tablet (5 mg total) by mouth daily.  30 tablet  3  . meloxicam (MOBIC) 15 MG tablet Take 15 mg by mouth daily.      . metoprolol tartrate (LOPRESSOR) 25 MG tablet Take 0.5 tablets (12.5 mg total) by mouth 3 (three) times a week. On Monday, Wednesday and Friday  30 tablet  3  . Multiple Vitamin (MULTIVITAMIN WITH MINERALS) TABS Take 1 tablet by mouth daily.      Marland Kitchen oxybutynin (DITROPAN) 5 MG tablet  Take 1 tablet (5 mg total) by mouth 3 (three) times daily as needed for bladder spasms (Up to 3 times daily to control bladder spasms.).  90 tablet  3  . predniSONE (DELTASONE) 1 MG tablet TAKE THREE TABLETS BY MOUTH ONCE DAILY IN THE MORNING  90 tablet  0  . tobramycin-dexamethasone (TOBRADEX) ophthalmic ointment Place into both eyes every 6 (six) hours. X 5 days  3.5 g  1  . traMADol (ULTRAM) 50 MG tablet Take 50 mg by mouth every 8 (eight) hours as needed (pain.).       No current facility-administered medications on file prior to visit.    Physical exam BP 122/74  Pulse 68  Temp(Src) 98.3 F (36.8 C) (Oral)  Ht 5\' 4"  (1.626 m)  Wt 147 lb (66.679 kg)  BMI 25.22 kg/m2  SpO2 96%  General- elderly female in no acute distress Head- atraumatic, normocephalic Neck- no  lymphadenopathy Cardiovascular- normal s1,s2, no murmurs Respiratory- bilateral clear to auscultation, no wheeze, no rhonchi, no crackles Abdomen- bowel sounds present, soft, non tender Pelvic exam- atrophied vulva, erythema present, no vaginal discharge Musculoskeletal- able to move all 4 extremities, no leg edema Neurological- no focal deficit Skin- warm and dry Psychiatry- alert and oriented to person, place and time, normal mood and affect  Labs- Urinalysis    Component Value Date/Time   COLORURINE AMBER* 11/16/2013 1256   APPEARANCEUR CLOUDY* 11/16/2013 1256   LABSPEC 1.022 11/16/2013 1256   PHURINE 5.5 11/16/2013 1256   GLUCOSEU NEGATIVE 11/16/2013 1256   HGBUR TRACE* 11/16/2013 1256   BILIRUBINUR Neg 12/11/2013 1345   BILIRUBINUR NEGATIVE 11/16/2013 1256   BILIRUBINUR Negative 05/07/2013 1436   KETONESUR >80* 11/16/2013 1256   PROTEINUR Neg 12/11/2013 1345   PROTEINUR NEGATIVE 11/16/2013 1256   UROBILINOGEN 0.2 12/11/2013 1345   UROBILINOGEN 1.0 11/16/2013 1256   NITRITE Neg 12/11/2013 1345   NITRITE POSITIVE* 11/16/2013 1256   NITRITE Negative 05/07/2013 1436   LEUKOCYTESUR Negative 12/11/2013 1345   LEUKOCYTESUR Negative 05/07/2013 1436    Assessment/plan  1. Dysuria Send urine culture. Treat empirically with bactrim ds 1 tab q12h for a week. F/u urine culture result. Pyridium 100 mg tid prn pain. Pt would like to be on long term prophylaxis if urine grows bacteria - POCT urinalysis dipstick  2. Atrophic vaginitis Will have her on premarin cream 1 application vaginally

## 2013-12-13 LAB — URINE CULTURE

## 2013-12-16 ENCOUNTER — Telehealth: Payer: Self-pay

## 2013-12-16 MED ORDER — NITROFURANTOIN MACROCRYSTAL 100 MG PO CAPS: 100.0000 mg | ORAL_CAPSULE | Freq: Two times a day (BID) | ORAL | Status: DC

## 2013-12-16 MED ORDER — SACCHAROMYCES BOULARDII 250 MG PO CAPS: 250.0000 mg | ORAL_CAPSULE | Freq: Two times a day (BID) | ORAL | Status: DC

## 2013-12-16 NOTE — Addendum Note (Signed)
Addended by: Logan Bores on: 12/16/2013 10:40 AM   Modules accepted: Orders

## 2013-12-16 NOTE — Telephone Encounter (Signed)
Message copied by Logan Bores on Mon Dec 16, 2013 10:40 AM ------      Message from: Blanchie Serve      Created: Sun Dec 15, 2013  5:17 PM       Your urine is suggestive of bacterial infection. Stop taking bactrim. New antibiotic required. start nitrofurantoin 100 mg bid for 1 week with florastor 250 mg bid for 2 weeks. Encourage hydration ------

## 2013-12-16 NOTE — Telephone Encounter (Signed)
Discussed with patient, patient verbalized understanding of results. New RX''s sent in.

## 2013-12-23 ENCOUNTER — Ambulatory Visit
Admission: RE | Admit: 2013-12-23 | Discharge: 2013-12-23 | Disposition: A | Discharge status: Home / Self Care | Payer: Commercial Managed Care - HMO | Source: Ambulatory Visit | Attending: Neurosurgery | Admitting: Neurosurgery

## 2013-12-23 DIAGNOSIS — I6200 Nontraumatic subdural hemorrhage, unspecified: Secondary | ICD-10-CM

## 2013-12-24 ENCOUNTER — Other Ambulatory Visit: Payer: Self-pay | Admitting: Internal Medicine

## 2013-12-25 ENCOUNTER — Ambulatory Visit
Admission: RE | Admit: 2013-12-25 | Discharge: 2013-12-25 | Disposition: A | Discharge status: Home / Self Care | Payer: Commercial Managed Care - HMO | Source: Ambulatory Visit | Attending: Interventional Radiology | Admitting: Interventional Radiology

## 2013-12-25 DIAGNOSIS — D3001 Benign neoplasm of right kidney: Secondary | ICD-10-CM

## 2013-12-25 NOTE — Progress Notes (Signed)
Chief Complaint: Chief Complaint  Patient presents with  . Follow-up    20 month follow up Cryoablation of Right Renal Oncocytoma   History of Present Illness: [name] is a 80 y.o. female status post cryoablation of a right renal oncocytoma on 03/16/2012. The patient has had some periodic right flank discomfort following the procedure. The procedure was complicated by a posterior perinephric hematoma that became infected and required percutaneous drainage. Ureteral stent placement also had to be performed T2 urine leak. The ablation site is now healed and the patient has no urinary symptoms. She denies fever or chills. She did have a fall in early September and sustained a small left-sided subdural intracranial hemorrhage which did not require drainage and is now resolved by imaging.  Past Medical History  Diagnosis Date  . HTN (hypertension)   . Osteoporosis   . 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 COPD (chronic obstructive pulmonary disease)     no inhalers used   . PONV (postoperative nausea and vomiting)     in past, none recently  . History of TIA (transient ischemic attack) 2012--  NO RESIDUAL  . CHF (congestive heart failure)   . Arthritis     BACK , NECK, AND JOINTS  . Temporal arteritis   . Chronic steroid use   . 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 Macular degeneration of both eyes   . Diabetic peripheral neuropathy   . Insulin dependent diabetes mellitus   . Chronic cystitis   . Hemoperitoneum (nontraumatic)   . Urinary frequency   . Unspecified late effects of cerebrovascular disease   . Abnormality of gait   . Osteoporosis, unspecified   . Anxiety state, unspecified   . Herpes zoster with other nervous system complications(053.19)   . Anemia, unspecified   . Type I (juvenile type) diabetes mellitus without mention of complication, uncontrolled   . Other and unspecified  hyperlipidemia   . Personality change due to conditions classified elsewhere   . Unspecified essential hypertension   . Dermatophytosis of nail   . Depressive disorder, not elsewhere classified   . Diaphragmatic hernia without mention of obstruction or gangrene   . Malignant neoplasm of renal pelvis     Past Surgical History  Procedure Laterality Date  . Lumbar laminectomy  11-19-2003    L3 -- L4  . Total hip arthroplasty Left 01-22-2005  . Total knee arthroplasty Bilateral LEFT 08-08-2007;  RIGHT 05-08-2009  . Anterior cervical decomp/discectomy fusion  10-12-2005    C5 -- C7  . Posterior fusion cervical spine  02-10-2006    C5 -- C7  . Orif w/ plate right distal radius fx  06-18-2003  . Cataract extraction w/ intraocular lens  implant, bilateral Right Oct 1992    Epes  . Cryoablation right renal mass  03-16-2012    ONCOCYTOMA (POST OP 06-09-2012 PERCUTANEOUS DRAIN PLACEMENT FOR PERINEPHRIC ABSCESS)  . Temporal artery biopsy / ligation    . Elbow surgery Bilateral 2003  (APPROX)  . Carpal tunnel release Right 2009  (APPROX)  . Cystoscopy w/ ureteral stent placement Right 07/10/2012    Procedure: CYSTOSCOPY WITH RIGHT RETROGRADE PYELOGRAM/URETERAL STENT PLACEMENT;  Surgeon: Malka So, MD;  Location[facility];  Service: Urology;  Laterality: Right;  . Hip arthroplasty Right 08/03/2012    Procedure: CEMENTED MONOPOLAR HIP;  Surgeon: Marybelle Killings, MD;  Location: [facility];  Service:  Orthopedics;  Laterality: Right;  . Cataract extraction w/ intraocular lens implant Left 1993    Epes    Allergies: Allegra; Levaquin; Tussionex pennkinetic er; Codeine; and Morphine and related  Medications: Prior to Admission medications   Medication Sig Start Date End Date Taking? Authorizing Provider  acyclovir (ZOVIRAX) 200 MG capsule Take 200 mg by mouth daily. To prevent relapse of herpes simplex.    Historical Provider, MD  aspirin EC 325 MG tablet Take 325 mg by mouth daily.      Historical Provider, MD  conjugated estrogens (PREMARIN) vaginal cream Place 1 Applicatorful vaginally daily. 12/11/13   Blanchie Serve, MD  Cranberry 475 MG CAPS Take 1-2 capsules by mouth 2 (two) times daily. Take 1 capsule every morning and 2 capsules every night.    Historical Provider, MD  feeding supplement, GLUCERNA SHAKE, (GLUCERNA SHAKE) LIQD Take 237 mLs by mouth 2 (two) times daily between meals. 11/19/13   Ripudeep Krystal Eaton, MD  furosemide (LASIX) 20 MG tablet Take 20 mg by mouth.    Historical Provider, MD  linagliptin (TRADJENTA) 5 MG TABS tablet Take 1 tablet (5 mg total) by mouth daily. 12/04/13   Estill Dooms, MD  meloxicam (MOBIC) 15 MG tablet Take 15 mg by mouth daily.    Historical Provider, MD  metoprolol tartrate (LOPRESSOR) 25 MG tablet Take 0.5 tablets (12.5 mg total) by mouth 3 (three) times a week. On Monday, Wednesday and Friday 11/19/13   Ripudeep Krystal Eaton, MD  Multiple Vitamin (MULTIVITAMIN WITH MINERALS) TABS Take 1 tablet by mouth daily.    Historical Provider, MD  nitrofurantoin (MACRODANTIN) 100 MG capsule Take 1 capsule (100 mg total) by mouth 2 (two) times daily. 12/16/13   Blanchie Serve, MD  oxybutynin (DITROPAN) 5 MG tablet Take 1 tablet (5 mg total) by mouth 3 (three) times daily as needed for bladder spasms (Up to 3 times daily to control bladder spasms.). 11/19/13   Ripudeep Krystal Eaton, MD  phenazopyridine (PYRIDIUM) 100 MG tablet Take 1 tablet (100 mg total) by mouth 3 (three) times daily as needed for pain. 12/11/13   Mahima Bubba Camp, MD  predniSONE (DELTASONE) 1 MG tablet TAKE THREE TABLETS BY MOUTH ONCE DAILY IN THE MORNING 12/25/13   Blanchie Serve, MD  saccharomyces boulardii (FLORASTOR) 250 MG capsule Take 1 capsule (250 mg total) by mouth 2 (two) times daily. 12/16/13   Blanchie Serve, MD  tobramycin-dexamethasone (TOBRADEX) ophthalmic ointment Place into both eyes every 6 (six) hours. X 5 days 11/19/13   Ripudeep Krystal Eaton, MD  traMADol (ULTRAM) 50 MG tablet Take 50 mg by  mouth every 8 (eight) hours as needed (pain.).    Historical Provider, MD    Family History  Problem Relation Age of Onset  . Leukemia    . Cancer Mother   . Cancer Father   . Cancer Brother   . Cancer Daughter     History   Social History  . Marital Status: Widowed    Spouse Name: N/A    Number of Children: N/A  . Years of Education: N/A   Social History Main Topics  . Smoking status: Former Smoker -- 2.00 packs/day for 30 years    Types: Cigarettes    Quit date: 01/11/1983  . Smokeless tobacco: Never Used  . Alcohol Use: No  . Drug Use: No  . Sexual Activity: Not on file   Other Topics Concern  . Not on file   Social History Narrative   Limited 2002.  Lives in her apartment. Has a living will.   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             Review of Systems: A 12 point ROS discussed and pertinent positives are indicated in the HPI above.  All other systems are negative.  Review of Systems  Constitutional: Negative for fever, chills and diaphoresis.  Respiratory: Negative.   Cardiovascular: Negative.   Gastrointestinal: Positive for abdominal pain. Negative for nausea, vomiting, diarrhea, constipation and abdominal distention.  Genitourinary: Positive for flank pain. Negative for dysuria, urgency, frequency, hematuria and difficulty urinating.  Skin: Negative.   Neurological: Negative.     Vital Signs: BP 129/76  Pulse 68  Temp(Src) 98.2 F (36.8 C) (Oral)  Resp 15  SpO2 97%  Physical Exam  Constitutional: She appears well-developed and well-nourished. No distress.  Abdominal: Soft. She exhibits mass. She exhibits no distension. There is no tenderness. There is no rebound and no guarding.  Skin: Skin is warm and dry. No rash noted. No erythema.    Imaging: Ct Head Wo Contrast  12/23/2013   CLINICAL DATA:  Followup subdural hematoma  EXAM: CT HEAD WITHOUT CONTRAST  TECHNIQUE: Contiguous axial  images were obtained from the base of the skull through the vertex without intravenous contrast.  COMPARISON:  CT head 11/16/2013  FINDINGS: Interval resolution of left frontal subdural hematoma. No residual fluid collection identified in the left frontal region.  Generalized mild atrophy. Chronic microvascular ischemic change in the white matter.  Negative for acute infarct.  Negative for acute hemorrhage or mass.  Calvarium intact.  IMPRESSION: Interval resolution of left frontal subdural hematoma. No acute abnormality.   Electronically Signed   By: Franchot Gallo M.D.   On: 12/23/2013 17:03    Labs:  CBC:  Recent Labs  11/12/13 2127 11/16/13 1206 11/17/13 1134 11/19/13 0421  WBC 6.6 6.3 6.6 7.5  HGB 11.0* 12.1 12.3 12.4  HCT 33.2* 36.0 37.0 37.1  PLT 169 177 198 186    COAGS:  Recent Labs  11/12/13 2127  INR 1.12    BMP:  Recent Labs  11/12/13 2127 11/16/13 1206 11/17/13 1134 11/19/13 0421  NA 140 138 140 141  K 4.7 4.3 4.3 4.0  CL 102 99 100 100  CO2 25 19 20 25   GLUCOSE 190* 118* 120* 121*  BUN 43* 21 24* 25*  CALCIUM 9.7 9.7 9.4 9.7  CREATININE 1.28* 0.79 0.82 0.77  GFRNONAA 37* 73* 63* 74*  GFRAA 43* 85* 73* 86*    LIVER FUNCTION TESTS:  Recent Labs  01/15/13 1603 09/04/13 1508 11/06/13 1624 11/16/13 1206  BILITOT 0.4 0.3 0.4 1.1  AST 26 24 19 25   ALT 15 13 12 15   ALKPHOS 70 70 73 84  PROT 6.3 7.0 6.6 6.9  ALBUMIN  --   --  3.4* 3.5    TUMOR MARKERS: No results found for this basename: AFPTM, CEA, CA199, CHROMGRNA,  in the last 8760 hours  Assessment and Plan:  Followup abdominal CT was performed on 11/06/2013. There was delay in performing a followup office visit due to the subdural hemorrhage sustained around the time of the followup CT. I reviewed CT imaging findings today with the patient and her son.   This shows some retraction of the posterior right upper renal ablation defect with no evidence of residual enhancing tumor. Some  stranding remains in the posterior perinephric fat. Small hyperdense cortical lesions are  noted in both kidneys with a 7 mm lower pole lesion of the right kidney and an 8 mm anterior interpolar lesion of the left kidney. These may be benign but appear to be potentially minimally larger, especially the small lesion of the left kidney.  I did recommend followup imaging in one year. The patient is moving next week with her son to [place], [place] to care for her daughter. She is currently in the process of establishing medical care there. Once she has an established physician, I recommended that she or her son call our office so that we may fax reports over to her new physician regarding prior cryoablation of the right kidney and recommendation to follow the ablation site as well as the small bilateral cortical renal lesions with followup CT in one year. Renal function is normal and has been stable.  I spent a total of 20 minutes face to face in clinical consultation, greater than 50% of which was counseling/coordinating care post renal ablation.  Venetia Night. Kathlene Cote, M.D. Pager:  [phone]      Signed: Aletta Edouard T 12/25/2013, 4:08 PM

## 2013-12-27 ENCOUNTER — Telehealth: Payer: Self-pay

## 2013-12-27 NOTE — Telephone Encounter (Signed)
Nira Conn was calling with multiple concerns for patient 1) Patient was still taking Januvia vs Tradjenta. Patient was told today to discard Januvia and will now begin Tradjenta tomorrow. BS are running good. BS today ws 112  2.) Dull headaches  3.) [facility]- met all goals. Patient is also moving a hour away  4.) Questions if patient should be on vit D  5.) Painful urination- will be addressed by Urologist Juluis Rainier)  I informed Nira Conn I will call patient to offer appointment, Nira Conn agreed that is probably best   Called patient, offered appointment for Monday with Dr.Reed, patient states she already has 2 appointments on Monday. Patient was scheduled to see Janett Billow (NP) on Thursday. Patient will call next week to see if we have and cancellations

## 2014-01-01 ENCOUNTER — Other Ambulatory Visit: Payer: Self-pay | Admitting: *Deleted

## 2014-01-01 DIAGNOSIS — R300 Dysuria: Secondary | ICD-10-CM

## 2014-01-02 ENCOUNTER — Ambulatory Visit (INDEPENDENT_AMBULATORY_CARE_PROVIDER_SITE_OTHER): Payer: Commercial Managed Care - HMO | Admitting: Nurse Practitioner

## 2014-01-02 ENCOUNTER — Encounter: Payer: Self-pay | Admitting: Nurse Practitioner

## 2014-01-02 VITALS — BP 148/84 | HR 67 | Temp 97.9°F | Resp 18 | Ht 64.0 in | Wt 152.6 lb

## 2014-01-02 DIAGNOSIS — R519 Headache, unspecified: Secondary | ICD-10-CM

## 2014-01-02 DIAGNOSIS — I10 Essential (primary) hypertension: Secondary | ICD-10-CM

## 2014-01-02 DIAGNOSIS — R51 Headache: Secondary | ICD-10-CM

## 2014-01-02 MED ORDER — GLUCERNA SHAKE PO LIQD: 237.0000 mL | Freq: Two times a day (BID) | ORAL | Status: AC

## 2014-01-02 MED ORDER — ASPIRIN EC 325 MG PO TBEC: 325.0000 mg | DELAYED_RELEASE_TABLET | Freq: Every day | ORAL | Status: DC

## 2014-01-02 MED ORDER — LINAGLIPTIN 5 MG PO TABS: 5.0000 mg | ORAL_TABLET | Freq: Every day | ORAL | Status: DC

## 2014-01-02 MED ORDER — GABAPENTIN 300 MG PO CAPS: 300.0000 mg | ORAL_CAPSULE | Freq: Three times a day (TID) | ORAL | Status: DC

## 2014-01-02 MED ORDER — OXYBUTYNIN CHLORIDE 5 MG PO TABS: 5.0000 mg | ORAL_TABLET | Freq: Three times a day (TID) | ORAL | Status: AC | PRN

## 2014-01-02 MED ORDER — MELOXICAM 15 MG PO TABS: 15.0000 mg | ORAL_TABLET | Freq: Every day | ORAL | Status: DC

## 2014-01-02 MED ORDER — ACYCLOVIR 200 MG PO CAPS: 200.0000 mg | ORAL_CAPSULE | Freq: Every day | ORAL | Status: DC

## 2014-01-02 MED ORDER — METOPROLOL TARTRATE 25 MG PO TABS: 12.5000 mg | ORAL_TABLET | ORAL | Status: DC

## 2014-01-02 MED ORDER — FUROSEMIDE 20 MG PO TABS: 20.0000 mg | ORAL_TABLET | Freq: Every day | ORAL | Status: DC

## 2014-01-02 MED ORDER — ESTROGENS, CONJUGATED 0.625 MG/GM VA CREA: 1.0000 | TOPICAL_CREAM | Freq: Every day | VAGINAL | Status: DC

## 2014-01-02 NOTE — Patient Instructions (Signed)
Will provide New Prescription to last you until you get to your new Doctors appt.   If headaches continue you need to see a Neurologist once you move (you will need a referral from your new doctor)

## 2014-01-02 NOTE — Progress Notes (Signed)
Patient ID: [name], female   DOB: [birth year], 80 y.o.   MRN: [id number]    PCP: Estill Dooms, MD  Allergies  Allergen Reactions  . Allegra [Fexofenadine Hcl]     Unknown reaction  . Levaquin [Levofloxacin In D5w] Other (See Comments)    Unknown reaction  . Tussionex Pennkinetic Er [Hydrocod Polst-Cpm Polst Er] Nausea And Vomiting  . Codeine Nausea And Vomiting  . Morphine And Related Nausea And Vomiting    Chief Complaint  Patient presents with  . Acute Visit    headache since fall in Sept.     HPI: Patient is a 80 y.o. female seen in the office today due to headache per notes. Pt reports she does not know why she is here. Pt of Dr Nyoka Cowden with pmh of HTN, OP, COPD, CHF, DM and more. Reports she has head a headache ever since she had her fall in september. Pt was admitted to the hospital after fall at home and initial imaging studies revealed a 6 mm left frontal and temporal lobe subdural hematoma. she had a repeat head CT on 11/12/2013 which showed stable left frontal and left temporal subdural hematomas with no interim intracranial change. Pt has not had follow up with neurology since the hospital. Has had history of headaches, reports it is more like a nag not really a pain. takes aleve and it goes away.  No recurrent falls. No dizziness, not lightheaded. No nausea or vomiting, no gait abnormalities, no changes in vision (has poor vision)Reports blood pressure has been normal at home (120s/70s) but elevated today. Pt reports she feels like she is stressed due to moving. Pt is moving to [place]. She reports she will have to find a doctor down there to follow up with.    Review of Systems:  Review of Systems  Constitutional: Positive for activity change and fatigue. Negative for fever, chills, diaphoresis and appetite change.  HENT: Positive for hearing loss. Negative for congestion, ear discharge, ear pain, facial swelling, nosebleeds, postnasal drip, rhinorrhea, sinus  pressure, sneezing, sore throat and trouble swallowing.   Eyes: Negative.   Respiratory: Negative for cough, shortness of breath and wheezing.   Cardiovascular: Negative for chest pain and palpitations.  Gastrointestinal: Positive for constipation. Negative for nausea, vomiting, diarrhea and rectal pain.  Genitourinary: Positive for urgency. Negative for hematuria, genital sores and pelvic pain.  Musculoskeletal: Positive for back pain, gait problem (uses cane ) and myalgias. Negative for joint swelling.  Skin: Positive for pallor.  Allergic/Immunologic: Negative.   Neurological: Positive for weakness (ongoing) and headaches. Negative for dizziness, tremors, seizures, syncope, speech difficulty, light-headedness and numbness.  Hematological: Negative.   Psychiatric/Behavioral: Negative for behavioral problems and confusion.       Short-term memory loss.    Past Medical History  Diagnosis Date  . HTN (hypertension)   . Osteoporosis   . 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 COPD (chronic obstructive pulmonary disease)     no inhalers used   . PONV (postoperative nausea and vomiting)     in past, none recently  . History of TIA (transient ischemic attack) 2012--  NO RESIDUAL  . CHF (congestive heart failure)   . Arthritis     BACK , NECK, AND JOINTS  . Temporal arteritis   . Chronic steroid use   . 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 Macular degeneration of both eyes   .  Diabetic peripheral neuropathy   . Insulin dependent diabetes mellitus   . Chronic cystitis   . Hemoperitoneum (nontraumatic)   . Urinary frequency   . Unspecified late effects of cerebrovascular disease   . Abnormality of gait   . Osteoporosis, unspecified   . Anxiety state, unspecified   . Herpes zoster with other nervous system complications(053.19)   . Anemia, unspecified   . Type I (juvenile type) diabetes mellitus without  mention of complication, uncontrolled   . Other and unspecified hyperlipidemia   . Personality change due to conditions classified elsewhere   . Unspecified essential hypertension   . Dermatophytosis of nail   . Depressive disorder, not elsewhere classified   . Diaphragmatic hernia without mention of obstruction or gangrene   . Malignant neoplasm of renal pelvis    Past Surgical History  Procedure Laterality Date  . Lumbar laminectomy  11-19-2003    L3 -- L4  . Total hip arthroplasty Left 01-22-2005  . Total knee arthroplasty Bilateral LEFT 08-08-2007;  RIGHT 05-08-2009  . Anterior cervical decomp/discectomy fusion  10-12-2005    C5 -- C7  . Posterior fusion cervical spine  02-10-2006    C5 -- C7  . Orif w/ plate right distal radius fx  06-18-2003  . Cataract extraction w/ intraocular lens  implant, bilateral Right Oct 1992    Epes  . Cryoablation right renal mass  03-16-2012    ONCOCYTOMA (POST OP 06-09-2012 PERCUTANEOUS DRAIN PLACEMENT FOR PERINEPHRIC ABSCESS)  . Temporal artery biopsy / ligation    . Elbow surgery Bilateral 2003  (APPROX)  . Carpal tunnel release Right 2009  (APPROX)  . Cystoscopy w/ ureteral stent placement Right 07/10/2012    Procedure: CYSTOSCOPY WITH RIGHT RETROGRADE PYELOGRAM/URETERAL STENT PLACEMENT;  Surgeon: Malka So, MD;  Location: [facility];  Service: Urology;  Laterality: Right;  . Hip arthroplasty Right 08/03/2012    Procedure: CEMENTED MONOPOLAR HIP;  Surgeon: Marybelle Killings, MD;  Location: [facility];  Service: Orthopedics;  Laterality: Right;  . Cataract extraction w/ intraocular lens implant Left 1993    Epes   Social History:   reports that she quit smoking about 30 years ago. Her smoking use included Cigarettes. She has a 60 pack-year smoking history. She has never used smokeless tobacco. She reports that she does not drink alcohol or use illicit drugs.  Family History  Problem Relation Age of Onset  . Leukemia    . Cancer  Mother   . Cancer Father   . Cancer Brother   . Cancer Daughter     Medications: Patient's Medications  New Prescriptions   No medications on file  Previous Medications   ACYCLOVIR (ZOVIRAX) 200 MG CAPSULE    Take 200 mg by mouth daily. To prevent relapse of herpes simplex.   ASPIRIN EC 325 MG TABLET    Take 325 mg by mouth daily.    CONJUGATED ESTROGENS (PREMARIN) VAGINAL CREAM    Place 1 Applicatorful vaginally daily.   CRANBERRY 475 MG CAPS    Take 1-2 capsules by mouth 2 (two) times daily. Take 1 capsule every morning and 2 capsules every night.   FEEDING SUPPLEMENT, GLUCERNA SHAKE, (GLUCERNA SHAKE) LIQD    Take 237 mLs by mouth 2 (two) times daily between meals.   FUROSEMIDE (LASIX) 20 MG TABLET    Take 20 mg by mouth.   GABAPENTIN (NEURONTIN) 300 MG CAPSULE    Take 300 mg by mouth 3 (three) times daily. Taking 1  tab in the am , and 2 tabs in the pm.   LINAGLIPTIN (TRADJENTA) 5 MG TABS TABLET    Take 1 tablet (5 mg total) by mouth daily.   MELOXICAM (MOBIC) 15 MG TABLET    Take 15 mg by mouth daily.   METOPROLOL TARTRATE (LOPRESSOR) 25 MG TABLET    Take 0.5 tablets (12.5 mg total) by mouth 3 (three) times a week. On Monday, Wednesday and Friday   MULTIPLE VITAMIN (MULTIVITAMIN WITH MINERALS) TABS    Take 1 tablet by mouth daily.   NITROFURANTOIN (MACRODANTIN) 100 MG CAPSULE    Take 1 capsule (100 mg total) by mouth 2 (two) times daily.   OXYBUTYNIN (DITROPAN) 5 MG TABLET    Take 1 tablet (5 mg total) by mouth 3 (three) times daily as needed for bladder spasms (Up to 3 times daily to control bladder spasms.).   PHENAZOPYRIDINE (PYRIDIUM) 100 MG TABLET    Take 1 tablet (100 mg total) by mouth 3 (three) times daily as needed for pain.   PREDNISONE (DELTASONE) 1 MG TABLET    TAKE THREE TABLETS BY MOUTH ONCE DAILY IN THE MORNING   SACCHAROMYCES BOULARDII (FLORASTOR) 250 MG CAPSULE    Take 1 capsule (250 mg total) by mouth 2 (two) times daily.  Modified Medications   No medications on file   Discontinued Medications   TOBRAMYCIN-DEXAMETHASONE (TOBRADEX) OPHTHALMIC OINTMENT    Place into both eyes every 6 (six) hours. X 5 days   TRAMADOL (ULTRAM) 50 MG TABLET    Take 50 mg by mouth every 8 (eight) hours as needed (pain.).     Physical Exam:  Filed Vitals:   01/02/14 0827  BP: 170/90  Pulse: 67  Temp: 97.9 F (36.6 C)  TempSrc: Oral  Resp: 18  Height: 5\' 4"  (1.626 m)  Weight: 152 lb 9.6 oz (69.219 kg)  SpO2: 95%    Physical Exam  Constitutional: She is oriented to person, place, and time. No distress.  Frail elderly female  HENT:  Head: Normocephalic and atraumatic.  Right Ear: External ear normal.  Left Ear: External ear normal.  Nose: Nose normal.  Mouth/Throat: Oropharynx is clear and moist. No oropharyngeal exudate.  Partial loss of hearing bilaterally.   Eyes: Conjunctivae and EOM are normal. Pupils are equal, round, and reactive to light.  Status post bilateral cataract extractions with intraocular lens implants.  Neck: Normal range of motion. Neck supple. No JVD present. No thyromegaly present.  Cardiovascular: Normal rate, regular rhythm, normal heart sounds and intact distal pulses.  Exam reveals no gallop and no friction rub.   No murmur heard. Pulmonary/Chest: Effort normal and breath sounds normal. No respiratory distress. She has no wheezes.  Abdominal: Soft. Bowel sounds are normal. She exhibits no distension and no mass. There is no tenderness.  Musculoskeletal: Normal range of motion. She exhibits no edema and no tenderness.  Unstable gait. Using cane and walker. Deformities of the hands and knuckles. Ulnar drift. Swollen at the MCP joints. Heberden's nodes.  Lymphadenopathy:    She has no cervical adenopathy.  Neurological: She is alert and oriented to person, place, and time. No cranial nerve deficit.  Skin: Skin is warm and dry.  Psychiatric: She has a normal mood and affect.    Labs reviewed: Basic Metabolic Panel:  Recent Labs   11/16/13 1206 11/17/13 1134 11/19/13 0421  NA 138 140 141  K 4.3 4.3 4.0  CL 99 100 100  CO2 19 20 25   GLUCOSE  118* 120* 121*  BUN 21 24* 25*  CREATININE 0.79 0.82 0.77  CALCIUM 9.7 9.4 9.7   Liver Function Tests:  Recent Labs  09/04/13 1508 11/06/13 1624 11/16/13 1206  AST 24 19 25   ALT 13 12 15   ALKPHOS 70 73 84  BILITOT 0.3 0.4 1.1  PROT 7.0 6.6 6.9  ALBUMIN  --  3.4* 3.5   No results found for this basename: LIPASE, AMYLASE,  in the last 8760 hours No results found for this basename: AMMONIA,  in the last 8760 hours CBC:  Recent Labs  11/06/13 1624  11/12/13 2127 11/16/13 1206 11/17/13 1134 11/19/13 0421  WBC 7.2  < > 6.6 6.3 6.6 7.5  NEUTROABS 5.7  --  4.0 4.2  --   --   HGB 11.6*  < > 11.0* 12.1 12.3 12.4  HCT 35.5*  < > 33.2* 36.0 37.0 37.1  MCV 93.2  < > 93.8 91.6 91.6 91.8  PLT 156  < > 169 177 198 186  < > = values in this interval not displayed. Lipid Panel:  Recent Labs  01/15/13 1603 09/04/13 1508  HDL 68 62  LDLCALC 56 56  TRIG 57 65  CHOLHDL 2.0 2.1   TSH: No results found for this basename: TSH,  in the last 8760 hours A1C: Lab Results  Component Value Date   HGBA1C 7.3* 11/07/2013     Assessment/Plan   1. Headache -history of headaches, reports this is a nagging headache that goes away with aleve, with her recent subdural hematoma instructed her sh needs to follow up with neurology if they continue. She is moving this weekend and will establish with new physicians in her new town.  2.Hypertension -blood pressure improved on recheck to 148/64, cont current medication

## 2014-01-06 ENCOUNTER — Other Ambulatory Visit: Payer: Commercial Managed Care - HMO

## 2014-01-08 ENCOUNTER — Ambulatory Visit: Payer: Commercial Managed Care - HMO | Admitting: Internal Medicine

## 2014-04-22 ENCOUNTER — Other Ambulatory Visit: Payer: Self-pay | Admitting: Internal Medicine

## 2014-06-16 ENCOUNTER — Other Ambulatory Visit: Payer: Self-pay | Admitting: Internal Medicine

## 2014-07-21 ENCOUNTER — Other Ambulatory Visit: Payer: Self-pay | Admitting: *Deleted

## 2014-07-21 MED ORDER — ACYCLOVIR 200 MG PO CAPS: 200.0000 mg | ORAL_CAPSULE | Freq: Every day | ORAL | Status: DC

## 2014-07-21 NOTE — Telephone Encounter (Signed)
Pharmacy called and requested refill

## 2014-09-06 ENCOUNTER — Other Ambulatory Visit: Payer: Self-pay | Admitting: Internal Medicine

## 2014-10-27 ENCOUNTER — Other Ambulatory Visit: Payer: Self-pay | Admitting: Internal Medicine

## 2015-06-02 ENCOUNTER — Telehealth: Payer: Self-pay

## 2015-06-02 NOTE — Telephone Encounter (Signed)
I call patient to verify whether or not she was still being seen by a provider of this office. Patient has not had an OV since 01-02-14 and has not been seen in a nursing facility. I called to verify because a fax was received from [place] that requested a Physician Order Authorization for home delivery of diabetic testing supplies.   I left a message on voicemail for the patient to call the office.

## 2015-08-06 ENCOUNTER — Telehealth: Payer: Self-pay

## 2015-08-06 NOTE — Telephone Encounter (Signed)
I called [facility] @  [phone] to inform them to follow-up with patient as well for her last visit here was 2015

## 2015-08-06 NOTE — Telephone Encounter (Signed)
Left message on voicemail for patient to return call when available, reason for call. Form received from [place] for DM testing supplies. Patient last seen in 2015. Patient will need an OV in order for Dr.Green to complete form, otherwise patient needs to contact the pharmacy and inform them of her new PCP (if she is under the care of another PCP)

## 2016-07-18 ENCOUNTER — Ambulatory Visit: Payer: Self-pay | Admitting: Family Medicine

## 2016-08-08 ENCOUNTER — Ambulatory Visit: Payer: Self-pay | Admitting: Family Medicine

## 2016-08-11 ENCOUNTER — Ambulatory Visit: Payer: Self-pay | Admitting: Family Medicine

## 2016-08-16 ENCOUNTER — Ambulatory Visit: Payer: Self-pay | Admitting: Family Medicine

## 2016-09-11 ENCOUNTER — Emergency Department (HOSPITAL_BASED_OUTPATIENT_CLINIC_OR_DEPARTMENT_OTHER)
Admission: EM | Admit: 2016-09-11 | Discharge: 2016-09-11 | Disposition: A | Discharge status: Home / Self Care | Payer: Medicare Other | Attending: Emergency Medicine | Admitting: Emergency Medicine

## 2016-09-11 ENCOUNTER — Emergency Department (HOSPITAL_BASED_OUTPATIENT_CLINIC_OR_DEPARTMENT_OTHER): Payer: Medicare Other

## 2016-09-11 ENCOUNTER — Encounter (HOSPITAL_BASED_OUTPATIENT_CLINIC_OR_DEPARTMENT_OTHER): Payer: Self-pay | Admitting: Emergency Medicine

## 2016-09-11 DIAGNOSIS — N390 Urinary tract infection, site not specified: Secondary | ICD-10-CM | POA: Diagnosis not present

## 2016-09-11 DIAGNOSIS — Z79899 Other long term (current) drug therapy: Secondary | ICD-10-CM | POA: Diagnosis not present

## 2016-09-11 DIAGNOSIS — E1022 Type 1 diabetes mellitus with diabetic chronic kidney disease: Secondary | ICD-10-CM | POA: Insufficient documentation

## 2016-09-11 DIAGNOSIS — Z96659 Presence of unspecified artificial knee joint: Secondary | ICD-10-CM | POA: Diagnosis not present

## 2016-09-11 DIAGNOSIS — N183 Chronic kidney disease, stage 3 (moderate): Secondary | ICD-10-CM | POA: Insufficient documentation

## 2016-09-11 DIAGNOSIS — C659 Malignant neoplasm of unspecified renal pelvis: Secondary | ICD-10-CM | POA: Insufficient documentation

## 2016-09-11 DIAGNOSIS — N132 Hydronephrosis with renal and ureteral calculous obstruction: Secondary | ICD-10-CM | POA: Diagnosis not present

## 2016-09-11 DIAGNOSIS — K8020 Calculus of gallbladder without cholecystitis without obstruction: Secondary | ICD-10-CM | POA: Insufficient documentation

## 2016-09-11 DIAGNOSIS — R51 Headache: Secondary | ICD-10-CM | POA: Insufficient documentation

## 2016-09-11 DIAGNOSIS — Z87891 Personal history of nicotine dependence: Secondary | ICD-10-CM | POA: Insufficient documentation

## 2016-09-11 DIAGNOSIS — Z7982 Long term (current) use of aspirin: Secondary | ICD-10-CM | POA: Insufficient documentation

## 2016-09-11 DIAGNOSIS — I110 Hypertensive heart disease with heart failure: Secondary | ICD-10-CM | POA: Insufficient documentation

## 2016-09-11 DIAGNOSIS — R1013 Epigastric pain: Secondary | ICD-10-CM

## 2016-09-11 DIAGNOSIS — Z7984 Long term (current) use of oral hypoglycemic drugs: Secondary | ICD-10-CM | POA: Insufficient documentation

## 2016-09-11 DIAGNOSIS — E109 Type 1 diabetes mellitus without complications: Secondary | ICD-10-CM | POA: Diagnosis not present

## 2016-09-11 DIAGNOSIS — I509 Heart failure, unspecified: Secondary | ICD-10-CM | POA: Insufficient documentation

## 2016-09-11 DIAGNOSIS — R0602 Shortness of breath: Secondary | ICD-10-CM | POA: Diagnosis present

## 2016-09-11 DIAGNOSIS — J449 Chronic obstructive pulmonary disease, unspecified: Secondary | ICD-10-CM | POA: Diagnosis not present

## 2016-09-11 DIAGNOSIS — N135 Crossing vessel and stricture of ureter without hydronephrosis: Secondary | ICD-10-CM

## 2016-09-11 LAB — COMPREHENSIVE METABOLIC PANEL
ALK PHOS: 66 U/L (ref 38–126)
ALT: 28 U/L (ref 14–54)
ANION GAP: 11 (ref 5–15)
AST: 25 U/L (ref 15–41)
Albumin: 3.7 g/dL (ref 3.5–5.0)
BILIRUBIN TOTAL: 0.6 mg/dL (ref 0.3–1.2)
BUN: 47 mg/dL — ABNORMAL HIGH (ref 6–20)
CALCIUM: 9.5 mg/dL (ref 8.9–10.3)
CO2: 25 mmol/L (ref 22–32)
Chloride: 102 mmol/L (ref 101–111)
Creatinine, Ser: 1.24 mg/dL — ABNORMAL HIGH (ref 0.44–1.00)
GFR, EST AFRICAN AMERICAN: 43 mL/min — AB (ref >60)
GFR, EST NON AFRICAN AMERICAN: 37 mL/min — AB (ref >60)
GLUCOSE: 182 mg/dL — AB (ref 65–99)
Potassium: 4.6 mmol/L (ref 3.5–5.1)
Sodium: 138 mmol/L (ref 135–145)
TOTAL PROTEIN: 7.2 g/dL (ref 6.5–8.1)

## 2016-09-11 LAB — URINALYSIS, ROUTINE W REFLEX MICROSCOPIC
BILIRUBIN URINE: NEGATIVE
Glucose, UA: NEGATIVE mg/dL
Ketones, ur: NEGATIVE mg/dL
Nitrite: POSITIVE — AB
Protein, ur: NEGATIVE mg/dL
SPECIFIC GRAVITY, URINE: 1.017 (ref 1.005–1.030)
pH: 5.5 (ref 5.0–8.0)

## 2016-09-11 LAB — CBC
HEMATOCRIT: 39.4 % (ref 36.0–46.0)
HEMOGLOBIN: 13.3 g/dL (ref 12.0–15.0)
MCH: 32.4 pg (ref 26.0–34.0)
MCHC: 33.8 g/dL (ref 30.0–36.0)
MCV: 95.9 fL (ref 78.0–100.0)
Platelets: 104 10*3/uL — ABNORMAL LOW (ref 150–400)
RBC: 4.11 MIL/uL (ref 3.87–5.11)
RDW: 14.1 % (ref 11.5–15.5)
WBC: 9.6 10*3/uL (ref 4.0–10.5)

## 2016-09-11 LAB — URINALYSIS, MICROSCOPIC (REFLEX)

## 2016-09-11 LAB — TROPONIN I: Troponin I: <0.03 ng/mL (ref <0.03)

## 2016-09-11 LAB — LIPASE, BLOOD: Lipase: 29 U/L (ref 11–51)

## 2016-09-11 LAB — CBG MONITORING, ED: Glucose-Capillary: 180 mg/dL — ABNORMAL HIGH (ref 65–99)

## 2016-09-11 MED ORDER — IOPAMIDOL (ISOVUE-300) INJECTION 61%: 100.0000 mL | Freq: Once | INTRAVENOUS | Status: DC | PRN

## 2016-09-11 MED ORDER — PANTOPRAZOLE SODIUM 40 MG IV SOLR
40.0000 mg | Freq: Once | INTRAVENOUS | Status: AC
  Administered 2016-09-11: 40 mg via INTRAVENOUS
  Filled 2016-09-11: qty 40

## 2016-09-11 MED ORDER — RANITIDINE HCL 150 MG PO TABS: 150.0000 mg | ORAL_TABLET | Freq: Two times a day (BID) | ORAL | 0 refills | Status: DC

## 2016-09-11 MED ORDER — IOPAMIDOL (ISOVUE-370) INJECTION 76%
100.0000 mL | Freq: Once | INTRAVENOUS | Status: AC | PRN
  Administered 2016-09-11: 75 mL via INTRAVENOUS

## 2016-09-11 MED ORDER — DEXTROSE 5 % IV SOLN
1.0000 g | Freq: Once | INTRAVENOUS | Status: AC
  Administered 2016-09-11: 1 g via INTRAVENOUS
  Filled 2016-09-11: qty 10

## 2016-09-11 MED ORDER — FENTANYL CITRATE (PF) 100 MCG/2ML IJ SOLN
50.0000 ug | Freq: Once | INTRAMUSCULAR | Status: AC
  Administered 2016-09-11: 50 ug via INTRAVENOUS
  Filled 2016-09-11: qty 2

## 2016-09-11 MED ORDER — CEPHALEXIN 500 MG PO CAPS: 500.0000 mg | ORAL_CAPSULE | Freq: Two times a day (BID) | ORAL | 0 refills | Status: DC

## 2016-09-11 MED ORDER — ONDANSETRON HCL 4 MG/2ML IJ SOLN
4.0000 mg | Freq: Once | INTRAMUSCULAR | Status: AC
  Administered 2016-09-11: 4 mg via INTRAVENOUS
  Filled 2016-09-11: qty 2

## 2016-09-11 MED ORDER — SODIUM CHLORIDE 0.9 % IV BOLUS (SEPSIS)
500.0000 mL | Freq: Once | INTRAVENOUS | Status: AC
  Administered 2016-09-11: 500 mL via INTRAVENOUS

## 2016-09-11 NOTE — ED Triage Notes (Addendum)
Upper abd pain since Thursday, SOB started today. Pt appears SOB. Denies N/V. Pt is a diabetic, family advises very little PO intake and not been taking medications.

## 2016-09-11 NOTE — ED Notes (Signed)
Patient transported to CT 

## 2016-09-11 NOTE — ED Notes (Signed)
Patient in Xray

## 2016-09-11 NOTE — ED Provider Notes (Signed)
[facility] DEPT [facility] Provider Note   CSN: [id number] Arrival date & time: 09/11/16  1342     History   Chief Complaint Chief Complaint  Patient presents with  . Shortness of Breath  . Abdominal Pain    HPI [name] is a 83 y.o. female.  HPI   Pt with hx DM, HTN, CHF, COPD p/w intermittent upper abdominal pain that comes and goes, associated nausea x 4 days.  Improved with drinking half and half.  She developed SOB today.  Has frequent headaches, takes tylenol for this, not in the past 2 days.  Decreased PO intake per family.    Denies fevers, chills, myalgias, CP, cough, vomiting, urinary or bowel changes.  Has never had abdominal surgery.    Past Medical History:  Diagnosis Date  . Abnormality of gait   . Anemia, unspecified   . Anxiety state, unspecified   . Arthritis    BACK , NECK, AND JOINTS  . 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 CHF (congestive heart failure) ([place])   . Chronic cystitis   . Chronic steroid use   . COPD (chronic obstructive pulmonary disease) (HCC)    no inhalers used   . Depressive disorder, not elsewhere classified   . Dermatophytosis of nail   . Diabetic peripheral neuropathy ([place])   . Diaphragmatic hernia without mention of obstruction or gangrene   . Hemoperitoneum (nontraumatic)   . Herpes zoster with other nervous system complications(053.19)   . History of TIA (transient ischemic attack) 2012--  NO RESIDUAL  . HTN (hypertension)   . 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 Insulin dependent diabetes mellitus ([place])   . Macular degeneration of both eyes   . 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 Osteoporosis   . Osteoporosis, unspecified   . Other and unspecified hyperlipidemia   . Personality change due to conditions classified elsewhere   . PONV (postoperative nausea and vomiting)    in past, none recently  . 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 Temporal arteritis  ([place])   . Type I (juvenile type) diabetes mellitus without mention of complication, uncontrolled   . Unspecified essential hypertension   . Unspecified late effects of cerebrovascular disease   . Urinary frequency     Patient Active Problem List   Diagnosis Date Noted  . Atrophic vaginitis 12/11/2013  . UTI (lower urinary tract infection) 11/16/2013  . Acute encephalopathy 11/16/2013  . FTT (failure to thrive) in adult 11/16/2013  . Subdural hematoma ([place]) 11/06/2013  . Fall 11/06/2013  . Hematoma of leg 09/04/2013  . Generalized osteoarthritis of hand 09/04/2013  . Cerumen impaction 09/04/2013  . Rales 01/15/2013  . Chronic kidney disease, stage III (moderate) 12/19/2012  . Sinus congestion 10/24/2012  . Fracture of femoral neck, right, closed ([place]) 08/02/2012  . Hemoperitoneum (nontraumatic) 06/19/2012  . 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 Dysphagia, unspecified(787.20) 06/19/2012  . Urinary frequency 06/19/2012  . Unspecified late effects of cerebrovascular disease 06/19/2012  . Personal history of fall 06/19/2012  . Abnormality of gait 06/19/2012  . Synovial cyst of popliteal space 06/19/2012  . Muscle weakness (generalized) 06/19/2012  . Osteoporosis, unspecified 06/19/2012  . Anxiety state, unspecified 06/19/2012  . Osteoarthrosis, unspecified whether generalized or localized, unspecified site 06/19/2012  . Pain in limb 06/19/2012  . Knee joint replacement by other means 06/19/2012  . Pain in joint, lower leg 06/19/2012  .  Hyperlipidemia 06/19/2012  . Cervicalgia 06/19/2012  . Lack of coordination 06/19/2012  . Pain in joint, shoulder region 06/19/2012  . Dermatophytosis of nail 06/19/2012  . Unspecified urinary incontinence 06/19/2012  . Herpes zoster without mention of complication 27/08/2374  . Unspecified hereditary and idiopathic peripheral neuropathy 06/19/2012  . Osteoarthrosis, unspecified whether generalized or localized, lower leg  06/19/2012  . Lumbago 06/19/2012  . Enthesopathy of hip region 06/19/2012  . Internal hemorrhoids without mention of complication 28/31/5176  . Diaphragmatic hernia without mention of obstruction or gangrene 06/19/2012  . Renal abscess, right 06/11/2012  . Recurrent UTI 06/07/2012  . SIRS (systemic inflammatory response syndrome) ([place]) 06/07/2012  . Retroperitoneal bleed 03/20/2012  . Chronic diastolic congestive heart failure ([place]) 03/20/2012  . 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 Degenerative arthritis of cervical spine 02/09/2011  . Degenerative arthritis of hip 02/09/2011  . Degenerative arthritis of knee 02/09/2011  . Carpal tunnel syndrome of right wrist 02/09/2011  . TIA (transient ischemic attack) 02/09/2011  . Anemia 01/31/2011  . Hypoxia 01/31/2011  . Renal oncocytoma 01/12/2011  . Type II or unspecified type diabetes mellitus with renal manifestations, uncontrolled(250.42) 01/11/2011  . HTN (hypertension) 01/11/2011  . Polymyalgia rheumatica ([place]) 01/11/2011  . Depression 01/11/2011    Past Surgical History:  Procedure Laterality Date  . ANTERIOR CERVICAL DECOMP/DISCECTOMY FUSION  10-12-2005   C5 -- C7  . CARPAL TUNNEL RELEASE Right 2009  (APPROX)  . CATARACT EXTRACTION W/ INTRAOCULAR LENS  IMPLANT, BILATERAL Right Oct 1992   Epes  . CATARACT EXTRACTION W/ INTRAOCULAR LENS IMPLANT Left 1993   Epes  . CRYOABLATION RIGHT RENAL MASS  03-16-2012   ONCOCYTOMA (POST OP 06-09-2012 PERCUTANEOUS DRAIN PLACEMENT FOR PERINEPHRIC ABSCESS)  . CYSTOSCOPY W/ URETERAL STENT PLACEMENT Right 07/10/2012   Procedure: CYSTOSCOPY WITH RIGHT RETROGRADE PYELOGRAM/URETERAL STENT PLACEMENT;  Surgeon: Malka So, MD;  Location[facility];  Service: Urology;  Laterality: Right;  . ELBOW SURGERY Bilateral 2003  (APPROX)  . HIP ARTHROPLASTY Right 08/03/2012   Procedure: CEMENTED MONOPOLAR HIP;  Surgeon: Marybelle Killings, MD;  Location: [facility];  Service: Orthopedics;   Laterality: Right;  . LUMBAR LAMINECTOMY  11-19-2003   L3 -- L4  . ORIF W/ PLATE RIGHT DISTAL RADIUS FX  06-18-2003  . POSTERIOR FUSION CERVICAL SPINE  02-10-2006   C5 -- C7  . TEMPORAL ARTERY BIOPSY / LIGATION    . TOTAL HIP ARTHROPLASTY Left 01-22-2005  . TOTAL KNEE ARTHROPLASTY Bilateral LEFT 08-08-2007;  RIGHT 05-08-2009    OB History    No data available       Home Medications    Prior to Admission medications   Medication Sig Start Date End Date Taking? Authorizing Provider  acyclovir (ZOVIRAX) 200 MG capsule TAKE ONE CAPSULE BY MOUTH ONCE DAILY **TO  PREVENT  RELAPSE  OF  HERPES  SIMPLEX** 10/27/14   Estill Dooms, MD  aspirin EC 325 MG tablet Take 1 tablet (325 mg total) by mouth daily. 01/02/14   Lauree Chandler, NP  cephALEXin (KEFLEX) 500 MG capsule Take 1 capsule (500 mg total) by mouth 2 (two) times daily. 09/11/16   Clayton Bibles, PA-C  conjugated estrogens (PREMARIN) vaginal cream Place 1 Applicatorful vaginally daily. 01/02/14   Lauree Chandler, NP  Cranberry 475 MG CAPS Take 1-2 capsules by mouth 2 (two) times daily. Take 1 capsule every morning and 2 capsules every night.    [provider]  feeding supplement, [place], ([place]) LIQD  Take 237 mLs by mouth 2 (two) times daily between meals. 01/02/14   Lauree Chandler, NP  furosemide (LASIX) 20 MG tablet Take 1 tablet (20 mg total) by mouth daily. 01/02/14   Lauree Chandler, NP  gabapentin (NEURONTIN) 300 MG capsule Take 1 capsule (300 mg total) by mouth 3 (three) times daily. Taking 1 tab in the am , and 2 tabs in the pm. 01/02/14   Lauree Chandler, NP  linagliptin (TRADJENTA) 5 MG TABS tablet Take 1 tablet (5 mg total) by mouth daily. 01/02/14   Lauree Chandler, NP  meloxicam (MOBIC) 15 MG tablet Take 1 tablet (15 mg total) by mouth daily. 01/02/14   Lauree Chandler, NP  metoprolol tartrate (LOPRESSOR) 25 MG tablet Take 0.5 tablets (12.5 mg total) by mouth 3 (three) times a  week. On Monday, Wednesday and Friday 01/02/14   Lauree Chandler, NP  Multiple Vitamin (MULTIVITAMIN WITH MINERALS) TABS Take 1 tablet by mouth daily.    [provider]  oxybutynin (DITROPAN) 5 MG tablet Take 1 tablet (5 mg total) by mouth 3 (three) times daily as needed for bladder spasms (Up to 3 times daily to control bladder spasms.). 01/02/14   Lauree Chandler, NP  phenazopyridine (PYRIDIUM) 100 MG tablet Take 1 tablet (100 mg total) by mouth 3 (three) times daily as needed for pain. 12/11/13   Blanchie Serve, MD  predniSONE (DELTASONE) 1 MG tablet TAKE THREE TABLETS BY MOUTH ONCE DAILY IN THE MORNING 04/22/14   Estill Dooms, MD  ranitidine (ZANTAC) 150 MG tablet Take 1 tablet (150 mg total) by mouth 2 (two) times daily. 09/11/16   Clayton Bibles, PA-C  saccharomyces boulardii (FLORASTOR) 250 MG capsule Take 1 capsule (250 mg total) by mouth 2 (two) times daily. 12/16/13   Blanchie Serve, MD    Family History Family History  Problem Relation Age of Onset  . Cancer Mother   . Cancer Father   . Cancer Brother   . Cancer Daughter   . Leukemia Unknown     Social History Social History  Substance Use Topics  . Smoking status: Former Smoker    Packs/day: 2.00    Years: 30.00    Types: Cigarettes    Quit date: 01/11/1983  . Smokeless tobacco: Never Used  . Alcohol use No     Allergies   Allegra [fexofenadine hcl]; Levaquin [levofloxacin in d5w]; Tussionex pennkinetic er [employer] er]; Codeine; and Morphine and related   Review of Systems Review of Systems  All other systems reviewed and are negative.    Physical Exam Updated Vital Signs BP 131/71   Pulse 81   Temp 97.9 F (36.6 C) (Oral)   Resp 16   SpO2 97%   Physical Exam  Constitutional: She appears well-developed and well-nourished. No distress.  HENT:  Head: Normocephalic and atraumatic.  Neck: Neck supple.  Cardiovascular: Normal rate and regular rhythm.   Pulmonary/Chest: Effort  normal and breath sounds normal. Tachypnea noted. No respiratory distress. She has no decreased breath sounds. She has no wheezes. She has no rhonchi. She has no rales.  Abdominal: Soft. She exhibits no distension. There is tenderness (epigastric). There is no rebound and no guarding.  Neurological: She is alert.  Skin: She is not diaphoretic.  Nursing note and vitals reviewed.    ED Treatments / Results  Labs (all labs ordered are listed, but only abnormal results are displayed) Labs Reviewed  CBC - Abnormal; Notable for  the following:       Result Value   Platelets 104 (*)    All other components within normal limits  COMPREHENSIVE METABOLIC PANEL - Abnormal; Notable for the following:    Glucose, Bld 182 (*)    BUN 47 (*)    Creatinine, Ser 1.24 (*)    GFR calc non Af Amer 37 (*)    GFR calc Af Amer 43 (*)    All other components within normal limits  URINALYSIS, ROUTINE W REFLEX MICROSCOPIC - Abnormal; Notable for the following:    APPearance CLOUDY (*)    Hgb urine dipstick TRACE (*)    Nitrite POSITIVE (*)    Leukocytes, UA LARGE (*)    All other components within normal limits  URINALYSIS, MICROSCOPIC (REFLEX) - Abnormal; Notable for the following:    Bacteria, UA MANY (*)    Squamous Epithelial / LPF 0-5 (*)    All other components within normal limits  CBG MONITORING, ED - Abnormal; Notable for the following:    Glucose-Capillary 180 (*)    All other components within normal limits  URINE CULTURE  LIPASE, BLOOD  TROPONIN I    EKG  EKG Interpretation  Date/Time:  Sunday September 11 2016 14:08:28 EDT Ventricular Rate:  86 PR Interval:    QRS Duration: 95 QT Interval:  377 QTC Calculation: 451 R Axis:   47 Text Interpretation:  Sinus or ectopic atrial rhythm Multiple premature complexes, vent & supraven No significant change since last tracing Confirmed by Alfonzo Beers [id number]) on 09/11/2016 3:14:02 PM       Radiology Dg Chest 2 View  Result Date:  09/11/2016 CLINICAL DATA:  Shortness of breath and upper abdominal pain. EXAM: CHEST  2 VIEW COMPARISON:  None. FINDINGS: The heart size is normal. There is stable tortuosity of the thoracic aorta. Mild scarring/atelectasis present at both lung bases. There is no evidence of pulmonary edema, consolidation, pneumothorax, nodule or pleural fluid. Lateral view shows methylmethacrylate in a mid and lower thoracic vertebral body related to prior vertebral augmentation. There also are some other mild osteopenic compression fractures present in the thoracic spine. IMPRESSION: Bibasilar scarring/ atelectasis without acute finding. Evidence of prior vertebral augmentation and other thoracic osteopenic compression fractures. Electronically Signed   By: Aletta Edouard M.D.   On: 09/11/2016 14:21   Ct Head Wo Contrast  Result Date: 09/11/2016 CLINICAL DATA:  Headache.  History of TIA and hypertension. EXAM: CT HEAD WITHOUT CONTRAST TECHNIQUE: Contiguous axial images were obtained from the base of the skull through the vertex without intravenous contrast. COMPARISON:  12/23/2013 FINDINGS: Brain: Expected cerebral volume loss for age. Relatively mild for age low density in the periventricular white matter likely related to small vessel disease. No mass lesion, hemorrhage, hydrocephalus, acute infarct, intra-axial, or extra-axial fluid collection. Vascular: Intracranial atherosclerosis. Skull: No skull fracture. Sinuses/Orbits: Hyperdense material nearly filling the left maxillary sinus. Other paranasal sinuses and mastoid air cells clear. Other: None. IMPRESSION: 1. No acute intracranial abnormality. Cerebral atrophy and small vessel ischemic change. 2. Near complete filling of the left maxillary sinus with hyperattenuating material. Most likely inspissated secretions. Electronically Signed   By: Abigail Miyamoto M.D.   On: 09/11/2016 15:51   Ct Angio Chest Pe W/cm &/or Wo Cm  Result Date: 09/11/2016 : CT angiogram chest and CT  abdomen and pelvis reports are combined into a single dictation. Electronically Signed   By: Lowella Grip III M.D.   On: 09/11/2016 16:11   Ct  Abdomen Pelvis W Contrast  Result Date: 09/11/2016 CLINICAL DATA:  Shortness of breath.  Nausea. EXAM: CT ANGIOGRAPHY CHEST CT ABDOMEN AND PELVIS WITH CONTRAST TECHNIQUE: Multidetector CT imaging of the chest was performed using the standard protocol during bolus administration of intravenous contrast. Multiplanar CT image reconstructions and MIPs were obtained to evaluate the vascular anatomy. Multidetector CT imaging of the abdomen and pelvis was performed using the standard protocol during bolus administration of intravenous contrast. CONTRAST:  75 mL ISOVUE-300 IOPAMIDOL (ISOVUE-300) INJECTION 61% COMPARISON:  CT abdomen and pelvis November 06, 2013 FINDINGS: CTA CHEST FINDINGS Cardiovascular: There is no demonstrable type necklace. There is no appreciable thoracic aortic aneurysm. No dissection is seen. The contrast bolus in the thoracic aorta is less than optimal for assessment section spur. There is apparent origin of the right subclavian artery with a right subclavian artery passing posterior to the upper thoracic esophagus from the aortic arch to the right side. There are scattered foci of great vessel calcification. There is atherosclerotic calcification in the aorta. There are foci of coronary artery calcification. Note that the thoracic aorta is tortuous. Pericardium is not appreciably thickened. Mediastinum/Nodes: Visualized thyroid appears unremarkable. There is no appreciable thoracic adenopathy. Lungs/Pleura: There is a 3 mm nodular opacity in the medial segment right middle lobe. There are foci of atelectatic change in both lower lobes and right middle lobe. There is no lung edema or consolidation. There is no appreciable pleural thickening or effusion. Musculoskeletal: Patient has had previous kyphoplasty procedures at T6 and T12. There is anterior  wedging at T4 and T8. Bones are diffusely osteoporotic. No lytic or destructive lesions identified. No blastic bone lesion. There is postoperative change in the lower cervical region. Review of the MIP images confirms the above findings. CT ABDOMEN and PELVIS FINDINGS Hepatobiliary: The liver as a somewhat nodular component consistent with hepatic cirrhosis. Liver measures 18 cm in length. There are probable calcified granulomas along the fluid, also present on prior study. There is sludge and gallstones within the gallbladder. The gallbladder wall does not appear appreciably thickened. The biliary duct dilatation. Pancreas: No pancreatic mass or inflammatory focus. Spleen: Tiny foci of decreased attenuation in the spleen are stable, likely representing gamma Gandy bodies. Spleen otherwise appears unremarkable. Spleen is normal in size. Adrenals/Urinary Tract: Adrenals appear normal bilaterally. There is scarring in the posterior right kidney. The patient has undergone cryoablation for a right renal mass posteriorly. The residual mass in this area measures 1.0 x 1.1 cm, similar in size with left scarring compared previous study. Noted new lesion is evident in this region. On the left, there are parapelvic cysts as well as a degree of hydronephrosis. There is a cyst arising from the lateral upper pole the left kidney measuring 2.1 x 2.1 cm. There is no evident renal or ureteral calculus on either side. Urinary bladder is felt fluid is susceptibility artifact from bilateral total hip prostheses. Stomach/Bowel: There are multiple sigmoid diverticula without diverticulitis. Scattered diverticular noted elsewhere in the colon. There is no bowel obstruction. No free air or portal venous air. Vascular/Lymphatic: There is atherosclerotic calcification in a tortuous aorta. There is also iliac artery calcification. No aneurysm. Major mesenteric vessels appear patent. No adenopathy in the abdomen or pelvis. Reproductive:  Artifact from total hip replacements bilaterally limits pelvic visualization. There is no obvious pelvic mass. Other: No abscess or ascites evident in the abdomen or pelvis. Appendix appears normal. Musculoskeletal: Total hip replacements bilaterally. Extensive degenerative type changes noted in the lumbar  spine. There are no blastic or lytic lesions. There is upper lumbar dextroscoliosis. No intramuscular or abdominal wall lesion evident. Review of the MIP images confirms the above findings. IMPRESSION: CT angiogram chest: 1.  No demonstrable pulmonary embolus. 2. No demonstrable thoracic aortic aneurysm. No dissection seen, although it must be noted that the contrast bolus is limited with respect to assessment for dissection. There are foci of atherosclerotic calcification in the aorta and great vessels as well as foci of coronary artery calcification. Aorta is tortuous. 3. Aberrant origin of the right subclavian artery. Right subclavian artery passes posterior to the upper thoracic esophagus to pass from the aortic arch to right side. This finding is an anatomic variant. 4. 3 mm nodular opacity right middle lobe medially. No follow-up needed if patient is low-risk. Non-contrast chest CT can be considered in 12 months if patient is high-risk. This recommendation follows the consensus statement: Guidelines for Management of Incidental Pulmonary Nodules Detected on CT Images: From the Fleischner Society 2017; Radiology 2017; 284:228-243. 5. No parenchymal lung edema or consolidation. No pleural effusion. 6.  No appreciable thoracic adenopathy. 7. Multiple compression fractures in the thoracic region. Status post kyphoplasty procedures at T6 and T12. Aortic Atherosclerosis (ICD10-I70.0). CT abdomen and pelvis: 1. Evidence of hepatic cirrhosis. Liver prominent without focal liver lesion evident. 2. Previous cryoablation of right renal lesion without progression of this lesion. No new renal mass. 3. There appears to be  obstruction at the left ureteropelvic junction without mass or calculus evident. Suspect a degree of left ureteropelvic junction obstruction. There are also parapelvic cysts on the left. Patient may benefit from nonemergent nuclear medicine Lasix renogram to further evaluate this area. No renal or ureteral calculi evident. 4. Sludge and gallstones within gallbladder. Gallbladder wall does not appear thickened. 5. Multiple colonic diverticula without diverticulitis. No bowel obstruction. No abscess. 6. Limited pelvic visualization due to susceptibility artifact from total hip prostheses bilaterally. In particular, the urinary bladder cannot be visualized due to the extensive artifact. 7.  Aortoiliac atherosclerosis.  Aorta tortuous. 8.  Advanced arthropathy in the lumbar spine. Electronically Signed   By: Lowella Grip III M.D.   On: 09/11/2016 16:11    Procedures Procedures (including critical care time)  Medications Ordered in ED Medications  cefTRIAXone (ROCEPHIN) 1 g in dextrose 5 % 50 mL IVPB (1 g Intravenous New Bag/Given 09/11/16 1644)  sodium chloride 0.9 % bolus 500 mL (0 mLs Intravenous Stopped 09/11/16 1642)  pantoprazole (PROTONIX) injection 40 mg (40 mg Intravenous Given 09/11/16 1557)  ondansetron (ZOFRAN) injection 4 mg (4 mg Intravenous Given 09/11/16 1556)  fentaNYL (SUBLIMAZE) injection 50 mcg (50 mcg Intravenous Given 09/11/16 1556)  iopamidol (ISOVUE-370) 76 % injection 100 mL (75 mLs Intravenous Contrast Given 09/11/16 1524)  sodium chloride 0.9 % bolus 500 mL (500 mLs Intravenous New Bag/Given 09/11/16 1644)     Initial Impression / Assessment and Plan / ED Course  I have reviewed the triage vital signs and the nursing notes.  Pertinent labs & imaging results that were available during my care of the patient were reviewed by me and considered in my medical decision making (see chart for details).  Clinical Course as of Sep 11 1700  Nancy Fetter Sep 11, 2016  1639 Discussed all results with  patient and family. Offered admission.  Pt wants to go home and do treatment at home, family is in agreement.  Pt has PCP follow up appointment this week.    [EW]    Clinical  Course User Index [EW] [place], Sherill Mangen, [place]    Afebrile, nontoxic patient with upper abdominal pain x several days, SOB just today.  SOB resolved once pain was controlled.  Discussed pt with Dr Canary Brim.  Labs/UA significant for UTI.  Renal function is worsened since 2015 but this is likely chronic, unable to say for sure given lack of records from [place] where she has been receiving care.  CTs demonstrate e/o cirrhosis, cholelithiasis, left ureteral obstruction, lung nodule.  Pt is remote smoker.  Discussed all findings with patient and family.  Admission for treatment offered to the patient but she declined.  IVF given.  Rocephin given for UTI.   D/C home with keflex, zantac, gallbladder diet (pt had known stones), close PCP follow up, urology follow up for ureteral obstruction.  Discussed result, findings, treatment, and follow up  with patient.  Pt given return precautions.  Pt verbalizes understanding and agrees with plan.       Final Clinical Impressions(s) / ED Diagnoses   Final diagnoses:  Epigastric pain  Calculus of gallbladder without cholecystitis without obstruction  Ureteral obstruction, left  Lower urinary tract infectious disease    New Prescriptions New Prescriptions   CEPHALEXIN (KEFLEX) 500 MG CAPSULE    Take 1 capsule (500 mg total) by mouth 2 (two) times daily.   RANITIDINE (ZANTAC) 150 MG TABLET    Take 1 tablet (150 mg total) by mouth 2 (two) times daily.     Clayton Bibles, PA-C 09/11/16 [address], PA-C 09/11/16 1706    Alfonzo Beers, MD 09/15/16 [phone]

## 2016-09-11 NOTE — Discharge Instructions (Signed)
Read the information below.  Use the prescribed medication as directed.  Please discuss all new medications with your pharmacist.  You may return to the Emergency Department at any time for worsening condition or any new symptoms that concern you.   If you develop high fevers, worsening abdominal pain, uncontrolled vomiting, or are unable to tolerate fluids by mouth, return to the ER for a recheck.  ° °

## 2016-09-14 LAB — URINE CULTURE

## 2016-09-15 ENCOUNTER — Telehealth: Payer: Self-pay | Admitting: Emergency Medicine

## 2016-09-15 NOTE — Telephone Encounter (Signed)
Post ED Visit - Positive Culture Follow-up: Successful Patient Follow-Up  Culture assessed and recommendations reviewed by: []  Elenor Quinones, Pharm.D. []  Heide Guile, Pharm.D., BCPS AQ-ID []  [place]ks Neptune, Pharm.D., BCPS [x]  Alycia Rossetti, Pharm.D., BCPS []  [place], Pharm.D., BCPS, AAHIVP []  Legrand Como, Pharm.D., BCPS, AAHIVP []  Salome Arnt, PharmD, BCPS []  Dimitri Ped, PharmD, BCPS []  Vincenza Hews, PharmD, BCPS  Positive urine culture  []  Patient discharged without antimicrobial prescription and treatment is now indicated [x]  Organism is resistant to prescribed ED discharge antimicrobial []  Patient with positive blood cultures  Changes discussed with ED provider: Clayton Bibles PA New antibiotic prescription start Fosfomycin 3 grams po x 1 dose  Attempting to contact patient   Hazle Nordmann 09/15/2016, 4:09 PM

## 2016-09-15 NOTE — Progress Notes (Signed)
ED Antimicrobial Stewardship Positive Culture Follow Up   [name] is an 83 y.o. female who presented to [facility] on 09/11/2016 with a chief complaint of  Chief Complaint  Patient presents with  . Shortness of Breath  . Abdominal Pain    Recent Results (from the past 720 hour(s))  Urine culture     Status: Abnormal   Collection Time: 09/11/16  3:19 PM  Result Value Ref Range Status   Specimen Description URINE, CLEAN CATCH  Final   Special Requests NONE  Final   Culture >=100,000 COLONIES/mL ESCHERICHIA COLI (A)  Final   Report Status 09/14/2016 FINAL  Final   Organism ID, Bacteria ESCHERICHIA COLI (A)  Final      Susceptibility   Escherichia coli - MIC*    AMPICILLIN >=32 RESISTANT Resistant     CEFAZOLIN >=64 RESISTANT Resistant     CEFTRIAXONE <=1 SENSITIVE Sensitive     CIPROFLOXACIN <=0.25 SENSITIVE Sensitive     GENTAMICIN <=1 SENSITIVE Sensitive     IMIPENEM <=0.25 SENSITIVE Sensitive     NITROFURANTOIN <=16 SENSITIVE Sensitive     TRIMETH/SULFA >=320 RESISTANT Resistant     AMPICILLIN/SULBACTAM >=32 RESISTANT Resistant     PIP/TAZO 8 SENSITIVE Sensitive     Extended ESBL NEGATIVE Sensitive     * >=100,000 COLONIES/mL ESCHERICHIA COLI    [x]  Treated with Keflex, organism resistant to prescribed antimicrobial  New antibiotic prescription: Fosfomycin 3g po x 1 dose  ED Provider: Clayton Bibles, PA-C  Lawson Radar 09/15/2016, 10:51 AM Infectious Diseases Pharmacist Phone# [phone]

## 2016-10-30 ENCOUNTER — Emergency Department (HOSPITAL_BASED_OUTPATIENT_CLINIC_OR_DEPARTMENT_OTHER)
Admission: EM | Admit: 2016-10-30 | Discharge: 2016-10-31 | Disposition: A | Discharge status: Home / Self Care | Payer: Medicare Other | Attending: Emergency Medicine | Admitting: Emergency Medicine

## 2016-10-30 ENCOUNTER — Emergency Department (HOSPITAL_BASED_OUTPATIENT_CLINIC_OR_DEPARTMENT_OTHER): Payer: Medicare Other

## 2016-10-30 ENCOUNTER — Encounter (HOSPITAL_BASED_OUTPATIENT_CLINIC_OR_DEPARTMENT_OTHER): Payer: Self-pay | Admitting: Emergency Medicine

## 2016-10-30 DIAGNOSIS — E1122 Type 2 diabetes mellitus with diabetic chronic kidney disease: Secondary | ICD-10-CM | POA: Insufficient documentation

## 2016-10-30 DIAGNOSIS — R110 Nausea: Secondary | ICD-10-CM | POA: Diagnosis not present

## 2016-10-30 DIAGNOSIS — N3000 Acute cystitis without hematuria: Secondary | ICD-10-CM | POA: Insufficient documentation

## 2016-10-30 DIAGNOSIS — R1010 Upper abdominal pain, unspecified: Secondary | ICD-10-CM | POA: Diagnosis present

## 2016-10-30 DIAGNOSIS — Z79899 Other long term (current) drug therapy: Secondary | ICD-10-CM | POA: Insufficient documentation

## 2016-10-30 DIAGNOSIS — I130 Hypertensive heart and chronic kidney disease with heart failure and stage 1 through stage 4 chronic kidney disease, or unspecified chronic kidney disease: Secondary | ICD-10-CM | POA: Diagnosis not present

## 2016-10-30 DIAGNOSIS — J449 Chronic obstructive pulmonary disease, unspecified: Secondary | ICD-10-CM | POA: Diagnosis not present

## 2016-10-30 DIAGNOSIS — I5032 Chronic diastolic (congestive) heart failure: Secondary | ICD-10-CM | POA: Insufficient documentation

## 2016-10-30 DIAGNOSIS — Z8673 Personal history of transient ischemic attack (TIA), and cerebral infarction without residual deficits: Secondary | ICD-10-CM | POA: Diagnosis not present

## 2016-10-30 DIAGNOSIS — Z87891 Personal history of nicotine dependence: Secondary | ICD-10-CM | POA: Insufficient documentation

## 2016-10-30 DIAGNOSIS — N183 Chronic kidney disease, stage 3 (moderate): Secondary | ICD-10-CM | POA: Insufficient documentation

## 2016-10-30 DIAGNOSIS — I129 Hypertensive chronic kidney disease with stage 1 through stage 4 chronic kidney disease, or unspecified chronic kidney disease: Secondary | ICD-10-CM | POA: Diagnosis not present

## 2016-10-30 DIAGNOSIS — Z7982 Long term (current) use of aspirin: Secondary | ICD-10-CM | POA: Insufficient documentation

## 2016-10-30 DIAGNOSIS — Z7984 Long term (current) use of oral hypoglycemic drugs: Secondary | ICD-10-CM | POA: Insufficient documentation

## 2016-10-30 LAB — COMPREHENSIVE METABOLIC PANEL
ALT: 19 U/L (ref 14–54)
AST: 23 U/L (ref 15–41)
Albumin: 4.1 g/dL (ref 3.5–5.0)
Alkaline Phosphatase: 51 U/L (ref 38–126)
Anion gap: 10 (ref 5–15)
BILIRUBIN TOTAL: 0.9 mg/dL (ref 0.3–1.2)
BUN: 35 mg/dL — AB (ref 6–20)
CO2: 23 mmol/L (ref 22–32)
CREATININE: 1.14 mg/dL — AB (ref 0.44–1.00)
Calcium: 9.6 mg/dL (ref 8.9–10.3)
Chloride: 107 mmol/L (ref 101–111)
GFR calc Af Amer: 48 mL/min — ABNORMAL LOW (ref >60)
GFR, EST NON AFRICAN AMERICAN: 41 mL/min — AB (ref >60)
Glucose, Bld: 108 mg/dL — ABNORMAL HIGH (ref 65–99)
POTASSIUM: 4.5 mmol/L (ref 3.5–5.1)
Sodium: 140 mmol/L (ref 135–145)
TOTAL PROTEIN: 7.5 g/dL (ref 6.5–8.1)

## 2016-10-30 LAB — CBC
HEMATOCRIT: 39.6 % (ref 36.0–46.0)
Hemoglobin: 13.1 g/dL (ref 12.0–15.0)
MCH: 31.7 pg (ref 26.0–34.0)
MCHC: 33.1 g/dL (ref 30.0–36.0)
MCV: 95.9 fL (ref 78.0–100.0)
Platelets: 107 10*3/uL — ABNORMAL LOW (ref 150–400)
RBC: 4.13 MIL/uL (ref 3.87–5.11)
RDW: 14.7 % (ref 11.5–15.5)
WBC: 8.8 10*3/uL (ref 4.0–10.5)

## 2016-10-30 LAB — ACETAMINOPHEN LEVEL: Acetaminophen (Tylenol), Serum: 13 ug/mL (ref 10–30)

## 2016-10-30 LAB — URINALYSIS, ROUTINE W REFLEX MICROSCOPIC
Bilirubin Urine: NEGATIVE
GLUCOSE, UA: NEGATIVE mg/dL
KETONES UR: 15 mg/dL — AB
NITRITE: POSITIVE — AB
PH: 5 (ref 5.0–8.0)
Protein, ur: 30 mg/dL — AB
SPECIFIC GRAVITY, URINE: 1.023 (ref 1.005–1.030)

## 2016-10-30 LAB — URINALYSIS, MICROSCOPIC (REFLEX): RBC / HPF: NONE SEEN RBC/hpf (ref 0–5)

## 2016-10-30 LAB — BRAIN NATRIURETIC PEPTIDE: B NATRIURETIC PEPTIDE 5: 395 pg/mL — AB (ref 0.0–100.0)

## 2016-10-30 LAB — LIPASE, BLOOD: LIPASE: 29 U/L (ref 11–51)

## 2016-10-30 MED ORDER — OMEPRAZOLE 20 MG PO CPDR: 20.0000 mg | DELAYED_RELEASE_CAPSULE | Freq: Every day | ORAL | 0 refills | Status: DC

## 2016-10-30 MED ORDER — NITROFURANTOIN MONOHYD MACRO 100 MG PO CAPS: 100.0000 mg | ORAL_CAPSULE | Freq: Two times a day (BID) | ORAL | 0 refills | Status: DC

## 2016-10-30 MED ORDER — NITROFURANTOIN MONOHYD MACRO 100 MG PO CAPS
100.0000 mg | ORAL_CAPSULE | Freq: Once | ORAL | Status: AC
  Administered 2016-10-30: 100 mg via ORAL
  Filled 2016-10-30: qty 1

## 2016-10-30 NOTE — ED Triage Notes (Signed)
Patient reports mid upper abdominal pain which began 2 days ago.  Denies nausea, vomiting.  Reports shortness of breath.

## 2016-10-30 NOTE — ED Notes (Signed)
Patient reevaluated in the waiting room. Sitting up with family. No distress noted

## 2016-10-30 NOTE — Discharge Instructions (Signed)
1. TAKE MACROBID FOR URINARY TRACT INFECTION. 2. STOP TAKING ZANTAC AND START TAKING OMEPRAZOLE. AVOID SPICY/GREASY FOODS, TOMATOES, AND CITRUS FOODS. 3. FOLLOW UP WITH PRIMARY CARE PROVIDER FOR RE-EVALUATION. YOU MAY NEED AN ULTRASOUND OF YOUR GALLBLADDER OR EVALUATION BY A GASTROENTEROLOGIST. 4. RETURN TO ER IF ANY VOMITING, FEVER, WORSENING PAIN, OR BLOODY STOOLS.

## 2016-10-30 NOTE — ED Provider Notes (Signed)
[facility] Provider Note   CSN: [id number] Arrival date & time: 10/30/16  1742     History   Chief Complaint Chief Complaint  Patient presents with  . Abdominal Pain    HPI [name] is a 83 y.o. female.  83yo F w/ PMH including CHF, COPD, TIA, HTN, thrombocytopenia who p/w abdominal pain. She has had intermittent upper abdominal pain for a long time, was evaluated in July for the same sx and discharged. She has not yet followed up with PCP for sx but has upcoming appt. She reports pain is intermittent, non-radiating upper abdominal pain that is worse after eating, associated with nausea. Pain seems mid-epigastric to LUQ. She reports some SOB which she also reported at last evaluation, no chest pain or leg swelling. No vomiting, diarrhea, fevers, cough/cold sx, or urinary symptoms. They are aware of gallstones but have never seen general surgeon or specialist for this problem.     Abdominal Pain      Past Medical History:  Diagnosis Date  . Abnormality of gait   . Anemia, unspecified   . Anxiety state, unspecified   . Arthritis    BACK , NECK, AND JOINTS  . 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 CHF (congestive heart failure) ([place])   . Chronic cystitis   . Chronic steroid use   . COPD (chronic obstructive pulmonary disease) (H[facility])    no inhalers used   . Depressive disorder, not elsewhere classified   . Dermatophytosis of nail   . Diabetic peripheral neuropathy ([place])   . Diaphragmatic hernia without mention of obstruction or gangrene   . Hemoperitoneum (nontraumatic)   . Herpes zoster with other nervous system complications(053.19)   . History of TIA (transient ischemic attack) 2012--  NO RESIDUAL  . HTN (hypertension)   . 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 Insulin dependent diabetes mellitus ([place])   . Macular degeneration of both eyes   . 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 Osteoporosis   . Osteoporosis, unspecified   .  Other and unspecified hyperlipidemia   . Personality change due to conditions classified elsewhere   . PONV (postoperative nausea and vomiting)    in past, none recently  . 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 Temporal arteritis ([place])   . Type I (juvenile type) diabetes mellitus without mention of complication, uncontrolled   . Unspecified essential hypertension   . Unspecified late effects of cerebrovascular disease   . Urinary frequency     Patient Active Problem List   Diagnosis Date Noted  . Atrophic vaginitis 12/11/2013  . UTI (lower urinary tract infection) 11/16/2013  . Acute encephalopathy 11/16/2013  . FTT (failure to thrive) in adult 11/16/2013  . Subdural hematoma ([place]) 11/06/2013  . Fall 11/06/2013  . Hematoma of leg 09/04/2013  . Generalized osteoarthritis of hand 09/04/2013  . Cerumen impaction 09/04/2013  . Rales 01/15/2013  . Chronic kidney disease, stage III (moderate) 12/19/2012  . Sinus congestion 10/24/2012  . Fracture of femoral neck, right, closed ([place]) 08/02/2012  . Hemoperitoneum (nontraumatic) 06/19/2012  . 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 Dysphagia, unspecified(787.20) 06/19/2012  . Urinary frequency 06/19/2012  . Unspecified late effects of cerebrovascular disease 06/19/2012  . Personal history of fall 06/19/2012  . Abnormality of gait 06/19/2012  . Synovial cyst of popliteal space 06/19/2012  . Muscle weakness (generalized) 06/19/2012  . Osteoporosis, unspecified 06/19/2012  .  Anxiety state, unspecified 06/19/2012  . Osteoarthrosis, unspecified whether generalized or localized, unspecified site 06/19/2012  . Pain in limb 06/19/2012  . Knee joint replacement by other means 06/19/2012  . Pain in joint, lower leg 06/19/2012  . Hyperlipidemia 06/19/2012  . Cervicalgia 06/19/2012  . Lack of coordination 06/19/2012  . Pain in joint, shoulder region 06/19/2012  . Dermatophytosis of nail  06/19/2012  . Unspecified urinary incontinence 06/19/2012  . Herpes zoster without mention of complication 69/62/9528  . Unspecified hereditary and idiopathic peripheral neuropathy 06/19/2012  . Osteoarthrosis, unspecified whether generalized or localized, lower leg 06/19/2012  . Lumbago 06/19/2012  . Enthesopathy of hip region 06/19/2012  . Internal hemorrhoids without mention of complication 41/32/4401  . Diaphragmatic hernia without mention of obstruction or gangrene 06/19/2012  . Renal abscess, right 06/11/2012  . Recurrent UTI 06/07/2012  . SIRS (systemic inflammatory response syndrome) ([place]) 06/07/2012  . Retroperitoneal bleed 03/20/2012  . Chronic diastolic congestive heart failure ([place]) 03/20/2012  . 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 Degenerative arthritis of cervical spine 02/09/2011  . Degenerative arthritis of hip 02/09/2011  . Degenerative arthritis of knee 02/09/2011  . Carpal tunnel syndrome of right wrist 02/09/2011  . TIA (transient ischemic attack) 02/09/2011  . Anemia 01/31/2011  . Hypoxia 01/31/2011  . Renal oncocytoma 01/12/2011  . Type II or unspecified type diabetes mellitus with renal manifestations, uncontrolled(250.42) 01/11/2011  . HTN (hypertension) 01/11/2011  . Polymyalgia rheumatica ([place]) 01/11/2011  . Depression 01/11/2011    Past Surgical History:  Procedure Laterality Date  . ANTERIOR CERVICAL DECOMP/DISCECTOMY FUSION  10-12-2005   C5 -- C7  . CARPAL TUNNEL RELEASE Right 2009  (APPROX)  . CATARACT EXTRACTION W/ INTRAOCULAR LENS  IMPLANT, BILATERAL Right Oct 1992   Epes  . CATARACT EXTRACTION W/ INTRAOCULAR LENS IMPLANT Left 1993   Epes  . CRYOABLATION RIGHT RENAL MASS  03-16-2012   ONCOCYTOMA (POST OP 06-09-2012 PERCUTANEOUS DRAIN PLACEMENT FOR PERINEPHRIC ABSCESS)  . CYSTOSCOPY W/ URETERAL STENT PLACEMENT Right 07/10/2012   Procedure: CYSTOSCOPY WITH RIGHT RETROGRADE PYELOGRAM/URETERAL STENT PLACEMENT;  Surgeon: Malka So, MD;   Location: [facility];  Service: Urology;  Laterality: Right;  . ELBOW SURGERY Bilateral 2003  (APPROX)  . HIP ARTHROPLASTY Right 08/03/2012   Procedure: CEMENTED MONOPOLAR HIP;  Surgeon: Marybelle Killings, MD;  Location: [facility];  Service: Orthopedics;  Laterality: Right;  . LUMBAR LAMINECTOMY  11-19-2003   L3 -- L4  . ORIF W/ PLATE RIGHT DISTAL RADIUS FX  06-18-2003  . POSTERIOR FUSION CERVICAL SPINE  02-10-2006   C5 -- C7  . TEMPORAL ARTERY BIOPSY / LIGATION    . TOTAL HIP ARTHROPLASTY Left 01-22-2005  . TOTAL KNEE ARTHROPLASTY Bilateral LEFT 08-08-2007;  RIGHT 05-08-2009    OB History    No data available       Home Medications    Prior to Admission medications   Medication Sig Start Date End Date Taking? Authorizing Provider  acyclovir (ZOVIRAX) 200 MG capsule TAKE ONE CAPSULE BY MOUTH ONCE DAILY **TO  PREVENT  RELAPSE  OF  HERPES  SIMPLEX** 10/27/14   Estill Dooms, MD  aspirin EC 325 MG tablet Take 1 tablet (325 mg total) by mouth daily. 01/02/14   Lauree Chandler, NP  cephALEXin (KEFLEX) 500 MG capsule Take 1 capsule (500 mg total) by mouth 2 (two) times daily. 09/11/16   Clayton Bibles, PA-C  conjugated estrogens (PREMARIN) vaginal cream Place 1 Applicatorful vaginally daily. 01/02/14   Sherrie Mustache  K, NP  Cranberry 475 MG CAPS Take 1-2 capsules by mouth 2 (two) times daily. Take 1 capsule every morning and 2 capsules every night.    [provider]  feeding supplement, GLUCERNA SHAKE, (GLUCERNA SHAKE) LIQD Take 237 mLs by mouth 2 (two) times daily between meals. 01/02/14   Lauree Chandler, NP  furosemide (LASIX) 20 MG tablet Take 1 tablet (20 mg total) by mouth daily. 01/02/14   Lauree Chandler, NP  gabapentin (NEURONTIN) 300 MG capsule Take 1 capsule (300 mg total) by mouth 3 (three) times daily. Taking 1 tab in the am , and 2 tabs in the pm. 01/02/14   Lauree Chandler, NP  linagliptin (TRADJENTA) 5 MG TABS tablet Take 1 tablet (5 mg total) by  mouth daily. 01/02/14   Lauree Chandler, NP  meloxicam (MOBIC) 15 MG tablet Take 1 tablet (15 mg total) by mouth daily. 01/02/14   Lauree Chandler, NP  metoprolol tartrate (LOPRESSOR) 25 MG tablet Take 0.5 tablets (12.5 mg total) by mouth 3 (three) times a week. On Monday, Wednesday and Friday 01/02/14   Lauree Chandler, NP  Multiple Vitamin (MULTIVITAMIN WITH MINERALS) TABS Take 1 tablet by mouth daily.    [provider]  nitrofurantoin, macrocrystal-monohydrate, (MACROBID) 100 MG capsule Take 1 capsule (100 mg total) by mouth 2 (two) times daily. 10/30/16   Katelynne Revak, Wenda Overland, MD  omeprazole (PRILOSEC) 20 MG capsule Take 1 capsule (20 mg total) by mouth daily. 10/30/16   Marithza Malachi, Wenda Overland, MD  oxybutynin (DITROPAN) 5 MG tablet Take 1 tablet (5 mg total) by mouth 3 (three) times daily as needed for bladder spasms (Up to 3 times daily to control bladder spasms.). 01/02/14   Lauree Chandler, NP  phenazopyridine (PYRIDIUM) 100 MG tablet Take 1 tablet (100 mg total) by mouth 3 (three) times daily as needed for pain. 12/11/13   Blanchie Serve, MD  predniSONE (DELTASONE) 1 MG tablet TAKE THREE TABLETS BY MOUTH ONCE DAILY IN THE MORNING 04/22/14   Estill Dooms, MD  saccharomyces boulardii (FLORASTOR) 250 MG capsule Take 1 capsule (250 mg total) by mouth 2 (two) times daily. 12/16/13   Blanchie Serve, MD    Family History Family History  Problem Relation Age of Onset  . Cancer Mother   . Cancer Father   . Cancer Brother   . Cancer Daughter   . Leukemia Unknown     Social History Social History  Substance Use Topics  . Smoking status: Former Smoker    Packs/day: 2.00    Years: 30.00    Types: Cigarettes    Quit date: 01/11/1983  . Smokeless tobacco: Never Used  . Alcohol use No     Allergies   Allegra [fexofenadine hcl]; Levaquin [levofloxacin in d5w]; Tussionex pennkinetic er [employer] er]; Codeine; and Morphine and related   Review of  Systems Review of Systems  Gastrointestinal: Positive for abdominal pain.  All other systems reviewed and are negative except that which was mentioned in HPI    Physical Exam Updated Vital Signs BP (!) 143/84 (BP Location: Right Arm)   Pulse 74   Temp 98 F (36.7 C) (Oral)   Resp 14   Ht 5\' 5"  (1.651 m)   Wt 68.9 kg (152 lb)   SpO2 96%   BMI 25.29 kg/m   Physical Exam  Constitutional: She is oriented to person, place, and time. She appears well-developed and well-nourished. No distress.  HENT:  Head:  Normocephalic and atraumatic.  Moist mucous membranes  Eyes: Conjunctivae are normal.  Neck: Neck supple.  Cardiovascular: Normal rate, regular rhythm and normal heart sounds.   No murmur heard. Pulmonary/Chest: Effort normal and breath sounds normal.  Abdominal: Soft. Bowel sounds are normal. She exhibits no distension. There is no tenderness.  Musculoskeletal: She exhibits no edema.  Neurological: She is alert and oriented to person, place, and time.  Fluent speech  Skin: Skin is warm and dry.  Psychiatric: She has a normal mood and affect. Judgment normal.  Nursing note and vitals reviewed.    ED Treatments / Results  Labs (all labs ordered are listed, but only abnormal results are displayed) Labs Reviewed  CBC - Abnormal; Notable for the following:       Result Value   Platelets 107 (*)    All other components within normal limits  COMPREHENSIVE METABOLIC PANEL - Abnormal; Notable for the following:    Glucose, Bld 108 (*)    BUN 35 (*)    Creatinine, Ser 1.14 (*)    GFR calc non Af Amer 41 (*)    GFR calc Af Amer 48 (*)    All other components within normal limits  URINALYSIS, ROUTINE W REFLEX MICROSCOPIC - Abnormal; Notable for the following:    APPearance CLOUDY (*)    Hgb urine dipstick TRACE (*)    Ketones, ur 15 (*)    Protein, ur 30 (*)    Nitrite POSITIVE (*)    Leukocytes, UA MODERATE (*)    All other components within normal limits  BRAIN  NATRIURETIC PEPTIDE - Abnormal; Notable for the following:    B Natriuretic Peptide 395.0 (*)    All other components within normal limits  URINALYSIS, MICROSCOPIC (REFLEX) - Abnormal; Notable for the following:    Bacteria, UA MANY (*)    Squamous Epithelial / LPF 0-5 (*)    All other components within normal limits  URINE CULTURE  LIPASE, BLOOD  ACETAMINOPHEN LEVEL    EKG  EKG Interpretation  Date/Time:  Sunday October 30 2016 17:50:46 EDT Ventricular Rate:  77 PR Interval:  112 QRS Duration: 94 QT Interval:  376 QTC Calculation: 425 R Axis:   22 Text Interpretation:  Unusual P axis and short PR, probable junctional tachycardia with undetermined rhythm irregularity Abnormal ECG No significant change since last tracing Confirmed by Theotis Burrow [phone]) on 10/30/2016 8:30:09 PM       Radiology Dg Chest 2 View  Result Date: 10/30/2016 CLINICAL DATA:  Shortness of breath on exertion EXAM: CHEST  2 VIEW COMPARISON:  09/11/2016 FINDINGS: Cardiac shadow is again enlarged. Aortic calcifications are again seen. The lungs are well aerated bilaterally minimal left basilar scarring. Changes of prior vertebral augmentation are seen. Stable compression deformity is noted at T8. IMPRESSION: No active cardiopulmonary disease. Electronically Signed   By: Inez Catalina M.D.   On: 10/30/2016 21:14    Procedures Procedures (including critical care time)  Medications Ordered in ED Medications - No data to display   Initial Impression / Assessment and Plan / ED Course  I have reviewed the triage vital signs and the nursing notes.  Pertinent labs & imaging results that were available during my care of the patient were reviewed by me and considered in my medical decision making (see chart for details).     PT w/ weeks of Ongoing intermittent upper abdominal pain. She describes the pain as midepigastric to left upper quadrant, no focal right upper quadrant  pain. She had no focal tenderness on  exam, vital signs reassuring. I reviewed her chart which shows that she did have gallstones on previous CT but no wall inflammation.   Labs show evidence of UTI, CBC reassuring, PLT 107 but pt has h/o thrombocytopenia. Cr. 1.14 slightly improved from previous, normal LFTs and lipase. Pt describes pain as  Worse after she eats spicy foods. She has no RUQ tenderness, no radiation to back or shoulder and no lab abnormalities to suggest cholecystitis. I am unable to get an ultrasound at this time of night but I feel acute gallbladder pathology is less likely especially given that she states her pain is mostly to the left upper abdomen. It is possible that she has peptic ulcer disease and she has not found any improvement after being on Zantac from last visit, therefore switch her to omeprazole and discussed starting this medication and is continuing Zantac. I have discussed treatment of UTI with Macrobid, as I reviewed previous culture results and she has a complicated resistance pattern but her positive cultures appears susceptible to Macrobid. She will follow-up with PCP and I have explained that she will require reexamination and consideration of gallbladder ultrasound or gastroenterology referral. I have reviewed return precautions. They voiced understanding and patient was discharged in satisfactory condition.  Final Clinical Impressions(s) / ED Diagnoses   Final diagnoses:  Upper abdominal pain  Pain of upper abdomen  Acute cystitis without hematuria    New Prescriptions New Prescriptions   NITROFURANTOIN, MACROCRYSTAL-MONOHYDRATE, (MACROBID) 100 MG CAPSULE    Take 1 capsule (100 mg total) by mouth 2 (two) times daily.   OMEPRAZOLE (PRILOSEC) 20 MG CAPSULE    Take 1 capsule (20 mg total) by mouth daily.     Remee Charley, Wenda Overland, MD 10/30/16 [phone]

## 2016-11-02 LAB — URINE CULTURE: Culture: >=100000 — AB

## 2016-11-03 ENCOUNTER — Telehealth: Payer: Self-pay | Admitting: *Deleted

## 2016-11-03 NOTE — Telephone Encounter (Signed)
Post ED Visit - Positive Culture Follow-up  Culture report reviewed by antimicrobial stewardship pharmacist:  [x]  Elenor Quinones, Pharm.D. []  Heide Guile, Pharm.D., BCPS AQ-ID []  [place] Neptune, Pharm.D., BCPS []  Alycia Rossetti, Pharm.D., BCPS []  [place], Pharm.D., BCPS, AAHIVP []  Legrand Como, Pharm.D., BCPS, AAHIVP []  Salome Arnt, PharmD, BCPS []  Dimitri Ped, PharmD, BCPS []  Vincenza Hews, PharmD, BCPS  Positive urine culture Treated with Nitrofurantoin Monohyd Macro, organism sensitive to the same and no further patient follow-up is required at this time.  Harlon Flor [facility] 11/03/2016, 10:52 AM

## 2017-01-30 ENCOUNTER — Telehealth: Payer: Self-pay | Admitting: Emergency Medicine

## 2017-01-30 NOTE — Telephone Encounter (Signed)
Lost to followup 

## 2017-02-10 ENCOUNTER — Emergency Department (HOSPITAL_BASED_OUTPATIENT_CLINIC_OR_DEPARTMENT_OTHER)
Admission: EM | Admit: 2017-02-10 | Discharge: 2017-02-10 | Disposition: A | Discharge status: Home / Self Care | Payer: Medicare Other | Attending: Emergency Medicine | Admitting: Emergency Medicine

## 2017-02-10 ENCOUNTER — Emergency Department (HOSPITAL_BASED_OUTPATIENT_CLINIC_OR_DEPARTMENT_OTHER): Payer: Medicare Other

## 2017-02-10 ENCOUNTER — Encounter (HOSPITAL_BASED_OUTPATIENT_CLINIC_OR_DEPARTMENT_OTHER): Payer: Self-pay | Admitting: Emergency Medicine

## 2017-02-10 ENCOUNTER — Other Ambulatory Visit: Payer: Self-pay

## 2017-02-10 DIAGNOSIS — N3000 Acute cystitis without hematuria: Secondary | ICD-10-CM | POA: Diagnosis not present

## 2017-02-10 DIAGNOSIS — J449 Chronic obstructive pulmonary disease, unspecified: Secondary | ICD-10-CM | POA: Insufficient documentation

## 2017-02-10 DIAGNOSIS — I5032 Chronic diastolic (congestive) heart failure: Secondary | ICD-10-CM | POA: Insufficient documentation

## 2017-02-10 DIAGNOSIS — Z7984 Long term (current) use of oral hypoglycemic drugs: Secondary | ICD-10-CM | POA: Diagnosis not present

## 2017-02-10 DIAGNOSIS — Z79899 Other long term (current) drug therapy: Secondary | ICD-10-CM | POA: Insufficient documentation

## 2017-02-10 DIAGNOSIS — N183 Chronic kidney disease, stage 3 (moderate): Secondary | ICD-10-CM | POA: Insufficient documentation

## 2017-02-10 DIAGNOSIS — Z96643 Presence of artificial hip joint, bilateral: Secondary | ICD-10-CM | POA: Diagnosis not present

## 2017-02-10 DIAGNOSIS — M6281 Muscle weakness (generalized): Secondary | ICD-10-CM | POA: Diagnosis present

## 2017-02-10 DIAGNOSIS — F419 Anxiety disorder, unspecified: Secondary | ICD-10-CM | POA: Diagnosis not present

## 2017-02-10 DIAGNOSIS — Z8673 Personal history of transient ischemic attack (TIA), and cerebral infarction without residual deficits: Secondary | ICD-10-CM | POA: Insufficient documentation

## 2017-02-10 DIAGNOSIS — F329 Major depressive disorder, single episode, unspecified: Secondary | ICD-10-CM | POA: Insufficient documentation

## 2017-02-10 DIAGNOSIS — E1122 Type 2 diabetes mellitus with diabetic chronic kidney disease: Secondary | ICD-10-CM | POA: Insufficient documentation

## 2017-02-10 DIAGNOSIS — I130 Hypertensive heart and chronic kidney disease with heart failure and stage 1 through stage 4 chronic kidney disease, or unspecified chronic kidney disease: Secondary | ICD-10-CM | POA: Insufficient documentation

## 2017-02-10 DIAGNOSIS — Z87891 Personal history of nicotine dependence: Secondary | ICD-10-CM | POA: Diagnosis not present

## 2017-02-10 DIAGNOSIS — Z96653 Presence of artificial knee joint, bilateral: Secondary | ICD-10-CM | POA: Diagnosis not present

## 2017-02-10 DIAGNOSIS — Z7982 Long term (current) use of aspirin: Secondary | ICD-10-CM | POA: Insufficient documentation

## 2017-02-10 LAB — URINALYSIS, ROUTINE W REFLEX MICROSCOPIC
Bilirubin Urine: NEGATIVE
GLUCOSE, UA: NEGATIVE mg/dL
Ketones, ur: NEGATIVE mg/dL
Nitrite: POSITIVE — AB
PH: 6 (ref 5.0–8.0)
Protein, ur: NEGATIVE mg/dL
SPECIFIC GRAVITY, URINE: 1.015 (ref 1.005–1.030)

## 2017-02-10 LAB — COMPREHENSIVE METABOLIC PANEL
ALBUMIN: 3.3 g/dL — AB (ref 3.5–5.0)
ALT: 22 U/L (ref 14–54)
AST: 21 U/L (ref 15–41)
Alkaline Phosphatase: 72 U/L (ref 38–126)
Anion gap: 10 (ref 5–15)
BILIRUBIN TOTAL: 0.8 mg/dL (ref 0.3–1.2)
BUN: 37 mg/dL — AB (ref 6–20)
CO2: 21 mmol/L — ABNORMAL LOW (ref 22–32)
Calcium: 8.9 mg/dL (ref 8.9–10.3)
Chloride: 104 mmol/L (ref 101–111)
Creatinine, Ser: 1.29 mg/dL — ABNORMAL HIGH (ref 0.44–1.00)
GFR calc Af Amer: 41 mL/min — ABNORMAL LOW (ref >60)
GFR calc non Af Amer: 35 mL/min — ABNORMAL LOW (ref >60)
GLUCOSE: 231 mg/dL — AB (ref 65–99)
POTASSIUM: 4.2 mmol/L (ref 3.5–5.1)
Sodium: 135 mmol/L (ref 135–145)
TOTAL PROTEIN: 6.6 g/dL (ref 6.5–8.1)

## 2017-02-10 LAB — URINALYSIS, MICROSCOPIC (REFLEX)

## 2017-02-10 LAB — TROPONIN I

## 2017-02-10 LAB — CBC
HEMATOCRIT: 37.2 % (ref 36.0–46.0)
Hemoglobin: 12.3 g/dL (ref 12.0–15.0)
MCH: 31.7 pg (ref 26.0–34.0)
MCHC: 33.1 g/dL (ref 30.0–36.0)
MCV: 95.9 fL (ref 78.0–100.0)
Platelets: 108 10*3/uL — ABNORMAL LOW (ref 150–400)
RBC: 3.88 MIL/uL (ref 3.87–5.11)
RDW: 14.5 % (ref 11.5–15.5)
WBC: 11.3 10*3/uL — ABNORMAL HIGH (ref 4.0–10.5)

## 2017-02-10 LAB — BRAIN NATRIURETIC PEPTIDE: B Natriuretic Peptide: 281.3 pg/mL — ABNORMAL HIGH (ref 0.0–100.0)

## 2017-02-10 LAB — CBG MONITORING, ED: Glucose-Capillary: 217 mg/dL — ABNORMAL HIGH (ref 65–99)

## 2017-02-10 MED ORDER — DEXTROSE 5 % IV SOLN
1.0000 g | Freq: Once | INTRAVENOUS | Status: AC
  Administered 2017-02-10: 1 g via INTRAVENOUS
  Filled 2017-02-10: qty 10

## 2017-02-10 MED ORDER — CEPHALEXIN 500 MG PO CAPS: 500.0000 mg | ORAL_CAPSULE | Freq: Two times a day (BID) | ORAL | 0 refills | Status: DC

## 2017-02-10 NOTE — ED Notes (Signed)
Family at bedside. 

## 2017-02-10 NOTE — ED Notes (Signed)
Lab notified of orders 

## 2017-02-10 NOTE — ED Notes (Signed)
Pt in xray

## 2017-02-10 NOTE — ED Triage Notes (Addendum)
Granddaughter states she believes patient has a UTI.  Patient from home-lives by herself.  States that over the past few days she has been weaker than normal, "shaky", states normally patient can get up and take medications and eat breakfast without assistance but has been unable to do so.  Patient alert and oriented at present and responding appropriately but granddaughter states she seems lethargic.

## 2017-02-10 NOTE — Discharge Instructions (Signed)
Your symptoms are likely due to the UTI. We gave a IV dose of a medication for today. Please start taking Keflex tomorrow for a total of 7 days. Please make sure you stay hydrated. Please follow up with your PCP in 1 week. If your symptoms worsen or if you are unable to take the medication due to vomiting, please return to the ED.   Your EKG showed a slightly abnormal rhythm. This was present in the EKG in August. Please call the heart doctor and schedule an appointment.

## 2017-02-10 NOTE — ED Provider Notes (Signed)
[facility] EMERGENCY DEPARTMENT Provider Note   CSN: [id number] Arrival date & time: 02/10/17  1023     History   Chief Complaint Chief Complaint  Patient presents with  . Altered Mental Status    HPI [name] is a 83 y.o. female.  HPI 83 year old female with PMH of HTN, polymyalgia rheumatica, DM2, and CHF who presents with he grand-daughter who presents with 3-4 day history of decreased appetite and generalized weakness. She reports of dysuria yesterday but no dysuria today. No urinary frequency. Since she has been sick she has not been ambulating much, she has not been eating well, and only drinking when encouraged by family members. At baseline, she lives on her own, is able to ambulate around the home and make meals for herself. She reports of left chest pain this morning that lasted about 1 minute. It is difficult for her to describe. She is not sure if she had associated shortness of breath. Denies nausea or diaphoresis. No fevers or chills. No abdominal pain, diarrhea or constipation. No focal weakness or numbness, no slurred speech, no HA or blurred vision. No sick contacts. She did have 2 episodes of nonbloody vomiting yesterday. No nausea today.   Past Medical History:  Diagnosis Date  . Abnormality of gait   . Anemia, unspecified   . Anxiety state, unspecified   . Arthritis    BACK , NECK, AND JOINTS  . 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 CHF (congestive heart failure) ([place])   . Chronic cystitis   . Chronic steroid use   . COPD (chronic obstructive pulmonary disease) (HCC)    no inhalers used   . Depressive disorder, not elsewhere classified   . Dermatophytosis of nail   . Diabetic peripheral neuropathy ([place])   . Diaphragmatic hernia without mention of obstruction or gangrene   . Hemoperitoneum (nontraumatic)   . Herpes zoster with other nervous system complications(053.19)   . History of TIA (transient ischemic attack) 2012--  NO RESIDUAL   . HTN (hypertension)   . 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 Insulin dependent diabetes mellitus ([place])   . Macular degeneration of both eyes   . 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 Osteoporosis   . Osteoporosis, unspecified   . Other and unspecified hyperlipidemia   . Personality change due to conditions classified elsewhere   . PONV (postoperative nausea and vomiting)    in past, none recently  . 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 Temporal arteritis ([place])   . Type I (juvenile type) diabetes mellitus without mention of complication, uncontrolled   . Unspecified essential hypertension   . Unspecified late effects of cerebrovascular disease   . Urinary frequency     Patient Active Problem List   Diagnosis Date Noted  . Atrophic vaginitis 12/11/2013  . UTI (lower urinary tract infection) 11/16/2013  . Acute encephalopathy 11/16/2013  . FTT (failure to thrive) in adult 11/16/2013  . Subdural hematoma ([place]) 11/06/2013  . Fall 11/06/2013  . Hematoma of leg 09/04/2013  . Generalized osteoarthritis of hand 09/04/2013  . Cerumen impaction 09/04/2013  . Rales 01/15/2013  . Chronic kidney disease, stage III (moderate) ([place]) 12/19/2012  . Sinus congestion 10/24/2012  . Fracture of femoral neck, right, closed ([place]) 08/02/2012  . Hemoperitoneum (nontraumatic) 06/19/2012  . 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 Dysphagia, unspecified(787.20) 06/19/2012  . Urinary frequency 06/19/2012  .  Unspecified late effects of cerebrovascular disease 06/19/2012  . Personal history of fall 06/19/2012  . Abnormality of gait 06/19/2012  . Synovial cyst of popliteal space 06/19/2012  . Muscle weakness (generalized) 06/19/2012  . Osteoporosis, unspecified 06/19/2012  . Anxiety state, unspecified 06/19/2012  . Osteoarthrosis, unspecified whether generalized or localized, unspecified site 06/19/2012  . Pain in limb 06/19/2012    . Knee joint replacement by other means 06/19/2012  . Pain in joint, lower leg 06/19/2012  . Hyperlipidemia 06/19/2012  . Cervicalgia 06/19/2012  . Lack of coordination 06/19/2012  . Pain in joint, shoulder region 06/19/2012  . Dermatophytosis of nail 06/19/2012  . Unspecified urinary incontinence 06/19/2012  . Herpes zoster without mention of complication 38/75/6433  . Unspecified hereditary and idiopathic peripheral neuropathy 06/19/2012  . Osteoarthrosis, unspecified whether generalized or localized, lower leg 06/19/2012  . Lumbago 06/19/2012  . Enthesopathy of hip region 06/19/2012  . Internal hemorrhoids without mention of complication 29/51/8841  . Diaphragmatic hernia without mention of obstruction or gangrene 06/19/2012  . Renal abscess, right 06/11/2012  . Recurrent UTI 06/07/2012  . SIRS (systemic inflammatory response syndrome) ([place]) 06/07/2012  . Retroperitoneal bleed 03/20/2012  . Chronic diastolic congestive heart failure ([place]) 03/20/2012  . 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 Degenerative arthritis of cervical spine 02/09/2011  . Degenerative arthritis of hip 02/09/2011  . Degenerative arthritis of knee 02/09/2011  . Carpal tunnel syndrome of right wrist 02/09/2011  . TIA (transient ischemic attack) 02/09/2011  . Anemia 01/31/2011  . Hypoxia 01/31/2011  . Renal oncocytoma 01/12/2011  . Type II or unspecified type diabetes mellitus with renal manifestations, uncontrolled(250.42) 01/11/2011  . HTN (hypertension) 01/11/2011  . Polymyalgia rheumatica ([place]) 01/11/2011  . Depression 01/11/2011    Past Surgical History:  Procedure Laterality Date  . ANTERIOR CERVICAL DECOMP/DISCECTOMY FUSION  10-12-2005   C5 -- C7  . CARPAL TUNNEL RELEASE Right 2009  (APPROX)  . CATARACT EXTRACTION W/ INTRAOCULAR LENS  IMPLANT, BILATERAL Right Oct 1992   Epes  . CATARACT EXTRACTION W/ INTRAOCULAR LENS IMPLANT Left 1993   Epes  . CRYOABLATION RIGHT RENAL MASS  03-16-2012    ONCOCYTOMA (POST OP 06-09-2012 PERCUTANEOUS DRAIN PLACEMENT FOR PERINEPHRIC ABSCESS)  . CYSTOSCOPY W/ URETERAL STENT PLACEMENT Right 07/10/2012   Procedure: CYSTOSCOPY WITH RIGHT RETROGRADE PYELOGRAM/URETERAL STENT PLACEMENT;  Surgeon: Malka So, MD;  Location: [facility];  Service: Urology;  Laterality: Right;  . ELBOW SURGERY Bilateral 2003  (APPROX)  . HIP ARTHROPLASTY Right 08/03/2012   Procedure: CEMENTED MONOPOLAR HIP;  Surgeon: Marybelle Killings, MD;  Location: [facility];  Service: Orthopedics;  Laterality: Right;  . LUMBAR LAMINECTOMY  11-19-2003   L3 -- L4  . ORIF W/ PLATE RIGHT DISTAL RADIUS FX  06-18-2003  . POSTERIOR FUSION CERVICAL SPINE  02-10-2006   C5 -- C7  . TEMPORAL ARTERY BIOPSY / LIGATION    . TOTAL HIP ARTHROPLASTY Left 01-22-2005  . TOTAL KNEE ARTHROPLASTY Bilateral LEFT 08-08-2007;  RIGHT 05-08-2009    OB History    No data available       Home Medications    Prior to Admission medications   Medication Sig Start Date End Date Taking? Authorizing Provider  acyclovir (ZOVIRAX) 200 MG capsule TAKE ONE CAPSULE BY MOUTH ONCE DAILY **TO  PREVENT  RELAPSE  OF  HERPES  SIMPLEX** 10/27/14   Estill Dooms, MD  aspirin EC 325 MG tablet Take 1 tablet (325 mg total) by mouth daily. 01/02/14   Sherrie Mustache  K, NP  cephALEXin (KEFLEX) 500 MG capsule Take 1 capsule (500 mg total) by mouth 2 (two) times daily. 02/10/17   Smiley Houseman, MD  conjugated estrogens (PREMARIN) vaginal cream Place 1 Applicatorful vaginally daily. 01/02/14   Lauree Chandler, NP  Cranberry 475 MG CAPS Take 1-2 capsules by mouth 2 (two) times daily. Take 1 capsule every morning and 2 capsules every night.    [provider]  feeding supplement, GLUCERNA SHAKE, (GLUCERNA SHAKE) LIQD Take 237 mLs by mouth 2 (two) times daily between meals. 01/02/14   Lauree Chandler, NP  furosemide (LASIX) 20 MG tablet Take 1 tablet (20 mg total) by mouth daily. 01/02/14   Lauree Chandler, NP  gabapentin (NEURONTIN) 300 MG capsule Take 1 capsule (300 mg total) by mouth 3 (three) times daily. Taking 1 tab in the am , and 2 tabs in the pm. 01/02/14   Lauree Chandler, NP  linagliptin (TRADJENTA) 5 MG TABS tablet Take 1 tablet (5 mg total) by mouth daily. 01/02/14   Lauree Chandler, NP  meloxicam (MOBIC) 15 MG tablet Take 1 tablet (15 mg total) by mouth daily. 01/02/14   Lauree Chandler, NP  metoprolol tartrate (LOPRESSOR) 25 MG tablet Take 0.5 tablets (12.5 mg total) by mouth 3 (three) times a week. On Monday, Wednesday and Friday 01/02/14   Lauree Chandler, NP  Multiple Vitamin (MULTIVITAMIN WITH MINERALS) TABS Take 1 tablet by mouth daily.    [provider]  nitrofurantoin, macrocrystal-monohydrate, (MACROBID) 100 MG capsule Take 1 capsule (100 mg total) by mouth 2 (two) times daily. 10/30/16   Little, Wenda Overland, MD  omeprazole (PRILOSEC) 20 MG capsule Take 1 capsule (20 mg total) by mouth daily. 10/30/16   Little, Wenda Overland, MD  oxybutynin (DITROPAN) 5 MG tablet Take 1 tablet (5 mg total) by mouth 3 (three) times daily as needed for bladder spasms (Up to 3 times daily to control bladder spasms.). 01/02/14   Lauree Chandler, NP  phenazopyridine (PYRIDIUM) 100 MG tablet Take 1 tablet (100 mg total) by mouth 3 (three) times daily as needed for pain. 12/11/13   Blanchie Serve, MD  predniSONE (DELTASONE) 1 MG tablet TAKE THREE TABLETS BY MOUTH ONCE DAILY IN THE MORNING 04/22/14   Estill Dooms, MD  saccharomyces boulardii (FLORASTOR) 250 MG capsule Take 1 capsule (250 mg total) by mouth 2 (two) times daily. 12/16/13   Blanchie Serve, MD    Family History Family History  Problem Relation Age of Onset  . Cancer Mother   . Cancer Father   . Cancer Brother   . Cancer Daughter   . Leukemia Unknown     Social History Social History   Tobacco Use  . Smoking status: Former Smoker    Packs/day: 2.00    Years: 30.00    Pack years: 60.00     Types: Cigarettes    Last attempt to quit: 01/11/1983    Years since quitting: 34.1  . Smokeless tobacco: Never Used  Substance Use Topics  . Alcohol use: No  . Drug use: No     Allergies   Allegra [fexofenadine hcl]; Levaquin [levofloxacin in d5w]; Tussionex pennkinetic er [employer] er]; Codeine; and Morphine and related   Review of Systems Review of Systems  Constitutional: Positive for activity change and appetite change. Negative for chills and fever.  HENT: Negative for congestion, rhinorrhea and sore throat.   Respiratory: Negative for cough and shortness of breath.  Reports of chronic dyspnea on exertion (not worsened)   Cardiovascular: Positive for chest pain. Negative for palpitations and leg swelling.  Gastrointestinal: Negative for abdominal pain, constipation and diarrhea.     Physical Exam Updated Vital Signs BP 118/71 (BP Location: Left Arm)   Pulse 64   Temp 97.6 F (36.4 C) (Oral)   Resp 20   Ht 5\' 5"  (1.651 m)   SpO2 96%   BMI 25.29 kg/m   Physical Exam  Constitutional: She is oriented to person, place, and time. No distress.  Frail   HENT:  Oropharynx clear but mildly dry.   Eyes: Conjunctivae are normal. Pupils are equal, round, and reactive to light.  Neck: Neck supple.  Cardiovascular: Normal rate.  Irregular rhythm.   Pulmonary/Chest: Effort normal and breath sounds normal. She has no wheezes. She has no rales.  Mild crackles at the bases bilaterally.   Abdominal: Soft. Bowel sounds are normal. There is tenderness (suprapubic tenderness).  Lymphadenopathy:    She has no cervical adenopathy.  Neurological: She is alert and oriented to person, place, and time. No cranial nerve deficit or sensory deficit.  General weakness. Strength 4/5 in all extremities.    Skin: Skin is warm and dry. Capillary refill takes less than 2 seconds. No rash noted. She is not diaphoretic.  Psychiatric: She has a normal mood and affect. Her  behavior is normal. Judgment and thought content normal.     ED Treatments / Results  Labs (all labs ordered are listed, but only abnormal results are displayed) Labs Reviewed  COMPREHENSIVE METABOLIC PANEL - Abnormal; Notable for the following components:      Result Value   CO2 21 (*)    Glucose, Bld 231 (*)    BUN 37 (*)    Creatinine, Ser 1.29 (*)    Albumin 3.3 (*)    GFR calc non Af Amer 35 (*)    GFR calc Af Amer 41 (*)    All other components within normal limits  CBC - Abnormal; Notable for the following components:   WBC 11.3 (*)    Platelets 108 (*)    All other components within normal limits  URINALYSIS, ROUTINE W REFLEX MICROSCOPIC - Abnormal; Notable for the following components:   APPearance HAZY (*)    Hgb urine dipstick TRACE (*)    Nitrite POSITIVE (*)    Leukocytes, UA MODERATE (*)    All other components within normal limits  BRAIN NATRIURETIC PEPTIDE - Abnormal; Notable for the following components:   B Natriuretic Peptide 281.3 (*)    All other components within normal limits  URINALYSIS, MICROSCOPIC (REFLEX) - Abnormal; Notable for the following components:   Bacteria, UA MANY (*)    Squamous Epithelial / LPF 0-5 (*)    All other components within normal limits  CBG MONITORING, ED - Abnormal; Notable for the following components:   Glucose-Capillary 217 (*)    All other components within normal limits  TROPONIN I    EKG  EKG Interpretation  Date/Time:  Friday February 10 2017 11:39:27 EST Ventricular Rate:  67 PR Interval:  122 QRS Duration: 90 QT Interval:  390 QTC Calculation: 412 R Axis:   27 Text Interpretation:  unsual P axis and varian PR interval. possible atrial fib vs junctional rhythm Nonspecific T wave abnormality Abnormal ECG Otherwise no significant change Reconfirmed by Addison Lank [phone]) on 02/10/2017 12:06:14 PM       Radiology Dg Chest 2 View  Result Date:  02/10/2017 CLINICAL DATA:  Chest pain, nausea, vomiting  EXAM: CHEST  2 VIEW COMPARISON:  10/30/2016 FINDINGS: Mild cardiomegaly. No confluent airspace opacity, effusion or edema. Multiple compression fractures in the thoracic spine, stable. Tortuous thoracic aorta. IMPRESSION: Cardiomegaly.  No active disease. Electronically Signed   By: Rolm Baptise M.D.   On: 02/10/2017 11:38    Procedures Procedures (including critical care time)  Medications Ordered in ED Medications  cefTRIAXone (ROCEPHIN) 1 g in dextrose 5 % 50 mL IVPB (0 g Intravenous Stopped 02/10/17 1328)     Initial Impression / Assessment and Plan / ED Course  I have reviewed the triage vital signs and the nursing notes.  Pertinent labs & imaging results that were available during my care of the patient were reviewed by me and considered in my medical decision making (see chart for details).  83 year old female with PMH of HTN, polymyalgia rheumatica, DM2, and CHF who presents with he grand-daughter who presents with 3-4 day history of decreased appetite and generalized weakness. Vitals are stable. Her symptoms are likely due to UTI as her UA is significant for positive nitrite, moderate leukocytes, trace hemoglobin. CBC with mild leukocytosis to 11.5 otherwise unremarkable. CMP with Cr essentially stable at 1.29. EKG with irregular rhythm: unsual P axis and varian PR interval. possible atrial fib vs junctional rhythm. Troponin < 0.03. BNP 281, which is lower than three months ago. Not consistent with acute CHF exacerbation. No focal neurological deficits concerning for stroke. CXR is normal. Rocephin x 1 given for UTI. Rx given for keflex 500mg  BID x 7 days (lower dose due to creatinine clearance). Return precautions discussed.   Regarding her EKG, recommended outpatient follow up with cardiology (contact information given) and recommended starting daily aspirin.   Final Clinical Impressions(s) / ED Diagnoses   Final diagnoses:  Acute cystitis without hematuria    ED Discharge Orders         Ordered    cephALEXin (KEFLEX) 500 MG capsule  2 times daily     02/10/17 1320       Smiley Houseman, MD 02/10/17 1531    Smiley Houseman, MD 02/10/17 1532    Fatima Blank, MD 02/11/17 [phone]

## 2017-02-10 NOTE — ED Notes (Signed)
Pt in wheelchair, pt normally utilizes cane.

## 2017-02-10 NOTE — ED Notes (Signed)
PT unable to void at this time. 

## 2017-02-10 NOTE — ED Notes (Signed)
ED Provider at bedside. 

## 2017-02-23 ENCOUNTER — Ambulatory Visit: Payer: Medicare Other | Admitting: Internal Medicine

## 2017-03-13 ENCOUNTER — Inpatient Hospital Stay (HOSPITAL_COMMUNITY)
Admission: EM | Admit: 2017-03-13 | Discharge: 2017-03-18 | DRG: 480 | Disposition: A | Discharge status: Skilled Nursing Facility | Payer: Medicare Other | Attending: Family Medicine | Admitting: Family Medicine

## 2017-03-13 ENCOUNTER — Ambulatory Visit: Payer: Medicare Other | Admitting: Internal Medicine

## 2017-03-13 ENCOUNTER — Emergency Department (HOSPITAL_COMMUNITY): Payer: Medicare Other

## 2017-03-13 ENCOUNTER — Other Ambulatory Visit: Payer: Self-pay

## 2017-03-13 ENCOUNTER — Encounter (HOSPITAL_COMMUNITY): Payer: Self-pay | Admitting: Emergency Medicine

## 2017-03-13 ENCOUNTER — Encounter: Payer: Self-pay | Admitting: Internal Medicine

## 2017-03-13 VITALS — BP 152/85 | HR 95 | Ht 61.0 in | Wt 144.0 lb

## 2017-03-13 DIAGNOSIS — Z981 Arthrodesis status: Secondary | ICD-10-CM

## 2017-03-13 DIAGNOSIS — E8889 Other specified metabolic disorders: Secondary | ICD-10-CM | POA: Diagnosis present

## 2017-03-13 DIAGNOSIS — Z8679 Personal history of other diseases of the circulatory system: Secondary | ICD-10-CM

## 2017-03-13 DIAGNOSIS — I10 Essential (primary) hypertension: Secondary | ICD-10-CM

## 2017-03-13 DIAGNOSIS — Y838 Other surgical procedures as the cause of abnormal reaction of the patient, or of later complication, without mention of misadventure at the time of the procedure: Secondary | ICD-10-CM | POA: Diagnosis not present

## 2017-03-13 DIAGNOSIS — N183 Chronic kidney disease, stage 3 unspecified: Secondary | ICD-10-CM | POA: Diagnosis present

## 2017-03-13 DIAGNOSIS — Y92039 Unspecified place in apartment as the place of occurrence of the external cause: Secondary | ICD-10-CM

## 2017-03-13 DIAGNOSIS — S72411A Displaced unspecified condyle fracture of lower end of right femur, initial encounter for closed fracture: Secondary | ICD-10-CM | POA: Diagnosis not present

## 2017-03-13 DIAGNOSIS — I82401 Acute embolism and thrombosis of unspecified deep veins of right lower extremity: Secondary | ICD-10-CM | POA: Diagnosis not present

## 2017-03-13 DIAGNOSIS — J449 Chronic obstructive pulmonary disease, unspecified: Secondary | ICD-10-CM | POA: Diagnosis present

## 2017-03-13 DIAGNOSIS — Z8553 Personal history of malignant neoplasm of renal pelvis: Secondary | ICD-10-CM

## 2017-03-13 DIAGNOSIS — M810 Age-related osteoporosis without current pathological fracture: Secondary | ICD-10-CM | POA: Diagnosis present

## 2017-03-13 DIAGNOSIS — Z794 Long term (current) use of insulin: Secondary | ICD-10-CM

## 2017-03-13 DIAGNOSIS — E1142 Type 2 diabetes mellitus with diabetic polyneuropathy: Secondary | ICD-10-CM | POA: Diagnosis present

## 2017-03-13 DIAGNOSIS — Z96653 Presence of artificial knee joint, bilateral: Secondary | ICD-10-CM | POA: Diagnosis present

## 2017-03-13 DIAGNOSIS — D62 Acute posthemorrhagic anemia: Secondary | ICD-10-CM | POA: Diagnosis present

## 2017-03-13 DIAGNOSIS — Z01818 Encounter for other preprocedural examination: Secondary | ICD-10-CM

## 2017-03-13 DIAGNOSIS — Z960 Presence of urogenital implants: Secondary | ICD-10-CM | POA: Diagnosis present

## 2017-03-13 DIAGNOSIS — E1122 Type 2 diabetes mellitus with diabetic chronic kidney disease: Secondary | ICD-10-CM | POA: Diagnosis present

## 2017-03-13 DIAGNOSIS — W2201XA Walked into wall, initial encounter: Secondary | ICD-10-CM | POA: Diagnosis present

## 2017-03-13 DIAGNOSIS — Z79899 Other long term (current) drug therapy: Secondary | ICD-10-CM

## 2017-03-13 DIAGNOSIS — W19XXXA Unspecified fall, initial encounter: Secondary | ICD-10-CM

## 2017-03-13 DIAGNOSIS — S72301A Unspecified fracture of shaft of right femur, initial encounter for closed fracture: Secondary | ICD-10-CM | POA: Diagnosis present

## 2017-03-13 DIAGNOSIS — Z881 Allergy status to other antibiotic agents status: Secondary | ICD-10-CM

## 2017-03-13 DIAGNOSIS — D693 Immune thrombocytopenic purpura: Secondary | ICD-10-CM | POA: Diagnosis present

## 2017-03-13 DIAGNOSIS — I6990 Unspecified sequelae of unspecified cerebrovascular disease: Secondary | ICD-10-CM | POA: Diagnosis not present

## 2017-03-13 DIAGNOSIS — D708 Other neutropenia: Secondary | ICD-10-CM | POA: Diagnosis present

## 2017-03-13 DIAGNOSIS — D638 Anemia in other chronic diseases classified elsewhere: Secondary | ICD-10-CM | POA: Diagnosis present

## 2017-03-13 DIAGNOSIS — I509 Heart failure, unspecified: Secondary | ICD-10-CM

## 2017-03-13 DIAGNOSIS — T8172XA Complication of vein following a procedure, not elsewhere classified, initial encounter: Secondary | ICD-10-CM | POA: Diagnosis not present

## 2017-03-13 DIAGNOSIS — T380X5A Adverse effect of glucocorticoids and synthetic analogues, initial encounter: Secondary | ICD-10-CM | POA: Diagnosis present

## 2017-03-13 DIAGNOSIS — M9711XA Periprosthetic fracture around internal prosthetic right knee joint, initial encounter: Secondary | ICD-10-CM | POA: Diagnosis present

## 2017-03-13 DIAGNOSIS — N3020 Other chronic cystitis without hematuria: Secondary | ICD-10-CM | POA: Diagnosis present

## 2017-03-13 DIAGNOSIS — R51 Headache: Secondary | ICD-10-CM | POA: Diagnosis present

## 2017-03-13 DIAGNOSIS — Z87891 Personal history of nicotine dependence: Secondary | ICD-10-CM

## 2017-03-13 DIAGNOSIS — Y92230 Patient room in hospital as the place of occurrence of the external cause: Secondary | ICD-10-CM | POA: Diagnosis not present

## 2017-03-13 DIAGNOSIS — W010XXA Fall on same level from slipping, tripping and stumbling without subsequent striking against object, initial encounter: Secondary | ICD-10-CM | POA: Diagnosis present

## 2017-03-13 DIAGNOSIS — I4891 Unspecified atrial fibrillation: Secondary | ICD-10-CM

## 2017-03-13 DIAGNOSIS — Z419 Encounter for procedure for purposes other than remedying health state, unspecified: Secondary | ICD-10-CM

## 2017-03-13 DIAGNOSIS — I130 Hypertensive heart and chronic kidney disease with heart failure and stage 1 through stage 4 chronic kidney disease, or unspecified chronic kidney disease: Secondary | ICD-10-CM | POA: Diagnosis present

## 2017-03-13 DIAGNOSIS — M7989 Other specified soft tissue disorders: Secondary | ICD-10-CM | POA: Diagnosis not present

## 2017-03-13 DIAGNOSIS — E1129 Type 2 diabetes mellitus with other diabetic kidney complication: Secondary | ICD-10-CM | POA: Diagnosis present

## 2017-03-13 DIAGNOSIS — I5032 Chronic diastolic (congestive) heart failure: Secondary | ICD-10-CM | POA: Diagnosis present

## 2017-03-13 DIAGNOSIS — M1990 Unspecified osteoarthritis, unspecified site: Secondary | ICD-10-CM | POA: Diagnosis present

## 2017-03-13 DIAGNOSIS — R269 Unspecified abnormalities of gait and mobility: Secondary | ICD-10-CM | POA: Diagnosis present

## 2017-03-13 DIAGNOSIS — M353 Polymyalgia rheumatica: Secondary | ICD-10-CM | POA: Diagnosis present

## 2017-03-13 DIAGNOSIS — M79609 Pain in unspecified limb: Secondary | ICD-10-CM | POA: Diagnosis not present

## 2017-03-13 DIAGNOSIS — M9701XA Periprosthetic fracture around internal prosthetic right hip joint, initial encounter: Secondary | ICD-10-CM

## 2017-03-13 DIAGNOSIS — Z7901 Long term (current) use of anticoagulants: Secondary | ICD-10-CM

## 2017-03-13 DIAGNOSIS — H3530 Unspecified macular degeneration: Secondary | ICD-10-CM | POA: Diagnosis present

## 2017-03-13 DIAGNOSIS — Z791 Long term (current) use of non-steroidal anti-inflammatories (NSAID): Secondary | ICD-10-CM

## 2017-03-13 DIAGNOSIS — Z885 Allergy status to narcotic agent status: Secondary | ICD-10-CM

## 2017-03-13 DIAGNOSIS — Z792 Long term (current) use of antibiotics: Secondary | ICD-10-CM

## 2017-03-13 DIAGNOSIS — Z7952 Long term (current) use of systemic steroids: Secondary | ICD-10-CM

## 2017-03-13 DIAGNOSIS — Z888 Allergy status to other drugs, medicaments and biological substances status: Secondary | ICD-10-CM

## 2017-03-13 DIAGNOSIS — Z87448 Personal history of other diseases of urinary system: Secondary | ICD-10-CM

## 2017-03-13 DIAGNOSIS — S72411D Displaced unspecified condyle fracture of lower end of right femur, subsequent encounter for closed fracture with routine healing: Secondary | ICD-10-CM | POA: Diagnosis not present

## 2017-03-13 DIAGNOSIS — S7290XA Unspecified fracture of unspecified femur, initial encounter for closed fracture: Secondary | ICD-10-CM

## 2017-03-13 DIAGNOSIS — R52 Pain, unspecified: Secondary | ICD-10-CM

## 2017-03-13 LAB — BASIC METABOLIC PANEL
Anion gap: 8 (ref 5–15)
BUN: 27 mg/dL — AB (ref 6–20)
CALCIUM: 9.1 mg/dL (ref 8.9–10.3)
CO2: 22 mmol/L (ref 22–32)
CREATININE: 1.19 mg/dL — AB (ref 0.44–1.00)
Chloride: 105 mmol/L (ref 101–111)
GFR calc Af Amer: 45 mL/min — ABNORMAL LOW (ref >60)
GFR calc non Af Amer: 39 mL/min — ABNORMAL LOW (ref >60)
Glucose, Bld: 135 mg/dL — ABNORMAL HIGH (ref 65–99)
POTASSIUM: 4.8 mmol/L (ref 3.5–5.1)
SODIUM: 135 mmol/L (ref 135–145)

## 2017-03-13 LAB — CBC WITH DIFFERENTIAL/PLATELET
BASOS ABS: 0 10*3/uL (ref 0.0–0.1)
BASOS PCT: 0 %
EOS ABS: 0.1 10*3/uL (ref 0.0–0.7)
EOS PCT: 1 %
HCT: 34.9 % — ABNORMAL LOW (ref 36.0–46.0)
Hemoglobin: 10.9 g/dL — ABNORMAL LOW (ref 12.0–15.0)
Lymphocytes Relative: 18 %
Lymphs Abs: 1.8 10*3/uL (ref 0.7–4.0)
MCH: 30.4 pg (ref 26.0–34.0)
MCHC: 31.2 g/dL (ref 30.0–36.0)
MCV: 97.2 fL (ref 78.0–100.0)
MONO ABS: 1 10*3/uL (ref 0.1–1.0)
Monocytes Relative: 10 %
Neutro Abs: 7.2 10*3/uL (ref 1.7–7.7)
Neutrophils Relative %: 71 %
PLATELETS: 125 10*3/uL — AB (ref 150–400)
RBC: 3.59 MIL/uL — AB (ref 3.87–5.11)
RDW: 14.7 % (ref 11.5–15.5)
WBC: 10 10*3/uL (ref 4.0–10.5)

## 2017-03-13 MED ORDER — INSULIN LISPRO 100 UNIT/ML ~~LOC~~ SOLN
SUBCUTANEOUS | Status: DC
Start: 2017-03-11 — End: 2017-03-13

## 2017-03-13 MED ORDER — GENERIC EXTERNAL MEDICATION
Status: DC
Start: ? — End: 2017-03-13

## 2017-03-13 MED ORDER — FUROSEMIDE 40 MG PO TABS
40.00 | ORAL_TABLET | ORAL | Status: DC
Start: 2017-03-12 — End: 2017-03-13

## 2017-03-13 MED ORDER — CIPROFLOXACIN IN D5W 400 MG/200ML IV SOLN
400.00 | INTRAVENOUS | Status: DC
Start: 2017-03-11 — End: 2017-03-13

## 2017-03-13 MED ORDER — INSULIN LISPRO 100 UNIT/ML ~~LOC~~ SOLN
SUBCUTANEOUS | Status: DC
Start: ? — End: 2017-03-13

## 2017-03-13 MED ORDER — PANTOPRAZOLE SODIUM 40 MG PO TBEC
40.0000 mg | DELAYED_RELEASE_TABLET | Freq: Every day | ORAL | Status: DC
  Administered 2017-03-16 – 2017-03-18 (×3): 40 mg via ORAL
  Filled 2017-03-13 (×4): qty 1

## 2017-03-13 MED ORDER — HYDROCORTISONE NA SUCCINATE PF 100 MG IJ SOLR
50.0000 mg | Freq: Two times a day (BID) | INTRAMUSCULAR | Status: DC
  Administered 2017-03-14 – 2017-03-17 (×5): 50 mg via INTRAVENOUS
  Filled 2017-03-13 (×7): qty 1
  Filled 2017-03-13: qty 2

## 2017-03-13 MED ORDER — METRONIDAZOLE IN NACL 5-0.79 MG/ML-% IV SOLN
500.00 | INTRAVENOUS | Status: DC
Start: 2017-03-11 — End: 2017-03-13

## 2017-03-13 MED ORDER — URSODIOL 300 MG PO CAPS
150.0000 mg | ORAL_CAPSULE | Freq: Two times a day (BID) | ORAL | Status: DC
  Administered 2017-03-14 (×2): 300 mg via ORAL
  Filled 2017-03-13 (×4): qty 1

## 2017-03-13 MED ORDER — INSULIN GLARGINE 100 UNIT/ML ~~LOC~~ SOLN
SUBCUTANEOUS | Status: DC
Start: 2017-03-11 — End: 2017-03-13

## 2017-03-13 MED ORDER — PREDNISONE 1 MG PO TABS
3.00 | ORAL_TABLET | ORAL | Status: DC
Start: 2017-03-12 — End: 2017-03-13

## 2017-03-13 MED ORDER — LISINOPRIL 5 MG PO TABS
5.00 | ORAL_TABLET | ORAL | Status: DC
Start: 2017-03-12 — End: 2017-03-13

## 2017-03-13 MED ORDER — GABAPENTIN 300 MG PO CAPS
600.00 | ORAL_CAPSULE | ORAL | Status: DC
Start: 2017-03-11 — End: 2017-03-13

## 2017-03-13 MED ORDER — AMITRIPTYLINE HCL 50 MG PO TABS
50.00 | ORAL_TABLET | ORAL | Status: DC
Start: 2017-03-11 — End: 2017-03-13

## 2017-03-13 MED ORDER — METOPROLOL TARTRATE 25 MG PO TABS
25.00 | ORAL_TABLET | ORAL | Status: DC
Start: 2017-03-11 — End: 2017-03-13

## 2017-03-13 MED ORDER — OXYBUTYNIN CHLORIDE 5 MG PO TABS
5.0000 mg | ORAL_TABLET | Freq: Three times a day (TID) | ORAL | Status: DC | PRN
  Filled 2017-03-13: qty 1

## 2017-03-13 MED ORDER — URSODIOL 300 MG PO CAPS
300.00 | ORAL_CAPSULE | ORAL | Status: DC
Start: 2017-03-11 — End: 2017-03-13

## 2017-03-13 MED ORDER — HYDRALAZINE HCL 20 MG/ML IJ SOLN
10.00 | INTRAMUSCULAR | Status: DC
Start: ? — End: 2017-03-13

## 2017-03-13 MED ORDER — LINAGLIPTIN 5 MG PO TABS
5.0000 mg | ORAL_TABLET | Freq: Every day | ORAL | Status: DC
  Administered 2017-03-16 – 2017-03-18 (×3): 5 mg via ORAL
  Filled 2017-03-13 (×3): qty 1

## 2017-03-13 MED ORDER — FENTANYL CITRATE (PF) 100 MCG/2ML IJ SOLN
50.0000 ug | INTRAMUSCULAR | Status: DC | PRN
  Administered 2017-03-13 (×2): 50 ug via INTRAVENOUS
  Filled 2017-03-13 (×2): qty 2

## 2017-03-13 MED ORDER — INSULIN ASPART 100 UNIT/ML ~~LOC~~ SOLN
0.0000 [IU] | Freq: Three times a day (TID) | SUBCUTANEOUS | Status: DC
  Administered 2017-03-14 (×2): 1 [IU] via SUBCUTANEOUS
  Administered 2017-03-14: 2 [IU] via SUBCUTANEOUS
  Administered 2017-03-15: 3 [IU] via SUBCUTANEOUS
  Administered 2017-03-16: 7 [IU] via SUBCUTANEOUS
  Administered 2017-03-16: 3 [IU] via SUBCUTANEOUS
  Administered 2017-03-16: 7 [IU] via SUBCUTANEOUS
  Administered 2017-03-17: 5 [IU] via SUBCUTANEOUS
  Administered 2017-03-17: 3 [IU] via SUBCUTANEOUS
  Administered 2017-03-17: 5 [IU] via SUBCUTANEOUS
  Administered 2017-03-18 (×2): 3 [IU] via SUBCUTANEOUS

## 2017-03-13 MED ORDER — APIXABAN 5 MG PO TABS: 5.0000 mg | ORAL_TABLET | Freq: Two times a day (BID) | ORAL | 5 refills | Status: DC

## 2017-03-13 MED ORDER — ACETAMINOPHEN 325 MG PO TABS
650.00 | ORAL_TABLET | ORAL | Status: DC
Start: ? — End: 2017-03-13

## 2017-03-13 MED ORDER — GABAPENTIN 300 MG PO CAPS
300.0000 mg | ORAL_CAPSULE | Freq: Three times a day (TID) | ORAL | Status: DC
  Administered 2017-03-14 – 2017-03-18 (×10): 300 mg via ORAL
  Filled 2017-03-13 (×11): qty 1

## 2017-03-13 MED ORDER — PANTOPRAZOLE SODIUM 20 MG PO TBEC
20.00 | DELAYED_RELEASE_TABLET | ORAL | Status: DC
Start: 2017-03-12 — End: 2017-03-13

## 2017-03-13 MED ORDER — ENOXAPARIN SODIUM 30 MG/0.3ML ~~LOC~~ SOLN
30.00 | SUBCUTANEOUS | Status: DC
Start: 2017-03-12 — End: 2017-03-13

## 2017-03-13 MED ORDER — METOPROLOL TARTRATE 12.5 MG HALF TABLET
12.5000 mg | ORAL_TABLET | Freq: Two times a day (BID) | ORAL | Status: DC
  Administered 2017-03-14 – 2017-03-18 (×7): 12.5 mg via ORAL
  Filled 2017-03-13 (×8): qty 1

## 2017-03-13 MED ORDER — ACETAMINOPHEN 650 MG RE SUPP
650.00 | RECTAL | Status: DC
Start: ? — End: 2017-03-13

## 2017-03-13 NOTE — Progress Notes (Signed)
OFFICE NOTE  Chief Complaint:  New patient visit for [name]  Primary Care Physician: Patient, No Pcp Per  HPI:  [name] is a 84 y.o. female with a past medial history significant for PMR, hypertension, diabetes and diastolic congestive heart failure.  She had an echo in 2012 which showed normal systolic function, but it has not been repeated.  Recently she was hospitalized for cystitis/UTI in December at the time she was noted to have an irregular tachycardia suggestive of [name] versus a junctional rhythm.  It was noted that this was similar to her prior EKG several months ago and "not related to her presentation" therefore cardiology follow-up was recommended and they suggested starting low-dose aspirin.  Today she is here for that cardiology follow-up.  EKG clearly shows atrial fibrillation with controlled ventricular response at 95-personally reviewed.  She is asymptomatic with this, is not clear when the onset of her [name] was however it sounds like it may have been several months ago.  Since he was discharged from the hospital on December eighth, 2018, he had another episode of UTI which required a visit to the emergency department on January 3.  She denies chest pain or worsening shortness of breath.  Given her age of 38, female gender, hypertension, diabetes, and history of diastolic congestive heart failure, her CHADSVASC score is 6.  This would suggest a very high risk of stroke.  In discussion with her granddaughter who accompanied her today, she says that family is nearby and helps arrange her medications.  Although the patient lives alone and does use a cane when she is out of the house, she gets around fairly well and is not had a fall in more than a year.  Cognition is generally pretty good without any significant memory problems.  Family history indicates most of her relatives died of cancer old age but no significant heart disease.  PMHx:  Past Medical History:  Diagnosis  Date  . Abnormality of gait   . Anemia, unspecified   . Anxiety state, unspecified   . Arthritis    BACK , NECK, AND JOINTS  . 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 CHF (congestive heart failure) ([place])   . Chronic cystitis   . Chronic steroid use   . COPD (chronic obstructive pulmonary disease) ([facility])    no inhalers used   . Depressive disorder, not elsewhere classified   . Dermatophytosis of nail   . Diabetic peripheral neuropathy ([place])   . Diaphragmatic hernia without mention of obstruction or gangrene   . Hemoperitoneum (nontraumatic)   . Herpes zoster with other nervous system complications(053.19)   . History of TIA (transient ischemic attack) 2012--  NO RESIDUAL  . HTN (hypertension)   . 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 Insulin dependent diabetes mellitus ([place])   . Macular degeneration of both eyes   . Malignant neoplasm of renal pelvis ([place])   . Osteoporosis   . Osteoporosis, unspecified   . Other and unspecified hyperlipidemia   . Personality change due to conditions classified elsewhere   . PONV (postoperative nausea and vomiting)    in past, none recently  . 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 Temporal arteritis ([place])   . Type I (juvenile type) diabetes mellitus without mention of complication, uncontrolled   . Unspecified essential hypertension   . Unspecified late effects of cerebrovascular disease   . Urinary frequency  Past Surgical History:  Procedure Laterality Date  . ANTERIOR CERVICAL DECOMP/DISCECTOMY FUSION  10-12-2005   C5 -- C7  . CARPAL TUNNEL RELEASE Right 2009  (APPROX)  . CATARACT EXTRACTION W/ INTRAOCULAR LENS  IMPLANT, BILATERAL Right Oct 1992   Epes  . CATARACT EXTRACTION W/ INTRAOCULAR LENS IMPLANT Left 1993   Epes  . CRYOABLATION RIGHT RENAL MASS  03-16-2012   ONCOCYTOMA (POST OP 06-09-2012 PERCUTANEOUS DRAIN PLACEMENT FOR PERINEPHRIC ABSCESS)  . CYSTOSCOPY W/ URETERAL STENT  PLACEMENT Right 07/10/2012   Procedure: CYSTOSCOPY WITH RIGHT RETROGRADE PYELOGRAM/URETERAL STENT PLACEMENT;  Surgeon: Malka So, MD;  Location: [facility];  Service: Urology;  Laterality: Right;  . ELBOW SURGERY Bilateral 2003  (APPROX)  . HIP ARTHROPLASTY Right 08/03/2012   Procedure: CEMENTED MONOPOLAR HIP;  Surgeon: Marybelle Killings, MD;  Location: [facility];  Service: Orthopedics;  Laterality: Right;  . LUMBAR LAMINECTOMY  11-19-2003   L3 -- L4  . ORIF W/ PLATE RIGHT DISTAL RADIUS FX  06-18-2003  . POSTERIOR FUSION CERVICAL SPINE  02-10-2006   C5 -- C7  . TEMPORAL ARTERY BIOPSY / LIGATION    . TOTAL HIP ARTHROPLASTY Left 01-22-2005  . TOTAL KNEE ARTHROPLASTY Bilateral LEFT 08-08-2007;  RIGHT 05-08-2009    FAMHx:  Family History  Problem Relation Age of Onset  . Cancer Mother   . Cancer Father   . Cancer Brother   . Cancer Daughter   . Leukemia Unknown     SOCHx:   reports that she quit smoking about 34 years ago. Her smoking use included cigarettes. She has a 60.00 pack-year smoking history. she has never used smokeless tobacco. She reports that she does not drink alcohol or use drugs.  ALLERGIES:  Allergies  Allergen Reactions  . Allegra [Fexofenadine Hcl]     Unknown reaction  . Levaquin [Levofloxacin In D5w] Other (See Comments)    Unknown reaction  . Tussionex Pennkinetic Er [Hydrocod Polst-Cpm Polst Er] Nausea And Vomiting  . Codeine Nausea And Vomiting  . Morphine And Related Nausea And Vomiting    ROS: Pertinent items noted in HPI and remainder of comprehensive ROS otherwise negative.  HOME MEDS: Current Outpatient Medications on File Prior to Visit  Medication Sig Dispense Refill  . feeding supplement, GLUCERNA SHAKE, (GLUCERNA SHAKE) LIQD Take 237 mLs by mouth 2 (two) times daily between meals. 237 mL 0  . gabapentin (NEURONTIN) 300 MG capsule Take 1 capsule (300 mg total) by mouth 3 (three) times daily. Taking 1 tab in the am , and 2 tabs in the  pm. 90 capsule 1  . linagliptin (TRADJENTA) 5 MG TABS tablet Take 1 tablet (5 mg total) by mouth daily. 30 tablet 3  . meloxicam (MOBIC) 15 MG tablet Take 1 tablet (15 mg total) by mouth daily. 30 tablet 1  . metoprolol tartrate (LOPRESSOR) 25 MG tablet Take 0.5 tablets (12.5 mg total) by mouth 3 (three) times a week. On Monday, Wednesday and Friday 12 tablet 1  . Multiple Vitamin (MULTIVITAMIN WITH MINERALS) TABS Take 1 tablet by mouth daily.    Marland Kitchen omeprazole (PRILOSEC) 20 MG capsule Take 1 capsule (20 mg total) by mouth daily. 14 capsule 0  . oxybutynin (DITROPAN) 5 MG tablet Take 1 tablet (5 mg total) by mouth 3 (three) times daily as needed for bladder spasms (Up to 3 times daily to control bladder spasms.). 90 tablet 1  . predniSONE (DELTASONE) 1 MG tablet TAKE THREE TABLETS BY MOUTH ONCE DAILY IN THE  MORNING 90 tablet 1   No current facility-administered medications on file prior to visit.     LABS/IMAGING: No results found for this or any previous visit (from the past 48 hour(s)). No results found.  LIPID PANEL:    Component Value Date/Time   CHOL 131 09/04/2013 1508   TRIG 65 09/04/2013 1508   HDL 62 09/04/2013 1508   CHOLHDL 2.1 09/04/2013 1508   LDLCALC 56 09/04/2013 1508     WEIGHTS: Wt Readings from Last 3 Encounters:  03/13/17 144 lb (65.3 kg)  10/30/16 152 lb (68.9 kg)  01/02/14 152 lb 9.6 oz (69.2 kg)    VITALS: BP (!) 152/85   Pulse 95   Ht 5\' 1"  (1.549 m)   Wt 144 lb (65.3 kg)   BMI 27.21 kg/m   EXAM: General appearance: alert, appears stated age, no distress and thin Neck: no carotid bruit, no JVD and thyroid not enlarged, symmetric, no tenderness/mass/nodules Lungs: clear to auscultation bilaterally Heart: irregularly irregular rhythm Abdomen: soft, non-tender; bowel sounds normal; no masses,  no organomegaly Extremities: extremities normal, atraumatic, no cyanosis or edema Pulses: 2+ and symmetric Skin: Skin color, texture, turgor normal. No rashes  or lesions Neurologic: Grossly normal Psych: Pleasant  EKG: [name] with PVCs at 95- personally reviewed  ASSESSMENT: 1. Paroxysmal or possibly persistent atrial fibrillation-asymptomatic 2. CHADSVASC score of 6 3. Hypertension 4. Type 2 diabetes 5. History of diastolic congestive heart failure 6. Recurrent UTI  PLAN: 1.   Ms. Seibold has paroxysmal or possibly persistent atrial fibrillation, but she is asymptomatic with this.  She has a history of diastolic congestive heart failure however most recently had an episode associated with recurrent UTI.  Her CHADSVASC score is high at 6 and would benefit from anticoagulation.  She never started on aspirin as was suggested during her recent hospitalization.  She was not evaluated by cardiology for [name].  I would recommend starting Eliquis 5 mg twice daily (age greater than 40, however creatinine less than 1.5 and weight greater than 60 kg).  We will have to monitor for stability and falls.  I do not think she is a candidate for cardioversion as she is asymptomatic and may be difficult to rhythm control given recurrent UTI.  We will plan on anticoagulation and a rate-control strategy.  Follow-up in 3 months.  Pixie Casino, MD, [facility], [place] Director of the [facility] Lipid [facility]  Cardiovascular [facility] Diplomate of the American Board of Clinical Lipidology Attending Cardiologist  Direct Dial: [phone]  Fax: [phone]  Website:  [email or web address] 03/13/2017, 11:39 AM

## 2017-03-13 NOTE — Patient Instructions (Addendum)
Your physician has recommended you make the following change in your medication:  -- START Eliquis 5mg  twice daily (or every 12 hours)  - 2 boxes samples + free 30 day card provided to patient -- please use meloxicam only AS NEEDED -- can use tylenol instead of meloxicam  Your physician recommends that you schedule a follow-up appointment in 3 months with Dr. Debara Pickett

## 2017-03-13 NOTE — ED Triage Notes (Signed)
Per EMS: Pt from home with right knee deformity due to a fall.  Pt was answering her door when she tripped and fell.  Pt denies and LOC.  Recently started on blood thinners for A-Fib.  Pt was administered 150 mcg of Fentanyl pta.

## 2017-03-13 NOTE — Progress Notes (Signed)
Orthopedic Tech Progress Note Patient Details:  [name] 12-Jun-1924 [id number]  Ortho Devices Type of Ortho Device: Long leg splint Ortho Device/Splint Location: Right Ortho Device/Splint Interventions: Application   Post Interventions Patient Tolerated: Well   Kristopher Oppenheim 03/13/2017, 9:42 PM

## 2017-03-13 NOTE — H&P (Signed)
History and Physical    [name] XFG:[id number] DOB: 1924-12-06 DOA: 03/13/2017  PCP: Patient, No Pcp Per  Patient coming from: Home.  Chief Complaint: Fall.  HPI: [name] is a 84 y.o. female with history of recently diagnosed atrial fibrillation, diastolic CHF, diabetes mellitus, polymyalgia rheumatica, chronic kidney disease and recently treated for UTI had gone to a cardiologist yesterday and was started on Eliquis.  Patient on returning home close to the mandible and was starting back walked back to her room when she suddenly slipped and fell.  Patient states she may have hit her head but denies losing consciousness.  Her right lower extremity was hurting and patient was brought to the ER.  Denies any chest pain or shortness of breath.  ED Course: In the ER CT head was unremarkable.  X-rays of the lower extremity revealed right femur fracture.  Since patient has multiple medical issues hospitalist has been requested admission.  Dr. Lorin Mercy, orthopedic surgeon will be consulting for possible surgery.  Review of Systems: As per HPI, rest all negative.   Past Medical History:  Diagnosis Date  . Abnormality of gait   . Anemia, unspecified   . Anxiety state, unspecified   . Arthritis    BACK , NECK, AND JOINTS  . 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 CHF (congestive heart failure) ([place])   . Chronic cystitis   . Chronic steroid use   . COPD (chronic obstructive pulmonary disease) (HCC)    no inhalers used   . Depressive disorder, not elsewhere classified   . Dermatophytosis of nail   . Diabetic peripheral neuropathy ([place])   . Diaphragmatic hernia without mention of obstruction or gangrene   . Hemoperitoneum (nontraumatic)   . Herpes zoster with other nervous system complications(053.19)   . History of TIA (transient ischemic attack) 2012--  NO RESIDUAL  . HTN (hypertension)   . 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 Insulin dependent diabetes mellitus ([place])     . Macular degeneration of both eyes   . Malignant neoplasm of renal pelvis ([place])   . Osteoporosis   . Osteoporosis, unspecified   . Other and unspecified hyperlipidemia   . Personality change due to conditions classified elsewhere   . PONV (postoperative nausea and vomiting)    in past, none recently  . 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 Temporal arteritis ([place])   . Type I (juvenile type) diabetes mellitus without mention of complication, uncontrolled   . Unspecified essential hypertension   . Unspecified late effects of cerebrovascular disease   . Urinary frequency     Past Surgical History:  Procedure Laterality Date  . ANTERIOR CERVICAL DECOMP/DISCECTOMY FUSION  10-12-2005   C5 -- C7  . CARPAL TUNNEL RELEASE Right 2009  (APPROX)  . CATARACT EXTRACTION W/ INTRAOCULAR LENS  IMPLANT, BILATERAL Right Oct 1992   Epes  . CATARACT EXTRACTION W/ INTRAOCULAR LENS IMPLANT Left 1993   [place]  . CRYOABLATION RIGHT RENAL MASS  03-16-2012   ONCOCYTOMA (POST OP 06-09-2012 PERCUTANEOUS DRAIN PLACEMENT FOR PERINEPHRIC ABSCESS)  . CYSTOSCOPY W/ URETERAL STENT PLACEMENT Right 07/10/2012   Procedure: CYSTOSCOPY WITH RIGHT RETROGRADE PYELOGRAM/URETERAL STENT PLACEMENT;  Surgeon: Malka So, MD;  Location: [facility];  Service: Urology;  Laterality: Right;  . ELBOW SURGERY Bilateral 2003  (APPROX)  . HIP ARTHROPLASTY Right 08/03/2012   Procedure: CEMENTED MONOPOLAR HIP;  Surgeon: Marybelle Killings, MD;  Location: [facility];  Service: Orthopedics;  Laterality: Right;  . LUMBAR LAMINECTOMY  11-19-2003   L3 -- L4  . ORIF W/ PLATE RIGHT DISTAL RADIUS FX  06-18-2003  . POSTERIOR FUSION CERVICAL SPINE  02-10-2006   C5 -- C7  . TEMPORAL ARTERY BIOPSY / LIGATION    . TOTAL HIP ARTHROPLASTY Left 01-22-2005  . TOTAL KNEE ARTHROPLASTY Bilateral LEFT 08-08-2007;  RIGHT 05-08-2009     reports that she quit smoking about 34 years ago. Her smoking use  included cigarettes. She has a 60.00 pack-year smoking history. she has never used smokeless tobacco. She reports that she does not drink alcohol or use drugs.  Allergies  Allergen Reactions  . Allegra [Fexofenadine Hcl]     Unknown reaction  . Levaquin [Levofloxacin In D5w] Other (See Comments)    Unknown reaction  . Tussionex Pennkinetic Er [Hydrocod Polst-Cpm Polst Er] Nausea And Vomiting  . Codeine Nausea And Vomiting  . Morphine And Related Nausea And Vomiting    Family History  Problem Relation Age of Onset  . Cancer Mother   . Cancer Father   . Cancer Brother   . Cancer Daughter   . Leukemia Unknown     Prior to Admission medications   Medication Sig Start Date End Date Taking? Authorizing Provider  apixaban (ELIQUIS) 5 MG TABS tablet Take 1 tablet (5 mg total) by mouth 2 (two) times daily. 03/13/17  Yes Hilty, Nadean Corwin, MD  ciprofloxacin (CIPRO) 500 MG tablet Take 500 mg by mouth 2 (two) times daily.   Yes [provider]  feeding supplement, GLUCERNA SHAKE, (GLUCERNA SHAKE) LIQD Take 237 mLs by mouth 2 (two) times daily between meals. 01/02/14  Yes Lauree Chandler, NP  gabapentin (NEURONTIN) 300 MG capsule Take 1 capsule (300 mg total) by mouth 3 (three) times daily. Taking 1 tab in the am , and 2 tabs in the pm. 01/02/14  Yes Eubanks, Carlos American, NP  glimepiride (AMARYL) 2 MG tablet Take 2 mg by mouth daily with breakfast.   Yes [provider]  linagliptin (TRADJENTA) 5 MG TABS tablet Take 1 tablet (5 mg total) by mouth daily. 01/02/14  Yes Lauree Chandler, NP  meloxicam (MOBIC) 15 MG tablet Take 1 tablet (15 mg total) by mouth daily. 01/02/14  Yes Lauree Chandler, NP  metoprolol tartrate (LOPRESSOR) 25 MG tablet Take 0.5 tablets (12.5 mg total) by mouth 3 (three) times a week. On Monday, Wednesday and Friday Patient taking differently: Take 12.5 mg by mouth 2 (two) times daily. On Monday, Wednesday and Friday 01/02/14  Yes Lauree Chandler, NP   metroNIDAZOLE (FLAGYL) 500 MG tablet Take 500 mg by mouth 3 (three) times daily.   Yes [provider]  omeprazole (PRILOSEC) 20 MG capsule Take 1 capsule (20 mg total) by mouth daily. 10/30/16  Yes Little, Wenda Overland, MD  predniSONE (DELTASONE) 1 MG tablet TAKE THREE TABLETS BY MOUTH ONCE DAILY IN THE MORNING Patient taking differently: TAKE 3 mg TABLETS BY MOUTH ONCE DAILY IN THE MORNING 04/22/14  Yes Estill Dooms, MD  ursodiol (ACTIGALL) 300 MG capsule Take 150 mg by mouth 2 (two) times daily.   Yes [provider]  oxybutynin (DITROPAN) 5 MG tablet Take 1 tablet (5 mg total) by mouth 3 (three) times daily as needed for bladder spasms (Up to 3 times daily to control bladder spasms.). 01/02/14   Lauree Chandler, NP    Physical Exam: Vitals:   03/13/17 2045 03/13/17 2131  03/13/17 2145 03/13/17 2215  BP: (!) 133/92 136/86 (!) 133/98 114/76  Pulse: (!) 56 84  78  Resp: 14 19 14 14   Temp:      TempSrc:      SpO2: 97% 100% 98% 93%      Constitutional: Moderately built and nourished. Vitals:   03/13/17 2045 03/13/17 2131 03/13/17 2145 03/13/17 2215  BP: (!) 133/92 136/86 (!) 133/98 114/76  Pulse: (!) 56 84  78  Resp: 14 19 14 14   Temp:      TempSrc:      SpO2: 97% 100% 98% 93%   Eyes: Anicteric no pallor. ENMT: No discharge from the ears eyes nose or mouth. Neck: No mass felt.  No neck rigidity. Respiratory: No rhonchi or crepitations. Cardiovascular: S1-S2 heard no murmurs appreciated. Abdomen: Soft nontender bowel sounds present. Musculoskeletal: Right leg has been placed on brace. Skin: No rash. Neurologic: Alert awake oriented to time place and person.  Moves all extremities. Psychiatric: Appears normal.   Labs on Admission: I have personally reviewed following labs and imaging studies  CBC: Recent Labs  Lab 03/13/17 2010  WBC 10.0  NEUTROABS 7.2  HGB 10.9*  HCT 34.9*  MCV 97.2  PLT 981*   Basic Metabolic Panel: Recent Labs  Lab  03/13/17 2010  NA 135  K 4.8  CL 105  CO2 22  GLUCOSE 135*  BUN 27*  CREATININE 1.19*  CALCIUM 9.1   GFR: Estimated Creatinine Clearance: 27.2 mL/min (A) (by C-G formula based on SCr of 1.19 mg/dL (H)). Liver Function Tests: No results for input(s): AST, ALT, ALKPHOS, BILITOT, PROT, ALBUMIN in the last 168 hours. No results for input(s): LIPASE, AMYLASE in the last 168 hours. No results for input(s): AMMONIA in the last 168 hours. Coagulation Profile: No results for input(s): INR, PROTIME in the last 168 hours. Cardiac Enzymes: No results for input(s): CKTOTAL, CKMB, CKMBINDEX, TROPONINI in the last 168 hours. BNP (last 3 results) No results for input(s): PROBNP in the last 8760 hours. HbA1C: No results for input(s): HGBA1C in the last 72 hours. CBG: No results for input(s): GLUCAP in the last 168 hours. Lipid Profile: No results for input(s): CHOL, HDL, LDLCALC, TRIG, CHOLHDL, LDLDIRECT in the last 72 hours. Thyroid Function Tests: No results for input(s): TSH, T4TOTAL, FREET4, T3FREE, THYROIDAB in the last 72 hours. Anemia Panel: No results for input(s): VITAMINB12, FOLATE, FERRITIN, TIBC, IRON, RETICCTPCT in the last 72 hours. Urine analysis:    Component Value Date/Time   COLORURINE YELLOW 02/10/2017 1213   APPEARANCEUR HAZY (A) 02/10/2017 1213   APPEARANCEUR Clear 05/07/2013 1436   LABSPEC 1.015 02/10/2017 1213   PHURINE 6.0 02/10/2017 1213   GLUCOSEU NEGATIVE 02/10/2017 1213   HGBUR TRACE (A) 02/10/2017 1213   BILIRUBINUR NEGATIVE 02/10/2017 1213   BILIRUBINUR Neg 12/11/2013 1345   BILIRUBINUR Negative 05/07/2013 1436   KETONESUR NEGATIVE 02/10/2017 1213   PROTEINUR NEGATIVE 02/10/2017 1213   UROBILINOGEN 0.2 12/11/2013 1345   UROBILINOGEN 1.0 11/16/2013 1256   NITRITE POSITIVE (A) 02/10/2017 1213   LEUKOCYTESUR MODERATE (A) 02/10/2017 1213   LEUKOCYTESUR Negative 05/07/2013 1436   Sepsis Labs: @LABRCNTIP (procalcitonin:4,lacticidven:4) )No results found  for this or any previous visit (from the past 240 hour(s)).   Radiological Exams on Admission: Dg Knee 1-2 Views Right  Result Date: 03/13/2017 CLINICAL DATA:  Fall with right leg deformity.  Initial encounter. EXAM: RIGHT KNEE - 1-2 VIEW COMPARISON:  None. FINDINGS: Comminuted fracture of the right femoral shaft extending from the  midportion to the supracondylar femur. On the AP view there is extension to the level of a total knee arthroplasty that is nondisplaced. IMPRESSION: Displaced fracture of the right femoral shaft that extends to the knee arthroplasty on AP view. The arthroplasty hardware is located. Electronically Signed   By: Monte Fantasia M.D.   On: 03/13/2017 19:48   Ct Head Wo Contrast  Result Date: 03/13/2017 CLINICAL DATA:  Tripped and fell.  Hit head. EXAM: CT HEAD WITHOUT CONTRAST TECHNIQUE: Contiguous axial images were obtained from the base of the skull through the vertex without intravenous contrast. COMPARISON:  09/11/2016 FINDINGS: Brain: Stable age related cerebral atrophy, ventriculomegaly and periventricular white matter disease. No extra-axial fluid collections are identified. No CT findings for acute hemispheric infarction or intracranial hemorrhage. No mass lesions. The brainstem and cerebellum are normal. Vascular: Stable vascular calcifications. No hyperdense vessels or aneurysm. Skull: No acute skull or facial bone fractures. Sinuses/Orbits: Chronic left maxillary sinus disease with high attenuation material filling the sinus. This is likely chronic inspissated debris. The mastoid air cells and middle ear cavities are clear. The globes are intact. Other: No scalp lesions or hematoma. IMPRESSION: No acute intracranial findings or skull fracture. Chronic left maxillary sinus disease. Electronically Signed   By: Marijo Sanes M.D.   On: 03/13/2017 19:30   Dg Femur Min 2 Views Right  Result Date: 03/13/2017 CLINICAL DATA:  Right knee deformity due to fall. Initial encounter.  EXAM: RIGHT FEMUR 2 VIEWS COMPARISON:  None. FINDINGS: Comminuted fracture of the right femoral shaft extending from the midportion to the supracondylar femur. On the AP view there is extension to the level of a total knee arthroplasty that is nondisplaced. Right hip hemiarthroplasty with lucent appearance around the femoral stem. Located knee and hip joint. IMPRESSION: 1. Displaced fracture of the right femoral shaft reaching the located knee arthroplasty. 2. Right hip hemiarthroplasty with lucency around the femoral stem. Electronically Signed   By: Monte Fantasia M.D.   On: 03/13/2017 19:47    EKG: Independently reviewed.  A. fib rate controlled.  Assessment/Plan Principal Problem:   Closed fracture of condyle of right femur (HCC) Active Problems:   Essential hypertension   Polymyalgia rheumatica (HCC)   Chronic heart failure (HCC)   Chronic kidney disease, stage III (moderate) (HCC)   Atrial fibrillation (HCC)   Type 2 diabetes mellitus with renal manifestations ([place])   Femur fracture ([place])    1. Right femoral fracture status post mechanical fall -patient has taken 1 dose of apixaban as per the report yesterday morning.  Will wait for at least 24 hours before surgery.  Will keep patient on pain relief medications.  Patient will be at moderate risk for intermediate risk procedure. 2. Atrial fibrillation chads 2 vasc score of 6.  Patient is on beta-blockers for rate control.  Apixaban will be on hold in anticipation of surgery. 3. History of polymyalgia rheumatica -we will keep patient on stress dose steroids until patient surgeries or after surgery patient can be back on home dose of prednisone. 4. Chronic kidney disease stage III -creatinine appears to be at baseline. 5. Anemia and thrombocytopenia -appears to be chronic but anemia has worsened likely from blood loss.  Follow CBC.  Type and screen. 6. History of diastolic CHF appears compensated.  Chest x-ray is pending.  Hold Lasix until  surgery. 7. History of diabetes mellitus type 2 will hold Amaryl but continue Tradjenta with sliding scale coverage. 8. Recently diagnosed UTI -was on antibiotics.  Patient presently asymptomatic check UA.    DVT prophylaxis: SCDs. Code Status: Full code. Family Communication: Discussed with patient. Disposition Plan: Possibly rehab. Consults called: Orthopedics. Admission status: Inpatient.   Rise Patience MD [facility] Pager [phone].  If 7PM-7AM, please contact night-coverage [email or web address] Password [facility]  03/13/2017, 10:51 PM

## 2017-03-13 NOTE — ED Notes (Signed)
[name] (5N) called to get report- [phone]

## 2017-03-13 NOTE — ED Provider Notes (Signed)
[facility] EMERGENCY DEPARTMENT Provider Note   CSN: [id number] Arrival date & time: 03/13/17  1816     History   Chief Complaint Chief Complaint  Patient presents with  . Fall    HPI [name] is a 84 y.o. female.  HPI  84 year old female with a history of atrial fibrillation and CHF presents with a fall.  She was placed on Eliquis today and has taken 1 dose today.  She was closing her door and turning around when she tripped and fell.  She hit her head on the wall but did not lose consciousness.  She also injured her right thigh near her knee.  EMS gave 150 mcg of fentanyl.  Her pain is better and a 6 out of 10.  She has not been able to get up and walk.  She laid on the ground for a couple hours before EMS picked her up.  She denies any weakness or numbness.  She has a moderate headache.  Past Medical History:  Diagnosis Date  . Abnormality of gait   . Anemia, unspecified   . Anxiety state, unspecified   . Arthritis    BACK , NECK, AND JOINTS  . 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 CHF (congestive heart failure) ([place])   . Chronic cystitis   . Chronic steroid use   . COPD (chronic obstructive pulmonary disease) (HCC)    no inhalers used   . Depressive disorder, not elsewhere classified   . Dermatophytosis of nail   . Diabetic peripheral neuropathy ([place])   . Diaphragmatic hernia without mention of obstruction or gangrene   . Hemoperitoneum (nontraumatic)   . Herpes zoster with other nervous system complications(053.19)   . History of TIA (transient ischemic attack) 2012--  NO RESIDUAL  . HTN (hypertension)   . 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 Insulin dependent diabetes mellitus ([place])   . Macular degeneration of both eyes   . 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 Osteoporosis   . Osteoporosis, unspecified   . Other and unspecified hyperlipidemia   . Personality change due to conditions classified elsewhere   . PONV  (postoperative nausea and vomiting)    in past, none recently  . 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 Temporal arteritis ([place])   . Type I (juvenile type) diabetes mellitus without mention of complication, uncontrolled   . Unspecified essential hypertension   . Unspecified late effects of cerebrovascular disease   . Urinary frequency     Patient Active Problem List   Diagnosis Date Noted  . Atrial fibrillation ([place]) 03/13/2017  . Closed fracture of condyle of right femur ([place]) 03/13/2017  . Type 2 diabetes mellitus with renal manifestations ([place]) 03/13/2017  . Femur fracture ([place]) 03/13/2017  . Atrophic vaginitis 12/11/2013  . UTI (lower urinary tract infection) 11/16/2013  . Acute encephalopathy 11/16/2013  . FTT (failure to thrive) in adult 11/16/2013  . Subdural hematoma ([place]) 11/06/2013  . Fall 11/06/2013  . Hematoma of leg 09/04/2013  . Generalized osteoarthritis of hand 09/04/2013  . Cerumen impaction 09/04/2013  . Rales 01/15/2013  . Chronic kidney disease, stage III (moderate) ([place]) 12/19/2012  . Sinus congestion 10/24/2012  . Fracture of femoral neck, right, closed ([place]) 08/02/2012  . Hemoperitoneum (nontraumatic) 06/19/2012  . 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 Dysphagia, unspecified(787.20) 06/19/2012  . Urinary frequency 06/19/2012  . Unspecified late effects of  cerebrovascular disease 06/19/2012  . Personal history of fall 06/19/2012  . Abnormality of gait 06/19/2012  . Synovial cyst of popliteal space 06/19/2012  . Muscle weakness (generalized) 06/19/2012  . Osteoporosis, unspecified 06/19/2012  . Anxiety state, unspecified 06/19/2012  . Osteoarthrosis, unspecified whether generalized or localized, unspecified site 06/19/2012  . Pain in limb 06/19/2012  . Knee joint replacement by other means 06/19/2012  . Pain in joint, lower leg 06/19/2012  . Hyperlipidemia 06/19/2012  . Cervicalgia 06/19/2012  .  Lack of coordination 06/19/2012  . Pain in joint, shoulder region 06/19/2012  . Dermatophytosis of nail 06/19/2012  . Unspecified urinary incontinence 06/19/2012  . Herpes zoster without mention of complication 71/69/6789  . Unspecified hereditary and idiopathic peripheral neuropathy 06/19/2012  . Osteoarthrosis, unspecified whether generalized or localized, lower leg 06/19/2012  . Lumbago 06/19/2012  . Enthesopathy of hip region 06/19/2012  . Internal hemorrhoids without mention of complication 38/12/1749  . Diaphragmatic hernia without mention of obstruction or gangrene 06/19/2012  . Renal abscess, right 06/11/2012  . Recurrent UTI 06/07/2012  . SIRS (systemic inflammatory response syndrome) ([place]) 06/07/2012  . Retroperitoneal bleed 03/20/2012  . Chronic heart failure ([place]) 03/20/2012  . 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 Degenerative arthritis of cervical spine 02/09/2011  . Degenerative arthritis of hip 02/09/2011  . Degenerative arthritis of knee 02/09/2011  . Carpal tunnel syndrome of right wrist 02/09/2011  . TIA (transient ischemic attack) 02/09/2011  . Anemia 01/31/2011  . Hypoxia 01/31/2011  . Renal oncocytoma 01/12/2011  . Type II or unspecified type diabetes mellitus with renal manifestations, uncontrolled(250.42) 01/11/2011  . Essential hypertension 01/11/2011  . Polymyalgia rheumatica ([place]) 01/11/2011  . Depression 01/11/2011    Past Surgical History:  Procedure Laterality Date  . ANTERIOR CERVICAL DECOMP/DISCECTOMY FUSION  10-12-2005   C5 -- C7  . CARPAL TUNNEL RELEASE Right 2009  (APPROX)  . CATARACT EXTRACTION W/ INTRAOCULAR LENS  IMPLANT, BILATERAL Right Oct 1992   Epes  . CATARACT EXTRACTION W/ INTRAOCULAR LENS IMPLANT Left 1993   Epes  . CRYOABLATION RIGHT RENAL MASS  03-16-2012   ONCOCYTOMA (POST OP 06-09-2012 PERCUTANEOUS DRAIN PLACEMENT FOR PERINEPHRIC ABSCESS)  . CYSTOSCOPY W/ URETERAL STENT PLACEMENT Right 07/10/2012   Procedure: CYSTOSCOPY  WITH RIGHT RETROGRADE PYELOGRAM/URETERAL STENT PLACEMENT;  Surgeon: Malka So, MD;  Location: [facility];  Service: Urology;  Laterality: Right;  . ELBOW SURGERY Bilateral 2003  (APPROX)  . HIP ARTHROPLASTY Right 08/03/2012   Procedure: CEMENTED MONOPOLAR HIP;  Surgeon: Marybelle Killings, MD;  Location: [facility];  Service: Orthopedics;  Laterality: Right;  . LUMBAR LAMINECTOMY  11-19-2003   L3 -- L4  . ORIF W/ PLATE RIGHT DISTAL RADIUS FX  06-18-2003  . POSTERIOR FUSION CERVICAL SPINE  02-10-2006   C5 -- C7  . TEMPORAL ARTERY BIOPSY / LIGATION    . TOTAL HIP ARTHROPLASTY Left 01-22-2005  . TOTAL KNEE ARTHROPLASTY Bilateral LEFT 08-08-2007;  RIGHT 05-08-2009    OB History    No data available       Home Medications    Prior to Admission medications   Medication Sig Start Date End Date Taking? Authorizing Provider  apixaban (ELIQUIS) 5 MG TABS tablet Take 1 tablet (5 mg total) by mouth 2 (two) times daily. 03/13/17  Yes Hilty, Nadean Corwin, MD  ciprofloxacin (CIPRO) 500 MG tablet Take 500 mg by mouth 2 (two) times daily.   Yes [provider]  feeding supplement, GLUCERNA SHAKE, (GLUCERNA SHAKE) LIQD Take 237 mLs  by mouth 2 (two) times daily between meals. 01/02/14  Yes Lauree Chandler, NP  gabapentin (NEURONTIN) 300 MG capsule Take 1 capsule (300 mg total) by mouth 3 (three) times daily. Taking 1 tab in the am , and 2 tabs in the pm. 01/02/14  Yes Eubanks, Carlos American, NP  glimepiride (AMARYL) 2 MG tablet Take 2 mg by mouth daily with breakfast.   Yes [provider]  linagliptin (TRADJENTA) 5 MG TABS tablet Take 1 tablet (5 mg total) by mouth daily. 01/02/14  Yes Lauree Chandler, NP  meloxicam (MOBIC) 15 MG tablet Take 1 tablet (15 mg total) by mouth daily. 01/02/14  Yes Lauree Chandler, NP  metoprolol tartrate (LOPRESSOR) 25 MG tablet Take 0.5 tablets (12.5 mg total) by mouth 3 (three) times a week. On Monday, Wednesday and Friday Patient taking  differently: Take 12.5 mg by mouth 2 (two) times daily. On Monday, Wednesday and Friday 01/02/14  Yes Lauree Chandler, NP  metroNIDAZOLE (FLAGYL) 500 MG tablet Take 500 mg by mouth 3 (three) times daily.   Yes [provider]  omeprazole (PRILOSEC) 20 MG capsule Take 1 capsule (20 mg total) by mouth daily. 10/30/16  Yes Little, Wenda Overland, MD  predniSONE (DELTASONE) 1 MG tablet TAKE THREE TABLETS BY MOUTH ONCE DAILY IN THE MORNING Patient taking differently: TAKE 3 mg TABLETS BY MOUTH ONCE DAILY IN THE MORNING 04/22/14  Yes Estill Dooms, MD  ursodiol (ACTIGALL) 300 MG capsule Take 150 mg by mouth 2 (two) times daily.   Yes [provider]  oxybutynin (DITROPAN) 5 MG tablet Take 1 tablet (5 mg total) by mouth 3 (three) times daily as needed for bladder spasms (Up to 3 times daily to control bladder spasms.). 01/02/14   Lauree Chandler, NP    Family History Family History  Problem Relation Age of Onset  . Cancer Mother   . Cancer Father   . Cancer Brother   . Cancer Daughter   . Leukemia Unknown     Social History Social History   Tobacco Use  . Smoking status: Former Smoker    Packs/day: 2.00    Years: 30.00    Pack years: 60.00    Types: Cigarettes    Last attempt to quit: 01/11/1983    Years since quitting: 34.1  . Smokeless tobacco: Never Used  Substance Use Topics  . Alcohol use: No  . Drug use: No     Allergies   Allegra [fexofenadine hcl]; Levaquin [levofloxacin in d5w]; Tussionex pennkinetic er [employer] er]; Codeine; and Morphine and related   Review of Systems Review of Systems  Gastrointestinal: Negative for vomiting.  Musculoskeletal: Positive for arthralgias and joint swelling.  Neurological: Positive for headaches. Negative for syncope, weakness and numbness.  All other systems reviewed and are negative.    Physical Exam Updated Vital Signs BP 104/76   Pulse (!) 106   Temp 98.4 F (36.9 C) (Oral)   Resp 16    SpO2 96%   Physical Exam  Constitutional: She is oriented to person, place, and time. She appears well-developed and well-nourished. No distress.  HENT:  Head: Normocephalic.  Right Ear: External ear normal.  Left Ear: External ear normal.  Nose: Nose normal.  Small hematoma and tenderness to occiput  Eyes: Right eye exhibits no discharge. Left eye exhibits no discharge.  Cardiovascular: Normal rate, regular rhythm and normal heart sounds.  Pulses:      Dorsalis pedis pulses are 2+  on the right side, and 2+ on the left side.  Pulmonary/Chest: Effort normal and breath sounds normal.  Abdominal: Soft. There is no tenderness.  Musculoskeletal:       Right hip: She exhibits no tenderness.       Right knee: She exhibits no swelling. Tenderness found.       Right upper leg: She exhibits tenderness and swelling.       Legs: Normal strength/sensation in bilateral feet  Neurological: She is alert and oriented to person, place, and time.  Skin: Skin is warm and dry. She is not diaphoretic.  Nursing note and vitals reviewed.    ED Treatments / Results  Labs (all labs ordered are listed, but only abnormal results are displayed) Labs Reviewed  BASIC METABOLIC PANEL - Abnormal; Notable for the following components:      Result Value   Glucose, Bld 135 (*)    BUN 27 (*)    Creatinine, Ser 1.19 (*)    GFR calc non Af Amer 39 (*)    GFR calc Af Amer 45 (*)    All other components within normal limits  CBC WITH DIFFERENTIAL/PLATELET - Abnormal; Notable for the following components:   RBC 3.59 (*)    Hemoglobin 10.9 (*)    HCT 34.9 (*)    Platelets 125 (*)    All other components within normal limits  CBC WITH DIFFERENTIAL/PLATELET  COMPREHENSIVE METABOLIC PANEL  TYPE AND SCREEN    EKG  EKG Interpretation  Date/Time:  Monday March 13 2017 18:53:19 EST Ventricular Rate:  75 PR Interval:    QRS Duration: 98 QT Interval:  385 QTC Calculation: 430 R Axis:   1 Text  Interpretation:  Atrial fibrillation Multiple premature complexes, vent & supraven Borderline short PR interval Borderline low voltage, extremity leads Abnormal R-wave progression, early transition no significant change since Dec 2018 Confirmed by Sherwood Gambler [phone]) on 03/13/2017 7:01:48 PM       Radiology Dg Knee 1-2 Views Right  Result Date: 03/13/2017 CLINICAL DATA:  Fall with right leg deformity.  Initial encounter. EXAM: RIGHT KNEE - 1-2 VIEW COMPARISON:  None. FINDINGS: Comminuted fracture of the right femoral shaft extending from the midportion to the supracondylar femur. On the AP view there is extension to the level of a total knee arthroplasty that is nondisplaced. IMPRESSION: Displaced fracture of the right femoral shaft that extends to the knee arthroplasty on AP view. The arthroplasty hardware is located. Electronically Signed   By: Monte Fantasia M.D.   On: 03/13/2017 19:48   Ct Head Wo Contrast  Result Date: 03/13/2017 CLINICAL DATA:  Tripped and fell.  Hit head. EXAM: CT HEAD WITHOUT CONTRAST TECHNIQUE: Contiguous axial images were obtained from the base of the skull through the vertex without intravenous contrast. COMPARISON:  09/11/2016 FINDINGS: Brain: Stable age related cerebral atrophy, ventriculomegaly and periventricular white matter disease. No extra-axial fluid collections are identified. No CT findings for acute hemispheric infarction or intracranial hemorrhage. No mass lesions. The brainstem and cerebellum are normal. Vascular: Stable vascular calcifications. No hyperdense vessels or aneurysm. Skull: No acute skull or facial bone fractures. Sinuses/Orbits: Chronic left maxillary sinus disease with high attenuation material filling the sinus. This is likely chronic inspissated debris. The mastoid air cells and middle ear cavities are clear. The globes are intact. Other: No scalp lesions or hematoma. IMPRESSION: No acute intracranial findings or skull fracture. Chronic left  maxillary sinus disease. Electronically Signed   By: Ricky Stabs.D.  On: 03/13/2017 19:30   Dg Femur Min 2 Views Right  Result Date: 03/13/2017 CLINICAL DATA:  Right knee deformity due to fall. Initial encounter. EXAM: RIGHT FEMUR 2 VIEWS COMPARISON:  None. FINDINGS: Comminuted fracture of the right femoral shaft extending from the midportion to the supracondylar femur. On the AP view there is extension to the level of a total knee arthroplasty that is nondisplaced. Right hip hemiarthroplasty with lucent appearance around the femoral stem. Located knee and hip joint. IMPRESSION: 1. Displaced fracture of the right femoral shaft reaching the located knee arthroplasty. 2. Right hip hemiarthroplasty with lucency around the femoral stem. Electronically Signed   By: Monte Fantasia M.D.   On: 03/13/2017 19:47    Procedures Procedures (including critical care time)  Medications Ordered in ED Medications  gabapentin (NEURONTIN) capsule 300 mg (not administered)  linagliptin (TRADJENTA) tablet 5 mg (not administered)  metoprolol tartrate (LOPRESSOR) tablet 12.5 mg (12.5 mg Oral Given 03/14/17 0009)  pantoprazole (PROTONIX) EC tablet 40 mg (not administered)  oxybutynin (DITROPAN) tablet 5 mg (not administered)  ursodiol (ACTIGALL) capsule 300 mg (not administered)  insulin aspart (novoLOG) injection 0-9 Units (not administered)  hydrocortisone sodium succinate (SOLU-CORTEF) 100 MG injection 50 mg (50 mg Intravenous Given 03/14/17 0009)     Initial Impression / Assessment and Plan / ED Course  I have reviewed the triage vital signs and the nursing notes.  Pertinent labs & imaging results that were available during my care of the patient were reviewed by me and considered in my medical decision making (see chart for details).     Patient is neurovascularly intact.  She has a femur fracture as above.  Dr. Lorin Mercy performed both her hip and knee surgeries many years ago.  Discussed with his partner,  Dr. Marlou Sa, who asked for patient to be placed in a long-leg splint.  Hold blood thinners, which she has only taken 1 dose of and n.p.o. after midnight in case of surgery.  Hospitalist to admit.  Given head injury, CT head obtained and is negative for acute injury.  Pain has been controlled with pain medicine in the ED.  Final Clinical Impressions(s) / ED Diagnoses   Final diagnoses:  Closed fracture of shaft of right femur, unspecified fracture morphology, initial encounter [facility])    ED Discharge Orders    None       Sherwood Gambler, MD 03/14/17 [phone]

## 2017-03-13 NOTE — ED Notes (Signed)
Ortho called for long leg slpint

## 2017-03-14 ENCOUNTER — Encounter (HOSPITAL_COMMUNITY): Payer: Self-pay | Admitting: Anesthesiology

## 2017-03-14 ENCOUNTER — Inpatient Hospital Stay (HOSPITAL_COMMUNITY): Payer: Medicare Other

## 2017-03-14 LAB — CBC WITH DIFFERENTIAL/PLATELET
Basophils Absolute: 0 10*3/uL (ref 0.0–0.1)
Basophils Relative: 0 %
EOS PCT: 0 %
Eosinophils Absolute: 0 10*3/uL (ref 0.0–0.7)
HEMATOCRIT: 32.6 % — AB (ref 36.0–46.0)
Hemoglobin: 10.3 g/dL — ABNORMAL LOW (ref 12.0–15.0)
LYMPHS ABS: 2.9 10*3/uL (ref 0.7–4.0)
LYMPHS PCT: 24 %
MCH: 30.6 pg (ref 26.0–34.0)
MCHC: 31.6 g/dL (ref 30.0–36.0)
MCV: 96.7 fL (ref 78.0–100.0)
Monocytes Absolute: 1.3 10*3/uL — ABNORMAL HIGH (ref 0.1–1.0)
Monocytes Relative: 11 %
Neutro Abs: 7.5 10*3/uL (ref 1.7–7.7)
Neutrophils Relative %: 65 %
PLATELETS: 154 10*3/uL (ref 150–400)
RBC: 3.37 MIL/uL — AB (ref 3.87–5.11)
RDW: 14.9 % (ref 11.5–15.5)
WBC: 11.7 10*3/uL — AB (ref 4.0–10.5)

## 2017-03-14 LAB — COMPREHENSIVE METABOLIC PANEL
ALK PHOS: 68 U/L (ref 38–126)
ALT: 30 U/L (ref 14–54)
AST: 31 U/L (ref 15–41)
Albumin: 3.3 g/dL — ABNORMAL LOW (ref 3.5–5.0)
Anion gap: 13 (ref 5–15)
BILIRUBIN TOTAL: 1.1 mg/dL (ref 0.3–1.2)
BUN: 29 mg/dL — ABNORMAL HIGH (ref 6–20)
CALCIUM: 9 mg/dL (ref 8.9–10.3)
CHLORIDE: 102 mmol/L (ref 101–111)
CO2: 21 mmol/L — ABNORMAL LOW (ref 22–32)
CREATININE: 1.23 mg/dL — AB (ref 0.44–1.00)
GFR, EST AFRICAN AMERICAN: 43 mL/min — AB (ref >60)
GFR, EST NON AFRICAN AMERICAN: 37 mL/min — AB (ref >60)
Glucose, Bld: 129 mg/dL — ABNORMAL HIGH (ref 65–99)
Potassium: 4.2 mmol/L (ref 3.5–5.1)
Sodium: 136 mmol/L (ref 135–145)
TOTAL PROTEIN: 6.2 g/dL — AB (ref 6.5–8.1)

## 2017-03-14 LAB — URINALYSIS, ROUTINE W REFLEX MICROSCOPIC
BILIRUBIN URINE: NEGATIVE
Glucose, UA: 50 mg/dL — AB
Ketones, ur: 20 mg/dL — AB
NITRITE: NEGATIVE
PROTEIN: NEGATIVE mg/dL
SPECIFIC GRAVITY, URINE: 1.019 (ref 1.005–1.030)
pH: 5 (ref 5.0–8.0)

## 2017-03-14 LAB — SURGICAL PCR SCREEN
MRSA, PCR: NEGATIVE
Staphylococcus aureus: NEGATIVE

## 2017-03-14 LAB — GLUCOSE, CAPILLARY
GLUCOSE-CAPILLARY: 154 mg/dL — AB (ref 65–99)
GLUCOSE-CAPILLARY: 122 mg/dL — AB (ref 65–99)
Glucose-Capillary: 162 mg/dL — ABNORMAL HIGH (ref 65–99)
Glucose-Capillary: 126 mg/dL — ABNORMAL HIGH (ref 65–99)
Glucose-Capillary: 204 mg/dL — ABNORMAL HIGH (ref 65–99)

## 2017-03-14 LAB — PROTIME-INR
INR: 1.28
Prothrombin Time: 15.9 seconds — ABNORMAL HIGH (ref 11.4–15.2)

## 2017-03-14 LAB — APTT: aPTT: 30 seconds (ref 24–36)

## 2017-03-14 MED ORDER — CEFAZOLIN SODIUM-DEXTROSE 2-4 GM/100ML-% IV SOLN
2.0000 g | Freq: Once | INTRAVENOUS | Status: AC
  Administered 2017-03-15: 2 g via INTRAVENOUS
  Filled 2017-03-14: qty 100

## 2017-03-14 MED ORDER — POTASSIUM CHLORIDE IN NACL 20-0.9 MEQ/L-% IV SOLN
INTRAVENOUS | Status: DC
Start: 2017-03-14 — End: 2017-03-16
  Administered 2017-03-14: 19:00:00 via INTRAVENOUS
  Filled 2017-03-14: qty 1000

## 2017-03-14 MED ORDER — CEFAZOLIN (ANCEF) 1 G IV SOLR
2.0000 g | INTRAVENOUS | Status: DC
Start: 2017-03-14 — End: 2017-03-14

## 2017-03-14 MED ORDER — GENERIC EXTERNAL MEDICATION
Status: DC
Start: ? — End: 2017-03-14

## 2017-03-14 MED ORDER — ACETAMINOPHEN 10 MG/ML IV SOLN
1000.0000 mg | Freq: Four times a day (QID) | INTRAVENOUS | Status: AC
  Administered 2017-03-14 – 2017-03-15 (×4): 1000 mg via INTRAVENOUS
  Filled 2017-03-14 (×4): qty 100

## 2017-03-14 MED ORDER — FENTANYL CITRATE (PF) 100 MCG/2ML IJ SOLN
25.0000 ug | INTRAMUSCULAR | Status: DC | PRN
  Administered 2017-03-14 – 2017-03-18 (×15): 25 ug via INTRAVENOUS
  Filled 2017-03-14 (×16): qty 2

## 2017-03-14 MED ORDER — CEFAZOLIN (ANCEF) 1 G IV SOLR: 1.0000 g | INTRAVENOUS | Status: DC

## 2017-03-14 NOTE — Progress Notes (Signed)
[facility] Progress Note  Patient: [name] ZSW:[id number]   PCP: Patient, No Pcp Per DOB: 1924/12/20   DOA: 03/13/2017   DOS: 03/14/2017   Date of Service: the patient was seen and examined on 03/14/2017  Subjective: Pain is well controlled no nausea no vomiting.  Patient denies having any episodes of passing out no fever no chills no burning urination no diarrhea no constipation.  Brief hospital course: Pt. with PMH of A. fib, chronic diastolic CHF, type II DM, PMR, CKD; admitted on 03/13/2017, presented with complaint of fall, was found to have femur fracture. Currently further plan is monitor orthopedic recommendation.  Assessment and Plan: 1.  Right femoral fracture. Mechanical fall. Orthopedic consulted, patient scheduled for operative repair on 03/15/2017. N.p.o. after midnight. Surgery had to be delayed due to patient taking Eliquis prior to admission.  2. Atrial fibrillation CHA2DS2-VASc Score 6 Patient is currently in A. fib at a controlled rate. We'll continue rate control medication on beta-blocker. Eliquis is currently on hold in anticipation of surgery.  3.  Polymyalgia rheumatica. Patient on chronic steroids. Currently on stress dose per surgery.  4.  Chronic kidney disease stage III. Creatinine baseline.  Monitor daily.  5.  Anemia and thrombus cytopenia. Chronic.  H&H relatively stable.  Monitor.  6.  Mechanical fall. No head injury no focal deficit.  Monitor.  7.  Chronic diastolic CHF. Euvolemic.  Lasix on hold.  8.  Type 2 diabetes mellitus. Holding home oral hypoglycemic agent. Patient is on sliding scale. Monitor.  Diet: Cardiac and carb modified diet, n.p.o. after midnight DVT Prophylaxis: mechanical compression device  Advance goals of care discussion: full code  Family Communication: no family was present at bedside, at the time of interview.  Disposition:  Discharge to be determined.   Consultants: orthoe Procedures:  none  Antibiotics: Anti-infectives (From admission, onward)   Start     Dose/Rate Route Frequency Ordered Stop   03/14/17 1600  ceFAZolin (ANCEF) powder 2 g  Status:  Discontinued     2 g Other To Surgery 03/14/17 1218 03/14/17 1241   03/14/17 1600  ceFAZolin (ANCEF) IVPB 2g/100 mL premix     2 g 200 mL/hr over 30 Minutes Intravenous  Once 03/14/17 1240     03/14/17 1245  ceFAZolin (ANCEF) powder 1 g  Status:  Discontinued     1 g Other To Surgery 03/14/17 1235 03/14/17 1236       Objective: Physical Exam: Vitals:   03/14/17 0110 03/14/17 0549 03/14/17 1040 03/14/17 1229  BP:  105/66  (!) 113/53  Pulse: 77 73  93  Resp:  16  16  Temp:  97.8 F (36.6 C)  98.2 F (36.8 C)  TempSrc:  Oral  Oral  SpO2:  95%  98%  Weight:   65.3 kg (144 lb)   Height:   5\' 1"  (1.549 m)     Intake/Output Summary (Last 24 hours) at 03/14/2017 1606 Last data filed at 03/14/2017 1229 Gross per 24 hour  Intake 0 ml  Output -  Net 0 ml   Filed Weights   03/14/17 1040  Weight: 65.3 kg (144 lb)   General: Alert, Awake and Oriented to Time, Place and Person. Appear in mild distress, affect appropriate Eyes: PERRL, Conjunctiva normal ENT: Oral Mucosa clear moist. Neck: no JVD, no Abnormal Mass Or lumps Cardiovascular: S1 and S2 Present, no Murmur, Peripheral Pulses Present Respiratory: normal respiratory effort, Bilateral Air entry equal and Decreased, no use of accessory  muscle, Clear to Auscultation, no Crackles, no wheezes Abdomen: Bowel Sound present, Soft and no tenderness, no hernia Skin: no redness, no Rash, no induration Extremities: no Pedal edema, no calf tenderness Neurologic: Grossly no focal neuro deficit. Bilaterally Equal motor strength  Data Reviewed: CBC: Recent Labs  Lab 03/13/17 2010 03/14/17 1120  WBC 10.0 11.7*  NEUTROABS 7.2 7.5  HGB 10.9* 10.3*  HCT 34.9* 32.6*  MCV 97.2 96.7  PLT 125* 644   Basic Metabolic Panel: Recent Labs  Lab 03/13/17 2010 03/14/17 1120   NA 135 136  K 4.8 4.2  CL 105 102  CO2 22 21*  GLUCOSE 135* 129*  BUN 27* 29*  CREATININE 1.19* 1.23*  CALCIUM 9.1 9.0    Liver Function Tests: Recent Labs  Lab 03/14/17 1120  AST 31  ALT 30  ALKPHOS 68  BILITOT 1.1  PROT 6.2*  ALBUMIN 3.3*   No results for input(s): LIPASE, AMYLASE in the last 168 hours. No results for input(s): AMMONIA in the last 168 hours. Coagulation Profile: Recent Labs  Lab 03/14/17 1120  INR 1.28   Cardiac Enzymes: No results for input(s): CKTOTAL, CKMB, CKMBINDEX, TROPONINI in the last 168 hours. BNP (last 3 results) No results for input(s): PROBNP in the last 8760 hours. CBG: Recent Labs  Lab 03/14/17 0555 03/14/17 0701 03/14/17 1222  GLUCAP 162* 154* 122*   Studies: Dg Knee 1-2 Views Right  Result Date: 03/13/2017 CLINICAL DATA:  Fall with right leg deformity.  Initial encounter. EXAM: RIGHT KNEE - 1-2 VIEW COMPARISON:  None. FINDINGS: Comminuted fracture of the right femoral shaft extending from the midportion to the supracondylar femur. On the AP view there is extension to the level of a total knee arthroplasty that is nondisplaced. IMPRESSION: Displaced fracture of the right femoral shaft that extends to the knee arthroplasty on AP view. The arthroplasty hardware is located. Electronically Signed   By: Monte Fantasia M.D.   On: 03/13/2017 19:48   Ct Head Wo Contrast  Result Date: 03/13/2017 CLINICAL DATA:  Tripped and fell.  Hit head. EXAM: CT HEAD WITHOUT CONTRAST TECHNIQUE: Contiguous axial images were obtained from the base of the skull through the vertex without intravenous contrast. COMPARISON:  09/11/2016 FINDINGS: Brain: Stable age related cerebral atrophy, ventriculomegaly and periventricular white matter disease. No extra-axial fluid collections are identified. No CT findings for acute hemispheric infarction or intracranial hemorrhage. No mass lesions. The brainstem and cerebellum are normal. Vascular: Stable vascular  calcifications. No hyperdense vessels or aneurysm. Skull: No acute skull or facial bone fractures. Sinuses/Orbits: Chronic left maxillary sinus disease with high attenuation material filling the sinus. This is likely chronic inspissated debris. The mastoid air cells and middle ear cavities are clear. The globes are intact. Other: No scalp lesions or hematoma. IMPRESSION: No acute intracranial findings or skull fracture. Chronic left maxillary sinus disease. Electronically Signed   By: Marijo Sanes M.D.   On: 03/13/2017 19:30   Dg Chest Port 1 View  Result Date: 03/14/2017 CLINICAL DATA:  Preoperative examination prior to right hip surgery EXAM: PORTABLE CHEST 1 VIEW COMPARISON:  PA and lateral chest x-ray of February 10, 2017 FINDINGS: The lungs are well-expanded. There is no focal infiltrate. There is no pleural effusion. The heart and pulmonary vascularity are normal. There calcification in the wall of the thoracic aorta. There is no pleural effusion. The bony thorax exhibits no acute abnormality. IMPRESSION: There is no active cardiopulmonary disease. Thoracic aortic atherosclerosis. Electronically Signed   By: Shanon Brow  [place] M.D.   On: 03/14/2017 07:28   Dg Knee Left Port  Result Date: 03/14/2017 CLINICAL DATA:  Left knee pain. History of prior total left knee arthroplasty. EXAM: PORTABLE LEFT KNEE - 1-2 VIEW COMPARISON:  08/08/2007 FINDINGS: The femoral and tibial components are well seated. No complicating features are identified. No joint effusion. No fracture. Moderate vascular calcifications. IMPRESSION: The total left knee arthroplasty components are intact without complicating features. No acute bony findings. Electronically Signed   By: Marijo Sanes M.D.   On: 03/14/2017 12:36   Dg Femur Min 2 Views Right  Result Date: 03/13/2017 CLINICAL DATA:  Right knee deformity due to fall. Initial encounter. EXAM: RIGHT FEMUR 2 VIEWS COMPARISON:  None. FINDINGS: Comminuted fracture of the right femoral  shaft extending from the midportion to the supracondylar femur. On the AP view there is extension to the level of a total knee arthroplasty that is nondisplaced. Right hip hemiarthroplasty with lucent appearance around the femoral stem. Located knee and hip joint. IMPRESSION: 1. Displaced fracture of the right femoral shaft reaching the located knee arthroplasty. 2. Right hip hemiarthroplasty with lucency around the femoral stem. Electronically Signed   By: Monte Fantasia M.D.   On: 03/13/2017 19:47    Scheduled Meds: . gabapentin  300 mg Oral TID  . hydrocortisone sod succinate (SOLU-CORTEF) inj  50 mg Intravenous Q12H  . insulin aspart  0-9 Units Subcutaneous TID WC  . linagliptin  5 mg Oral Daily  . metoprolol tartrate  12.5 mg Oral BID  . pantoprazole  40 mg Oral Daily  . ursodiol  300 mg Oral BID   Continuous Infusions: . 0.9 % NaCl with KCl 20 mEq / L    . acetaminophen Stopped (03/14/17 1445)  .  ceFAZolin (ANCEF) IV     PRN Meds: fentaNYL (SUBLIMAZE) injection, oxybutynin  Time spent: 35 minutes  Author: Berle Mull, MD [facility] Pager: [phone] 03/14/2017 4:06 PM  If 7PM-7AM, please contact night-coverage at [email or web address], password [facility]

## 2017-03-14 NOTE — H&P (View-Only) (Signed)
Orthopaedic Trauma Service (OTS) Consult   Patient ID: AILIN ROCHFORD MRN: [id number] DOB/AGE: [id number] 82 y.o.   Reason for Consult: Right periprosthetic femur fracture, right total hip and right total knee arthroplasties Referring Physician: Meredith Pel, MD   HPI: JUANETTA NEGASH is an 84 y.o. female who sustained a ground-level fall yesterday at home in her apartment.  She was walking back into her bedroom when she fell.  She fell on her right leg.  Patient had immediate onset of pain in her right leg with deformity.  She was unable to get up or bear weight.  She does have a medical history notable for CHF, diabetes mellitus (no insulin), polymyalgia rheumatica, chronic kidney disease who was recently hospitalized about a week ago for UTI and has been on antibiotics since (Cipro).  She is also seen in cardiology office yesterday for atrial fibrillation and was started on Eliquis.  She had one dose prior to her fall.  Patient was brought to [facility] for evaluation where she was found to have periprosthetic femur fracture.  Patient has a right total hip arthroplasty as well as a right total knee arthroplasty.  Patient was admitted to the medical service given her chronic medical issues.  Orthopedic service was consulted for definitive management of her fracture.  Patient is a longtime patient of [facility] however due to the complexity of the injury the orthopedic trauma service was consulted for definitive management.  It was felt that the particular fracture pattern required expertise of an orthopedic trauma fellowship trained surgeon.  Patient seen and evaluated in room 5 N. 12.  Granddaughter is at bedside.  Patient is comfortable and her right leg is in a long-leg.  It is externally rotated.  Pain is controlled with pain medication.  She denies any additional injuries elsewhere.  Patient does live alone in an apartment which is on the first floor.  Granddaughter states  that patient's activity level is quite minimal.  Patient likes to lay down a lot but does walk in the apartment.  She does not really do any exercises outside.  She does use a cane to assist with ambulation.  Patient does very little cooking and cleaning in the apartment.  She does have numerous family members that live nearby that assist with ADLs.  I did discuss with the patient possibility of needing a rehab center after her acute hospital stay.  She was quite adamant about discharging home.  Past Medical History:  Diagnosis Date  . Abnormality of gait   . Anemia, unspecified   . Anxiety state, unspecified   . Arthritis    BACK , NECK, AND JOINTS  . 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 CHF (congestive heart failure) ([place])   . Chronic cystitis   . Chronic steroid use   . COPD (chronic obstructive pulmonary disease) (H[facility])    no inhalers used   . Depressive disorder, not elsewhere classified   . Dermatophytosis of nail   . Diabetic peripheral neuropathy ([place])   . Diaphragmatic hernia without mention of obstruction or gangrene   . Hemoperitoneum (nontraumatic)   . Herpes zoster with other nervous system complications(053.19)   . History of TIA (transient ischemic attack) 2012--  NO RESIDUAL  . HTN (hypertension)   . 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 Insulin dependent diabetes mellitus ([name])   . Macular degeneration of both eyes   . Malignant neoplasm of renal pelvis ([place])   . Osteoporosis   .  Osteoporosis, unspecified   . Other and unspecified hyperlipidemia   . Personality change due to conditions classified elsewhere   . PONV (postoperative nausea and vomiting)    in past, none recently  . 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 Temporal arteritis ([place])   . Type I (juvenile type) diabetes mellitus without mention of complication, uncontrolled   . Unspecified essential hypertension   . Unspecified late effects of  cerebrovascular disease   . Urinary frequency     Past Surgical History:  Procedure Laterality Date  . ANTERIOR CERVICAL DECOMP/DISCECTOMY FUSION  10-12-2005   C5 -- C7  . CARPAL TUNNEL RELEASE Right 2009  (APPROX)  . CATARACT EXTRACTION W/ INTRAOCULAR LENS  IMPLANT, BILATERAL Right Oct 1992   Epes  . CATARACT EXTRACTION W/ INTRAOCULAR LENS IMPLANT Left 1993   Epes  . CRYOABLATION RIGHT RENAL MASS  03-16-2012   ONCOCYTOMA (POST OP 06-09-2012 PERCUTANEOUS DRAIN PLACEMENT FOR PERINEPHRIC ABSCESS)  . CYSTOSCOPY W/ URETERAL STENT PLACEMENT Right 07/10/2012   Procedure: CYSTOSCOPY WITH RIGHT RETROGRADE PYELOGRAM/URETERAL STENT PLACEMENT;  Surgeon: Malka So, MD;  [facility];  Service: Urology;  Laterality: Right;  . ELBOW SURGERY Bilateral 2003  (APPROX)  . HIP ARTHROPLASTY Right 08/03/2012   Procedure: CEMENTED MONOPOLAR HIP;  Surgeon: Marybelle Killings, MD;  Location: [facility];  Service: Orthopedics;  Laterality: Right;  . LUMBAR LAMINECTOMY  11-19-2003   L3 -- L4  . ORIF W/ PLATE RIGHT DISTAL RADIUS FX  06-18-2003  . POSTERIOR FUSION CERVICAL SPINE  02-10-2006   C5 -- C7  . TEMPORAL ARTERY BIOPSY / LIGATION    . TOTAL HIP ARTHROPLASTY Left 01-22-2005  . TOTAL KNEE ARTHROPLASTY Bilateral LEFT 08-08-2007;  RIGHT 05-08-2009    Family History  Problem Relation Age of Onset  . Cancer Mother   . Cancer Father   . Cancer Brother   . Cancer Daughter   . Leukemia Unknown     Social History:  reports that she quit smoking about 34 years ago. Her smoking use included cigarettes. She has a 60.00 pack-year smoking history. she has never used smokeless tobacco. She reports that she does not drink alcohol or use drugs.  Allergies:  Allergies  Allergen Reactions  . Allegra [Fexofenadine Hcl]     Unknown reaction  . Levaquin [Levofloxacin In D5w] Other (See Comments)    Unknown reaction  . Tussionex Pennkinetic Er [Hydrocod Polst-Cpm Polst Er] Nausea And Vomiting  .  Codeine Nausea And Vomiting  . Morphine And Related Nausea And Vomiting    Medications: I have reviewed the patient's current medications.  Current Meds  Medication Sig  . apixaban (ELIQUIS) 5 MG TABS tablet Take 1 tablet (5 mg total) by mouth 2 (two) times daily.  . ciprofloxacin (CIPRO) 500 MG tablet Take 500 mg by mouth 2 (two) times daily.  . feeding supplement, GLUCERNA SHAKE, (GLUCERNA SHAKE) LIQD Take 237 mLs by mouth 2 (two) times daily between meals.  . gabapentin (NEURONTIN) 300 MG capsule Take 1 capsule (300 mg total) by mouth 3 (three) times daily. Taking 1 tab in the am , and 2 tabs in the pm.  . glimepiride (AMARYL) 2 MG tablet Take 2 mg by mouth daily with breakfast.  . linagliptin (TRADJENTA) 5 MG TABS tablet Take 1 tablet (5 mg total) by mouth daily.  . meloxicam (MOBIC) 15 MG tablet Take 1 tablet (15 mg total) by mouth daily.  Marland Kitchen  metoprolol tartrate (LOPRESSOR) 25 MG tablet Take 0.5 tablets (12.5 mg total) by mouth 3 (three) times a week. On Monday, Wednesday and Friday (Patient taking differently: Take 12.5 mg by mouth 2 (two) times daily. On Monday, Wednesday and Friday)  . metroNIDAZOLE (FLAGYL) 500 MG tablet Take 500 mg by mouth 3 (three) times daily.  Marland Kitchen omeprazole (PRILOSEC) 20 MG capsule Take 1 capsule (20 mg total) by mouth daily.  . predniSONE (DELTASONE) 1 MG tablet TAKE THREE TABLETS BY MOUTH ONCE DAILY IN THE MORNING (Patient taking differently: TAKE 3 mg TABLETS BY MOUTH ONCE DAILY IN THE MORNING)  . ursodiol (ACTIGALL) 300 MG capsule Take 150 mg by mouth 2 (two) times daily.     Results for orders placed or performed during the hospital encounter of 03/13/17 (from the past 48 hour(s))  Basic metabolic panel     Status: Abnormal   Collection Time: 03/13/17  8:10 PM  Result Value Ref Range   Sodium 135 135 - 145 mmol/L   Potassium 4.8 3.5 - 5.1 mmol/L   Chloride 105 101 - 111 mmol/L   CO2 22 22 - 32 mmol/L   Glucose, Bld 135 (H) 65 - 99 mg/dL   BUN 27 (H) 6  - 20 mg/dL   Creatinine, Ser 1.19 (H) 0.44 - 1.00 mg/dL   Calcium 9.1 8.9 - 10.3 mg/dL   GFR calc non Af Amer 39 (L) >60 mL/min   GFR calc Af Amer 45 (L) >60 mL/min    Comment: (NOTE) The eGFR has been calculated using the CKD EPI equation. This calculation has not been validated in all clinical situations. eGFR's persistently <60 mL/min signify possible Chronic Kidney Disease.    Anion gap 8 5 - 15  CBC with Differential     Status: Abnormal   Collection Time: 03/13/17  8:10 PM  Result Value Ref Range   WBC 10.0 4.0 - 10.5 K/uL   RBC 3.59 (L) 3.87 - 5.11 MIL/uL   Hemoglobin 10.9 (L) 12.0 - 15.0 g/dL   HCT 34.9 (L) 36.0 - 46.0 %   MCV 97.2 78.0 - 100.0 fL   MCH 30.4 26.0 - 34.0 pg   MCHC 31.2 30.0 - 36.0 g/dL   RDW 14.7 11.5 - 15.5 %   Platelets 125 (L) 150 - 400 K/uL   Neutrophils Relative % 71 %   Neutro Abs 7.2 1.7 - 7.7 K/uL   Lymphocytes Relative 18 %   Lymphs Abs 1.8 0.7 - 4.0 K/uL   Monocytes Relative 10 %   Monocytes Absolute 1.0 0.1 - 1.0 K/uL   Eosinophils Relative 1 %   Eosinophils Absolute 0.1 0.0 - 0.7 K/uL   Basophils Relative 0 %   Basophils Absolute 0.0 0.0 - 0.1 K/uL  Type and screen [place]     Status: None   Collection Time: 03/14/17 12:23 AM  Result Value Ref Range   ABO/RH(D) A NEG    Antibody Screen NEG    Sample Expiration 03/17/2017   Glucose, capillary     Status: Abnormal   Collection Time: 03/14/17  5:55 AM  Result Value Ref Range   Glucose-Capillary 162 (H) 65 - 99 mg/dL  Glucose, capillary     Status: Abnormal   Collection Time: 03/14/17  7:01 AM  Result Value Ref Range   Glucose-Capillary 154 (H) 65 - 99 mg/dL  Urinalysis, Routine w reflex microscopic     Status: Abnormal   Collection Time: 03/14/17 10:24 AM  Result  Value Ref Range   Color, Urine YELLOW YELLOW   APPearance CLEAR CLEAR   Specific Gravity, Urine 1.019 1.005 - 1.030   pH 5.0 5.0 - 8.0   Glucose, UA 50 (A) NEGATIVE mg/dL   Hgb urine dipstick SMALL  (A) NEGATIVE   Bilirubin Urine NEGATIVE NEGATIVE   Ketones, ur 20 (A) NEGATIVE mg/dL   Protein, ur NEGATIVE NEGATIVE mg/dL   Nitrite NEGATIVE NEGATIVE   Leukocytes, UA TRACE (A) NEGATIVE   RBC / HPF 0-5 0 - 5 RBC/hpf   WBC, UA 0-5 0 - 5 WBC/hpf   Bacteria, UA MANY (A) NONE SEEN   Squamous Epithelial / LPF 0-5 (A) NONE SEEN     CBC, PT/INR, aptt and CMET are pending for this morning  Dg Knee 1-2 Views Right  Result Date: 03/13/2017 CLINICAL DATA:  Fall with right leg deformity.  Initial encounter. EXAM: RIGHT KNEE - 1-2 VIEW COMPARISON:  None. FINDINGS: Comminuted fracture of the right femoral shaft extending from the midportion to the supracondylar femur. On the AP view there is extension to the level of a total knee arthroplasty that is nondisplaced. IMPRESSION: Displaced fracture of the right femoral shaft that extends to the knee arthroplasty on AP view. The arthroplasty hardware is located. Electronically Signed   By: Monte Fantasia M.D.   On: 03/13/2017 19:48   Ct Head Wo Contrast  Result Date: 03/13/2017 CLINICAL DATA:  Tripped and fell.  Hit head. EXAM: CT HEAD WITHOUT CONTRAST TECHNIQUE: Contiguous axial images were obtained from the base of the skull through the vertex without intravenous contrast. COMPARISON:  09/11/2016 FINDINGS: Brain: Stable age related cerebral atrophy, ventriculomegaly and periventricular white matter disease. No extra-axial fluid collections are identified. No CT findings for acute hemispheric infarction or intracranial hemorrhage. No mass lesions. The brainstem and cerebellum are normal. Vascular: Stable vascular calcifications. No hyperdense vessels or aneurysm. Skull: No acute skull or facial bone fractures. Sinuses/Orbits: Chronic left maxillary sinus disease with high attenuation material filling the sinus. This is likely chronic inspissated debris. The mastoid air cells and middle ear cavities are clear. The globes are intact. Other: No scalp lesions or  hematoma. IMPRESSION: No acute intracranial findings or skull fracture. Chronic left maxillary sinus disease. Electronically Signed   By: Marijo Sanes M.D.   On: 03/13/2017 19:30   Dg Chest Port 1 View  Result Date: 03/14/2017 CLINICAL DATA:  Preoperative examination prior to right hip surgery EXAM: PORTABLE CHEST 1 VIEW COMPARISON:  PA and lateral chest x-ray of February 10, 2017 FINDINGS: The lungs are well-expanded. There is no focal infiltrate. There is no pleural effusion. The heart and pulmonary vascularity are normal. There calcification in the wall of the thoracic aorta. There is no pleural effusion. The bony thorax exhibits no acute abnormality. IMPRESSION: There is no active cardiopulmonary disease. Thoracic aortic atherosclerosis. Electronically Signed   By: David  Martinique M.D.   On: 03/14/2017 07:28   Dg Femur Min 2 Views Right  Result Date: 03/13/2017 CLINICAL DATA:  Right knee deformity due to fall. Initial encounter. EXAM: RIGHT FEMUR 2 VIEWS COMPARISON:  None. FINDINGS: Comminuted fracture of the right femoral shaft extending from the midportion to the supracondylar femur. On the AP view there is extension to the level of a total knee arthroplasty that is nondisplaced. Right hip hemiarthroplasty with lucent appearance around the femoral stem. Located knee and hip joint. IMPRESSION: 1. Displaced fracture of the right femoral shaft reaching the located knee arthroplasty. 2. Right hip hemiarthroplasty  with lucency around the femoral stem. Electronically Signed   By: Monte Fantasia M.D.   On: 03/13/2017 19:47    Review of Systems  Constitutional: Negative for chills and fever.  Respiratory: Negative for shortness of breath and wheezing.   Cardiovascular: Negative for chest pain and palpitations.  Gastrointestinal: Negative for abdominal pain, nausea and vomiting.  Genitourinary:       Urine wick  Musculoskeletal:       Right thigh pain  Neurological: Negative for tingling and sensory  change.   Blood pressure 105/66, pulse 73, temperature 97.8 F (36.6 C), temperature source Oral, resp. rate 16, height '5\' 1"'  (1.549 m), weight 65.3 kg (144 lb), SpO2 95 %. Physical Exam  Constitutional: She is cooperative.  Pleasant white female, no acute distress, interactive, answers questions appropriately.  A little hard of hearing  HENT:  Head: Normocephalic and atraumatic.  Mouth/Throat: Mucous membranes are dry.  Cardiovascular: An irregularly irregular rhythm present.  Pulmonary/Chest: No accessory muscle usage. No respiratory distress.  Clear anterior fields  Abdominal: Soft. Bowel sounds are normal. There is no tenderness.  Musculoskeletal:  Pelvis   no traumatic wounds or rash, no ecchymosis, stable to manual stress, nontender  Right lower extremity Inspection: Right leg is in a posterior long-leg splint, did not remove splint as fracture is reportedly closed.  No blood or oozing noted on the splint dressing Leg is externally rotated Mild swelling to the right leg noted  Bony eval: Tenderness palpation mid thigh No hip tenderness, no lower leg tenderness.  No ankle or foot tenderness Soft tissue: Swelling distal right lower leg Surgical wound right hip well-healed No open wounds or lesions ROM: I did not assess hip knee or ankle range of motion as the patient is in a posterior long-leg splint Patient is able to wiggle her toes Sensation: DPN, SPN, TN sensory functions are grossly intact Motor: EHL, FHL, lesser toe motor functions are grossly Vascular: + DP pulse Extremity is warm Compartments are soft  Left lower extremity Inspection: No gross deformities Midline surgical scar over left knee well-healed Mild swelling proximal tibia No ecchymosis Bony eval: No pain or discomfort with axial loading or logrolling of the hip Distal femur is nontender Patient does have some mild discomfort and pain with palpation of her proximal tibia.  No crepitus or gross  motion noted at the tibia with manipulation of her knee Soft tissue: Mild swelling proximal tibia No open wounds or lesions otherwise noted No significant swelling to the left leg ROM: Full ankle range of motion noted Patient tolerates gentle knee range of motion Sensation: DPN, SPN, TN sensory functions are grossly intact Motor: EHL, FHL, anterior tibialis, posterior tibialis, peroneals and gastrocsoleus complex motor functions are grossly intact Vascular: Extremity is warm + DP pulse Compartments are soft, no pain with passive stretching  Bilateral upper extremities  shoulder, elbow, wrist, digits- no skin wounds, nontender, no instability, no blocks to motion  Sens  Ax/R/M/U intact  Mot   Ax/ R/ PIN/ M/ AIN/ U intact  Rad 2+    Neurological: She is alert.  Psychiatric: She has a normal mood and affect. Her speech is normal and behavior is normal.  Nursing note and vitals reviewed.    Assessment/Plan:  84 year old white female ground-level fall with right periprosthetic femur fracture  -Fall  -Right periprosthetic femur fracture  Even though patient activity is somewhat minimal to feel that surgical intervention be the best option to afford the patient to get back  to her somewhat independent living status.  Patient does live by herself but does require a fair amount of assistance with ADLs.  I do feel that it would be very difficult to treat her fracture nonoperatively with all of her medical comorbidities as well as the cumbersome nature of immobilization for femur fracture.  At least with surgical fixation she would be able to mobilize with high level of comfort with assistance   Discussed surgical and nonsurgical management with patient and granddaughter.  They are in agreement with surgical intervention   Hopeful for OR later this afternoon may need to delay until tomorrow due to OR availability.   Check CBC, CMET, coag panel now   we should be able to fix the  fracture with minimally invasive techniques which should curb blood loss.  However given the fact that this is a femur fracture she may require blood because of the injury.  This was discussed with patient and granddaughter as well   Highly suspect that the patient will need a skilled nursing center at discharge as she does live alone.  She is not thrilled with this idea as she is quite adamant about returning home on discharge.  We will see how patient progresses with therapy remote possibility that we could consider inpatient rehab.  Could consider allowing the patient to be weightbearing as tolerated postoperatively with the use of walker which could increase her level of independence more quickly.  However her bone quality is likely suboptimal and will require a period of modified weightbearing  - Left knee tenderness (proximal tibia)  Check xrays   - Pain management:  Continue with current regimen  - ABL anemia/Hemodynamics  CBC is pending  - Medical issues   Per medical service  - DVT/PE prophylaxis:  Can restart Eliquis postoperatively  - Metabolic Bone Disease:  Check vitamin D levels  - Activity:  Bedrest for now  Decubitus precautions  - FEN/GI prophylaxis/Foley/Lines:  Wean off of urine wick as soon as possible  - Dispo:  Hopeful for OR later this afternoon versus tomorrow   Jari Pigg, PA-C [facility] [phone] (C) [phone] (O) 03/14/2017, 11:56 AM

## 2017-03-14 NOTE — Consult Note (Signed)
Orthopaedic Trauma Service (OTS) Consult   Patient ID: [name] MRN: [id number] DOB/AGE: January 06, 1925 84 y.o.   Reason for Consult: Right periprosthetic femur fracture, right total hip and right total knee arthroplasties Referring Physician: Meredith Pel, MD   HPI: [name] is an 84 y.o. female who sustained a ground-level fall yesterday at home in her apartment.  She was walking back into her bedroom when she fell.  She fell on her right leg.  Patient had immediate onset of pain in her right leg with deformity.  She was unable to get up or bear weight.  She does have a medical history notable for CHF, diabetes mellitus (no insulin), polymyalgia rheumatica, chronic kidney disease who was recently hospitalized about a week ago for UTI and has been on antibiotics since (Cipro).  She is also seen in cardiology office yesterday for atrial fibrillation and was started on Eliquis.  She had one dose prior to her fall.  Patient was brought to [facility] for evaluation where she was found to have periprosthetic femur fracture.  Patient has a right total hip arthroplasty as well as a right total knee arthroplasty.  Patient was admitted to the medical service given her chronic medical issues.  Orthopedic service was consulted for definitive management of her fracture.  Patient is a longtime patient of [facility] however due to the complexity of the injury the orthopedic trauma service was consulted for definitive management.  It was felt that the particular fracture pattern required expertise of an orthopedic trauma fellowship trained surgeon.  Patient seen and evaluated in room 5 N. 12.  Granddaughter is at bedside.  Patient is comfortable and her right leg is in a long-leg.  It is externally rotated.  Pain is controlled with pain medication.  She denies any additional injuries elsewhere.  Patient does live alone in an apartment which is on the first floor.  Granddaughter states  that patient's activity level is quite minimal.  Patient likes to lay down a lot but does walk in the apartment.  She does not really do any exercises outside.  She does use a cane to assist with ambulation.  Patient does very little cooking and cleaning in the apartment.  She does have numerous family members that live nearby that assist with ADLs.  I did discuss with the patient possibility of needing a rehab center after her acute hospital stay.  She was quite adamant about discharging home.  Past Medical History:  Diagnosis Date  . Abnormality of gait   . Anemia, unspecified   . Anxiety state, unspecified   . Arthritis    BACK , NECK, AND JOINTS  . 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 CHF (congestive heart failure) ([place])   . Chronic cystitis   . Chronic steroid use   . COPD (chronic obstructive pulmonary disease) (HCC)    no inhalers used   . Depressive disorder, not elsewhere classified   . Dermatophytosis of nail   . Diabetic peripheral neuropathy ([place])   . Diaphragmatic hernia without mention of obstruction or gangrene   . Hemoperitoneum (nontraumatic)   . Herpes zoster with other nervous system complications(053.19)   . History of TIA (transient ischemic attack) 2012--  NO RESIDUAL  . HTN (hypertension)   . 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 Insulin dependent diabetes mellitus ([place])   . Macular degeneration of both eyes   . Malignant neoplasm of renal pelvis ([place])   . Osteoporosis   .  Osteoporosis, unspecified   . Other and unspecified hyperlipidemia   . Personality change due to conditions classified elsewhere   . PONV (postoperative nausea and vomiting)    in past, none recently  . 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 Temporal arteritis ([place])   . Type I (juvenile type) diabetes mellitus without mention of complication, uncontrolled   . Unspecified essential hypertension   . Unspecified late effects of  cerebrovascular disease   . Urinary frequency     Past Surgical History:  Procedure Laterality Date  . ANTERIOR CERVICAL DECOMP/DISCECTOMY FUSION  10-12-2005   C5 -- C7  . CARPAL TUNNEL RELEASE Right 2009  (APPROX)  . CATARACT EXTRACTION W/ INTRAOCULAR LENS  IMPLANT, BILATERAL Right Oct 1992   Epes  . CATARACT EXTRACTION W/ INTRAOCULAR LENS IMPLANT Left 1993   Epes  . CRYOABLATION RIGHT RENAL MASS  03-16-2012   ONCOCYTOMA (POST OP 06-09-2012 PERCUTANEOUS DRAIN PLACEMENT FOR PERINEPHRIC ABSCESS)  . CYSTOSCOPY W/ URETERAL STENT PLACEMENT Right 07/10/2012   Procedure: CYSTOSCOPY WITH RIGHT RETROGRADE PYELOGRAM/URETERAL STENT PLACEMENT;  Surgeon: Malka So, MD;  Location: [facility];  Service: Urology;  Laterality: Right;  . ELBOW SURGERY Bilateral 2003  (APPROX)  . HIP ARTHROPLASTY Right 08/03/2012   Procedure: CEMENTED MONOPOLAR HIP;  Surgeon: Marybelle Killings, MD;  Location: [facility];  Service: Orthopedics;  Laterality: Right;  . LUMBAR LAMINECTOMY  11-19-2003   L3 -- L4  . ORIF W/ PLATE RIGHT DISTAL RADIUS FX  06-18-2003  . POSTERIOR FUSION CERVICAL SPINE  02-10-2006   C5 -- C7  . TEMPORAL ARTERY BIOPSY / LIGATION    . TOTAL HIP ARTHROPLASTY Left 01-22-2005  . TOTAL KNEE ARTHROPLASTY Bilateral LEFT 08-08-2007;  RIGHT 05-08-2009    Family History  Problem Relation Age of Onset  . Cancer Mother   . Cancer Father   . Cancer Brother   . Cancer Daughter   . Leukemia Unknown     Social History:  reports that she quit smoking about 34 years ago. Her smoking use included cigarettes. She has a 60.00 pack-year smoking history. she has never used smokeless tobacco. She reports that she does not drink alcohol or use drugs.  Allergies:  Allergies  Allergen Reactions  . Allegra [Fexofenadine Hcl]     Unknown reaction  . Levaquin [Levofloxacin In D5w] Other (See Comments)    Unknown reaction  . Tussionex Pennkinetic Er [Hydrocod Polst-Cpm Polst Er] Nausea And Vomiting  .  Codeine Nausea And Vomiting  . Morphine And Related Nausea And Vomiting    Medications: I have reviewed the patient's current medications.  Current Meds  Medication Sig  . apixaban (ELIQUIS) 5 MG TABS tablet Take 1 tablet (5 mg total) by mouth 2 (two) times daily.  . ciprofloxacin (CIPRO) 500 MG tablet Take 500 mg by mouth 2 (two) times daily.  . feeding supplement, GLUCERNA SHAKE, (GLUCERNA SHAKE) LIQD Take 237 mLs by mouth 2 (two) times daily between meals.  . gabapentin (NEURONTIN) 300 MG capsule Take 1 capsule (300 mg total) by mouth 3 (three) times daily. Taking 1 tab in the am , and 2 tabs in the pm.  . glimepiride (AMARYL) 2 MG tablet Take 2 mg by mouth daily with breakfast.  . linagliptin (TRADJENTA) 5 MG TABS tablet Take 1 tablet (5 mg total) by mouth daily.  . meloxicam (MOBIC) 15 MG tablet Take 1 tablet (15 mg total) by mouth daily.  Marland Kitchen  metoprolol tartrate (LOPRESSOR) 25 MG tablet Take 0.5 tablets (12.5 mg total) by mouth 3 (three) times a week. On Monday, Wednesday and Friday (Patient taking differently: Take 12.5 mg by mouth 2 (two) times daily. On Monday, Wednesday and Friday)  . metroNIDAZOLE (FLAGYL) 500 MG tablet Take 500 mg by mouth 3 (three) times daily.  Marland Kitchen omeprazole (PRILOSEC) 20 MG capsule Take 1 capsule (20 mg total) by mouth daily.  . predniSONE (DELTASONE) 1 MG tablet TAKE THREE TABLETS BY MOUTH ONCE DAILY IN THE MORNING (Patient taking differently: TAKE 3 mg TABLETS BY MOUTH ONCE DAILY IN THE MORNING)  . ursodiol (ACTIGALL) 300 MG capsule Take 150 mg by mouth 2 (two) times daily.     Results for orders placed or performed during the hospital encounter of 03/13/17 (from the past 48 hour(s))  Basic metabolic panel     Status: Abnormal   Collection Time: 03/13/17  8:10 PM  Result Value Ref Range   Sodium 135 135 - 145 mmol/L   Potassium 4.8 3.5 - 5.1 mmol/L   Chloride 105 101 - 111 mmol/L   CO2 22 22 - 32 mmol/L   Glucose, Bld 135 (H) 65 - 99 mg/dL   BUN 27 (H) 6  - 20 mg/dL   Creatinine, Ser 1.19 (H) 0.44 - 1.00 mg/dL   Calcium 9.1 8.9 - 10.3 mg/dL   GFR calc non Af Amer 39 (L) >60 mL/min   GFR calc Af Amer 45 (L) >60 mL/min    Comment: (NOTE) The eGFR has been calculated using the CKD EPI equation. This calculation has not been validated in all clinical situations. eGFR's persistently <60 mL/min signify possible Chronic Kidney Disease.    Anion gap 8 5 - 15  CBC with Differential     Status: Abnormal   Collection Time: 03/13/17  8:10 PM  Result Value Ref Range   WBC 10.0 4.0 - 10.5 K/uL   RBC 3.59 (L) 3.87 - 5.11 MIL/uL   Hemoglobin 10.9 (L) 12.0 - 15.0 g/dL   HCT 34.9 (L) 36.0 - 46.0 %   MCV 97.2 78.0 - 100.0 fL   MCH 30.4 26.0 - 34.0 pg   MCHC 31.2 30.0 - 36.0 g/dL   RDW 14.7 11.5 - 15.5 %   Platelets 125 (L) 150 - 400 K/uL   Neutrophils Relative % 71 %   Neutro Abs 7.2 1.7 - 7.7 K/uL   Lymphocytes Relative 18 %   Lymphs Abs 1.8 0.7 - 4.0 K/uL   Monocytes Relative 10 %   Monocytes Absolute 1.0 0.1 - 1.0 K/uL   Eosinophils Relative 1 %   Eosinophils Absolute 0.1 0.0 - 0.7 K/uL   Basophils Relative 0 %   Basophils Absolute 0.0 0.0 - 0.1 K/uL  Type and screen [place]     Status: None   Collection Time: 03/14/17 12:23 AM  Result Value Ref Range   ABO/RH(D) A NEG    Antibody Screen NEG    Sample Expiration 03/17/2017   Glucose, capillary     Status: Abnormal   Collection Time: 03/14/17  5:55 AM  Result Value Ref Range   Glucose-Capillary 162 (H) 65 - 99 mg/dL  Glucose, capillary     Status: Abnormal   Collection Time: 03/14/17  7:01 AM  Result Value Ref Range   Glucose-Capillary 154 (H) 65 - 99 mg/dL  Urinalysis, Routine w reflex microscopic     Status: Abnormal   Collection Time: 03/14/17 10:24 AM  Result  Value Ref Range   Color, Urine YELLOW YELLOW   APPearance CLEAR CLEAR   Specific Gravity, Urine 1.019 1.005 - 1.030   pH 5.0 5.0 - 8.0   Glucose, UA 50 (A) NEGATIVE mg/dL   Hgb urine dipstick SMALL  (A) NEGATIVE   Bilirubin Urine NEGATIVE NEGATIVE   Ketones, ur 20 (A) NEGATIVE mg/dL   Protein, ur NEGATIVE NEGATIVE mg/dL   Nitrite NEGATIVE NEGATIVE   Leukocytes, UA TRACE (A) NEGATIVE   RBC / HPF 0-5 0 - 5 RBC/hpf   WBC, UA 0-5 0 - 5 WBC/hpf   Bacteria, UA MANY (A) NONE SEEN   Squamous Epithelial / LPF 0-5 (A) NONE SEEN     CBC, PT/INR, aptt and CMET are pending for this morning  Dg Knee 1-2 Views Right  Result Date: 03/13/2017 CLINICAL DATA:  Fall with right leg deformity.  Initial encounter. EXAM: RIGHT KNEE - 1-2 VIEW COMPARISON:  None. FINDINGS: Comminuted fracture of the right femoral shaft extending from the midportion to the supracondylar femur. On the AP view there is extension to the level of a total knee arthroplasty that is nondisplaced. IMPRESSION: Displaced fracture of the right femoral shaft that extends to the knee arthroplasty on AP view. The arthroplasty hardware is located. Electronically Signed   By: Monte Fantasia M.D.   On: 03/13/2017 19:48   Ct Head Wo Contrast  Result Date: 03/13/2017 CLINICAL DATA:  Tripped and fell.  Hit head. EXAM: CT HEAD WITHOUT CONTRAST TECHNIQUE: Contiguous axial images were obtained from the base of the skull through the vertex without intravenous contrast. COMPARISON:  09/11/2016 FINDINGS: Brain: Stable age related cerebral atrophy, ventriculomegaly and periventricular white matter disease. No extra-axial fluid collections are identified. No CT findings for acute hemispheric infarction or intracranial hemorrhage. No mass lesions. The brainstem and cerebellum are normal. Vascular: Stable vascular calcifications. No hyperdense vessels or aneurysm. Skull: No acute skull or facial bone fractures. Sinuses/Orbits: Chronic left maxillary sinus disease with high attenuation material filling the sinus. This is likely chronic inspissated debris. The mastoid air cells and middle ear cavities are clear. The globes are intact. Other: No scalp lesions or  hematoma. IMPRESSION: No acute intracranial findings or skull fracture. Chronic left maxillary sinus disease. Electronically Signed   By: Marijo Sanes M.D.   On: 03/13/2017 19:30   Dg Chest Port 1 View  Result Date: 03/14/2017 CLINICAL DATA:  Preoperative examination prior to right hip surgery EXAM: PORTABLE CHEST 1 VIEW COMPARISON:  PA and lateral chest x-ray of February 10, 2017 FINDINGS: The lungs are well-expanded. There is no focal infiltrate. There is no pleural effusion. The heart and pulmonary vascularity are normal. There calcification in the wall of the thoracic aorta. There is no pleural effusion. The bony thorax exhibits no acute abnormality. IMPRESSION: There is no active cardiopulmonary disease. Thoracic aortic atherosclerosis. Electronically Signed   By: David  Martinique M.D.   On: 03/14/2017 07:28   Dg Femur Min 2 Views Right  Result Date: 03/13/2017 CLINICAL DATA:  Right knee deformity due to fall. Initial encounter. EXAM: RIGHT FEMUR 2 VIEWS COMPARISON:  None. FINDINGS: Comminuted fracture of the right femoral shaft extending from the midportion to the supracondylar femur. On the AP view there is extension to the level of a total knee arthroplasty that is nondisplaced. Right hip hemiarthroplasty with lucent appearance around the femoral stem. Located knee and hip joint. IMPRESSION: 1. Displaced fracture of the right femoral shaft reaching the located knee arthroplasty. 2. Right hip hemiarthroplasty  with lucency around the femoral stem. Electronically Signed   By: Monte Fantasia M.D.   On: 03/13/2017 19:47    Review of Systems  Constitutional: Negative for chills and fever.  Respiratory: Negative for shortness of breath and wheezing.   Cardiovascular: Negative for chest pain and palpitations.  Gastrointestinal: Negative for abdominal pain, nausea and vomiting.  Genitourinary:       Urine wick  Musculoskeletal:       Right thigh pain  Neurological: Negative for tingling and sensory  change.   Blood pressure 105/66, pulse 73, temperature 97.8 F (36.6 C), temperature source Oral, resp. rate 16, height '5\' 1"'  (1.549 m), weight 65.3 kg (144 lb), SpO2 95 %. Physical Exam  Constitutional: She is cooperative.  Pleasant white female, no acute distress, interactive, answers questions appropriately.  A little hard of hearing  HENT:  Head: Normocephalic and atraumatic.  Mouth/Throat: Mucous membranes are dry.  Cardiovascular: An irregularly irregular rhythm present.  Pulmonary/Chest: No accessory muscle usage. No respiratory distress.  Clear anterior fields  Abdominal: Soft. Bowel sounds are normal. There is no tenderness.  Musculoskeletal:  Pelvis   no traumatic wounds or rash, no ecchymosis, stable to manual stress, nontender  Right lower extremity Inspection: Right leg is in a posterior long-leg splint, did not remove splint as fracture is reportedly closed.  No blood or oozing noted on the splint dressing Leg is externally rotated Mild swelling to the right leg noted  Bony eval: Tenderness palpation mid thigh No hip tenderness, no lower leg tenderness.  No ankle or foot tenderness Soft tissue: Swelling distal right lower leg Surgical wound right hip well-healed No open wounds or lesions ROM: I did not assess hip knee or ankle range of motion as the patient is in a posterior long-leg splint Patient is able to wiggle her toes Sensation: DPN, SPN, TN sensory functions are grossly intact Motor: EHL, FHL, lesser toe motor functions are grossly Vascular: + DP pulse Extremity is warm Compartments are soft  Left lower extremity Inspection: No gross deformities Midline surgical scar over left knee well-healed Mild swelling proximal tibia No ecchymosis Bony eval: No pain or discomfort with axial loading or logrolling of the hip Distal femur is nontender Patient does have some mild discomfort and pain with palpation of her proximal tibia.  No crepitus or gross  motion noted at the tibia with manipulation of her knee Soft tissue: Mild swelling proximal tibia No open wounds or lesions otherwise noted No significant swelling to the left leg ROM: Full ankle range of motion noted Patient tolerates gentle knee range of motion Sensation: DPN, SPN, TN sensory functions are grossly intact Motor: EHL, FHL, anterior tibialis, posterior tibialis, peroneals and gastrocsoleus complex motor functions are grossly intact Vascular: Extremity is warm + DP pulse Compartments are soft, no pain with passive stretching  Bilateral upper extremities  shoulder, elbow, wrist, digits- no skin wounds, nontender, no instability, no blocks to motion  Sens  Ax/R/M/U intact  Mot   Ax/ R/ PIN/ M/ AIN/ U intact  Rad 2+    Neurological: She is alert.  Psychiatric: She has a normal mood and affect. Her speech is normal and behavior is normal.  Nursing note and vitals reviewed.    Assessment/Plan:  84 year old white female ground-level fall with right periprosthetic femur fracture  -Fall  -Right periprosthetic femur fracture  Even though patient activity is somewhat minimal to feel that surgical intervention be the best option to afford the patient to get back  to her somewhat independent living status.  Patient does live by herself but does require a fair amount of assistance with ADLs.  I do feel that it would be very difficult to treat her fracture nonoperatively with all of her medical comorbidities as well as the cumbersome nature of immobilization for femur fracture.  At least with surgical fixation she would be able to mobilize with high level of comfort with assistance   Discussed surgical and nonsurgical management with patient and granddaughter.  They are in agreement with surgical intervention   Hopeful for OR later this afternoon may need to delay until tomorrow due to OR availability.   Check CBC, CMET, coag panel now   we should be able to fix the  fracture with minimally invasive techniques which should curb blood loss.  However given the fact that this is a femur fracture she may require blood because of the injury.  This was discussed with patient and granddaughter as well   Highly suspect that the patient will need a skilled nursing center at discharge as she does live alone.  She is not thrilled with this idea as she is quite adamant about returning home on discharge.  We will see how patient progresses with therapy remote possibility that we could consider inpatient rehab.  Could consider allowing the patient to be weightbearing as tolerated postoperatively with the use of walker which could increase her level of independence more quickly.  However her bone quality is likely suboptimal and will require a period of modified weightbearing  - Left knee tenderness (proximal tibia)  Check xrays   - Pain management:  Continue with current regimen  - ABL anemia/Hemodynamics  CBC is pending  - Medical issues   Per medical service  - DVT/PE prophylaxis:  Can restart Eliquis postoperatively  - Metabolic Bone Disease:  Check vitamin D levels  - Activity:  Bedrest for now  Decubitus precautions  - FEN/GI prophylaxis/Foley/Lines:  Wean off of urine wick as soon as possible  - Dispo:  Hopeful for OR later this afternoon versus tomorrow   Jari Pigg, PA-C [facility] [phone] (C) [phone] (O) 03/14/2017, 11:56 AM

## 2017-03-14 NOTE — Progress Notes (Signed)
Initial Nutrition Assessment  DOCUMENTATION CODES:   Not applicable  INTERVENTION:    Advance diet as medically appropriate   RD to add supplements when/as able  NUTRITION DIAGNOSIS:   Increased nutrient needs related to hip fracture, post-op healing as evidenced by estimated needs  GOAL:   Patient will meet greater than or equal to 90% of their needs  MONITOR:   Diet advancement, PO intake, Supplement acceptance, Labs, Skin, Weight trends  REASON FOR ASSESSMENT:   Consult Assessment of nutrition requirement/status  ASSESSMENT:   84 yo Female with a history of atrial fibrillation and CHF presented s/p fall.   RD spoke with pt and daughter at bedside. Pt reports she's hasn't eaten anything in approximately 3 days. Appetite was good PTA. Diet recall includes:  Breakfast: cereal with milk, eggs, toast & coffee Snack: Boost or Ensure nutrition supplement Dinner: meat, starch & veggie (family brings)  Daughter unsure if pt has lost weight recently. UBW is 145 lbs. Pt currently NPO for possible surgery this afternoon. Labs and medications reviewed. CBG's 162-154.  NUTRITION - FOCUSED PHYSICAL EXAM:    Most Recent Value  Orbital Region  No depletion  Upper Arm Region  No depletion  Thoracic and Lumbar Region  Unable to assess  Buccal Region  No depletion  Temple Region  No depletion  Clavicle Bone Region  Mild depletion  Clavicle and Acromion Bone Region  Mild depletion  Scapular Bone Region  Unable to assess  Dorsal Hand  Unable to assess  Patellar Region  No depletion  Anterior Thigh Region  No depletion  Posterior Calf Region  No depletion  Edema (RD Assessment)  None     Diet Order:  Diet NPO time specified  EDUCATION NEEDS:   No education needs have been identified at this time  Skin:  Skin Assessment: Reviewed RN Assessment  Last BM:  N/A  Height:   Ht Readings from Last 1 Encounters:  03/14/17 5\' 1"  (1.549 m)   Weight:   Wt Readings  from Last 1 Encounters:  03/14/17 144 lb (65.3 kg)   Ideal Body Weight:  47.7 kg  BMI:  Body mass index is 27.21 kg/m.  Estimated Nutritional Needs:   Kcal:  1300-1500  Protein:  65-80 gm  Fluid:  >/= 1.5 L  Arthur Holms, RD, LDN Pager #: [phone] After-Hours Pager #: [phone]

## 2017-03-14 NOTE — Progress Notes (Signed)
Orthopedic Tech Progress Note Patient Details:  [name] October 28, 1924 [id number]  Ortho Devices Type of Ortho Device: Other (comment) Ortho Device/Splint Location: Ortho Tech:   checked in on pt to see if splint helped.  pt seem to be tolerating splint fair.  Splint is still intact. Ortho Device/Splint Interventions: Application   Post Interventions Patient Tolerated: Well   Kristopher Oppenheim 03/14/2017, 2:08 PM

## 2017-03-14 NOTE — Progress Notes (Signed)
[facility]opaedic Trauma Service   Plan for OR tomorrow with Dr. Adria Dill to eat today NPO after MN  Cbc in am  Bed rest for now  Will start pt on some IVF as she has been NPO since yesterday. BUN and Cr have bumped up a little bit as well. Will run at 50 cc/hr given hx of CHF. Monitor volume status   Continue to hold anticoagulation  Decubitus precautions   Jari Pigg, PA-C [facility] [phone] (C) [phone] (O) 03/14/2017 3:31 PM

## 2017-03-14 NOTE — Progress Notes (Signed)
X-rays reviewed on this patient with femur fracture Patient has spiral femur fracture between total hip and total knee replacement Patient started Eliquis On Monday for atrial fibrillation  I will notify Dr. Lorin Mercy of her admittance to the hospital

## 2017-03-15 ENCOUNTER — Encounter (HOSPITAL_COMMUNITY): Payer: Self-pay | Admitting: Certified Registered Nurse Anesthetist

## 2017-03-15 ENCOUNTER — Encounter (HOSPITAL_COMMUNITY)
Admission: EM | Disposition: A | Discharge status: Skilled Nursing Facility | Payer: Self-pay | Source: Home / Self Care | Attending: Family Medicine

## 2017-03-15 ENCOUNTER — Inpatient Hospital Stay (HOSPITAL_COMMUNITY): Payer: Medicare Other

## 2017-03-15 ENCOUNTER — Inpatient Hospital Stay (HOSPITAL_COMMUNITY): Payer: Medicare Other | Admitting: Critical Care Medicine

## 2017-03-15 DIAGNOSIS — S72411D Displaced unspecified condyle fracture of lower end of right femur, subsequent encounter for closed fracture with routine healing: Secondary | ICD-10-CM

## 2017-03-15 HISTORY — PX: ORIF FEMUR FRACTURE: SHX2119

## 2017-03-15 LAB — BASIC METABOLIC PANEL
Anion gap: 9 (ref 5–15)
BUN: 31 mg/dL — AB (ref 6–20)
CO2: 22 mmol/L (ref 22–32)
CREATININE: 1.35 mg/dL — AB (ref 0.44–1.00)
Calcium: 8.6 mg/dL — ABNORMAL LOW (ref 8.9–10.3)
Chloride: 101 mmol/L (ref 101–111)
GFR calc Af Amer: 39 mL/min — ABNORMAL LOW (ref >60)
GFR, EST NON AFRICAN AMERICAN: 33 mL/min — AB (ref >60)
Glucose, Bld: 230 mg/dL — ABNORMAL HIGH (ref 65–99)
POTASSIUM: 4.9 mmol/L (ref 3.5–5.1)
Sodium: 132 mmol/L — ABNORMAL LOW (ref 135–145)

## 2017-03-15 LAB — CBC
HEMATOCRIT: 28.7 % — AB (ref 36.0–46.0)
Hemoglobin: 9.1 g/dL — ABNORMAL LOW (ref 12.0–15.0)
MCH: 30.5 pg (ref 26.0–34.0)
MCHC: 31.7 g/dL (ref 30.0–36.0)
MCV: 96.3 fL (ref 78.0–100.0)
PLATELETS: 121 10*3/uL — AB (ref 150–400)
RBC: 2.98 MIL/uL — ABNORMAL LOW (ref 3.87–5.11)
RDW: 14.8 % (ref 11.5–15.5)
WBC: 9.9 10*3/uL (ref 4.0–10.5)

## 2017-03-15 LAB — GLUCOSE, CAPILLARY
Glucose-Capillary: 221 mg/dL — ABNORMAL HIGH (ref 65–99)
Glucose-Capillary: 130 mg/dL — ABNORMAL HIGH (ref 65–99)
Glucose-Capillary: 103 mg/dL — ABNORMAL HIGH (ref 65–99)
Glucose-Capillary: 109 mg/dL — ABNORMAL HIGH (ref 65–99)
Glucose-Capillary: 214 mg/dL — ABNORMAL HIGH (ref 65–99)

## 2017-03-15 LAB — MAGNESIUM: Magnesium: 1.7 mg/dL (ref 1.7–2.4)

## 2017-03-15 SURGERY — OPEN REDUCTION INTERNAL FIXATION (ORIF) DISTAL FEMUR FRACTURE
Anesthesia: General | Laterality: Right

## 2017-03-15 MED ORDER — ONDANSETRON HCL 4 MG/2ML IJ SOLN
INTRAMUSCULAR | Status: DC | PRN
  Administered 2017-03-15: 4 mg via INTRAVENOUS

## 2017-03-15 MED ORDER — GENERIC EXTERNAL MEDICATION
Status: DC
Start: ? — End: 2017-03-15

## 2017-03-15 MED ORDER — VANCOMYCIN HCL 1000 MG IV SOLR
INTRAVENOUS | Status: AC
  Filled 2017-03-15: qty 1000

## 2017-03-15 MED ORDER — LACTATED RINGERS IV SOLN
INTRAVENOUS | Status: DC
  Administered 2017-03-15 (×2): via INTRAVENOUS

## 2017-03-15 MED ORDER — DEXAMETHASONE SODIUM PHOSPHATE 10 MG/ML IJ SOLN
INTRAMUSCULAR | Status: DC | PRN
  Administered 2017-03-15: 10 mg via INTRAVENOUS

## 2017-03-15 MED ORDER — PHENYLEPHRINE HCL 10 MG/ML IJ SOLN
INTRAVENOUS | Status: DC | PRN
  Administered 2017-03-15: 20 ug/min via INTRAVENOUS

## 2017-03-15 MED ORDER — SUGAMMADEX SODIUM 500 MG/5ML IV SOLN
INTRAVENOUS | Status: DC | PRN
  Administered 2017-03-15: 150 mg via INTRAVENOUS

## 2017-03-15 MED ORDER — DEXAMETHASONE SODIUM PHOSPHATE 10 MG/ML IJ SOLN
INTRAMUSCULAR | Status: AC
  Filled 2017-03-15: qty 1

## 2017-03-15 MED ORDER — LACTATED RINGERS IV SOLN
INTRAVENOUS | Status: DC | PRN
  Administered 2017-03-15: 13:00:00 via INTRAVENOUS

## 2017-03-15 MED ORDER — LIDOCAINE 2% (20 MG/ML) 5 ML SYRINGE
INTRAMUSCULAR | Status: AC
  Filled 2017-03-15: qty 5

## 2017-03-15 MED ORDER — PHENYLEPHRINE HCL 10 MG/ML IJ SOLN
INTRAMUSCULAR | Status: DC | PRN
  Administered 2017-03-15: 40 ug via INTRAVENOUS
  Administered 2017-03-15: 80 ug via INTRAVENOUS
  Administered 2017-03-15: 40 ug via INTRAVENOUS
  Administered 2017-03-15: 80 ug via INTRAVENOUS

## 2017-03-15 MED ORDER — LIDOCAINE HCL (CARDIAC) 20 MG/ML IV SOLN
INTRAVENOUS | Status: DC | PRN
  Administered 2017-03-15: 80 mg via INTRAVENOUS

## 2017-03-15 MED ORDER — FENTANYL CITRATE (PF) 100 MCG/2ML IJ SOLN
INTRAMUSCULAR | Status: AC
  Administered 2017-03-15: 50 ug via INTRAVENOUS
  Filled 2017-03-15: qty 2

## 2017-03-15 MED ORDER — URSODIOL 300 MG PO CAPS
300.0000 mg | ORAL_CAPSULE | Freq: Two times a day (BID) | ORAL | Status: DC
  Administered 2017-03-15 – 2017-03-18 (×6): 300 mg via ORAL
  Filled 2017-03-15 (×7): qty 1

## 2017-03-15 MED ORDER — VANCOMYCIN HCL 1000 MG IV SOLR
INTRAVENOUS | Status: DC | PRN
  Administered 2017-03-15: 1000 mg via TOPICAL

## 2017-03-15 MED ORDER — 0.9 % SODIUM CHLORIDE (POUR BTL) OPTIME
TOPICAL | Status: DC | PRN
  Administered 2017-03-15: 1000 mL

## 2017-03-15 MED ORDER — ROCURONIUM BROMIDE 100 MG/10ML IV SOLN
INTRAVENOUS | Status: DC | PRN
  Administered 2017-03-15: 50 mg via INTRAVENOUS

## 2017-03-15 MED ORDER — ONDANSETRON HCL 4 MG/2ML IJ SOLN
INTRAMUSCULAR | Status: AC
  Filled 2017-03-15: qty 2

## 2017-03-15 MED ORDER — LABETALOL HCL 5 MG/ML IV SOLN
INTRAVENOUS | Status: DC | PRN
  Administered 2017-03-15: 5 mg via INTRAVENOUS

## 2017-03-15 MED ORDER — PROPOFOL 10 MG/ML IV BOLUS
INTRAVENOUS | Status: AC
  Filled 2017-03-15: qty 20

## 2017-03-15 MED ORDER — PROPOFOL 10 MG/ML IV BOLUS
INTRAVENOUS | Status: DC | PRN
  Administered 2017-03-15: 100 mg via INTRAVENOUS

## 2017-03-15 MED ORDER — FENTANYL CITRATE (PF) 250 MCG/5ML IJ SOLN
INTRAMUSCULAR | Status: AC
  Filled 2017-03-15: qty 5

## 2017-03-15 MED ORDER — FENTANYL CITRATE (PF) 100 MCG/2ML IJ SOLN
25.0000 ug | INTRAMUSCULAR | Status: DC | PRN
  Administered 2017-03-15 (×2): 50 ug via INTRAVENOUS

## 2017-03-15 MED ORDER — CEFAZOLIN SODIUM-DEXTROSE 2-4 GM/100ML-% IV SOLN
2.0000 g | Freq: Two times a day (BID) | INTRAVENOUS | Status: AC
  Administered 2017-03-15 – 2017-03-16 (×2): 2 g via INTRAVENOUS
  Filled 2017-03-15 (×2): qty 100

## 2017-03-15 MED ORDER — FENTANYL CITRATE (PF) 100 MCG/2ML IJ SOLN
INTRAMUSCULAR | Status: DC | PRN
  Administered 2017-03-15: 25 ug via INTRAVENOUS
  Administered 2017-03-15: 150 ug via INTRAVENOUS
  Administered 2017-03-15: 25 ug via INTRAVENOUS

## 2017-03-15 SURGICAL SUPPLY — 82 items
BANDAGE ACE 4X5 VEL STRL LF (GAUZE/BANDAGES/DRESSINGS) ×3 IMPLANT
BANDAGE ACE 6X5 VEL STRL LF (GAUZE/BANDAGES/DRESSINGS) ×3 IMPLANT
BIT DRILL 4.3 (BIT) ×2
BIT DRILL 4.3MM (BIT) ×1
BIT DRILL 4.3X300MM (BIT) ×1 IMPLANT
BIT DRILL LONG 3.3 (BIT) ×2 IMPLANT
BIT DRILL LONG 3.3MM (BIT) ×1
BIT DRILL QC 3.3X195 (BIT) ×3 IMPLANT
BLADE CLIPPER SURG (BLADE) IMPLANT
BNDG COHESIVE 6X5 TAN STRL LF (GAUZE/BANDAGES/DRESSINGS) IMPLANT
BNDG GAUZE ELAST 4 BULKY (GAUZE/BANDAGES/DRESSINGS) ×3 IMPLANT
BRUSH SCRUB SURG 4.25 DISP (MISCELLANEOUS) ×6 IMPLANT
CANISTER SUCT 3000ML PPV (MISCELLANEOUS) ×3 IMPLANT
CAP LOCK NCB (Cap) ×24 IMPLANT
CHLORAPREP W/TINT 26ML (MISCELLANEOUS) ×3 IMPLANT
COVER SURGICAL LIGHT HANDLE (MISCELLANEOUS) ×3 IMPLANT
DERMABOND ADVANCED (GAUZE/BANDAGES/DRESSINGS) ×4
DERMABOND ADVANCED .7 DNX12 (GAUZE/BANDAGES/DRESSINGS) ×2 IMPLANT
DRAPE C-ARM 42X72 X-RAY (DRAPES) ×3 IMPLANT
DRAPE C-ARMOR (DRAPES) ×3 IMPLANT
DRAPE ORTHO SPLIT 77X108 STRL (DRAPES) ×8
DRAPE SURG 17X23 STRL (DRAPES) ×3 IMPLANT
DRAPE SURG ORHT 6 SPLT 77X108 (DRAPES) ×4 IMPLANT
DRAPE U-SHAPE 47X51 STRL (DRAPES) ×3 IMPLANT
DRSG ADAPTIC 3X8 NADH LF (GAUZE/BANDAGES/DRESSINGS) IMPLANT
DRSG MEPILEX BORDER 4X12 (GAUZE/BANDAGES/DRESSINGS) IMPLANT
DRSG MEPILEX BORDER 4X4 (GAUZE/BANDAGES/DRESSINGS) IMPLANT
DRSG MEPILEX BORDER 4X8 (GAUZE/BANDAGES/DRESSINGS) ×6 IMPLANT
DRSG PAD ABDOMINAL 8X10 ST (GAUZE/BANDAGES/DRESSINGS) IMPLANT
ELECT REM PT RETURN 9FT ADLT (ELECTROSURGICAL) ×3
ELECTRODE REM PT RTRN 9FT ADLT (ELECTROSURGICAL) ×1 IMPLANT
EVACUATOR 1/8 PVC DRAIN (DRAIN) IMPLANT
EVACUATOR 3/16  PVC DRAIN (DRAIN)
EVACUATOR 3/16 PVC DRAIN (DRAIN) IMPLANT
GAUZE SPONGE 4X4 12PLY STRL (GAUZE/BANDAGES/DRESSINGS) ×3 IMPLANT
GLOVE BIO SURGEON STRL SZ7.5 (GLOVE) ×12 IMPLANT
GLOVE BIOGEL PI IND STRL 7.5 (GLOVE) ×2 IMPLANT
GLOVE BIOGEL PI INDICATOR 7.5 (GLOVE) ×4
GOWN STRL REUS W/ TWL LRG LVL3 (GOWN DISPOSABLE) ×3 IMPLANT
GOWN STRL REUS W/TWL LRG LVL3 (GOWN DISPOSABLE) ×6
K-WIRE 2.0 (WIRE) ×4
K-WIRE FXSTD 280X2XNS SS (WIRE) ×2
KIT BASIN OR (CUSTOM PROCEDURE TRAY) ×3 IMPLANT
KIT ROOM TURNOVER OR (KITS) ×3 IMPLANT
KWIRE FXSTD 280X2XNS SS (WIRE) ×2 IMPLANT
NEEDLE 22X1 1/2 (OR ONLY) (NEEDLE) IMPLANT
NS IRRIG 1000ML POUR BTL (IV SOLUTION) ×3 IMPLANT
PACK GENERAL/GYN (CUSTOM PROCEDURE TRAY) ×3 IMPLANT
PAD ARMBOARD 7.5X6 YLW CONV (MISCELLANEOUS) ×6 IMPLANT
PAD CAST 4YDX4 CTTN HI CHSV (CAST SUPPLIES) ×1 IMPLANT
PADDING CAST COTTON 4X4 STRL (CAST SUPPLIES) ×2
PADDING CAST COTTON 6X4 STRL (CAST SUPPLIES) ×3 IMPLANT
PLATE DISTAL FEMUR 15H 317M RT (Plate) ×3 IMPLANT
SCREW 5.0 70MM (Screw) ×3 IMPLANT
SCREW 5.0 80MM (Screw) ×3 IMPLANT
SCREW CORTICAL NCB 5.0X65 (Screw) ×3 IMPLANT
SCREW NCB 3.5X75X5X6.2XST (Screw) ×3 IMPLANT
SCREW NCB 4.0MX34M (Screw) ×3 IMPLANT
SCREW NCB 4.0MX46M (Screw) ×3 IMPLANT
SCREW NCB 4.0MX55M (Screw) ×3 IMPLANT
SCREW NCB 4.0X36MM (Screw) ×3 IMPLANT
SCREW NCB 5.0X36MM (Screw) ×3 IMPLANT
SCREW NCB 5.0X75MM (Screw) ×6 IMPLANT
SCREW UNI CORTICAL 5.0X14MM (Screw) ×2 IMPLANT
SCREW UNICORTICAL 5.0X14 (Screw) ×1 IMPLANT
SPONGE LAP 18X18 X RAY DECT (DISPOSABLE) ×3 IMPLANT
STAPLER VISISTAT 35W (STAPLE) ×3 IMPLANT
SUCTION FRAZIER HANDLE 10FR (MISCELLANEOUS) ×2
SUCTION TUBE FRAZIER 10FR DISP (MISCELLANEOUS) ×1 IMPLANT
SUT MNCRL AB 3-0 PS2 18 (SUTURE) IMPLANT
SUT VIC AB 1 CT1 27 (SUTURE)
SUT VIC AB 1 CT1 27XBRD ANBCTR (SUTURE) IMPLANT
SUT VIC AB 1 CTX 36 (SUTURE) ×2
SUT VIC AB 1 CTX36XBRD ANBCTR (SUTURE) ×1 IMPLANT
SUT VIC AB 2-0 CT1 27 (SUTURE)
SUT VIC AB 2-0 CT1 36 (SUTURE) ×3 IMPLANT
SUT VIC AB 2-0 CT1 TAPERPNT 27 (SUTURE) IMPLANT
SUT VIC AB 2-0 FS1 27 (SUTURE) ×3 IMPLANT
TOWEL GREEN STERILE (TOWEL DISPOSABLE) ×12 IMPLANT
TOWEL OR 17X26 10 PK STRL BLUE (TOWEL DISPOSABLE) ×6 IMPLANT
TRAY FOLEY W/METER SILVER 16FR (SET/KITS/TRAYS/PACK) ×3 IMPLANT
WATER STERILE IRR 1000ML POUR (IV SOLUTION) ×3 IMPLANT

## 2017-03-15 NOTE — Progress Notes (Signed)
Inpatient Diabetes Program Recommendations  AACE/ADA: New Consensus Statement on Inpatient Glycemic Control (2015)  Target Ranges:  Prepandial:   less than 140 mg/dL      Peak postprandial:   less than 180 mg/dL (1-2 hours)      Critically ill patients:  140 - 180 mg/dL   Results for [name], [name] (MRN [id number]) as of 03/15/2017 11:26  Ref. Range 03/14/2017 07:01 03/14/2017 12:22 03/14/2017 16:52 03/14/2017 21:34 03/15/2017 06:16  Glucose-Capillary Latest Ref Range: 65 - 99 mg/dL 154 (H) 122 (H) 126 (H) 204 (H) 221 (H)   Review of Glycemic Control  Diabetes history: DM2 Outpatient Diabetes medications: Amaryl 2 mg daily, Tradjenta 5 mg daily; Prednisone 3 mg QAM Current orders for Inpatient glycemic control: Tradjenta 5 mg daily, Novolog 0-9 units TID with meals; Solumedrol 50 mg Q12H  Inpatient Diabetes Program Recommendations:  Correction (SSI): Please consider ordering Novolog 0-5 units QHS for bedtime correction.  Thanks, Barnie Alderman, RN, MSN, CDE Diabetes Coordinator Inpatient Diabetes Program [phone] (Team Pager from 8am to 5pm)

## 2017-03-15 NOTE — Anesthesia Preprocedure Evaluation (Signed)
Anesthesia Evaluation  Patient identified by MRN, date of birth, ID band Patient awake    Reviewed: Allergy & Precautions, NPO status , Patient's Chart, lab work & pertinent test results  History of Anesthesia Complications (+) PONV  Airway Mallampati: I  TM Distance: >3 FB Neck ROM: Full    Dental   Pulmonary COPD, former smoker,    Pulmonary exam normal        Cardiovascular hypertension, Pt. on medications Normal cardiovascular exam     Neuro/Psych Anxiety Depression TIA   GI/Hepatic   Endo/Other  diabetes, Type 2, Insulin Dependent  Renal/GU Renal InsufficiencyRenal disease     Musculoskeletal   Abdominal   Peds  Hematology   Anesthesia Other Findings   Reproductive/Obstetrics                             Anesthesia Physical Anesthesia Plan  ASA: III  Anesthesia Plan: General   Post-op Pain Management:    Induction:   PONV Risk Score and Plan: 4 or greater and Ondansetron, Dexamethasone and Treatment may vary due to age or medical condition  Airway Management Planned: Oral ETT  Additional Equipment:   Intra-op Plan:   Post-operative Plan: Extubation in OR  Informed Consent: I have reviewed the patients History and Physical, chart, labs and discussed the procedure including the risks, benefits and alternatives for the proposed anesthesia with the patient or authorized representative who has indicated his/her understanding and acceptance.     Plan Discussed with: CRNA and Surgeon  Anesthesia Plan Comments:         Anesthesia Quick Evaluation

## 2017-03-15 NOTE — Transfer of Care (Signed)
Immediate Anesthesia Transfer of Care Note  Patient: [name]  Procedure(s) Performed: OPEN REDUCTION INTERNAL FIXATION (ORIF) DISTAL FEMUR FRACTURE (Right )  Patient Location: PACU  Anesthesia Type:General  Level of Consciousness: awake, drowsy and patient cooperative  Airway & Oxygen Therapy: Patient Spontanous Breathing and Patient connected to nasal cannula oxygen  Post-op Assessment: Report given to RN and Post -op Vital signs reviewed and stable  Post vital signs: Reviewed and stable  Last Vitals:  Vitals:   03/14/17 1952 03/15/17 0446  BP: (!) 102/58 118/67  Pulse: 80 77  Resp: 17 13  Temp: 36.8 C (!) 36.3 C  SpO2: 97% 95%    Last Pain:  Vitals:   03/15/17 0531  TempSrc:   PainSc: 3          Complications: No apparent anesthesia complications

## 2017-03-15 NOTE — Progress Notes (Signed)
PROGRESS NOTE [facility] [address] [name]   IWL:[id number] DOB: [birth year]  DOA: 03/13/2017 PCP: Patient, No Pcp Per   Brief Narrative:  [name] is a 84 year old female with PMH of A. fib, chronic diastolic CHF, type II DM, PMR, CKD; admitted on 03/13/2017, presented with complaint of fall, was found to have femur fracture.  Admitted for surgical repair.  Patient underwent on ORIF of the right femur.  Subjective: Patient seen and examined, pain is well controlled.  No other complaints.  Assessment & Plan: Right femoral fracture/mechanical fall Status post ORIF Continue management per orthopedic  A. fib Currently on A. fib but rate controlled Continue beta-blocker Holding Eliquis due to surgery will resume per surgical recommendations.  Polymyalgia rheumatica On chronic steroids Currently on stress dose steroid  Chronic kidney disease stage III Creatinine baseline Monitor BMP in a.m.  Anemia of chronic disease Hemoglobin stable  Chronic diastolic CHF Seems to be compensated at this time Lasix on hold in view of surgery Will be evaluating the morning  Type 2 diabetes mellitus Holding home oral hypoglycemic agent Patient on sliding scale CBGs stable Continue to monitor  DVT prophylaxis: SCDs Code Status: Full code Family Communication: Family at bedside Disposition Plan: Likely SNF when cleared by orthopedic surgery  Consultants:   Orthopedic surgery  Procedures:   ORIF 03/15/2017  Antimicrobials: Anti-infectives (From admission, onward)   Start     Dose/Rate Route Frequency Ordered Stop   03/15/17 2100  ceFAZolin (ANCEF) IVPB 2g/100 mL premix     2 g 200 mL/hr over 30 Minutes Intravenous Every 12 hours 03/15/17 1618 03/16/17 2059   03/15/17 1436  vancomycin (VANCOCIN) powder  Status:  Discontinued       As needed 03/15/17 1437 03/15/17 1507   03/14/17 1600  ceFAZolin (ANCEF) powder 2 g  Status:  Discontinued     2 g Other To Surgery 03/14/17  1218 03/14/17 1241   03/14/17 1600  ceFAZolin (ANCEF) IVPB 2g/100 mL premix     2 g 200 mL/hr over 30 Minutes Intravenous  Once 03/14/17 1240 03/15/17 1300   03/14/17 1245  ceFAZolin (ANCEF) powder 1 g  Status:  Discontinued     1 g Other To Surgery 03/14/17 1235 03/14/17 1236       Objective: Vitals:   03/15/17 1527 03/15/17 1542 03/15/17 1557 03/15/17 1600  BP: 131/85 (!) 142/78 121/66 121/66  Pulse: 80 78 73 78  Resp: 19 20 17 18   Temp:    97.7 F (36.5 C)  TempSrc:      SpO2: 96% 99% 98% 97%  Weight:      Height:        Intake/Output Summary (Last 24 hours) at 03/15/2017 1618 Last data filed at 03/15/2017 1430 Gross per 24 hour  Intake 200 ml  Output 700 ml  Net -500 ml   Filed Weights   03/14/17 1040 03/15/17 1215  Weight: 65.3 kg (144 lb) 65.3 kg (144 lb)    Examination:  General exam: NAD Respiratory system: Clear to auscultation.  Cardiovascular system: S1 & S2, irregularly irregular Gastrointestinal system: Abdomen is nondistended, soft and nontender. Central nervous system: Alert and oriented. No focal neurological deficits. Extremities: No pedal edema.  Skin: No rashes, lesions or ulcers Psychiatry: Mood & affect appropriate.    Data Reviewed: I have personally reviewed following labs and imaging studies  CBC: Recent Labs  Lab 03/13/17 2010 03/14/17 1120 03/15/17 0702  WBC 10.0 11.7* 9.9  NEUTROABS 7.2  7.5  --   HGB 10.9* 10.3* 9.1*  HCT 34.9* 32.6* 28.7*  MCV 97.2 96.7 96.3  PLT 125* 154 308*   Basic Metabolic Panel: Recent Labs  Lab 03/13/17 2010 03/14/17 1120 03/15/17 0702  NA 135 136 132*  K 4.8 4.2 4.9  CL 105 102 101  CO2 22 21* 22  GLUCOSE 135* 129* 230*  BUN 27* 29* 31*  CREATININE 1.19* 1.23* 1.35*  CALCIUM 9.1 9.0 8.6*  MG  --   --  1.7   GFR: Estimated Creatinine Clearance: 24 mL/min (A) (by C-G formula based on SCr of 1.35 mg/dL (H)). Liver Function Tests: Recent Labs  Lab 03/14/17 1120  AST 31  ALT 30  ALKPHOS  68  BILITOT 1.1  PROT 6.2*  ALBUMIN 3.3*   No results for input(s): LIPASE, AMYLASE in the last 168 hours. No results for input(s): AMMONIA in the last 168 hours. Coagulation Profile: Recent Labs  Lab 03/14/17 1120  INR 1.28   Cardiac Enzymes: No results for input(s): CKTOTAL, CKMB, CKMBINDEX, TROPONINI in the last 168 hours. BNP (last 3 results) No results for input(s): PROBNP in the last 8760 hours. HbA1C: No results for input(s): HGBA1C in the last 72 hours. CBG: Recent Labs  Lab 03/14/17 1652 03/14/17 2134 03/15/17 0616 03/15/17 1153 03/15/17 1517  GLUCAP 126* 204* 221* 130* 103*   Lipid Profile: No results for input(s): CHOL, HDL, LDLCALC, TRIG, CHOLHDL, LDLDIRECT in the last 72 hours. Thyroid Function Tests: No results for input(s): TSH, T4TOTAL, FREET4, T3FREE, THYROIDAB in the last 72 hours. Anemia Panel: No results for input(s): VITAMINB12, FOLATE, FERRITIN, TIBC, IRON, RETICCTPCT in the last 72 hours. Sepsis Labs: No results for input(s): PROCALCITON, LATICACIDVEN in the last 168 hours.  Recent Results (from the past 240 hour(s))  Surgical PCR screen     Status: None   Collection Time: 03/14/17  2:47 PM  Result Value Ref Range Status   MRSA, PCR NEGATIVE NEGATIVE Final   Staphylococcus aureus NEGATIVE NEGATIVE Final    Comment: (NOTE) The Xpert SA Assay (FDA approved for NASAL specimens in patients 1 years of age and older), is one component of a comprehensive surveillance program. It is not intended to diagnose infection nor to guide or monitor treatment.     Radiology Studies: Dg Knee 1-2 Views Right  Result Date: 03/13/2017 CLINICAL DATA:  Fall with right leg deformity.  Initial encounter. EXAM: RIGHT KNEE - 1-2 VIEW COMPARISON:  None. FINDINGS: Comminuted fracture of the right femoral shaft extending from the midportion to the supracondylar femur. On the AP view there is extension to the level of a total knee arthroplasty that is nondisplaced.  IMPRESSION: Displaced fracture of the right femoral shaft that extends to the knee arthroplasty on AP view. The arthroplasty hardware is located. Electronically Signed   By: Monte Fantasia M.D.   On: 03/13/2017 19:48   Ct Head Wo Contrast  Result Date: 03/13/2017 CLINICAL DATA:  Tripped and fell.  Hit head. EXAM: CT HEAD WITHOUT CONTRAST TECHNIQUE: Contiguous axial images were obtained from the base of the skull through the vertex without intravenous contrast. COMPARISON:  09/11/2016 FINDINGS: Brain: Stable age related cerebral atrophy, ventriculomegaly and periventricular white matter disease. No extra-axial fluid collections are identified. No CT findings for acute hemispheric infarction or intracranial hemorrhage. No mass lesions. The brainstem and cerebellum are normal. Vascular: Stable vascular calcifications. No hyperdense vessels or aneurysm. Skull: No acute skull or facial bone fractures. Sinuses/Orbits: Chronic left maxillary sinus disease  with high attenuation material filling the sinus. This is likely chronic inspissated debris. The mastoid air cells and middle ear cavities are clear. The globes are intact. Other: No scalp lesions or hematoma. IMPRESSION: No acute intracranial findings or skull fracture. Chronic left maxillary sinus disease. Electronically Signed   By: Marijo Sanes M.D.   On: 03/13/2017 19:30   Dg Chest Port 1 View  Result Date: 03/14/2017 CLINICAL DATA:  Preoperative examination prior to right hip surgery EXAM: PORTABLE CHEST 1 VIEW COMPARISON:  PA and lateral chest x-ray of February 10, 2017 FINDINGS: The lungs are well-expanded. There is no focal infiltrate. There is no pleural effusion. The heart and pulmonary vascularity are normal. There calcification in the wall of the thoracic aorta. There is no pleural effusion. The bony thorax exhibits no acute abnormality. IMPRESSION: There is no active cardiopulmonary disease. Thoracic aortic atherosclerosis. Electronically Signed   By:  David  Martinique M.D.   On: 03/14/2017 07:28   Dg Knee Left Port  Result Date: 03/14/2017 CLINICAL DATA:  Left knee pain. History of prior total left knee arthroplasty. EXAM: PORTABLE LEFT KNEE - 1-2 VIEW COMPARISON:  08/08/2007 FINDINGS: The femoral and tibial components are well seated. No complicating features are identified. No joint effusion. No fracture. Moderate vascular calcifications. IMPRESSION: The total left knee arthroplasty components are intact without complicating features. No acute bony findings. Electronically Signed   By: Marijo Sanes M.D.   On: 03/14/2017 12:36   Dg C-arm 1-60 Min  Result Date: 03/15/2017 CLINICAL DATA:  ORIF femur fracture. EXAM: DG C-ARM 61-120 MIN; RIGHT FEMUR 2 VIEWS COMPARISON:  03/13/2017 FINDINGS: Multiple C-arm images show restoration of anatomic alignment at the femoral diaphyseal fracture site with lateral plate and screw fixation. Components appear well positioned. No radiographically detectable complication. Lateral view at the end shows slight separation of the main fragments but reasonably close apposition. IMPRESSION: ORIF femoral diaphyseal fracture. Electronically Signed   By: Nelson Chimes M.D.   On: 03/15/2017 15:01   Dg Femur, Min 2 Views Right  Result Date: 03/15/2017 CLINICAL DATA:  ORIF femur fracture. EXAM: DG C-ARM 61-120 MIN; RIGHT FEMUR 2 VIEWS COMPARISON:  03/13/2017 FINDINGS: Multiple C-arm images show restoration of anatomic alignment at the femoral diaphyseal fracture site with lateral plate and screw fixation. Components appear well positioned. No radiographically detectable complication. Lateral view at the end shows slight separation of the main fragments but reasonably close apposition. IMPRESSION: ORIF femoral diaphyseal fracture. Electronically Signed   By: Nelson Chimes M.D.   On: 03/15/2017 15:01   Dg Femur Min 2 Views Right  Result Date: 03/13/2017 CLINICAL DATA:  Right knee deformity due to fall. Initial encounter. EXAM: RIGHT  FEMUR 2 VIEWS COMPARISON:  None. FINDINGS: Comminuted fracture of the right femoral shaft extending from the midportion to the supracondylar femur. On the AP view there is extension to the level of a total knee arthroplasty that is nondisplaced. Right hip hemiarthroplasty with lucent appearance around the femoral stem. Located knee and hip joint. IMPRESSION: 1. Displaced fracture of the right femoral shaft reaching the located knee arthroplasty. 2. Right hip hemiarthroplasty with lucency around the femoral stem. Electronically Signed   By: Monte Fantasia M.D.   On: 03/13/2017 19:47   Dg Femur Port, Min 2 Views Right  Result Date: 03/15/2017 CLINICAL DATA:  Open reduction and internal fixation of distal femur fracture. EXAM: RIGHT FEMUR PORTABLE 2 VIEW COMPARISON:  Intraoperative films of earlier today. FINDINGS: Right hip arthroplasty. Plate and screw  fixation of a spiral femoral shaft fractures. Prior total knee arthroplasty. No acute hardware complication. Improved alignment, with minimal medial and anterior displacement of the distal fractured fragment. IMPRESSION: Expected appearance after interval plate and screw fixation of mid femoral shaft fracture. Electronically Signed   By: Abigail Miyamoto M.D.   On: 03/15/2017 15:53      Scheduled Meds: . gabapentin  300 mg Oral TID  . hydrocortisone sod succinate (SOLU-CORTEF) inj  50 mg Intravenous Q12H  . insulin aspart  0-9 Units Subcutaneous TID WC  . linagliptin  5 mg Oral Daily  . metoprolol tartrate  12.5 mg Oral BID  . pantoprazole  40 mg Oral Daily  . ursodiol  300 mg Oral BID   Continuous Infusions: . 0.9 % NaCl with KCl 20 mEq / L 50 mL/hr at 03/14/17 1919  .  ceFAZolin (ANCEF) IV    . lactated ringers 10 mL/hr at 03/15/17 1216     LOS: 2 days    Time spent: Total of 15 minutes spent with pt, greater than 50% of which was spent in discussion of  treatment, counseling and coordination of care    Chipper Oman, MD Pager: Text Page via  [email or web address]   If 7PM-7AM, please contact night-coverage [email or web address] 03/15/2017, 4:18 PM

## 2017-03-15 NOTE — Anesthesia Procedure Notes (Addendum)
Procedure Name: Intubation Date/Time: 03/15/2017 1:05 PM Performed by: Lillia Abed, MD Pre-anesthesia Checklist: Patient identified, Emergency Drugs available and Suction available Patient Re-evaluated:Patient Re-evaluated prior to induction Oxygen Delivery Method: Circle system utilized Preoxygenation: Pre-oxygenation with 100% oxygen Induction Type: IV induction Ventilation: Mask ventilation without difficulty Laryngoscope Size: Miller and 2 Grade View: Grade I Tube type: Oral Tube size: 7.5 mm Number of attempts: 1 Airway Equipment and Method: Stylet Placement Confirmation: ETT inserted through vocal cords under direct vision and positive ETCO2 Secured at: 19 cm Tube secured with: Tape

## 2017-03-15 NOTE — Anesthesia Procedure Notes (Signed)
Performed by: Ossey, Kevin, MD       

## 2017-03-15 NOTE — Anesthesia Postprocedure Evaluation (Signed)
Anesthesia Post Note  Patient: [name]  Procedure(s) Performed: OPEN REDUCTION INTERNAL FIXATION (ORIF) DISTAL FEMUR FRACTURE (Right )     Patient location during evaluation: PACU Anesthesia Type: General Level of consciousness: awake and alert Pain management: pain level controlled Vital Signs Assessment: post-procedure vital signs reviewed and stable Respiratory status: spontaneous breathing, nonlabored ventilation, respiratory function stable and patient connected to nasal cannula oxygen Cardiovascular status: blood pressure returned to baseline and stable Postop Assessment: no apparent nausea or vomiting Anesthetic complications: no    Last Vitals:  Vitals:   03/15/17 1600 03/15/17 1619  BP: 121/66 (!) 109/52  Pulse: 78 86  Resp: 18 18  Temp: 36.5 C 36.7 C  SpO2: 97% 98%                 Effie Berkshire

## 2017-03-15 NOTE — Progress Notes (Signed)
Pt taken down to surgery. Report called to short stay.

## 2017-03-15 NOTE — Progress Notes (Signed)
PHARMACY NOTE:  ANTIMICROBIAL RENAL DOSAGE ADJUSTMENT  Current antimicrobial regimen includes a mismatch between antimicrobial dosage and estimated renal function.  As per policy approved by the Pharmacy & Therapeutics and Medical Executive Committees, the antimicrobial dosage will be adjusted accordingly.  Current antimicrobial dosage:  Ancef 2gm IV Q8H x 3 doses  Indication: Surgical prophylaxis  Renal Function:  Estimated Creatinine Clearance: 24 mL/min (A) (by C-G formula based on SCr of 1.35 mg/dL (H)). []      On intermittent HD, scheduled: []      On CRRT    Antimicrobial dosage has been changed to:  Anvef 2gm IV Q12H x 2 doses  Additional comments:   Thank you for allowing pharmacy to be a part of this patient's care.  Fraidy Mccarrick D. Mina Marble, PharmD, BCPS Pager:  [phone] 03/15/2017, 4:21 PM

## 2017-03-15 NOTE — Interval H&P Note (Signed)
History and Physical Interval Note:  03/15/2017 12:48 PM  [name]  has presented today for surgery, with the diagnosis of right distal femur fracture  The various methods of treatment have been discussed with the patient and family. After consideration of risks, benefits and other options for treatment, the patient has consented to  Procedure(s): OPEN REDUCTION INTERNAL FIXATION (ORIF) DISTAL FEMUR FRACTURE (Right) as a surgical intervention .  The patient's history has been reviewed, patient examined, no change in status, stable for surgery.  I have reviewed the patient's chart and labs.  Questions were answered to the patient's satisfaction.     Haddix, Thomasene Lot

## 2017-03-15 NOTE — Op Note (Signed)
OrthopaedicSurgeryOperativeNote ([id number]) Date of Surgery: 03/15/2017  Admit Date: 03/13/2017   Diagnoses: Pre-Op Diagnoses: Right femur periprosthetic fracture  Post-Op Diagnosis: Same  Procedures: CPT 27511-ORIF of right periprosthetic distal femur fracture  Surgeons: Primary: Shona Needles, MD   Assistant: Ainsley Spinner, PA-C  Location:[facility] OR ROOM 05   AnesthesiaGeneral   Antibiotics:Ancef 2g preop   Tourniquettime:None used  RCVELFYBOFBPZWCHEN:277 mL   Complications:None  Specimens:None  Implants: Implant Name Type Inv. Item Serial No. Manufacturer Lot No. LRB No. Used Action  SCREW NCB 4.8EU23N - [id number] Screw SCREW NCB 4.0GQ67Y  [employer]  Right 1 Implanted  SCREW NCB 4.0X36MM - [id number] Screw SCREW NCB 4.0X36MM  [employer]  Right 1 Implanted  SCREW NCB 5.0X36MM - [id number] Screw SCREW NCB 5.0X36MM  [employer]  Right 1 Implanted  SCREW CORTICAL NCB 5.0X65 - [id number] Screw SCREW CORTICAL NCB 5.0X65  [employer]  Right 1 Implanted  SCREW 5.0 70MM - [id number] Screw SCREW 5.0 70MM  [employer]  Right 1 Implanted  SCREW NCB 5.0X75MM - [id number] Screw SCREW NCB 5.0X75MM  [employer]  Right 3 Implanted  PLATE DISTAL FEMUR 32D 317M RT - [id number] Plate PLATE DISTAL FEMUR 96Q 317M RT  [employer]  Right 1 Implanted  SCREW NCB 4.0MX34M - [id number] Screw SCREW NCB 4.0MX34M  [employer]  Right 1 Implanted  SCREW 5.0 80MM - [id number] Screw SCREW 5.0 80MM  [employer]  Right 1 Implanted  CAP LOCK NCB - [id number] Cap CAP LOCK NCB  [employer]  Right 8 Implanted  SCREW UNI CORTICAL 5.0X14MM - [id number] Screw SCREW UNI CORTICAL 5.0X14MM  [employer]  Right 1 Implanted    IndicationsforSurgery: This is a 84 year old female who sustained a ground-level fall and had a right femur fracture between a total knee and right hemiarthroplasty.  She has multiple medical problems including atrial fibrillation  diastolic congestive heart failure, she is on Eliquis, she also has diabetes and chronic kidney disease.  Her Eliquis was stopped and she was medically optimized for the operating room.  I felt that her fracture cannot be treated nonoperatively.  I felt that the best option would be an open reduction internal fixation.  Risks and benefits were discussed with the patient and her granddaughter. Risks discussed included bleeding requiring blood transfusion, bleeding causing a hematoma, infection, malunion, nonunion, damage to surrounding nerves and blood vessels, pain, hardware prominence or irritation, hardware failure, stiffness, post-traumatic arthritis, DVT/PE, compartment syndrome, and even death. The patient and their family agreed to proceed with surgery and consent was obtained.  Operative Findings: Closed reduction and lateral distal femoral plating of right distal femoral shaft fracture fixed with [employer] NCB 15 hole plate.  Procedure: The patient was identified in the preoperative holding area. Consent was confirmed with the patient and their family and all questions were answered. The operative extremity was marked after confirmation with the patient. The patientwas then brought back to the operating room by our anesthesia colleagues. The patient was placed under general anesthesia and was carefully transferred over to a radiolucent flattop table. They were secured to the bed and all bony prominences were padded.  A bump was placed under the operative hip. The operative extremity was then prepped and draped in usual sterile fashion. A timeout was performed to verify the patient, the procedure and extremity. Preoperative antibiotics were dosed.  Fluoroscopy was used to identify the fracture. A lateral approach was made to the distal femur. It was taken down through the skin  and the IT band was incised in line with the incision. The distal portion of the vastus lateralis was reflected off the  IT band and the distal articular block of the lateral femoral condyle was exposed. Subperiosteal dissection was carried to the edges of the articular surface of the knee prosthesis. The fracture was palpated but I attempted to keep the soft tissue sleeve, intact to prevent devascularization. A reduction maneuver was performed to align the fracture in the AP and lateral planes.  I used fluoroscopy to identify the correct length plate to bridge the whole femoral shaft and proximal to the tip of the hip prothesis. I chose a 15-hole plate. The aiming arm was attached to the plate and slide submuscularly under the vastus to the proximal femur. A K-wire was used to appropriately align the distal portion of the plate. A drill sleeve was used to provisionally fix the proximal portion of the plate with a 5.1OA drill bit. A perfect lateral was obtained to show appropriate positioning of the plate.  At this moment the distal segment was in a little bit of valgus so as a result I placed a nonlocking 4.0 millimeter screw in the metaphysis to change the angulation of the distal fragment out of valgus and into a better overall alignment.  I then appropriately aligned the fracture in the AP and lateral planes and then placed a 4.3 mm drill bit in the proximal shaft just below the femoral prosthesis and placed a bicortical 5.0 millimeter screw.  At this point I removed the 3.3 mm drill bit in the proximal segment and placed a 4.0 millimeter screw.  3 distal 5.0 millimeter screws were placed into the distal articular block.  At the proximal hole of the plate I was able to place a bicortical 4.0 millimeter screw just posterior to the stem.  Place another unicortical screw in the proximal segment.  I then made a unicortical screw in the bicortical screw just distal to the stem locking and left the other 2 4.0 millimeter screws nonlocking.  I then removed the jig and placed a total of 6 screws in the distal segment.  Locking  caps were placed.  Final fluoroscopic images were obtained.  The incisions were irrigated with normal saline. A gram of vancomycin powder was placed in the incision around the plate. The IT band was closed with 0-vicryl. The skin was closed with 2-vicryl, 3-0 nylon. The incisions were dressed with Mepilex dressings and the leg was wrapped with ACE wraps. The patient was then transferred to the regular floor bed and taken to the PACU in stable condition.  Post Op Plan/Instructions: Patient will be weightbearing as tolerated to the right lower extremity.  She may be restarted on Eliquis on postoperative day 1.  She does not need a brace and no restrictions on knee range of motion.  I was present and performed the entire surgery.  Ainsley Spinner, PA-C did assist me throughout the case. An assistant was necessary given the difficulty in approach, maintenance of reduction and ability to instrument the fracture.   Katha Hamming, MD [facility]

## 2017-03-16 ENCOUNTER — Encounter (HOSPITAL_COMMUNITY): Payer: Self-pay | Admitting: Student

## 2017-03-16 ENCOUNTER — Other Ambulatory Visit: Payer: Self-pay

## 2017-03-16 DIAGNOSIS — I4891 Unspecified atrial fibrillation: Secondary | ICD-10-CM

## 2017-03-16 LAB — BASIC METABOLIC PANEL
ANION GAP: 11 (ref 5–15)
BUN: 25 mg/dL — ABNORMAL HIGH (ref 6–20)
CHLORIDE: 101 mmol/L (ref 101–111)
CO2: 21 mmol/L — AB (ref 22–32)
Calcium: 8.4 mg/dL — ABNORMAL LOW (ref 8.9–10.3)
Creatinine, Ser: 1.3 mg/dL — ABNORMAL HIGH (ref 0.44–1.00)
GFR calc Af Amer: 41 mL/min — ABNORMAL LOW (ref >60)
GFR calc non Af Amer: 35 mL/min — ABNORMAL LOW (ref >60)
GLUCOSE: 311 mg/dL — AB (ref 65–99)
Potassium: 4.6 mmol/L (ref 3.5–5.1)
Sodium: 133 mmol/L — ABNORMAL LOW (ref 135–145)

## 2017-03-16 LAB — CBC
HCT: 25.5 % — ABNORMAL LOW (ref 36.0–46.0)
Hemoglobin: 7.9 g/dL — ABNORMAL LOW (ref 12.0–15.0)
MCH: 30.2 pg (ref 26.0–34.0)
MCHC: 31 g/dL (ref 30.0–36.0)
MCV: 97.3 fL (ref 78.0–100.0)
PLATELETS: 149 10*3/uL — AB (ref 150–400)
RBC: 2.62 MIL/uL — AB (ref 3.87–5.11)
RDW: 14.7 % (ref 11.5–15.5)
WBC: 11.4 10*3/uL — AB (ref 4.0–10.5)

## 2017-03-16 LAB — GLUCOSE, CAPILLARY
GLUCOSE-CAPILLARY: 242 mg/dL — AB (ref 65–99)
GLUCOSE-CAPILLARY: 232 mg/dL — AB (ref 65–99)
Glucose-Capillary: 332 mg/dL — ABNORMAL HIGH (ref 65–99)
Glucose-Capillary: 304 mg/dL — ABNORMAL HIGH (ref 65–99)

## 2017-03-16 LAB — VITAMIN D 25 HYDROXY (VIT D DEFICIENCY, FRACTURES): Vit D, 25-Hydroxy: 44.4 ng/mL (ref 30.0–100.0)

## 2017-03-16 LAB — CALCITRIOL (1,25 DI-OH VIT D): Vit D, 1,25-Dihydroxy: 30.3 pg/mL (ref 19.9–79.3)

## 2017-03-16 MED ORDER — APIXABAN 5 MG PO TABS
5.0000 mg | ORAL_TABLET | Freq: Two times a day (BID) | ORAL | Status: DC
  Administered 2017-03-16 – 2017-03-17 (×3): 5 mg via ORAL
  Filled 2017-03-16 (×3): qty 1

## 2017-03-16 NOTE — Plan of Care (Signed)
  Progressing Pain Managment: General experience of comfort will improve 03/16/2017 0355 - Progressing by [place] Pugh, RN Safety: Ability to remain free from injury will improve 03/16/2017 0355 - Progressing by [place] Pugh, RN Pain Management: Pain level will decrease 03/16/2017 0355 - Progressing by [place] Pugh, RN

## 2017-03-16 NOTE — Progress Notes (Signed)
Inpatient Diabetes Program Recommendations  AACE/ADA: New Consensus Statement on Inpatient Glycemic Control (2015)  Target Ranges:  Prepandial:   less than 140 mg/dL      Peak postprandial:   less than 180 mg/dL (1-2 hours)      Critically ill patients:  140 - 180 mg/dL   Results for [name], [name] (MRN [id number]) as of 03/16/2017 08:41  Ref. Range 03/15/2017 06:16 03/15/2017 11:53 03/15/2017 15:17 03/15/2017 17:36 03/15/2017 21:51 03/16/2017 06:20  Glucose-Capillary Latest Ref Range: 65 - 99 mg/dL 221 (H) 130 (H) 103 (H) 109 (H) 214 (H) 332 (H)   Review of Glycemic Control  Diabetes history: DM2 Outpatient Diabetes medications: Amaryl 2 mg daily, Tradjenta 5 mg daily; Prednisone 3 mg QAM Current orders for Inpatient glycemic control: Tradjenta 5 mg daily, Novolog 0-9 units TID with meals; Solucortef 50 mg Q12H  Inpatient Diabetes Program Recommendations:  Correction (SSI): Please consider ordering Novolog 0-5 units QHS for bedtime correction. Insulin - Meal Coverage: Noted patient received Decadron 10 mg X 1 on 03/15/17 and is ordered Solucortef 50 mg Q12H which are both contributing to hyperglycemia. If steroids will be continued, please consider ordering Novolog 2 units TID with meals. Diet: If appropriate, please discontinue regular diet and order Carb Modified diet.  Thanks, Barnie Alderman, RN, MSN, CDE Diabetes Coordinator Inpatient Diabetes Program [phone] (Team Pager from 8am to 5pm)

## 2017-03-16 NOTE — Discharge Instructions (Signed)

## 2017-03-16 NOTE — Care Management (Signed)
Case manager spoke with patient concerning discharge plans. Patient lives alone and says her granddaughter comes by daily. Patient is agreeable to going to SNF for shortterm rehab as long as she is in [place] and where her granddaughter can get to her. Case manager relayed this information to Education officer, museum.

## 2017-03-16 NOTE — Evaluation (Signed)
Physical Therapy Evaluation Patient Details Name: [name] MRN: [id number] DOB: August 18, 1924 Today's Date: 03/16/2017   History of Present Illness  84 y.o. female s/p R HIP ORIF 1/09. PMH includes: CHF, PONV, DM, TIA, Diabetic peripheral neuropathy, COPD, BL TKA,  R THA, Lumbar surgery, cervical spinal fusion, Distal Radial ORIF.     Clinical Impression  Patient is s/p above surgery resulting in functional limitations due to the deficits listed below (see PT Problem List). PTA, patient was living alone in entry level apartment, ambulating with [place] in community and receiving some assistance with ADLs from granddaughter. Upon evaluation, patient experiencing high levels of post op pain and weakness that greatly limit her ability to mobilize or participate in therapy today. Currently max A for bed mobility and unable to sit into bedside chair. Notified RN of pain levels. Patient states only wishes to return home, which would be unsafe at this point given her current limitations. Discussed benefits of SNF and she said she would think about it. Patient will benefit from skilled PT to increase their independence and safety with mobility to allow discharge to the venue listed below.       Follow Up Recommendations SNF;Supervision/Assistance - 24 hour    Equipment Recommendations  None recommended by PT    Recommendations for Other Services       Precautions / Restrictions Precautions Precautions: Fall Restrictions Weight Bearing Restrictions: No      Mobility  Bed Mobility Overal bed mobility: Needs Assistance Bed Mobility: Supine to Sit     Supine to sit: Max assist     General bed mobility comments: Max A to assist with RLE and trunk control during bed mobility. patient limited by high pain levels at this time.   Transfers                 General transfer comment: unable to attempt due to pain  Ambulation/Gait             General Gait Details:  unable  Stairs            Wheelchair Mobility    Modified Rankin (Stroke Patients Only)       Balance Overall balance assessment: Needs assistance Sitting-balance support: Feet unsupported;Single extremity supported Sitting balance-Leahy Scale: Poor Sitting balance - Comments: patient leaning to left at EOB to avoid painful RLE.                                      Pertinent Vitals/Pain Pain Assessment: 0-10 Pain Score: 10-Worst pain ever Pain Location: R Leg/Hip Pain Descriptors / Indicators: Constant;Grimacing;Discomfort;Moaning;Operative site guarding;Crying Pain Intervention(s): Limited activity within patient's tolerance;Monitored during session;Premedicated before session;Repositioned    Home Living Family/patient expects to be discharged to:: Private residence Living Arrangements: Alone Available Help at Discharge: Family Type of Home: Apartment Home Access: Level entry     Home Layout: One level Home Equipment: Cane - quad;Cane - single point;Walker - 4 wheels;Tub bench;Hand held shower head;Adaptive equipment      Prior Function Level of Independence: Independent with assistive device(s)         Comments: grandaughter comes by daily to assist with some ADLs. ambulating in home without AD, in community with [facility]     Hand Dominance        Extremity/Trunk Assessment   Upper Extremity Assessment Upper Extremity Assessment: Defer to OT evaluation    Lower  Extremity Assessment Lower Extremity Assessment: RLE deficits/detail;Generalized weakness RLE Deficits / Details: RLE unable to assess due to pain       Communication   Communication: No difficulties  Cognition Arousal/Alertness: Awake/alert Behavior During Therapy: WFL for tasks assessed/performed;Anxious Overall Cognitive Status: Within Functional Limits for tasks assessed                                 General Comments: Anxious and fearfull over pain       General Comments General comments (skin integrity, edema, etc.): Patient extremly painful this session, RN notified.    Exercises     Assessment/Plan    PT Assessment Patient needs continued PT services  PT Problem List Decreased strength;Decreased range of motion;Decreased activity tolerance;Decreased balance;Decreased mobility;Decreased knowledge of use of DME;Decreased safety awareness;Pain;Decreased cognition;Decreased coordination       PT Treatment Interventions DME instruction;Gait training;Stair training;Functional mobility training;Therapeutic activities;Therapeutic exercise    PT Goals (Current goals can be found in the Care Plan section)  Acute Rehab PT Goals Patient Stated Goal: to go home PT Goal Formulation: With patient Time For Goal Achievement: 03/23/17 Potential to Achieve Goals: Poor    Frequency Min 3X/week   Barriers to discharge Decreased caregiver support Lives Alone    Co-evaluation PT/OT/SLP Co-Evaluation/Treatment: Yes Reason for Co-Treatment: For patient/therapist safety PT goals addressed during session: Mobility/safety with mobility;Strengthening/ROM;Proper use of DME         AM-PAC PT "6 Clicks" Daily Activity  Outcome Measure Difficulty turning over in bed (including adjusting bedclothes, sheets and blankets)?: Unable Difficulty moving from lying on back to sitting on the side of the bed? : Unable Difficulty sitting down on and standing up from a chair with arms (e.g., wheelchair, bedside commode, etc,.)?: Unable Help needed moving to and from a bed to chair (including a wheelchair)?: Total Help needed walking in hospital room?: Total Help needed climbing 3-5 steps with a railing? : Total 6 Click Score: 6    End of Session Equipment Utilized During Treatment: Gait belt Activity Tolerance: Patient limited by pain Patient left: in bed;with call bell/phone within reach Nurse Communication: Mobility status PT Visit Diagnosis:  Unsteadiness on feet (R26.81);Other abnormalities of gait and mobility (R26.89);Muscle weakness (generalized) (M62.81);History of falling (Z91.81);Pain Pain - Right/Left: Right Pain - part of body: Leg;Hip    Time: 1130-1200 PT Time Calculation (min) (ACUTE ONLY): 30 min   Charges:   PT Evaluation $PT Eval Moderate Complexity: 1 Mod     PT G Codes:        Reinaldo Berber, PT, DPT Acute Rehab Services Pager: [phone]    Reinaldo Berber 03/16/2017, 12:30 PM

## 2017-03-16 NOTE — Social Work (Addendum)
CSW met with granddaughter at bedside to discuss SNF options and insurance.  CSW provided her a list of SNF offers.  CSW will f/u for SNF placement and Initiate Insurance Auth when SNF selected.  4:34pm Family desires Environmental education officer and CSW spoke with Elyse Hsu in admission who will have nurse review and let CSW know if they can offer.  5:15pm: CSW spoke back to granddaughter and indicated that we need another SNF just in case [facility] does not have any beds. Family selected [facility] as the backup if they do not receive a bed offer from [facility].   CSW will f/u.  Elissa Hefty, LCSW Clinical Social Worker [phone]

## 2017-03-16 NOTE — Progress Notes (Signed)
Orthopaedic Trauma Progress Note  S: Doing well, pain controlled. Elevated sugars overnight  O:  Vitals:   03/16/17 0030 03/16/17 0509  BP: (!) 97/56 (!) 103/58  Pulse: 86 79  Resp: 12 13  Temp: 97.6 F (36.4 C) 97.9 F (36.6 C)  SpO2: 98% 95%   Gen: Awake and alert, cooperative and pleasant RLE: Dressing clean, dry and intact. Compartments soft and compressible. Neurovascularly intact  Imaging: Postop x-rays of the right femur show well alignment with no signs of hardware failure loosening everything is in good position.  Labs: CBC pending this morning  A/P: 84 year old female with history of CHF, diabetes, A. fib on Eliquis with a right periprosthetic femoral shaft fracture.  Status post ORIF on 03/15/2017  -Allow for weightbearing as tolerated to right lower extremity -May restart Eliquis today. -Monitor CBC for possible need for transfusion PT OT -PT OT -Blood sugar control  Shona Needles, MD [facility] [phone] (phone)

## 2017-03-16 NOTE — Clinical Social Work Note (Signed)
Clinical Social Work Assessment  Patient Details  Name: [name] MRN: [id number] Date of Birth: 1924-05-06  Date of referral:  03/16/17               Reason for consult:  Facility Placement                Permission sought to share information with:  Chartered certified accountant granted to share information::  Yes, Verbal Permission Granted  Name::     [place]::  SNF  Relationship::     Contact Information:     Housing/Transportation Living arrangements for the past 2 months:  [place] of Information:  Patient, Other (Comment Required)(granddaughter) Patient Interpreter Needed:  None Criminal Activity/Legal Involvement Pertinent to Current Situation/Hospitalization:  No - Comment as needed Significant Relationships:  Other Family Members Lives with:  Self Do you feel safe going back to the place where you live?  No Need for family participation in patient care:  Yes (Comment)  Care giving concerns:  Pt resides alone and with new impairment cannot return home alone. Pt will need rehab to address impairment.  CSW obtained permission to speak with granddaughter. Pt ambivalent about going to SNF vs. Home and really desires home. CSW spoke to granddaughter and she is in agreement with SNF placement. CSW will provide patient/family SNF bed offers.  CSW discussed Insurance auth process with granddaughter and answered all questions.  Social Worker assessment / plan:  CSW will f/u for disposition.   Employment status:  Retired Nurse, adult PT Recommendations:  [place] / Referral to community resources:  [place]  Patient/Family's Response to care:  Patient/family thanked CSW for speaking with her about SNF placement. No other issues identified at this time.  Patient/Family's Understanding of and Emotional Response to Diagnosis, Current Treatment, and Prognosis:   Patient desires to go home in spite of her impairment. Pt understands that she has limitations and no one to care for her at home however she desires to go home. CSW validated her concerns. CSW discussed with granddaughter and she is in agreement with the plan for SNF placement. No other issues or concerns at this time.  Emotional Assessment Appearance:  Appears stated age Attitude/Demeanor/Rapport:  (Cooperative) Affect (typically observed):  Accepting, Appropriate Orientation:  Oriented to Situation, Oriented to  Time, Oriented to Place, Oriented to Self Alcohol / Substance use:  Not Applicable Psych involvement (Current and /or in the community):  No (Comment)  Discharge Needs  Concerns to be addressed:  Discharge Planning Concerns Readmission within the last 30 days:  Yes Current discharge risk:  Dependent with Mobility, Lives alone, Physical Impairment Barriers to Discharge:  No Barriers Identified   Normajean Baxter, LCSW 03/16/2017, 4:16 PM

## 2017-03-16 NOTE — Evaluation (Signed)
Occupational Therapy Evaluation Patient Details Name: [name] MRN: [id number] DOB: [birth year] Today's Date: 03/16/2017    History of Present Illness 84 y.o. female s/p R HIP ORIF 1/09. PMH includes: CHF, PONV, DM, TIA, Diabetic peripheral neuropathy, COPD, BL TKA,  R THA, Lumbar surgery, cervical spinal fusion, Distal Radial ORIF.    Clinical Impression   Pt admitted with the above diagnoses and presents with below problem list. Pt will benefit from continued OT to address the below listed deficits and maximize independence with BADLs. PTA pt was independent with ADLs. Pt is max -total A with LB ADLs. Unable to complete transfers this session due to 10/10 pain. WIll follow.      Follow Up Recommendations  SNF;Supervision/Assistance - 24 hour    Equipment Recommendations  3 in 1 bedside commode    Recommendations for Other Services       Precautions / Restrictions Precautions Precautions: Fall Restrictions Weight Bearing Restrictions: No Other Position/Activity Restrictions: WBAT      Mobility Bed Mobility Overal bed mobility: Needs Assistance Bed Mobility: Supine to Sit     Supine to sit: Max assist     General bed mobility comments: Max A to assist with RLE and trunk control during bed mobility. patient limited by high pain levels at this time.   Transfers                 General transfer comment: unable to attempt due to pain    Balance Overall balance assessment: Needs assistance Sitting-balance support: Feet unsupported;Single extremity supported Sitting balance-Leahy Scale: Poor Sitting balance - Comments: patient leaning to left at EOB to avoid painful RLE.                                    ADL either performed or assessed with clinical judgement   ADL Overall ADL's : Needs assistance/impaired Eating/Feeding: Set up;Bed level   Grooming: Set up;Bed level   Upper Body Bathing: Sitting;Maximal assistance Upper Body Bathing  Details (indicate cue type and reason): pt at EOB leaning heavily to left side due to pain.  Lower Body Bathing: Total assistance;Bed level;Maximal assistance   Upper Body Dressing : Maximal assistance;Sitting   Lower Body Dressing: Maximal assistance;Total assistance;Bed level                 General ADL Comments: Pt completed bed mobility. Pt EOB about 2 minutes, very high pain. Heavy guarding of RLE. Did not come to full trunk upright positon.      Vision         Perception     Praxis      Pertinent Vitals/Pain Pain Assessment: 0-10 Pain Score: 10-Worst pain ever Pain Location: R Leg/Hip Pain Descriptors / Indicators: Constant;Grimacing;Discomfort;Moaning;Operative site guarding;Crying Pain Intervention(s): Limited activity within patient's tolerance;Monitored during session;Repositioned     Hand Dominance     Extremity/Trunk Assessment Upper Extremity Assessment Upper Extremity Assessment: Generalized weakness   Lower Extremity Assessment Lower Extremity Assessment: Defer to PT evaluation RLE Deficits / Details: RLE unable to assess due to pain       Communication Communication Communication: No difficulties   Cognition Arousal/Alertness: Awake/alert Behavior During Therapy: WFL for tasks assessed/performed;Anxious Overall Cognitive Status: Within Functional Limits for tasks assessed  General Comments: Anxious and fearfull over pain   General Comments  Patient extremly painful this session, RN notified. Encouraged some breathing exercises EOB.    Exercises     Shoulder Instructions      Home Living Family/patient expects to be discharged to:: Private residence Living Arrangements: Alone Available Help at Discharge: Family Type of Home: Apartment Home Access: Level entry     Home Layout: One level     Bathroom Shower/Tub: Tub/shower unit         Home Equipment: Cane - quad;Cane - single  point;Walker - 4 wheels;Tub bench;Hand held shower head;Adaptive equipment   Additional Comments: Pt states grandaughter lives nearby and comes by every day to help her around the house.       Prior Functioning/Environment Level of Independence: Independent with assistive device(s)        Comments: grandaughter comes by daily to assist with some ADLs. ambulating in home without AD, in [facility]        OT Problem List: Decreased strength;Impaired balance (sitting and/or standing);Decreased knowledge of use of DME or AE;Decreased knowledge of precautions;Pain      OT Treatment/Interventions: Self-care/ADL training;DME and/or AE instruction;Therapeutic activities;Patient/family education;Balance training    OT Goals(Current goals can be found in the care plan section) Acute Rehab OT Goals Patient Stated Goal: to go home OT Goal Formulation: With patient Time For Goal Achievement: 03/30/17 Potential to Achieve Goals: Good  OT Frequency: Min 2X/week   Barriers to D/C:            Co-evaluation PT/OT/SLP Co-Evaluation/Treatment: Yes Reason for Co-Treatment: For patient/therapist safety PT goals addressed during session: Mobility/safety with mobility;Strengthening/ROM;Proper use of DME OT goals addressed during session: ADL's and self-care      AM-PAC PT "6 Clicks" Daily Activity     Outcome Measure Help from another person eating meals?: None Help from another person taking care of personal grooming?: A Little Help from another person toileting, which includes using toliet, bedpan, or urinal?: Total Help from another person bathing (including washing, rinsing, drying)?: Total Help from another person to put on and taking off regular upper body clothing?: A Lot Help from another person to put on and taking off regular lower body clothing?: A Lot 6 Click Score: 13   End of Session Nurse Communication: Other (comment)(Pain limiting session. )  Activity Tolerance:  Patient limited by pain Patient left: in bed;with call bell/phone within reach  OT Visit Diagnosis: Unsteadiness on feet (R26.81);Pain                Time: 1129-1205 OT Time Calculation (min): 36 min Charges:  OT General Charges $OT Visit: 1 Visit OT Evaluation $OT Eval Low Complexity: 1 Low G-Codes:       Hortencia Pilar 03/16/2017, 1:53 PM

## 2017-03-16 NOTE — NC FL2 (Signed)
[place] LEVEL OF CARE SCREENING TOOL     IDENTIFICATION  Patient Name: [name] Birthdate: 1924/05/03 Sex: female Admission Date (Current Location): 03/13/2017  [facility] and [place] Number:  Herbalist and Address:  [facility] [place], [place] [address], [place], [place] [address]      Provider Number: [id number]  Attending Physician Name and Address:  Patrecia Pour, Christean Grief, MD  Relative Name and Phone Number:  Rita Ohara, granddaughter, [phone]    Current Level of Care: Hospital Recommended Level of Care: [place] Prior Approval Number:    Date Approved/Denied:   PASRR Number: [id number]  Discharge Plan: SNF    Current Diagnoses: Patient Active Problem List   Diagnosis Date Noted  . Atrial fibrillation ([place]) 03/13/2017  . Closed fracture of condyle of right femur ([place]) 03/13/2017  . Type 2 diabetes mellitus with renal manifestations ([place]) 03/13/2017  . Femur fracture ([place]) 03/13/2017  . Atrophic vaginitis 12/11/2013  . UTI (lower urinary tract infection) 11/16/2013  . Acute encephalopathy 11/16/2013  . FTT (failure to thrive) in adult 11/16/2013  . Subdural hematoma ([place]) 11/06/2013  . Fall 11/06/2013  . Hematoma of leg 09/04/2013  . Generalized osteoarthritis of hand 09/04/2013  . Cerumen impaction 09/04/2013  . Rales 01/15/2013  . Chronic kidney disease, stage III (moderate) ([place]) 12/19/2012  . Sinus congestion 10/24/2012  . Fracture of femoral neck, right, closed ([place]) 08/02/2012  . Hemoperitoneum (nontraumatic) 06/19/2012  . Malignant neoplasm of renal pelvis ([place]) 06/19/2012  . Dysphagia, unspecified(787.20) 06/19/2012  . Urinary frequency 06/19/2012  . Unspecified late effects of cerebrovascular disease 06/19/2012  . Personal history of fall 06/19/2012  . Abnormality of gait 06/19/2012  . Synovial cyst of popliteal space 06/19/2012  . Muscle weakness (generalized) 06/19/2012  .  Osteoporosis, unspecified 06/19/2012  . Anxiety state, unspecified 06/19/2012  . Osteoarthrosis, unspecified whether generalized or localized, unspecified site 06/19/2012  . Pain in limb 06/19/2012  . Knee joint replacement by other means 06/19/2012  . Pain in joint, lower leg 06/19/2012  . Hyperlipidemia 06/19/2012  . Cervicalgia 06/19/2012  . Lack of coordination 06/19/2012  . Pain in joint, shoulder region 06/19/2012  . Dermatophytosis of nail 06/19/2012  . Unspecified urinary incontinence 06/19/2012  . Herpes zoster without mention of complication 50/27/7412  . Unspecified hereditary and idiopathic peripheral neuropathy 06/19/2012  . Osteoarthrosis, unspecified whether generalized or localized, lower leg 06/19/2012  . Lumbago 06/19/2012  . Enthesopathy of hip region 06/19/2012  . Internal hemorrhoids without mention of complication 87/86/7672  . Diaphragmatic hernia without mention of obstruction or gangrene 06/19/2012  . Renal abscess, right 06/11/2012  . Recurrent UTI 06/07/2012  . SIRS (systemic inflammatory response syndrome) ([place]) 06/07/2012  . Retroperitoneal bleed 03/20/2012  . Chronic heart failure ([place]) 03/20/2012  . 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 Degenerative arthritis of cervical spine 02/09/2011  . Degenerative arthritis of hip 02/09/2011  . Degenerative arthritis of knee 02/09/2011  . Carpal tunnel syndrome of right wrist 02/09/2011  . TIA (transient ischemic attack) 02/09/2011  . Anemia 01/31/2011  . Hypoxia 01/31/2011  . Renal oncocytoma 01/12/2011  . Type II or unspecified type diabetes mellitus with renal manifestations, uncontrolled(250.42) 01/11/2011  . Essential hypertension 01/11/2011  . Polymyalgia rheumatica ([place]) 01/11/2011  . Depression 01/11/2011    Orientation RESPIRATION BLADDER Height & Weight     Self, Place, Situation  Normal Incontinent Weight: 144 lb (65.3 kg) Height:  5' 0.98" (154.9 cm)  BEHAVIORAL SYMPTOMS/MOOD  NEUROLOGICAL BOWEL NUTRITION STATUS      Continent Diet(See DC Summary)  AMBULATORY STATUS COMMUNICATION OF NEEDS Skin   Extensive Assist Verbally Surgical wounds                       Personal Care Assistance Level of Assistance  Dressing, Bathing, Feeding Bathing Assistance: Maximum assistance Feeding assistance: Limited assistance Dressing Assistance: Maximum assistance     Functional Limitations Info  Sight, Hearing, Speech Sight Info: Adequate Hearing Info: Adequate Speech Info: Adequate    SPECIAL CARE FACTORS FREQUENCY  PT (By licensed PT), OT (By licensed OT)     PT Frequency: 5x week OT Frequency: 5x week            Contractures      Additional Factors Info  Code Status, Allergies, Isolation Precautions, Insulin Sliding Scale Code Status Info: Full Allergies Info: ALLEGRA FEXOFENADINE HCL, LEVAQUIN LEVOFLOXACIN IN D5W, TUSSIONEX PENNKINETIC ER HYDROCOD POLST-CPM POLST ER, CODEINE, MORPHINE AND RELATED    Insulin Sliding Scale Info: Insulin daily Isolation Precautions Info: OMDRO     Current Medications (03/16/2017):  This is the current hospital active medication list Current Facility-Administered Medications  Medication Dose Route Frequency Provider Last Rate Last Dose  . 0.9 % NaCl with KCl 20 mEq/ L  infusion   Intravenous Continuous Ainsley Spinner, PA-C 50 mL/hr at 03/14/17 1919    . apixaban (ELIQUIS) tablet 5 mg  5 mg Oral BID Patrecia Pour, Christean Grief, MD   5 mg at 03/16/17 1026  . fentaNYL (SUBLIMAZE) injection 25 mcg  25 mcg Intravenous Q2H PRN Rise Patience, MD   25 mcg at 03/16/17 1237  . gabapentin (NEURONTIN) capsule 300 mg  300 mg Oral TID Rise Patience, MD   300 mg at 03/16/17 1026  . hydrocortisone sodium succinate (SOLU-CORTEF) 100 MG injection 50 mg  50 mg Intravenous Q12H Rise Patience, MD   50 mg at 03/16/17 1127  . insulin aspart (novoLOG) injection 0-9 Units  0-9 Units Subcutaneous TID WC Rise Patience, MD   3  Units at 03/16/17 1237  . lactated ringers infusion   Intravenous Continuous Lillia Abed, MD 10 mL/hr at 03/15/17 2157    . linagliptin (TRADJENTA) tablet 5 mg  5 mg Oral Daily Rise Patience, MD   5 mg at 03/16/17 1026  . metoprolol tartrate (LOPRESSOR) tablet 12.5 mg  12.5 mg Oral BID Rise Patience, MD   12.5 mg at 03/16/17 1026  . oxybutynin (DITROPAN) tablet 5 mg  5 mg Oral TID PRN Rise Patience, MD      . pantoprazole (PROTONIX) EC tablet 40 mg  40 mg Oral Daily Rise Patience, MD   40 mg at 03/16/17 1026  . ursodiol (ACTIGALL) capsule 300 mg  300 mg Oral BID Lavina Hamman, MD   300 mg at 03/16/17 1026     Discharge Medications: Please see discharge summary for a list of discharge medications.  Relevant Imaging Results:  Relevant Lab Results:   Additional Information SS#: [id number]  Normajean Baxter, LCSW

## 2017-03-16 NOTE — Progress Notes (Signed)
PROGRESS NOTE [facility] [id number] SINDA Owens   QPY:[id number] DOB: 1924/06/28  DOA: 03/13/2017 PCP: Patient, No Pcp Per   Brief Narrative:  [name] is a 84 year old female with PMH of A. fib, chronic diastolic CHF, type II DM, PMR, CKD; admitted on 03/13/2017, presented with complaint of fall, was found to have femur fracture.  Admitted for surgical repair.  Patient underwent on ORIF of the right femur.  Subjective: Patient seen and examined, doing well. No pain worked with PT. Pain well controlled   Assessment & Plan: Right femoral fracture/mechanical fall Status post ORIF 03/15/17 Continue management per orthopedic PT recommending SNF   A. fib Currently on A. fib but rate controlled Continue beta-blocker Per orthopedic may resume   Polymyalgia rheumatica On chronic steroids Continue stress dose steroid for 1 more day the resume home dose  Chronic kidney disease stage III Creatinine remains at baseline   Anemia of chronic disease - superimposed acute blood loss - likely from surgery, hemodilution also contributing - d/c IVF  No overt bleeding  Continue to monitor  Transfuse if Hgb less than 7   Chronic diastolic CHF Stable no signs of fluid overload  Resume home Lasix in AM  D/c IVF   Type 2 diabetes mellitus Holding home oral hypoglycemic agent Patient on sliding scale CBGs stable Continue to monitor  DVT prophylaxis: SCDs Code Status: Full code Family Communication: Family at bedside Disposition Plan: Likely SNF when cleared by orthopedic surgery  Consultants:   Orthopedic surgery  Procedures:   ORIF 03/15/2017  Antimicrobials: Anti-infectives (From admission, onward)   Start     Dose/Rate Route Frequency Ordered Stop   03/15/17 2100  ceFAZolin (ANCEF) IVPB 2g/100 mL premix     2 g 200 mL/hr over 30 Minutes Intravenous Every 12 hours 03/15/17 1618 03/16/17 1118   03/15/17 1436  vancomycin (VANCOCIN) powder  Status:  Discontinued       As  needed 03/15/17 1437 03/15/17 1507   03/14/17 1600  ceFAZolin (ANCEF) powder 2 g  Status:  Discontinued     2 g Other To Surgery 03/14/17 1218 03/14/17 1241   03/14/17 1600  ceFAZolin (ANCEF) IVPB 2g/100 mL premix     2 g 200 mL/hr over 30 Minutes Intravenous  Once 03/14/17 1240 03/15/17 1300   03/14/17 1245  ceFAZolin (ANCEF) powder 1 g  Status:  Discontinued     1 g Other To Surgery 03/14/17 1235 03/14/17 1236      Objective: Vitals:   03/15/17 1700 03/15/17 2029 03/16/17 0030 03/16/17 0509  BP: 113/68 116/61 (!) 97/56 (!) 103/58  Pulse: 84 100 86 79  Resp: 18 15 12 13   Temp: 98 F (36.7 C) 98.5 F (36.9 C) 97.6 F (36.4 C) 97.9 F (36.6 C)  TempSrc: Oral Oral Axillary Oral  SpO2: 93% 98% 98% 95%  Weight:      Height:        Intake/Output Summary (Last 24 hours) at 03/16/2017 1619 Last data filed at 03/16/2017 1500 Gross per 24 hour  Intake 1702.33 ml  Output 1200 ml  Net 502.33 ml   Filed Weights   03/14/17 1040 03/15/17 1215  Weight: 65.3 kg (144 lb) 65.3 kg (144 lb)    Examination:  General: Pt is alert, awake, not in acute distress Cardiovascular: RRR, S1/S2 +, no rubs, no gallops Respiratory: CTA bilaterally, no wheezing, no rhonchi Abdominal: Soft, NT, ND, bowel sounds + Extremities: no edema, no cyanosis  Data Reviewed: I  have personally reviewed following labs and imaging studies  CBC: Recent Labs  Lab 03/13/17 2010 03/14/17 1120 03/15/17 0702 03/16/17 1452  WBC 10.0 11.7* 9.9 11.4*  NEUTROABS 7.2 7.5  --   --   HGB 10.9* 10.3* 9.1* 7.9*  HCT 34.9* 32.6* 28.7* 25.5*  MCV 97.2 96.7 96.3 97.3  PLT 125* 154 121* 093*   Basic Metabolic Panel: Recent Labs  Lab 03/13/17 2010 03/14/17 1120 03/15/17 0702 03/16/17 0942  NA 135 136 132* 133*  K 4.8 4.2 4.9 4.6  CL 105 102 101 101  CO2 22 21* 22 21*  GLUCOSE 135* 129* 230* 311*  BUN 27* 29* 31* 25*  CREATININE 1.19* 1.23* 1.35* 1.30*  CALCIUM 9.1 9.0 8.6* 8.4*  MG  --   --  1.7  --     GFR: Estimated Creatinine Clearance: 24.9 mL/min (A) (by C-G formula based on SCr of 1.3 mg/dL (H)). Liver Function Tests: Recent Labs  Lab 03/14/17 1120  AST 31  ALT 30  ALKPHOS 68  BILITOT 1.1  PROT 6.2*  ALBUMIN 3.3*   No results for input(s): LIPASE, AMYLASE in the last 168 hours. No results for input(s): AMMONIA in the last 168 hours. Coagulation Profile: Recent Labs  Lab 03/14/17 1120  INR 1.28   Cardiac Enzymes: No results for input(s): CKTOTAL, CKMB, CKMBINDEX, TROPONINI in the last 168 hours. BNP (last 3 results) No results for input(s): PROBNP in the last 8760 hours. HbA1C: No results for input(s): HGBA1C in the last 72 hours. CBG: Recent Labs  Lab 03/15/17 1517 03/15/17 1736 03/15/17 2151 03/16/17 0620 03/16/17 1233  GLUCAP 103* 109* 214* 332* 242*   Lipid Profile: No results for input(s): CHOL, HDL, LDLCALC, TRIG, CHOLHDL, LDLDIRECT in the last 72 hours. Thyroid Function Tests: No results for input(s): TSH, T4TOTAL, FREET4, T3FREE, THYROIDAB in the last 72 hours. Anemia Panel: No results for input(s): VITAMINB12, FOLATE, FERRITIN, TIBC, IRON, RETICCTPCT in the last 72 hours. Sepsis Labs: No results for input(s): PROCALCITON, LATICACIDVEN in the last 168 hours.  Recent Results (from the past 240 hour(s))  Surgical PCR screen     Status: None   Collection Time: 03/14/17  2:47 PM  Result Value Ref Range Status   MRSA, PCR NEGATIVE NEGATIVE Final   Staphylococcus aureus NEGATIVE NEGATIVE Final    Comment: (NOTE) The Xpert SA Assay (FDA approved for NASAL specimens in patients 27 years of age and older), is one component of a comprehensive surveillance program. It is not intended to diagnose infection nor to guide or monitor treatment.     Radiology Studies: Dg C-arm 1-60 Min  Result Date: 03/15/2017 CLINICAL DATA:  ORIF femur fracture. EXAM: DG C-ARM 61-120 MIN; RIGHT FEMUR 2 VIEWS COMPARISON:  03/13/2017 FINDINGS: Multiple C-arm images show  restoration of anatomic alignment at the femoral diaphyseal fracture site with lateral plate and screw fixation. Components appear well positioned. No radiographically detectable complication. Lateral view at the end shows slight separation of the main fragments but reasonably close apposition. IMPRESSION: ORIF femoral diaphyseal fracture. Electronically Signed   By: Nelson Chimes M.D.   On: 03/15/2017 15:01   Dg Femur, Min 2 Views Right  Result Date: 03/15/2017 CLINICAL DATA:  ORIF femur fracture. EXAM: DG C-ARM 61-120 MIN; RIGHT FEMUR 2 VIEWS COMPARISON:  03/13/2017 FINDINGS: Multiple C-arm images show restoration of anatomic alignment at the femoral diaphyseal fracture site with lateral plate and screw fixation. Components appear well positioned. No radiographically detectable complication. Lateral view at the end  shows slight separation of the main fragments but reasonably close apposition. IMPRESSION: ORIF femoral diaphyseal fracture. Electronically Signed   By: Nelson Chimes M.D.   On: 03/15/2017 15:01   Dg Femur Port, Min 2 Views Right  Result Date: 03/15/2017 CLINICAL DATA:  Open reduction and internal fixation of distal femur fracture. EXAM: RIGHT FEMUR PORTABLE 2 VIEW COMPARISON:  Intraoperative films of earlier today. FINDINGS: Right hip arthroplasty. Plate and screw fixation of a spiral femoral shaft fractures. Prior total knee arthroplasty. No acute hardware complication. Improved alignment, with minimal medial and anterior displacement of the distal fractured fragment. IMPRESSION: Expected appearance after interval plate and screw fixation of mid femoral shaft fracture. Electronically Signed   By: Abigail Miyamoto M.D.   On: 03/15/2017 15:53      Scheduled Meds: . apixaban  5 mg Oral BID  . gabapentin  300 mg Oral TID  . hydrocortisone sod succinate (SOLU-CORTEF) inj  50 mg Intravenous Q12H  . insulin aspart  0-9 Units Subcutaneous TID WC  . linagliptin  5 mg Oral Daily  . metoprolol  tartrate  12.5 mg Oral BID  . pantoprazole  40 mg Oral Daily  . ursodiol  300 mg Oral BID   Continuous Infusions: . lactated ringers 10 mL/hr at 03/15/17 2157     LOS: 3 days    Time spent: Total of 15 minutes spent with pt, greater than 50% of which was spent in discussion of  treatment, counseling and coordination of care   Chipper Oman, MD Pager: Text Page via [email or web address]   If 7PM-7AM, please contact night-coverage [email or web address] 03/16/2017, 4:19 PM

## 2017-03-17 ENCOUNTER — Inpatient Hospital Stay (HOSPITAL_COMMUNITY): Payer: Medicare Other

## 2017-03-17 DIAGNOSIS — M9701XA Periprosthetic fracture around internal prosthetic right hip joint, initial encounter: Secondary | ICD-10-CM

## 2017-03-17 DIAGNOSIS — M7989 Other specified soft tissue disorders: Secondary | ICD-10-CM

## 2017-03-17 DIAGNOSIS — M79609 Pain in unspecified limb: Secondary | ICD-10-CM

## 2017-03-17 LAB — BASIC METABOLIC PANEL
ANION GAP: 8 (ref 5–15)
BUN: 21 mg/dL — ABNORMAL HIGH (ref 6–20)
CO2: 27 mmol/L (ref 22–32)
Calcium: 8.6 mg/dL — ABNORMAL LOW (ref 8.9–10.3)
Chloride: 99 mmol/L — ABNORMAL LOW (ref 101–111)
Creatinine, Ser: 1.07 mg/dL — ABNORMAL HIGH (ref 0.44–1.00)
GFR, EST AFRICAN AMERICAN: 51 mL/min — AB (ref >60)
GFR, EST NON AFRICAN AMERICAN: 44 mL/min — AB (ref >60)
Glucose, Bld: 273 mg/dL — ABNORMAL HIGH (ref 65–99)
Potassium: 4.7 mmol/L (ref 3.5–5.1)
Sodium: 134 mmol/L — ABNORMAL LOW (ref 135–145)

## 2017-03-17 LAB — CBC
HCT: 23.9 % — ABNORMAL LOW (ref 36.0–46.0)
HEMOGLOBIN: 7.8 g/dL — AB (ref 12.0–15.0)
MCH: 32 pg (ref 26.0–34.0)
MCHC: 32.6 g/dL (ref 30.0–36.0)
MCV: 98 fL (ref 78.0–100.0)
Platelets: 110 10*3/uL — ABNORMAL LOW (ref 150–400)
RBC: 2.44 MIL/uL — AB (ref 3.87–5.11)
RDW: 14.9 % (ref 11.5–15.5)
WBC: 8 10*3/uL (ref 4.0–10.5)

## 2017-03-17 LAB — GLUCOSE, CAPILLARY
GLUCOSE-CAPILLARY: 276 mg/dL — AB (ref 65–99)
GLUCOSE-CAPILLARY: 227 mg/dL — AB (ref 65–99)
GLUCOSE-CAPILLARY: 363 mg/dL — AB (ref 65–99)
Glucose-Capillary: 266 mg/dL — ABNORMAL HIGH (ref 65–99)

## 2017-03-17 MED ORDER — APIXABAN 5 MG PO TABS: 10.0000 mg | ORAL_TABLET | Freq: Two times a day (BID) | ORAL | Status: DC

## 2017-03-17 MED ORDER — APIXABAN 5 MG PO TABS
10.0000 mg | ORAL_TABLET | Freq: Two times a day (BID) | ORAL | Status: DC
  Administered 2017-03-17 – 2017-03-18 (×2): 10 mg via ORAL
  Filled 2017-03-17 (×2): qty 2

## 2017-03-17 MED ORDER — ENSURE ENLIVE PO LIQD
237.0000 mL | Freq: Two times a day (BID) | ORAL | Status: DC
  Administered 2017-03-17 – 2017-03-18 (×3): 237 mL via ORAL

## 2017-03-17 MED ORDER — GENERIC EXTERNAL MEDICATION
Status: DC
Start: ? — End: 2017-03-17

## 2017-03-17 MED ORDER — APIXABAN 5 MG PO TABS: 5.0000 mg | ORAL_TABLET | Freq: Two times a day (BID) | ORAL | Status: DC

## 2017-03-17 MED ORDER — PREDNISONE 20 MG PO TABS
20.0000 mg | ORAL_TABLET | Freq: Every day | ORAL | Status: DC
  Administered 2017-03-17 – 2017-03-18 (×2): 20 mg via ORAL
  Filled 2017-03-17 (×2): qty 1

## 2017-03-17 NOTE — Progress Notes (Signed)
*  Preliminary Results* Right lower extremity venous duplex completed. Right lower extremity is positive for acute deep vein thrombosis involving a single right peroneal vein. There is no evidence of right Baker's cyst.  Preliminary results discussed with Dr. Quincy Simmonds.  03/17/2017 1:17 PM  Maudry Mayhew, BS, RVT, RDCS, RDMS

## 2017-03-17 NOTE — Progress Notes (Signed)
PROGRESS NOTE [facility] [address] [name]   ION:[id number] DOB: [birth year]  DOA: 03/13/2017 PCP: Patient, No Pcp Per   Brief Narrative:  [name] is a 84 year old female with PMH of A. fib, chronic diastolic CHF, type II DM, PMR, CKD; admitted on 03/13/2017, presented with complaint of fall, was found to have femur fracture.  Admitted for surgical repair.  Patient underwent on ORIF of the right femur.  Subjective: Patient seen and examined, she is complaining of increase in R LE and calf pain. No other complaints.   Assessment & Plan: Right femoral fracture/mechanical fall Status post ORIF 03/15/17 Continue management per orthopedic PT recommending SNF   R Acute DVT - provoke - Femoral fracture and off a/c for 3 days  Will increase Eliquis to 10 mg BID for 1 week the continue 5 mg BID   A. fib Currently on A. fib but rate controlled Continue beta-blocker Per orthopedic may resume   Polymyalgia rheumatica On chronic steroids Decrease steroids to Prednisone 20 mg for 3 days, the can resume home dose   Chronic kidney disease stage III Creatinine remains at baseline   Anemia of chronic disease - superimposed acute blood loss - likely from surgery, hemodilution also contributing - d/c IVF  Hgb remains stable  Add iron supplementation  Continue to monitor  Transfuse if Hgb less than 7   Chronic diastolic CHF Stable no signs of fluid overload  Resume home Lasix in AM  D/c IVF   Type 2 diabetes mellitus Holding home oral hypoglycemic agent Patient on sliding scale CBGs stable Continue to monitor  DVT prophylaxis: SCDs Code Status: Full code Family Communication: Family at bedside Disposition Plan: Likely SNF when cleared by orthopedic surgery  Consultants:   Orthopedic surgery  Procedures:   ORIF 03/15/2017  Antimicrobials: Anti-infectives (From admission, onward)   Start     Dose/Rate Route Frequency Ordered Stop   03/15/17 2100  ceFAZolin (ANCEF)  IVPB 2g/100 mL premix     2 g 200 mL/hr over 30 Minutes Intravenous Every 12 hours 03/15/17 1618 03/16/17 1118   03/15/17 1436  vancomycin (VANCOCIN) powder  Status:  Discontinued       As needed 03/15/17 1437 03/15/17 1507   03/14/17 1600  ceFAZolin (ANCEF) powder 2 g  Status:  Discontinued     2 g Other To Surgery 03/14/17 1218 03/14/17 1241   03/14/17 1600  ceFAZolin (ANCEF) IVPB 2g/100 mL premix     2 g 200 mL/hr over 30 Minutes Intravenous  Once 03/14/17 1240 03/15/17 1300   03/14/17 1245  ceFAZolin (ANCEF) powder 1 g  Status:  Discontinued     1 g Other To Surgery 03/14/17 1235 03/14/17 1236      Objective: Vitals:   03/16/17 2209 03/17/17 0651 03/17/17 1030 03/17/17 1219  BP: 119/62 121/61 (!) 110/59 (!) 99/54  Pulse: 89 83 76 78  Resp: 16 16 16 16   Temp: 98 F (36.7 C) (!) 97.5 F (36.4 C)  98.3 F (36.8 C)  TempSrc: Oral Oral  Oral  SpO2: 98% 97%  98%  Weight:      Height:       No intake or output data in the 24 hours ending 03/17/17 1549 Filed Weights   03/14/17 1040 03/15/17 1215  Weight: 65.3 kg (144 lb) 65.3 kg (144 lb)    Examination:  General: Mild anxious Cardiovascular: RRR, S1/S2 +, no rubs, no gallops Respiratory: CTA bilaterally, no wheezing, no rhonchi Abdominal: Soft,  NT, ND, bowel sounds Extremities: Very tender calf on the R  Data Reviewed: I have personally reviewed following labs and imaging studies  CBC: Recent Labs  Lab 03/13/17 2010 03/14/17 1120 03/15/17 0702 03/16/17 1452 03/17/17 0611  WBC 10.0 11.7* 9.9 11.4* 8.0  NEUTROABS 7.2 7.5  --   --   --   HGB 10.9* 10.3* 9.1* 7.9* 7.8*  HCT 34.9* 32.6* 28.7* 25.5* 23.9*  MCV 97.2 96.7 96.3 97.3 98.0  PLT 125* 154 121* 149* 295*   Basic Metabolic Panel: Recent Labs  Lab 03/13/17 2010 03/14/17 1120 03/15/17 0702 03/16/17 0942 03/17/17 0611  NA 135 136 132* 133* 134*  K 4.8 4.2 4.9 4.6 4.7  CL 105 102 101 101 99*  CO2 22 21* 22 21* 27  GLUCOSE 135* 129* 230* 311* 273*    BUN 27* 29* 31* 25* 21*  CREATININE 1.19* 1.23* 1.35* 1.30* 1.07*  CALCIUM 9.1 9.0 8.6* 8.4* 8.6*  MG  --   --  1.7  --   --    GFR: Estimated Creatinine Clearance: 30.2 mL/min (A) (by C-G formula based on SCr of 1.07 mg/dL (H)). Liver Function Tests: Recent Labs  Lab 03/14/17 1120  AST 31  ALT 30  ALKPHOS 68  BILITOT 1.1  PROT 6.2*  ALBUMIN 3.3*   No results for input(s): LIPASE, AMYLASE in the last 168 hours. No results for input(s): AMMONIA in the last 168 hours. Coagulation Profile: Recent Labs  Lab 03/14/17 1120  INR 1.28   Cardiac Enzymes: No results for input(s): CKTOTAL, CKMB, CKMBINDEX, TROPONINI in the last 168 hours. BNP (last 3 results) No results for input(s): PROBNP in the last 8760 hours. HbA1C: No results for input(s): HGBA1C in the last 72 hours. CBG: Recent Labs  Lab 03/16/17 1233 03/16/17 1741 03/16/17 2212 03/17/17 0650 03/17/17 1217  GLUCAP 242* 304* 232* 276* 227*   Lipid Profile: No results for input(s): CHOL, HDL, LDLCALC, TRIG, CHOLHDL, LDLDIRECT in the last 72 hours. Thyroid Function Tests: No results for input(s): TSH, T4TOTAL, FREET4, T3FREE, THYROIDAB in the last 72 hours. Anemia Panel: No results for input(s): VITAMINB12, FOLATE, FERRITIN, TIBC, IRON, RETICCTPCT in the last 72 hours. Sepsis Labs: No results for input(s): PROCALCITON, LATICACIDVEN in the last 168 hours.  Recent Results (from the past 240 hour(s))  Surgical PCR screen     Status: None   Collection Time: 03/14/17  2:47 PM  Result Value Ref Range Status   MRSA, PCR NEGATIVE NEGATIVE Final   Staphylococcus aureus NEGATIVE NEGATIVE Final    Comment: (NOTE) The Xpert SA Assay (FDA approved for NASAL specimens in patients 75 years of age and older), is one component of a comprehensive surveillance program. It is not intended to diagnose infection nor to guide or monitor treatment.     Radiology Studies: Dg Femur, Min 2 Views Right  Result Date:  03/17/2017 CLINICAL DATA:  History of recent femoral fracture with fixation and persistent pain, subsequent encounter EXAM: RIGHT FEMUR 2 VIEWS COMPARISON:  03/15/2017 FINDINGS: There again noted changes consistent with right hip replacement as well as a fixation sideplate along the mid to distal right femur with multiple fixation screws. Right knee replacement is noted as well. The fracture fragments are similar in appearance to that seen on the prior exam. No new focal abnormality is noted. IMPRESSION: Stable appearance of right femoral fracture with fixation plate. Electronically Signed   By: Inez Catalina M.D.   On: 03/17/2017 10:20  Scheduled Meds: . apixaban  10 mg Oral BID  . feeding supplement (ENSURE ENLIVE)  237 mL Oral BID BM  . gabapentin  300 mg Oral TID  . insulin aspart  0-9 Units Subcutaneous TID WC  . linagliptin  5 mg Oral Daily  . metoprolol tartrate  12.5 mg Oral BID  . pantoprazole  40 mg Oral Daily  . predniSONE  20 mg Oral Q breakfast  . ursodiol  300 mg Oral BID   Continuous Infusions: . lactated ringers 10 mL/hr at 03/15/17 2157     LOS: 4 days    Time spent: Total of 15 minutes spent with pt, greater than 50% of which was spent in discussion of  treatment, counseling and coordination of care   Chipper Oman, MD Pager: Text Page via [email or web address]   If 7PM-7AM, please contact night-coverage [email or web address] 03/17/2017, 3:49 PM

## 2017-03-17 NOTE — Progress Notes (Addendum)
PT Cancellation Note  Patient Details Name: [name] MRN: [id number] DOB: 05-02-1924   Cancelled Treatment:    Reason Eval/Treat Not Completed: Medical issues which prohibited therapy Attempted to work with patient this afternoon, pt is + for acute DVT. PT will hold until addressed.   Reinaldo Berber, PT, DPT Acute Rehab Services Pager: [phone]    Reinaldo Berber 03/17/2017, 4:04 PM

## 2017-03-17 NOTE — Social Work (Signed)
CSW received Insurance Auth [id number], 1/12-1/14/19, RVB-550 therapy minutes weekly, [place]#[phone]/fax#[phone].  Pt will go to [facility] for short term rehab when medically ready.  CSW contacted admission staff at [facility] and provided authorization.  Pt can discharge over weekend if medically ready, however SNF does not take admissions on sundays.  CSW will continue to follow.  Elissa Hefty, LCSW Clinical Social Worker [phone]

## 2017-03-17 NOTE — Progress Notes (Signed)
Reviewed x-rays performed today and no changes in fixation. Will order doppler US of right lower extremity to rule out DVT.  Shona Needles, MD [facility] [phone] (phone)

## 2017-03-17 NOTE — Social Work (Addendum)
CSW received a retrun call from [employer] advising that they can offer SNF bed.  CSW faxing clinicals to [employer] to obtain [employer].  CSW called granddaughter and advised of same.  CSW will f/u.  Elissa Hefty, LCSW Clinical Social Worker [phone]

## 2017-03-17 NOTE — Progress Notes (Signed)
OT Cancellation Note  Patient Details Name: [name] MRN: [id number] DOB: 1924/04/27   Cancelled Treatment:    Reason Eval/Treat Not Completed: Medical issues which prohibited therapy(Pt + for DVT; no medical intervention started. Will hold).  Binnie Kand M.S., OTR/L Pager: [phone]  03/17/2017, 3:55 PM

## 2017-03-17 NOTE — Progress Notes (Signed)
Orthopaedic Trauma Progress Note  S: Patient have a significant amount of pain in knee, thigh and lower leg. Much more than to be expected postoperatively  O:  Vitals:   03/16/17 2209 03/17/17 0651  BP: 119/62 121/61  Pulse: 89 83  Resp: 16 16  Temp: 98 F (36.7 C) (!) 97.5 F (36.4 C)  SpO2: 98% 97%   Gen: Awake and alert, cooperative and pleasant RLE: Incisions clean, dry and intact. Compartments soft and compressible. Neurovascularly intact  Labs:  CBC    Component Value Date/Time   WBC 8.0 03/17/2017 0611   RBC 2.44 (L) 03/17/2017 0611   HGB 7.8 (L) 03/17/2017 0611   HGB 12.2 04/04/2006 1039   HCT 23.9 (L) 03/17/2017 0611   HCT 37.4 04/04/2006 1039   PLT PENDING 03/17/2017 0611   PLT 181 04/04/2006 1039   MCV 98.0 03/17/2017 0611   MCV 94.8 05/30/2012 1806   MCV 99.3 04/04/2006 1039   MCH 32.0 03/17/2017 0611   MCHC 32.6 03/17/2017 0611   RDW 14.9 03/17/2017 0611   RDW 16.7 (H) 01/15/2013 1603   RDW 12.2 04/04/2006 1039   LYMPHSABS 2.9 03/14/2017 1120   LYMPHSABS 2.3 01/15/2013 1603   LYMPHSABS 2.1 04/04/2006 1039   MONOABS 1.3 (H) 03/14/2017 1120   MONOABS 0.7 04/04/2006 1039   EOSABS 0.0 03/14/2017 1120   EOSABS 0.2 01/15/2013 1603   BASOSABS 0.0 03/14/2017 1120   BASOSABS 0.0 01/15/2013 1603   BASOSABS 0.1 04/04/2006 1039     A/P: 84 year old female with history of CHF, diabetes, A. fib on Eliquis with a right periprosthetic femoral shaft fracture.  Status post ORIF on 03/15/2017  -Will order repeat x-rays this AM to make sure fixation has not changed -If normal would recommend doppler US to rule out DVT as patient is having significant calf pain -Dressing to leg as needed -Continue eliquis -Dispo: SNF  Shona Needles, MD [facility] [phone] (phone)

## 2017-03-18 LAB — TYPE AND SCREEN
ABO/RH(D): A NEG
ANTIBODY SCREEN: NEGATIVE
UNIT DIVISION: 0
Unit division: 0

## 2017-03-18 LAB — CBC
HCT: 23.7 % — ABNORMAL LOW (ref 36.0–46.0)
Hemoglobin: 7.7 g/dL — ABNORMAL LOW (ref 12.0–15.0)
MCH: 31.6 pg (ref 26.0–34.0)
MCHC: 32.5 g/dL (ref 30.0–36.0)
MCV: 97.1 fL (ref 78.0–100.0)
Platelets: 125 10*3/uL — ABNORMAL LOW (ref 150–400)
RBC: 2.44 MIL/uL — ABNORMAL LOW (ref 3.87–5.11)
RDW: 14.6 % (ref 11.5–15.5)
WBC: 8.6 10*3/uL (ref 4.0–10.5)

## 2017-03-18 LAB — BPAM RBC
Blood Product Expiration Date: 201901182359
Blood Product Expiration Date: 201902012359
ISSUE DATE / TIME: 201901091314
ISSUE DATE / TIME: 201901091314
UNIT TYPE AND RH: 600
Unit Type and Rh: 600

## 2017-03-18 LAB — BASIC METABOLIC PANEL
Anion gap: 6 (ref 5–15)
BUN: 27 mg/dL — AB (ref 6–20)
CALCIUM: 8.3 mg/dL — AB (ref 8.9–10.3)
CO2: 28 mmol/L (ref 22–32)
CREATININE: 1.14 mg/dL — AB (ref 0.44–1.00)
Chloride: 98 mmol/L — ABNORMAL LOW (ref 101–111)
GFR calc Af Amer: 48 mL/min — ABNORMAL LOW (ref >60)
GFR, EST NON AFRICAN AMERICAN: 41 mL/min — AB (ref >60)
GLUCOSE: 255 mg/dL — AB (ref 65–99)
Potassium: 4.2 mmol/L (ref 3.5–5.1)
Sodium: 132 mmol/L — ABNORMAL LOW (ref 135–145)

## 2017-03-18 LAB — GLUCOSE, CAPILLARY
GLUCOSE-CAPILLARY: 244 mg/dL — AB (ref 65–99)
GLUCOSE-CAPILLARY: 217 mg/dL — AB (ref 65–99)

## 2017-03-18 MED ORDER — PREDNISONE 20 MG PO TABS: 20.0000 mg | ORAL_TABLET | Freq: Every day | ORAL | 0 refills | Status: AC

## 2017-03-18 MED ORDER — FERROUS SULFATE 325 (65 FE) MG PO TABS
325.0000 mg | ORAL_TABLET | Freq: Two times a day (BID) | ORAL | Status: DC
  Administered 2017-03-18: 325 mg via ORAL
  Filled 2017-03-18: qty 1

## 2017-03-18 MED ORDER — APIXABAN 5 MG PO TABS: 5.0000 mg | ORAL_TABLET | Freq: Two times a day (BID) | ORAL | Status: DC

## 2017-03-18 MED ORDER — METOPROLOL TARTRATE 25 MG PO TABS: 12.5000 mg | ORAL_TABLET | Freq: Two times a day (BID) | ORAL | 1 refills | Status: DC

## 2017-03-18 MED ORDER — FERROUS SULFATE 325 (65 FE) MG PO TABS: 325.0000 mg | ORAL_TABLET | Freq: Two times a day (BID) | ORAL | 3 refills | Status: AC

## 2017-03-18 MED ORDER — PREDNISONE 1 MG PO TABS: 3.0000 mg | ORAL_TABLET | Freq: Every day | ORAL | 1 refills | Status: DC

## 2017-03-18 MED ORDER — OXYCODONE-ACETAMINOPHEN 5-325 MG PO TABS
2.0000 | ORAL_TABLET | ORAL | Status: DC | PRN
  Administered 2017-03-18: 2 via ORAL
  Filled 2017-03-18: qty 2

## 2017-03-18 MED ORDER — OXYCODONE-ACETAMINOPHEN 5-325 MG PO TABS: 2.0000 | ORAL_TABLET | ORAL | 0 refills | Status: DC | PRN

## 2017-03-18 MED ORDER — APIXABAN 5 MG PO TABS: 10.0000 mg | ORAL_TABLET | Freq: Two times a day (BID) | ORAL | Status: DC

## 2017-03-18 NOTE — Plan of Care (Signed)
  Nutrition: Adequate nutrition will be maintained 03/18/2017 1104 - Progressing by Williams Che, RN   Elimination: Will not experience complications related to bowel motility 03/18/2017 1104 - Progressing by Williams Che, RN   Pain Managment: General experience of comfort will improve 03/18/2017 1104 - Progressing by Williams Che, RN   Safety: Ability to remain free from injury will improve 03/18/2017 1104 - Progressing by Williams Che, RN

## 2017-03-18 NOTE — Clinical Social Work Placement (Signed)
   CLINICAL SOCIAL WORK PLACEMENT  NOTE  Date:  03/18/2017  Patient Details  Name: [name] MRN: [id number] Date of Birth: 23-Jan-1925  Clinical Social Work is seeking post-discharge placement for this patient at the [place] level of care (*CSW will initial, date and re-position this form in  chart as items are completed):  Yes   Patient/family provided with [place] Work Department's list of facilities offering this level of care within the geographic area requested by the patient (or if unable, by the patient's family).  Yes   Patient/family informed of their freedom to choose among providers that offer the needed level of care, that participate in Medicare, Medicaid or managed care program needed by the patient, have an available bed and are willing to accept the patient.  Yes   Patient/family informed of [facility]'s ownership interest in [facility] and [facility], as well as of the fact that they are under no obligation to receive care at these facilities.  PASRR submitted to EDS on       PASRR number received on       Existing PASRR number confirmed on 03/16/17     FL2 transmitted to all facilities in geographic area requested by pt/family on       FL2 transmitted to all facilities within larger geographic area on 03/16/17     Patient informed that his/her managed care company has contracts with or will negotiate with certain facilities, including the following:        Yes   Patient/family informed of bed offers received.  Patient chooses bed at [facility]     Physician recommends and patient chooses bed at      Patient to be transferred to [facility] on 03/18/17.  Patient to be transferred to facility by [facility]     Patient family notified on 03/18/17 of transfer.  Name of family member notified:  granddaughter contacted     PHYSICIAN       Additional Comment:     _______________________________________________ Eileen Stanford, LCSW 03/18/2017, 10:48 AM

## 2017-03-18 NOTE — Progress Notes (Signed)
Removed IV, all questions and concerns addressed, Pt not in distress. Called facility, gave report to nurse Joelene Millin, discharged with belongings via [place].

## 2017-03-18 NOTE — Discharge Summary (Signed)
Physician Discharge Summary  [name]  VEL:[id number]  DOB: 02-12-1925  DOA: 03/13/2017 PCP: Patient, No Pcp Per  Admit date: 03/13/2017 Discharge date: 03/18/2017  Admitted From: Home  Disposition:  SNF   Recommendations for Outpatient Follow-up:  1. Follow up with SNF provider at earliest convenience  2. Please obtain BMP/CBC in one week to monitor Hgb and Cr 3. PT as tolerated 4. Eliquis 10 mg BID for 7 days, then switch to 5 mg BID  5. Prednisone 20 mg for 2 more day then resume home dose 3 mg daily  6. Follow up with Dr Doreatha Martin (orthopedic surgery in 2 weeks)  7. Repeat Doppler US in 6 months   Discharge Condition: Stable   CODE STATUS: Full code  Diet recommendation: Heart Healthy   Brief/Interim Summary: For full details see H&P/ Progress note but in brief, [name] is a 84 year old female with PMH ofA. fib, chronic diastolic CHF, type II DM, PMR, CKD; admitted on1/09/2017, presented with complaint offall, was found to havefemur fracture.  Admitted for surgical repair.  Patient underwent on ORIF of the right femur. Post op complication with acute R LE DVT, Eliquis was increased. Patient participated with PT and recommended SNF. Patient stable for discharge.   Subjective: Patient seen and examined, she c/o R LE pain, diffuse. No other concerns. No acute events overnight.   Discharge Diagnoses/Hospital Course:  Right femoral fracture/mechanical fall Status post ORIF 03/15/17 SNF for STR  Pain meds as tolerated    R Acute DVT - provoke - Femoral fracture and off a/c for 3 days  Eliquis to 10 mg BID for 1 week then continue 5 mg BID   A. fib Currently on A. fib but rate controlled Continue beta-blocker On Eliquis   Polymyalgia rheumatica On chronic steroids Treated with stress dose steroids  Continue prednisone 20 mg for 2 more days, then resume home dose 3 mg daily   Chronic kidney disease stage III Creatinine remains at baseline  Check Cr in 1 week    Anemia of chronic disease - superimposed acute blood loss - likely from surgery, hemodilution also contributing.  Hgb remains stable upon discharge 7.7, no need for transfusion at this time patient asymptomatic  Iron supplementation Check CBC in 1 week   Chronic diastolic CHF Stable no signs of fluid overload  Continue home medication with no changes   Type 2 diabetes mellitus CBG's slight elevate likely from high dose steroids  Resume home oral medications  Monitor CBG's it should improve as steroids are decreased   All other chronic medical condition were stable during the hospitalization.  Patient was seen by physical therapy, recommending SNF  On the day of the discharge the patient's vitals were stable, and no other acute medical condition were reported by patient. the patient was felt safe to be discharge to SNF   Discharge Instructions  You were cared for by a hospitalist during your hospital stay. If you have any questions about your discharge medications or the care you received while you were in the hospital after you are discharged, you can call the unit and asked to speak with the hospitalist on call if the hospitalist that took care of you is not available. Once you are discharged, your primary care physician will handle any further medical issues. Please note that NO REFILLS for any discharge medications will be authorized once you are discharged, as it is imperative that you return to your primary care physician (or establish a  relationship with a primary care physician if you do not have one) for your aftercare needs so that they can reassess your need for medications and monitor your lab values.  Discharge Instructions    Call MD for:  difficulty breathing, headache or visual disturbances   Complete by:  As directed    Call MD for:  extreme fatigue   Complete by:  As directed    Call MD for:  hives   Complete by:  As directed    Call MD for:  persistant dizziness  or light-headedness   Complete by:  As directed    Call MD for:  persistant nausea and vomiting   Complete by:  As directed    Call MD for:  redness, tenderness, or signs of infection (pain, swelling, redness, odor or green/yellow discharge around incision site)   Complete by:  As directed    Call MD for:  severe uncontrolled pain   Complete by:  As directed    Call MD for:  temperature >100.4   Complete by:  As directed    Diet - low sodium heart healthy   Complete by:  As directed    Increase activity slowly   Complete by:  As directed      Allergies as of 03/18/2017      Reactions   Allegra [fexofenadine Hcl]    Unknown reaction   Levaquin [levofloxacin In D5w] Other (See Comments)   Unknown reaction   Tussionex Pennkinetic Er [hydrocod Polst-cpm Polst Er] Nausea And Vomiting   Codeine Nausea And Vomiting   Morphine And Related Nausea And Vomiting      Medication List    STOP taking these medications   ciprofloxacin 500 MG tablet Commonly known as:  CIPRO   meloxicam 15 MG tablet Commonly known as:  MOBIC   metroNIDAZOLE 500 MG tablet Commonly known as:  FLAGYL     TAKE these medications   apixaban 5 MG Tabs tablet Commonly known as:  ELIQUIS Take 2 tablets (10 mg total) by mouth 2 (two) times daily for 6 days. What changed:  how much to take   apixaban 5 MG Tabs tablet Commonly known as:  ELIQUIS Take 1 tablet (5 mg total) by mouth 2 (two) times daily. Start taking on:  03/25/2017 What changed:  You were already taking a medication with the same name, and this prescription was added. Make sure you understand how and when to take each.   feeding supplement (GLUCERNA SHAKE) Liqd Take 237 mLs by mouth 2 (two) times daily between meals.   ferrous sulfate 325 (65 FE) MG tablet Take 1 tablet (325 mg total) by mouth 2 (two) times daily with a meal.   gabapentin 300 MG capsule Commonly known as:  NEURONTIN Take 1 capsule (300 mg total) by mouth 3 (three) times  daily. Taking 1 tab in the am , and 2 tabs in the pm.   glimepiride 2 MG tablet Commonly known as:  AMARYL Take 2 mg by mouth daily with breakfast.   linagliptin 5 MG Tabs tablet Commonly known as:  TRADJENTA Take 1 tablet (5 mg total) by mouth daily.   metoprolol tartrate 25 MG tablet Commonly known as:  LOPRESSOR Take 0.5 tablets (12.5 mg total) by mouth 2 (two) times daily. What changed:    when to take this  additional instructions   omeprazole 20 MG capsule Commonly known as:  PRILOSEC Take 1 capsule (20 mg total) by mouth daily.   oxybutynin  5 MG tablet Commonly known as:  DITROPAN Take 1 tablet (5 mg total) by mouth 3 (three) times daily as needed for bladder spasms (Up to 3 times daily to control bladder spasms.).   oxyCODONE-acetaminophen 5-325 MG tablet Commonly known as:  PERCOCET/ROXICET Take 2 tablets by mouth every 4 (four) hours as needed for moderate pain.   predniSONE 20 MG tablet Commonly known as:  DELTASONE Take 1 tablet (20 mg total) by mouth daily with breakfast for 1 day. Start taking on:  03/19/2017 What changed:  You were already taking a medication with the same name, and this prescription was added. Make sure you understand how and when to take each.   predniSONE 1 MG tablet Commonly known as:  DELTASONE Take 3 tablets (3 mg total) by mouth daily with breakfast. Start taking on:  03/20/2017 What changed:    See the new instructions.  These instructions start on 03/20/2017. If you are unsure what to do until then, ask your doctor or other care provider.   ursodiol 300 MG capsule Commonly known as:  ACTIGALL Take 150 mg by mouth 2 (two) times daily.       Contact information for follow-up providers    Haddix, Thomasene Lot, MD. Schedule an appointment as soon as possible for a visit in 2 week(s).   Specialty:  Orthopedic Surgery Why:  Follow up post op  Contact information: [place] [address] [phone]             Contact information for after-discharge care    Destination    [facility] Follow [facility].   Service:  Skilled Nursing Contact information: [address] [place] [phone]                 Allergies  Allergen Reactions  . Allegra [Fexofenadine Hcl]     Unknown reaction  . Levaquin [Levofloxacin In D5w] Other (See Comments)    Unknown reaction  . Tussionex Pennkinetic Er [Hydrocod Polst-Cpm Polst Er] Nausea And Vomiting  . Codeine Nausea And Vomiting  . Morphine And Related Nausea And Vomiting    Consultations:  Orthopedic surgery - Dr Doreatha Martin    Procedures/Studies: Dg Knee 1-2 Views Right  Result Date: 03/13/2017 CLINICAL DATA:  Fall with right leg deformity.  Initial encounter. EXAM: RIGHT KNEE - 1-2 VIEW COMPARISON:  None. FINDINGS: Comminuted fracture of the right femoral shaft extending from the midportion to the supracondylar femur. On the AP view there is extension to the level of a total knee arthroplasty that is nondisplaced. IMPRESSION: Displaced fracture of the right femoral shaft that extends to the knee arthroplasty on AP view. The arthroplasty hardware is located. Electronically Signed   By: Monte Fantasia M.D.   On: 03/13/2017 19:48   Ct Head Wo Contrast  Result Date: 03/13/2017 CLINICAL DATA:  Tripped and fell.  Hit head. EXAM: CT HEAD WITHOUT CONTRAST TECHNIQUE: Contiguous axial images were obtained from the base of the skull through the vertex without intravenous contrast. COMPARISON:  09/11/2016 FINDINGS: Brain: Stable age related cerebral atrophy, ventriculomegaly and periventricular white matter disease. No extra-axial fluid collections are identified. No CT findings for acute hemispheric infarction or intracranial hemorrhage. No mass lesions. The brainstem and cerebellum are normal. Vascular: Stable vascular calcifications. No hyperdense vessels or aneurysm. Skull: No acute skull or facial bone fractures.  Sinuses/Orbits: Chronic left maxillary sinus disease with high attenuation material filling the sinus. This is likely chronic  inspissated debris. The mastoid air cells and middle ear cavities are clear. The globes are intact. Other: No scalp lesions or hematoma. IMPRESSION: No acute intracranial findings or skull fracture. Chronic left maxillary sinus disease. Electronically Signed   By: Marijo Sanes M.D.   On: 03/13/2017 19:30   Dg Chest Port 1 View  Result Date: 03/14/2017 CLINICAL DATA:  Preoperative examination prior to right hip surgery EXAM: PORTABLE CHEST 1 VIEW COMPARISON:  PA and lateral chest x-ray of February 10, 2017 FINDINGS: The lungs are well-expanded. There is no focal infiltrate. There is no pleural effusion. The heart and pulmonary vascularity are normal. There calcification in the wall of the thoracic aorta. There is no pleural effusion. The bony thorax exhibits no acute abnormality. IMPRESSION: There is no active cardiopulmonary disease. Thoracic aortic atherosclerosis. Electronically Signed   By: David  Martinique M.D.   On: 03/14/2017 07:28   Dg Knee Left Port  Result Date: 03/14/2017 CLINICAL DATA:  Left knee pain. History of prior total left knee arthroplasty. EXAM: PORTABLE LEFT KNEE - 1-2 VIEW COMPARISON:  08/08/2007 FINDINGS: The femoral and tibial components are well seated. No complicating features are identified. No joint effusion. No fracture. Moderate vascular calcifications. IMPRESSION: The total left knee arthroplasty components are intact without complicating features. No acute bony findings. Electronically Signed   By: Marijo Sanes M.D.   On: 03/14/2017 12:36   Dg C-arm 1-60 Min  Result Date: 03/15/2017 CLINICAL DATA:  ORIF femur fracture. EXAM: DG C-ARM 61-120 MIN; RIGHT FEMUR 2 VIEWS COMPARISON:  03/13/2017 FINDINGS: Multiple C-arm images show restoration of anatomic alignment at the femoral diaphyseal fracture site with lateral plate and screw fixation. Components appear  well positioned. No radiographically detectable complication. Lateral view at the end shows slight separation of the main fragments but reasonably close apposition. IMPRESSION: ORIF femoral diaphyseal fracture. Electronically Signed   By: Nelson Chimes M.D.   On: 03/15/2017 15:01   Dg Femur, Min 2 Views Right  Result Date: 03/17/2017 CLINICAL DATA:  History of recent femoral fracture with fixation and persistent pain, subsequent encounter EXAM: RIGHT FEMUR 2 VIEWS COMPARISON:  03/15/2017 FINDINGS: There again noted changes consistent with right hip replacement as well as a fixation sideplate along the mid to distal right femur with multiple fixation screws. Right knee replacement is noted as well. The fracture fragments are similar in appearance to that seen on the prior exam. No new focal abnormality is noted. IMPRESSION: Stable appearance of right femoral fracture with fixation plate. Electronically Signed   By: Inez Catalina M.D.   On: 03/17/2017 10:20   Dg Femur, Min 2 Views Right  Result Date: 03/15/2017 CLINICAL DATA:  ORIF femur fracture. EXAM: DG C-ARM 61-120 MIN; RIGHT FEMUR 2 VIEWS COMPARISON:  03/13/2017 FINDINGS: Multiple C-arm images show restoration of anatomic alignment at the femoral diaphyseal fracture site with lateral plate and screw fixation. Components appear well positioned. No radiographically detectable complication. Lateral view at the end shows slight separation of the main fragments but reasonably close apposition. IMPRESSION: ORIF femoral diaphyseal fracture. Electronically Signed   By: Nelson Chimes M.D.   On: 03/15/2017 15:01   Dg Femur Min 2 Views Right  Result Date: 03/13/2017 CLINICAL DATA:  Right knee deformity due to fall. Initial encounter. EXAM: RIGHT FEMUR 2 VIEWS COMPARISON:  None. FINDINGS: Comminuted fracture of the right femoral shaft extending from the midportion to the supracondylar femur. On the AP view there is extension to the level of a total knee arthroplasty  that is nondisplaced. Right hip hemiarthroplasty with lucent appearance around the femoral stem. Located knee and hip joint. IMPRESSION: 1. Displaced fracture of the right femoral shaft reaching the located knee arthroplasty. 2. Right hip hemiarthroplasty with lucency around the femoral stem. Electronically Signed   By: Monte Fantasia M.D.   On: 03/13/2017 19:47   Dg Femur Port, Min 2 Views Right  Result Date: 03/15/2017 CLINICAL DATA:  Open reduction and internal fixation of distal femur fracture. EXAM: RIGHT FEMUR PORTABLE 2 VIEW COMPARISON:  Intraoperative films of earlier today. FINDINGS: Right hip arthroplasty. Plate and screw fixation of a spiral femoral shaft fractures. Prior total knee arthroplasty. No acute hardware complication. Improved alignment, with minimal medial and anterior displacement of the distal fractured fragment. IMPRESSION: Expected appearance after interval plate and screw fixation of mid femoral shaft fracture. Electronically Signed   By: Abigail Miyamoto M.D.   On: 03/15/2017 15:53    Discharge Exam: Vitals:   03/18/17 0534 03/18/17 0805  BP: (!) 111/55 128/74  Pulse: 77 85  Resp:  16  Temp: 97.6 F (36.4 C) 97.9 F (36.6 C)  SpO2: 97% 99%   Vitals:   03/17/17 1219 03/17/17 2042 03/18/17 0534 03/18/17 0805  BP: (!) 99/54 100/60 (!) 111/55 128/74  Pulse: 78 90 77 85  Resp: 16   16  Temp: 98.3 F (36.8 C) 97.8 F (36.6 C) 97.6 F (36.4 C) 97.9 F (36.6 C)  TempSrc: Oral Oral Oral Oral  SpO2: 98% 94% 97% 99%  Weight:      Height:        General: Pt is alert, awake, not in acute distress Cardiovascular: RRR, S1/S2 +, no rubs, no gallops Respiratory: CTA bilaterally, no wheezing, no rhonchi Abdominal: Soft, NT, ND, bowel sounds + Extremities: R leg incision nice and clean, R calf tender to palpation    The results of significant diagnostics from this hospitalization (including imaging, microbiology, ancillary and laboratory) are listed below for reference.      Microbiology: Recent Results (from the past 240 hour(s))  Surgical PCR screen     Status: None   Collection Time: 03/14/17  2:47 PM  Result Value Ref Range Status   MRSA, PCR NEGATIVE NEGATIVE Final   Staphylococcus aureus NEGATIVE NEGATIVE Final    Comment: (NOTE) The Xpert SA Assay (FDA approved for NASAL specimens in patients 40 years of age and older), is one component of a comprehensive surveillance program. It is not intended to diagnose infection nor to guide or monitor treatment.      Labs: BNP (last 3 results) Recent Labs    10/30/16 2002 02/10/17 1052  BNP 395.0* 673.4*   Basic Metabolic Panel: Recent Labs  Lab 03/14/17 1120 03/15/17 0702 03/16/17 0942 03/17/17 0611 03/18/17 0539  NA 136 132* 133* 134* 132*  K 4.2 4.9 4.6 4.7 4.2  CL 102 101 101 99* 98*  CO2 21* 22 21* 27 28  GLUCOSE 129* 230* 311* 273* 255*  BUN 29* 31* 25* 21* 27*  CREATININE 1.23* 1.35* 1.30* 1.07* 1.14*  CALCIUM 9.0 8.6* 8.4* 8.6* 8.3*  MG  --  1.7  --   --   --    Liver Function Tests: Recent Labs  Lab 03/14/17 1120  AST 31  ALT 30  ALKPHOS 68  BILITOT 1.1  PROT 6.2*  ALBUMIN 3.3*   No results for input(s): LIPASE, AMYLASE in the last 168 hours. No results for input(s): AMMONIA in the last 168 hours. CBC: Recent  Labs  Lab 03/13/17 2010 03/14/17 1120 03/15/17 0702 03/16/17 1452 03/17/17 0611 03/18/17 0539  WBC 10.0 11.7* 9.9 11.4* 8.0 8.6  NEUTROABS 7.2 7.5  --   --   --   --   HGB 10.9* 10.3* 9.1* 7.9* 7.8* 7.7*  HCT 34.9* 32.6* 28.7* 25.5* 23.9* 23.7*  MCV 97.2 96.7 96.3 97.3 98.0 97.1  PLT 125* 154 121* 149* 110* 125*   Cardiac Enzymes: No results for input(s): CKTOTAL, CKMB, CKMBINDEX, TROPONINI in the last 168 hours. BNP: Invalid input(s): POCBNP CBG: Recent Labs  Lab 03/17/17 0650 03/17/17 1217 03/17/17 1631 03/17/17 2046 03/18/17 0639  GLUCAP 276* 227* 266* 363* 244*   D-Dimer No results for input(s): DDIMER in the last 72 hours. Hgb  A1c No results for input(s): HGBA1C in the last 72 hours. Lipid Profile No results for input(s): CHOL, HDL, LDLCALC, TRIG, CHOLHDL, LDLDIRECT in the last 72 hours. Thyroid function studies No results for input(s): TSH, T4TOTAL, T3FREE, THYROIDAB in the last 72 hours.  Invalid input(s): FREET3 Anemia work up No results for input(s): VITAMINB12, FOLATE, FERRITIN, TIBC, IRON, RETICCTPCT in the last 72 hours. Urinalysis    Component Value Date/Time   COLORURINE YELLOW 03/14/2017 1024   APPEARANCEUR CLEAR 03/14/2017 1024   APPEARANCEUR Clear 05/07/2013 1436   LABSPEC 1.019 03/14/2017 1024   PHURINE 5.0 03/14/2017 1024   GLUCOSEU 50 (A) 03/14/2017 1024   HGBUR SMALL (A) 03/14/2017 1024   BILIRUBINUR NEGATIVE 03/14/2017 1024   BILIRUBINUR Neg 12/11/2013 1345   BILIRUBINUR Negative 05/07/2013 1436   KETONESUR 20 (A) 03/14/2017 1024   PROTEINUR NEGATIVE 03/14/2017 1024   UROBILINOGEN 0.2 12/11/2013 1345   UROBILINOGEN 1.0 11/16/2013 1256   NITRITE NEGATIVE 03/14/2017 1024   LEUKOCYTESUR TRACE (A) 03/14/2017 1024   LEUKOCYTESUR Negative 05/07/2013 1436   Sepsis Labs Invalid input(s): PROCALCITONIN,  WBC,  LACTICIDVEN Microbiology Recent Results (from the past 240 hour(s))  Surgical PCR screen     Status: None   Collection Time: 03/14/17  2:47 PM  Result Value Ref Range Status   MRSA, PCR NEGATIVE NEGATIVE Final   Staphylococcus aureus NEGATIVE NEGATIVE Final    Comment: (NOTE) The Xpert SA Assay (FDA approved for NASAL specimens in patients 38 years of age and older), is one component of a comprehensive surveillance program. It is not intended to diagnose infection nor to guide or monitor treatment.      Time coordinating discharge: 32 minutes  SIGNED:  Chipper Oman, MD  [facility] 03/18/2017, 11:12 AM  Pager please text page via  [email or web address]

## 2017-03-18 NOTE — Clinical Social Work Note (Signed)
[facility] can take pt today. Insurance approval received. Facility does not take admissions on Sunday. CSW following for d/c.   [place], [place]

## 2017-03-31 ENCOUNTER — Encounter (HOSPITAL_COMMUNITY): Payer: Self-pay | Admitting: Emergency Medicine

## 2017-03-31 ENCOUNTER — Inpatient Hospital Stay (HOSPITAL_COMMUNITY)
Admission: EM | Admit: 2017-03-31 | Discharge: 2017-04-06 | DRG: 292 | Disposition: A | Discharge status: Skilled Nursing Facility | Payer: Medicare Other | Attending: Internal Medicine | Admitting: Internal Medicine

## 2017-03-31 ENCOUNTER — Emergency Department (HOSPITAL_COMMUNITY): Payer: Medicare Other

## 2017-03-31 DIAGNOSIS — D649 Anemia, unspecified: Secondary | ICD-10-CM

## 2017-03-31 DIAGNOSIS — E1122 Type 2 diabetes mellitus with diabetic chronic kidney disease: Secondary | ICD-10-CM | POA: Diagnosis present

## 2017-03-31 DIAGNOSIS — D509 Iron deficiency anemia, unspecified: Secondary | ICD-10-CM | POA: Diagnosis present

## 2017-03-31 DIAGNOSIS — E1165 Type 2 diabetes mellitus with hyperglycemia: Secondary | ICD-10-CM

## 2017-03-31 DIAGNOSIS — R579 Shock, unspecified: Principal | ICD-10-CM | POA: Diagnosis present

## 2017-03-31 DIAGNOSIS — M79605 Pain in left leg: Secondary | ICD-10-CM | POA: Diagnosis present

## 2017-03-31 DIAGNOSIS — I480 Paroxysmal atrial fibrillation: Secondary | ICD-10-CM | POA: Diagnosis present

## 2017-03-31 DIAGNOSIS — W19XXXA Unspecified fall, initial encounter: Secondary | ICD-10-CM

## 2017-03-31 DIAGNOSIS — N390 Urinary tract infection, site not specified: Secondary | ICD-10-CM | POA: Diagnosis present

## 2017-03-31 DIAGNOSIS — M25562 Pain in left knee: Secondary | ICD-10-CM | POA: Diagnosis present

## 2017-03-31 DIAGNOSIS — D638 Anemia in other chronic diseases classified elsewhere: Secondary | ICD-10-CM | POA: Diagnosis present

## 2017-03-31 DIAGNOSIS — M79606 Pain in leg, unspecified: Secondary | ICD-10-CM | POA: Diagnosis present

## 2017-03-31 DIAGNOSIS — Z96642 Presence of left artificial hip joint: Secondary | ICD-10-CM | POA: Diagnosis present

## 2017-03-31 DIAGNOSIS — F329 Major depressive disorder, single episode, unspecified: Secondary | ICD-10-CM | POA: Diagnosis present

## 2017-03-31 DIAGNOSIS — Z885 Allergy status to narcotic agent status: Secondary | ICD-10-CM

## 2017-03-31 DIAGNOSIS — Z96653 Presence of artificial knee joint, bilateral: Secondary | ICD-10-CM | POA: Diagnosis present

## 2017-03-31 DIAGNOSIS — M810 Age-related osteoporosis without current pathological fracture: Secondary | ICD-10-CM | POA: Diagnosis present

## 2017-03-31 DIAGNOSIS — Z8553 Personal history of malignant neoplasm of renal pelvis: Secondary | ICD-10-CM

## 2017-03-31 DIAGNOSIS — N183 Chronic kidney disease, stage 3 unspecified: Secondary | ICD-10-CM | POA: Diagnosis present

## 2017-03-31 DIAGNOSIS — H3530 Unspecified macular degeneration: Secondary | ICD-10-CM | POA: Diagnosis present

## 2017-03-31 DIAGNOSIS — Z7901 Long term (current) use of anticoagulants: Secondary | ICD-10-CM

## 2017-03-31 DIAGNOSIS — Z6827 Body mass index (BMI) 27.0-27.9, adult: Secondary | ICD-10-CM

## 2017-03-31 DIAGNOSIS — E11319 Type 2 diabetes mellitus with unspecified diabetic retinopathy without macular edema: Secondary | ICD-10-CM | POA: Diagnosis present

## 2017-03-31 DIAGNOSIS — Z87891 Personal history of nicotine dependence: Secondary | ICD-10-CM

## 2017-03-31 DIAGNOSIS — S79101D Unspecified physeal fracture of lower end of right femur, subsequent encounter for fracture with routine healing: Secondary | ICD-10-CM

## 2017-03-31 DIAGNOSIS — I4891 Unspecified atrial fibrillation: Secondary | ICD-10-CM | POA: Diagnosis present

## 2017-03-31 DIAGNOSIS — Z8673 Personal history of transient ischemic attack (TIA), and cerebral infarction without residual deficits: Secondary | ICD-10-CM

## 2017-03-31 DIAGNOSIS — D539 Nutritional anemia, unspecified: Secondary | ICD-10-CM | POA: Diagnosis present

## 2017-03-31 DIAGNOSIS — Z7984 Long term (current) use of oral hypoglycemic drugs: Secondary | ICD-10-CM

## 2017-03-31 DIAGNOSIS — M9701XD Periprosthetic fracture around internal prosthetic right hip joint, subsequent encounter: Secondary | ICD-10-CM

## 2017-03-31 DIAGNOSIS — F419 Anxiety disorder, unspecified: Secondary | ICD-10-CM | POA: Diagnosis present

## 2017-03-31 DIAGNOSIS — W1830XA Fall on same level, unspecified, initial encounter: Secondary | ICD-10-CM | POA: Diagnosis present

## 2017-03-31 DIAGNOSIS — E1142 Type 2 diabetes mellitus with diabetic polyneuropathy: Secondary | ICD-10-CM | POA: Diagnosis present

## 2017-03-31 DIAGNOSIS — Z7952 Long term (current) use of systemic steroids: Secondary | ICD-10-CM

## 2017-03-31 DIAGNOSIS — E860 Dehydration: Secondary | ICD-10-CM | POA: Diagnosis present

## 2017-03-31 DIAGNOSIS — I509 Heart failure, unspecified: Secondary | ICD-10-CM | POA: Diagnosis present

## 2017-03-31 DIAGNOSIS — I130 Hypertensive heart and chronic kidney disease with heart failure and stage 1 through stage 4 chronic kidney disease, or unspecified chronic kidney disease: Secondary | ICD-10-CM | POA: Diagnosis present

## 2017-03-31 DIAGNOSIS — T380X5A Adverse effect of glucocorticoids and synthetic analogues, initial encounter: Secondary | ICD-10-CM | POA: Diagnosis present

## 2017-03-31 DIAGNOSIS — Z881 Allergy status to other antibiotic agents status: Secondary | ICD-10-CM

## 2017-03-31 DIAGNOSIS — M25551 Pain in right hip: Secondary | ICD-10-CM | POA: Diagnosis present

## 2017-03-31 DIAGNOSIS — M315 Giant cell arteritis with polymyalgia rheumatica: Secondary | ICD-10-CM | POA: Diagnosis present

## 2017-03-31 DIAGNOSIS — Z888 Allergy status to other drugs, medicaments and biological substances status: Secondary | ICD-10-CM

## 2017-03-31 DIAGNOSIS — Z79899 Other long term (current) drug therapy: Secondary | ICD-10-CM

## 2017-03-31 DIAGNOSIS — Z66 Do not resuscitate: Secondary | ICD-10-CM | POA: Diagnosis present

## 2017-03-31 DIAGNOSIS — Y92129 Unspecified place in nursing home as the place of occurrence of the external cause: Secondary | ICD-10-CM

## 2017-03-31 DIAGNOSIS — J449 Chronic obstructive pulmonary disease, unspecified: Secondary | ICD-10-CM | POA: Diagnosis present

## 2017-03-31 DIAGNOSIS — D696 Thrombocytopenia, unspecified: Secondary | ICD-10-CM | POA: Diagnosis present

## 2017-03-31 DIAGNOSIS — L8990 Pressure ulcer of unspecified site, unspecified stage: Secondary | ICD-10-CM

## 2017-03-31 DIAGNOSIS — E440 Moderate protein-calorie malnutrition: Secondary | ICD-10-CM

## 2017-03-31 DIAGNOSIS — E785 Hyperlipidemia, unspecified: Secondary | ICD-10-CM | POA: Diagnosis present

## 2017-03-31 DIAGNOSIS — M25561 Pain in right knee: Secondary | ICD-10-CM | POA: Diagnosis present

## 2017-03-31 LAB — CBC WITH DIFFERENTIAL/PLATELET
BASOS PCT: 0 %
Basophils Absolute: 0 10*3/uL (ref 0.0–0.1)
EOS ABS: 0.1 10*3/uL (ref 0.0–0.7)
Eosinophils Relative: 1 %
HCT: 31.7 % — ABNORMAL LOW (ref 36.0–46.0)
Hemoglobin: 10 g/dL — ABNORMAL LOW (ref 12.0–15.0)
Lymphocytes Relative: 21 %
Lymphs Abs: 2.3 10*3/uL (ref 0.7–4.0)
MCH: 32.3 pg (ref 26.0–34.0)
MCHC: 31.5 g/dL (ref 30.0–36.0)
MCV: 102.3 fL — ABNORMAL HIGH (ref 78.0–100.0)
Monocytes Absolute: 1 10*3/uL (ref 0.1–1.0)
Monocytes Relative: 9 %
NEUTROS PCT: 69 %
Neutro Abs: 7.9 10*3/uL — ABNORMAL HIGH (ref 1.7–7.7)
Platelets: 182 10*3/uL (ref 150–400)
RBC: 3.1 MIL/uL — AB (ref 3.87–5.11)
RDW: 17.9 % — ABNORMAL HIGH (ref 11.5–15.5)
WBC: 11.2 10*3/uL — AB (ref 4.0–10.5)

## 2017-03-31 LAB — COMPREHENSIVE METABOLIC PANEL
ALBUMIN: 3.1 g/dL — AB (ref 3.5–5.0)
ALT: 18 U/L (ref 14–54)
ANION GAP: 12 (ref 5–15)
AST: 35 U/L (ref 15–41)
Alkaline Phosphatase: 122 U/L (ref 38–126)
BUN: 29 mg/dL — ABNORMAL HIGH (ref 6–20)
CALCIUM: 8.8 mg/dL — AB (ref 8.9–10.3)
CO2: 23 mmol/L (ref 22–32)
CREATININE: 1.08 mg/dL — AB (ref 0.44–1.00)
Chloride: 100 mmol/L — ABNORMAL LOW (ref 101–111)
GFR calc Af Amer: 51 mL/min — ABNORMAL LOW (ref >60)
GFR calc non Af Amer: 44 mL/min — ABNORMAL LOW (ref >60)
GLUCOSE: 152 mg/dL — AB (ref 65–99)
Potassium: 4.2 mmol/L (ref 3.5–5.1)
SODIUM: 135 mmol/L (ref 135–145)
Total Bilirubin: 1.6 mg/dL — ABNORMAL HIGH (ref 0.3–1.2)
Total Protein: 5.9 g/dL — ABNORMAL LOW (ref 6.5–8.1)

## 2017-03-31 LAB — PROTIME-INR
INR: 1.95
Prothrombin Time: 22.1 seconds — ABNORMAL HIGH (ref 11.4–15.2)

## 2017-03-31 MED ORDER — FENTANYL CITRATE (PF) 100 MCG/2ML IJ SOLN
50.0000 ug | Freq: Once | INTRAMUSCULAR | Status: AC
  Administered 2017-03-31: 50 ug via INTRAVENOUS
  Filled 2017-03-31: qty 2

## 2017-03-31 MED ORDER — FENTANYL CITRATE (PF) 100 MCG/2ML IJ SOLN
25.0000 ug | Freq: Once | INTRAMUSCULAR | Status: AC
  Administered 2017-03-31: 25 ug via INTRAVENOUS
  Filled 2017-03-31: qty 2

## 2017-03-31 NOTE — ED Notes (Signed)
Patient transported to X-ray 

## 2017-03-31 NOTE — ED Provider Notes (Signed)
I saw and evaluated the patient, reviewed the resident's note and I agree with the findings and plan with the following exceptions.   84 year old female here after a supposedly witnessed fall this morning with negative x-rays prior to coming in has persistent pain.  She states initially was in the right leg but now it does seem to be in both legs.  My exam she has a surgical wound to the right lateral knee has a small hematoma associated with it but no dehiscence.  She is tender in the whole right lower extremity and in the left lower extremity from the knee up to the proximal femur.  She has no tenderness with AP or lateral compression of her pelvis.  Her abdomen is benign.  She has no pain with AP or lateral compression of her chest.  Her upper extremities are fine.  Her head is atraumatic. We will repeat x-rays as she states the pain is so bad she cannot walk.  Her mental status is slightly decreased likely secondary to medications however she is on blood thinners will get a CT to make sure she did not actually hit her head.   Merrily Pew, MD 03/31/17 [phone]

## 2017-03-31 NOTE — ED Notes (Signed)
Dr. Kim at bedside   

## 2017-03-31 NOTE — ED Triage Notes (Signed)
Patient from [facility] after witnessed this morning at 0200 this morning. EMS states patient was seen by nursing staff to walking in the hallway when she fell to her knees, did not hit head. Denies LOC. Complains of right leg pain, recent right femur fracture with surgical repair on 03/18/17. Received 1.5mg  versed from EMS. Patient is alert but confused, oriented to self, situation, and location (baseline).

## 2017-03-31 NOTE — ED Notes (Signed)
Pt left for xray

## 2017-03-31 NOTE — ED Notes (Signed)
Attempted report x 1; name and call back number provided 

## 2017-03-31 NOTE — ED Provider Notes (Signed)
[facility] EMERGENCY DEPARTMENT Provider Note   CSN: [id number] Arrival date & time: 03/31/17  1535     History   Chief Complaint Chief Complaint  Patient presents with  . Fall    HPI [name] is a 84 y.o. female.  Patient fell at about 2:30am at her rehab facility.  She attempted to walk without support as she forgot about her (healing) leg injury.  She fell.  Rehab imaged her bilateral knees to pelvis without identifying new fractures.  The patient is now unable to walk and has new left sided leg pain.  She ws transported by EMS and given 1.5mg  ativan.    Fall  This is a new problem. The current episode started 12 to 24 hours ago. Pertinent negatives include no chest pain, no abdominal pain, no headaches and no shortness of breath. Exacerbated by: weight bearing. Nothing relieves the symptoms. Treatments tried: given ativan by ems. The treatment provided mild relief.    Past Medical History:  Diagnosis Date  . Abnormality of gait   . Anemia, unspecified   . Anxiety state, unspecified   . Arthritis    BACK , NECK, AND JOINTS  . 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 CHF (congestive heart failure) ([place])   . Chronic cystitis   . Chronic steroid use   . COPD (chronic obstructive pulmonary disease) (HCC)    no inhalers used   . Depressive disorder, not elsewhere classified   . Dermatophytosis of nail   . Diabetic peripheral neuropathy ([place])   . Diaphragmatic hernia without mention of obstruction or gangrene   . Hemoperitoneum (nontraumatic)   . Herpes zoster with other nervous system complications(053.19)   . History of TIA (transient ischemic attack) 2012--  NO RESIDUAL  . HTN (hypertension)   . 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 Insulin dependent diabetes mellitus ([place])   . Macular degeneration of both eyes   . 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)   . Osteoporosis   . Osteoporosis, unspecified   . Other and unspecified  hyperlipidemia   . Personality change due to conditions classified elsewhere   . PONV (postoperative nausea and vomiting)    in past, none recently  . 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 Temporal arteritis ([place])   . Type I (juvenile type) diabetes mellitus without mention of complication, uncontrolled   . Unspecified essential hypertension   . Unspecified late effects of cerebrovascular disease   . Urinary frequency     Patient Active Problem List   Diagnosis Date Noted  . Periprosthetic fracture around internal prosthetic right hip joint ([place]) 03/17/2017  . Atrial fibrillation ([place]) 03/13/2017  . Closed fracture of condyle of right femur ([place]) 03/13/2017  . Type 2 diabetes mellitus with renal manifestations ([place]) 03/13/2017  . Femur fracture ([place]) 03/13/2017  . Atrophic vaginitis 12/11/2013  . UTI (lower urinary tract infection) 11/16/2013  . Acute encephalopathy 11/16/2013  . FTT (failure to thrive) in adult 11/16/2013  . Subdural hematoma ([place]) 11/06/2013  . Fall 11/06/2013  . Hematoma of leg 09/04/2013  . Generalized osteoarthritis of hand 09/04/2013  . Cerumen impaction 09/04/2013  . Rales 01/15/2013  . Chronic kidney disease, stage III (moderate) ([place]) 12/19/2012  . Sinus congestion 10/24/2012  . Fracture of femoral neck, right, closed ([place]) 08/02/2012  . Hemoperitoneum (nontraumatic) 06/19/2012  . 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 Dysphagia, unspecified(787.20) 06/19/2012  .  Urinary frequency 06/19/2012  . Unspecified late effects of cerebrovascular disease 06/19/2012  . Personal history of fall 06/19/2012  . Abnormality of gait 06/19/2012  . Synovial cyst of popliteal space 06/19/2012  . Muscle weakness (generalized) 06/19/2012  . Osteoporosis, unspecified 06/19/2012  . Anxiety state, unspecified 06/19/2012  . Osteoarthrosis, unspecified whether generalized or localized, unspecified site 06/19/2012  .  Pain in limb 06/19/2012  . Knee joint replacement by other means 06/19/2012  . Pain in joint, lower leg 06/19/2012  . Hyperlipidemia 06/19/2012  . Cervicalgia 06/19/2012  . Lack of coordination 06/19/2012  . Pain in joint, shoulder region 06/19/2012  . Dermatophytosis of nail 06/19/2012  . Unspecified urinary incontinence 06/19/2012  . Herpes zoster without mention of complication 91/47/8295  . Unspecified hereditary and idiopathic peripheral neuropathy 06/19/2012  . Osteoarthrosis, unspecified whether generalized or localized, lower leg 06/19/2012  . Lumbago 06/19/2012  . Enthesopathy of hip region 06/19/2012  . Internal hemorrhoids without mention of complication 62/13/0865  . Diaphragmatic hernia without mention of obstruction or gangrene 06/19/2012  . Renal abscess, right 06/11/2012  . Recurrent UTI 06/07/2012  . SIRS (systemic inflammatory response syndrome) ([place]) 06/07/2012  . Retroperitoneal bleed 03/20/2012  . Chronic heart failure ([place]) 03/20/2012  . 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 Degenerative arthritis of cervical spine 02/09/2011  . Degenerative arthritis of hip 02/09/2011  . Degenerative arthritis of knee 02/09/2011  . Carpal tunnel syndrome of right wrist 02/09/2011  . TIA (transient ischemic attack) 02/09/2011  . Anemia 01/31/2011  . Hypoxia 01/31/2011  . Renal oncocytoma 01/12/2011  . Type II or unspecified type diabetes mellitus with renal manifestations, uncontrolled(250.42) 01/11/2011  . Essential hypertension 01/11/2011  . Polymyalgia rheumatica ([place]) 01/11/2011  . Depression 01/11/2011    Past Surgical History:  Procedure Laterality Date  . ANTERIOR CERVICAL DECOMP/DISCECTOMY FUSION  10-12-2005   C5 -- C7  . CARPAL TUNNEL RELEASE Right 2009  (APPROX)  . CATARACT EXTRACTION W/ INTRAOCULAR LENS  IMPLANT, BILATERAL Right Oct 1992   Epes  . CATARACT EXTRACTION W/ INTRAOCULAR LENS IMPLANT Left 1993   [place]  . CRYOABLATION RIGHT RENAL MASS   03-16-2012   ONCOCYTOMA (POST OP 06-09-2012 PERCUTANEOUS DRAIN PLACEMENT FOR PERINEPHRIC ABSCESS)  . CYSTOSCOPY W/ URETERAL STENT PLACEMENT Right 07/10/2012   Procedure: CYSTOSCOPY WITH RIGHT RETROGRADE PYELOGRAM/URETERAL STENT PLACEMENT;  Surgeon: Malka So, MD;  Location: [facility];  Service: Urology;  Laterality: Right;  . ELBOW SURGERY Bilateral 2003  (APPROX)  . HIP ARTHROPLASTY Right 08/03/2012   Procedure: CEMENTED MONOPOLAR HIP;  Surgeon: Marybelle Killings, MD;  Location: [facility];  Service: Orthopedics;  Laterality: Right;  . LUMBAR LAMINECTOMY  11-19-2003   L3 -- L4  . ORIF FEMUR FRACTURE Right 03/15/2017   Procedure: OPEN REDUCTION INTERNAL FIXATION (ORIF) DISTAL FEMUR FRACTURE;  Surgeon: Shona Needles, MD;  Location: [facility];  Service: Orthopedics;  Laterality: Right;  . ORIF W/ PLATE RIGHT DISTAL RADIUS FX  06-18-2003  . POSTERIOR FUSION CERVICAL SPINE  02-10-2006   C5 -- C7  . TEMPORAL ARTERY BIOPSY / LIGATION    . TOTAL HIP ARTHROPLASTY Left 01-22-2005  . TOTAL KNEE ARTHROPLASTY Bilateral LEFT 08-08-2007;  RIGHT 05-08-2009    OB History    No data available       Home Medications    Prior to Admission medications   Medication Sig Start Date End Date Taking? Authorizing Provider  apixaban (ELIQUIS) 5 MG TABS tablet Take 2 tablets (10 mg total) by mouth  2 (two) times daily for 6 days. 03/18/17 03/24/17  Doreatha Lew, MD  apixaban (ELIQUIS) 5 MG TABS tablet Take 1 tablet (5 mg total) by mouth 2 (two) times daily. 03/25/17   Doreatha Lew, MD  feeding supplement, GLUCERNA SHAKE, (GLUCERNA SHAKE) LIQD Take 237 mLs by mouth 2 (two) times daily between meals. 01/02/14   Lauree Chandler, NP  ferrous sulfate 325 (65 FE) MG tablet Take 1 tablet (325 mg total) by mouth 2 (two) times daily with a meal. 03/18/17   Patrecia Pour, Christean Grief, MD  gabapentin (NEURONTIN) 300 MG capsule Take 1 capsule (300 mg total) by mouth 3 (three) times daily. Taking 1 tab in the am ,  and 2 tabs in the pm. 01/02/14   Lauree Chandler, NP  glimepiride (AMARYL) 2 MG tablet Take 2 mg by mouth daily with breakfast.    [provider]  linagliptin (TRADJENTA) 5 MG TABS tablet Take 1 tablet (5 mg total) by mouth daily. 01/02/14   Lauree Chandler, NP  metoprolol tartrate (LOPRESSOR) 25 MG tablet Take 0.5 tablets (12.5 mg total) by mouth 2 (two) times daily. 03/18/17   Doreatha Lew, MD  omeprazole (PRILOSEC) 20 MG capsule Take 1 capsule (20 mg total) by mouth daily. 10/30/16   Little, Wenda Overland, MD  oxybutynin (DITROPAN) 5 MG tablet Take 1 tablet (5 mg total) by mouth 3 (three) times daily as needed for bladder spasms (Up to 3 times daily to control bladder spasms.). 01/02/14   Lauree Chandler, NP  oxyCODONE-acetaminophen (PERCOCET/ROXICET) 5-325 MG tablet Take 2 tablets by mouth every 4 (four) hours as needed for moderate pain. 03/18/17   Doreatha Lew, MD  predniSONE (DELTASONE) 1 MG tablet Take 3 tablets (3 mg total) by mouth daily with breakfast. 03/20/17   Patrecia Pour, Christean Grief, MD  ursodiol (ACTIGALL) 300 MG capsule Take 150 mg by mouth 2 (two) times daily.    [provider]    Family History Family History  Problem Relation Age of Onset  . Cancer Mother   . Cancer Father   . Cancer Brother   . Cancer Daughter   . Leukemia Unknown     Social History Social History   Tobacco Use  . Smoking status: Former Smoker    Packs/day: 2.00    Years: 30.00    Pack years: 60.00    Types: Cigarettes    Last attempt to quit: 01/11/1983    Years since quitting: 34.2  . Smokeless tobacco: Never Used  Substance Use Topics  . Alcohol use: No  . Drug use: No     Allergies   Allegra [fexofenadine hcl]; Levaquin [levofloxacin in d5w]; Tussionex pennkinetic er [employer] er]; Codeine; and Morphine and related   Review of Systems Review of Systems  Constitutional: Negative for chills and fever.  HENT: Negative for ear pain  and sore throat.   Eyes: Negative for pain and visual disturbance.  Respiratory: Negative for cough and shortness of breath.   Cardiovascular: Negative for chest pain and palpitations.  Gastrointestinal: Negative for abdominal pain and vomiting.  Genitourinary: Negative for dysuria and hematuria.  Musculoskeletal: Negative for arthralgias and back pain.  Skin: Negative for color change and rash.  Neurological: Negative for seizures, syncope and headaches.  All other systems reviewed and are negative.    Physical Exam Updated Vital Signs BP (!) 141/76 (BP Location: Right Arm)   Pulse 96   Temp 98.3 F (36.8 C) (Oral)  Resp 16   SpO2 99%   Physical Exam  Constitutional: She appears well-developed and well-nourished. No distress.  HENT:  Head: Normocephalic and atraumatic.  Eyes: Conjunctivae and EOM are normal. Pupils are equal, round, and reactive to light.  Neck: Neck supple.  Cardiovascular: Regular rhythm.  tachycardia  Pulmonary/Chest: Effort normal and breath sounds normal. No respiratory distress.  Abdominal: Soft. There is no tenderness.  Musculoskeletal: She exhibits no edema.  Tenderness to bilateral upper legs and knees.  Right femur/knee with well healing post surgical incisions.  Neurological:  Sleepy. Oriented to person and place, not time. (Thinks year is 75.)  Skin: Skin is warm and dry.  Psychiatric: She has a normal mood and affect.  Nursing note and vitals reviewed.    ED Treatments / Results  Labs (all labs ordered are listed, but only abnormal results are displayed) Labs Reviewed - No data to display  EKG  EKG Interpretation None       Radiology No results found.  Procedures Procedures (including critical care time)  Medications Ordered in ED Medications - No data to display   Initial Impression / Assessment and Plan / ED Course  I have reviewed the triage vital signs and the nursing notes.  Pertinent labs & imaging results that  were available during my care of the patient were reviewed by me and considered in my medical decision making (see chart for details).     [name] is a 84 year old female with past medical history significant for insulin-dependent diabetes, A. fib on anticoagulation, hypertension, renal malignancy, and anemia who presents for evaluation after falling at her rehab facility.  X-ray reports were reviewed from the rehab facility and identified no new fractures.  X-rays of the bilateral femurs, knees, and pelvis/hips were obtained, personally reviewed by me, demonstrate no acute fractures.  CT had obtained in context of anticoagulation.  Results are significant for no acute bleeding.  Patient metabolized Ativan and required IV pain management.  Patient was able to bear weight on left leg prior to falling and is now unable to bear weight to that extremity.  Patient admitted to hospitalist.  Final Clinical Impressions(s) / ED Diagnoses   Final diagnoses:  Fall    ED Discharge Orders    None       Elveria Rising, MD 03/31/17 2356    Merrily Pew, MD 03/31/17 2358

## 2017-03-31 NOTE — H&P (Addendum)
TRH H&P   Patient Demographics:    [name], is a 84 y.o. female  MRN: [id number]   DOB - Nov 11, 1924  Admit Date - 03/31/2017  Outpatient Primary MD for the patient is Patient, No Pcp Per  Referring MD/NP/PA:  Lilian Coma  Outpatient Specialists:    Patient coming from:  SNF, [facility]  Chief Complaint  Patient presents with  . Fall      HPI:    [name]  is a 84 y.o. female, w Diabetes w neuropathy,  Hypertension, Hyperlipidemia, Tia, Temporal arteritis, ITP ?, Copd, Osteoporosis apparently s/p recent ORIF R femur fracture 03/15/2017 presents from SNF with fall while trying to walk to bathroom and inability to bear weight.     In ED,  Per SNF records there was some question of nonhealing of recent femur fracture, but family states that she was seen by orthopedics who told them it was healing fine.   Wbc 11.2, Hgb 10.0, Plt 182 MCV 102.3   Na 135, K 4.2, Glucose 152, Bun 29, Creatinine 1.08 Alb 3.1,  Ast 24, Alt 18 INR 1.95  CT brain IMPRESSION: 1. Senescent changes without acute finding. 2. Chronic left maxillary sinusitis.  R femur IMPRESSION: No acute finding.  Status post fixation of a comminuted right femur fracture.  Status post right hip and knee replacement.  L femur IMPRESSION: 1. Status post left hip and knee replacement with normal alignment 2. Moderate to large knee effusion with meniscus level but no definitive fracture seen.   ED requesting admission due to patients inability to bear weight on left side. / left leg pain   Review of systems:    In addition to the HPI above,  No Fever-chills, No Headache, No changes with Vision or hearing, No problems swallowing food or Liquids, No Chest pain, Cough or Shortness of Breath, No Abdominal pain, No Nausea or Vommitting, Bowel movements are regular, No Blood in stool or  Urine, No dysuria, No new skin rashes or bruises,  No new weakness, tingling, numbness in any extremity, No recent weight gain or loss, No polyuria, polydypsia or polyphagia, No significant Mental Stressors.  A full 10 point Review of Systems was done, except as stated above, all other Review of Systems were negative.   With Past History of the following :    Past Medical History:  Diagnosis Date  . Abnormality of gait   . Anemia, unspecified   . Anxiety state, unspecified   . Arthritis    BACK , NECK, AND JOINTS  . 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 CHF (congestive heart failure) ([place])   . Chronic cystitis   . Chronic steroid use   . COPD (chronic obstructive pulmonary disease) (HCC)    no inhalers used   . Depressive disorder, not elsewhere classified   . Dermatophytosis of nail   . Diabetic peripheral neuropathy (  [place])   . Diaphragmatic hernia without mention of obstruction or gangrene   . Hemoperitoneum (nontraumatic)   . Herpes zoster with other nervous system complications(053.19)   . History of TIA (transient ischemic attack) 2012--  NO RESIDUAL  . HTN (hypertension)   . 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 Insulin dependent diabetes mellitus ([place])   . Macular degeneration of both eyes   . Malignant neoplasm of renal pelvis ([place])   . Osteoporosis   . Osteoporosis, unspecified   . Other and unspecified hyperlipidemia   . Personality change due to conditions classified elsewhere   . PONV (postoperative nausea and vomiting)    in past, none recently  . 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 Temporal arteritis ([place])   . Type I (juvenile type) diabetes mellitus without mention of complication, uncontrolled   . Unspecified essential hypertension   . Unspecified late effects of cerebrovascular disease   . Urinary frequency       Past Surgical History:  Procedure Laterality Date  . ANTERIOR CERVICAL  DECOMP/DISCECTOMY FUSION  10-12-2005   C5 -- C7  . CARPAL TUNNEL RELEASE Right 2009  (APPROX)  . CATARACT EXTRACTION W/ INTRAOCULAR LENS  IMPLANT, BILATERAL Right Oct 1992   Epes  . CATARACT EXTRACTION W/ INTRAOCULAR LENS IMPLANT Left 1993   Epes  . CRYOABLATION RIGHT RENAL MASS  03-16-2012   ONCOCYTOMA (POST OP 06-09-2012 PERCUTANEOUS DRAIN PLACEMENT FOR PERINEPHRIC ABSCESS)  . CYSTOSCOPY W/ URETERAL STENT PLACEMENT Right 07/10/2012   Procedure: CYSTOSCOPY WITH RIGHT RETROGRADE PYELOGRAM/URETERAL STENT PLACEMENT;  Surgeon: Malka So, MD;  Location[facility];  Service: Urology;  Laterality: Right;  . ELBOW SURGERY Bilateral 2003  (APPROX)  . HIP ARTHROPLASTY Right 08/03/2012   Procedure: CEMENTED MONOPOLAR HIP;  Surgeon: Marybelle Killings, MD;  Location: [facility];  Service: Orthopedics;  Laterality: Right;  . LUMBAR LAMINECTOMY  11-19-2003   L3 -- L4  . ORIF FEMUR FRACTURE Right 03/15/2017   Procedure: OPEN REDUCTION INTERNAL FIXATION (ORIF) DISTAL FEMUR FRACTURE;  Surgeon: Shona Needles, MD;  Location: [facility];  Service: Orthopedics;  Laterality: Right;  . ORIF W/ PLATE RIGHT DISTAL RADIUS FX  06-18-2003  . POSTERIOR FUSION CERVICAL SPINE  02-10-2006   C5 -- C7  . TEMPORAL ARTERY BIOPSY / LIGATION    . TOTAL HIP ARTHROPLASTY Left 01-22-2005  . TOTAL KNEE ARTHROPLASTY Bilateral LEFT 08-08-2007;  RIGHT 05-08-2009      Social History:     Social History   Tobacco Use  . Smoking status: Former Smoker    Packs/day: 2.00    Years: 30.00    Pack years: 60.00    Types: Cigarettes    Last attempt to quit: 01/11/1983    Years since quitting: 34.2  . Smokeless tobacco: Never Used  Substance Use Topics  . Alcohol use: No     Lives - currently at SNF, [facility]  Mobility - walks by self prior to femur fracture   Family History :     Family History  Problem Relation Age of Onset  . Cancer Mother   . Cancer Father   . Cancer Brother   . Cancer Daughter   .  Leukemia Unknown       Home Medications:   Prior to Admission medications   Medication Sig Start Date End Date Taking? Authorizing Provider  acetaminophen (TYLENOL) 325 MG tablet Take 650 mg by mouth every 6 (six) hours  as needed for fever or headache (pain).   Yes [provider]  apixaban (ELIQUIS) 5 MG TABS tablet Take 1 tablet (5 mg total) by mouth 2 (two) times daily. 03/25/17  Yes Doreatha Lew, MD  dextromethorphan-guaiFENesin (DIABETIC TUSSIN DM) 10-100 MG/5ML liquid Take 15 mLs by mouth every 4 (four) hours as needed for cough.   Yes [provider]  ferrous sulfate 325 (65 FE) MG tablet Take 1 tablet (325 mg total) by mouth 2 (two) times daily with a meal. 03/18/17  Yes Patrecia Pour, Christean Grief, MD  gabapentin (NEURONTIN) 300 MG capsule Take 1 capsule (300 mg total) by mouth 3 (three) times daily. Taking 1 tab in the am , and 2 tabs in the pm. Patient taking differently: Take 300 mg by mouth 3 (three) times daily.  01/02/14  Yes Lauree Chandler, NP  glimepiride (AMARYL) 2 MG tablet Take 2 mg by mouth daily with breakfast.   Yes [provider]  linagliptin (TRADJENTA) 5 MG TABS tablet Take 1 tablet (5 mg total) by mouth daily. 01/02/14  Yes Lauree Chandler, NP  metoprolol tartrate (LOPRESSOR) 25 MG tablet Take 0.5 tablets (12.5 mg total) by mouth 2 (two) times daily. 03/18/17  Yes Patrecia Pour, Christean Grief, MD  mirtazapine (REMERON) 15 MG tablet Take 15 mg by mouth at bedtime.   Yes [provider]  mupirocin ointment (BACTROBAN) 2 % Place 1 application into the nose See admin instructions. Apply topically twice daily to open affected areas on back of neck until healed.   Yes [provider]  oxybutynin (DITROPAN) 5 MG tablet Take 1 tablet (5 mg total) by mouth 3 (three) times daily as needed for bladder spasms (Up to 3 times daily to control bladder spasms.). 01/02/14  Yes Lauree Chandler, NP  ranitidine (ZANTAC) 150 MG tablet Take 150 mg  by mouth daily.   Yes [provider]  senna (SENOKOT) 8.6 MG TABS tablet Take 1 tablet by mouth daily as needed (constipation).   Yes [provider]  tiZANidine (ZANAFLEX) 4 MG tablet Take 4 mg by mouth 3 (three) times daily as needed for muscle spasms.   Yes [provider]  traMADol (ULTRAM) 50 MG tablet Take 50 mg by mouth every 6 (six) hours as needed (pain).   Yes [provider]  ursodiol (ACTIGALL) 300 MG capsule Take 150 mg by mouth daily.    Yes [provider]  feeding supplement, GLUCERNA SHAKE, (GLUCERNA SHAKE) LIQD Take 237 mLs by mouth 2 (two) times daily between meals. Patient not taking: Reported on 03/31/2017 01/02/14   Lauree Chandler, NP  omeprazole (PRILOSEC) 20 MG capsule Take 1 capsule (20 mg total) by mouth daily. Patient not taking: Reported on 03/31/2017 10/30/16   Little, Wenda Overland, MD  oxyCODONE-acetaminophen (PERCOCET/ROXICET) 5-325 MG tablet Take 2 tablets by mouth every 4 (four) hours as needed for moderate pain. Patient not taking: Reported on 03/31/2017 03/18/17   Patrecia Pour, Christean Grief, MD  predniSONE (DELTASONE) 1 MG tablet Take 3 tablets (3 mg total) by mouth daily with breakfast. Patient not taking: Reported on 03/31/2017 03/20/17   Doreatha Lew, MD     Allergies:     Allergies  Allergen Reactions  . Allegra [Fexofenadine Hcl]     Unknown reaction  . Levaquin [Levofloxacin In D5w] Other (See Comments)    Unknown reaction  . Tussionex Pennkinetic Er [Hydrocod Polst-Cpm Polst Er] Nausea And Vomiting  . Codeine Nausea And Vomiting  .  Morphine And Related Nausea And Vomiting     Physical Exam:   Vitals  Blood pressure 123/78, pulse 97, temperature 98.3 F (36.8 C), temperature source Oral, resp. rate 15, SpO2 97 %.   1. General  lying in bed in NAD,   2. Normal affect and insight, Not Suicidal or Homicidal, Awake Alert, Oriented X 3.  3. No F.N deficits, ALL C.Nerves Intact, Strength 5/5 all 4  extremities, Sensation intact all 4 extremities, Plantars down going.  4. Ears and Eyes appear Normal, Conjunctivae clear, PERRLA. Moist Oral Mucosa.  5. Supple Neck, No JVD, No cervical lymphadenopathy appriciated, No Carotid Bruits.  6. Symmetrical Chest wall movement, Good air movement bilaterally, CTAB.  7. RRR, No Gallops, Rubs or Murmurs, No Parasternal Heave.  8. Positive Bowel Sounds, Abdomen Soft, No tenderness, No organomegaly appriciated,No rebound -guarding or rigidity.  9.  No Cyanosis, Normal Skin Turgor, No Skin Rash or Bruise.  10. Good muscle tone,  joints appear normal , no effusions, Normal ROM.  11. No Palpable Lymph Nodes in Neck or Axillae  Bilateral TKA scars,  Slight swelling left knee, no erythema, minimal warmth   Data Review:    CBC Recent Labs  Lab 03/31/17 1604  WBC 11.2*  HGB 10.0*  HCT 31.7*  PLT 182  MCV 102.3*  MCH 32.3  MCHC 31.5  RDW 17.9*  LYMPHSABS 2.3  MONOABS 1.0  EOSABS 0.1  BASOSABS 0.0   ------------------------------------------------------------------------------------------------------------------  Chemistries  Recent Labs  Lab 03/31/17 1604  NA 135  K 4.2  CL 100*  CO2 23  GLUCOSE 152*  BUN 29*  CREATININE 1.08*  CALCIUM 8.8*  AST 35  ALT 18  ALKPHOS 122  BILITOT 1.6*   ------------------------------------------------------------------------------------------------------------------ CrCl cannot be calculated (Unknown ideal weight.). ------------------------------------------------------------------------------------------------------------------ No results for input(s): TSH, T4TOTAL, T3FREE, THYROIDAB in the last 72 hours.  Invalid input(s): FREET3  Coagulation profile Recent Labs  Lab 03/31/17 1604  INR 1.95   ------------------------------------------------------------------------------------------------------------------- No results for input(s): DDIMER in the last 72  hours. -------------------------------------------------------------------------------------------------------------------  Cardiac Enzymes No results for input(s): CKMB, TROPONINI, MYOGLOBIN in the last 168 hours.  Invalid input(s): CK ------------------------------------------------------------------------------------------------------------------    Component Value Date/Time   BNP 281.3 (H) 02/10/2017 1052     ---------------------------------------------------------------------------------------------------------------  Urinalysis    Component Value Date/Time   COLORURINE YELLOW 03/14/2017 1024   APPEARANCEUR CLEAR 03/14/2017 1024   APPEARANCEUR Clear 05/07/2013 1436   LABSPEC 1.019 03/14/2017 1024   PHURINE 5.0 03/14/2017 1024   GLUCOSEU 50 (A) 03/14/2017 1024   HGBUR SMALL (A) 03/14/2017 1024   BILIRUBINUR NEGATIVE 03/14/2017 1024   BILIRUBINUR Neg 12/11/2013 1345   BILIRUBINUR Negative 05/07/2013 1436   KETONESUR 20 (A) 03/14/2017 1024   PROTEINUR NEGATIVE 03/14/2017 1024   UROBILINOGEN 0.2 12/11/2013 1345   UROBILINOGEN 1.0 11/16/2013 1256   NITRITE NEGATIVE 03/14/2017 1024   LEUKOCYTESUR TRACE (A) 03/14/2017 1024   LEUKOCYTESUR Negative 05/07/2013 1436    ----------------------------------------------------------------------------------------------------------------   Imaging Results:    Dg Chest 1 View  Result Date: 03/31/2017 CLINICAL DATA:  Fall EXAM: CHEST 1 VIEW COMPARISON:  03/14/2017 FINDINGS: Postsurgical changes in the cervical spine. Mild atelectasis at the left base. No focal consolidation. No pneumothorax. Mild cardiomegaly with aortic atherosclerosis. Treated vertebral compression fracture in the midthoracic spine. IMPRESSION: 1. Minimal left basilar atelectasis. 2. Stable mild cardiomegaly. Electronically Signed   By: Donavan Foil M.D.   On: 03/31/2017 18:18   Dg Pelvis 1-2 Views  Result Date: 03/31/2017 CLINICAL DATA:  Fall EXAM: PELVIS - 1-2  VIEW COMPARISON:  CT abdomen pelvis 09/11/2016 FINDINGS: Status post bilateral hip replacements with normal alignment. No acute displaced fracture is seen. The SI joints do not appear widened. Pubic symphysis and rami are intact. IMPRESSION: Postsurgical changes of both hips. No definite acute osseous abnormality Electronically Signed   By: Donavan Foil M.D.   On: 03/31/2017 18:16   Ct Head Wo Contrast  Result Date: 03/31/2017 CLINICAL DATA:  Altered level of consciousness, unexplained. Confusion EXAM: CT HEAD WITHOUT CONTRAST TECHNIQUE: Contiguous axial images were obtained from the base of the skull through the vertex without intravenous contrast. COMPARISON:  03/13/2017 FINDINGS: Brain: No evidence of acute infarction, hemorrhage, hydrocephalus, extra-axial collection or mass lesion/mass effect. Mild chronic small vessel ischemia in the periventricular white matter. Generalized volume loss, age congruent. Vascular: Atherosclerotic calcification.  No hyperdense vessel. Skull: Negative Sinuses/Orbits: Chronic left maxillary sinusitis with complete opacification. This has been seen since at least 2012. bilateral cataract resection. IMPRESSION: 1. Senescent changes without acute finding. 2. Chronic left maxillary sinusitis. Electronically Signed   By: Monte Fantasia M.D.   On: 03/31/2017 16:38   Dg Femur Min 2 Views Left  Result Date: 03/31/2017 CLINICAL DATA:  Bilateral leg pain EXAM: LEFT FEMUR 2 VIEWS COMPARISON:  03/14/2017 FINDINGS: No acute displaced fracture is seen. Patient is status post left hip replacement with normal alignment. Status post left knee replacement with normal alignment. Moderate to large knee effusion with meniscus level. Vascular calcifications. IMPRESSION: 1. Status post left hip and knee replacement with normal alignment 2. Moderate to large knee effusion with meniscus level but no definitive fracture seen. Electronically Signed   By: Donavan Foil M.D.   On: 03/31/2017 18:14    Dg Femur Min 2 Views Right  Result Date: 03/31/2017 CLINICAL DATA:  Status post fall today.  Initial encounter. EXAM: RIGHT FEMUR 2 VIEWS COMPARISON:  Plain films of the right femur 03/15/2017, 03/13/2017 and 03/17/2017. FINDINGS: Plate and screw fixation of a comminuted diaphyseal fracture appears unchanged since the most recent exams. The patient is status post right hip and knee replacement. No acute abnormality is identified. IMPRESSION: No acute finding. Status post fixation of a comminuted right femur fracture. Status post right hip and knee replacement. Electronically Signed   By: Inge Rise M.D.   On: 03/31/2017 18:07      Assessment & Plan:    Principal Problem:   Leg pain Active Problems:   Anemia   Chronic kidney disease, stage III (moderate) (HCC)   Atrial fibrillation (HCC)    Left leg pain Please consult orthopedics in the AM  Regarding her left knee ? Effusion as well as to make sure no further imaging such as CT needed to exclude fracture after fall  Tachycardia probably due to pain  Tele Trop I q6h x3  Consider cardiac echo if persistent Metoprolol 2.5mg  iv x1 Cont metoprolol 12.5mg  po bid  Anemia Check cbc in am  CKD stage 3 Check cmp in am  Pafib xarelto pharmacy to dose Cont metoprolol  Dm2 Fsbs ac and qhs ISS Cont Amaryl Cont Tradjenta  DVT Prophylaxis Xarelto   SCDs  AM Labs Ordered, also please review Full Orders  Family Communication: Admission, patients condition and plan of care including tests being ordered have been discussed with the patient  who indicate understanding and agree with the plan and Code Status.  Code Status FULL CODE  Likely DC to  home  Condition GUARDED   Consults  called:   Please call orthopedics in AM  Admission status:   observation  Time spent in minutes : 45   Jani Gravel M.D on 03/31/2017 at 10:09 PM  Between 7am to 7pm - Pager - [phone]  . After 7pm go to [email or web address] - password  [facility]  [facility] - Office  [phone]

## 2017-03-31 NOTE — ED Notes (Signed)
Attempted report x 2; name and call back number provided 

## 2017-04-01 DIAGNOSIS — Z66 Do not resuscitate: Secondary | ICD-10-CM | POA: Diagnosis present

## 2017-04-01 DIAGNOSIS — I480 Paroxysmal atrial fibrillation: Secondary | ICD-10-CM | POA: Diagnosis not present

## 2017-04-01 DIAGNOSIS — M315 Giant cell arteritis with polymyalgia rheumatica: Secondary | ICD-10-CM | POA: Diagnosis present

## 2017-04-01 DIAGNOSIS — Y92129 Unspecified place in nursing home as the place of occurrence of the external cause: Secondary | ICD-10-CM | POA: Diagnosis not present

## 2017-04-01 DIAGNOSIS — M810 Age-related osteoporosis without current pathological fracture: Secondary | ICD-10-CM | POA: Diagnosis present

## 2017-04-01 DIAGNOSIS — M79605 Pain in left leg: Secondary | ICD-10-CM

## 2017-04-01 DIAGNOSIS — M79606 Pain in leg, unspecified: Secondary | ICD-10-CM | POA: Diagnosis not present

## 2017-04-01 DIAGNOSIS — E11319 Type 2 diabetes mellitus with unspecified diabetic retinopathy without macular edema: Secondary | ICD-10-CM | POA: Diagnosis present

## 2017-04-01 DIAGNOSIS — E860 Dehydration: Secondary | ICD-10-CM | POA: Diagnosis present

## 2017-04-01 DIAGNOSIS — E1122 Type 2 diabetes mellitus with diabetic chronic kidney disease: Secondary | ICD-10-CM | POA: Diagnosis present

## 2017-04-01 DIAGNOSIS — D638 Anemia in other chronic diseases classified elsewhere: Secondary | ICD-10-CM | POA: Diagnosis present

## 2017-04-01 DIAGNOSIS — E785 Hyperlipidemia, unspecified: Secondary | ICD-10-CM | POA: Diagnosis present

## 2017-04-01 DIAGNOSIS — E1142 Type 2 diabetes mellitus with diabetic polyneuropathy: Secondary | ICD-10-CM | POA: Diagnosis present

## 2017-04-01 DIAGNOSIS — R579 Shock, unspecified: Secondary | ICD-10-CM | POA: Diagnosis present

## 2017-04-01 DIAGNOSIS — J449 Chronic obstructive pulmonary disease, unspecified: Secondary | ICD-10-CM | POA: Diagnosis present

## 2017-04-01 DIAGNOSIS — D696 Thrombocytopenia, unspecified: Secondary | ICD-10-CM | POA: Diagnosis present

## 2017-04-01 DIAGNOSIS — I482 Chronic atrial fibrillation: Secondary | ICD-10-CM | POA: Diagnosis not present

## 2017-04-01 DIAGNOSIS — L8990 Pressure ulcer of unspecified site, unspecified stage: Secondary | ICD-10-CM | POA: Diagnosis present

## 2017-04-01 DIAGNOSIS — N183 Chronic kidney disease, stage 3 (moderate): Secondary | ICD-10-CM | POA: Diagnosis present

## 2017-04-01 DIAGNOSIS — W19XXXD Unspecified fall, subsequent encounter: Secondary | ICD-10-CM | POA: Diagnosis not present

## 2017-04-01 DIAGNOSIS — H3530 Unspecified macular degeneration: Secondary | ICD-10-CM | POA: Diagnosis present

## 2017-04-01 DIAGNOSIS — F329 Major depressive disorder, single episode, unspecified: Secondary | ICD-10-CM | POA: Diagnosis present

## 2017-04-01 DIAGNOSIS — W1830XA Fall on same level, unspecified, initial encounter: Secondary | ICD-10-CM | POA: Diagnosis present

## 2017-04-01 DIAGNOSIS — N390 Urinary tract infection, site not specified: Secondary | ICD-10-CM | POA: Diagnosis present

## 2017-04-01 DIAGNOSIS — I509 Heart failure, unspecified: Secondary | ICD-10-CM | POA: Diagnosis present

## 2017-04-01 DIAGNOSIS — E440 Moderate protein-calorie malnutrition: Secondary | ICD-10-CM | POA: Diagnosis present

## 2017-04-01 DIAGNOSIS — I130 Hypertensive heart and chronic kidney disease with heart failure and stage 1 through stage 4 chronic kidney disease, or unspecified chronic kidney disease: Secondary | ICD-10-CM | POA: Diagnosis present

## 2017-04-01 DIAGNOSIS — D649 Anemia, unspecified: Secondary | ICD-10-CM | POA: Diagnosis not present

## 2017-04-01 DIAGNOSIS — E1165 Type 2 diabetes mellitus with hyperglycemia: Secondary | ICD-10-CM | POA: Diagnosis not present

## 2017-04-01 DIAGNOSIS — F419 Anxiety disorder, unspecified: Secondary | ICD-10-CM | POA: Diagnosis present

## 2017-04-01 DIAGNOSIS — D509 Iron deficiency anemia, unspecified: Secondary | ICD-10-CM | POA: Diagnosis present

## 2017-04-01 LAB — CBC WITH DIFFERENTIAL/PLATELET
BASOS ABS: 0 10*3/uL (ref 0.0–0.1)
BASOS ABS: 0 10*3/uL (ref 0.0–0.1)
Basophils Relative: 0 %
Basophils Relative: 0 %
Eosinophils Absolute: 0.1 10*3/uL (ref 0.0–0.7)
Eosinophils Absolute: 0.1 10*3/uL (ref 0.0–0.7)
Eosinophils Relative: 1 %
Eosinophils Relative: 1 %
HEMATOCRIT: 25.2 % — AB (ref 36.0–46.0)
HEMATOCRIT: 24.3 % — AB (ref 36.0–46.0)
HEMOGLOBIN: 7.9 g/dL — AB (ref 12.0–15.0)
Hemoglobin: 7.7 g/dL — ABNORMAL LOW (ref 12.0–15.0)
LYMPHS ABS: 2 10*3/uL (ref 0.7–4.0)
LYMPHS PCT: 19 %
LYMPHS PCT: 17 %
Lymphs Abs: 2.5 10*3/uL (ref 0.7–4.0)
MCH: 32.2 pg (ref 26.0–34.0)
MCH: 32.5 pg (ref 26.0–34.0)
MCHC: 31.3 g/dL (ref 30.0–36.0)
MCHC: 31.7 g/dL (ref 30.0–36.0)
MCV: 102.9 fL — AB (ref 78.0–100.0)
MCV: 102.5 fL — AB (ref 78.0–100.0)
MONO ABS: 1.3 10*3/uL — AB (ref 0.1–1.0)
MONOS PCT: 11 %
Monocytes Absolute: 1.6 10*3/uL — ABNORMAL HIGH (ref 0.1–1.0)
Monocytes Relative: 12 %
NEUTROS ABS: 8.9 10*3/uL — AB (ref 1.7–7.7)
NEUTROS ABS: 8.4 10*3/uL — AB (ref 1.7–7.7)
Neutrophils Relative %: 68 %
Neutrophils Relative %: 71 %
Platelets: 150 10*3/uL (ref 150–400)
Platelets: 147 10*3/uL — ABNORMAL LOW (ref 150–400)
RBC: 2.45 MIL/uL — AB (ref 3.87–5.11)
RBC: 2.37 MIL/uL — ABNORMAL LOW (ref 3.87–5.11)
RDW: 17.4 % — ABNORMAL HIGH (ref 11.5–15.5)
RDW: 17.3 % — AB (ref 11.5–15.5)
WBC: 13.1 10*3/uL — AB (ref 4.0–10.5)
WBC: 11.7 10*3/uL — ABNORMAL HIGH (ref 4.0–10.5)

## 2017-04-01 LAB — URINALYSIS, ROUTINE W REFLEX MICROSCOPIC
Bilirubin Urine: NEGATIVE
GLUCOSE, UA: NEGATIVE mg/dL
Hgb urine dipstick: NEGATIVE
Ketones, ur: 5 mg/dL — AB
NITRITE: POSITIVE — AB
PH: 5 (ref 5.0–8.0)
PROTEIN: NEGATIVE mg/dL
SPECIFIC GRAVITY, URINE: 1.02 (ref 1.005–1.030)

## 2017-04-01 LAB — CBC
HEMATOCRIT: 29 % — AB (ref 36.0–46.0)
Hemoglobin: 9.2 g/dL — ABNORMAL LOW (ref 12.0–15.0)
MCH: 32.5 pg (ref 26.0–34.0)
MCHC: 31.7 g/dL (ref 30.0–36.0)
MCV: 102.5 fL — ABNORMAL HIGH (ref 78.0–100.0)
Platelets: 190 10*3/uL (ref 150–400)
RBC: 2.83 MIL/uL — AB (ref 3.87–5.11)
RDW: 17.7 % — ABNORMAL HIGH (ref 11.5–15.5)
WBC: 14.9 10*3/uL — ABNORMAL HIGH (ref 4.0–10.5)

## 2017-04-01 LAB — COMPREHENSIVE METABOLIC PANEL
ALBUMIN: 2.9 g/dL — AB (ref 3.5–5.0)
ALK PHOS: 110 U/L (ref 38–126)
ALT: 15 U/L (ref 14–54)
ANION GAP: 14 (ref 5–15)
AST: 23 U/L (ref 15–41)
BILIRUBIN TOTAL: 1.5 mg/dL — AB (ref 0.3–1.2)
BUN: 25 mg/dL — AB (ref 6–20)
CO2: 21 mmol/L — ABNORMAL LOW (ref 22–32)
Calcium: 8.8 mg/dL — ABNORMAL LOW (ref 8.9–10.3)
Chloride: 99 mmol/L — ABNORMAL LOW (ref 101–111)
Creatinine, Ser: 1.1 mg/dL — ABNORMAL HIGH (ref 0.44–1.00)
GFR calc Af Amer: 50 mL/min — ABNORMAL LOW (ref >60)
GFR calc non Af Amer: 43 mL/min — ABNORMAL LOW (ref >60)
GLUCOSE: 187 mg/dL — AB (ref 65–99)
Potassium: 3.9 mmol/L (ref 3.5–5.1)
Sodium: 134 mmol/L — ABNORMAL LOW (ref 135–145)
TOTAL PROTEIN: 5.7 g/dL — AB (ref 6.5–8.1)

## 2017-04-01 LAB — MRSA PCR SCREENING: MRSA by PCR: NEGATIVE

## 2017-04-01 LAB — LACTIC ACID, PLASMA
Lactic Acid, Venous: 1.9 mmol/L (ref 0.5–1.9)
Lactic Acid, Venous: 1.6 mmol/L (ref 0.5–1.9)

## 2017-04-01 LAB — GLUCOSE, CAPILLARY
GLUCOSE-CAPILLARY: 167 mg/dL — AB (ref 65–99)
Glucose-Capillary: 201 mg/dL — ABNORMAL HIGH (ref 65–99)
Glucose-Capillary: 176 mg/dL — ABNORMAL HIGH (ref 65–99)
Glucose-Capillary: 222 mg/dL — ABNORMAL HIGH (ref 65–99)
Glucose-Capillary: 194 mg/dL — ABNORMAL HIGH (ref 65–99)
Glucose-Capillary: 219 mg/dL — ABNORMAL HIGH (ref 65–99)

## 2017-04-01 LAB — TROPONIN I
Troponin I: <0.03 ng/mL (ref <0.03)
Troponin I: 0.03 ng/mL (ref <0.03)

## 2017-04-01 LAB — PROCALCITONIN: PROCALCITONIN: 0.24 ng/mL

## 2017-04-01 MED ORDER — DEXTROSE 5 % IV SOLN
2.0000 g | INTRAVENOUS | Status: DC
  Administered 2017-04-02 – 2017-04-04 (×3): 2 g via INTRAVENOUS
  Filled 2017-04-01 (×3): qty 2

## 2017-04-01 MED ORDER — SENNA 8.6 MG PO TABS: 1.0000 | ORAL_TABLET | Freq: Every day | ORAL | Status: DC | PRN

## 2017-04-01 MED ORDER — SODIUM CHLORIDE 0.9 % IV SOLN: 250.0000 mL | INTRAVENOUS | Status: DC | PRN

## 2017-04-01 MED ORDER — TIZANIDINE HCL 4 MG PO TABS
4.0000 mg | ORAL_TABLET | Freq: Three times a day (TID) | ORAL | Status: DC | PRN
  Administered 2017-04-01: 4 mg via ORAL
  Filled 2017-04-01: qty 1

## 2017-04-01 MED ORDER — GABAPENTIN 300 MG PO CAPS
300.0000 mg | ORAL_CAPSULE | Freq: Every day | ORAL | Status: DC
  Administered 2017-04-01 – 2017-04-06 (×6): 300 mg via ORAL
  Filled 2017-04-01 (×6): qty 1

## 2017-04-01 MED ORDER — ACETAMINOPHEN 650 MG RE SUPP: 650.0000 mg | Freq: Four times a day (QID) | RECTAL | Status: DC | PRN

## 2017-04-01 MED ORDER — LACTATED RINGERS IV SOLN
INTRAVENOUS | Status: DC
  Administered 2017-04-01 – 2017-04-03 (×4): via INTRAVENOUS

## 2017-04-01 MED ORDER — APIXABAN 2.5 MG PO TABS
2.5000 mg | ORAL_TABLET | Freq: Two times a day (BID) | ORAL | Status: DC
  Administered 2017-04-02: 2.5 mg via ORAL
  Filled 2017-04-01: qty 1

## 2017-04-01 MED ORDER — ACETAMINOPHEN 325 MG PO TABS
650.0000 mg | ORAL_TABLET | Freq: Four times a day (QID) | ORAL | Status: DC | PRN
  Administered 2017-04-02 – 2017-04-03 (×2): 650 mg via ORAL
  Filled 2017-04-01 (×2): qty 2

## 2017-04-01 MED ORDER — FERROUS SULFATE 325 (65 FE) MG PO TABS
325.0000 mg | ORAL_TABLET | Freq: Two times a day (BID) | ORAL | Status: DC
  Administered 2017-04-01 – 2017-04-06 (×10): 325 mg via ORAL
  Filled 2017-04-01 (×10): qty 1

## 2017-04-01 MED ORDER — SODIUM CHLORIDE 0.9% FLUSH
3.0000 mL | Freq: Two times a day (BID) | INTRAVENOUS | Status: DC
  Administered 2017-04-01 – 2017-04-02 (×4): 3 mL via INTRAVENOUS

## 2017-04-01 MED ORDER — SODIUM CHLORIDE 0.9 % IV BOLUS (SEPSIS)
500.0000 mL | Freq: Once | INTRAVENOUS | Status: AC
  Administered 2017-04-01: 500 mL via INTRAVENOUS

## 2017-04-01 MED ORDER — INSULIN ASPART 100 UNIT/ML ~~LOC~~ SOLN
0.0000 [IU] | Freq: Three times a day (TID) | SUBCUTANEOUS | Status: DC
  Administered 2017-04-01 (×3): 2 [IU] via SUBCUTANEOUS
  Administered 2017-04-02: 1 [IU] via SUBCUTANEOUS
  Administered 2017-04-02: 2 [IU] via SUBCUTANEOUS
  Administered 2017-04-03: 3 [IU] via SUBCUTANEOUS

## 2017-04-01 MED ORDER — LINAGLIPTIN 5 MG PO TABS
5.0000 mg | ORAL_TABLET | Freq: Every day | ORAL | Status: DC
  Administered 2017-04-01 – 2017-04-06 (×6): 5 mg via ORAL
  Filled 2017-04-01 (×6): qty 1

## 2017-04-01 MED ORDER — MIRTAZAPINE 15 MG PO TABS
15.0000 mg | ORAL_TABLET | Freq: Every day | ORAL | Status: DC
  Administered 2017-04-01 – 2017-04-05 (×4): 15 mg via ORAL
  Filled 2017-04-01 (×4): qty 1

## 2017-04-01 MED ORDER — MUPIROCIN 2 % EX OINT
1.0000 "application " | TOPICAL_OINTMENT | Freq: Two times a day (BID) | CUTANEOUS | Status: DC
  Administered 2017-04-01 – 2017-04-04 (×8): 1 via TOPICAL
  Filled 2017-04-01 (×2): qty 22

## 2017-04-01 MED ORDER — GLIMEPIRIDE 4 MG PO TABS
2.0000 mg | ORAL_TABLET | Freq: Every day | ORAL | Status: DC
  Administered 2017-04-01 – 2017-04-06 (×6): 2 mg via ORAL
  Filled 2017-04-01 (×7): qty 1

## 2017-04-01 MED ORDER — GABAPENTIN 300 MG PO CAPS: 300.0000 mg | ORAL_CAPSULE | Freq: Three times a day (TID) | ORAL | Status: DC

## 2017-04-01 MED ORDER — SODIUM CHLORIDE 0.9% FLUSH: 3.0000 mL | INTRAVENOUS | Status: DC | PRN

## 2017-04-01 MED ORDER — NALOXONE HCL 0.4 MG/ML IJ SOLN
INTRAMUSCULAR | Status: AC
Start: 2017-04-01 — End: 2017-04-01
  Administered 2017-04-01: 17:00:00
  Filled 2017-04-01: qty 1

## 2017-04-01 MED ORDER — DEXTROSE 5 % IV SOLN
1.0000 g | INTRAVENOUS | Status: AC
  Administered 2017-04-01: 1 g via INTRAVENOUS
  Filled 2017-04-01 (×2): qty 1

## 2017-04-01 MED ORDER — HYDROMORPHONE HCL 1 MG/ML IJ SOLN
0.5000 mg | INTRAMUSCULAR | Status: DC | PRN
  Administered 2017-04-01: 0.5 mg via INTRAVENOUS
  Filled 2017-04-01: qty 1

## 2017-04-01 MED ORDER — METOPROLOL TARTRATE 12.5 MG HALF TABLET
12.5000 mg | ORAL_TABLET | Freq: Two times a day (BID) | ORAL | Status: DC
  Administered 2017-04-01 (×2): 12.5 mg via ORAL
  Filled 2017-04-01 (×2): qty 1

## 2017-04-01 MED ORDER — METOPROLOL TARTRATE 25 MG PO TABS
25.0000 mg | ORAL_TABLET | Freq: Two times a day (BID) | ORAL | Status: DC
  Filled 2017-04-01: qty 1

## 2017-04-01 MED ORDER — FAMOTIDINE 20 MG PO TABS
10.0000 mg | ORAL_TABLET | Freq: Two times a day (BID) | ORAL | Status: DC
  Administered 2017-04-02 – 2017-04-06 (×9): 10 mg via ORAL
  Filled 2017-04-01 (×9): qty 1

## 2017-04-01 MED ORDER — NALOXONE HCL 0.4 MG/ML IJ SOLN
INTRAMUSCULAR | Status: AC
  Filled 2017-04-01: qty 1

## 2017-04-01 MED ORDER — DEXTROSE 5 % IV SOLN: 1.0000 g | Freq: Three times a day (TID) | INTRAVENOUS | Status: DC

## 2017-04-01 MED ORDER — APIXABAN 2.5 MG PO TABS
2.5000 mg | ORAL_TABLET | Freq: Two times a day (BID) | ORAL | Status: DC
  Administered 2017-04-01: 2.5 mg via ORAL
  Filled 2017-04-01: qty 1

## 2017-04-01 MED ORDER — MAGNESIUM SULFATE 2 GM/50ML IV SOLN
2.0000 g | Freq: Once | INTRAVENOUS | Status: AC
Start: 2017-04-01 — End: 2017-04-01
  Administered 2017-04-01: 2 g via INTRAVENOUS
  Filled 2017-04-01 (×2): qty 50

## 2017-04-01 MED ORDER — INSULIN ASPART 100 UNIT/ML ~~LOC~~ SOLN
0.0000 [IU] | Freq: Every day | SUBCUTANEOUS | Status: DC
Start: 2017-04-01 — End: 2017-04-03
  Administered 2017-04-01: 1 [IU] via SUBCUTANEOUS
  Administered 2017-04-01 – 2017-04-02 (×2): 2 [IU] via SUBCUTANEOUS

## 2017-04-01 MED ORDER — TRAMADOL HCL 50 MG PO TABS
50.0000 mg | ORAL_TABLET | Freq: Four times a day (QID) | ORAL | Status: DC | PRN
  Administered 2017-04-01 – 2017-04-03 (×4): 50 mg via ORAL
  Filled 2017-04-01 (×5): qty 1

## 2017-04-01 MED ORDER — NOREPINEPHRINE BITARTRATE 1 MG/ML IV SOLN
0.0000 ug/min | INTRAVENOUS | Status: DC
  Administered 2017-04-01: 5 ug/min via INTRAVENOUS
  Filled 2017-04-01 (×2): qty 4

## 2017-04-01 MED ORDER — METOPROLOL TARTRATE 5 MG/5ML IV SOLN: 2.5000 mg | Freq: Once | INTRAVENOUS | Status: DC

## 2017-04-01 MED ORDER — URSODIOL 300 MG PO CAPS
300.0000 mg | ORAL_CAPSULE | Freq: Every day | ORAL | Status: DC
  Administered 2017-04-01 – 2017-04-06 (×6): 300 mg via ORAL
  Filled 2017-04-01 (×7): qty 1

## 2017-04-01 MED ORDER — OXYBUTYNIN CHLORIDE 5 MG PO TABS
5.0000 mg | ORAL_TABLET | Freq: Three times a day (TID) | ORAL | Status: DC | PRN
  Filled 2017-04-01: qty 1

## 2017-04-01 MED ORDER — GABAPENTIN 300 MG PO CAPS
600.0000 mg | ORAL_CAPSULE | Freq: Every day | ORAL | Status: DC
  Administered 2017-04-01 – 2017-04-05 (×5): 600 mg via ORAL
  Filled 2017-04-01 (×5): qty 2

## 2017-04-01 NOTE — Consult Note (Signed)
PULMONARY / CRITICAL CARE MEDICINE   Name: [name] MRN: [id number] DOB: May 18, 1924    ADMISSION DATE:  03/31/2017 CONSULTATION DATE:  04/01/2017    CHIEF COMPLAINT: Hypotension  HISTORY OF PRESENT ILLNESS:        This is a 84 year old diabetic who suffered from a fall requiring ORIF of the right femur on 1/9 she fell again while in rehab and was complaining of leg pain and was admitted to the hospital.  No new fracture was found.  It should be noted that she is chronically on Xarelto for unknown reasons.  Rapid response was called today for hypotension.  It is very difficult to obtain any history from the patient but she denies cough dyspnea or chest pain.  She does have a positive urinalysis.  She has been given a liter of crystalloid and started on 10 mcg of levo fed and her blood pressure has responded with a rise from the 50s to the 1 teens.  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), CHF (congestive heart failure) ([place]), Chronic cystitis, Chronic steroid use, COPD (chronic obstructive pulmonary disease) ([place]), Depressive disorder, not elsewhere classified, Dermatophytosis of nail, Diabetic peripheral neuropathy ([place]), Diaphragmatic hernia without mention of obstruction or gangrene, Hemoperitoneum (nontraumatic), Herpes zoster with other nervous system complications(053.19), History of TIA (transient ischemic attack) (2012--  NO RESIDUAL), HTN (hypertension), 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), Insulin dependent diabetes mellitus ([name]), Macular degeneration of both eyes, Malignant neoplasm of renal pelvis ([place]), Osteoporosis, Osteoporosis, unspecified, Other and unspecified hyperlipidemia, Personality change due to conditions classified elsewhere, PONV (postoperative nausea and vomiting), Renal oncocytoma (RIGHT SIDE--  S/P CYROALBATION JAN 2014   POST-OP  ABSCESS), Temporal arteritis ([place]), Type I (juvenile type) diabetes mellitus without mention of complication, uncontrolled, Unspecified essential hypertension, Unspecified late effects of cerebrovascular disease, and Urinary frequency.  PAST SURGICAL HISTORY: She  has a past surgical history that includes Lumbar laminectomy (11-19-2003); Total hip arthroplasty (Left, 01-22-2005); Total knee arthroplasty (Bilateral, LEFT 08-08-2007;  RIGHT 05-08-2009); Anterior cervical decomp/discectomy fusion (10-12-2005); Posterior fusion cervical spine (02-10-2006); ORIF W/ PLATE RIGHT DISTAL RADIUS FX (06-18-2003); Cataract extraction w/ intraocular lens  implant, bilateral (Right, Oct 1992); CRYOABLATION RIGHT RENAL MASS (03-16-2012); Temporal artery biopsy / ligation; Elbow surgery (Bilateral, 2003  (APPROX)); Carpal tunnel release (Right, 2009  (APPROX)); Cystoscopy w/ ureteral stent placement (Right, 07/10/2012); Hip Arthroplasty (Right, 08/03/2012); Cataract extraction w/ intraocular lens implant (Left, 1993); and ORIF femur fracture (Right, 03/15/2017).  Allergies  Allergen Reactions  . Allegra [Fexofenadine Hcl]     Unknown reaction  . Levaquin [Levofloxacin In D5w] Other (See Comments)    Unknown reaction  . Tussionex Pennkinetic Er [Hydrocod Polst-Cpm Polst Er] Nausea And Vomiting  . Codeine Nausea And Vomiting  . Morphine And Related Nausea And Vomiting    No current facility-administered medications on file prior to encounter.    Current Outpatient Medications on File Prior to Encounter  Medication Sig  . acetaminophen (TYLENOL) 325 MG tablet Take 650 mg by mouth every 6 (six) hours as needed for fever or headache (pain).  Marland Kitchen apixaban (ELIQUIS) 5 MG TABS tablet Take 1 tablet (5 mg total) by mouth 2 (two) times daily.  Marland Kitchen dextromethorphan-guaiFENesin (DIABETIC TUSSIN DM) 10-100 MG/5ML liquid Take 15 mLs by mouth every 4 (four) hours as needed for cough.  . ferrous sulfate 325 (65 FE) MG tablet  Take 1 tablet (325  mg total) by mouth 2 (two) times daily with a meal.  . gabapentin (NEURONTIN) 300 MG capsule Take 1 capsule (300 mg total) by mouth 3 (three) times daily. Taking 1 tab in the am , and 2 tabs in the pm. (Patient taking differently: Take 300 mg by mouth 3 (three) times daily. )  . glimepiride (AMARYL) 2 MG tablet Take 2 mg by mouth daily with breakfast.  . linagliptin (TRADJENTA) 5 MG TABS tablet Take 1 tablet (5 mg total) by mouth daily.  . metoprolol tartrate (LOPRESSOR) 25 MG tablet Take 0.5 tablets (12.5 mg total) by mouth 2 (two) times daily.  . mirtazapine (REMERON) 15 MG tablet Take 15 mg by mouth at bedtime.  . mupirocin ointment (BACTROBAN) 2 % Place 1 application into the nose See admin instructions. Apply topically twice daily to open affected areas on back of neck until healed.  Marland Kitchen oxybutynin (DITROPAN) 5 MG tablet Take 1 tablet (5 mg total) by mouth 3 (three) times daily as needed for bladder spasms (Up to 3 times daily to control bladder spasms.).  Marland Kitchen ranitidine (ZANTAC) 150 MG tablet Take 150 mg by mouth daily.  Marland Kitchen senna (SENOKOT) 8.6 MG TABS tablet Take 1 tablet by mouth daily as needed (constipation).  Marland Kitchen tiZANidine (ZANAFLEX) 4 MG tablet Take 4 mg by mouth 3 (three) times daily as needed for muscle spasms.  . traMADol (ULTRAM) 50 MG tablet Take 50 mg by mouth every 6 (six) hours as needed (pain).  . ursodiol (ACTIGALL) 300 MG capsule Take 150 mg by mouth daily.   . feeding supplement, GLUCERNA SHAKE, (GLUCERNA SHAKE) LIQD Take 237 mLs by mouth 2 (two) times daily between meals. (Patient not taking: Reported on 03/31/2017)  . omeprazole (PRILOSEC) 20 MG capsule Take 1 capsule (20 mg total) by mouth daily. (Patient not taking: Reported on 03/31/2017)  . oxyCODONE-acetaminophen (PERCOCET/ROXICET) 5-325 MG tablet Take 2 tablets by mouth every 4 (four) hours as needed for moderate pain. (Patient not taking: Reported on 03/31/2017)  . predniSONE (DELTASONE) 1 MG tablet Take 3  tablets (3 mg total) by mouth daily with breakfast. (Patient not taking: Reported on 03/31/2017)    FAMILY HISTORY:  Her indicated that her mother is deceased. She indicated that her father is deceased. She indicated that both of her sisters are deceased. She indicated that her brother is deceased. She indicated that two of her three daughters are alive. She indicated that her son is alive. She indicated that the status of her unknown relative is unknown.   SOCIAL HISTORY: She  reports that she quit smoking about 34 years ago. Her smoking use included cigarettes. She has a 60.00 pack-year smoking history. she has never used smokeless tobacco. She reports that she does not drink alcohol or use drugs.  REVIEW OF SYSTEMS:   Not obtainable  SUBJECTIVE:  Not Obtainable  VITAL SIGNS: BP (!) 117/59   Pulse (!) 51   Temp (!) 97.1 F (36.2 C) (Oral)   Resp 16   Wt 144 lb (65.3 kg)   SpO2 100%   BMI 27.22 kg/m   HEMODYNAMICS:    VENTILATOR SETTINGS:    INTAKE / OUTPUT: No intake/output data recorded.  PHYSICAL EXAMINATION: General: Elderly white female who is lethargic but becomes very combative when stimulated. Neuro:'s are equal face is symmetric and she appears to move all fours.  Speech is largely unintelligible. Cardiovascular: S1 and S2 are regular without murmur rub or gallop PMI is unremarkable.  The periphery is  pink and warm. Lungs: Respirations are unlabored, the lungs are clear to supine exam Abdomen: The abdomen is soft without any organomegaly masses tenderness guarding or rebound.  She is anicteric Musculoskeletal: She pushes me away when I attempt to examine her hips and legs.   LABS:  BMET Recent Labs  Lab 03/31/17 1604 04/01/17 0608  NA 135 134*  K 4.2 3.9  CL 100* 99*  CO2 23 21*  BUN 29* 25*  CREATININE 1.08* 1.10*  GLUCOSE 152* 187*    Electrolytes Recent Labs  Lab 03/31/17 1604 04/01/17 0608  CALCIUM 8.8* 8.8*    CBC Recent Labs  Lab  03/31/17 1604 04/01/17 0608  WBC 11.2* 14.9*  HGB 10.0* 9.2*  HCT 31.7* 29.0*  PLT 182 190    Coag's Recent Labs  Lab 03/31/17 1604  INR 1.95    Sepsis Markers No results for input(s): LATICACIDVEN, PROCALCITON, O2SATVEN in the last 168 hours.  ABG No results for input(s): PHART, PCO2ART, PO2ART in the last 168 hours.  Liver Enzymes Recent Labs  Lab 03/31/17 1604 04/01/17 0608  AST 35 23  ALT 18 15  ALKPHOS 122 110  BILITOT 1.6* 1.5*  ALBUMIN 3.1* 2.9*    Cardiac Enzymes Recent Labs  Lab 04/01/17 0104 04/01/17 0608 04/01/17 1114  TROPONINI <0.03 <0.03 0.03*    Glucose Recent Labs  Lab 04/01/17 0026 04/01/17 0646 04/01/17 1118 04/01/17 1650  GLUCAP 201* 176* 167* 222*    Imaging Dg Chest 1 View  Result Date: 03/31/2017 CLINICAL DATA:  Fall EXAM: CHEST 1 VIEW COMPARISON:  03/14/2017 FINDINGS: Postsurgical changes in the cervical spine. Mild atelectasis at the left base. No focal consolidation. No pneumothorax. Mild cardiomegaly with aortic atherosclerosis. Treated vertebral compression fracture in the midthoracic spine. IMPRESSION: 1. Minimal left basilar atelectasis. 2. Stable mild cardiomegaly. Electronically Signed   By: Donavan Foil M.D.   On: 03/31/2017 18:18   Dg Pelvis 1-2 Views  Result Date: 03/31/2017 CLINICAL DATA:  Fall EXAM: PELVIS - 1-2 VIEW COMPARISON:  CT abdomen pelvis 09/11/2016 FINDINGS: Status post bilateral hip replacements with normal alignment. No acute displaced fracture is seen. The SI joints do not appear widened. Pubic symphysis and rami are intact. IMPRESSION: Postsurgical changes of both hips. No definite acute osseous abnormality Electronically Signed   By: Donavan Foil M.D.   On: 03/31/2017 18:16   Dg Femur Min 2 Views Left  Result Date: 03/31/2017 CLINICAL DATA:  Bilateral leg pain EXAM: LEFT FEMUR 2 VIEWS COMPARISON:  03/14/2017 FINDINGS: No acute displaced fracture is seen. Patient is status post left hip replacement  with normal alignment. Status post left knee replacement with normal alignment. Moderate to large knee effusion with meniscus level. Vascular calcifications. IMPRESSION: 1. Status post left hip and knee replacement with normal alignment 2. Moderate to large knee effusion with meniscus level but no definitive fracture seen. Electronically Signed   By: Donavan Foil M.D.   On: 03/31/2017 18:14   Dg Femur Min 2 Views Right  Result Date: 03/31/2017 CLINICAL DATA:  Status post fall today.  Initial encounter. EXAM: RIGHT FEMUR 2 VIEWS COMPARISON:  Plain films of the right femur 03/15/2017, 03/13/2017 and 03/17/2017. FINDINGS: Plate and screw fixation of a comminuted diaphyseal fracture appears unchanged since the most recent exams. The patient is status post right hip and knee replacement. No acute abnormality is identified. IMPRESSION: No acute finding. Status post fixation of a comminuted right femur fracture. Status post right hip and knee replacement. Electronically Signed  By: Inge Rise M.D.   On: 03/31/2017 18:07      DISCUSSION:      This is a 84 year old diabetic with apparent delirium/dementia who has suffered from a right femur fracture on 1/9 and subsequently fell at rehab and was complaining of right leg pain.  No new fracture was found.  A rapid response was called today for hypotension.  ASSESSMENT / PLAN:  PULMONARY A: The patient is on therapeutic Xarelto and I think pulmonary embolism is not a consideration as the revocation for her hypotension despite the clinical setting.   CARDIOVASCULAR A: Hypotension.  I think the major considerations here are hypovolemia and urinary tract infection.  I am concerned that she is on Xarelto and is suffered from a fall and she has the potential for substantial bleeding into large potential spaces.  We are fluid resuscitating the patient and I have ordered serial H&H's.  She also has a urinary tract infection and she has been treated with  cefepime.  Serial lactates have been ordered and levo fed titrated as needed.  There are no acute ischemic changes on her EKG, the EKG in fact is only remarkable for sinus rhythm, and low voltage, without any electrical alternans.  Serial troponins have been ordered   ENDOCRINE A: I have not changed her current diabetic regimen    NEUROLOGIC A: Dementia /delirium.  Is on a number of agents that may be exacerbating the problem including tramadol and at bedtime benzodiazepines both of which I have discontinued.  Probably the best strategy for controlling this is to keep family in the room and keep the surroundings as familiar as possible.  Have ordered a small dose of fentanyl for pain control  32 minutes was spent in the care of this patient today  Lars Masson, MD Pulmonary and [place] Pager: [phone]  04/01/2017, 5:34 PM

## 2017-04-01 NOTE — Progress Notes (Signed)
Patient ID: [name], female   DOB: [birth year], 84 y.o.   MRN: [id number]     Subjective:  Patient reports pain as mild to moderate.  Patient complains of bilateral knee and right hip pain no ankle or left hip pain.   Objective:   VITALS:   Vitals:   04/01/17 0000 04/01/17 0044 04/01/17 0551 04/01/17 0842  BP:  106/90 133/90   Pulse:  (!) 115 (!) 125 (!) 113  Resp:  20 (!) 21   Temp:  97.9 F (36.6 C)    TempSrc:  Axillary    SpO2:  90% 95%   Weight: 65.3 kg (144 lb)       ABD soft Sensation intact distally Intact pulses distally Dorsiflexion/Plantar flexion intact Incision: dressing C/D/I and no drainage Right lower ext incisions look good no drainage Tender to palpation right hip and knee stable to varus/valgus stress Left knee pain with motion and palpation stable to varus/valgus stress Left knee is swollen   Lab Results  Component Value Date   WBC 14.9 (H) 04/01/2017   HGB 9.2 (L) 04/01/2017   HCT 29.0 (L) 04/01/2017   MCV 102.5 (H) 04/01/2017   PLT 190 04/01/2017   BMET    Component Value Date/Time   NA 134 (L) 04/01/2017 0608   NA 140 09/04/2013 1508   K 3.9 04/01/2017 0608   CL 99 (L) 04/01/2017 0608   CO2 21 (L) 04/01/2017 0608   GLUCOSE 187 (H) 04/01/2017 0608   BUN 25 (H) 04/01/2017 0608   BUN 35 (H) 09/04/2013 1508   CREATININE 1.10 (H) 04/01/2017 0608   CREATININE 1.24 (H) 11/05/2013 1400   CALCIUM 8.8 (L) 04/01/2017 0608   GFRNONAA 43 (L) 04/01/2017 6962   GFRNONAA 39 (L) 11/05/2013 1400   GFRAA 50 (L) 04/01/2017 9528   GFRAA 45 (L) 11/05/2013 1400     Assessment/Plan:     Principal Problem:   Leg pain Active Problems:   Anemia   Chronic kidney disease, stage III (moderate) (HCC)   Atrial fibrillation (HCC)   Left leg pain   Advance diet Up with therapy WBAT on the left and non weight bearing on the right. Patient may be to painful to bear weight on left lower due to fall but imaging is negative Okay for anticoagulation  per medicine. Will continue to follow tomorrow     Remonia Richter 04/01/2017, 11:24 AM  Discussed, reviewed films, and agree with above.   Marchia Bond, MD Cell [phone]

## 2017-04-01 NOTE — Significant Event (Signed)
Rapid Response Event Note  Overview:  Called by RN for patient very lethargic Time Called: 1627 Arrival Time: 1630 Event Type: Cardiac  Initial Focused Assessment:  Called by RN for patient with sudden increase in lethargy.  On my arrival to patients room RN and family at bedside.  She received 0.5mg  IV dilaudid at 1245, and at 1552 4 mg zanaflex and 50mg  tramadol.  Patietn was obtunded and unresponsive to painful stimuli with 54/29, HR 80's on nasal cannula 4 lpm, sats unobtainable.  Pain cool and pale.   Interventions:  NS bolus started and 0.4mg  IV narcan given as per protocol.  Patient had some response to narcan.  Dr. Cruzita Lederer  At bedside, order for another 0.4mg  IV narcan in which patient responded to.  Was talking and swinging arms.  BP still low 82/59, 62/28, 50/20. Levophed drip started at 5 mcg increased at 1710 to 10 mcg BP 1716 117/59, HR 77 sats 100% on 4 lpm nasal cannula.  ABVG drawn by RRT.  PIV inserted by IV team.  CCM at bedside.    Plan of Care (if not transferred):  PAtietn to transfer to ICU.  Family updated by MD  Event Summary:  RRT report given to RN in ICU.     at      at          [facility], Harlin Rain

## 2017-04-01 NOTE — Progress Notes (Addendum)
Cold by the nurse in the afternoon that he cannot get a pulse reading on her and she seems to be more altered than she was earlier in the day.  Patient seen and evaluated at bedside, on my arrival she was hypotensive with a systolic blood pressure in the 50s, she was pale and poorly responsive.  She had diminished breath sounds.  Heart rate was in the 70s-80s.  Rapid response was called.  Stat EKG done at bedside showed atrial fibrillation with controlled rate.  I discussed with family at bedside, she is DNR however they would want vasopressors if needed. She initially received wide open IV fluids, and subsequently required IV vasopressors due to persistent hypotension associated with decreased mentation.  Critical care was consulted and patient was transferred to ICU.  She appears to be in shock, unclear etiology at this point.  She was also has received tramadol and Zanaflex earlier on, however she seems to have tolerated tramadol well in the past so unlikely medication side effect.  Urinalysis resulted couple hours ago and she does have evidence of a urinary tract infection, will place on broad-spectrum antibiotics for now.  She seems to be able to protect her airway.  She is unlikely to have a PE as she already is anticoagulated on Eliquis.  She will be closely monitored in the ICU  Critical care time 60 minutes, 4:40 PM to 5:40 PM  Piedad Standiford M. Cruzita Lederer, MD [facility] [phone]  If 7PM-7AM, please contact night-coverage [email or web address] Password [email or web address]

## 2017-04-01 NOTE — Progress Notes (Signed)
PROGRESS NOTE  [name] MVH:[id number] DOB: 12/10/1924 DOA: 03/31/2017 PCP: Patient, No Pcp Per   LOS: 0 days   Brief Narrative / Interim history: 84 year old female with history of diabetes, hypertension, hyperlipidemia, temporal arteritis, COPD, osteoporosis, who was in a skilled nursing facility and presents to the hospital with a fall and inability to bear weight.  Patient was recently hospitalized and operated by Dr. Doreatha Martin for right femur periprosthetic fracture between her right total knee arthroplasty and right hip hemiarthroplasty, surgery was done on 03/15/2017.  She also has a history of left knee TKA in the past.  Assessment & Plan: Principal Problem:   Leg pain Active Problems:   Anemia   Chronic kidney disease, stage III (moderate) (HCC)   Atrial fibrillation (HCC)   Left leg pain   Left leg pain -Patient somewhat confused this morning, unable to tell me if she hurts on the left than on the right.  It is also unclear on which side she felt -X-rays look unremarkable other than moderate large effusion on the left knee -Discussed with Dr. Mardelle Matte with orthopedic surgery, will consult, appreciate input -Symptomatic management for now pain control, PT evaluation  Chronic kidney disease stage III -Creatinine appears at baseline  Leukocytosis -Likely reactive, no source of infection  Anemia of chronic disease/iron deficiency -No evidence of bleeding, hemoglobin stable -Continue iron supplementation  Paroxysmal A. fib -Continue metoprolol for rate control, increased dose today for rates in the low 100s -She is on Eliquis for anticoagulation, hold for now to make sure she does not need any procedures, resume as soon as cleared by orthopedic surgery  Type 2 diabetes mellitus -Continue Tradjenta, Amaryl, continue sliding scale insulin -Continue Neurontin for neuropathic pain   DVT prophylaxis: Eliquis (now on hold until orthopedics to evaluate patient Code Status:  Full code Family Communication: No family at bedside Disposition Plan: Return to SNF when stable  Consultants:   Orthopedic surgery  Procedures:   None  Antimicrobials:  None   Subjective: -She appears somewhat confused, cannot tell me why she is in the hospital, currently denies any pain  Objective: Vitals:   04/01/17 0000 04/01/17 0044 04/01/17 0551 04/01/17 0842  BP:  106/90 133/90   Pulse:  (!) 115 (!) 125 (!) 113  Resp:  20 (!) 21   Temp:  97.9 F (36.6 C)    TempSrc:  Axillary    SpO2:  90% 95%   Weight: 65.3 kg (144 lb)      No intake or output data in the 24 hours ending 04/01/17 1110 Filed Weights   04/01/17 0000  Weight: 65.3 kg (144 lb)    Examination:  Constitutional: NAD Eyes: lids and conjunctivae normal ENMT: Mucous membranes are moist.  Respiratory: clear to auscultation bilaterally, no wheezing, no crackles. Normal respiratory effort.  Cardiovascular: Irregular, 2 out of 6 SEM. No LE edema. 2+ pedal pulses. Abdomen: no tenderness. Bowel sounds positive.  Musculoskeletal: no clubbing / cyanosis.  Left knee is swollen, appears very tender to palpation, no erythema, no warmth Skin: no rashes.  Surgical sites on right hip and above right knee appears to be healing well Neurologic: CN 2-12 grossly intact. Strength 5/5 in all 4.    Data Reviewed: I have independently reviewed following labs and imaging studies   CBC: Recent Labs  Lab 03/31/17 1604 04/01/17 0608  WBC 11.2* 14.9*  NEUTROABS 7.9*  --   HGB 10.0* 9.2*  HCT 31.7* 29.0*  MCV 102.3* 102.5*  PLT  182 132   Basic Metabolic Panel: Recent Labs  Lab 03/31/17 1604 04/01/17 0608  NA 135 134*  K 4.2 3.9  CL 100* 99*  CO2 23 21*  GLUCOSE 152* 187*  BUN 29* 25*  CREATININE 1.08* 1.10*  CALCIUM 8.8* 8.8*   GFR: Estimated Creatinine Clearance: 29.4 mL/min (A) (by C-G formula based on SCr of 1.1 mg/dL (H)). Liver Function Tests: Recent Labs  Lab 03/31/17 1604 04/01/17 0608    AST 35 23  ALT 18 15  ALKPHOS 122 110  BILITOT 1.6* 1.5*  PROT 5.9* 5.7*  ALBUMIN 3.1* 2.9*   No results for input(s): LIPASE, AMYLASE in the last 168 hours. No results for input(s): AMMONIA in the last 168 hours. Coagulation Profile: Recent Labs  Lab 03/31/17 1604  INR 1.95   Cardiac Enzymes: Recent Labs  Lab 04/01/17 0104 04/01/17 0608  TROPONINI <0.03 <0.03   BNP (last 3 results) No results for input(s): PROBNP in the last 8760 hours. HbA1C: No results for input(s): HGBA1C in the last 72 hours. CBG: Recent Labs  Lab 04/01/17 0026 04/01/17 0646  GLUCAP 201* 176*   Lipid Profile: No results for input(s): CHOL, HDL, LDLCALC, TRIG, CHOLHDL, LDLDIRECT in the last 72 hours. Thyroid Function Tests: No results for input(s): TSH, T4TOTAL, FREET4, T3FREE, THYROIDAB in the last 72 hours. Anemia Panel: No results for input(s): VITAMINB12, FOLATE, FERRITIN, TIBC, IRON, RETICCTPCT in the last 72 hours. Urine analysis:    Component Value Date/Time   COLORURINE YELLOW 03/14/2017 1024   APPEARANCEUR CLEAR 03/14/2017 1024   APPEARANCEUR Clear 05/07/2013 1436   LABSPEC 1.019 03/14/2017 1024   PHURINE 5.0 03/14/2017 1024   GLUCOSEU 50 (A) 03/14/2017 1024   HGBUR SMALL (A) 03/14/2017 1024   BILIRUBINUR NEGATIVE 03/14/2017 1024   BILIRUBINUR Neg 12/11/2013 1345   BILIRUBINUR Negative 05/07/2013 1436   KETONESUR 20 (A) 03/14/2017 1024   PROTEINUR NEGATIVE 03/14/2017 1024   UROBILINOGEN 0.2 12/11/2013 1345   UROBILINOGEN 1.0 11/16/2013 1256   NITRITE NEGATIVE 03/14/2017 1024   LEUKOCYTESUR TRACE (A) 03/14/2017 1024   LEUKOCYTESUR Negative 05/07/2013 1436   Sepsis Labs: Invalid input(s): PROCALCITONIN, LACTICIDVEN  Recent Results (from the past 240 hour(s))  MRSA PCR Screening     Status: None   Collection Time: 04/01/17  6:06 AM  Result Value Ref Range Status   MRSA by PCR NEGATIVE NEGATIVE Final    Comment:        The GeneXpert MRSA Assay (FDA approved for NASAL  specimens only), is one component of a comprehensive MRSA colonization surveillance program. It is not intended to diagnose MRSA infection nor to guide or monitor treatment for MRSA infections.       Radiology Studies: Dg Chest 1 View  Result Date: 03/31/2017 CLINICAL DATA:  Fall EXAM: CHEST 1 VIEW COMPARISON:  03/14/2017 FINDINGS: Postsurgical changes in the cervical spine. Mild atelectasis at the left base. No focal consolidation. No pneumothorax. Mild cardiomegaly with aortic atherosclerosis. Treated vertebral compression fracture in the midthoracic spine. IMPRESSION: 1. Minimal left basilar atelectasis. 2. Stable mild cardiomegaly. Electronically Signed   By: Donavan Foil M.D.   On: 03/31/2017 18:18   Dg Pelvis 1-2 Views  Result Date: 03/31/2017 CLINICAL DATA:  Fall EXAM: PELVIS - 1-2 VIEW COMPARISON:  CT abdomen pelvis 09/11/2016 FINDINGS: Status post bilateral hip replacements with normal alignment. No acute displaced fracture is seen. The SI joints do not appear widened. Pubic symphysis and rami are intact. IMPRESSION: Postsurgical changes of both hips. No definite  acute osseous abnormality Electronically Signed   By: Donavan Foil M.D.   On: 03/31/2017 18:16   Ct Head Wo Contrast  Result Date: 03/31/2017 CLINICAL DATA:  Altered level of consciousness, unexplained. Confusion EXAM: CT HEAD WITHOUT CONTRAST TECHNIQUE: Contiguous axial images were obtained from the base of the skull through the vertex without intravenous contrast. COMPARISON:  03/13/2017 FINDINGS: Brain: No evidence of acute infarction, hemorrhage, hydrocephalus, extra-axial collection or mass lesion/mass effect. Mild chronic small vessel ischemia in the periventricular white matter. Generalized volume loss, age congruent. Vascular: Atherosclerotic calcification.  No hyperdense vessel. Skull: Negative Sinuses/Orbits: Chronic left maxillary sinusitis with complete opacification. This has been seen since at least 2012.  bilateral cataract resection. IMPRESSION: 1. Senescent changes without acute finding. 2. Chronic left maxillary sinusitis. Electronically Signed   By: Monte Fantasia M.D.   On: 03/31/2017 16:38   Dg Femur Min 2 Views Left  Result Date: 03/31/2017 CLINICAL DATA:  Bilateral leg pain EXAM: LEFT FEMUR 2 VIEWS COMPARISON:  03/14/2017 FINDINGS: No acute displaced fracture is seen. Patient is status post left hip replacement with normal alignment. Status post left knee replacement with normal alignment. Moderate to large knee effusion with meniscus level. Vascular calcifications. IMPRESSION: 1. Status post left hip and knee replacement with normal alignment 2. Moderate to large knee effusion with meniscus level but no definitive fracture seen. Electronically Signed   By: Donavan Foil M.D.   On: 03/31/2017 18:14   Dg Femur Min 2 Views Right  Result Date: 03/31/2017 CLINICAL DATA:  Status post fall today.  Initial encounter. EXAM: RIGHT FEMUR 2 VIEWS COMPARISON:  Plain films of the right femur 03/15/2017, 03/13/2017 and 03/17/2017. FINDINGS: Plate and screw fixation of a comminuted diaphyseal fracture appears unchanged since the most recent exams. The patient is status post right hip and knee replacement. No acute abnormality is identified. IMPRESSION: No acute finding. Status post fixation of a comminuted right femur fracture. Status post right hip and knee replacement. Electronically Signed   By: Inge Rise M.D.   On: 03/31/2017 18:07     Scheduled Meds: . [START ON 04/02/2017] famotidine  10 mg Oral BID  . ferrous sulfate  325 mg Oral BID WC  . gabapentin  300 mg Oral Daily   And  . gabapentin  600 mg Oral QHS  . glimepiride  2 mg Oral Q breakfast  . insulin aspart  0-5 Units Subcutaneous QHS  . insulin aspart  0-9 Units Subcutaneous TID WC  . linagliptin  5 mg Oral Daily  . metoprolol tartrate  2.5 mg Intravenous Once  . metoprolol tartrate  12.5 mg Oral BID  . mirtazapine  15 mg Oral QHS    . mupirocin ointment  1 application Topical BID  . sodium chloride flush  3 mL Intravenous Q12H  . ursodiol  300 mg Oral Daily   Continuous Infusions: . sodium chloride       Marzetta Board, MD, PhD [facility] Pager [phone]  If 7PM-7AM, please contact night-coverage [email or web address] Password [email or web address] 04/01/2017, 11:10 AM

## 2017-04-01 NOTE — Progress Notes (Signed)
Spoken with tried hospitalists regarding possible left lower extremity pain.  It is not clear actually where she is having pain, apparently because of advanced dementia.  She was recently readmitted, after having had a right periprosthetic femur fracture ORIF done by Dr. Doreatha Martin, and then had a fall at her skilled nursing facility.  I reviewed all of the imaging studies, which demonstrate no loss of position of the right periprosthetic femur fracture, the left hip has a total hip arthroplasty which appears stable, as well as a left total knee arthroplasty which also appears stable.  I do not see radiographic evidence for loss of integrity of the left lower extremity.  My assistant will be in later this morning to evaluate her, and I have ordered physical therapy to begin tomorrow, with nonweightbearing on the right lower extremity and weightbearing as tolerated on the left lower extremity unless further evaluation reveals something of more clinical consequence between now and then.  Johnny Bridge, MD

## 2017-04-01 NOTE — Discharge Instructions (Addendum)
Information on my medicine - ELIQUIS (apixaban)  This medication education was reviewed with me or my healthcare representative as part of my discharge preparation.  The pharmacist that spoke with me during my hospital stay was:  Saundra Shelling, [facility]  Why was Eliquis prescribed for you? Eliquis was prescribed for you to reduce the risk of a blood clot forming that can cause a stroke if you have a medical condition called atrial fibrillation (a type of irregular heartbeat).  What do You need to know about Eliquis ? Take your Eliquis TWICE DAILY - one tablet in the morning and one tablet in the evening with or without food. If you have difficulty swallowing the tablet whole please discuss with your pharmacist how to take the medication safely.  Take Eliquis exactly as prescribed by your doctor and DO NOT stop taking Eliquis without talking to the doctor who prescribed the medication.  Stopping may increase your risk of developing a stroke.  Refill your prescription before you run out.  After discharge, you should have regular check-up appointments with your healthcare provider that is prescribing your Eliquis.  In the future your dose may need to be changed if your kidney function or weight changes by a significant amount or as you get older.  What do you do if you miss a dose? If you miss a dose, take it as soon as you remember on the same day and resume taking twice daily.  Do not take more than one dose of ELIQUIS at the same time to make up a missed dose.  Important Safety Information A possible side effect of Eliquis is bleeding. You should call your healthcare provider right away if you experience any of the following: ? Bleeding from an injury or your nose that does not stop. ? Unusual colored urine (red or dark brown) or unusual colored stools (red or black). ? Unusual bruising for unknown reasons. ? A serious fall or if you hit your head (even if there is no bleeding).  Some  medicines may interact with Eliquis and might increase your risk of bleeding or clotting while on Eliquis. To help avoid this, consult your healthcare provider or pharmacist prior to using any new prescription or non-prescription medications, including herbals, vitamins, non-steroidal anti-inflammatory drugs (NSAIDs) and supplements.  This website has more information on Eliquis (apixaban): http://[email or web address].com/eliquis/home   Additional discharge instructions  Please get your medications reviewed and adjusted by your Primary MD.  Please request your Primary MD to go over all Hospital Tests and Procedure/Radiological results at the follow up, please get all Hospital records sent to your Prim MD by signing hospital release before you go home.  If you had Pneumonia of Lung problems at the Hospital: Please get a 2 view Chest X ray done in 6-8 weeks after hospital discharge or sooner if instructed by your Primary MD.  If you have Congestive Heart Failure: Please call your Cardiologist or Primary MD anytime you have any of the following symptoms:  1) 3 pound weight gain in 24 hours or 5 pounds in 1 week  2) shortness of breath, with or without a dry hacking cough  3) swelling in the hands, feet or stomach  4) if you have to sleep on extra pillows at night in order to breathe  Follow cardiac low salt diet and 1.5 lit/day fluid restriction.  If you have diabetes Accuchecks 4 times/day, Once in AM empty stomach and then before each meal. Log in all results and  show them to your primary doctor at your next visit. If any glucose reading is under 80 or above 300 call your primary MD immediately.  If you have Seizure/Convulsions/Epilepsy: Please do not drive, operate heavy machinery, participate in activities at heights or participate in high speed sports until you have seen by Primary MD or a Neurologist and advised to do so again.  If you had Gastrointestinal Bleeding: Please ask your  Primary MD to check a complete blood count within one week of discharge or at your next visit. Your endoscopic/colonoscopic biopsies that are pending at the time of discharge, will also need to followed by your Primary MD.  Get Medicines reviewed and adjusted. Please take all your medications with you for your next visit with your Primary MD  Please request your Primary MD to go over all hospital tests and procedure/radiological results at the follow up, please ask your Primary MD to get all Hospital records sent to his/her office.  If you experience worsening of your admission symptoms, develop shortness of breath, life threatening emergency, suicidal or homicidal thoughts you must seek medical attention immediately by calling 911 or calling your MD immediately  if symptoms less severe.  You must read complete instructions/literature along with all the possible adverse reactions/side effects for all the Medicines you take and that have been prescribed to you. Take any new Medicines after you have completely understood and accpet all the possible adverse reactions/side effects.   Do not drive or operate heavy machinery when taking Pain medications.   Do not take more than prescribed Pain, Sleep and Anxiety Medications  Special Instructions: If you have smoked or chewed Tobacco  in the last 2 yrs please stop smoking, stop any regular Alcohol  and or any Recreational drug use.  Wear Seat belts while driving.  Please note You were cared for by a hospitalist during your hospital stay. If you have any questions about your discharge medications or the care you received while you were in the hospital after you are discharged, you can call the unit and asked to speak with the hospitalist on call if the hospitalist that took care of you is not available. Once you are discharged, your primary care physician will handle any further medical issues. Please note that NO REFILLS for any discharge medications  will be authorized once you are discharged, as it is imperative that you return to your primary care physician (or establish a relationship with a primary care physician if you do not have one) for your aftercare needs so that they can reassess your need for medications and monitor your lab values.  You can reach the hospitalist office at phone [phone] or fax [phone]   If you do not have a primary care physician, you can call [phone] for a physician referral.

## 2017-04-01 NOTE — Progress Notes (Signed)
ANTICOAGULATION CONSULT NOTE - Initial Consult  Pharmacy Consult for Eliquis Indication: atrial fibrillation  Allergies  Allergen Reactions  . Allegra [Fexofenadine Hcl]     Unknown reaction  . Levaquin [Levofloxacin In D5w] Other (See Comments)    Unknown reaction  . Tussionex Pennkinetic Er [Hydrocod Polst-Cpm Polst Er] Nausea And Vomiting  . Codeine Nausea And Vomiting  . Morphine And Related Nausea And Vomiting    Patient Measurements: Weight: 144 lb (65.3 kg) Heparin Dosing Weight:   Vital Signs: Temp: 98.3 F (36.8 C) (01/25 1545) Temp Source: Oral (01/25 1545) BP: 117/75 (01/25 2330) Pulse Rate: 110 (01/25 2330)  Labs: Recent Labs    03/31/17 1604  HGB 10.0*  HCT 31.7*  PLT 182  LABPROT 22.1*  INR 1.95  CREATININE 1.08*    Estimated Creatinine Clearance: 30 mL/min (A) (by C-G formula based on SCr of 1.08 mg/dL (H)).   Medical History: Past Medical History:  Diagnosis Date  . Abnormality of gait   . Anemia, unspecified   . Anxiety state, unspecified   . Arthritis    BACK , NECK, AND JOINTS  . 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 CHF (congestive heart failure) ([place])   . Chronic cystitis   . Chronic steroid use   . COPD (chronic obstructive pulmonary disease) (HCC)    no inhalers used   . Depressive disorder, not elsewhere classified   . Dermatophytosis of nail   . Diabetic peripheral neuropathy ([place])   . Diaphragmatic hernia without mention of obstruction or gangrene   . Hemoperitoneum (nontraumatic)   . Herpes zoster with other nervous system complications(053.19)   . History of TIA (transient ischemic attack) 2012--  NO RESIDUAL  . HTN (hypertension)   . 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 Insulin dependent diabetes mellitus ([place])   . Macular degeneration of both eyes   . Malignant neoplasm of renal pelvis ([place])   . Osteoporosis   . Osteoporosis, unspecified   . Other and unspecified hyperlipidemia   . Personality change due  to conditions classified elsewhere   . PONV (postoperative nausea and vomiting)    in past, none recently  . 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 Temporal arteritis ([place])   . Type I (juvenile type) diabetes mellitus without mention of complication, uncontrolled   . Unspecified essential hypertension   . Unspecified late effects of cerebrovascular disease   . Urinary frequency      Assessment: 84 yo female on Eliquis PTA admitted s/p fall at SNF.  She had recent femur fracture earlier this month. She fell after forgetting about her healing fracture. She is on Eliquis 5 mg po bid - which is technically the appropriate dose; however she is borderline for the lower dose on two different accounts given her low body weight and eCrCl.  Given her recent fall, age, body weight and CrCl, I will lower her dose of Eliquis.   Goal of Therapy:  Monitor platelets by anticoagulation protocol: Yes   Plan:  Eliquis 2.5 mg po bid   Harvel Quale 04/01/2017,12:45 AM

## 2017-04-01 NOTE — Progress Notes (Signed)
PHARMACY NOTE:  ANTIMICROBIAL RENAL DOSAGE ADJUSTMENT  Current antimicrobial regimen includes a mismatch between antimicrobial dosage and estimated renal function.  As per policy approved by the Pharmacy & Therapeutics and Medical Executive Committees, the antimicrobial dosage will be adjusted accordingly.  Current antimicrobial dosage:  Cefepime 1g IV every 8 hours  Indication: r/o sepsis  Renal Function:  Estimated Creatinine Clearance: 29.4 mL/min (A) (by C-G formula based on SCr of 1.1 mg/dL (H)). []      On intermittent HD, scheduled: []      On CRRT    Antimicrobial dosage has been changed to:  Cefepime 2g IV every 24 hours  Additional comments:   Thank you for allowing pharmacy to be a part of this patient's care.  Lawson Radar, [facility] 04/01/2017 5:00 PM

## 2017-04-01 NOTE — Progress Notes (Signed)
Critical troponin of 0.03 text paged to attending MD. Awaiting response.

## 2017-04-02 ENCOUNTER — Inpatient Hospital Stay (HOSPITAL_COMMUNITY): Payer: Medicare Other

## 2017-04-02 ENCOUNTER — Other Ambulatory Visit: Payer: Self-pay

## 2017-04-02 DIAGNOSIS — L8990 Pressure ulcer of unspecified site, unspecified stage: Secondary | ICD-10-CM

## 2017-04-02 DIAGNOSIS — R579 Shock, unspecified: Principal | ICD-10-CM

## 2017-04-02 LAB — CBC WITH DIFFERENTIAL/PLATELET
BASOS ABS: 0 10*3/uL (ref 0.0–0.1)
BASOS ABS: 0 10*3/uL (ref 0.0–0.1)
BASOS ABS: 0 10*3/uL (ref 0.0–0.1)
BASOS PCT: 0 %
Band Neutrophils: 0 %
Basophils Relative: 0 %
Basophils Relative: 0 %
Blasts: 0 %
EOS ABS: 0 10*3/uL (ref 0.0–0.7)
EOS PCT: 1 %
Eosinophils Absolute: 0.2 10*3/uL (ref 0.0–0.7)
Eosinophils Absolute: 0.1 10*3/uL (ref 0.0–0.7)
Eosinophils Relative: 1 %
Eosinophils Relative: 0 %
HCT: 20.9 % — ABNORMAL LOW (ref 36.0–46.0)
HEMATOCRIT: 25.7 % — AB (ref 36.0–46.0)
HEMATOCRIT: 22.7 % — AB (ref 36.0–46.0)
HEMOGLOBIN: 6.5 g/dL — AB (ref 12.0–15.0)
Hemoglobin: 7.9 g/dL — ABNORMAL LOW (ref 12.0–15.0)
Hemoglobin: 7.2 g/dL — ABNORMAL LOW (ref 12.0–15.0)
LYMPHS ABS: 2.6 10*3/uL (ref 0.7–4.0)
LYMPHS ABS: 0.9 10*3/uL (ref 0.7–4.0)
LYMPHS PCT: 21 %
LYMPHS PCT: 11 %
Lymphocytes Relative: 15 %
Lymphs Abs: 1.6 10*3/uL (ref 0.7–4.0)
MCH: 31.7 pg (ref 26.0–34.0)
MCH: 32.6 pg (ref 26.0–34.0)
MCH: 32.2 pg (ref 26.0–34.0)
MCHC: 30.7 g/dL (ref 30.0–36.0)
MCHC: 31.7 g/dL (ref 30.0–36.0)
MCHC: 31.1 g/dL (ref 30.0–36.0)
MCV: 103.2 fL — AB (ref 78.0–100.0)
MCV: 102.7 fL — ABNORMAL HIGH (ref 78.0–100.0)
MCV: 103.5 fL — ABNORMAL HIGH (ref 78.0–100.0)
METAMYELOCYTES PCT: 0 %
MONO ABS: 1.5 10*3/uL — AB (ref 0.1–1.0)
MONO ABS: 1 10*3/uL (ref 0.1–1.0)
MONO ABS: 0.3 10*3/uL (ref 0.1–1.0)
MYELOCYTES: 0 %
Monocytes Relative: 12 %
Monocytes Relative: 10 %
Monocytes Relative: 3 %
NEUTROS ABS: 8.3 10*3/uL — AB (ref 1.7–7.7)
NEUTROS ABS: 7.8 10*3/uL — AB (ref 1.7–7.7)
NEUTROS PCT: 86 %
Neutro Abs: 7.2 10*3/uL (ref 1.7–7.7)
Neutrophils Relative %: 66 %
Neutrophils Relative %: 74 %
PLATELETS: 131 10*3/uL — AB (ref 150–400)
PLATELETS: 100 10*3/uL — AB (ref 150–400)
Platelets: 138 10*3/uL — ABNORMAL LOW (ref 150–400)
Promyelocytes Absolute: 0 %
RBC: 2.49 MIL/uL — AB (ref 3.87–5.11)
RBC: 2.21 MIL/uL — ABNORMAL LOW (ref 3.87–5.11)
RBC: 2.02 MIL/uL — AB (ref 3.87–5.11)
RDW: 17.4 % — AB (ref 11.5–15.5)
RDW: 17.2 % — AB (ref 11.5–15.5)
RDW: 17.3 % — ABNORMAL HIGH (ref 11.5–15.5)
WBC: 12.5 10*3/uL — ABNORMAL HIGH (ref 4.0–10.5)
WBC: 10.5 10*3/uL (ref 4.0–10.5)
WBC: 8.4 10*3/uL (ref 4.0–10.5)
nRBC: 0 /100 WBC

## 2017-04-02 LAB — GLUCOSE, CAPILLARY
GLUCOSE-CAPILLARY: 95 mg/dL (ref 65–99)
GLUCOSE-CAPILLARY: 149 mg/dL — AB (ref 65–99)
Glucose-Capillary: 162 mg/dL — ABNORMAL HIGH (ref 65–99)
Glucose-Capillary: 234 mg/dL — ABNORMAL HIGH (ref 65–99)

## 2017-04-02 LAB — BASIC METABOLIC PANEL
ANION GAP: 12 (ref 5–15)
BUN: 23 mg/dL — ABNORMAL HIGH (ref 6–20)
CHLORIDE: 103 mmol/L (ref 101–111)
CO2: 20 mmol/L — ABNORMAL LOW (ref 22–32)
Calcium: 8.3 mg/dL — ABNORMAL LOW (ref 8.9–10.3)
Creatinine, Ser: 0.85 mg/dL (ref 0.44–1.00)
GFR calc Af Amer: >60 mL/min (ref >60)
GFR, EST NON AFRICAN AMERICAN: 59 mL/min — AB (ref >60)
GLUCOSE: 110 mg/dL — AB (ref 65–99)
POTASSIUM: 3.9 mmol/L (ref 3.5–5.1)
Sodium: 135 mmol/L (ref 135–145)

## 2017-04-02 LAB — PREPARE RBC (CROSSMATCH)

## 2017-04-02 LAB — PROCALCITONIN: PROCALCITONIN: 0.28 ng/mL

## 2017-04-02 MED ORDER — METHYLPREDNISOLONE SODIUM SUCC 40 MG IJ SOLR
40.0000 mg | Freq: Two times a day (BID) | INTRAMUSCULAR | Status: DC
  Administered 2017-04-03 – 2017-04-04 (×3): 40 mg via INTRAVENOUS
  Filled 2017-04-02 (×4): qty 1

## 2017-04-02 MED ORDER — PREDNISONE 20 MG PO TABS
20.0000 mg | ORAL_TABLET | Freq: Every day | ORAL | Status: DC
  Administered 2017-04-02: 20 mg via ORAL
  Filled 2017-04-02: qty 1

## 2017-04-02 MED ORDER — SODIUM CHLORIDE 0.9 % IV BOLUS (SEPSIS)
500.0000 mL | Freq: Once | INTRAVENOUS | Status: AC
  Administered 2017-04-02: 500 mL via INTRAVENOUS

## 2017-04-02 MED ORDER — SODIUM CHLORIDE 0.9 % IV BOLUS (SEPSIS): 500.0000 mL | Freq: Once | INTRAVENOUS | Status: DC

## 2017-04-02 MED ORDER — SODIUM CHLORIDE 0.9 % IV SOLN: Freq: Once | INTRAVENOUS | Status: DC

## 2017-04-02 MED ORDER — METOPROLOL TARTRATE 12.5 MG HALF TABLET
12.5000 mg | ORAL_TABLET | Freq: Two times a day (BID) | ORAL | Status: DC
  Administered 2017-04-03 – 2017-04-06 (×7): 12.5 mg via ORAL
  Filled 2017-04-02 (×7): qty 1

## 2017-04-02 MED ORDER — METOPROLOL TARTRATE 5 MG/5ML IV SOLN
2.5000 mg | INTRAVENOUS | Status: DC | PRN
  Administered 2017-04-02: 2.5 mg via INTRAVENOUS
  Filled 2017-04-02: qty 5

## 2017-04-02 NOTE — Progress Notes (Signed)
PROGRESS NOTE  [name] PPI:[id number] DOB: October 31, 1924 DOA: 03/31/2017 PCP: Patient, No Pcp Per   LOS: 1 day   Brief Narrative / Interim history: 83 year old female with history of diabetes, hypertension, hyperlipidemia, temporal arteritis, COPD, osteoporosis, who was in a skilled nursing facility and presents to the hospital with a fall and inability to bear weight.  Patient was recently hospitalized and operated by Dr. Doreatha Martin for right femur periprosthetic fracture between her right total knee arthroplasty and right hip hemiarthroplasty, surgery was done on 03/15/2017.  She also has a history of left knee TKA in the past.  Assessment & Plan: Principal Problem:   Leg pain Active Problems:   Anemia   Chronic kidney disease, stage III (moderate) (HCC)   Atrial fibrillation (HCC)   Left leg pain   Shock ([place])   Pressure injury of skin   Shock -On 1/26, patient was stable, interactive up until about 4 PM when she developed sudden onset hypotension requiring ICU transfer and vasopressor initiation -Critical care consulted, appreciate input, she is off vasopressors today and appears improved -Unclear etiology of her shock, possibly multifactorial due to dehydration/UTI/chronic steroids at home/?  Following Zanaflex (this can cause hypotension).  Urine cultures are pending, continue empiric antibiotics  Left leg pain -X-rays without acute fractures, orthopedic surgery consulted, symptomatic management for now  Chronic kidney disease stage III -Creatinine appears at baseline  Leukocytosis -Likely reactive, no source of infection  Anemia of chronic disease/iron deficiency -No evidence of bleeding, hemoglobin overall stable -Continue iron supplementation  Paroxysmal A. fib -Continue metoprolol for rate control, increased dose today for rates in the low 100s -Continue Eliquis  Type 2 diabetes mellitus -Continue Tradjenta, Amaryl, continue sliding scale insulin -Continue  Neurontin for neuropathic pain  Polymyalgia rheumatica -Keep her on higher dose steroids, will likely need another taper to her chronic home dose   DVT prophylaxis: Eliquis Code Status: Full code Family Communication: No family at bedside Disposition Plan: Return to SNF when stable  Consultants:   Orthopedic surgery  Procedures:   None  Antimicrobials:  None   Subjective: -Complains of a headache, alert this morning, denies any other pain, no abdominal pain, no nausea or vomiting  Objective: Vitals:   04/02/17 0700 04/02/17 0740 04/02/17 0850 04/02/17 1143  BP: 103/70  (!) 103/58   Pulse: (!) 107  (!) 112   Resp: 15     Temp:  97.6 F (36.4 C)  98.5 F (36.9 C)  TempSrc:  Oral  Oral  SpO2: 92%     Weight:      Height:        Intake/Output Summary (Last 24 hours) at 04/02/2017 1159 Last data filed at 04/02/2017 0700 Gross per 24 hour  Intake 2272.57 ml  Output -  Net 2272.57 ml   Filed Weights   04/01/17 0000 04/01/17 1800 04/02/17 0500  Weight: 65.3 kg (144 lb) 66.6 kg (146 lb 13.2 oz) 67.8 kg (149 lb 7.6 oz)    Examination:  Constitutional: No distress, pale appearing Eyes: No scleral icterus ENMT: Moist mucous membranes Respiratory: Clear to auscultation bilaterally, no wheezing, no crackles Cardiovascular: Irregular, 2/6 SEM, no edema Abdomen: Soft, nontender, nondistended.  Bowel sounds positive. Musculoskeletal: no clubbing / cyanosis.  Left knee is swollen, appears very tender to palpation, no erythema, no warmth Skin: No rashes, surgical sites are healing well  Neurologic: Nonfocal   Data Reviewed: I have independently reviewed following labs and imaging studies   CBC: Recent Labs  Lab 03/31/17 1604 04/01/17 0608 04/01/17 1743 04/01/17 1940 04/02/17 0516 04/02/17 1107  WBC 11.2* 14.9* 13.1* 11.7* 12.5* 10.5  NEUTROABS 7.9*  --  8.9* 8.4* 8.3* 7.8*  HGB 10.0* 9.2* 7.9* 7.7* 7.9* 7.2*  HCT 31.7* 29.0* 25.2* 24.3* 25.7* 22.7*  MCV  102.3* 102.5* 102.9* 102.5* 103.2* 102.7*  PLT 182 190 150 147* 138* 540*   Basic Metabolic Panel: Recent Labs  Lab 03/31/17 1604 04/01/17 0608 04/02/17 0516  NA 135 134* 135  K 4.2 3.9 3.9  CL 100* 99* 103  CO2 23 21* 20*  GLUCOSE 152* 187* 110*  BUN 29* 25* 23*  CREATININE 1.08* 1.10* 0.85  CALCIUM 8.8* 8.8* 8.3*   GFR: Estimated Creatinine Clearance: 38.8 mL/min (by C-G formula based on SCr of 0.85 mg/dL). Liver Function Tests: Recent Labs  Lab 03/31/17 1604 04/01/17 0608  AST 35 23  ALT 18 15  ALKPHOS 122 110  BILITOT 1.6* 1.5*  PROT 5.9* 5.7*  ALBUMIN 3.1* 2.9*   No results for input(s): LIPASE, AMYLASE in the last 168 hours. No results for input(s): AMMONIA in the last 168 hours. Coagulation Profile: Recent Labs  Lab 03/31/17 1604  INR 1.95   Cardiac Enzymes: Recent Labs  Lab 04/01/17 0104 04/01/17 0608 04/01/17 1114  TROPONINI <0.03 <0.03 0.03*   BNP (last 3 results) No results for input(s): PROBNP in the last 8760 hours. HbA1C: No results for input(s): HGBA1C in the last 72 hours. CBG: Recent Labs  Lab 04/01/17 1650 04/01/17 1819 04/01/17 2202 04/02/17 0733 04/02/17 1141  GLUCAP 222* 194* 219* 95 149*   Lipid Profile: No results for input(s): CHOL, HDL, LDLCALC, TRIG, CHOLHDL, LDLDIRECT in the last 72 hours. Thyroid Function Tests: No results for input(s): TSH, T4TOTAL, FREET4, T3FREE, THYROIDAB in the last 72 hours. Anemia Panel: No results for input(s): VITAMINB12, FOLATE, FERRITIN, TIBC, IRON, RETICCTPCT in the last 72 hours. Urine analysis:    Component Value Date/Time   COLORURINE AMBER (A) 04/01/2017 1318   APPEARANCEUR HAZY (A) 04/01/2017 1318   APPEARANCEUR Clear 05/07/2013 1436   LABSPEC 1.020 04/01/2017 1318   PHURINE 5.0 04/01/2017 1318   GLUCOSEU NEGATIVE 04/01/2017 1318   HGBUR NEGATIVE 04/01/2017 1318   BILIRUBINUR NEGATIVE 04/01/2017 1318   BILIRUBINUR Neg 12/11/2013 1345   BILIRUBINUR Negative 05/07/2013 1436    KETONESUR 5 (A) 04/01/2017 1318   PROTEINUR NEGATIVE 04/01/2017 1318   UROBILINOGEN 0.2 12/11/2013 1345   UROBILINOGEN 1.0 11/16/2013 1256   NITRITE POSITIVE (A) 04/01/2017 1318   LEUKOCYTESUR MODERATE (A) 04/01/2017 1318   LEUKOCYTESUR Negative 05/07/2013 1436   Sepsis Labs: Invalid input(s): PROCALCITONIN, LACTICIDVEN  Recent Results (from the past 240 hour(s))  MRSA PCR Screening     Status: None   Collection Time: 04/01/17  6:06 AM  Result Value Ref Range Status   MRSA by PCR NEGATIVE NEGATIVE Final    Comment:        The GeneXpert MRSA Assay (FDA approved for NASAL specimens only), is one component of a comprehensive MRSA colonization surveillance program. It is not intended to diagnose MRSA infection nor to guide or monitor treatment for MRSA infections.       Radiology Studies: Dg Chest 1 View  Result Date: 03/31/2017 CLINICAL DATA:  Fall EXAM: CHEST 1 VIEW COMPARISON:  03/14/2017 FINDINGS: Postsurgical changes in the cervical spine. Mild atelectasis at the left base. No focal consolidation. No pneumothorax. Mild cardiomegaly with aortic atherosclerosis. Treated vertebral compression fracture in the midthoracic spine. IMPRESSION: 1. Minimal left  basilar atelectasis. 2. Stable mild cardiomegaly. Electronically Signed   By: Donavan Foil M.D.   On: 03/31/2017 18:18   Dg Pelvis 1-2 Views  Result Date: 03/31/2017 CLINICAL DATA:  Fall EXAM: PELVIS - 1-2 VIEW COMPARISON:  CT abdomen pelvis 09/11/2016 FINDINGS: Status post bilateral hip replacements with normal alignment. No acute displaced fracture is seen. The SI joints do not appear widened. Pubic symphysis and rami are intact. IMPRESSION: Postsurgical changes of both hips. No definite acute osseous abnormality Electronically Signed   By: Donavan Foil M.D.   On: 03/31/2017 18:16   Ct Head Wo Contrast  Result Date: 03/31/2017 CLINICAL DATA:  Altered level of consciousness, unexplained. Confusion EXAM: CT HEAD WITHOUT  CONTRAST TECHNIQUE: Contiguous axial images were obtained from the base of the skull through the vertex without intravenous contrast. COMPARISON:  03/13/2017 FINDINGS: Brain: No evidence of acute infarction, hemorrhage, hydrocephalus, extra-axial collection or mass lesion/mass effect. Mild chronic small vessel ischemia in the periventricular white matter. Generalized volume loss, age congruent. Vascular: Atherosclerotic calcification.  No hyperdense vessel. Skull: Negative Sinuses/Orbits: Chronic left maxillary sinusitis with complete opacification. This has been seen since at least 2012. bilateral cataract resection. IMPRESSION: 1. Senescent changes without acute finding. 2. Chronic left maxillary sinusitis. Electronically Signed   By: Monte Fantasia M.D.   On: 03/31/2017 16:38   Dg Femur Min 2 Views Left  Result Date: 03/31/2017 CLINICAL DATA:  Bilateral leg pain EXAM: LEFT FEMUR 2 VIEWS COMPARISON:  03/14/2017 FINDINGS: No acute displaced fracture is seen. Patient is status post left hip replacement with normal alignment. Status post left knee replacement with normal alignment. Moderate to large knee effusion with meniscus level. Vascular calcifications. IMPRESSION: 1. Status post left hip and knee replacement with normal alignment 2. Moderate to large knee effusion with meniscus level but no definitive fracture seen. Electronically Signed   By: Donavan Foil M.D.   On: 03/31/2017 18:14   Dg Femur Min 2 Views Right  Result Date: 03/31/2017 CLINICAL DATA:  Status post fall today.  Initial encounter. EXAM: RIGHT FEMUR 2 VIEWS COMPARISON:  Plain films of the right femur 03/15/2017, 03/13/2017 and 03/17/2017. FINDINGS: Plate and screw fixation of a comminuted diaphyseal fracture appears unchanged since the most recent exams. The patient is status post right hip and knee replacement. No acute abnormality is identified. IMPRESSION: No acute finding. Status post fixation of a comminuted right femur fracture.  Status post right hip and knee replacement. Electronically Signed   By: Inge Rise M.D.   On: 03/31/2017 18:07     Scheduled Meds: . apixaban  2.5 mg Oral BID  . famotidine  10 mg Oral BID  . ferrous sulfate  325 mg Oral BID WC  . gabapentin  300 mg Oral Daily   And  . gabapentin  600 mg Oral QHS  . glimepiride  2 mg Oral Q breakfast  . insulin aspart  0-5 Units Subcutaneous QHS  . insulin aspart  0-9 Units Subcutaneous TID WC  . linagliptin  5 mg Oral Daily  . metoprolol tartrate  25 mg Oral BID  . mirtazapine  15 mg Oral QHS  . mupirocin ointment  1 application Topical BID  . predniSONE  20 mg Oral Q breakfast  . sodium chloride flush  3 mL Intravenous Q12H  . ursodiol  300 mg Oral Daily   Continuous Infusions: . sodium chloride    . ceFEPime (MAXIPIME) IV Stopped (04/02/17 2694)  . lactated ringers 150 mL/hr at 04/02/17 0751  .  norepinephrine (LEVOPHED) Adult infusion Stopped (04/02/17 0500)     Marzetta Board, MD, PhD [facility] Pager [phone]  If 7PM-7AM, please contact night-coverage [email or web address] Password [email or web address] 04/02/2017, 11:59 AM

## 2017-04-02 NOTE — Consult Note (Addendum)
PULMONARY / CRITICAL CARE MEDICINE   Name: [name] MRN: [id number] DOB: Oct 02, 1924    ADMISSION DATE:  03/31/2017 CONSULTATION DATE:  04/01/2017    CHIEF COMPLAINT: Hypotension  HISTORY OF PRESENT ILLNESS:       84 year old diabetic  Adm 1/25 who suffered from a fall requiring ORIF of the right femur on 1/9 she fell again while in rehab and was complaining of leg pain and was admitted to the hospital.  She is chronically on Xarelto for ? Paroxysmal AF  Rapid response was called 1/26  for hypotension.    SUBJECTIVE:  BP improved, off levo gtt C/o pain left knee  VITAL SIGNS: BP (!) 103/58   Pulse (!) 112   Temp 97.6 F (36.4 C) (Oral)   Resp 15   Ht 5\' 1"  (1.549 m)   Wt 149 lb 7.6 oz (67.8 kg)   SpO2 92%   BMI 28.24 kg/m   HEMODYNAMICS:    VENTILATOR SETTINGS:    INTAKE / OUTPUT: I/O last 3 completed shifts: In: 2372.6 [P.O.:210; I.V.:2062.6; IV Piggyback:100] Out: -   PHYSICAL EXAMINATION: General: Elderly white female , calm Neuro:'s awake, alert, non focal Cardiovascular: S1 and S2 are regular without murmur rub or gallop PMI is unremarkable.  The periphery is pink and warm. Lungs: Respirations are unlabored, the lungs are clear to supine exam Abdomen: The abdomen is soft without any organomegaly masses tenderness guarding or rebound.  Musculoskeletal: c/o pain when legs moved., swollen left knee   LABS:  BMET Recent Labs  Lab 03/31/17 1604 04/01/17 0608 04/02/17 0516  NA 135 134* 135  K 4.2 3.9 3.9  CL 100* 99* 103  CO2 23 21* 20*  BUN 29* 25* 23*  CREATININE 1.08* 1.10* 0.85  GLUCOSE 152* 187* 110*    Electrolytes Recent Labs  Lab 03/31/17 1604 04/01/17 0608 04/02/17 0516  CALCIUM 8.8* 8.8* 8.3*    CBC Recent Labs  Lab 04/01/17 1743 04/01/17 1940 04/02/17 0516  WBC 13.1* 11.7* 12.5*  HGB 7.9* 7.7* 7.9*  HCT 25.2* 24.3* 25.7*  PLT 150 147* 138*    Coag's Recent Labs  Lab 03/31/17 1604  INR 1.95    Sepsis  Markers Recent Labs  Lab 04/01/17 1743 04/01/17 1940 04/02/17 0516  LATICACIDVEN 1.9 1.6  --   PROCALCITON 0.24  --  0.28    ABG Recent Labs  Lab 04/01/17 1622  PHART 7.430  PCO2ART 37.0  PO2ART 130.0*    Liver Enzymes Recent Labs  Lab 03/31/17 1604 04/01/17 0608  AST 35 23  ALT 18 15  ALKPHOS 122 110  BILITOT 1.6* 1.5*  ALBUMIN 3.1* 2.9*    Cardiac Enzymes Recent Labs  Lab 04/01/17 0104 04/01/17 0608 04/01/17 1114  TROPONINI <0.03 <0.03 0.03*    Glucose Recent Labs  Lab 04/01/17 0646 04/01/17 1118 04/01/17 1650 04/01/17 1819 04/01/17 2202 04/02/17 0733  GLUCAP 176* 167* 222* 194* 219* 95    Imaging No results found.    DISCUSSION:   84 year old diabetic with apparent delirium/dementia who has suffered from a right femur fracture on 1/9 and subsequently fell at rehab and was complaining of right leg pain.  No new fracture was found.  Hypotension with drop in Hb from 10 to 7.9   ASSESSMENT / PLAN:  A: Hypotension.  I think the major considerations here are hypovolemia and urinary tract infection.   - improved, off levo gtt - if Hb continues to drop, consider CT imaging of hip -Unclear why  on prednisone ? Is adrenal insuff a concern? - resume metoprolol oral & IV prn -ct cefepime for UTI until cx back , low pct reassuring   A: Dementia /delirium.  Freq re-orientation -start PT  , cleared by ortho  PCCM available as needed  Kara Mead MD. FCCP. [facility] Pager [phone] If no response call [phone]   04/02/2017, 10:52 AM

## 2017-04-02 NOTE — Evaluation (Signed)
Clinical/Bedside Swallow Evaluation Patient Details  Name: [name] MRN: [id number] Date of Birth: [birth year]  Today's Date: 04/02/2017 Time: SLP Start Time (ACUTE ONLY): 0740 SLP Stop Time (ACUTE ONLY): 0755 SLP Time Calculation (min) (ACUTE ONLY): 15 min  Past Medical History:  Past Medical History:  Diagnosis Date  . Abnormality of gait   . Anemia, unspecified   . Anxiety state, unspecified   . Arthritis    BACK , NECK, AND JOINTS  . 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 CHF (congestive heart failure) ([place])   . Chronic cystitis   . Chronic steroid use   . COPD (chronic obstructive pulmonary disease) (HCC)    no inhalers used   . Depressive disorder, not elsewhere classified   . Dermatophytosis of nail   . Diabetic peripheral neuropathy ([place])   . Diaphragmatic hernia without mention of obstruction or gangrene   . Hemoperitoneum (nontraumatic)   . Herpes zoster with other nervous system complications(053.19)   . History of TIA (transient ischemic attack) 2012--  NO RESIDUAL  . HTN (hypertension)   . 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 Insulin dependent diabetes mellitus ([place])   . Macular degeneration of both eyes   . Malignant neoplasm of renal pelvis ([place])   . Osteoporosis   . Osteoporosis, unspecified   . Other and unspecified hyperlipidemia   . Personality change due to conditions classified elsewhere   . PONV (postoperative nausea and vomiting)    in past, none recently  . 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 Temporal arteritis ([place])   . Type I (juvenile type) diabetes mellitus without mention of complication, uncontrolled   . Unspecified essential hypertension   . Unspecified late effects of cerebrovascular disease   . Urinary frequency    Past Surgical History:  Past Surgical History:  Procedure Laterality Date  . ANTERIOR CERVICAL DECOMP/DISCECTOMY FUSION  10-12-2005   C5 -- C7  . CARPAL TUNNEL  RELEASE Right 2009  (APPROX)  . CATARACT EXTRACTION W/ INTRAOCULAR LENS  IMPLANT, BILATERAL Right Oct 1992   [facility]  . CATARACT EXTRACTION W/ INTRAOCULAR LENS IMPLANT Left 1993   [place]  . CRYOABLATION RIGHT RENAL MASS  03-16-2012   ONCOCYTOMA (POST OP 06-09-2012 PERCUTANEOUS DRAIN PLACEMENT FOR PERINEPHRIC ABSCESS)  . CYSTOSCOPY W/ URETERAL STENT PLACEMENT Right 07/10/2012   Procedure: CYSTOSCOPY WITH RIGHT RETROGRADE PYELOGRAM/URETERAL STENT PLACEMENT;  Surgeon: Malka So, MD;  Location[facility];  Service: Urology;  Laterality: Right;  . ELBOW SURGERY Bilateral 2003  (APPROX)  . HIP ARTHROPLASTY Right 08/03/2012   Procedure: CEMENTED MONOPOLAR HIP;  Surgeon: Marybelle Killings, MD;  Location: [facility];  Service: Orthopedics;  Laterality: Right;  . LUMBAR LAMINECTOMY  11-19-2003   L3 -- L4  . ORIF FEMUR FRACTURE Right 03/15/2017   Procedure: OPEN REDUCTION INTERNAL FIXATION (ORIF) DISTAL FEMUR FRACTURE;  Surgeon: Shona Needles, MD;  Location: [facility];  Service: Orthopedics;  Laterality: Right;  . ORIF W/ PLATE RIGHT DISTAL RADIUS FX  06-18-2003  . POSTERIOR FUSION CERVICAL SPINE  02-10-2006   C5 -- C7  . TEMPORAL ARTERY BIOPSY / LIGATION    . TOTAL HIP ARTHROPLASTY Left 01-22-2005  . TOTAL KNEE ARTHROPLASTY Bilateral LEFT 08-08-2007;  RIGHT 05-08-2009   HPI:  Pt is a 84 year old diabetic who suffered from a fall requiring ORIF of the right femur on 1/9; she fell again while in rehab and was complaining of leg pain and  was admitted to the hospital. No new fracture was found.Rapid response was called 04/01/17 for hypotension and pt transferred to ICU. Pt has MBS 01/13/2011 which revealed mild pharyngeal dysphagia and suspected primary esophageal dysphagia. Pt had intermittent penetration of thin and nectar thick liquids when consuming in conjunction with solids, barium stasis in distal to mid esophagus. Regular diet/thin liquids was recommended. CXR 03/31/17 showed minimal L basilar  atelectasis, stable mild cardiomegaly.   Assessment / Plan / Recommendation Clinical Impression  Patient presents with oropharyngeal swallow appearing at bedside to be grossly within functional limits for age. Pt has history of mild pharyngeal dysphagia with intermittent penetration of thin and nectar thick liquids when consumed simultaneously with solids. Today she declines to self-feed because she is shivering with cold. RN provided additional blanket however pt unable to hold cup/ spoon due to shaking. With consecutive sips of thin liquids, pt has an immediate cough, suspect possible penetration given history. Her airway protection appears adequate for single sips. Dentures are not available, and while her mastication is prolonged with regular solid cracker, it appears functional. Pt states she has been tolerating regular solids this admission without difficulty despite not having her dentures. Recommend regular diet with thin liquids, meds whole with liquid. Allow/assist pt with self-feeding as much as she is able. Educated pt re: esophageal precautions as alertnating solids and liquids was seen to improve esophageal clearance on MBS. D/w RN. No further follow-up required at this time. SLP will s/o.  SLP Visit Diagnosis: Dysphagia, unspecified (R13.10)    Aspiration Risk  Mild aspiration risk    Diet Recommendation Regular;Thin liquid   Liquid Administration via: Cup Medication Administration: Whole meds with liquid Supervision: Staff to assist with self feeding Compensations: Slow rate;Small sips/bites;Follow solids with liquid    Other  Recommendations Oral Care Recommendations: Oral care BID   Follow up Recommendations Skilled Nursing facility      Frequency and Duration            Prognosis Prognosis for Safe Diet Advancement: Good      Swallow Study   General Date of Onset: 03/31/17 HPI: Pt is a 84 year old diabetic who suffered from a fall requiring ORIF of the right femur  on 1/9; she fell again while in rehab and was complaining of leg pain and was admitted to the hospital. No new fracture was found.Rapid response was called 04/01/17 for hypotension and pt transferred to ICU. Pt has MBS 01/13/2011 which revealed mild pharyngeal dysphagia and suspected primary esophageal dysphagia. Pt had intermittent penetration of thin and nectar thick liquids when consuming in conjunction with solids, barium stasis in distal to mid esophagus. Regular diet/thin liquids was recommended. CXR 03/31/17 showed minimal L basilar atelectasis, stable mild cardiomegaly. Type of Study: Bedside Swallow Evaluation Previous Swallow Assessment: see HPI Diet Prior to this Study: Regular;Thin liquids Temperature Spikes Noted: No Respiratory Status: Nasal cannula History of Recent Intubation: No Behavior/Cognition: Alert;Cooperative Oral Cavity Assessment: Within Functional Limits Oral Care Completed by SLP: No Oral Cavity - Dentition: Dentures, not available;Edentulous Vision: Impaired for self-feeding Self-Feeding Abilities: Needs assist Patient Positioning: Upright in bed Baseline Vocal Quality: Low vocal intensity Volitional Cough: Strong Volitional Swallow: Able to elicit    Oral/Motor/Sensory Function Overall Oral Motor/Sensory Function: Within functional limits   Ice Chips Ice chips: Not tested   Thin Liquid Thin Liquid: Impaired Pharyngeal  Phase Impairments: Cough - Immediate    Nectar Thick Nectar Thick Liquid: Not tested   Honey Thick Honey Thick Liquid: Not  tested   Puree Puree: Within functional limits Presentation: Spoon   Solid   GO   Solid: Impaired Oral Phase Impairments: Impaired mastication Oral Phase Functional Implications: Impaired mastication       Deneise Lever, MS, CCC-SLP Speech-Language Pathologist [place] 04/02/2017,8:05 AM

## 2017-04-02 NOTE — Evaluation (Signed)
Physical Therapy Evaluation Patient Details Name: [name] MRN: [id number] DOB: 09/03/1924 Today's Date: 04/02/2017   History of Present Illness  This is a 84 year old diabetic who suffered from a fall requiring ORIF of the right femur on 1/9 she fell again while in rehab and was complaining of leg pain and was admitted to the hospital. No new fracture was found. Rapid response was called on1/26 for hypotension. She has a positive urinalysis. PMH includes: DM1, osteoporosis, TIA, COPD, CHF, BTKA, LTHA, lumbar lam.   Clinical Impression  Pt admitted with above diagnosis. Pt currently with functional limitations due to the deficits listed below (see PT Problem List). Pt limited with all mobility due to pain in LE's. Interestingly, the R knee was sore and swollen but she could tolerate movement of it. However, she could not tolerate touch or movement of LLE and cried out in pain, especially when knee bent slightly sitting EOB. She reported pain from L hip to L knee. Maintained sitting EOB <5 mins and had to return to supine. Will progress activity as pt tolerates. Pt shivering uncontrollably throughout most of session today.  Pt will benefit from skilled PT to increase their independence and safety with mobility to allow discharge to the venue listed below.       Follow Up Recommendations SNF;Supervision/Assistance - 24 hour    Equipment Recommendations  None recommended by PT    Recommendations for Other Services       Precautions / Restrictions Precautions Precautions: Fall Restrictions Weight Bearing Restrictions: Yes RLE Weight Bearing: Non weight bearing LLE Weight Bearing: Weight bearing as tolerated      Mobility  Bed Mobility Overal bed mobility: Needs Assistance Bed Mobility: Supine to Sit;Sit to Supine     Supine to sit: Max assist;+2 for physical assistance Sit to supine: Max assist;+2 for physical assistance   General bed mobility comments: pt pivoted to EOB  with use of pad by therapist and tech and pt crying out with all movement. Once to EOB, attempted to let R knee bend but pt unable to tolerate. COuld not tolerate any movement of LLE. Maintained sitting less than 5 mins before pt stated she couldn't take it and had to lie back down.   Transfers                 General transfer comment: unable to attempt due to pain  Ambulation/Gait             General Gait Details: unable  Stairs            Wheelchair Mobility    Modified Rankin (Stroke Patients Only)       Balance Overall balance assessment: Needs assistance Sitting-balance support: Bilateral upper extremity supported;Feet supported Sitting balance-Leahy Scale: Zero Sitting balance - Comments: pt leaning posterior, max A to maintain sitting Postural control: Posterior lean                                   Pertinent Vitals/Pain Pain Assessment: Faces Faces Pain Scale: Hurts worst Pain Location: L entire leg (10/10 with mvmt), R knee soreness with movement Pain Descriptors / Indicators: Crying;Aching(yelling out) Pain Intervention(s): Limited activity within patient's tolerance;Monitored during session;Repositioned    Home Living Family/patient expects to be discharged to:: Skilled nursing facility                      Prior Function  Level of Independence: Needs assistance   Gait / Transfers Assistance Needed: was ambulating with RW, just fell again at SNF  ADL's / Homemaking Assistance Needed: assist needed for ADL's since recent fx        Hand Dominance        Extremity/Trunk Assessment   Upper Extremity Assessment Upper Extremity Assessment: Generalized weakness    Lower Extremity Assessment Lower Extremity Assessment: Generalized weakness;RLE deficits/detail;LLE deficits/detail RLE Deficits / Details: swelling R knee noted. Had some discomfort to the touch around the R knee and pain with bending R knee when sitting  EOB RLE: Unable to fully assess due to pain LLE Deficits / Details: pt cried out with all movement of LLE. Could not tolerate L knee bending in sitting. C/o pain from the hip to the knee and then the whole leg. Tender to all touch LLE: Unable to fully assess due to pain    Cervical / Trunk Assessment Cervical / Trunk Assessment: Kyphotic  Communication   Communication: No difficulties  Cognition Arousal/Alertness: Awake/alert Behavior During Therapy: WFL for tasks assessed/performed;Anxious Overall Cognitive Status: Within Functional Limits for tasks assessed                                 General Comments: Anxious and fearfull over pain      General Comments General comments (skin integrity, edema, etc.): Pt shivering throughout session, HR up to 145 when shivering intensified, mostly remained in low 120's though    Exercises General Exercises - Lower Extremity Ankle Circles/Pumps: AROM;Both;10 reps;Supine   Assessment/Plan    PT Assessment Patient needs continued PT services  PT Problem List Decreased strength;Decreased range of motion;Decreased activity tolerance;Decreased balance;Decreased mobility;Decreased coordination;Decreased knowledge of use of DME;Decreased safety awareness;Decreased knowledge of precautions;Pain       PT Treatment Interventions DME instruction;Gait training;Stair training;Functional mobility training;Therapeutic activities;Therapeutic exercise    PT Goals (Current goals can be found in the Care Plan section)  Acute Rehab PT Goals Patient Stated Goal: to go home PT Goal Formulation: With patient Time For Goal Achievement: 04/16/17 Potential to Achieve Goals: Fair    Frequency Min 2X/week   Barriers to discharge Decreased caregiver support lives alone    Co-evaluation               AM-PAC PT "6 Clicks" Daily Activity  Outcome Measure Difficulty turning over in bed (including adjusting bedclothes, sheets and blankets)?:  Unable Difficulty moving from lying on back to sitting on the side of the bed? : Unable Difficulty sitting down on and standing up from a chair with arms (e.g., wheelchair, bedside commode, etc,.)?: Unable Help needed moving to and from a bed to chair (including a wheelchair)?: Total Help needed walking in hospital room?: Total Help needed climbing 3-5 steps with a railing? : Total 6 Click Score: 6    End of Session   Activity Tolerance: Patient limited by pain Patient left: in bed;with call bell/phone within reach;with bed alarm set;with nursing/sitter in room Nurse Communication: Mobility status PT Visit Diagnosis: Pain;Unsteadiness on feet (R26.81);Other abnormalities of gait and mobility (R26.89);Muscle weakness (generalized) (M62.81);History of falling (Z91.81) Pain - Right/Left: Left Pain - part of body: Leg    Time: 4098-1191 PT Time Calculation (min) (ACUTE ONLY): 16 min   Charges:   PT Evaluation $PT Eval Moderate Complexity: 1 Mod     PT G Codes:        [employer],  PT  Acute Rehab Services  [place] 04/02/2017, 10:35 AM

## 2017-04-02 NOTE — Progress Notes (Signed)
Patient ID: ERLA BACCHI, female   DOB: Feb 08, 1925, 84 y.o.   MRN: [id number]     Subjective:  Patient reports pain as mild to moderate.  Patient alert and following commandscomplains of a headache   Objective:   VITALS:   Vitals:   04/02/17 0600 04/02/17 0700 04/02/17 0740 04/02/17 0850  BP: 102/80 103/70  (!) 103/58  Pulse: (!) 101 (!) 107  (!) 112  Resp: (!) 21 15    Temp:   97.6 F (36.4 C)   TempSrc:   Oral   SpO2: 98% 92%    Weight:      Height:        ABD soft Sensation intact distally Dorsiflexion/Plantar flexion intact Incision: dressing C/D/I and no drainage Pain with knee motion Tender to palpation the the bilateral knees  Lab Results  Component Value Date   WBC 12.5 (H) 04/02/2017   HGB 7.9 (L) 04/02/2017   HCT 25.7 (L) 04/02/2017   MCV 103.2 (H) 04/02/2017   PLT 138 (L) 04/02/2017   BMET    Component Value Date/Time   NA 135 04/02/2017 0516   NA 140 09/04/2013 1508   K 3.9 04/02/2017 0516   CL 103 04/02/2017 0516   CO2 20 (L) 04/02/2017 0516   GLUCOSE 110 (H) 04/02/2017 0516   BUN 23 (H) 04/02/2017 0516   BUN 35 (H) 09/04/2013 1508   CREATININE 0.85 04/02/2017 0516   CREATININE 1.24 (H) 11/05/2013 1400   CALCIUM 8.3 (L) 04/02/2017 0516   GFRNONAA 59 (L) 04/02/2017 0516   GFRNONAA 39 (L) 11/05/2013 1400   GFRAA >60 04/02/2017 0516   GFRAA 45 (L) 11/05/2013 1400     Assessment/Plan:     Principal Problem:   Leg pain Active Problems:   Anemia   Chronic kidney disease, stage III (moderate) (HCC)   Atrial fibrillation (HCC)   Left leg pain   Shock (HCC)   Pressure injury of skin   Advance diet Up with therapy Continue plan per cards Continue plan per Medicine WBAT left lower ext NWB right lower ext   DOUGLAS PARRY, BRANDON 04/02/2017, 11:17 AM  Discussed and agree with above.    Marchia Bond, MD Cell [phone]

## 2017-04-03 DIAGNOSIS — E440 Moderate protein-calorie malnutrition: Secondary | ICD-10-CM

## 2017-04-03 LAB — BLOOD GAS, ARTERIAL
Acid-Base Excess: 0.3 mmol/L (ref 0.0–2.0)
BICARBONATE: 24.1 mmol/L (ref 20.0–28.0)
Drawn by: 521601
O2 CONTENT: 4 L/min
O2 Saturation: 99.5 %
PCO2 ART: 37 mmHg (ref 32.0–48.0)
PO2 ART: 130 mmHg — AB (ref 83.0–108.0)
Patient temperature: 98.6
pH, Arterial: 7.43 (ref 7.350–7.450)

## 2017-04-03 LAB — GLUCOSE, CAPILLARY
GLUCOSE-CAPILLARY: 280 mg/dL — AB (ref 65–99)
Glucose-Capillary: 232 mg/dL — ABNORMAL HIGH (ref 65–99)
Glucose-Capillary: 269 mg/dL — ABNORMAL HIGH (ref 65–99)
Glucose-Capillary: 309 mg/dL — ABNORMAL HIGH (ref 65–99)

## 2017-04-03 LAB — BASIC METABOLIC PANEL
Anion gap: 10 (ref 5–15)
BUN: 17 mg/dL (ref 6–20)
CHLORIDE: 105 mmol/L (ref 101–111)
CO2: 21 mmol/L — AB (ref 22–32)
CREATININE: 0.79 mg/dL (ref 0.44–1.00)
Calcium: 8.2 mg/dL — ABNORMAL LOW (ref 8.9–10.3)
GFR calc Af Amer: >60 mL/min (ref >60)
GFR calc non Af Amer: >60 mL/min (ref >60)
Glucose, Bld: 237 mg/dL — ABNORMAL HIGH (ref 65–99)
Potassium: 4.3 mmol/L (ref 3.5–5.1)
SODIUM: 136 mmol/L (ref 135–145)

## 2017-04-03 LAB — CBC
HCT: 24 % — ABNORMAL LOW (ref 36.0–46.0)
Hemoglobin: 7.8 g/dL — ABNORMAL LOW (ref 12.0–15.0)
MCH: 32 pg (ref 26.0–34.0)
MCHC: 32.5 g/dL (ref 30.0–36.0)
MCV: 98.4 fL (ref 78.0–100.0)
Platelets: 100 10*3/uL — ABNORMAL LOW (ref 150–400)
RBC: 2.44 MIL/uL — ABNORMAL LOW (ref 3.87–5.11)
RDW: 18.8 % — AB (ref 11.5–15.5)
WBC: 6.5 10*3/uL (ref 4.0–10.5)

## 2017-04-03 LAB — TYPE AND SCREEN
ABO/RH(D): A NEG
ANTIBODY SCREEN: NEGATIVE
UNIT DIVISION: 0

## 2017-04-03 LAB — BPAM RBC
Blood Product Expiration Date: 201902032359
ISSUE DATE / TIME: 201901272135
Unit Type and Rh: 600

## 2017-04-03 LAB — PROCALCITONIN: Procalcitonin: 0.16 ng/mL

## 2017-04-03 MED ORDER — BOOST PLUS PO LIQD
237.0000 mL | Freq: Three times a day (TID) | ORAL | Status: DC | PRN
  Filled 2017-04-03 (×3): qty 237

## 2017-04-03 MED ORDER — INSULIN ASPART 100 UNIT/ML ~~LOC~~ SOLN
0.0000 [IU] | Freq: Three times a day (TID) | SUBCUTANEOUS | Status: DC
Start: 2017-04-03 — End: 2017-04-06
  Administered 2017-04-03: 8 [IU] via SUBCUTANEOUS
  Administered 2017-04-03: 11 [IU] via SUBCUTANEOUS
  Administered 2017-04-04: 5 [IU] via SUBCUTANEOUS
  Administered 2017-04-04: 3 [IU] via SUBCUTANEOUS
  Administered 2017-04-04: 5 [IU] via SUBCUTANEOUS
  Administered 2017-04-05: 3 [IU] via SUBCUTANEOUS
  Administered 2017-04-05: 5 [IU] via SUBCUTANEOUS
  Administered 2017-04-06: 3 [IU] via SUBCUTANEOUS

## 2017-04-03 MED ORDER — TRAMADOL HCL 50 MG PO TABS
50.0000 mg | ORAL_TABLET | Freq: Four times a day (QID) | ORAL | Status: DC | PRN
  Administered 2017-04-03: 50 mg via ORAL
  Filled 2017-04-03: qty 1

## 2017-04-03 MED ORDER — ACETAMINOPHEN 325 MG PO TABS
650.0000 mg | ORAL_TABLET | Freq: Four times a day (QID) | ORAL | Status: DC | PRN
  Administered 2017-04-03 – 2017-04-06 (×6): 650 mg via ORAL
  Filled 2017-04-03 (×6): qty 2

## 2017-04-03 MED ORDER — ACETAMINOPHEN 650 MG RE SUPP: 650.0000 mg | Freq: Four times a day (QID) | RECTAL | Status: DC | PRN

## 2017-04-03 MED ORDER — HYDROMORPHONE HCL 1 MG/ML IJ SOLN: 0.5000 mg | INTRAMUSCULAR | Status: DC | PRN

## 2017-04-03 MED ORDER — OXYCODONE HCL 5 MG PO TABS: 5.0000 mg | ORAL_TABLET | ORAL | Status: DC | PRN

## 2017-04-03 MED ORDER — INSULIN ASPART 100 UNIT/ML ~~LOC~~ SOLN
0.0000 [IU] | Freq: Every day | SUBCUTANEOUS | Status: DC
  Administered 2017-04-03 – 2017-04-05 (×3): 3 [IU] via SUBCUTANEOUS

## 2017-04-03 MED ORDER — ENSURE ENLIVE PO LIQD
237.0000 mL | Freq: Two times a day (BID) | ORAL | Status: DC
  Administered 2017-04-03 – 2017-04-06 (×6): 237 mL via ORAL

## 2017-04-03 MED ORDER — TRAMADOL HCL 50 MG PO TABS
50.0000 mg | ORAL_TABLET | Freq: Four times a day (QID) | ORAL | Status: DC | PRN
  Administered 2017-04-04: 100 mg via ORAL
  Filled 2017-04-03: qty 2

## 2017-04-03 NOTE — Progress Notes (Signed)
[place] TEAM 1 - Stepdown/ICU TEAM  ALFREDO COLLYMORE  FTD:[id number] DOB: [birth year] DOA: 03/31/2017 PCP: Patient, No Pcp Per    Brief Narrative:  84 year old female SNF resident with history of DM, hypertension, hyperlipidemia, temporal arteritis, COPD, and osteoporosis who presented with a fall and the inability to bear weight.  She was hospitalized and operated on 03/15/17 by Dr. Doreatha Martin for a right femur periprosthetic fracture between her right total knee arthroplasty and right hip hemiarthroplasty.  She had a left knee TKA in the past.  Significant Events: 1/25 admit  1/26 acute hypotension - ICU - pressors  Subjective: The patient is resting comfortably in bed.  She does report bilateral hip pain.  She denies chest pain shortness of breath nausea or vomiting.  She does admit that she has a very poor appetite and is eating very little.  Assessment & Plan:  Shock / Hypotension  1/26 at 4 PM she developed sudden hypotension requiring ICU transfer and vasopressor initiation - ? DH / UTI / chronic steroids - stable at this time - follow in SDU   +UA Awaiting urine culture - on empiric cefepime which will cont for now    Left leg pain X-rays without acute fractures - Ortho following - PT/OT  Chronic kidney disease stage III Creatinine at baseline  Anemia of chronic disease / iron deficiency Hgb baseline prior to this admit ~7.8 - s/p 1U PRBC 1/28 - CT abd/pelvis w/o evidence of bleeding - follow trend    Paroxysmal A. fib Continue metoprolol - continue Eliquis - HR controlled   DM2 CBG climbing in face of increased steroid dosing - adjust tx and follow   Polymyalgia rheumatica on higher dose steroids - will need another taper to her chronic home dose    DVT prophylaxis: Eliquis  Code Status: DNR - NO CODE  Family Communication: spoke w/ granddaughter at bedside   Disposition Plan: SDU   Consultants:  Ortho  PCCM  Antimicrobials:  Cefepime 1/26  >  Objective: Blood pressure (!) 91/53, pulse 81, temperature (!) 97.5 F (36.4 C), temperature source Axillary, resp. rate 15, height 5\' 1"  (1.549 m), weight 67.8 kg (149 lb 7.6 oz), SpO2 97 %.  Intake/Output Summary (Last 24 hours) at 04/03/2017 0837 Last data filed at 04/03/2017 0500 Gross per 24 hour  Intake 1293 ml  Output 450 ml  Net 843 ml   Filed Weights   04/01/17 0000 04/01/17 1800 04/02/17 0500  Weight: 65.3 kg (144 lb) 66.6 kg (146 lb 13.2 oz) 67.8 kg (149 lb 7.6 oz)    Examination: General: No acute respiratory distress Lungs: Clear to auscultation bilaterally without wheezes or crackles Cardiovascular: Irreg irreg - no gallup or rub - no M  Abdomen: Nontender, nondistended, soft, bowel sounds positive, no rebound, no ascites, no appreciable mass Extremities: No significant cyanosis, clubbing, or edema bilateral lower extremities  CBC: Recent Labs  Lab 04/01/17 1743 04/01/17 1940 04/02/17 0516 04/02/17 1107 04/02/17 1619 04/03/17 0145  WBC 13.1* 11.7* 12.5* 10.5 8.4 6.5  NEUTROABS 8.9* 8.4* 8.3* 7.8* 7.2  --   HGB 7.9* 7.7* 7.9* 7.2* 6.5* 7.8*  HCT 25.2* 24.3* 25.7* 22.7* 20.9* 24.0*  MCV 102.9* 102.5* 103.2* 102.7* 103.5* 98.4  PLT 150 147* 138* 131* 100* 062*   Basic Metabolic Panel: Recent Labs  Lab 03/31/17 1604 04/01/17 0608 04/02/17 0516 04/03/17 0145  NA 135 134* 135 136  K 4.2 3.9 3.9 4.3  CL 100* 99* 103 105  CO2 23  21* 20* 21*  GLUCOSE 152* 187* 110* 237*  BUN 29* 25* 23* 17  CREATININE 1.08* 1.10* 0.85 0.79  CALCIUM 8.8* 8.8* 8.3* 8.2*   GFR: Estimated Creatinine Clearance: 41.2 mL/min (by C-G formula based on SCr of 0.79 mg/dL).  Liver Function Tests: Recent Labs  Lab 03/31/17 1604 04/01/17 0608  AST 35 23  ALT 18 15  ALKPHOS 122 110  BILITOT 1.6* 1.5*  PROT 5.9* 5.7*  ALBUMIN 3.1* 2.9*    Coagulation Profile: Recent Labs  Lab 03/31/17 1604  INR 1.95    Cardiac Enzymes: Recent Labs  Lab 04/01/17 0104  04/01/17 0608 04/01/17 1114  TROPONINI <0.03 <0.03 0.03*    HbA1C: Hgb A1c MFr Bld  Date/Time Value Ref Range Status  11/07/2013 03:30 AM 7.3 (H) <5.7 % Final    Comment:    (NOTE)                                                                       According to the ADA Clinical Practice Recommendations for 2011, when HbA1c is used as a screening test:  >=6.5%   Diagnostic of Diabetes Mellitus           (if abnormal result is confirmed) 5.7-6.4%   Increased risk of developing Diabetes Mellitus References:Diagnosis and Classification of Diabetes Mellitus,Diabetes QIWL,7989,21(JHERD 1):S62-S69 and Standards of Medical Care in         Diabetes - 2011,Diabetes EYCX,4481,85 (Suppl 1):S11-S61.  09/04/2013 03:08 PM 7.0 (H) 4.8 - 5.6 % Final    Comment:             Increased risk for diabetes: 5.7 - 6.4          Diabetes: >6.4          Glycemic control for adults with diabetes: <7.0    CBG: Recent Labs  Lab 04/01/17 2202 04/02/17 0733 04/02/17 1141 04/02/17 1629 04/02/17 2052  GLUCAP 219* 95 149* 162* 234*    Recent Results (from the past 240 hour(s))  MRSA PCR Screening     Status: None   Collection Time: 04/01/17  6:06 AM  Result Value Ref Range Status   MRSA by PCR NEGATIVE NEGATIVE Final    Comment:        The GeneXpert MRSA Assay (FDA approved for NASAL specimens only), is one component of a comprehensive MRSA colonization surveillance program. It is not intended to diagnose MRSA infection nor to guide or monitor treatment for MRSA infections.      Scheduled Meds: . famotidine  10 mg Oral BID  . ferrous sulfate  325 mg Oral BID WC  . gabapentin  300 mg Oral Daily   And  . gabapentin  600 mg Oral QHS  . glimepiride  2 mg Oral Q breakfast  . insulin aspart  0-5 Units Subcutaneous QHS  . insulin aspart  0-9 Units Subcutaneous TID WC  . linagliptin  5 mg Oral Daily  . methylPREDNISolone (SOLU-MEDROL) injection  40 mg Intravenous Q12H  . metoprolol  tartrate  12.5 mg Oral BID  . mirtazapine  15 mg Oral QHS  . mupirocin ointment  1 application Topical BID  . sodium chloride flush  3 mL Intravenous Q12H  . ursodiol  300  mg Oral Daily     LOS: 2 days   Cherene Altes, MD [facility] Office  [phone] Pager - Text Page per Amion as per below:  On-Call/Text Page:      [email or web address]      password [email or web address]  If 7PM-7AM, please contact night-coverage [email or web address] Password [facility] 04/03/2017, 8:37 AM

## 2017-04-03 NOTE — Progress Notes (Signed)
Initial Nutrition Assessment  DOCUMENTATION CODES:   Non-severe (moderate) malnutrition in context of acute illness/injury  INTERVENTION:   Ensure Enlive po BID, each supplement provides 350 kcal and 20 grams of protein   NUTRITION DIAGNOSIS:   Moderate Malnutrition related to acute illness(recent hip fracture s/p repair) as evidenced by energy intake < or equal to 50% for > or equal to 5 days, mild muscle depletion, edema.  GOAL:   Patient will meet greater than or equal to 90% of their needs  MONITOR:   PO intake, Supplement acceptance, Labs, Weight trends  REASON FOR ASSESSMENT:   Malnutrition Screening Tool    ASSESSMENT:   84 yo female admitted with leg pain s/p fall, developed shock/hypotension. Noted pt with hx of ORIF of right remir on 03/15/16. Pt with hx of DM, HTN, HLD, COPD  Recorded po intake 20-30%. Pt with poor appetite, ate bites of egg this AM, drinking on Boost Plus upon visit.  Pt with poor appetite PTA as well. Pt has been at rehab for 2-3 weeks following hospitalization. At rehab, pt has been eating bites/sips here and there, maybe 1/2 cup of something at a meal time; family reports pt was not eating anything that could be considered a "meal." Pt likes ensure but family report she was not receiving at rehab.   Per weight encounters, no major changes in weight recently. Pt reports UBW around 150 pounds, current wt 149 pounds. Noted pt with some mild edema, unsure of dry weight.   Labs: CBGs 95-234 Meds: LR at 75 ml/hr, solumedrol, remeron  NUTRITION - FOCUSED PHYSICAL EXAM:    Most Recent Value  Orbital Region  No depletion  Upper Arm Region  No depletion  Thoracic and Lumbar Region  No depletion  Buccal Region  No depletion  Temple Region  Mild depletion  Clavicle Bone Region  Mild depletion  Clavicle and Acromion Bone Region  Mild depletion  Scapular Bone Region  Mild depletion  Dorsal Hand  No depletion  Patellar Region  Unable to assess   Anterior Thigh Region  Unable to assess  Posterior Calf Region  Unable to assess  Edema (RD Assessment)  Mild       Diet Order:  Diet Carb Modified Fluid consistency: Thin; Room service appropriate? Yes  EDUCATION NEEDS:   No education needs have been identified at this time  Skin:  Skin Assessment: Skin Integrity Issues: Skin Integrity Issues:: Stage II, Incisions Stage II: coccyx Incisions: leg (closed)  Last BM:  1/25  Height:   Ht Readings from Last 1 Encounters:  04/01/17 5\' 1"  (1.549 m)    Weight:   Wt Readings from Last 1 Encounters:  04/02/17 149 lb 7.6 oz (67.8 kg)    Ideal Body Weight:     BMI:  Body mass index is 28.24 kg/m.  Estimated Nutritional Needs:   Kcal:  1350-1550 kcals  Protein:  68-78 g  Fluid:  >/= 1.5 L   Kerman Passey MS, RD, LDN, CNSC [phone] Pager  [phone] Weekend/On-Call Pager

## 2017-04-04 LAB — CBC
HCT: 25 % — ABNORMAL LOW (ref 36.0–46.0)
Hemoglobin: 8.1 g/dL — ABNORMAL LOW (ref 12.0–15.0)
MCH: 32.4 pg (ref 26.0–34.0)
MCHC: 32.4 g/dL (ref 30.0–36.0)
MCV: 100 fL (ref 78.0–100.0)
Platelets: 122 10*3/uL — ABNORMAL LOW (ref 150–400)
RBC: 2.5 MIL/uL — ABNORMAL LOW (ref 3.87–5.11)
RDW: 19.5 % — ABNORMAL HIGH (ref 11.5–15.5)
WBC: 7.1 10*3/uL (ref 4.0–10.5)

## 2017-04-04 LAB — COMPREHENSIVE METABOLIC PANEL
ALK PHOS: 86 U/L (ref 38–126)
ALT: 13 U/L — AB (ref 14–54)
AST: 18 U/L (ref 15–41)
Albumin: 2.4 g/dL — ABNORMAL LOW (ref 3.5–5.0)
Anion gap: 7 (ref 5–15)
BILIRUBIN TOTAL: 1.1 mg/dL (ref 0.3–1.2)
BUN: 23 mg/dL — AB (ref 6–20)
CHLORIDE: 106 mmol/L (ref 101–111)
CO2: 25 mmol/L (ref 22–32)
CREATININE: 0.93 mg/dL (ref 0.44–1.00)
Calcium: 8.4 mg/dL — ABNORMAL LOW (ref 8.9–10.3)
GFR calc Af Amer: >60 mL/min (ref >60)
GFR, EST NON AFRICAN AMERICAN: 53 mL/min — AB (ref >60)
Glucose, Bld: 267 mg/dL — ABNORMAL HIGH (ref 65–99)
Potassium: 4.3 mmol/L (ref 3.5–5.1)
Sodium: 138 mmol/L (ref 135–145)
TOTAL PROTEIN: 4.9 g/dL — AB (ref 6.5–8.1)

## 2017-04-04 LAB — GLUCOSE, CAPILLARY
GLUCOSE-CAPILLARY: 236 mg/dL — AB (ref 65–99)
GLUCOSE-CAPILLARY: 238 mg/dL — AB (ref 65–99)
GLUCOSE-CAPILLARY: 237 mg/dL — AB (ref 65–99)
Glucose-Capillary: 254 mg/dL — ABNORMAL HIGH (ref 65–99)

## 2017-04-04 MED ORDER — CEFTRIAXONE SODIUM 1 G IJ SOLR
1.0000 g | INTRAMUSCULAR | Status: DC
  Administered 2017-04-04: 1 g via INTRAVENOUS
  Filled 2017-04-04: qty 10

## 2017-04-04 MED ORDER — PREDNISONE 20 MG PO TABS
20.0000 mg | ORAL_TABLET | Freq: Two times a day (BID) | ORAL | Status: DC
  Administered 2017-04-04 – 2017-04-05 (×2): 20 mg via ORAL
  Filled 2017-04-04 (×3): qty 1

## 2017-04-04 MED ORDER — CEPHALEXIN 500 MG PO CAPS
500.0000 mg | ORAL_CAPSULE | Freq: Two times a day (BID) | ORAL | Status: DC
  Administered 2017-04-04 – 2017-04-06 (×4): 500 mg via ORAL
  Filled 2017-04-04 (×4): qty 1

## 2017-04-04 MED ORDER — LOPERAMIDE HCL 2 MG PO CAPS
2.0000 mg | ORAL_CAPSULE | ORAL | Status: DC | PRN
  Administered 2017-04-04: 2 mg via ORAL
  Filled 2017-04-04: qty 1

## 2017-04-04 NOTE — Progress Notes (Signed)
Physical Therapy Treatment Patient Details Name: [name] MRN: [id number] DOB: [birth year] Today's Date: 04/04/2017    History of Present Illness This is a 84 year old diabetic who suffered from a fall requiring ORIF of the right femur on 1/9 she fell again while in rehab and was complaining of leg pain and was admitted to the hospital. No new fracture was found. Rapid response was called on1/26 for hypotension. She has a positive urinalysis. PMH includes: DM1, osteoporosis, TIA, COPD, CHF, BTKA, LTHA, lumbar lam.     PT Comments    Pt performed increased activity and progress activity to functional transfer to chair.  Pt is now B WBAT which improves function.  Pt on arrival soiled from [facility] and required multiple bouts of rolling to clean patient of bowel incontinence.  Plan for SNF remains appropriate.    BP pre tx 101/61 BP post tx 117/73    Follow Up Recommendations  SNF;Supervision/Assistance - 24 hour     Equipment Recommendations  None recommended by PT    Recommendations for Other Services       Precautions / Restrictions Precautions Precautions: Fall Restrictions Weight Bearing Restrictions: Yes RLE Weight Bearing: Weight bearing as tolerated LLE Weight Bearing: Weight bearing as tolerated Other Position/Activity Restrictions: New order in chart reports B WBAT    Mobility  Bed Mobility Overal bed mobility: Needs Assistance Bed Mobility: Supine to Sit;Rolling Rolling: Mod assist;+2 for physical assistance   Supine to sit: Mod assist;+2 for physical assistance     General bed mobility comments: Pt required assistance to advance B LEs to edge of bed and to elevate trunk into sitting.  Pt required cues for scooting to edge of bed.    Transfers Overall transfer level: Needs assistance Equipment used: 2 person hand held assist Transfers: Squat Pivot Transfers Sit to Stand: +2 physical assistance;Max assist         General transfer comment: Pt required  Max +2 for squat pivot to drop arm recliner chair to her L side. +2 assistance for lift to chair.   Once in chair patient able to scoot back in recliner unassisted.    Ambulation/Gait Ambulation/Gait assistance: (NT patient unable today.  )               Stairs            Wheelchair Mobility    Modified Rankin (Stroke Patients Only)       Balance Overall balance assessment: Needs assistance   Sitting balance-Leahy Scale: Fair       Standing balance-Leahy Scale: Poor                              Cognition Arousal/Alertness: Awake/alert Behavior During Therapy: WFL for tasks assessed/performed;Anxious Overall Cognitive Status: Within Functional Limits for tasks assessed                                 General Comments: Less anxious during session.  Unaware she had a BM.        Exercises General Exercises - Lower Extremity Ankle Circles/Pumps: AROM;Both;Supine;20 reps Long Arc Quad: AAROM;Both;10 reps;Seated Hip ABduction/ADduction: AROM;Both;10 reps;Seated(pillow squeeze isometric holds.  )    General Comments        Pertinent Vitals/Pain Pain Assessment: Faces Faces Pain Scale: Hurts even more Pain Location: L>R Pain Descriptors / Indicators: Discomfort;Grimacing Pain Intervention(s): Monitored during session;Repositioned  Home Living                      Prior Function            PT Goals (current goals can now be found in the care plan section) Acute Rehab PT Goals Patient Stated Goal: to go home Potential to Achieve Goals: Fair Progress towards PT goals: Progressing toward goals    Frequency    Min 2X/week      PT Plan Current plan remains appropriate    Co-evaluation              AM-PAC PT "6 Clicks" Daily Activity  Outcome Measure  Difficulty turning over in bed (including adjusting bedclothes, sheets and blankets)?: Unable Difficulty moving from lying on back to sitting on the  side of the bed? : Unable Difficulty sitting down on and standing up from a chair with arms (e.g., wheelchair, bedside commode, etc,.)?: Unable Help needed moving to and from a bed to chair (including a wheelchair)?: Total Help needed walking in hospital room?: Total Help needed climbing 3-5 steps with a railing? : Total 6 Click Score: 6    End of Session Equipment Utilized During Treatment: Gait belt Activity Tolerance: Patient limited by pain Patient left: in bed;with call bell/phone within reach;with bed alarm set;with nursing/sitter in room Nurse Communication: Mobility status PT Visit Diagnosis: Pain;Unsteadiness on feet (R26.81);Other abnormalities of gait and mobility (R26.89);Muscle weakness (generalized) (M62.81);History of falling (Z91.81) Pain - Right/Left: Left Pain - part of body: Leg     Time: 9532-0233 PT Time Calculation (min) (ACUTE ONLY): 31 min  Charges:  $Therapeutic Activity: 23-37 mins                    G Codes:       Governor Rooks, PTA pager [phone]    Cristela Blue 04/04/2017, 3:13 PM

## 2017-04-04 NOTE — NC FL2 (Signed)
[place] LEVEL OF CARE SCREENING TOOL     IDENTIFICATION  Patient Name: [name] Birthdate: Mar 12, 1924 Sex: female Admission Date (Current Location): 03/31/2017  [facility] and [place] Number:  Herbalist and Address:  [facility] [place] [address] [place], [place] [address]      Provider Number: [id number]  Attending Physician Name and Address:  Cherene Altes, MD  Relative Name and Phone Number:       Current Level of Care: Hospital Recommended Level of Care: [place] Prior Approval Number:    Date Approved/Denied:   PASRR Number:  [id number]  Discharge Plan: SNF    Current Diagnoses: Patient Active Problem List   Diagnosis Date Noted  . Malnutrition of moderate degree 04/03/2017  . Pressure injury of skin 04/02/2017  . Shock ([place]) 04/01/2017  . Left leg pain 03/31/2017  . Periprosthetic fracture around internal prosthetic right hip joint ([place]) 03/17/2017  . Atrial fibrillation ([place]) 03/13/2017  . Closed fracture of condyle of right femur ([place]) 03/13/2017  . Type 2 diabetes mellitus with renal manifestations ([place]) 03/13/2017  . Femur fracture ([place]) 03/13/2017  . Atrophic vaginitis 12/11/2013  . UTI (lower urinary tract infection) 11/16/2013  . Acute encephalopathy 11/16/2013  . FTT (failure to thrive) in adult 11/16/2013  . Subdural hematoma ([place]) 11/06/2013  . Fall 11/06/2013  . Hematoma of leg 09/04/2013  . Generalized osteoarthritis of hand 09/04/2013  . Cerumen impaction 09/04/2013  . Rales 01/15/2013  . Chronic kidney disease, stage III (moderate) ([place]) 12/19/2012  . Sinus congestion 10/24/2012  . Fracture of femoral neck, right, closed ([place]) 08/02/2012  . Hemoperitoneum (nontraumatic) 06/19/2012  . Malignant neoplasm of renal pelvis ([place]) 06/19/2012  . Dysphagia, unspecified(787.20) 06/19/2012  . Urinary frequency 06/19/2012  . Unspecified late effects of cerebrovascular disease  06/19/2012  . Personal history of fall 06/19/2012  . Abnormality of gait 06/19/2012  . Synovial cyst of popliteal space 06/19/2012  . Muscle weakness (generalized) 06/19/2012  . Osteoporosis, unspecified 06/19/2012  . Anxiety state, unspecified 06/19/2012  . Osteoarthrosis, unspecified whether generalized or localized, unspecified site 06/19/2012  . Leg pain 06/19/2012  . Knee joint replacement by other means 06/19/2012  . Pain in joint, lower leg 06/19/2012  . Hyperlipidemia 06/19/2012  . Cervicalgia 06/19/2012  . Lack of coordination 06/19/2012  . Pain in joint, shoulder region 06/19/2012  . Dermatophytosis of nail 06/19/2012  . Unspecified urinary incontinence 06/19/2012  . Herpes zoster without mention of complication 50/93/2671  . Unspecified hereditary and idiopathic peripheral neuropathy 06/19/2012  . Osteoarthrosis, unspecified whether generalized or localized, lower leg 06/19/2012  . Lumbago 06/19/2012  . Enthesopathy of hip region 06/19/2012  . Internal hemorrhoids without mention of complication 24/58/0998  . Diaphragmatic hernia without mention of obstruction or gangrene 06/19/2012  . Renal abscess, right 06/11/2012  . Recurrent UTI 06/07/2012  . SIRS (systemic inflammatory response syndrome) ([place]) 06/07/2012  . Retroperitoneal bleed 03/20/2012  . Chronic heart failure ([place]) 03/20/2012  . 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 Degenerative arthritis of cervical spine 02/09/2011  . Degenerative arthritis of hip 02/09/2011  . Degenerative arthritis of knee 02/09/2011  . Carpal tunnel syndrome of right wrist 02/09/2011  . TIA (transient ischemic attack) 02/09/2011  . Anemia 01/31/2011  . Hypoxia 01/31/2011  . Renal oncocytoma 01/12/2011  . Type II or unspecified type diabetes mellitus with renal manifestations, uncontrolled(250.42) 01/11/2011  . Essential hypertension 01/11/2011  . Polymyalgia rheumatica ([place]) 01/11/2011  .  Depression 01/11/2011     Orientation RESPIRATION BLADDER Height & Weight     Self, Situation, Place  Normal Incontinent, External catheter Weight: 159 lb 2.8 oz (72.2 kg) Height:  5\' 1"  (154.9 cm)  BEHAVIORAL SYMPTOMS/MOOD NEUROLOGICAL BOWEL NUTRITION STATUS      Continent Diet(carb modified)  AMBULATORY STATUS COMMUNICATION OF NEEDS Skin   Extensive Assist Verbally PU Stage and Appropriate Care   PU Stage 2 Dressing: (foam dressing changes ever 3 days)                   Personal Care Assistance Level of Assistance  Bathing, Dressing Bathing Assistance: Maximum assistance Feeding assistance: Limited assistance Dressing Assistance: Maximum assistance     Functional Limitations Info             SPECIAL CARE FACTORS FREQUENCY  PT (By licensed PT), OT (By licensed OT)     PT Frequency: 5/wk OT Frequency: 5/wk            Contractures      Additional Factors Info  Code Status, Allergies, Isolation Precautions Code Status Info: DNR Allergies Info: Allegra Fexofenadine Hcl, Levaquin Levofloxacin In D5w, Tussionex Pennkinetic Er Hydrocod Polst-cpm Polst Er, Codeine, Morphine And Related   Insulin Sliding Scale Info: 4/day Isolation Precautions Info: OMDRO     Current Medications (04/04/2017):  This is the current hospital active medication list Current Facility-Administered Medications  Medication Dose Route Frequency Provider Last Rate Last Dose  . acetaminophen (TYLENOL) tablet 650 mg  650 mg Oral Q6H PRN Cherene Altes, MD   650 mg at 04/04/17 9622  . cefTRIAXone (ROCEPHIN) 1 g in dextrose 5 % 50 mL IVPB  1 g Intravenous Q24H Cherene Altes, MD 100 mL/hr at 04/04/17 0948 1 g at 04/04/17 0948  . famotidine (PEPCID) tablet 10 mg  10 mg Oral BID Jani Gravel, MD   10 mg at 04/04/17 2979  . feeding supplement (ENSURE ENLIVE) (ENSURE ENLIVE) liquid 237 mL  237 mL Oral BID BM Cherene Altes, MD   237 mL at 04/04/17 0940  . ferrous sulfate tablet 325 mg  325 mg Oral BID WC Jani Gravel, MD   325 mg at 04/04/17 0818  . gabapentin (NEURONTIN) capsule 300 mg  300 mg Oral Daily Jani Gravel, MD   300 mg at 04/04/17 8921   And  . gabapentin (NEURONTIN) capsule 600 mg  600 mg Oral Loma Sousa, MD   600 mg at 04/03/17 2141  . glimepiride (AMARYL) tablet 2 mg  2 mg Oral Q breakfast Jani Gravel, MD   2 mg at 04/04/17 0818  . insulin aspart (novoLOG) injection 0-15 Units  0-15 Units Subcutaneous TID WC Cherene Altes, MD   5 Units at 04/04/17 0818  . insulin aspart (novoLOG) injection 0-5 Units  0-5 Units Subcutaneous QHS Cherene Altes, MD   3 Units at 04/03/17 2139  . linagliptin (TRADJENTA) tablet 5 mg  5 mg Oral Daily Jani Gravel, MD   5 mg at 04/04/17 1941  . metoprolol tartrate (LOPRESSOR) injection 2.5-5 mg  2.5-5 mg Intravenous Q3H PRN Rigoberto Noel, MD   2.5 mg at 04/02/17 1135  . metoprolol tartrate (LOPRESSOR) tablet 12.5 mg  12.5 mg Oral BID Caren Griffins, MD   12.5 mg at 04/04/17 0937  . mirtazapine (REMERON) tablet 15 mg  15 mg Oral QHS Jani Gravel, MD   15 mg at 04/03/17 2142  . mupirocin ointment (BACTROBAN) 2 %  1 application  1 application Topical BID Jani Gravel, MD   1 application at 27/07/86 [phone]  . oxybutynin (DITROPAN) tablet 5 mg  5 mg Oral TID PRN Jani Gravel, MD      . predniSONE (DELTASONE) tablet 20 mg  20 mg Oral BID WC Cherene Altes, MD      . senna [facility]) tablet 8.6 mg  1 tablet Oral Daily PRN Jani Gravel, MD      . traMADol Veatrice Bourbon) tablet 50-100 mg  50-100 mg Oral Q6H PRN Cherene Altes, MD   100 mg at 04/04/17 9201  . ursodiol (ACTIGALL) capsule 300 mg  300 mg Oral Daily Jani Gravel, MD   300 mg at 04/04/17 0071     Discharge Medications: Please see discharge summary for a list of discharge medications.  Relevant Imaging Results:  Relevant Lab Results:   Additional Information SS#: [id number]  Ayron Fillinger, [place], [place]

## 2017-04-04 NOTE — Clinical Social Work Note (Signed)
Clinical Social Work Assessment  Patient Details  Name: [name] MRN: [id number] Date of Birth: 06/16/1924  Date of referral:  04/04/17               Reason for consult:  Facility Placement                Permission sought to share information with:  Chartered certified accountant granted to share information::  Yes, Verbal Permission Granted  Name::     Nutritional therapist::  SNF  Relationship::  granddaughter  Contact Information:     Housing/Transportation Living arrangements for the past 2 months:  Single Family Home Source of Information:    Patient Interpreter Needed:  None Criminal Activity/Legal Involvement Pertinent to Current Situation/Hospitalization:  No - Comment as needed Significant Relationships:  Other Family Members Lives with:  Self Do you feel safe going back to the place where you live?  No Need for family participation in patient care:  Yes (Comment)(help with decision making)  Care giving concerns:  Pt lives at home alone- has been at SNF past 2 weeks but not improved enough to return home alone- granddaughter not happy with care at [place].   Social Worker assessment / plan:  CSW spoke with pt granddaughter concerning plan for time of DC.  [name] confirmed that patient was staying at [facility] but that after fall that brought patient back in [place] does not feel comfortable with patient returning there.  CSW explained SNF referral process and answered questions regarding Medicare days.  Employment status:  Retired Nurse, adult PT Recommendations:  [place] / Referral to community resources:  [place]  Patient/Family's Response to care:  Patient family agreeable to pt continuing to get rehab but would like patient transferred to another facility.  Patient/Family's Understanding of and Emotional Response to Diagnosis, Current Treatment, and Prognosis:  Family  seems to have good understanding of pt condition- hopeful that continued rehab can help return patient to prior level of functioning.  Emotional Assessment Appearance:  Appears stated age Attitude/Demeanor/Rapport:    Affect (typically observed):    Orientation:  Oriented to Self, Oriented to Place, Oriented to Situation Alcohol / Substance use:  Not Applicable Psych involvement (Current and /or in the community):  No (Comment)  Discharge Needs  Concerns to be addressed:  Care Coordination Readmission within the last 30 days:  Yes Current discharge risk:  Physical Impairment, Lives alone Barriers to Discharge:  Continued Medical Work up   Jorge Ny, LCSW 04/04/2017, 11:13 AM

## 2017-04-04 NOTE — Progress Notes (Signed)
Orthopaedic Trauma Progress Note  S: Patient resting comfortably. Doing better according to bedside nurse.   O:  Vitals:   04/04/17 0700 04/04/17 0810  BP: 100/63   Pulse:    Resp: 14   Temp:  (!) 97.4 F (36.3 C)  SpO2:     Gen: Resting RLE: Incisions clean, dry and intact. Compartments soft and compressible. Neurovascularly intact  Labs:  CBC    Component Value Date/Time   WBC 7.1 04/04/2017 0238   RBC 2.50 (L) 04/04/2017 0238   HGB 8.1 (L) 04/04/2017 0238   HGB 12.2 04/04/2006 1039   HCT 25.0 (L) 04/04/2017 0238   HCT 37.4 04/04/2006 1039   PLT 122 (L) 04/04/2017 0238   PLT 181 04/04/2006 1039   MCV 100.0 04/04/2017 0238   MCV 94.8 05/30/2012 1806   MCV 99.3 04/04/2006 1039   MCH 32.4 04/04/2017 0238   MCHC 32.4 04/04/2017 0238   RDW 19.5 (H) 04/04/2017 0238   RDW 16.7 (H) 01/15/2013 1603   RDW 12.2 04/04/2006 1039   LYMPHSABS 0.9 04/02/2017 1619   LYMPHSABS 2.3 01/15/2013 1603   LYMPHSABS 2.1 04/04/2006 1039   MONOABS 0.3 04/02/2017 1619   MONOABS 0.7 04/04/2006 1039   EOSABS 0.0 04/02/2017 1619   EOSABS 0.2 01/15/2013 1603   BASOSABS 0.0 04/02/2017 1619   BASOSABS 0.0 01/15/2013 1603   BASOSABS 0.1 04/04/2006 1039     A/P: 84 year old female with history of CHF, diabetes, A. fib on Eliquis with a right periprosthetic femoral shaft fracture.  Status post ORIF on 03/15/2017, readmitted for fall  -Reviewed imaging. No new fracture. Implants stable -Patient was seen in clinic last week and appeared over medicated and drowsy -PT/OT -Okay with WBAT BLE (although patient may not want to put weight on right leg) -Follow up with me in 3-4 weeks -Call with questions  Shona Needles, MD [facility] [phone] (phone)

## 2017-04-04 NOTE — Progress Notes (Signed)
Inpatient Diabetes Program Recommendations  AACE/ADA: New Consensus Statement on Inpatient Glycemic Control (2015)  Target Ranges:  Prepandial:   less than 140 mg/dL      Peak postprandial:   less than 180 mg/dL (1-2 hours)      Critically ill patients:  140 - 180 mg/dL   Results for CLEMENCIA, HELZER (MRN [id number]) as of 04/04/2017 10:31  Ref. Range 04/03/2017 08:39 04/03/2017 11:42 04/03/2017 17:48 04/03/2017 21:33 04/04/2017 08:03  Glucose-Capillary Latest Ref Range: 65 - 99 mg/dL 232 (H) 269 (H) 309 (H) 280 (H) 236 (H)   Review of Glycemic Control  Current orders for Inpatient glycemic control: Novolog 0-15 units TID with meals, Novolog 0-5 units QHs, Tradjenta 5 mg daily, Amaryl 2 mg QAM; Prednisone 20 mg BID  Inpatient Diabetes Program Recommendations: Insulin - Basal: Noted steroids have been changed to PO and decreased. If glucose continues to be consistently greater than 180 mg/dl, please consider ordering Lantus 5 units Q24H.  Thanks, Barnie Alderman, RN, MSN, CDE Diabetes Coordinator Inpatient Diabetes Program [phone] (Team Pager from 8am to 5pm)

## 2017-04-04 NOTE — Progress Notes (Signed)
[place] TEAM 1 - Stepdown/ICU TEAM  [name]  XAJ:[id number] DOB: 1924/10/13 DOA: 03/31/2017 PCP: Patient, No Pcp Per    Brief Narrative:  84 year old female SNF resident with history of DM, hypertension, hyperlipidemia, temporal arteritis, COPD, and osteoporosis who presented with a fall and the inability to bear weight.  She was previously hospitalized and operated on 03/15/17 by Dr. Doreatha Martin for a right femur periprosthetic fracture between her right total knee arthroplasty and right hip hemiarthroplasty.  She had a left knee TKA in the past.  The day after her admission she suffered an unexpected hypotensive spell, requiring transfer to the ICU for pressors.    Significant Events: 1/25 admit  1/26 acute hypotension - ICU - pressors 1/28 TRH resumed care   Subjective: The patient is sleeping comfortably in bed.  She easily awakens.  She complains of "not feeling good" but denies chest pain shortness of breath fevers chills nausea or vomiting.  She explains to me that her legs are both achy and hurting.  Assessment & Plan:  Shock / Hypotension  1/26 at 4 PM she developed sudden hypotension requiring ICU transfer and vasopressor initiation - felt to be related to combination of DH, UTI, and chronic steroid use - BP now stable - stop IVF and follow on oral intake only (high risk for repeat [facility])  UTI  urine culture does not appear to have been sent - on empiric cefepime - narrow to rocephin and follow - plan to complete 10 days of tx as I am suspicious she may have actually been bacteremic too (and unfortunately blood cx were not sent)  - old urine cx results all + for E coli susceptible to rocephin   Left leg pain X-rays without acute fractures - Ortho following - PT/OT to cont to work w/ pt   Chronic kidney disease stage III Creatinine presently stable, but is very susceptible to fluctuations w/ DH - follow on oral intake only   Anemia of chronic disease / iron deficiency Hgb  baseline prior to this admit ~7.8 - s/p 1U PRBC 1/28 - CT abd/pelvis w/o evidence of bleeding - Hgb holding steady for now   Paroxysmal A. fib HR well controlled presently on metoprolol - continue Eliquis  DM2 CBG poorly controlled, due to high dose steroid - adjust insulin tx - cont to wean steroid    Polymyalgia rheumatica Begin to taper steroid back toward usual home dose of 3mg  QD  Moderate malnutrition in context of acute illness/injury Very poor appetite -she is being provided with dietary supplements -I have encouraged her to eat as much as she possibly can  DVT prophylaxis: Eliquis  Code Status: DNR - NO CODE  Family Communication: no family present at time of exam today  Disposition Plan: Stable for transfer to telemetry bed -PT/OT -begin to make arrangements for placement  Consultants:  Ortho  PCCM  Antimicrobials:  Cefepime 1/26 > 1/29 Rocephin 1/29 >  Objective: Blood pressure 100/63, pulse 64, temperature (!) 97.4 F (36.3 C), temperature source Oral, resp. rate 14, height 5\' 1"  (1.549 m), weight 72.2 kg (159 lb 2.8 oz), SpO2 92 %.  Intake/Output Summary (Last 24 hours) at 04/04/2017 0859 Last data filed at 04/04/2017 0700 Gross per 24 hour  Intake 2380 ml  Output 1101 ml  Net 1279 ml   Filed Weights   04/01/17 1800 04/02/17 0500 04/04/17 0300  Weight: 66.6 kg (146 lb 13.2 oz) 67.8 kg (149 lb 7.6 oz) 72.2 kg (159  lb 2.8 oz)    Examination: General: No acute respiratory distress Lungs: Clear to auscultation bilaterally - no wheeze or crackles  Cardiovascular: regular rate - no M or rub  Abdomen: NT/ND, soft, bs+, no mass  Extremities: trace edema B LE   CBC: Recent Labs  Lab 04/01/17 1743 04/01/17 1940 04/02/17 0516 04/02/17 1107 04/02/17 1619 04/03/17 0145 04/04/17 0238  WBC 13.1* 11.7* 12.5* 10.5 8.4 6.5 7.1  NEUTROABS 8.9* 8.4* 8.3* 7.8* 7.2  --   --   HGB 7.9* 7.7* 7.9* 7.2* 6.5* 7.8* 8.1*  HCT 25.2* 24.3* 25.7* 22.7* 20.9* 24.0* 25.0*    MCV 102.9* 102.5* 103.2* 102.7* 103.5* 98.4 100.0  PLT 150 147* 138* 131* 100* 100* 086*   Basic Metabolic Panel: Recent Labs  Lab 03/31/17 1604 04/01/17 0608 04/02/17 0516 04/03/17 0145 04/04/17 0238  NA 135 134* 135 136 138  K 4.2 3.9 3.9 4.3 4.3  CL 100* 99* 103 105 106  CO2 23 21* 20* 21* 25  GLUCOSE 152* 187* 110* 237* 267*  BUN 29* 25* 23* 17 23*  CREATININE 1.08* 1.10* 0.85 0.79 0.93  CALCIUM 8.8* 8.8* 8.3* 8.2* 8.4*   GFR: Estimated Creatinine Clearance: 36.6 mL/min (by C-G formula based on SCr of 0.93 mg/dL).  Liver Function Tests: Recent Labs  Lab 03/31/17 1604 04/01/17 0608 04/04/17 0238  AST 35 23 18  ALT 18 15 13*  ALKPHOS 122 110 86  BILITOT 1.6* 1.5* 1.1  PROT 5.9* 5.7* 4.9*  ALBUMIN 3.1* 2.9* 2.4*    Coagulation Profile: Recent Labs  Lab 03/31/17 1604  INR 1.95    Cardiac Enzymes: Recent Labs  Lab 04/01/17 0104 04/01/17 0608 04/01/17 1114  TROPONINI <0.03 <0.03 0.03*    HbA1C: Hgb A1c MFr Bld  Date/Time Value Ref Range Status  11/07/2013 03:30 AM 7.3 (H) <5.7 % Final    Comment:    (NOTE)                                                                       According to the ADA Clinical Practice Recommendations for 2011, when HbA1c is used as a screening test:  >=6.5%   Diagnostic of Diabetes Mellitus           (if abnormal result is confirmed) 5.7-6.4%   Increased risk of developing Diabetes Mellitus References:Diagnosis and Classification of Diabetes Mellitus,Diabetes VHQI,6962,95(MWUXL 1):S62-S69 and Standards of Medical Care in         Diabetes - 2011,Diabetes KGMW,1027,25 (Suppl 1):S11-S61.  09/04/2013 03:08 PM 7.0 (H) 4.8 - 5.6 % Final    Comment:             Increased risk for diabetes: 5.7 - 6.4          Diabetes: >6.4          Glycemic control for adults with diabetes: <7.0    CBG: Recent Labs  Lab 04/03/17 0839 04/03/17 1142 04/03/17 1748 04/03/17 2133 04/04/17 0803  GLUCAP 232* 269* 309* 280* 236*     Recent Results (from the past 240 hour(s))  MRSA PCR Screening     Status: None   Collection Time: 04/01/17  6:06 AM  Result Value Ref Range Status   MRSA by PCR NEGATIVE  NEGATIVE Final    Comment:        The GeneXpert MRSA Assay (FDA approved for NASAL specimens only), is one component of a comprehensive MRSA colonization surveillance program. It is not intended to diagnose MRSA infection nor to guide or monitor treatment for MRSA infections.      Scheduled Meds: . famotidine  10 mg Oral BID  . feeding supplement (ENSURE ENLIVE)  237 mL Oral BID BM  . ferrous sulfate  325 mg Oral BID WC  . gabapentin  300 mg Oral Daily   And  . gabapentin  600 mg Oral QHS  . glimepiride  2 mg Oral Q breakfast  . insulin aspart  0-15 Units Subcutaneous TID WC  . insulin aspart  0-5 Units Subcutaneous QHS  . linagliptin  5 mg Oral Daily  . methylPREDNISolone (SOLU-MEDROL) injection  40 mg Intravenous Q12H  . metoprolol tartrate  12.5 mg Oral BID  . mirtazapine  15 mg Oral QHS  . mupirocin ointment  1 application Topical BID  . ursodiol  300 mg Oral Daily     LOS: 3 days   Cherene Altes, MD [facility] Office  [phone] Pager - Text Page per Amion as per below:  On-Call/Text Page:      [email or web address]      password [email or web address]  If 7PM-7AM, please contact night-coverage [email or web address] Password [email or web address] 04/04/2017, 8:59 AM

## 2017-04-05 DIAGNOSIS — I480 Paroxysmal atrial fibrillation: Secondary | ICD-10-CM

## 2017-04-05 DIAGNOSIS — M79606 Pain in leg, unspecified: Secondary | ICD-10-CM

## 2017-04-05 LAB — CBC
HCT: 27.2 % — ABNORMAL LOW (ref 36.0–46.0)
Hemoglobin: 8.7 g/dL — ABNORMAL LOW (ref 12.0–15.0)
MCH: 32.7 pg (ref 26.0–34.0)
MCHC: 32 g/dL (ref 30.0–36.0)
MCV: 102.3 fL — ABNORMAL HIGH (ref 78.0–100.0)
PLATELETS: 131 10*3/uL — AB (ref 150–400)
RBC: 2.66 MIL/uL — AB (ref 3.87–5.11)
RDW: 19.4 % — ABNORMAL HIGH (ref 11.5–15.5)
WBC: 7.8 10*3/uL (ref 4.0–10.5)

## 2017-04-05 LAB — BASIC METABOLIC PANEL
Anion gap: 9 (ref 5–15)
BUN: 25 mg/dL — AB (ref 6–20)
CHLORIDE: 108 mmol/L (ref 101–111)
CO2: 24 mmol/L (ref 22–32)
CREATININE: 0.87 mg/dL (ref 0.44–1.00)
Calcium: 8.9 mg/dL (ref 8.9–10.3)
GFR calc non Af Amer: 57 mL/min — ABNORMAL LOW (ref >60)
Glucose, Bld: 140 mg/dL — ABNORMAL HIGH (ref 65–99)
Potassium: 4.4 mmol/L (ref 3.5–5.1)
Sodium: 141 mmol/L (ref 135–145)

## 2017-04-05 LAB — GLUCOSE, CAPILLARY
GLUCOSE-CAPILLARY: 289 mg/dL — AB (ref 65–99)
Glucose-Capillary: 143 mg/dL — ABNORMAL HIGH (ref 65–99)
Glucose-Capillary: 230 mg/dL — ABNORMAL HIGH (ref 65–99)
Glucose-Capillary: 194 mg/dL — ABNORMAL HIGH (ref 65–99)

## 2017-04-05 MED ORDER — PREDNISONE 20 MG PO TABS
20.0000 mg | ORAL_TABLET | Freq: Every day | ORAL | Status: DC
  Administered 2017-04-06: 20 mg via ORAL
  Filled 2017-04-05: qty 1

## 2017-04-05 MED ORDER — APIXABAN 2.5 MG PO TABS
2.5000 mg | ORAL_TABLET | Freq: Two times a day (BID) | ORAL | Status: DC
  Administered 2017-04-05 – 2017-04-06 (×2): 2.5 mg via ORAL
  Filled 2017-04-05 (×2): qty 1

## 2017-04-05 NOTE — Social Work (Signed)
CSW called [facility] again and spoke with Candace in admissions who indicated that they do not have any SNF beds.  CSW called granddaughter and she indicated that she will f/u with providers for home services to see what the cost would be before she decides on SNF as they would have co-pays to pay for SNF and she is not sure if they can afford.   CSW will continue to follow up.  Elissa Hefty, LCSW Clinical Social Worker [phone]

## 2017-04-05 NOTE — Social Work (Signed)
CSW received a call from granddaugther indicating that she will accept SNF bed from [facility].  CSW confirmed SNF bed offer with [facility].  CSW then faxed clinicals to [employer] to review and initiate [employer] fast streamer.  Elissa Hefty, LCSW Clinical Social Worker [phone]

## 2017-04-05 NOTE — Progress Notes (Signed)
OT Cancellation Note  Patient Details Name: DESERAE JENNINGS MRN: [id number] DOB: 02-Nov-1924   Cancelled Treatment:      Pt is  current D/C plan is SNF [facility], therefore will defer OT to SNF. If OT eval is needed please call Acute Rehab Dept. at [phone] or text page OT at [phone].    Parke Poisson B 04/05/2017, 2:55 PM

## 2017-04-05 NOTE — Progress Notes (Signed)
Inpatient Diabetes Program Recommendations  AACE/ADA: New Consensus Statement on Inpatient Glycemic Control (2015)  Target Ranges:  Prepandial:   less than 140 mg/dL      Peak postprandial:   less than 180 mg/dL (1-2 hours)      Critically ill patients:  140 - 180 mg/dL   Lab Results  Component Value Date   GLUCAP 143 (H) 04/05/2017   HGBA1C 7.3 (H) 11/07/2013    Review of Glycemic Control Results for [name], [name] (MRN [id number]) as of 04/05/2017 11:24  Ref. Range 04/04/2017 08:03 04/04/2017 12:43 04/04/2017 16:00 04/04/2017 22:13 04/05/2017 06:59  Glucose-Capillary Latest Ref Range: 65 - 99 mg/dL 236 (H) 238 (H) 237 (H) 254 (H) 143 (H)   Inpatient Diabetes Program Recommendations:  Noted postprandial hyperglycemia on steroids and nutritional supplements. -While on steroids: -Novolog 3-4 units tid meal coverage if eats 50%  Thank you, Bethena Roys E. Floris Neuhaus, RN, MSN, CDE  Diabetes Coordinator Inpatient Glycemic Control Team Team Pager [phone] (8am-5pm) 04/05/2017 11:26 AM

## 2017-04-05 NOTE — Progress Notes (Signed)
Progress Note  [name]  TMH:[id number] DOB: 04-19-1924 DOA: 03/31/2017 PCP: Patient, No Pcp Per    Brief Narrative:  84 year old female SNF resident with history of DM, hypertension, hyperlipidemia, temporal arteritis, COPD, and osteoporosis who presented with a fall and the inability to bear weight.  She was previously hospitalized and operated on 03/15/17 by Dr. Doreatha Martin for a right femur periprosthetic fracture between her right total knee arthroplasty and right hip hemiarthroplasty.  She had a left knee TKA in the past.  The day after her admission she suffered an unexpected hypotensive spell, requiring transfer to the ICU for pressors.    Significant Events: 1/25 admit  1/26 acute hypotension - ICU - pressors 1/28 TRH resumed care   Subjective: Patient interviewed in the presence of her granddaughter and RN in room.  Feels better compared to yesterday.  Intermittent pain in the lower extremities/knees.  Assessment & Plan:  Shock / Hypotension  1/26 at 4 PM she developed sudden hypotension requiring ICU transfer and vasopressor initiation - felt to be related to combination of DH, UTI, and chronic steroid use - BP now stable - stopped IVF and follow on oral intake only (high risk for repeat [facility]).  Stable.  UTI  urine culture does not appear to have been sent - on empiric cefepime - narrowed to rocephin and follow - plan to complete 10 days of tx as previous Hospitalist colleague was suspicious she may have actually been bacteremic too (and unfortunately blood cx were not sent)  - old urine cx results all + for E coli susceptible to rocephin  Now transitioned to oral Keflex.  Left leg pain X-rays without acute fractures - Ortho following - PT/OT to cont to work w/ pt.  DC to SNF when bed available.  Chronic kidney disease stage III  Creatinine presently stable, but is very susceptible to fluctuations w/ DH - follow on oral intake only   Anemia of chronic disease / iron  deficiency Hgb baseline prior to this admit ~7.8 - s/p 1U PRBC 1/28 - CT abd/pelvis w/o evidence of bleeding - Hgb holding steady for now   Thrombocytopenia -Unclear etiology.  Improving.  Paroxysmal A. fib HR well controlled presently on metoprolol - continue Eliquis, resumed.  DM2 CBG poorly controlled, due to high dose steroid - adjust insulin tx - cont to wean steroid.  Patient also on glimepiride and linagliptin.  Monitor closely.  Polymyalgia rheumatica Begin to taper steroid back toward usual home dose of 3mg  QD  Moderate malnutrition in context of acute illness/injury Very poor appetite -she is being provided with dietary supplements - encouraged her to eat as much as she possibly can  DVT prophylaxis: Eliquis  Code Status: DNR - NO CODE  Family Communication: Discussed in detail with granddaughter at bedside.  Updated care and answered questions. Disposition Plan: Stable for transfer to telemetry bed -PT/OT .  Possible DC to SNF 1/31.  Consultants:  Ortho  PCCM  Antimicrobials:  Cefepime 1/26 > 1/29 Rocephin 1/29 >  Objective:  Vitals:   04/04/17 2331 04/05/17 0434 04/05/17 0500 04/05/17 1500  BP: 127/70 118/68  130/79  Pulse: 66 76  83  Resp: 13 15  15   Temp: (!) 97.5 F (36.4 C) (!) 97 F (36.1 C)  98.9 F (37.2 C)  TempSrc: Oral Oral  Oral  SpO2: 100% 100%  98%  Weight:   74.7 kg (164 lb 10.9 oz)   Height:  Intake/Output Summary (Last 24 hours) at 04/05/2017 1627 Last data filed at 04/05/2017 1332 Gross per 24 hour  Intake 236 ml  Output 0 ml  Net 236 ml   Filed Weights   04/02/17 0500 04/04/17 0300 04/05/17 0500  Weight: 67.8 kg (149 lb 7.6 oz) 72.2 kg (159 lb 2.8 oz) 74.7 kg (164 lb 10.9 oz)    Examination: General: Pleasant elderly female lying comfortably propped up in bed. Lungs: Clear to auscultation bilaterally - no wheeze or crackles.  No increased work of breathing. Cardiovascular: regular rate - no M or rub.  No JVD.  Trace  bilateral ankle edema.  Telemetry: Sinus rhythm. Abdomen: NT/ND, soft, bs+, no mass.  Normal bowel sounds heard. Extremities: trace edema B LE.  Healed surgical scars over both knees.  Healing surgical scar over the lateral aspect of right knee without acute findings. CNS: Alert and oriented.  No focal neurological deficits.  CBC: Recent Labs  Lab 04/01/17 1743 04/01/17 1940 04/02/17 0516 04/02/17 1107 04/02/17 1619 04/03/17 0145 04/04/17 0238 04/05/17 0605  WBC 13.1* 11.7* 12.5* 10.5 8.4 6.5 7.1 7.8  NEUTROABS 8.9* 8.4* 8.3* 7.8* 7.2  --   --   --   HGB 7.9* 7.7* 7.9* 7.2* 6.5* 7.8* 8.1* 8.7*  HCT 25.2* 24.3* 25.7* 22.7* 20.9* 24.0* 25.0* 27.2*  MCV 102.9* 102.5* 103.2* 102.7* 103.5* 98.4 100.0 102.3*  PLT 150 147* 138* 131* 100* 100* 122* 789*   Basic Metabolic Panel: Recent Labs  Lab 04/01/17 0608 04/02/17 0516 04/03/17 0145 04/04/17 0238 04/05/17 0605  NA 134* 135 136 138 141  K 3.9 3.9 4.3 4.3 4.4  CL 99* 103 105 106 108  CO2 21* 20* 21* 25 24  GLUCOSE 187* 110* 237* 267* 140*  BUN 25* 23* 17 23* 25*  CREATININE 1.10* 0.85 0.79 0.93 0.87  CALCIUM 8.8* 8.3* 8.2* 8.4* 8.9   GFR: Estimated Creatinine Clearance: 39.8 mL/min (by C-G formula based on SCr of 0.87 mg/dL).  Liver Function Tests: Recent Labs  Lab 03/31/17 1604 04/01/17 0608 04/04/17 0238  AST 35 23 18  ALT 18 15 13*  ALKPHOS 122 110 86  BILITOT 1.6* 1.5* 1.1  PROT 5.9* 5.7* 4.9*  ALBUMIN 3.1* 2.9* 2.4*    Coagulation Profile: Recent Labs  Lab 03/31/17 1604  INR 1.95    Cardiac Enzymes: Recent Labs  Lab 04/01/17 0104 04/01/17 0608 04/01/17 1114  TROPONINI <0.03 <0.03 0.03*      CBG: Recent Labs  Lab 04/04/17 1600 04/04/17 2213 04/05/17 0659 04/05/17 1235 04/05/17 1621  GLUCAP 237* 254* 143* 230* 194*    Recent Results (from the past 240 hour(s))  MRSA PCR Screening     Status: None   Collection Time: 04/01/17  6:06 AM  Result Value Ref Range Status   MRSA by PCR  NEGATIVE NEGATIVE Final    Comment:        The GeneXpert MRSA Assay (FDA approved for NASAL specimens only), is one component of a comprehensive MRSA colonization surveillance program. It is not intended to diagnose MRSA infection nor to guide or monitor treatment for MRSA infections.      Scheduled Meds: . cephALEXin  500 mg Oral Q12H  . famotidine  10 mg Oral BID  . feeding supplement (ENSURE ENLIVE)  237 mL Oral BID BM  . ferrous sulfate  325 mg Oral BID WC  . gabapentin  300 mg Oral Daily   And  . gabapentin  600 mg Oral QHS  .  glimepiride  2 mg Oral Q breakfast  . insulin aspart  0-15 Units Subcutaneous TID WC  . insulin aspart  0-5 Units Subcutaneous QHS  . linagliptin  5 mg Oral Daily  . metoprolol tartrate  12.5 mg Oral BID  . mirtazapine  15 mg Oral QHS  . mupirocin ointment  1 application Topical BID  . predniSONE  20 mg Oral BID WC  . ursodiol  300 mg Oral Daily     LOS: 4 days   Vernell Leep, MD, FACP, [facility]. [facility] Pager [phone]  If 7PM-7AM, please contact night-coverage [email or web address] Password [facility] 04/05/2017, 4:39 PM

## 2017-04-05 NOTE — Progress Notes (Signed)
Physical Therapy Treatment Patient Details Name: [name] MRN: [id number] DOB: 1925-01-27 Today's Date: 04/05/2017    History of Present Illness This is a 84 year old diabetic who suffered from a fall requiring ORIF of the right femur on 1/9 she fell again while in rehab and was complaining of leg pain and was admitted to the hospital. No new fracture was found. Rapid response was called on1/26 for hypotension. She has a positive urinalysis. PMH includes: DM1, osteoporosis, TIA, COPD, CHF, BTKA, LTHA, lumbar lam.     PT Comments    Patient tolerated sit to stands X3 with use of max A +2 and Stedy. Pt limited by bilat LE pain however willing to participate in therapy. Continue to progress as tolerated with anticipated d/c to SNF for further skilled PT services.     Follow Up Recommendations  SNF;Supervision/Assistance - 24 hour     Equipment Recommendations  None recommended by PT    Recommendations for Other Services       Precautions / Restrictions Precautions Precautions: Fall Restrictions Weight Bearing Restrictions: Yes RLE Weight Bearing: Weight bearing as tolerated LLE Weight Bearing: Weight bearing as tolerated    Mobility  Bed Mobility Overal bed mobility: Needs Assistance Bed Mobility: Supine to Sit     Supine to sit: Mod assist;HOB elevated;+2 for safety/equipment     General bed mobility comments: cues for sequencing and technique; use of bed pad  Transfers Overall transfer level: Needs assistance Equipment used: 2 person hand held assist Transfers: Sit to/from [employer] Sit to Stand: Max assist;+2 physical assistance   Squat pivot transfers: Max assist;+2 physical assistance     General transfer comment: pt stood from EOB, BSC, and Stedy; cues for technique and safe hand placement; assist to power up into standing  Ambulation/Gait                 Stairs            Wheelchair Mobility    Modified Rankin  (Stroke Patients Only)       Balance Overall balance assessment: Needs assistance Sitting-balance support: Bilateral upper extremity supported;Feet supported Sitting balance-Leahy Scale: Fair     Standing balance support: Bilateral upper extremity supported Standing balance-Leahy Scale: Poor                              Cognition Arousal/Alertness: Awake/alert Behavior During Therapy: WFL for tasks assessed/performed;Anxious Overall Cognitive Status: Within Functional Limits for tasks assessed                                        Exercises      General Comments        Pertinent Vitals/Pain Pain Assessment: Faces Faces Pain Scale: Hurts even more Pain Location: bilat LE Pain Descriptors / Indicators: Grimacing;Guarding;Sore Pain Intervention(s): Limited activity within patient's tolerance;Monitored during session;Repositioned    Home Living                      Prior Function            PT Goals (current goals can now be found in the care plan section) Progress towards PT goals: Progressing toward goals    Frequency    Min 2X/week      PT Plan Current plan remains appropriate    Co-evaluation  AM-PAC PT "6 Clicks" Daily Activity  Outcome Measure  Difficulty turning over in bed (including adjusting bedclothes, sheets and blankets)?: Unable Difficulty moving from lying on back to sitting on the side of the bed? : Unable Difficulty sitting down on and standing up from a chair with arms (e.g., wheelchair, bedside commode, etc,.)?: Unable Help needed moving to and from a bed to chair (including a wheelchair)?: Total Help needed walking in hospital room?: Total Help needed climbing 3-5 steps with a railing? : Total 6 Click Score: 6    End of Session Equipment Utilized During Treatment: Gait belt Activity Tolerance: Patient tolerated treatment well Patient left: in chair;with call bell/phone within  reach;with chair alarm set Nurse Communication: Mobility status PT Visit Diagnosis: Pain;Unsteadiness on feet (R26.81);Other abnormalities of gait and mobility (R26.89);Muscle weakness (generalized) (M62.81);History of falling (Z91.81) Pain - Right/Left: Left Pain - part of body: Leg     Time: 7588-3254 PT Time Calculation (min) (ACUTE ONLY): 31 min  Charges:  $Therapeutic Activity: 23-37 mins                    G Codes:       Earney Navy, PTA Pager: [phone]     Darliss Cheney 04/05/2017, 4:29 PM

## 2017-04-05 NOTE — Social Work (Addendum)
CSW contacted grand daughter to discuss SNF offers. Granddaughter indicated that she does not want pt to return to previous SNF and desires her to either go to [facility] or [facility]. CSW explained Insurance Auth needed for placement again, and granddaughter agreed.  CSW contacted both SNF's to see if SNF bed available.  10:50am: CSw left message at SNF-[facility] and requested a call back.  [facility] indicated that patient has used 2.5 weeks of SNF days at another SNF and will have a copay to pay at [facility].    CSW called granddaughter to discuss co-pays and SNF offers from [facility]. Granddaughter indicated that she wanted to know if [facility] can offer a bed first and she will need to discuss patient's finances with other family member before making a decision for SNF vs. Home health.  Elissa Hefty, LCSW Clinical Social Worker [phone]

## 2017-04-05 NOTE — Progress Notes (Signed)
ANTICOAGULATION CONSULT NOTE - Initial Consult  Pharmacy Consult for Eliquis Indication: atrial fibrillation  Allergies  Allergen Reactions  . Allegra [Fexofenadine Hcl]     Unknown reaction  . Levaquin [Levofloxacin In D5w] Other (See Comments)    Unknown reaction  . Tussionex Pennkinetic Er [Hydrocod Polst-Cpm Polst Er] Nausea And Vomiting  . Codeine Nausea And Vomiting  . Morphine And Related Nausea And Vomiting    Patient Measurements: Height: 5\' 1"  (154.9 cm) Weight: 164 lb 10.9 oz (74.7 kg) IBW/kg (Calculated) : 47.8 Heparin Dosing Weight:    Vital Signs: Temp: 98.9 F (37.2 C) (01/30 1500) Temp Source: Oral (01/30 1500) BP: 130/79 (01/30 1500) Pulse Rate: 83 (01/30 1500)  Labs: Recent Labs    04/03/17 0145 04/04/17 0238 04/05/17 0605  HGB 7.8* 8.1* 8.7*  HCT 24.0* 25.0* 27.2*  PLT 100* 122* 131*  CREATININE 0.79 0.93 0.87    Estimated Creatinine Clearance: 39.8 mL/min (by C-G formula based on SCr of 0.87 mg/dL).   Medical History: Past Medical History:  Diagnosis Date  . Abnormality of gait   . Anemia, unspecified   . Anxiety state, unspecified   . Arthritis    BACK , NECK, AND JOINTS  . 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 CHF (congestive heart failure) ([place])   . Chronic cystitis   . Chronic steroid use   . COPD (chronic obstructive pulmonary disease) ([facility])    no inhalers used   . Depressive disorder, not elsewhere classified   . Dermatophytosis of nail   . Diabetic peripheral neuropathy ([place])   . Diaphragmatic hernia without mention of obstruction or gangrene   . Hemoperitoneum (nontraumatic)   . Herpes zoster with other nervous system complications(053.19)   . History of TIA (transient ischemic attack) 2012--  NO RESIDUAL  . HTN (hypertension)   . 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 Insulin dependent diabetes mellitus ([place])   . Macular degeneration of both eyes   . Malignant neoplasm of renal pelvis ([place])   . Osteoporosis    . Osteoporosis, unspecified   . Other and unspecified hyperlipidemia   . Personality change due to conditions classified elsewhere   . PONV (postoperative nausea and vomiting)    in past, none recently  . 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 Temporal arteritis ([place])   . Type I (juvenile type) diabetes mellitus without mention of complication, uncontrolled   . Unspecified essential hypertension   . Unspecified late effects of cerebrovascular disease   . Urinary frequency     Assessment: Anticoag: Eliquis PTA for AFib, admitted s/p fall and no surgery per Ortho - reduced dose may be appropriate (age 77, but wt  > 60 and Scr low). wt was 65 kg on admit, now 75.7kg? SCr stable. Hg was 6.5 on 1/27, now 8.7. Given recent surgery and low hg keep at 2.5mg  po po bid. Could consider back to 5mg  dosing at discharge or later as outpatient. Thrombocytopenia stable - PTA Eliquis 5mg  BID  Goal of Therapy:  Therapeutic oral anticoagulation  Plan:  Eliquis 2.5mg  BID   [place] S. Alford Highland, PharmD, BCPS Clinical Staff Pharmacist Pager [phone]  Eilene Ghazi Stillinger 04/05/2017,5:02 PM

## 2017-04-06 DIAGNOSIS — E1165 Type 2 diabetes mellitus with hyperglycemia: Secondary | ICD-10-CM

## 2017-04-06 LAB — CBC
HEMATOCRIT: 27.2 % — AB (ref 36.0–46.0)
Hemoglobin: 8.6 g/dL — ABNORMAL LOW (ref 12.0–15.0)
MCH: 32.2 pg (ref 26.0–34.0)
MCHC: 31.6 g/dL (ref 30.0–36.0)
MCV: 101.9 fL — AB (ref 78.0–100.0)
PLATELETS: 119 10*3/uL — AB (ref 150–400)
RBC: 2.67 MIL/uL — AB (ref 3.87–5.11)
RDW: 18.7 % — AB (ref 11.5–15.5)
WBC: 7.5 10*3/uL (ref 4.0–10.5)

## 2017-04-06 LAB — GLUCOSE, CAPILLARY
GLUCOSE-CAPILLARY: 172 mg/dL — AB (ref 65–99)
GLUCOSE-CAPILLARY: 90 mg/dL (ref 65–99)

## 2017-04-06 MED ORDER — ACETAMINOPHEN 325 MG PO TABS: 650.0000 mg | ORAL_TABLET | Freq: Four times a day (QID) | ORAL | Status: AC | PRN

## 2017-04-06 MED ORDER — PREDNISONE 10 MG PO TABS: ORAL_TABLET | ORAL | Status: DC

## 2017-04-06 MED ORDER — APIXABAN 2.5 MG PO TABS: 2.5000 mg | ORAL_TABLET | Freq: Two times a day (BID) | ORAL | Status: DC

## 2017-04-06 MED ORDER — GABAPENTIN 300 MG PO CAPS: 300.0000 mg | ORAL_CAPSULE | Freq: Two times a day (BID) | ORAL | Status: AC

## 2017-04-06 MED ORDER — APIXABAN 5 MG PO TABS: 5.0000 mg | ORAL_TABLET | Freq: Two times a day (BID) | ORAL | Status: AC

## 2017-04-06 MED ORDER — TRAMADOL HCL 50 MG PO TABS: 50.0000 mg | ORAL_TABLET | Freq: Two times a day (BID) | ORAL | 0 refills | Status: DC | PRN

## 2017-04-06 MED ORDER — CEPHALEXIN 500 MG PO CAPS: 500.0000 mg | ORAL_CAPSULE | Freq: Two times a day (BID) | ORAL | Status: DC

## 2017-04-06 NOTE — Social Work (Addendum)
CSW received Insurance Auth for patient to go to [facility] and [facility].  CSW advised SNF of same.  CSW will assist with transition to SNF when ready.  DNR on chart to be signed.  11:09am CSW confirmed SNF  bed with granddaughter. Pt will have semi private and placed on list for private. Granddaughter in agreement. CSW also called SNF-[facility] to confirm that they did not have any beds and they do not.   Pt will dc to [facility] today.  Elissa Hefty, LCSW Clinical Social Worker [phone]

## 2017-04-06 NOTE — Clinical Social Work Placement (Signed)
   CLINICAL SOCIAL WORK PLACEMENT  NOTE  Date:  04/06/2017  Patient Details  Name: [name] MRN: [id number] Date of Birth: 1924/09/17  Clinical Social Work is seeking post-discharge placement for this patient at the [facility] level of care (*CSW will initial, date and re-position this form in  chart as items are completed):  Yes   Patient/family provided with [facility] Work Department's list of facilities offering this level of care within the geographic area requested by the patient (or if unable, by the patient's family).  Yes   Patient/family informed of their freedom to choose among providers that offer the needed level of care, that participate in Medicare, Medicaid or managed care program needed by the patient, have an available bed and are willing to accept the patient.  Yes   Patient/family informed of [facility]'s ownership interest in [facility] and [facility], as well as of the fact that they are under no obligation to receive care at these facilities.  PASRR submitted to EDS on       PASRR number received on       Existing PASRR number confirmed on 04/04/17     FL2 transmitted to all facilities in geographic area requested by pt/family on 04/04/17     FL2 transmitted to all facilities within larger geographic area on       Patient informed that his/her managed care company has contracts with or will negotiate with certain facilities, including the following:        Yes   Patient/family informed of bed offers received.  Patient chooses bed at [facility]     Physician recommends and patient chooses bed at      Patient to be transferred to [facility] on 04/06/17.  Patient to be transferred to facility by [facility]     Patient family notified on 04/06/17 of transfer.  Name of family member notified:  granddaughter advised     PHYSICIAN Please sign DNR     Additional Comment:     _______________________________________________ Normajean Baxter, LCSW 04/06/2017, 11:24 AM

## 2017-04-06 NOTE — Discharge Summary (Signed)
Physician Discharge Summary  AUTYM SIESS IRJ:[id number] DOB: Mar 14, 1924  PCP: Patient, No Pcp Per  Admit date: 03/31/2017 Discharge date: 04/06/2017  Recommendations for Outpatient Follow-up:  1. MD at SNF in 4 days with repeat labs (CBC & BMP). 2. Dr. Lennette Bihari Haddix, Orthopedics in 2 weeks. 3. Dr. Lyman Bishop, PCP/Cardiology upon discharge from SNF.  Home Health: None Equipment/Devices: None  Discharge Condition: Improved and stable CODE STATUS: DNR Diet recommendation: Heart healthy & diabetic diet.  Discharge Diagnoses:  Principal Problem:   Leg pain Active Problems:   Anemia   Chronic kidney disease, stage III (moderate) (HCC)   Atrial fibrillation (HCC)   Left leg pain   Shock (HCC)   Pressure injury of skin   Malnutrition of moderate degree   Brief Summary: 84 year old female SNF resident with history of DM, hypertension, hyperlipidemia, temporal arteritis, COPD, and osteoporosis who presented with a fall and the inability to bear weight. She was previously hospitalized and operated on 03/15/17 by Dr. Doreatha Martin for a right femur periprosthetic fracture between her right total knee arthroplasty and right hip hemiarthroplasty. She had a left knee TKA in the past.  The day after her admission she suffered an unexpected hypotensive spell, requiring transfer to the ICU for pressors.    Significant Events: 1/25 admit  1/26 acute hypotension - ICU - pressors 1/28 [facility] resumed care   Assessment & Plan:  Shock / Hypotension  1/26 at 4 PM she developed sudden hypotension requiring ICU transfer and vasopressor initiation - felt to be related to combination of DH, UTI, and ? chronic steroid use Shock resolved for several days now.  Remains at risk for recurrent dehydration due to advanced age, inconsistent oral intake and needs to be closely monitored. Unclear if patient was recently on chronic steroids.  As per medication reconciliation's from prior to admission,  prednisone seemed to have been discontinued on 03/23/17.  Recommend reassessment at SNF and if she was truly on chronic prednisone 3 mg daily recently, after current prednisone taper, she should return to prednisone 3 mg daily.  However if this had been discontinued, then may consider stopping as long as there are no adverse outcomes i.e. hypotension.  UTI  urine culture does not appear to have been sent - on empiric cefepime - narrowed to rocephin -There was some concern that she may have actually been bacteremic too (and unfortunately blood cx were not sent)  - old urine cx results all + for E coli susceptible to rocephin  Now transitioned to oral Keflex and will complete total 10 days antibiotics on 04/10/17.  Left leg pain X-rays without acute fractures - Ortho following - PT/OT to cont to work w/ pt.  DC to SNF for rehab.  Chronic kidney disease stage III  Creatinine presently stable, but is very susceptible to fluctuations w/ DH - follow on oral intake only   Anemia of chronic disease / iron deficiency Hgb baseline prior to this admit ~7.8 - s/p 1U PRBC 1/28 - CT abd/pelvis w/o evidence of bleeding - Hgb holding steady for now   Thrombocytopenia -Unclear etiology.    Fluctuating but stable.  Monitor platelets closely at SNF.  Paroxysmal A. fib HR well controlled presently on metoprolol - continue Eliquis, resumed.    DM2 CBG poorly controlled, due to high dose steroid - cont to wean steroid.  Patient also on glimepiride and linagliptin.  Should improve with tapering of steroids.  Polymyalgia rheumatica Begin to taper steroid back toward usual home  dose of 3mg  QD.  Please see above regarding discussion about steroids.  Moderate malnutrition in context of acute illness/injury Very poor appetite -she is being provided with dietary supplements - encouraged her to eat as much as she possibly can  Status post ORIF of right periprosthetic femoral shaft fracture 03/15/17 Seen by  orthopedics 1/29.  Patient had been seen in clinic last week and the.  Overmedicated and drowsy.  Minimized sedative medications this admission. As per orthopedics, weightbearing as tolerated in bilateral lower extremities although patient may not want to put weight on right leg. Outpatient follow-up with orthopedics.      Consultants:  Ortho  PCCM    Discharge Instructions  Discharge Instructions    Call MD for:  difficulty breathing, headache or visual disturbances   Complete by:  As directed    Call MD for:  extreme fatigue   Complete by:  As directed    Call MD for:  persistant dizziness or light-headedness   Complete by:  As directed    Call MD for:  persistant nausea and vomiting   Complete by:  As directed    Call MD for:  redness, tenderness, or signs of infection (pain, swelling, redness, odor or green/yellow discharge around incision site)   Complete by:  As directed    Call MD for:  severe uncontrolled pain   Complete by:  As directed    Call MD for:  temperature >100.4   Complete by:  As directed    Diet - low sodium heart healthy   Complete by:  As directed    Diet Carb Modified   Complete by:  As directed    Increase activity slowly   Complete by:  As directed        Medication List    STOP taking these medications   DIABETIC TUSSIN DM 10-100 MG/5ML liquid Generic drug:  dextromethorphan-guaiFENesin   omeprazole 20 MG capsule Commonly known as:  PRILOSEC   oxyCODONE-acetaminophen 5-325 MG tablet Commonly known as:  PERCOCET/ROXICET   tiZANidine 4 MG tablet Commonly known as:  ZANAFLEX     TAKE these medications   acetaminophen 325 MG tablet Commonly known as:  TYLENOL Take 2 tablets (650 mg total) by mouth every 6 (six) hours as needed for mild pain, moderate pain, fever or headache. What changed:  reasons to take this   apixaban 5 MG Tabs tablet Commonly known as:  ELIQUIS Take 1 tablet (5 mg total) by mouth 2 (two) times daily.    cephALEXin 500 MG capsule Commonly known as:  KEFLEX Take 1 capsule (500 mg total) by mouth every 12 (twelve) hours. Discontinue after 04/10/2017 doses.   feeding supplement (GLUCERNA SHAKE) Liqd Take 237 mLs by mouth 2 (two) times daily between meals.   ferrous sulfate 325 (65 FE) MG tablet Take 1 tablet (325 mg total) by mouth 2 (two) times daily with a meal.   gabapentin 300 MG capsule Commonly known as:  NEURONTIN Take 1 capsule (300 mg total) by mouth 2 (two) times daily. Taking 1 tab in the am , and 2 tabs in the pm. What changed:  when to take this   glimepiride 2 MG tablet Commonly known as:  AMARYL Take 2 mg by mouth daily with breakfast.   linagliptin 5 MG Tabs tablet Commonly known as:  TRADJENTA Take 1 tablet (5 mg total) by mouth daily.   metoprolol tartrate 25 MG tablet Commonly known as:  LOPRESSOR Take 0.5 tablets (12.5 mg  total) by mouth 2 (two) times daily.   mirtazapine 15 MG tablet Commonly known as:  REMERON Take 15 mg by mouth at bedtime.   mupirocin ointment 2 % Commonly known as:  [place] 1 application into the nose See admin instructions. Apply topically twice daily to open affected areas on back of neck until healed.   oxybutynin 5 MG tablet Commonly known as:  DITROPAN Take 1 tablet (5 mg total) by mouth 3 (three) times daily as needed for bladder spasms (Up to 3 times daily to control bladder spasms.).   predniSONE 10 MG tablet Commonly known as:  DELTASONE Take 2 tablets (20 mg total) daily for 3 days, then 1 tablet daily (10 mg total) for 3 days, then half tablet (5 mg total) daily for 3 days, then as per SNF prior to this admission (if she was on prednisone 3 mg daily, continue same.  If prednisone had been discontinued, then may stop). What changed:    medication strength  how much to take  how to take this  when to take this  additional instructions   ranitidine 150 MG tablet Commonly known as:  ZANTAC Take 150 mg by  mouth daily.   senna 8.6 MG Tabs tablet Commonly known as:  SENOKOT Take 1 tablet by mouth daily as needed (constipation).   traMADol 50 MG tablet Commonly known as:  ULTRAM Take 1 tablet (50 mg total) by mouth every 12 (twelve) hours as needed for severe pain. What changed:    when to take this  reasons to take this   ursodiol 300 MG capsule Commonly known as:  ACTIGALL Take 150 mg by mouth daily.       Contact information for follow-up providers    MD at SNF. Schedule an appointment as soon as possible for a visit in 4 day(s).   Why:  To be seen with repeat labs (CBC & BMP).       Haddix, Thomasene Lot, MD. Schedule an appointment as soon as possible for a visit in 2 week(s).   Specialty:  Orthopedic Surgery Contact information: [address] [place] [address] [phone]        Pixie Casino, MD. Schedule an appointment as soon as possible for a visit.   Specialty:  Cardiology Why:  Upon discharge from SNF. Contact information: [place] [address] [phone]            Contact information for after-discharge care    Destination    [facility] Follow up.   Service:  Skilled Nursing Contact information: [place] [address] [phone]                 Allergies  Allergen Reactions  . Allegra [Fexofenadine Hcl]     Unknown reaction  . Levaquin [Levofloxacin In D5w] Other (See Comments)    Unknown reaction  . Tussionex Pennkinetic Er [Hydrocod Polst-Cpm Polst Er] Nausea And Vomiting  . Codeine Nausea And Vomiting  . Morphine And Related Nausea And Vomiting      Procedures/Studies: Ct Abdomen Pelvis Wo Contrast  Result Date: 04/02/2017 CLINICAL DATA:  Anemia, unexplained. EXAM: CT ABDOMEN AND PELVIS WITHOUT CONTRAST TECHNIQUE: Multidetector CT imaging of the abdomen and pelvis was performed following the standard protocol without IV contrast. COMPARISON:   CT abdomen dated 09/11/2016. FINDINGS: Lower chest: Small patchy consolidations at each lung base, most likely atelectasis. Hepatobiliary: Peripheral contours of the liver  are somewhat nodular suggesting cirrhosis. No focal liver abnormality is seen. Layering stones are seen within the otherwise normal-appearing gallbladder. No bile duct dilatation appreciated. Pancreas: Partially infiltrated with fat but otherwise unremarkable. Spleen: Normal in size without focal abnormality. Adrenals/Urinary Tract: Adrenal glands are unremarkable. Stable chronic left-sided hydronephrosis and parapelvic renal cysts. Right renal cortex scarring, at least some component related to a previous history of cryoablation. Distal ureters and portions of the bladder are obscured by metallic artifact emanating from patient's bilateral hip prostheses, however, no ureteral or bladder stone is identified within the visualized portions. Stomach/Bowel: Diverticulosis of the sigmoid and descending colon but no focal inflammatory change appreciated to suggest acute diverticulitis. Distal rectosigmoid colon is obscured by metallic artifact from patient's hip prostheses. Stomach is unremarkable, partially decompressed limiting characterization. Appendix is normal. Vascular/Lymphatic: No significant vascular findings are present. No enlarged abdominal or pelvic lymph nodes. Reproductive: Uterus mostly obscured by metallic artifact from patient's bilateral hip prostheses. No adnexal mass or free fluid seen, although characterization significantly limited as above. Other: No free fluid or abscess collection identified. No soft tissue mass seen. No free intraperitoneal air. Musculoskeletal: Bilateral hip prostheses in place. Degenerative changes throughout the scoliotic thoracolumbar spine, moderate to severe in degree. Chronic compression fracture deformity at the T12 vertebral body level, status post vertebroplasty. Additional chronic appearing  compression deformity of the L4 vertebral body. No acute or suspicious osseous finding. IMPRESSION: 1. No acute findings within the abdomen or pelvis, although portions of the pelvis are obscured by metallic artifact emanating from patient's bilateral hip prostheses, as detailed above. 2. Colonic diverticulosis without evidence of acute diverticulitis. No bowel obstruction or evidence of bowel wall inflammation seen. Again, lower rectosigmoid colon is partially obscured by the metallic artifact from patient's hip prostheses. 3. Cirrhotic appearing liver. 4. Cholelithiasis without evidence of acute cholecystitis. 5. Aortic atherosclerosis. 6. Additional chronic/incidental findings detailed above. Electronically Signed   By: Franki Cabot M.D.   On: 04/02/2017 20:02   Dg Chest 1 View  Result Date: 03/31/2017 CLINICAL DATA:  Fall EXAM: CHEST 1 VIEW COMPARISON:  03/14/2017 FINDINGS: Postsurgical changes in the cervical spine. Mild atelectasis at the left base. No focal consolidation. No pneumothorax. Mild cardiomegaly with aortic atherosclerosis. Treated vertebral compression fracture in the midthoracic spine. IMPRESSION: 1. Minimal left basilar atelectasis. 2. Stable mild cardiomegaly. Electronically Signed   By: Donavan Foil M.D.   On: 03/31/2017 18:18   Dg Pelvis 1-2 Views  Result Date: 03/31/2017 CLINICAL DATA:  Fall EXAM: PELVIS - 1-2 VIEW COMPARISON:  CT abdomen pelvis 09/11/2016 FINDINGS: Status post bilateral hip replacements with normal alignment. No acute displaced fracture is seen. The SI joints do not appear widened. Pubic symphysis and rami are intact. IMPRESSION: Postsurgical changes of both hips. No definite acute osseous abnormality Electronically Signed   By: Donavan Foil M.D.   On: 03/31/2017 18:16   Ct Head Wo Contrast  Result Date: 03/31/2017 CLINICAL DATA:  Altered level of consciousness, unexplained. Confusion EXAM: CT HEAD WITHOUT CONTRAST TECHNIQUE: Contiguous axial images were  obtained from the base of the skull through the vertex without intravenous contrast. COMPARISON:  03/13/2017 FINDINGS: Brain: No evidence of acute infarction, hemorrhage, hydrocephalus, extra-axial collection or mass lesion/mass effect. Mild chronic small vessel ischemia in the periventricular white matter. Generalized volume loss, age congruent. Vascular: Atherosclerotic calcification.  No hyperdense vessel. Skull: Negative Sinuses/Orbits: Chronic left maxillary sinusitis with complete opacification. This has been seen since at least 2012. bilateral cataract resection. IMPRESSION: 1. Senescent  changes without acute finding. 2. Chronic left maxillary sinusitis. Electronically Signed   By: Monte Fantasia M.D.   On: 03/31/2017 16:38   Ct Hip Right Wo Contrast  Result Date: 04/02/2017 CLINICAL DATA:  Anemia, unexplained. EXAM: CT OF THE RIGHT HIP WITHOUT CONTRAST TECHNIQUE: Multidetector CT imaging of the right hip was performed according to the standard protocol. Multiplanar CT image reconstructions were also generated. COMPARISON:  None. FINDINGS: Bones/Joint/Cartilage Right hip prosthesis hardware appears intact and appropriately positioned. No evidence of surgical complicating feature. Plate and screw fixation hardware along the right femur shaft, traversing the recently fixated fracture site, also appears intact and appropriately positioned. No new osseous abnormality. Visualized portions of the right hemipelvis appear intact and appropriately aligned. Ligaments Suboptimally assessed by CT. Soft tissues Expected ill-defined edema within the subcutaneous soft tissues of the upper right thigh. No circumscribed soft tissue hematoma appreciated. IMPRESSION: 1. Right hip prosthesis hardware appears intact and appropriately positioned. Plate and screw fixation hardware along the right femur shaft also appears intact and appropriately positioned. No evidence of surgical complicating feature. 2. Expected ill-defined  edema within the subcutaneous soft tissues of the upper right thigh and about the right hip. No circumscribed soft tissue hematoma appreciated. Electronically Signed   By: Franki Cabot M.D.   On: 04/02/2017 20:08   Dg Femur Min 2 Views Left  Result Date: 03/31/2017 CLINICAL DATA:  Bilateral leg pain EXAM: LEFT FEMUR 2 VIEWS COMPARISON:  03/14/2017 FINDINGS: No acute displaced fracture is seen. Patient is status post left hip replacement with normal alignment. Status post left knee replacement with normal alignment. Moderate to large knee effusion with meniscus level. Vascular calcifications. IMPRESSION: 1. Status post left hip and knee replacement with normal alignment 2. Moderate to large knee effusion with meniscus level but no definitive fracture seen. Electronically Signed   By: Donavan Foil M.D.   On: 03/31/2017 18:14   Dg Femur Min 2 Views Right  Result Date: 03/31/2017 CLINICAL DATA:  Status post fall today.  Initial encounter. EXAM: RIGHT FEMUR 2 VIEWS COMPARISON:  Plain films of the right femur 03/15/2017, 03/13/2017 and 03/17/2017. FINDINGS: Plate and screw fixation of a comminuted diaphyseal fracture appears unchanged since the most recent exams. The patient is status post right hip and knee replacement. No acute abnormality is identified. IMPRESSION: No acute finding. Status post fixation of a comminuted right femur fracture. Status post right hip and knee replacement. Electronically Signed   By: Inge Rise M.D.   On: 03/31/2017 18:07     Subjective: Ongoing pain around the right knee.  Reports having a BM yesterday.  No nausea or vomiting.  No abdominal pain.  Tolerating diet.  Denies any other complaints.  Discharge Exam:  Vitals:   04/05/17 0500 04/05/17 1500 04/05/17 2227 04/06/17 0700  BP:  130/79 140/75 137/67  Pulse:  83 90 88  Resp:  15    Temp:  98.9 F (37.2 C) 98.8 F (37.1 C) 97.8 F (36.6 C)  TempSrc:  Oral Oral Axillary  SpO2:  98% 97% 98%  Weight: 74.7  kg (164 lb 10.9 oz)     Height:        General: Pleasant elderly female lying comfortably propped up in bed. Lungs: Clear to auscultation bilaterally - no wheeze or crackles.  No increased work of breathing. Cardiovascular: regular rate - no M or rub.  No JVD.  Trace bilateral ankle edema.   Abdomen: NT/ND, soft, bs+, no mass.  Normal bowel sounds  heard. Extremities: trace edema B LE.  Healed surgical scars over both knees.  Healing surgical scar over the lateral aspect of right knee without acute findings. CNS: Alert and oriented.  No focal neurological deficits.      The results of significant diagnostics from this hospitalization (including imaging, microbiology, ancillary and laboratory) are listed below for reference.     Microbiology: Recent Results (from the past 240 hour(s))  MRSA PCR Screening     Status: None   Collection Time: 04/01/17  6:06 AM  Result Value Ref Range Status   MRSA by PCR NEGATIVE NEGATIVE Final    Comment:        The GeneXpert MRSA Assay (FDA approved for NASAL specimens only), is one component of a comprehensive MRSA colonization surveillance program. It is not intended to diagnose MRSA infection nor to guide or monitor treatment for MRSA infections.      Labs: CBC: Recent Labs  Lab 04/01/17 1743 04/01/17 1940 04/02/17 0516 04/02/17 1107 04/02/17 1619 04/03/17 0145 04/04/17 0238 04/05/17 0605 04/06/17 0717  WBC 13.1* 11.7* 12.5* 10.5 8.4 6.5 7.1 7.8 7.5  NEUTROABS 8.9* 8.4* 8.3* 7.8* 7.2  --   --   --   --   HGB 7.9* 7.7* 7.9* 7.2* 6.5* 7.8* 8.1* 8.7* 8.6*  HCT 25.2* 24.3* 25.7* 22.7* 20.9* 24.0* 25.0* 27.2* 27.2*  MCV 102.9* 102.5* 103.2* 102.7* 103.5* 98.4 100.0 102.3* 101.9*  PLT 150 147* 138* 131* 100* 100* 122* 131* 938*   Basic Metabolic Panel: Recent Labs  Lab 04/01/17 0608 04/02/17 0516 04/03/17 0145 04/04/17 0238 04/05/17 0605  NA 134* 135 136 138 141  K 3.9 3.9 4.3 4.3 4.4  CL 99* 103 105 106 108  CO2 21* 20*  21* 25 24  GLUCOSE 187* 110* 237* 267* 140*  BUN 25* 23* 17 23* 25*  CREATININE 1.10* 0.85 0.79 0.93 0.87  CALCIUM 8.8* 8.3* 8.2* 8.4* 8.9   Liver Function Tests: Recent Labs  Lab 03/31/17 1604 04/01/17 0608 04/04/17 0238  AST 35 23 18  ALT 18 15 13*  ALKPHOS 122 110 86  BILITOT 1.6* 1.5* 1.1  PROT 5.9* 5.7* 4.9*  ALBUMIN 3.1* 2.9* 2.4*   BNP (last 3 results) Recent Labs    10/30/16 2002 02/10/17 1052  BNP 395.0* 281.3*   Cardiac Enzymes: Recent Labs  Lab 04/01/17 0104 04/01/17 0608 04/01/17 1114  TROPONINI <0.03 <0.03 0.03*   CBG: Recent Labs  Lab 04/05/17 1235 04/05/17 1621 04/05/17 2231 04/06/17 0628 04/06/17 1155  GLUCAP 230* 194* 289* 172* 90   Urinalysis    Component Value Date/Time   COLORURINE AMBER (A) 04/01/2017 1318   APPEARANCEUR HAZY (A) 04/01/2017 1318   APPEARANCEUR Clear 05/07/2013 1436   LABSPEC 1.020 04/01/2017 1318   PHURINE 5.0 04/01/2017 1318   GLUCOSEU NEGATIVE 04/01/2017 1318   HGBUR NEGATIVE 04/01/2017 1318   BILIRUBINUR NEGATIVE 04/01/2017 1318   BILIRUBINUR Neg 12/11/2013 1345   BILIRUBINUR Negative 05/07/2013 1436   KETONESUR 5 (A) 04/01/2017 1318   PROTEINUR NEGATIVE 04/01/2017 1318   UROBILINOGEN 0.2 12/11/2013 1345   UROBILINOGEN 1.0 11/16/2013 1256   NITRITE POSITIVE (A) 04/01/2017 1318   LEUKOCYTESUR MODERATE (A) 04/01/2017 1318   LEUKOCYTESUR Negative 05/07/2013 1436      Time coordinating discharge: Over 30 minutes  SIGNED:  Vernell Leep, MD, FACP, [facility]. [facility] Pager [phone]  If 7PM-7AM, please contact night-coverage [email or web address] Password [facility] 04/06/2017, 12:45 PM

## 2017-04-06 NOTE — Progress Notes (Signed)
RN called report to Ivin Booty at [facility].

## 2017-04-06 NOTE — Social Work (Signed)
Clinical Social Worker facilitated patient discharge including contacting patient family and facility to confirm patient discharge plans.  Clinical information faxed to facility and family agreeable with plan.    CSW arranged ambulance transport via [facility] to [place].    RN to call [phone] to give report prior to discharge.  Clinical Social Worker will sign off for now as social work intervention is no longer needed. Please consult [place] again if new need arises.  Elissa Hefty, LCSW Clinical Social Worker [phone]

## 2017-05-19 ENCOUNTER — Encounter (HOSPITAL_COMMUNITY): Payer: Self-pay | Admitting: *Deleted

## 2017-05-19 ENCOUNTER — Emergency Department (HOSPITAL_COMMUNITY): Payer: Medicare Other

## 2017-05-19 ENCOUNTER — Inpatient Hospital Stay (HOSPITAL_COMMUNITY)
Admission: EM | Admit: 2017-05-19 | Discharge: 2017-05-26 | DRG: 871 | Disposition: A | Discharge status: Skilled Nursing Facility | Payer: Medicare Other | Attending: Internal Medicine | Admitting: Internal Medicine

## 2017-05-19 DIAGNOSIS — Z961 Presence of intraocular lens: Secondary | ICD-10-CM | POA: Diagnosis present

## 2017-05-19 DIAGNOSIS — I4891 Unspecified atrial fibrillation: Secondary | ICD-10-CM | POA: Diagnosis present

## 2017-05-19 DIAGNOSIS — M353 Polymyalgia rheumatica: Secondary | ICD-10-CM | POA: Diagnosis present

## 2017-05-19 DIAGNOSIS — G9341 Metabolic encephalopathy: Secondary | ICD-10-CM | POA: Diagnosis present

## 2017-05-19 DIAGNOSIS — Z9842 Cataract extraction status, left eye: Secondary | ICD-10-CM | POA: Diagnosis not present

## 2017-05-19 DIAGNOSIS — Z66 Do not resuscitate: Secondary | ICD-10-CM | POA: Diagnosis present

## 2017-05-19 DIAGNOSIS — M315 Giant cell arteritis with polymyalgia rheumatica: Secondary | ICD-10-CM | POA: Diagnosis present

## 2017-05-19 DIAGNOSIS — M810 Age-related osteoporosis without current pathological fracture: Secondary | ICD-10-CM | POA: Diagnosis present

## 2017-05-19 DIAGNOSIS — D696 Thrombocytopenia, unspecified: Secondary | ICD-10-CM | POA: Diagnosis not present

## 2017-05-19 DIAGNOSIS — Z809 Family history of malignant neoplasm, unspecified: Secondary | ICD-10-CM

## 2017-05-19 DIAGNOSIS — D649 Anemia, unspecified: Secondary | ICD-10-CM | POA: Diagnosis not present

## 2017-05-19 DIAGNOSIS — E785 Hyperlipidemia, unspecified: Secondary | ICD-10-CM | POA: Diagnosis present

## 2017-05-19 DIAGNOSIS — E1042 Type 1 diabetes mellitus with diabetic polyneuropathy: Secondary | ICD-10-CM | POA: Diagnosis present

## 2017-05-19 DIAGNOSIS — Z7901 Long term (current) use of anticoagulants: Secondary | ICD-10-CM

## 2017-05-19 DIAGNOSIS — Z96643 Presence of artificial hip joint, bilateral: Secondary | ICD-10-CM | POA: Diagnosis present

## 2017-05-19 DIAGNOSIS — D61818 Other pancytopenia: Secondary | ICD-10-CM | POA: Diagnosis present

## 2017-05-19 DIAGNOSIS — I5032 Chronic diastolic (congestive) heart failure: Secondary | ICD-10-CM | POA: Diagnosis present

## 2017-05-19 DIAGNOSIS — Z881 Allergy status to other antibiotic agents status: Secondary | ICD-10-CM

## 2017-05-19 DIAGNOSIS — I959 Hypotension, unspecified: Secondary | ICD-10-CM | POA: Diagnosis present

## 2017-05-19 DIAGNOSIS — Z86718 Personal history of other venous thrombosis and embolism: Secondary | ICD-10-CM

## 2017-05-19 DIAGNOSIS — Z96653 Presence of artificial knee joint, bilateral: Secondary | ICD-10-CM | POA: Diagnosis present

## 2017-05-19 DIAGNOSIS — Z981 Arthrodesis status: Secondary | ICD-10-CM | POA: Diagnosis not present

## 2017-05-19 DIAGNOSIS — A419 Sepsis, unspecified organism: Secondary | ICD-10-CM | POA: Diagnosis not present

## 2017-05-19 DIAGNOSIS — Z8673 Personal history of transient ischemic attack (TIA), and cerebral infarction without residual deficits: Secondary | ICD-10-CM

## 2017-05-19 DIAGNOSIS — E1022 Type 1 diabetes mellitus with diabetic chronic kidney disease: Secondary | ICD-10-CM | POA: Diagnosis present

## 2017-05-19 DIAGNOSIS — I130 Hypertensive heart and chronic kidney disease with heart failure and stage 1 through stage 4 chronic kidney disease, or unspecified chronic kidney disease: Secondary | ICD-10-CM | POA: Diagnosis present

## 2017-05-19 DIAGNOSIS — Z888 Allergy status to other drugs, medicaments and biological substances status: Secondary | ICD-10-CM

## 2017-05-19 DIAGNOSIS — Z8553 Personal history of malignant neoplasm of renal pelvis: Secondary | ICD-10-CM | POA: Diagnosis not present

## 2017-05-19 DIAGNOSIS — I10 Essential (primary) hypertension: Secondary | ICD-10-CM | POA: Diagnosis present

## 2017-05-19 DIAGNOSIS — Z794 Long term (current) use of insulin: Secondary | ICD-10-CM

## 2017-05-19 DIAGNOSIS — R509 Fever, unspecified: Secondary | ICD-10-CM

## 2017-05-19 DIAGNOSIS — R401 Stupor: Secondary | ICD-10-CM

## 2017-05-19 DIAGNOSIS — Z9841 Cataract extraction status, right eye: Secondary | ICD-10-CM | POA: Diagnosis not present

## 2017-05-19 DIAGNOSIS — I480 Paroxysmal atrial fibrillation: Secondary | ICD-10-CM | POA: Diagnosis not present

## 2017-05-19 DIAGNOSIS — D539 Nutritional anemia, unspecified: Secondary | ICD-10-CM | POA: Diagnosis not present

## 2017-05-19 DIAGNOSIS — E1129 Type 2 diabetes mellitus with other diabetic kidney complication: Secondary | ICD-10-CM | POA: Diagnosis not present

## 2017-05-19 DIAGNOSIS — N183 Chronic kidney disease, stage 3 (moderate): Secondary | ICD-10-CM | POA: Diagnosis present

## 2017-05-19 DIAGNOSIS — H3530 Unspecified macular degeneration: Secondary | ICD-10-CM | POA: Diagnosis present

## 2017-05-19 DIAGNOSIS — M159 Polyosteoarthritis, unspecified: Secondary | ICD-10-CM | POA: Diagnosis present

## 2017-05-19 DIAGNOSIS — Z87891 Personal history of nicotine dependence: Secondary | ICD-10-CM

## 2017-05-19 DIAGNOSIS — R4182 Altered mental status, unspecified: Secondary | ICD-10-CM | POA: Diagnosis not present

## 2017-05-19 DIAGNOSIS — M479 Spondylosis, unspecified: Secondary | ICD-10-CM | POA: Diagnosis present

## 2017-05-19 DIAGNOSIS — Z885 Allergy status to narcotic agent status: Secondary | ICD-10-CM

## 2017-05-19 DIAGNOSIS — E860 Dehydration: Secondary | ICD-10-CM | POA: Diagnosis present

## 2017-05-19 DIAGNOSIS — T380X5A Adverse effect of glucocorticoids and synthetic analogues, initial encounter: Secondary | ICD-10-CM | POA: Diagnosis present

## 2017-05-19 LAB — COMPREHENSIVE METABOLIC PANEL
ALBUMIN: 2.4 g/dL — AB (ref 3.5–5.0)
ALT: 26 U/L (ref 14–54)
ANION GAP: 10 (ref 5–15)
AST: 32 U/L (ref 15–41)
Alkaline Phosphatase: 85 U/L (ref 38–126)
BILIRUBIN TOTAL: 1 mg/dL (ref 0.3–1.2)
BUN: 13 mg/dL (ref 6–20)
CO2: 24 mmol/L (ref 22–32)
Calcium: 8.6 mg/dL — ABNORMAL LOW (ref 8.9–10.3)
Chloride: 101 mmol/L (ref 101–111)
Creatinine, Ser: 0.9 mg/dL (ref 0.44–1.00)
GFR calc Af Amer: >60 mL/min (ref >60)
GFR calc non Af Amer: 55 mL/min — ABNORMAL LOW (ref >60)
GLUCOSE: 101 mg/dL — AB (ref 65–99)
Potassium: 3.9 mmol/L (ref 3.5–5.1)
Sodium: 135 mmol/L (ref 135–145)
TOTAL PROTEIN: 5.6 g/dL — AB (ref 6.5–8.1)

## 2017-05-19 LAB — URINALYSIS, COMPLETE (UACMP) WITH MICROSCOPIC
BILIRUBIN URINE: NEGATIVE
Bacteria, UA: NONE SEEN
Glucose, UA: NEGATIVE mg/dL
Ketones, ur: 5 mg/dL — AB
Nitrite: NEGATIVE
PH: 5 (ref 5.0–8.0)
Protein, ur: NEGATIVE mg/dL
SPECIFIC GRAVITY, URINE: 1.025 (ref 1.005–1.030)

## 2017-05-19 LAB — PROCALCITONIN: PROCALCITONIN: 0.17 ng/mL

## 2017-05-19 LAB — CBC WITH DIFFERENTIAL/PLATELET
BASOS PCT: 0 %
Basophils Absolute: 0 10*3/uL (ref 0.0–0.1)
EOS ABS: 0.1 10*3/uL (ref 0.0–0.7)
Eosinophils Relative: 1 %
HCT: 36.2 % (ref 36.0–46.0)
Hemoglobin: 11.4 g/dL — ABNORMAL LOW (ref 12.0–15.0)
Lymphocytes Relative: 26 %
Lymphs Abs: 2.5 10*3/uL (ref 0.7–4.0)
MCH: 32.1 pg (ref 26.0–34.0)
MCHC: 31.5 g/dL (ref 30.0–36.0)
MCV: 102 fL — ABNORMAL HIGH (ref 78.0–100.0)
MONO ABS: 1.2 10*3/uL — AB (ref 0.1–1.0)
Monocytes Relative: 13 %
NEUTROS ABS: 5.7 10*3/uL (ref 1.7–7.7)
Neutrophils Relative %: 60 %
PLATELETS: 125 10*3/uL — AB (ref 150–400)
RBC: 3.55 MIL/uL — ABNORMAL LOW (ref 3.87–5.11)
RDW: 15.6 % — ABNORMAL HIGH (ref 11.5–15.5)
WBC: 9.5 10*3/uL (ref 4.0–10.5)

## 2017-05-19 LAB — PROTIME-INR
INR: 1.92
PROTHROMBIN TIME: 21.8 s — AB (ref 11.4–15.2)

## 2017-05-19 LAB — INFLUENZA PANEL BY PCR (TYPE A & B)
INFLAPCR: NEGATIVE
INFLBPCR: NEGATIVE

## 2017-05-19 LAB — SEDIMENTATION RATE: Sed Rate: 87 mm/hr — ABNORMAL HIGH (ref 0–22)

## 2017-05-19 LAB — CBG MONITORING, ED: Glucose-Capillary: 102 mg/dL — ABNORMAL HIGH (ref 65–99)

## 2017-05-19 LAB — BRAIN NATRIURETIC PEPTIDE: B Natriuretic Peptide: 284.9 pg/mL — ABNORMAL HIGH (ref 0.0–100.0)

## 2017-05-19 LAB — I-STAT CG4 LACTIC ACID, ED: Lactic Acid, Venous: 1.34 mmol/L (ref 0.5–1.9)

## 2017-05-19 MED ORDER — ACETAMINOPHEN 500 MG PO TABS
1000.0000 mg | ORAL_TABLET | Freq: Once | ORAL | Status: AC
  Administered 2017-05-19: 1000 mg via ORAL
  Filled 2017-05-19: qty 2

## 2017-05-19 MED ORDER — MIRTAZAPINE 15 MG PO TABS
15.0000 mg | ORAL_TABLET | Freq: Every day | ORAL | Status: DC
  Administered 2017-05-20 – 2017-05-25 (×6): 15 mg via ORAL
  Filled 2017-05-19 (×6): qty 1

## 2017-05-19 MED ORDER — PIPERACILLIN-TAZOBACTAM 3.375 G IVPB
3.3750 g | Freq: Three times a day (TID) | INTRAVENOUS | Status: DC
  Filled 2017-05-19 (×3): qty 50

## 2017-05-19 MED ORDER — OXYBUTYNIN CHLORIDE 5 MG PO TABS
5.0000 mg | ORAL_TABLET | Freq: Three times a day (TID) | ORAL | Status: DC | PRN
  Filled 2017-05-19: qty 1

## 2017-05-19 MED ORDER — SODIUM CHLORIDE 0.9% FLUSH
10.0000 mL | Freq: Two times a day (BID) | INTRAVENOUS | Status: DC
  Administered 2017-05-20 – 2017-05-26 (×3): 10 mL

## 2017-05-19 MED ORDER — PIPERACILLIN-TAZOBACTAM 3.375 G IVPB 30 MIN
3.3750 g | Freq: Once | INTRAVENOUS | Status: AC
  Administered 2017-05-20: 3.375 g via INTRAVENOUS

## 2017-05-19 MED ORDER — SODIUM CHLORIDE 0.9% FLUSH: 10.0000 mL | INTRAVENOUS | Status: DC | PRN

## 2017-05-19 MED ORDER — VANCOMYCIN HCL IN DEXTROSE 1-5 GM/200ML-% IV SOLN
1000.0000 mg | INTRAVENOUS | Status: DC
  Administered 2017-05-20 – 2017-05-22 (×3): 1000 mg via INTRAVENOUS
  Filled 2017-05-19 (×3): qty 200

## 2017-05-19 MED ORDER — SODIUM CHLORIDE 0.9 % IV SOLN
INTRAVENOUS | Status: DC
  Administered 2017-05-19: 125 mL/h via INTRAVENOUS

## 2017-05-19 MED ORDER — COLLAGENASE 250 UNIT/GM EX OINT
1.0000 | TOPICAL_OINTMENT | Freq: Every day | CUTANEOUS | Status: DC
Start: 2017-05-19 — End: 2017-05-26
  Administered 2017-05-20 – 2017-05-26 (×8): 1 via TOPICAL
  Filled 2017-05-19: qty 30

## 2017-05-19 MED ORDER — PIPERACILLIN SOD-TAZOBACTAM SO 3.375 (3-0.375) G IV SOLR: 3.3750 g | Freq: Three times a day (TID) | INTRAVENOUS | Status: DC

## 2017-05-19 MED ORDER — ACETAMINOPHEN 650 MG RE SUPP: 650.0000 mg | Freq: Four times a day (QID) | RECTAL | Status: DC | PRN

## 2017-05-19 MED ORDER — METOPROLOL TARTRATE 25 MG PO TABS: 12.5000 mg | ORAL_TABLET | Freq: Two times a day (BID) | ORAL | Status: DC

## 2017-05-19 MED ORDER — SODIUM CHLORIDE 0.9 % IV SOLN
INTRAVENOUS | Status: DC
  Administered 2017-05-20 – 2017-05-23 (×6): via INTRAVENOUS

## 2017-05-19 MED ORDER — VANCOMYCIN HCL IN DEXTROSE 1-5 GM/200ML-% IV SOLN
1000.0000 mg | Freq: Once | INTRAVENOUS | Status: AC
  Administered 2017-05-19: 1000 mg via INTRAVENOUS
  Filled 2017-05-19: qty 200

## 2017-05-19 MED ORDER — METOPROLOL TARTRATE 12.5 MG HALF TABLET
12.5000 mg | ORAL_TABLET | Freq: Two times a day (BID) | ORAL | Status: DC
  Administered 2017-05-20 – 2017-05-26 (×13): 12.5 mg via ORAL
  Filled 2017-05-19 (×13): qty 1

## 2017-05-19 MED ORDER — GABAPENTIN 300 MG PO CAPS: 300.0000 mg | ORAL_CAPSULE | ORAL | Status: DC

## 2017-05-19 MED ORDER — FAMOTIDINE 20 MG PO TABS
20.0000 mg | ORAL_TABLET | Freq: Every day | ORAL | Status: DC
  Administered 2017-05-20 – 2017-05-26 (×7): 20 mg via ORAL
  Filled 2017-05-19 (×7): qty 1

## 2017-05-19 MED ORDER — INSULIN ASPART 100 UNIT/ML ~~LOC~~ SOLN
0.0000 [IU] | Freq: Three times a day (TID) | SUBCUTANEOUS | Status: DC
  Administered 2017-05-20 – 2017-05-21 (×3): 4 [IU] via SUBCUTANEOUS

## 2017-05-19 MED ORDER — DEXAMETHASONE SODIUM PHOSPHATE 10 MG/ML IJ SOLN
10.0000 mg | Freq: Once | INTRAMUSCULAR | Status: AC
  Administered 2017-05-19: 10 mg via INTRAVENOUS
  Filled 2017-05-19: qty 1

## 2017-05-19 MED ORDER — GABAPENTIN 300 MG PO CAPS
600.0000 mg | ORAL_CAPSULE | Freq: Every day | ORAL | Status: DC
  Administered 2017-05-20 – 2017-05-25 (×6): 600 mg via ORAL
  Filled 2017-05-19 (×6): qty 2

## 2017-05-19 MED ORDER — ACETAMINOPHEN 325 MG PO TABS
650.0000 mg | ORAL_TABLET | Freq: Four times a day (QID) | ORAL | Status: DC | PRN
  Filled 2017-05-19: qty 2

## 2017-05-19 MED ORDER — SODIUM CHLORIDE 0.9 % IV BOLUS (SEPSIS)
1000.0000 mL | Freq: Once | INTRAVENOUS | Status: AC
  Administered 2017-05-19: 1000 mL via INTRAVENOUS

## 2017-05-19 MED ORDER — GABAPENTIN 300 MG PO CAPS
300.0000 mg | ORAL_CAPSULE | Freq: Every day | ORAL | Status: DC
  Administered 2017-05-20 – 2017-05-26 (×7): 300 mg via ORAL
  Filled 2017-05-19 (×7): qty 1

## 2017-05-19 MED ORDER — ONDANSETRON HCL 4 MG PO TABS: 4.0000 mg | ORAL_TABLET | Freq: Four times a day (QID) | ORAL | Status: DC | PRN

## 2017-05-19 MED ORDER — ONDANSETRON HCL 4 MG/2ML IJ SOLN: 4.0000 mg | Freq: Four times a day (QID) | INTRAMUSCULAR | Status: DC | PRN

## 2017-05-19 MED ORDER — SENNA 8.6 MG PO TABS
1.0000 | ORAL_TABLET | Freq: Every day | ORAL | Status: DC | PRN
  Administered 2017-05-25: 8.6 mg via ORAL
  Filled 2017-05-19 (×2): qty 1

## 2017-05-19 MED ORDER — IPRATROPIUM-ALBUTEROL 0.5-2.5 (3) MG/3ML IN SOLN: 3.0000 mL | RESPIRATORY_TRACT | Status: DC | PRN

## 2017-05-19 MED ORDER — APIXABAN 5 MG PO TABS
5.0000 mg | ORAL_TABLET | Freq: Two times a day (BID) | ORAL | Status: DC
  Administered 2017-05-20 – 2017-05-26 (×13): 5 mg via ORAL
  Filled 2017-05-19 (×13): qty 1

## 2017-05-19 MED ORDER — FERROUS SULFATE 325 (65 FE) MG PO TABS
325.0000 mg | ORAL_TABLET | Freq: Two times a day (BID) | ORAL | Status: DC
  Administered 2017-05-20 – 2017-05-26 (×13): 325 mg via ORAL
  Filled 2017-05-19 (×13): qty 1

## 2017-05-19 MED ORDER — GLUCERNA SHAKE PO LIQD
237.0000 mL | Freq: Two times a day (BID) | ORAL | Status: DC
  Administered 2017-05-20 – 2017-05-26 (×14): 237 mL via ORAL
  Filled 2017-05-19 (×16): qty 237

## 2017-05-19 NOTE — ED Provider Notes (Addendum)
[facility] EMERGENCY DEPARTMENT Provider Note   CSN: [id number] Arrival date & time: 05/19/17  1133     History   Chief Complaint Chief Complaint  Patient presents with  . Altered Mental Status    HPI [name] is a 84 y.o. female.  HPI Patient presents from a nursing facility with concern of altered mental status per Reportedly the patient is typically awake and alert, conversant. However, reportedly over the past 12 hours or so the patient has become less interactive, confused, less verbal, with increased cough. Patient is reportedly receiving ongoing therapy for pneumonia, had a dose of Zosyn earlier today. When the patient has asked why she is here, she replies " to play golf". Level 5 caveat secondary to altered mental status.  Nursing home chart reviewed, multiple medications, and DNR status. Past Medical History:  Diagnosis Date  . Abnormality of gait   . Anemia, unspecified   . Anxiety state, unspecified   . Arthritis    BACK , NECK, AND JOINTS  . 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 CHF (congestive heart failure) ([place])   . Chronic cystitis   . Chronic steroid use   . COPD (chronic obstructive pulmonary disease) (H[facility])    no inhalers used   . Depressive disorder, not elsewhere classified   . Dermatophytosis of nail   . Diabetic peripheral neuropathy ([place])   . Diaphragmatic hernia without mention of obstruction or gangrene   . Hemoperitoneum (nontraumatic)   . Herpes zoster with other nervous system complications(053.19)   . History of TIA (transient ischemic attack) 2012--  NO RESIDUAL  . HTN (hypertension)   . 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 Insulin dependent diabetes mellitus ([place])   . Macular degeneration of both eyes   . 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 Osteoporosis   . Osteoporosis, unspecified   . Other and unspecified hyperlipidemia   . Personality change due to conditions classified elsewhere    . PONV (postoperative nausea and vomiting)    in past, none recently  . 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 Temporal arteritis ([place])   . Type I (juvenile type) diabetes mellitus without mention of complication, uncontrolled   . Unspecified essential hypertension   . Unspecified late effects of cerebrovascular disease   . Urinary frequency     Patient Active Problem List   Diagnosis Date Noted  . Malnutrition of moderate degree 04/03/2017  . Pressure injury of skin 04/02/2017  . Shock ([place]) 04/01/2017  . Left leg pain 03/31/2017  . Periprosthetic fracture around internal prosthetic right hip joint ([place]) 03/17/2017  . Atrial fibrillation ([place]) 03/13/2017  . Closed fracture of condyle of right femur ([place]) 03/13/2017  . Type 2 diabetes mellitus with renal manifestations ([place]) 03/13/2017  . Femur fracture ([place]) 03/13/2017  . Atrophic vaginitis 12/11/2013  . UTI (lower urinary tract infection) 11/16/2013  . Acute encephalopathy 11/16/2013  . FTT (failure to thrive) in adult 11/16/2013  . Subdural hematoma ([place]) 11/06/2013  . Fall 11/06/2013  . Hematoma of leg 09/04/2013  . Generalized osteoarthritis of hand 09/04/2013  . Cerumen impaction 09/04/2013  . Rales 01/15/2013  . Chronic kidney disease, stage III (moderate) ([place]) 12/19/2012  . Sinus congestion 10/24/2012  . Fracture of femoral neck, right, closed ([place]) 08/02/2012  . Hemoperitoneum (nontraumatic) 06/19/2012  . 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 Dysphagia, unspecified(787.20) 06/19/2012  . Urinary frequency  06/19/2012  . Unspecified late effects of cerebrovascular disease 06/19/2012  . Personal history of fall 06/19/2012  . Abnormality of gait 06/19/2012  . Synovial cyst of popliteal space 06/19/2012  . Muscle weakness (generalized) 06/19/2012  . Osteoporosis, unspecified 06/19/2012  . Anxiety state, unspecified 06/19/2012  . Osteoarthrosis, unspecified  whether generalized or localized, unspecified site 06/19/2012  . Leg pain 06/19/2012  . Knee joint replacement by other means 06/19/2012  . Pain in joint, lower leg 06/19/2012  . Hyperlipidemia 06/19/2012  . Cervicalgia 06/19/2012  . Lack of coordination 06/19/2012  . Pain in joint, shoulder region 06/19/2012  . Dermatophytosis of nail 06/19/2012  . Unspecified urinary incontinence 06/19/2012  . Herpes zoster without mention of complication 29/93/7169  . Unspecified hereditary and idiopathic peripheral neuropathy 06/19/2012  . Osteoarthrosis, unspecified whether generalized or localized, lower leg 06/19/2012  . Lumbago 06/19/2012  . Enthesopathy of hip region 06/19/2012  . Internal hemorrhoids without mention of complication 67/89/3810  . Diaphragmatic hernia without mention of obstruction or gangrene 06/19/2012  . Renal abscess, right 06/11/2012  . Recurrent UTI 06/07/2012  . SIRS (systemic inflammatory response syndrome) ([place]) 06/07/2012  . Retroperitoneal bleed 03/20/2012  . Chronic heart failure ([place]) 03/20/2012  . 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 Degenerative arthritis of cervical spine 02/09/2011  . Degenerative arthritis of hip 02/09/2011  . Degenerative arthritis of knee 02/09/2011  . Carpal tunnel syndrome of right wrist 02/09/2011  . TIA (transient ischemic attack) 02/09/2011  . Anemia 01/31/2011  . Hypoxia 01/31/2011  . Renal oncocytoma 01/12/2011  . Type II or unspecified type diabetes mellitus with renal manifestations, uncontrolled(250.42) 01/11/2011  . Essential hypertension 01/11/2011  . Polymyalgia rheumatica ([place]) 01/11/2011  . Depression 01/11/2011    Past Surgical History:  Procedure Laterality Date  . ANTERIOR CERVICAL DECOMP/DISCECTOMY FUSION  10-12-2005   C5 -- C7  . CARPAL TUNNEL RELEASE Right 2009  (APPROX)  . CATARACT EXTRACTION W/ INTRAOCULAR LENS  IMPLANT, BILATERAL Right Oct 1992   Epes  . CATARACT EXTRACTION W/ INTRAOCULAR LENS  IMPLANT Left 1993   Epes  . CRYOABLATION RIGHT RENAL MASS  03-16-2012   ONCOCYTOMA (POST OP 06-09-2012 PERCUTANEOUS DRAIN PLACEMENT FOR PERINEPHRIC ABSCESS)  . CYSTOSCOPY W/ URETERAL STENT PLACEMENT Right 07/10/2012   Procedure: CYSTOSCOPY WITH RIGHT RETROGRADE PYELOGRAM/URETERAL STENT PLACEMENT;  Surgeon: Malka So, MD;  Location[facility];  Service: Urology;  Laterality: Right;  . ELBOW SURGERY Bilateral 2003  (APPROX)  . HIP ARTHROPLASTY Right 08/03/2012   Procedure: CEMENTED MONOPOLAR HIP;  Surgeon: Marybelle Killings, MD;  Location: [facility];  Service: Orthopedics;  Laterality: Right;  . LUMBAR LAMINECTOMY  11-19-2003   L3 -- L4  . ORIF FEMUR FRACTURE Right 03/15/2017   Procedure: OPEN REDUCTION INTERNAL FIXATION (ORIF) DISTAL FEMUR FRACTURE;  Surgeon: Shona Needles, MD;  Location: [facility];  Service: Orthopedics;  Laterality: Right;  . ORIF W/ PLATE RIGHT DISTAL RADIUS FX  06-18-2003  . POSTERIOR FUSION CERVICAL SPINE  02-10-2006   C5 -- C7  . TEMPORAL ARTERY BIOPSY / LIGATION    . TOTAL HIP ARTHROPLASTY Left 01-22-2005  . TOTAL KNEE ARTHROPLASTY Bilateral LEFT 08-08-2007;  RIGHT 05-08-2009    OB History    No data available       Home Medications    Prior to Admission medications   Medication Sig Start Date End Date Taking? Authorizing Provider  apixaban (ELIQUIS) 5 MG TABS tablet Take 1 tablet (5 mg total) by mouth 2 (two) times  daily. 04/06/17  Yes Hongalgi, Lenis Dickinson, MD  collagenase (SANTYL) ointment Apply 1 application topically daily. Apply a dime-sized thickness amount to entire wound bed daily   Yes [provider]  ferrous sulfate 325 (65 FE) MG tablet Take 1 tablet (325 mg total) by mouth 2 (two) times daily with a meal. 03/18/17  Yes Patrecia Pour, Christean Grief, MD  gabapentin (NEURONTIN) 300 MG capsule Take 1 capsule (300 mg total) by mouth 2 (two) times daily. Taking 1 tab in the am , and 2 tabs in the pm. Patient taking differently: Take 300-600 mg by mouth  See admin instructions. 300mg  in am, 600mg  in pm 04/06/17  Yes Hongalgi, Lenis Dickinson, MD  glimepiride (AMARYL) 2 MG tablet Take 2 mg by mouth daily with breakfast.   Yes [provider]  insulin aspart (NOVOLOG) 100 UNIT/ML injection Inject 0-12 Units into the skin See admin instructions. Per sliding scale; if BS <70, call MD,70-200=0 units, 201-250=2 units, 251-300=4 units, 301-350=6 units, 351-400=8 units, 401-450=10 units, >450=12 units   Yes [provider]  linagliptin (TRADJENTA) 5 MG TABS tablet Take 1 tablet (5 mg total) by mouth daily. 01/02/14  Yes Lauree Chandler, NP  metoprolol tartrate (LOPRESSOR) 25 MG tablet Take 0.5 tablets (12.5 mg total) by mouth 2 (two) times daily. 03/18/17  Yes Patrecia Pour, Christean Grief, MD  mirtazapine (REMERON) 15 MG tablet Take 15 mg by mouth at bedtime.   Yes [provider]  Multiple Vitamins-Minerals (DECUBI-VITE PO) Take 1 capsule by mouth daily.   Yes [provider]  oxybutynin (DITROPAN) 5 MG tablet Take 1 tablet (5 mg total) by mouth 3 (three) times daily as needed for bladder spasms (Up to 3 times daily to control bladder spasms.). 01/02/14  Yes Eubanks, Carlos American, NP  piperacillin-tazobactam (ZOSYN) 3.375 (3-0.375) g injection Inject 3.375 g into the muscle every 8 (eight) hours.   Yes [provider]  ranitidine (ZANTAC) 150 MG tablet Take 150 mg by mouth daily.   Yes [provider]  senna (SENOKOT) 8.6 MG TABS tablet Take 1 tablet by mouth daily as needed (constipation).   Yes [provider]  traMADol (ULTRAM) 50 MG tablet Take 1 tablet (50 mg total) by mouth every 12 (twelve) hours as needed for severe pain. 04/06/17  Yes Hongalgi, Lenis Dickinson, MD  acetaminophen (TYLENOL) 325 MG tablet Take 2 tablets (650 mg total) by mouth every 6 (six) hours as needed for mild pain, moderate pain, fever or headache. Patient not taking: Reported on 05/19/2017 04/06/17   Modena Jansky, MD  cephALEXin (KEFLEX) 500  MG capsule Take 1 capsule (500 mg total) by mouth every 12 (twelve) hours. Discontinue after 04/10/2017 doses. Patient not taking: Reported on 05/19/2017 04/06/17   Modena Jansky, MD  feeding supplement, GLUCERNA SHAKE, (GLUCERNA SHAKE) LIQD Take 237 mLs by mouth 2 (two) times daily between meals. Patient not taking: Reported on 03/31/2017 01/02/14   Lauree Chandler, NP  predniSONE (DELTASONE) 10 MG tablet Take 2 tablets (20 mg total) daily for 3 days, then 1 tablet daily (10 mg total) for 3 days, then half tablet (5 mg total) daily for 3 days, then as per SNF prior to this admission (if she was on prednisone 3 mg daily, continue same.  If prednisone had been discontinued, then may stop). Patient not taking: Reported on 05/19/2017 04/06/17   Modena Jansky, MD    Family History Family History  Problem Relation Age of Onset  . Cancer  Mother   . Cancer Father   . Cancer Brother   . Cancer Daughter   . Leukemia Unknown     Social History Social History   Tobacco Use  . Smoking status: Former Smoker    Packs/day: 2.00    Years: 30.00    Pack years: 60.00    Types: Cigarettes    Last attempt to quit: 01/11/1983    Years since quitting: 34.3  . Smokeless tobacco: Never Used  Substance Use Topics  . Alcohol use: No  . Drug use: No     Allergies   Allegra [fexofenadine hcl]; Levaquin [levofloxacin in d5w]; Tussionex pennkinetic er [employer] er]; Codeine; and Morphine and related   Review of Systems Review of Systems  Unable to perform ROS: Mental status change     Physical Exam Updated Vital Signs BP 98/60 (BP Location: Right Arm)   Pulse (!) 104   Temp (!) 100.6 F (38.1 C)   Resp (!) 23   Ht 5\' 1"  (1.549 m)   Wt 74.4 kg (164 lb)   SpO2 95%   BMI 30.99 kg/m   Physical Exam  Constitutional: She appears listless. She has a sickly appearance. No distress.  HENT:  Head: Normocephalic and atraumatic.  Eyes: Conjunctivae and EOM are normal.    Cardiovascular: An irregularly irregular rhythm present. Tachycardia present.  Pulmonary/Chest: No stridor. She has decreased breath sounds. She has rhonchi.  Weak cough effort, rales  Abdominal: She exhibits no distension.  Musculoskeletal: She exhibits no edema.  Neurological: She appears listless. She displays atrophy. She displays no tremor. No cranial nerve deficit. She displays no seizure activity.  Patient does not follow commands reliably, but does move all tremors spontaneously, speech is clear, though not appropriate. No facial asymmetry.  Skin: Skin is warm and dry.  Left PICC line unremarkable superficially  Psychiatric: She is withdrawn. Cognition and memory are impaired.  Nursing note and vitals reviewed.    ED Treatments / Results  Labs (all labs ordered are listed, but only abnormal results are displayed) Labs Reviewed  COMPREHENSIVE METABOLIC PANEL - Abnormal; Notable for the following components:      Result Value   Glucose, Bld 101 (*)    Calcium 8.6 (*)    Total Protein 5.6 (*)    Albumin 2.4 (*)    GFR calc non Af Amer 55 (*)    All other components within normal limits  CBC WITH DIFFERENTIAL/PLATELET - Abnormal; Notable for the following components:   RBC 3.55 (*)    Hemoglobin 11.4 (*)    MCV 102.0 (*)    RDW 15.6 (*)    Platelets 125 (*)    Monocytes Absolute 1.2 (*)    All other components within normal limits  PROTIME-INR - Abnormal; Notable for the following components:   Prothrombin Time 21.8 (*)    All other components within normal limits  URINALYSIS, COMPLETE (UACMP) WITH MICROSCOPIC - Abnormal; Notable for the following components:   APPearance HAZY (*)    Hgb urine dipstick MODERATE (*)    Ketones, ur 5 (*)    Leukocytes, UA TRACE (*)    Squamous Epithelial / LPF 0-5 (*)    All other components within normal limits  BRAIN NATRIURETIC PEPTIDE - Abnormal; Notable for the following components:   B Natriuretic Peptide 284.9 (*)    All  other components within normal limits  CBG MONITORING, ED - Abnormal; Notable for the following components:   Glucose-Capillary 102 (*)  All other components within normal limits  URINE CULTURE  INFLUENZA PANEL BY PCR (TYPE A & B)  I-STAT CG4 LACTIC ACID, ED    EKG  EKG Interpretation  Date/Time:  Friday May 19 2017 11:42:17 EDT Ventricular Rate:  100 PR Interval:    QRS Duration: 93 QT Interval:  325 QTC Calculation: 420 R Axis:   41 Text Interpretation:  Atrial fibrillation Ventricular premature complex Borderline T wave abnormalities Abnormal ekg Confirmed by Carmin Muskrat [phone]) on 05/19/2017 12:16:55 PM       Radiology Ct Head Wo Contrast  Result Date: 05/19/2017 CLINICAL DATA:  Increased lethargy EXAM: CT HEAD WITHOUT CONTRAST TECHNIQUE: Contiguous axial images were obtained from the base of the skull through the vertex without intravenous contrast. COMPARISON:  03/31/2017 FINDINGS: Brain: Mild atrophic and chronic white matter ischemic changes are noted. No findings to suggest acute hemorrhage, acute infarction or space-occupying mass lesion are noted. Vascular: No hyperdense vessel or unexpected calcification. Skull: Normal. Negative for fracture or focal lesion. Sinuses/Orbits: Opacification of the left maxillary antrum is noted stable from the prior exam. Air-fluid level is noted within the sphenoid sinus on the right. Fluid is noted within the mastoid air cells on the left. Other: None. IMPRESSION: Chronic changes in the left maxillary antrum. Fluid within the right sphenoid sinus and left mastoid air cells. Atrophic and ischemic changes without acute abnormality. Electronically Signed   By: Inez Catalina M.D.   On: 05/19/2017 14:54   Dg Chest Port 1 View  Result Date: 05/19/2017 CLINICAL DATA:  Loss of consciousness. EXAM: PORTABLE CHEST 1 VIEW COMPARISON:  Chest x-ray dated March 31, 2017. FINDINGS: Stable borderline cardiomegaly. Normal pulmonary vascularity.  Unchanged scarring/atelectasis at the left lung base. No focal consolidation, pleural effusion, or pneumothorax. No acute osseous abnormality. IMPRESSION: No active disease. Electronically Signed   By: Titus Dubin M.D.   On: 05/19/2017 12:18    Procedures Procedures (including critical care time)  Medications Ordered in ED Medications  sodium chloride 0.9 % bolus 1,000 mL (0 mLs Intravenous Stopped 05/19/17 1506)    And  0.9 %  sodium chloride infusion (125 mL/hr Intravenous New Bag/Given 05/19/17 1528)  sodium chloride flush (NS) 0.9 % injection 10-40 mL (not administered)  sodium chloride flush (NS) 0.9 % injection 10-40 mL (not administered)  vancomycin (VANCOCIN) IVPB 1000 mg/200 mL premix (not administered)  acetaminophen (TYLENOL) tablet 1,000 mg (1,000 mg Oral Given 05/19/17 1527)     Initial Impression / Assessment and Plan / ED Course  I have reviewed the triage vital signs and the nursing notes.  Pertinent labs & imaging results that were available during my care of the patient were reviewed by me and considered in my medical decision making (see chart for details).  Update: Patient's granddaughter is here about notes the patient is much less interactive, less verbal than usual, though she acknowledges the patient has multiple medical issues.  Remains in similar condition, listless, with occasional episodes of or appropriate orientation, but essentially disoriented, minimally interactive.      7:35 PM On repeat exam the patient is in similar condition, with borderline hypotension, increased respiratory rate  This elderly female with multiple medical issues, baseline dementia presents with decreased interactivity, and on initial exam has respiratory difficulty, tachypnea, cough.  She is moderately febrile. Patient received Zosyn prior to ED transfer, and broad differential including infection was considered. Labs surprisingly reassuring, with no leukocytosis, no lactic  acidosis. However, the patient was febrile, and given  concern for altered mental status, considerations include viral illness, line infection, with likely exacerbation by CHF/COPD. Patient received vancomycin, to complement Zosyn for antibiotic coverage, had fluids, provided judiciously given the patient's history of CHF.  Patient has received steroids given history of COPD, concern for exacerbation contributing to her illness. Patient required admission given the persistent hypotension, altered mental status, fever and  need for further evaluation and management.  Final Clinical Impressions(s) / ED Diagnoses  Fever Altered mental status   Carmin Muskrat, MD 05/19/17 1942    Carmin Muskrat, MD 05/19/17 [phone]

## 2017-05-19 NOTE — H&P (Signed)
History and Physical    [name]:[id number] DOB: 1925/02/22 DOA: 05/19/2017  Referring MD/NP/PA: Dr. Carmin Muskrat PCP: Patient, No Pcp Per  Patient coming from: Nursing facility via EMS  Chief Complaint: Change in mental status  I have personally briefly reviewed patient's old medical records in [place]   HPI: [name] is a 84 y.o. female with medical history significant of HTN, A. fib, chronic diastolic CHF, DM type II with neuropathy, COPD, temporal arteritis, PMR, CKD, ITP, DVT on Eliquis, and osteoporosis; who presented for change in mental status noted over the last day.  History is obtained from review of records as patient unable to provide her own at this time.  At baseline patient is noted to be conversant and over the last 24 hours have been more lethargic and less responsive.  Patient was hospitalized from 1/7-1/12 after suffering a fall with right femur fracture undergoing ORIF by Dr. Doreatha Martin with postop complication of acute right DVT with subsequent increased of Eliquis dose.  Patient was readmitted to the hospital from 1/25-1/31 suffering thought secondary to UTI treated initially with Rocephin then sent home initially on Keflex which she completed.  Review of records shows that patient was subsequently started on Augmentin on 3/7 and then switched to Zosyn.   ED Course: Upon admission into the emergency department patient was seen to be febrile up to 100.6 F, blood pressures 89/66-111/80, heart rates 30-105, respirations 18-24, and O2 saturations 94-98% on room air.  Labs revealed WBC 9.5, hemoglobin 11.4, platelets 125, BNP 284.9, lactic acid 1.34, influenza screen negative.  Urinalysis was also noted to be negative for any signs of infection.  CT scan of the brain showed chronic changes with no acute abnormalities.  Chest x-ray was otherwise noted to be clear.  Sepsis protocol was initiated with 1 L fluid bolus, empiric antibiotics of vancomycin as  patient had already been receiving Zosyn, and patient was given 10 mg of Decadron IV.  Review of Systems  Unable to perform ROS: Mental status change    Past Medical History:  Diagnosis Date  . Abnormality of gait   . Anemia, unspecified   . Anxiety state, unspecified   . Arthritis    BACK , NECK, AND JOINTS  . 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 CHF (congestive heart failure) ([place])   . Chronic cystitis   . Chronic steroid use   . COPD (chronic obstructive pulmonary disease) ([facility])    no inhalers used   . Depressive disorder, not elsewhere classified   . Dermatophytosis of nail   . Diabetic peripheral neuropathy ([place])   . Diaphragmatic hernia without mention of obstruction or gangrene   . Hemoperitoneum (nontraumatic)   . Herpes zoster with other nervous system complications(053.19)   . History of TIA (transient ischemic attack) 2012--  NO RESIDUAL  . HTN (hypertension)   . 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 Insulin dependent diabetes mellitus ([place])   . Macular degeneration of both eyes   . Malignant neoplasm of renal pelvis ([place])   . Osteoporosis   . Osteoporosis, unspecified   . Other and unspecified hyperlipidemia   . Personality change due to conditions classified elsewhere   . PONV (postoperative nausea and vomiting)    in past, none recently  . 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 Temporal arteritis ([place])   . Type I (juvenile type) diabetes mellitus without mention of complication, uncontrolled   .  Unspecified essential hypertension   . Unspecified late effects of cerebrovascular disease   . Urinary frequency     Past Surgical History:  Procedure Laterality Date  . ANTERIOR CERVICAL DECOMP/DISCECTOMY FUSION  10-12-2005   C5 -- C7  . CARPAL TUNNEL RELEASE Right 2009  (APPROX)  . CATARACT EXTRACTION W/ INTRAOCULAR LENS  IMPLANT, BILATERAL Right Oct 1992   Epes  . CATARACT EXTRACTION W/ INTRAOCULAR LENS  IMPLANT Left 1993   Epes  . CRYOABLATION RIGHT RENAL MASS  03-16-2012   ONCOCYTOMA (POST OP 06-09-2012 PERCUTANEOUS DRAIN PLACEMENT FOR PERINEPHRIC ABSCESS)  . CYSTOSCOPY W/ URETERAL STENT PLACEMENT Right 07/10/2012   Procedure: CYSTOSCOPY WITH RIGHT RETROGRADE PYELOGRAM/URETERAL STENT PLACEMENT;  Surgeon: Malka So, MD;  Location[facility];  Service: Urology;  Laterality: Right;  . ELBOW SURGERY Bilateral 2003  (APPROX)  . HIP ARTHROPLASTY Right 08/03/2012   Procedure: CEMENTED MONOPOLAR HIP;  Surgeon: Marybelle Killings, MD;  Location: [facility];  Service: Orthopedics;  Laterality: Right;  . LUMBAR LAMINECTOMY  11-19-2003   L3 -- L4  . ORIF FEMUR FRACTURE Right 03/15/2017   Procedure: OPEN REDUCTION INTERNAL FIXATION (ORIF) DISTAL FEMUR FRACTURE;  Surgeon: Shona Needles, MD;  Location: [place] [facility];  Service: Orthopedics;  Laterality: Right;  . ORIF W/ PLATE RIGHT DISTAL RADIUS FX  06-18-2003  . POSTERIOR FUSION CERVICAL SPINE  02-10-2006   C5 -- C7  . TEMPORAL ARTERY BIOPSY / LIGATION    . TOTAL HIP ARTHROPLASTY Left 01-22-2005  . TOTAL KNEE ARTHROPLASTY Bilateral LEFT 08-08-2007;  RIGHT 05-08-2009     reports that she quit smoking about 34 years ago. Her smoking use included cigarettes. She has a 60.00 pack-year smoking history. she has never used smokeless tobacco. She reports that she does not drink alcohol or use drugs.  Allergies  Allergen Reactions  . Allegra [Fexofenadine Hcl] Other (See Comments)    Unknown reaction  . Levaquin [Levofloxacin In D5w] Other (See Comments)    Unknown reaction  . Tussionex Pennkinetic Er [Hydrocod Polst-Cpm Polst Er] Nausea And Vomiting  . Codeine Nausea And Vomiting  . Morphine And Related Nausea And Vomiting    Family History  Problem Relation Age of Onset  . Cancer Mother   . Cancer Father   . Cancer Brother   . Cancer Daughter   . Leukemia Unknown     Prior to Admission medications   Medication Sig Start Date End Date Taking?  Authorizing Provider  apixaban (ELIQUIS) 5 MG TABS tablet Take 1 tablet (5 mg total) by mouth 2 (two) times daily. 04/06/17  Yes Hongalgi, Lenis Dickinson, MD  collagenase (SANTYL) ointment Apply 1 application topically daily. Apply a dime-sized thickness amount to entire wound bed daily   Yes [provider]  ferrous sulfate 325 (65 FE) MG tablet Take 1 tablet (325 mg total) by mouth 2 (two) times daily with a meal. 03/18/17  Yes Patrecia Pour, Christean Grief, MD  gabapentin (NEURONTIN) 300 MG capsule Take 1 capsule (300 mg total) by mouth 2 (two) times daily. Taking 1 tab in the am , and 2 tabs in the pm. Patient taking differently: Take 300-600 mg by mouth See admin instructions. 360m in am, 6086min pm 04/06/17  Yes Hongalgi, AnLenis DickinsonMD  glimepiride (AMARYL) 2 MG tablet Take 2 mg by mouth daily with breakfast.   Yes [provider]  insulin aspart (NOVOLOG) 100 UNIT/ML injection Inject 0-12 Units into the skin See admin instructions. Per sliding scale; if  BS <70, call MD,70-200=0 units, 201-250=2 units, 251-300=4 units, 301-350=6 units, 351-400=8 units, 401-450=10 units, >450=12 units   Yes [provider]  linagliptin (TRADJENTA) 5 MG TABS tablet Take 1 tablet (5 mg total) by mouth daily. 01/02/14  Yes Lauree Chandler, NP  metoprolol tartrate (LOPRESSOR) 25 MG tablet Take 0.5 tablets (12.5 mg total) by mouth 2 (two) times daily. 03/18/17  Yes Patrecia Pour, Christean Grief, MD  mirtazapine (REMERON) 15 MG tablet Take 15 mg by mouth at bedtime.   Yes [provider]  Multiple Vitamins-Minerals (DECUBI-VITE PO) Take 1 capsule by mouth daily.   Yes [provider]  oxybutynin (DITROPAN) 5 MG tablet Take 1 tablet (5 mg total) by mouth 3 (three) times daily as needed for bladder spasms (Up to 3 times daily to control bladder spasms.). 01/02/14  Yes Eubanks, Carlos American, NP  piperacillin-tazobactam (ZOSYN) 3.375 (3-0.375) g injection Inject 3.375 g into the muscle every 8 (eight) hours.    Yes [provider]  ranitidine (ZANTAC) 150 MG tablet Take 150 mg by mouth daily.   Yes [provider]  senna (SENOKOT) 8.6 MG TABS tablet Take 1 tablet by mouth daily as needed (constipation).   Yes [provider]  traMADol (ULTRAM) 50 MG tablet Take 1 tablet (50 mg total) by mouth every 12 (twelve) hours as needed for severe pain. 04/06/17  Yes Hongalgi, Lenis Dickinson, MD  acetaminophen (TYLENOL) 325 MG tablet Take 2 tablets (650 mg total) by mouth every 6 (six) hours as needed for mild pain, moderate pain, fever or headache. Patient not taking: Reported on 05/19/2017 04/06/17   Modena Jansky, MD  cephALEXin (KEFLEX) 500 MG capsule Take 1 capsule (500 mg total) by mouth every 12 (twelve) hours. Discontinue after 04/10/2017 doses. Patient not taking: Reported on 05/19/2017 04/06/17   Modena Jansky, MD  feeding supplement, GLUCERNA SHAKE, (GLUCERNA SHAKE) LIQD Take 237 mLs by mouth 2 (two) times daily between meals. Patient not taking: Reported on 03/31/2017 01/02/14   Lauree Chandler, NP  predniSONE (DELTASONE) 10 MG tablet Take 2 tablets (20 mg total) daily for 3 days, then 1 tablet daily (10 mg total) for 3 days, then half tablet (5 mg total) daily for 3 days, then as per SNF prior to this admission (if she was on prednisone 3 mg daily, continue same.  If prednisone had been discontinued, then may stop). Patient not taking: Reported on 05/19/2017 04/06/17   Modena Jansky, MD    Physical Exam:  Constitutional: Elderly female who appears to be sick, but in no acute distress at this time. Vitals:   05/19/17 1600 05/19/17 1630 05/19/17 1654 05/19/17 1930  BP: 100/68  98/60 (!) 89/66  Pulse: (!) 45 (!) 104  (!) 53  Resp: (!) 22 (!) 23  (!) 22  Temp:      TempSrc:      SpO2: 94% 95%  95%  Weight:      Height:       Eyes: PERRL, lids and conjunctivae normal ENMT: Mucous membranes are dry. Posterior pharynx clear of any exudate or lesions.Normal dentition.  Neck:  normal, supple, no masses, no thyromegaly Respiratory: clear to auscultation bilaterally, no wheezing, no crackles. Normal respiratory effort. No accessory muscle use.  Cardiovascular: Irregular irregular, no murmurs / rubs / gallops. No extremity edema. 2+ pedal pulses. No carotid bruits.  Abdomen: no tenderness, no masses palpated. No hepatosplenomegaly. Bowel sounds positive.  Musculoskeletal: no clubbing / cyanosis. Skin: no rashes,  lesions, ulcers. No induration Neurologic: CN 2-12 grossly intact.  Able to move all extremities Psychiatric: Confused, but oriented to self   Labs on Admission: I have personally reviewed following labs and imaging studies  CBC: Recent Labs  Lab 05/19/17 1225  WBC 9.5  NEUTROABS 5.7  HGB 11.4*  HCT 36.2  MCV 102.0*  PLT 161*   Basic Metabolic Panel: Recent Labs  Lab 05/19/17 1225  NA 135  K 3.9  CL 101  CO2 24  GLUCOSE 101*  BUN 13  CREATININE 0.90  CALCIUM 8.6*   GFR: Estimated Creatinine Clearance: 38.3 mL/min (by C-G formula based on SCr of 0.9 mg/dL). Liver Function Tests: Recent Labs  Lab 05/19/17 1225  AST 32  ALT 26  ALKPHOS 85  BILITOT 1.0  PROT 5.6*  ALBUMIN 2.4*   No results for input(s): LIPASE, AMYLASE in the last 168 hours. No results for input(s): AMMONIA in the last 168 hours. Coagulation Profile: Recent Labs  Lab 05/19/17 1225  INR 1.92   Cardiac Enzymes: No results for input(s): CKTOTAL, CKMB, CKMBINDEX, TROPONINI in the last 168 hours. BNP (last 3 results) No results for input(s): PROBNP in the last 8760 hours. HbA1C: No results for input(s): HGBA1C in the last 72 hours. CBG: Recent Labs  Lab 05/19/17 1238  GLUCAP 102*   Lipid Profile: No results for input(s): CHOL, HDL, LDLCALC, TRIG, CHOLHDL, LDLDIRECT in the last 72 hours. Thyroid Function Tests: No results for input(s): TSH, T4TOTAL, FREET4, T3FREE, THYROIDAB in the last 72 hours. Anemia Panel: No results for input(s): VITAMINB12, FOLATE,  FERRITIN, TIBC, IRON, RETICCTPCT in the last 72 hours. Urine analysis:    Component Value Date/Time   COLORURINE YELLOW 05/19/2017 1205   APPEARANCEUR HAZY (A) 05/19/2017 1205   APPEARANCEUR Clear 05/07/2013 1436   LABSPEC 1.025 05/19/2017 1205   PHURINE 5.0 05/19/2017 1205   GLUCOSEU NEGATIVE 05/19/2017 1205   HGBUR MODERATE (A) 05/19/2017 1205   BILIRUBINUR NEGATIVE 05/19/2017 1205   BILIRUBINUR Neg 12/11/2013 1345   BILIRUBINUR Negative 05/07/2013 1436   KETONESUR 5 (A) 05/19/2017 1205   PROTEINUR NEGATIVE 05/19/2017 1205   UROBILINOGEN 0.2 12/11/2013 1345   UROBILINOGEN 1.0 11/16/2013 1256   NITRITE NEGATIVE 05/19/2017 1205   LEUKOCYTESUR TRACE (A) 05/19/2017 1205   LEUKOCYTESUR Negative 05/07/2013 1436   Sepsis Labs: No results found for this or any previous visit (from the past 240 hour(s)).   Radiological Exams on Admission: Ct Head Wo Contrast  Result Date: 05/19/2017 CLINICAL DATA:  Increased lethargy EXAM: CT HEAD WITHOUT CONTRAST TECHNIQUE: Contiguous axial images were obtained from the base of the skull through the vertex without intravenous contrast. COMPARISON:  03/31/2017 FINDINGS: Brain: Mild atrophic and chronic white matter ischemic changes are noted. No findings to suggest acute hemorrhage, acute infarction or space-occupying mass lesion are noted. Vascular: No hyperdense vessel or unexpected calcification. Skull: Normal. Negative for fracture or focal lesion. Sinuses/Orbits: Opacification of the left maxillary antrum is noted stable from the prior exam. Air-fluid level is noted within the sphenoid sinus on the right. Fluid is noted within the mastoid air cells on the left. Other: None. IMPRESSION: Chronic changes in the left maxillary antrum. Fluid within the right sphenoid sinus and left mastoid air cells. Atrophic and ischemic changes without acute abnormality. Electronically Signed   By: Inez Catalina M.D.   On: 05/19/2017 14:54   Dg Chest Port 1 View  Result  Date: 05/19/2017 CLINICAL DATA:  Loss of consciousness. EXAM: PORTABLE CHEST 1 VIEW COMPARISON:  Chest x-ray dated March 31, 2017. FINDINGS: Stable borderline cardiomegaly. Normal pulmonary vascularity. Unchanged scarring/atelectasis at the left lung base. No focal consolidation, pleural effusion, or pneumothorax. No acute osseous abnormality. IMPRESSION: No active disease. Electronically Signed   By: Titus Dubin M.D.   On: 05/19/2017 12:18    EKG: Independently reviewed.  Atrial fibrillation at 100 bpm  Assessment/Plan Sepsis, with unknown source: Acute.  Patient presented initially febrile at 100.6 F with tachycardia and tachypnea.  Lactic acid noted to be reassuring at 1.34.  Urinalysis and chest x-ray did not show any clear signs of infection.  Question PICC line as possible source of infection.  - Admit to telemetry bed - Check CRP - Follow-up blood cultures and consider need of removal of PICC line - Empiric antibiotics of vancomycin and Zosyn  Acute encephalopathy: Suspect secondary to above - neuro checks  Paroxysmal atrial fibrillation: Patient currently in atrial fibrillation with heart rates in the low 100s. - Continue metoprolol as tolerated and Eliquis   Diastolic CHF: Patient does not appear to be acutely overloaded at this time. - Strict I&O's  - daily weights  Essential hypertension: Noted to have some soft blood pressures, but with MAPs maintained. - Metoprolol as tolerated  Diabetes mellitus type 2 with peripheral neuropathy: Patient's initial blood sugar noted to be 101 on admission.  Last hemoglobin A1c on file from 11/2013 noted to be 7.3 at that time. - Hypoglycemic protocols - Hold Amaryl and Tradjenta - Continue gabapentin and home sliding scale insulin regimen  Macrocytic anemia: Chronic.  Hemoglobin appears to be improved from previous at 11.4.  During last hospitalization required transfusion of 1 unit of PRBCs on 1/28. - Recheck CBC in a.m.  -  Continue ferrous sulfate  History of polymyalgia rheumatica - Check ESR  Thrombocytopenia: Chronic - Continue to monitor platelets  DVT prophylaxis: Eliquis Code Status: DNR Family Communication: No family present at bedside Disposition Plan: Likely discharge back to skilled nursing facility once medically stable Consults called: None Admission status: Inpatient  Norval Morton MD [facility] Pager [phone]   If 7PM-7AM, please contact night-coverage [email or web address] Password [facility]  05/19/2017, 8:08 PM

## 2017-05-19 NOTE — ED Notes (Signed)
Temp 97.6

## 2017-05-19 NOTE — ED Triage Notes (Addendum)
Pt here via [facility] from [facility] for increased lethargy within the last several hours.  Normally can converse, but now only able to say name. Recently tx with antibiotics for pneumonia and uti.  Given IV zosyn at 12 am per L picc.

## 2017-05-19 NOTE — ED Notes (Signed)
Admitting provider at bedside. PT resting, pt cleaned and linen changed.

## 2017-05-19 NOTE — Progress Notes (Signed)
Pharmacy Antibiotic Note  [name] is a 84 y.o. female who presented to the [facility] on 05/19/2017 with AMS and concern for sepsis. Pharmacy has been consulted for Vancomycin + Zosyn dosing.  Vancomycin 1g IV x 1 has already been given in the [facility]. SCr 0.9, CrCl~35-40 ml/min.   Plan: 1. Vancomycin 1g IV every 24 hours 2. Zosyn 3.375g IV every 8 hours 3. Will continue to follow renal function, culture results, LOT, and antibiotic de-escalation plans   Height: 5\' 1"  (154.9 cm) Weight: 164 lb (74.4 kg) IBW/kg (Calculated) : 47.8  Temp (24hrs), Avg:100.6 F (38.1 C), Min:100.6 F (38.1 C), Max:100.6 F (38.1 C)  Recent Labs  Lab 05/19/17 1225 05/19/17 1302  WBC 9.5  --   CREATININE 0.90  --   LATICACIDVEN  --  1.34    Estimated Creatinine Clearance: 38.3 mL/min (by C-G formula based on SCr of 0.9 mg/dL).    Allergies  Allergen Reactions  . Allegra [Fexofenadine Hcl] Other (See Comments)    Unknown reaction  . Levaquin [Levofloxacin In D5w] Other (See Comments)    Unknown reaction  . Tussionex Pennkinetic Er [Hydrocod Polst-Cpm Polst Er] Nausea And Vomiting  . Codeine Nausea And Vomiting  . Morphine And Related Nausea And Vomiting    Antimicrobials this admission: Vanc 3/15 >> Zosyn 3/15 >>  Dose adjustments this admission: n/a  Microbiology results: 3/15 UCx >>  Thank you for allowing pharmacy to be a part of this patient's care.  Alycia Rossetti, PharmD, BCPS Clinical Pharmacist Pager: [phone] Clinical phone for 05/19/2017: [phone] 05/19/2017 9:50 PM

## 2017-05-20 DIAGNOSIS — R509 Fever, unspecified: Secondary | ICD-10-CM

## 2017-05-20 LAB — URINE CULTURE

## 2017-05-20 LAB — GLUCOSE, CAPILLARY
GLUCOSE-CAPILLARY: 281 mg/dL — AB (ref 65–99)
GLUCOSE-CAPILLARY: 246 mg/dL — AB (ref 65–99)
Glucose-Capillary: 272 mg/dL — ABNORMAL HIGH (ref 65–99)

## 2017-05-20 LAB — VITAMIN B12: Vitamin B-12: 929 pg/mL — ABNORMAL HIGH (ref 180–914)

## 2017-05-20 LAB — BASIC METABOLIC PANEL
Anion gap: 14 (ref 5–15)
BUN: 14 mg/dL (ref 6–20)
CO2: 20 mmol/L — ABNORMAL LOW (ref 22–32)
Calcium: 8.7 mg/dL — ABNORMAL LOW (ref 8.9–10.3)
Chloride: 105 mmol/L (ref 101–111)
Creatinine, Ser: 0.94 mg/dL (ref 0.44–1.00)
GFR calc Af Amer: >60 mL/min (ref >60)
GFR, EST NON AFRICAN AMERICAN: 52 mL/min — AB (ref >60)
GLUCOSE: 175 mg/dL — AB (ref 65–99)
POTASSIUM: 4.7 mmol/L (ref 3.5–5.1)
Sodium: 139 mmol/L (ref 135–145)

## 2017-05-20 LAB — CBC
HCT: 37.8 % (ref 36.0–46.0)
Hemoglobin: 11.9 g/dL — ABNORMAL LOW (ref 12.0–15.0)
MCH: 32.4 pg (ref 26.0–34.0)
MCHC: 31.5 g/dL (ref 30.0–36.0)
MCV: 103 fL — AB (ref 78.0–100.0)
PLATELETS: 120 10*3/uL — AB (ref 150–400)
RBC: 3.67 MIL/uL — AB (ref 3.87–5.11)
RDW: 15.8 % — AB (ref 11.5–15.5)
WBC: 5.9 10*3/uL (ref 4.0–10.5)

## 2017-05-20 LAB — MRSA PCR SCREENING: MRSA BY PCR: NEGATIVE

## 2017-05-20 LAB — C-REACTIVE PROTEIN: CRP: 10.1 mg/dL — ABNORMAL HIGH (ref <1.0)

## 2017-05-20 MED ORDER — METOPROLOL TARTRATE 5 MG/5ML IV SOLN
5.0000 mg | Freq: Once | INTRAVENOUS | Status: AC
  Administered 2017-05-20: 5 mg via INTRAVENOUS
  Filled 2017-05-20: qty 5

## 2017-05-20 MED ORDER — PREDNISONE 20 MG PO TABS
20.0000 mg | ORAL_TABLET | Freq: Every day | ORAL | Status: DC
  Administered 2017-05-20 – 2017-05-26 (×7): 20 mg via ORAL
  Filled 2017-05-20 (×7): qty 1

## 2017-05-20 MED ORDER — SODIUM CHLORIDE 0.9 % IV BOLUS (SEPSIS)
500.0000 mL | Freq: Once | INTRAVENOUS | Status: AC
  Administered 2017-05-20: 500 mL via INTRAVENOUS

## 2017-05-20 MED ORDER — PIPERACILLIN-TAZOBACTAM 3.375 G IVPB
3.3750 g | Freq: Three times a day (TID) | INTRAVENOUS | Status: DC
  Administered 2017-05-20 – 2017-05-23 (×9): 3.375 g via INTRAVENOUS
  Filled 2017-05-20 (×10): qty 50

## 2017-05-20 NOTE — Progress Notes (Signed)
[facility]                                                                              Patient Demographics  [name], is a 84 y.o. female, DOB - 1924/11/01, UJW:[id number]  Admit date - 05/19/2017   Admitting Physician Norval Morton, MD  Outpatient Primary MD for the patient is Patient, No Pcp Per  Outpatient specialists:   LOS - 1  days   Medical records reviewed and are as summarized below:    Chief Complaint  Patient presents with  . Altered Mental Status       Brief summary   Patient is a 84 year old female with  HTN, A. fib, chronic diastolic CHF, DM type II with neuropathy, COPD, temporal arteritis, PMR, CKD, ITP, DVT on Eliquis, and osteoporosis; who presented for change in mental status noted over the last day.At baseline patient is noted to be conversant and over the last 24 hours have been more lethargic and less responsive.  Patient was hospitalized from 1/7-1/12 after suffering a fall with right femur fracture undergoing ORIF by Dr. Doreatha Martin with postop complication of acute right DVT with subsequent increased of Eliquis dose.  Patient was readmitted to the hospital from 1/25-1/31 suffering thought secondary to UTI treated initially with Rocephin then sent home initially on Keflex which she completed.  Review of records shows that patient was subsequently started on Augmentin on 3/7 and then switched to Zosyn. In ED, temp of 100.6, BP low in 80s, heart rate up to 105, respirations 18-24, lactic acid 1.34, influenza negative chest x-ray clear.  Patient was admitted for sepsis with unclear etiology   Assessment & Plan    Principal Problem:   Sepsis ([place]) -Unclear etiology, patient met sepsis criteria at the time of admission with fever of 100.6, hypotension, tachycardia however source unclear.  UA negative, chest x-ray showed no consolidation or infiltrates.  Influenza panel negative -Follow blood cultures, if positive, will need to remove PICC  line - For now continue with IV vancomycin and Zosyn   Active Problems: Acute metabolic encephalopathy - Likely due to #1, CT head negative for any acute Intracranial pathology  Essential hypertension -BP now improving, continue metoprolol   Polymyalgia rheumatica ([place]) -Per previous DC summary from 03/2017, patient was on home dose of prednisone 3 mg daily, then was on prednisone taper.  Unclear if patient is still on prednisone maintenance dose.,  Will have pharmacy verify prednisone and why patient has PICC.  -ESR, CRP elevated, restart prednisone, for now place on prednisone 20 mg daily.  Received Decadron 10 mg IV x1 in ED  Chronic macrocytic anemia -B12 929 and normal limits, obtain folate folic acid in process     AF (paroxysmal atrial fibrillation) (HCC) -Currently rate controlled, continue metoprolol -Continue Eliquis    Type 2 diabetes mellitus with renal manifestations (HCC) -Continue sliding scale insulin, hold Amaryl, tradjenta  History of chronic diastolic CHF Not in any fluid overload, continue beta-blocker No recent echocardiogram, 2D echo in 2012 with EF of 65 to 70% with grade 1 diastolic dysfunction  Chronic thrombocytopenia - monitor counts   Code Status:  DNR  DVT Prophylaxis: apixaban Family Communication: No family member at the bedside   Disposition Plan: Back to skilled nursing facility when medically stable  Time Spent in minutes  25 minutes  Procedures:  None  Consultants:   None  Antimicrobials:   IV vancomycin 3/15  IV Zosyn 3/15   Medications  Scheduled Meds: . apixaban  5 mg Oral BID  . collagenase  1 application Topical Daily  . famotidine  20 mg Oral Daily  . feeding supplement (GLUCERNA SHAKE)  237 mL Oral BID BM  . ferrous sulfate  325 mg Oral BID WC  . gabapentin  300 mg Oral Daily  . gabapentin  600 mg Oral QHS  . insulin aspart  0-12 Units Subcutaneous TID AC  . metoprolol tartrate  12.5 mg Oral BID  . mirtazapine   15 mg Oral QHS  . sodium chloride flush  10-40 mL Intracatheter Q12H   Continuous Infusions: . sodium chloride 75 mL/hr at 05/20/17 0044  . piperacillin-tazobactam (ZOSYN)  IV 3.375 g (05/20/17 1029)  . vancomycin     PRN Meds:.acetaminophen **OR** acetaminophen, ipratropium-albuterol, ondansetron **OR** ondansetron (ZOFRAN) IV, oxybutynin, senna, sodium chloride flush   Antibiotics   Anti-infectives (From admission, onward)   Start     Dose/Rate Route Frequency Ordered Stop   05/20/17 2100  vancomycin (VANCOCIN) IVPB 1000 mg/200 mL premix     1,000 mg 200 mL/hr over 60 Minutes Intravenous Every 24 hours 05/19/17 2148     05/20/17 1000  piperacillin-tazobactam (ZOSYN) IVPB 3.375 g     3.375 g 12.5 mL/hr over 240 Minutes Intravenous Every 8 hours 05/20/17 0957     05/20/17 0600  piperacillin-tazobactam (ZOSYN) IVPB 3.375 g  Status:  Discontinued     3.375 g 12.5 mL/hr over 240 Minutes Intravenous Every 8 hours 05/19/17 2147 05/20/17 0957   05/19/17 2200  piperacillin-tazobactam (ZOSYN) injection 3.375 g  Status:  Discontinued     3.375 g Intramuscular Every 8 hours 05/19/17 2134 05/19/17 2142   05/19/17 2145  piperacillin-tazobactam (ZOSYN) IVPB 3.375 g     3.375 g 100 mL/hr over 30 Minutes Intravenous  Once 05/19/17 2143 05/20/17 0130   05/19/17 1945  vancomycin (VANCOCIN) IVPB 1000 mg/200 mL premix     1,000 mg 200 mL/hr over 60 Minutes Intravenous  Once 05/19/17 1935 05/19/17 2114        Subjective:   Deandra Goering was seen and examined today.  Difficult to obtain review of system from the patient, alert and awake, oriented to self, denies any specific complaints, feeling cold.   Objective:   Vitals:   05/19/17 2230 05/19/17 2328 05/20/17 0234 05/20/17 0558  BP: 105/72 111/63  115/65  Pulse: (!) 109 62  60  Resp: '19 18  14  ' Temp:  98.9 F (37.2 C)  98.6 F (37 C)  TempSrc:  Oral  Oral  SpO2: 96% 97%    Weight:   74.4 kg (164 lb 0.4 oz)   Height:         Intake/Output Summary (Last 24 hours) at 05/20/2017 1141 Last data filed at 05/20/2017 0603 Gross per 24 hour  Intake 1895 ml  Output 400 ml  Net 1495 ml     Wt Readings from Last 3 Encounters:  05/20/17 74.4 kg (164 lb 0.4 oz)  04/05/17 74.7 kg (164 lb 10.9 oz)  03/15/17 65.3 kg (144 lb)     Exam  General: Alert and oriented x 1 to self, NAD  Eyes:  HEENT:    Cardiovascular: S1 S2 auscultated, irregularly regular.  Respiratory: Clear anteriorly, no wheezing  Gastrointestinal: Soft, nontender, nondistended, + bowel sounds  Ext: no pedal edema bilaterally, PICC line in the left UE  Neuro: moving all 4 extremities however difficult to assess with the mental status  Musculoskeletal: No digital cyanosis, clubbing  Skin: No rashes  Psych: Normal affect and demeanor, alert and oriented x3    Data Reviewed:  I have personally reviewed following labs and imaging studies  Micro Results Recent Results (from the past 240 hour(s))  Urine culture     Status: Abnormal   Collection Time: 05/19/17 12:10 PM  Result Value Ref Range Status   Specimen Description URINE, RANDOM  Final   Special Requests NONE  Final   Culture (A)  Final    <10,000 COLONIES/mL INSIGNIFICANT GROWTH Performed at [facility], [address], [place], [place] [address]    Report Status 05/20/2017 FINAL  Final  MRSA PCR Screening     Status: None   Collection Time: 05/20/17  2:12 AM  Result Value Ref Range Status   MRSA by PCR NEGATIVE NEGATIVE Final    Comment:        The GeneXpert MRSA Assay (FDA approved for NASAL specimens only), is one component of a comprehensive MRSA colonization surveillance program. It is not intended to diagnose MRSA infection nor to guide or monitor treatment for MRSA infections. Performed at [facility], [place] [address] [place], [place] [address]     Radiology Reports Ct Head Wo Contrast  Result Date: 05/19/2017 CLINICAL DATA:  Increased  lethargy EXAM: CT HEAD WITHOUT CONTRAST TECHNIQUE: Contiguous axial images were obtained from the base of the skull through the vertex without intravenous contrast. COMPARISON:  03/31/2017 FINDINGS: Brain: Mild atrophic and chronic white matter ischemic changes are noted. No findings to suggest acute hemorrhage, acute infarction or space-occupying mass lesion are noted. Vascular: No hyperdense vessel or unexpected calcification. Skull: Normal. Negative for fracture or focal lesion. Sinuses/Orbits: Opacification of the left maxillary antrum is noted stable from the prior exam. Air-fluid level is noted within the sphenoid sinus on the right. Fluid is noted within the mastoid air cells on the left. Other: None. IMPRESSION: Chronic changes in the left maxillary antrum. Fluid within the right sphenoid sinus and left mastoid air cells. Atrophic and ischemic changes without acute abnormality. Electronically Signed   By: Inez Catalina M.D.   On: 05/19/2017 14:54   Dg Chest Port 1 View  Result Date: 05/19/2017 CLINICAL DATA:  Loss of consciousness. EXAM: PORTABLE CHEST 1 VIEW COMPARISON:  Chest x-ray dated March 31, 2017. FINDINGS: Stable borderline cardiomegaly. Normal pulmonary vascularity. Unchanged scarring/atelectasis at the left lung base. No focal consolidation, pleural effusion, or pneumothorax. No acute osseous abnormality. IMPRESSION: No active disease. Electronically Signed   By: Titus Dubin M.D.   On: 05/19/2017 12:18    Lab Data:  CBC: Recent Labs  Lab 05/19/17 1225 05/20/17 0413  WBC 9.5 5.9  NEUTROABS 5.7  --   HGB 11.4* 11.9*  HCT 36.2 37.8  MCV 102.0* 103.0*  PLT 125* 353*   Basic Metabolic Panel: Recent Labs  Lab 05/19/17 1225 05/20/17 0413  NA 135 139  K 3.9 4.7  CL 101 105  CO2 24 20*  GLUCOSE 101* 175*  BUN 13 14  CREATININE 0.90 0.94  CALCIUM 8.6* 8.7*   GFR: Estimated Creatinine Clearance: 36.7 mL/min (by C-G formula based on SCr of 0.94  mg/dL). Liver Function  Tests: Recent Labs  Lab 05/19/17 1225  AST 32  ALT 26  ALKPHOS 85  BILITOT 1.0  PROT 5.6*  ALBUMIN 2.4*   No results for input(s): LIPASE, AMYLASE in the last 168 hours. No results for input(s): AMMONIA in the last 168 hours. Coagulation Profile: Recent Labs  Lab 05/19/17 1225  INR 1.92   Cardiac Enzymes: No results for input(s): CKTOTAL, CKMB, CKMBINDEX, TROPONINI in the last 168 hours. BNP (last 3 results) No results for input(s): PROBNP in the last 8760 hours. HbA1C: No results for input(s): HGBA1C in the last 72 hours. CBG: Recent Labs  Lab 05/19/17 1238 05/20/17 1139  GLUCAP 102* 272*   Lipid Profile: No results for input(s): CHOL, HDL, LDLCALC, TRIG, CHOLHDL, LDLDIRECT in the last 72 hours. Thyroid Function Tests: No results for input(s): TSH, T4TOTAL, FREET4, T3FREE, THYROIDAB in the last 72 hours. Anemia Panel: Recent Labs    05/20/17 0413  VITAMINB12 929*   Urine analysis:    Component Value Date/Time   COLORURINE YELLOW 05/19/2017 1205   APPEARANCEUR HAZY (A) 05/19/2017 1205   APPEARANCEUR Clear 05/07/2013 1436   LABSPEC 1.025 05/19/2017 1205   PHURINE 5.0 05/19/2017 1205   GLUCOSEU NEGATIVE 05/19/2017 1205   HGBUR MODERATE (A) 05/19/2017 1205   BILIRUBINUR NEGATIVE 05/19/2017 1205   BILIRUBINUR Neg 12/11/2013 1345   BILIRUBINUR Negative 05/07/2013 1436   KETONESUR 5 (A) 05/19/2017 1205   PROTEINUR NEGATIVE 05/19/2017 1205   UROBILINOGEN 0.2 12/11/2013 1345   UROBILINOGEN 1.0 11/16/2013 1256   NITRITE NEGATIVE 05/19/2017 1205   LEUKOCYTESUR TRACE (A) 05/19/2017 1205   LEUKOCYTESUR Negative 05/07/2013 1436     Ripudeep Rai M.D. [facility] 05/20/2017, 11:41 AM  Pager: [phone] Between 7am to 7pm - call Pager - [phone]  After 7pm go to [email or web address] - password [email or web address]  Call night coverage person covering after 7pm

## 2017-05-20 NOTE — Progress Notes (Signed)
Pharmacy Antibiotic Note  [name] is a 84 y.o. female who presented to the [facility] on 05/19/2017 with AMS and concern for sepsis. Pharmacy has been consulted for Vancomycin + Zosyn dosing. Called RN to notify that the next dose for Zosyn was due at 0600 on 3/16 (never charted as given). Dispensed new bag to be given now.   WBC wnl, Tmax 100.6, but now afebrile. SCr 0.94, CrCl~35-40 ml/min. PCT 0.17. MRSA PCR negative.  Plan: 1. Vancomycin 1g IV every 24 hours - consider stopping soon if clinically appropriate 2. Zosyn 3.375g IV every 8 hours 3. Will continue to follow renal function, culture results, LOT, and antibiotic de-escalation plans   Height: 5\' 1"  (154.9 cm) Weight: 164 lb 0.4 oz (74.4 kg) IBW/kg (Calculated) : 47.8  Temp (24hrs), Avg:99.7 F (37.6 C), Min:98.6 F (37 C), Max:100.6 F (38.1 C)  Recent Labs  Lab 05/19/17 1225 05/19/17 1302 05/20/17 0413  WBC 9.5  --  5.9  CREATININE 0.90  --  0.94  LATICACIDVEN  --  1.34  --     Estimated Creatinine Clearance: 36.7 mL/min (by C-G formula based on SCr of 0.94 mg/dL).    Allergies  Allergen Reactions  . Allegra [Fexofenadine Hcl] Other (See Comments)    Unknown reaction  . Levaquin [Levofloxacin In D5w] Other (See Comments)    Unknown reaction  . Tussionex Pennkinetic Er [Hydrocod Polst-Cpm Polst Er] Nausea And Vomiting  . Codeine Nausea And Vomiting  . Morphine And Related Nausea And Vomiting    Antimicrobials this admission: Vanc 3/15 >> Zosyn 3/15 >>  Dose adjustments this admission: n/a  Microbiology results: 3/15 UCx >> 3/15 MRSA PCR negative  Thank you for allowing pharmacy to be a part of this patient's care.  Nida Boatman, PharmD PGY1 Acute Care Pharmacy Resident Pager: [phone] 05/20/2017 9:52 AM

## 2017-05-20 NOTE — Progress Notes (Signed)
Pharmacist - Physician Communication  Pharmacy was consulted to verify why patient has PICC line and if patient was receiving steroids at previous SNF for her polymyalgia rheumatica. Unclear if patient was still on maintenance dose of prednisone 3 mg daily.   I called [facility]. The nurse, Ivin Booty, stated that patient has been with them for about 1.5 months. The nurse checked the medical record, and patient has not been on any steroids since living at the SNF. Per MD and labs, CRP, ESR elevated most likely due to flare of PMR, suggestive of no steroids.  Also spoke to RN in the hospital to verify why patient has PICC line, and I was told the overnight report was because she had been receiving IV antibiotics on the outpatient side. Both the MD and myself, had no evidence for the IV antibiotics based on the PTA meds. MD suspects central line may be source for infection. Plan is to remove the potential source of infection. Will follow up blood cultures and continue Vancomycin and Zosyn for now.   Nida Boatman, PharmD PGY1 Acute Care Pharmacy Resident Pager: [phone] 05/20/2017 12:44 PM

## 2017-05-21 LAB — BASIC METABOLIC PANEL
Anion gap: 7 (ref 5–15)
BUN: 22 mg/dL — AB (ref 6–20)
CALCIUM: 8.5 mg/dL — AB (ref 8.9–10.3)
CO2: 23 mmol/L (ref 22–32)
CREATININE: 0.92 mg/dL (ref 0.44–1.00)
Chloride: 109 mmol/L (ref 101–111)
GFR calc non Af Amer: 53 mL/min — ABNORMAL LOW (ref >60)
Glucose, Bld: 304 mg/dL — ABNORMAL HIGH (ref 65–99)
Potassium: 4.5 mmol/L (ref 3.5–5.1)
Sodium: 139 mmol/L (ref 135–145)

## 2017-05-21 LAB — GLUCOSE, CAPILLARY
GLUCOSE-CAPILLARY: 261 mg/dL — AB (ref 65–99)
GLUCOSE-CAPILLARY: 273 mg/dL — AB (ref 65–99)
Glucose-Capillary: 207 mg/dL — ABNORMAL HIGH (ref 65–99)
Glucose-Capillary: 171 mg/dL — ABNORMAL HIGH (ref 65–99)

## 2017-05-21 LAB — CBC
HCT: 32.1 % — ABNORMAL LOW (ref 36.0–46.0)
Hemoglobin: 10.3 g/dL — ABNORMAL LOW (ref 12.0–15.0)
MCH: 32.5 pg (ref 26.0–34.0)
MCHC: 32.1 g/dL (ref 30.0–36.0)
MCV: 101.3 fL — ABNORMAL HIGH (ref 78.0–100.0)
Platelets: 136 10*3/uL — ABNORMAL LOW (ref 150–400)
RBC: 3.17 MIL/uL — ABNORMAL LOW (ref 3.87–5.11)
RDW: 15.7 % — AB (ref 11.5–15.5)
WBC: 8.7 10*3/uL (ref 4.0–10.5)

## 2017-05-21 LAB — HEMOGLOBIN A1C
Hgb A1c MFr Bld: 5.1 % (ref 4.8–5.6)
MEAN PLASMA GLUCOSE: 99.67 mg/dL

## 2017-05-21 MED ORDER — INSULIN ASPART 100 UNIT/ML ~~LOC~~ SOLN
0.0000 [IU] | Freq: Three times a day (TID) | SUBCUTANEOUS | Status: DC
  Administered 2017-05-21: 5 [IU] via SUBCUTANEOUS
  Administered 2017-05-21: 8 [IU] via SUBCUTANEOUS
  Administered 2017-05-22: 2 [IU] via SUBCUTANEOUS
  Administered 2017-05-22: 3 [IU] via SUBCUTANEOUS
  Administered 2017-05-22: 5 [IU] via SUBCUTANEOUS
  Administered 2017-05-23 – 2017-05-24 (×2): 3 [IU] via SUBCUTANEOUS
  Administered 2017-05-24: 2 [IU] via SUBCUTANEOUS
  Administered 2017-05-24 – 2017-05-25 (×2): 8 [IU] via SUBCUTANEOUS
  Administered 2017-05-25 – 2017-05-26 (×2): 2 [IU] via SUBCUTANEOUS
  Administered 2017-05-26: 6 [IU] via SUBCUTANEOUS
  Administered 2017-05-26: 8 [IU] via SUBCUTANEOUS

## 2017-05-21 MED ORDER — INSULIN ASPART 100 UNIT/ML ~~LOC~~ SOLN
4.0000 [IU] | Freq: Three times a day (TID) | SUBCUTANEOUS | Status: DC
  Administered 2017-05-21 – 2017-05-22 (×4): 4 [IU] via SUBCUTANEOUS

## 2017-05-21 MED ORDER — INSULIN ASPART 100 UNIT/ML ~~LOC~~ SOLN
0.0000 [IU] | Freq: Every day | SUBCUTANEOUS | Status: DC
  Administered 2017-05-23: 2 [IU] via SUBCUTANEOUS

## 2017-05-21 NOTE — Progress Notes (Signed)
[facility]                                                                              Patient Demographics  [name], is a 84 y.o. female, DOB - 07/14/1924, EXB:[id number]  Admit date - 05/19/2017   Admitting Physician Norval Morton, MD  Outpatient Primary MD for the patient is Patient, No Pcp Per  Outpatient specialists:   LOS - 2  days   Medical records reviewed and are as summarized below:    Chief Complaint  Patient presents with  . Altered Mental Status       Brief summary   Patient is a 84 year old female with  HTN, A. fib, chronic diastolic CHF, DM type II with neuropathy, COPD, temporal arteritis, PMR, CKD, ITP, DVT on Eliquis, and osteoporosis; who presented for change in mental status noted over the last day.At baseline patient is noted to be conversant and over the last 24 hours have been more lethargic and less responsive.  Patient was hospitalized from 1/7-1/12 after suffering a fall with right femur fracture undergoing ORIF by Dr. Doreatha Martin with postop complication of acute right DVT with subsequent increased of Eliquis dose.  Patient was readmitted to the hospital from 1/25-1/31 suffering thought secondary to UTI treated initially with Rocephin then sent home initially on Keflex which she completed.  Review of records shows that patient was subsequently started on Augmentin on 3/7 and then switched to Zosyn. In ED, temp of 100.6, BP low in 80s, heart rate up to 105, respirations 18-24, lactic acid 1.34, influenza negative chest x-ray clear.  Patient was admitted for sepsis with unclear etiology   Assessment & Plan    Principal Problem:   Sepsis ([place]) -Unclear etiology, patient met sepsis criteria at the time of admission with fever of 100.6, hypotension, tachycardia however source unclear.  UA negative, chest x-ray showed no consolidation or infiltrates.  Influenza panel negative - Called SNF again today am, apparently patient was receiving  IV Zosyn every 8 hours via central midline for UTI.  Last dose was today.  UA and culture negative this hospitalization. - Unfortunately blood cultures were not collected at the time of admission, ordered with the catheter tip culture. -will remove the central line   Active Problems: Acute metabolic encephalopathy -Much more alert and awake today, oriented to self and place.  Likely due to #1 - CT head negative for any acute Intracranial pathology  Essential hypertension -BP now improving, continue metoprolol   Polymyalgia rheumatica ([place]) -Per previous DC summary from 03/2017, patient was on home dose of prednisone 3 mg daily, then was on prednisone taper.  Unclear if patient is still on prednisone maintenance dose.,  Will have pharmacy verify prednisone and why patient has PICC.  -ESR, CRP elevated, restart prednisone, for now place on prednisone 20 mg daily.  Received Decadron 10 mg IV x1 in ED -Continue prednisone 20 mg daily and will taper by 5 mg every week to maintenance dose of 3 mg daily.   Chronic macrocytic anemia -B12 is 929 and normal limits, folate in process     AF (paroxysmal atrial fibrillation) ([place]) -  Currently rate controlled, continue metoprolol -Continue Eliquis    Type 2 diabetes mellitus with renal manifestations ([facility]) -CBGs uncontrolled likely due to steroids, hold Amaryl, tradjenta -Placed on sliding scale insulin, NovoLog 4 units TID AC, hemoglobin A1c in am  History of chronic diastolic CHF Not in any fluid overload, continue beta-blocker No recent echocardiogram, 2D echo in 2012 with EF of 65 to 70% with grade 1 diastolic dysfunction  Chronic thrombocytopenia -Improving, monitor counts   Code Status: DNR  DVT Prophylaxis: apixaban Family Communication: No family member at the bedside   Disposition Plan: Back to skilled nursing facility when medically stable  Time Spent in minutes  25 minutes  Procedures:  None  Consultants:    None  Antimicrobials:   IV vancomycin 3/15  IV Zosyn 3/15   Medications  Scheduled Meds: . apixaban  5 mg Oral BID  . collagenase  1 application Topical Daily  . famotidine  20 mg Oral Daily  . feeding supplement (GLUCERNA SHAKE)  237 mL Oral BID BM  . ferrous sulfate  325 mg Oral BID WC  . gabapentin  300 mg Oral Daily  . gabapentin  600 mg Oral QHS  . insulin aspart  0-12 Units Subcutaneous TID AC  . metoprolol tartrate  12.5 mg Oral BID  . mirtazapine  15 mg Oral QHS  . predniSONE  20 mg Oral Q breakfast  . sodium chloride flush  10-40 mL Intracatheter Q12H   Continuous Infusions: . sodium chloride 75 mL/hr at 05/21/17 0959  . piperacillin-tazobactam (ZOSYN)  IV 3.375 g (05/21/17 0952)  . vancomycin Stopped (05/20/17 2230)   PRN Meds:.acetaminophen **OR** acetaminophen, ipratropium-albuterol, ondansetron **OR** ondansetron (ZOFRAN) IV, oxybutynin, senna, sodium chloride flush   Antibiotics   Anti-infectives (From admission, onward)   Start     Dose/Rate Route Frequency Ordered Stop   05/20/17 2100  vancomycin (VANCOCIN) IVPB 1000 mg/200 mL premix     1,000 mg 200 mL/hr over 60 Minutes Intravenous Every 24 hours 05/19/17 2148     05/20/17 1000  piperacillin-tazobactam (ZOSYN) IVPB 3.375 g     3.375 g 12.5 mL/hr over 240 Minutes Intravenous Every 8 hours 05/20/17 0957     05/20/17 0600  piperacillin-tazobactam (ZOSYN) IVPB 3.375 g  Status:  Discontinued     3.375 g 12.5 mL/hr over 240 Minutes Intravenous Every 8 hours 05/19/17 2147 05/20/17 0957   05/19/17 2200  piperacillin-tazobactam (ZOSYN) injection 3.375 g  Status:  Discontinued     3.375 g Intramuscular Every 8 hours 05/19/17 2134 05/19/17 2142   05/19/17 2145  piperacillin-tazobactam (ZOSYN) IVPB 3.375 g     3.375 g 100 mL/hr over 30 Minutes Intravenous  Once 05/19/17 2143 05/20/17 0130   05/19/17 1945  vancomycin (VANCOCIN) IVPB 1000 mg/200 mL premix     1,000 mg 200 mL/hr over 60 Minutes Intravenous   Once 05/19/17 1935 05/19/17 2114        Subjective:   [name] was seen and examined today.  Much more alert and awake today, oriented to self and place.  Denies any pain.  No chest pain, shortness of breath, abdominal pain nausea vomiting.  No fevers.     Objective:   Vitals:   05/20/17 2109 05/21/17 0140 05/21/17 0655 05/21/17 0941  BP: 127/76  115/78 120/71  Pulse: 100  92 90  Resp: '18  18 18  ' Temp: (!) 97.4 F (36.3 C)   97.6 F (36.4 C)  TempSrc: Axillary   Oral  SpO2: 98%  100% 100%  Weight:  74.4 kg (164 lb 0.4 oz)    Height:        Intake/Output Summary (Last 24 hours) at 05/21/2017 1135 Last data filed at 05/21/2017 0900 Gross per 24 hour  Intake 1210 ml  Output 1000 ml  Net 210 ml     Wt Readings from Last 3 Encounters:  05/21/17 74.4 kg (164 lb 0.4 oz)  04/05/17 74.7 kg (164 lb 10.9 oz)  03/15/17 65.3 kg (144 lb)     Exam   General: much more alert and awake today, oriented to self and place, NAD    Eyes:  HEENT:  Atraumatic, normocephalic   Cardiovascular: S1 S2 auscultated, Regular rate and rhythm. No pedal edema b/l  Respiratory: Clear to auscultation bilaterally, no wheezing, rales or rhonchi  Gastrointestinal: Soft, nontender, nondistended, + bowel sounds  Ext: no pedal edema bilaterally  Neuro: no new deficits, strength 5/5 upper and lower extremity b/l, hands in mittens  Musculoskeletal: No digital cyanosis, clubbing  Skin: No rashes  Psych: Normal affect and demeanor, alert and oriented x2   Data Reviewed:  I have personally reviewed following labs and imaging studies  Micro Results Recent Results (from the past 240 hour(s))  Urine culture     Status: Abnormal   Collection Time: 05/19/17 12:10 PM  Result Value Ref Range Status   Specimen Description URINE, RANDOM  Final   Special Requests NONE  Final   Culture (A)  Final    <10,000 COLONIES/mL INSIGNIFICANT GROWTH Performed at [facility], [address] [place], [place] [address]    Report Status 05/20/2017 FINAL  Final  MRSA PCR Screening     Status: None   Collection Time: 05/20/17  2:12 AM  Result Value Ref Range Status   MRSA by PCR NEGATIVE NEGATIVE Final    Comment:        The GeneXpert MRSA Assay (FDA approved for NASAL specimens only), is one component of a comprehensive MRSA colonization surveillance program. It is not intended to diagnose MRSA infection nor to guide or monitor treatment for MRSA infections. Performed at [facility], [address], [place], [place] [address]     Radiology Reports Ct Head Wo Contrast  Result Date: 05/19/2017 CLINICAL DATA:  Increased lethargy EXAM: CT HEAD WITHOUT CONTRAST TECHNIQUE: Contiguous axial images were obtained from the base of the skull through the vertex without intravenous contrast. COMPARISON:  03/31/2017 FINDINGS: Brain: Mild atrophic and chronic white matter ischemic changes are noted. No findings to suggest acute hemorrhage, acute infarction or space-occupying mass lesion are noted. Vascular: No hyperdense vessel or unexpected calcification. Skull: Normal. Negative for fracture or focal lesion. Sinuses/Orbits: Opacification of the left maxillary antrum is noted stable from the prior exam. Air-fluid level is noted within the sphenoid sinus on the right. Fluid is noted within the mastoid air cells on the left. Other: None. IMPRESSION: Chronic changes in the left maxillary antrum. Fluid within the right sphenoid sinus and left mastoid air cells. Atrophic and ischemic changes without acute abnormality. Electronically Signed   By: Inez Catalina M.D.   On: 05/19/2017 14:54   Dg Chest Port 1 View  Result Date: 05/19/2017 CLINICAL DATA:  Loss of consciousness. EXAM: PORTABLE CHEST 1 VIEW COMPARISON:  Chest x-ray dated March 31, 2017. FINDINGS: Stable borderline cardiomegaly. Normal pulmonary vascularity. Unchanged scarring/atelectasis at the left lung base. No focal  consolidation, pleural effusion, or pneumothorax. No acute osseous abnormality. IMPRESSION: No active  disease. Electronically Signed   By: Titus Dubin M.D.   On: 05/19/2017 12:18    Lab Data:  CBC: Recent Labs  Lab 05/19/17 1225 05/20/17 0413 05/21/17 0438  WBC 9.5 5.9 8.7  NEUTROABS 5.7  --   --   HGB 11.4* 11.9* 10.3*  HCT 36.2 37.8 32.1*  MCV 102.0* 103.0* 101.3*  PLT 125* 120* 824*   Basic Metabolic Panel: Recent Labs  Lab 05/19/17 1225 05/20/17 0413 05/21/17 0438  NA 135 139 139  K 3.9 4.7 4.5  CL 101 105 109  CO2 24 20* 23  GLUCOSE 101* 175* 304*  BUN 13 14 22*  CREATININE 0.90 0.94 0.92  CALCIUM 8.6* 8.7* 8.5*   GFR: Estimated Creatinine Clearance: 37.5 mL/min (by C-G formula based on SCr of 0.92 mg/dL). Liver Function Tests: Recent Labs  Lab 05/19/17 1225  AST 32  ALT 26  ALKPHOS 85  BILITOT 1.0  PROT 5.6*  ALBUMIN 2.4*   No results for input(s): LIPASE, AMYLASE in the last 168 hours. No results for input(s): AMMONIA in the last 168 hours. Coagulation Profile: Recent Labs  Lab 05/19/17 1225  INR 1.92   Cardiac Enzymes: No results for input(s): CKTOTAL, CKMB, CKMBINDEX, TROPONINI in the last 168 hours. BNP (last 3 results) No results for input(s): PROBNP in the last 8760 hours. HbA1C: No results for input(s): HGBA1C in the last 72 hours. CBG: Recent Labs  Lab 05/19/17 1238 05/20/17 1139 05/20/17 1645 05/20/17 2107 05/21/17 0721  GLUCAP 102* 272* 281* 246* 261*   Lipid Profile: No results for input(s): CHOL, HDL, LDLCALC, TRIG, CHOLHDL, LDLDIRECT in the last 72 hours. Thyroid Function Tests: No results for input(s): TSH, T4TOTAL, FREET4, T3FREE, THYROIDAB in the last 72 hours. Anemia Panel: Recent Labs    05/20/17 0413  VITAMINB12 929*   Urine analysis:    Component Value Date/Time   COLORURINE YELLOW 05/19/2017 1205   APPEARANCEUR HAZY (A) 05/19/2017 1205   APPEARANCEUR Clear 05/07/2013 1436   LABSPEC 1.025 05/19/2017  1205   PHURINE 5.0 05/19/2017 1205   GLUCOSEU NEGATIVE 05/19/2017 1205   HGBUR MODERATE (A) 05/19/2017 1205   BILIRUBINUR NEGATIVE 05/19/2017 1205   BILIRUBINUR Neg 12/11/2013 1345   BILIRUBINUR Negative 05/07/2013 1436   KETONESUR 5 (A) 05/19/2017 1205   PROTEINUR NEGATIVE 05/19/2017 1205   UROBILINOGEN 0.2 12/11/2013 1345   UROBILINOGEN 1.0 11/16/2013 1256   NITRITE NEGATIVE 05/19/2017 1205   LEUKOCYTESUR TRACE (A) 05/19/2017 1205   LEUKOCYTESUR Negative 05/07/2013 1436     Ripudeep Rai M.D. [facility] 05/21/2017, 11:35 AM  Pager: [phone] Between 7am to 7pm - call Pager - [phone]  After 7pm go to [email or web address] - password [email or web address]  Call night coverage person covering after 7pm

## 2017-05-22 LAB — CBC
HEMATOCRIT: 39.5 % (ref 36.0–46.0)
HEMOGLOBIN: 12.3 g/dL (ref 12.0–15.0)
MCH: 31.9 pg (ref 26.0–34.0)
MCHC: 31.1 g/dL (ref 30.0–36.0)
MCV: 102.6 fL — ABNORMAL HIGH (ref 78.0–100.0)
Platelets: 138 10*3/uL — ABNORMAL LOW (ref 150–400)
RBC: 3.85 MIL/uL — AB (ref 3.87–5.11)
RDW: 16 % — ABNORMAL HIGH (ref 11.5–15.5)
WBC: 10.1 10*3/uL (ref 4.0–10.5)

## 2017-05-22 LAB — GLUCOSE, CAPILLARY
GLUCOSE-CAPILLARY: 190 mg/dL — AB (ref 65–99)
GLUCOSE-CAPILLARY: 135 mg/dL — AB (ref 65–99)
Glucose-Capillary: 201 mg/dL — ABNORMAL HIGH (ref 65–99)
Glucose-Capillary: 149 mg/dL — ABNORMAL HIGH (ref 65–99)

## 2017-05-22 LAB — FOLATE RBC
FOLATE, HEMOLYSATE: 486.8 ng/mL
Folate, RBC: 1379 ng/mL (ref >498)
Hematocrit: 35.3 % (ref 34.0–46.6)

## 2017-05-22 LAB — BASIC METABOLIC PANEL
ANION GAP: 12 (ref 5–15)
BUN: 25 mg/dL — ABNORMAL HIGH (ref 6–20)
CALCIUM: 8.7 mg/dL — AB (ref 8.9–10.3)
CHLORIDE: 110 mmol/L (ref 101–111)
CO2: 18 mmol/L — AB (ref 22–32)
Creatinine, Ser: 0.76 mg/dL (ref 0.44–1.00)
GFR calc Af Amer: >60 mL/min (ref >60)
GFR calc non Af Amer: >60 mL/min (ref >60)
Glucose, Bld: 216 mg/dL — ABNORMAL HIGH (ref 65–99)
POTASSIUM: 5.3 mmol/L — AB (ref 3.5–5.1)
Sodium: 140 mmol/L (ref 135–145)

## 2017-05-22 MED ORDER — INSULIN ASPART 100 UNIT/ML ~~LOC~~ SOLN
6.0000 [IU] | Freq: Three times a day (TID) | SUBCUTANEOUS | Status: DC
  Administered 2017-05-22 – 2017-05-26 (×5): 6 [IU] via SUBCUTANEOUS

## 2017-05-22 MED ORDER — SODIUM POLYSTYRENE SULFONATE 15 GM/60ML PO SUSP
30.0000 g | Freq: Once | ORAL | Status: AC
  Administered 2017-05-22: 30 g via ORAL
  Filled 2017-05-22: qty 120

## 2017-05-22 MED ORDER — GLIMEPIRIDE 2 MG PO TABS
2.0000 mg | ORAL_TABLET | Freq: Every day | ORAL | Status: DC
  Administered 2017-05-23 – 2017-05-26 (×4): 2 mg via ORAL
  Filled 2017-05-22 (×5): qty 1

## 2017-05-22 NOTE — Progress Notes (Addendum)
[facility]                                                                              Patient Demographics  [name], is a 84 y.o. female, DOB - 11-Nov-1924, WJX:[id number]  Admit date - 05/19/2017   Admitting Physician Norval Morton, MD  Outpatient Primary MD for the patient is Patient, No Pcp Per  Outpatient specialists:   LOS - 3  days   Medical records reviewed and are as summarized below:    Chief Complaint  Patient presents with  . Altered Mental Status       Brief summary   Patient is a 84 year old female with  HTN, A. fib, chronic diastolic CHF, DM type II with neuropathy, COPD, temporal arteritis, PMR, CKD, ITP, DVT on Eliquis, and osteoporosis; who presented for change in mental status noted over the last day.At baseline patient is noted to be conversant and over the last 24 hours have been more lethargic and less responsive.  Patient was hospitalized from 1/7-1/12 after suffering a fall with right femur fracture undergoing ORIF by Dr. Doreatha Martin with postop complication of acute right DVT with subsequent increased of Eliquis dose.  Patient was readmitted to the hospital from 1/25-1/31 suffering thought secondary to UTI treated initially with Rocephin then sent home initially on Keflex which she completed.  Review of records shows that patient was subsequently started on Augmentin on 3/7 and then switched to Zosyn. In ED, temp of 100.6, BP low in 80s, heart rate up to 105, respirations 18-24, lactic acid 1.34, influenza negative chest x-ray clear.  Patient was admitted for sepsis with unclear etiology   Assessment & Plan    Principal Problem:   Sepsis ([place]) -Unclear etiology, patient met sepsis criteria at the time of admission with fever of 100.6, hypotension, tachycardia however source unclear.  UA negative, chest x-ray showed no consolidation or infiltrates.  Influenza panel negative - central line removed, follow blood cultures, catheter tip  cultures  Continue IV antibiotics, follow cultures, will transition to oral antibiotics  - sepsis may or may not related to central line, clinically undetermined, will follow cultures  Active Problems: Acute metabolic encephalopathy -Appears to be close to her baseline, alert and awake,  Likely due to #1 - CT head negative for any acute Intracranial pathology  Essential hypertension -BP now improving, continue metoprolol   Polymyalgia rheumatica ([place]) -Per previous DC summary from 03/2017, patient was on home dose of prednisone 3 mg daily, then was on prednisone taper.  Unclear if patient is still on prednisone maintenance dose.,  Will have pharmacy verify prednisone and why patient has PICC.  -ESR, CRP elevated, restart prednisone, for now place on prednisone 20 mg daily.  Received Decadron 10 mg IV x1 in ED -Continue prednisone 20 mg daily and will taper by 5 mg every week to maintenance dose of 3 mg daily.   Chronic macrocytic anemia -B12 is 929 and normal limits, folate in process     AF (paroxysmal atrial fibrillation) (HCC) -Currently rate controlled, continue metoprolol -Continue Eliquis    Type 2 diabetes mellitus with renal manifestations (HCC) -CBGs uncontrolled  likely due to steroids, hold tradjenta -Placed on sliding scale insulin, increased NovoLog to 6 units TID AC -Hemoglobin A1c 5.1, will restart Amaryl  History of chronic diastolic CHF Not in any fluid overload, continue beta-blocker No recent echocardiogram, 2D echo in 2012 with EF of 65 to 70% with grade 1 diastolic dysfunction  Chronic thrombocytopenia -Improving, monitor counts   Code Status: DNR  DVT Prophylaxis: apixaban Family Communication: No family member at the bedside   Disposition Plan: Possible DC in a.m. pending culture results  Time Spent in minutes  25 minutes  Procedures:  None  Consultants:   None  Antimicrobials:   IV vancomycin 3/15  IV Zosyn  3/15   Medications  Scheduled Meds: . apixaban  5 mg Oral BID  . collagenase  1 application Topical Daily  . famotidine  20 mg Oral Daily  . feeding supplement (GLUCERNA SHAKE)  237 mL Oral BID BM  . ferrous sulfate  325 mg Oral BID WC  . gabapentin  300 mg Oral Daily  . gabapentin  600 mg Oral QHS  . insulin aspart  0-15 Units Subcutaneous TID WC  . insulin aspart  0-5 Units Subcutaneous QHS  . insulin aspart  4 Units Subcutaneous TID WC  . metoprolol tartrate  12.5 mg Oral BID  . mirtazapine  15 mg Oral QHS  . predniSONE  20 mg Oral Q breakfast  . sodium chloride flush  10-40 mL Intracatheter Q12H   Continuous Infusions: . sodium chloride 75 mL/hr at 05/22/17 1212  . piperacillin-tazobactam (ZOSYN)  IV 3.375 g (05/22/17 1040)  . vancomycin Stopped (05/22/17 0001)   PRN Meds:.acetaminophen **OR** acetaminophen, ipratropium-albuterol, ondansetron **OR** ondansetron (ZOFRAN) IV, oxybutynin, senna, sodium chloride flush   Antibiotics   Anti-infectives (From admission, onward)   Start     Dose/Rate Route Frequency Ordered Stop   05/20/17 2100  vancomycin (VANCOCIN) IVPB 1000 mg/200 mL premix     1,000 mg 200 mL/hr over 60 Minutes Intravenous Every 24 hours 05/19/17 2148     05/20/17 1000  piperacillin-tazobactam (ZOSYN) IVPB 3.375 g     3.375 g 12.5 mL/hr over 240 Minutes Intravenous Every 8 hours 05/20/17 0957     05/20/17 0600  piperacillin-tazobactam (ZOSYN) IVPB 3.375 g  Status:  Discontinued     3.375 g 12.5 mL/hr over 240 Minutes Intravenous Every 8 hours 05/19/17 2147 05/20/17 0957   05/19/17 2200  piperacillin-tazobactam (ZOSYN) injection 3.375 g  Status:  Discontinued     3.375 g Intramuscular Every 8 hours 05/19/17 2134 05/19/17 2142   05/19/17 2145  piperacillin-tazobactam (ZOSYN) IVPB 3.375 g     3.375 g 100 mL/hr over 30 Minutes Intravenous  Once 05/19/17 2143 05/20/17 0130   05/19/17 1945  vancomycin (VANCOCIN) IVPB 1000 mg/200 mL premix     1,000 mg 200  mL/hr over 60 Minutes Intravenous  Once 05/19/17 1935 05/19/17 2114        Subjective:   [name] was seen and examined today.  Alert and awake, oriented, appears to be close to her baseline.  States has some chronic pain issues in her legs.   No chest pain, shortness of breath, abdominal pain nausea vomiting.  No fevers.     Objective:   Vitals:   05/21/17 2041 05/22/17 0256 05/22/17 0427 05/22/17 0837  BP: 121/62  100/72 105/70  Pulse: 64  93 78  Resp: '16  14 16  ' Temp: 97.7 F (36.5 C)   98 F (36.7 C)  TempSrc:  Axillary   Oral  SpO2: 99%  100% 100%  Weight: 74.9 kg (165 lb 2 oz) 74.9 kg (165 lb 2 oz)    Height:        Intake/Output Summary (Last 24 hours) at 05/22/2017 1237 Last data filed at 05/22/2017 2536 Gross per 24 hour  Intake 1650 ml  Output 1200 ml  Net 450 ml     Wt Readings from Last 3 Encounters:  05/22/17 74.9 kg (165 lb 2 oz)  04/05/17 74.7 kg (164 lb 10.9 oz)  03/15/17 65.3 kg (144 lb)     Exam   General: Alert and oriented x2  Eyes:   HEENT:    Cardiovascular: S1 S2 auscultated, Regular rate and rhythm. No pedal edema b/l  Respiratory: CTA B  Gastrointestinal: Soft, nontender, nondistended, + bowel sounds  Ext: no pedal edema bilaterally  Neuro: no new deficits  Musculoskeletal: No digital cyanosis, clubbing  Skin: No rashes  Psych: much more alert and awake, appears to be close to her baseline   Data Reviewed:  I have personally reviewed following labs and imaging studies  Micro Results Recent Results (from the past 240 hour(s))  Urine culture     Status: Abnormal   Collection Time: 05/19/17 12:10 PM  Result Value Ref Range Status   Specimen Description URINE, RANDOM  Final   Special Requests NONE  Final   Culture (A)  Final    <10,000 COLONIES/mL INSIGNIFICANT GROWTH Performed at [facility], [address], [place], [place] [address]    Report Status 05/20/2017 FINAL  Final  MRSA PCR Screening      Status: None   Collection Time: 05/20/17  2:12 AM  Result Value Ref Range Status   MRSA by PCR NEGATIVE NEGATIVE Final    Comment:        The GeneXpert MRSA Assay (FDA approved for NASAL specimens only), is one component of a comprehensive MRSA colonization surveillance program. It is not intended to diagnose MRSA infection nor to guide or monitor treatment for MRSA infections. Performed at [facility], [place] [address] [place], [place] [address]   Cath Tip Culture     Status: None (Preliminary result)   Collection Time: 05/21/17  9:32 AM  Result Value Ref Range Status   Specimen Description ARM LEFT URINE, CATHETERIZED CENTRAL LINE  Final   Special Requests NONE  Final   Culture   Final    CULTURE REINCUBATED FOR BETTER GROWTH Performed at [facility], [place] [address] [place], [place] [address]    Report Status PENDING  Incomplete    Radiology Reports Ct Head Wo Contrast  Result Date: 05/19/2017 CLINICAL DATA:  Increased lethargy EXAM: CT HEAD WITHOUT CONTRAST TECHNIQUE: Contiguous axial images were obtained from the base of the skull through the vertex without intravenous contrast. COMPARISON:  03/31/2017 FINDINGS: Brain: Mild atrophic and chronic white matter ischemic changes are noted. No findings to suggest acute hemorrhage, acute infarction or space-occupying mass lesion are noted. Vascular: No hyperdense vessel or unexpected calcification. Skull: Normal. Negative for fracture or focal lesion. Sinuses/Orbits: Opacification of the left maxillary antrum is noted stable from the prior exam. Air-fluid level is noted within the sphenoid sinus on the right. Fluid is noted within the mastoid air cells on the left. Other: None. IMPRESSION: Chronic changes in the left maxillary antrum. Fluid within the right sphenoid sinus and left mastoid air cells. Atrophic and ischemic changes without acute abnormality. Electronically Signed  By: Inez Catalina M.D.   On: 05/19/2017 14:54    Dg Chest Port 1 View  Result Date: 05/19/2017 CLINICAL DATA:  Loss of consciousness. EXAM: PORTABLE CHEST 1 VIEW COMPARISON:  Chest x-ray dated March 31, 2017. FINDINGS: Stable borderline cardiomegaly. Normal pulmonary vascularity. Unchanged scarring/atelectasis at the left lung base. No focal consolidation, pleural effusion, or pneumothorax. No acute osseous abnormality. IMPRESSION: No active disease. Electronically Signed   By: Titus Dubin M.D.   On: 05/19/2017 12:18    Lab Data:  CBC: Recent Labs  Lab 05/19/17 1225 05/20/17 0413 05/21/17 0438 05/22/17 1059  WBC 9.5 5.9 8.7 10.1  NEUTROABS 5.7  --   --   --   HGB 11.4* 11.9* 10.3* 12.3  HCT 36.2 37.8 32.1* 39.5  MCV 102.0* 103.0* 101.3* 102.6*  PLT 125* 120* 136* 562*   Basic Metabolic Panel: Recent Labs  Lab 05/19/17 1225 05/20/17 0413 05/21/17 0438 05/22/17 1059  NA 135 139 139 140  K 3.9 4.7 4.5 5.3*  CL 101 105 109 110  CO2 24 20* 23 18*  GLUCOSE 101* 175* 304* 216*  BUN 13 14 22* 25*  CREATININE 0.90 0.94 0.92 0.76  CALCIUM 8.6* 8.7* 8.5* 8.7*   GFR: Estimated Creatinine Clearance: 43.2 mL/min (by C-G formula based on SCr of 0.76 mg/dL). Liver Function Tests: Recent Labs  Lab 05/19/17 1225  AST 32  ALT 26  ALKPHOS 85  BILITOT 1.0  PROT 5.6*  ALBUMIN 2.4*   No results for input(s): LIPASE, AMYLASE in the last 168 hours. No results for input(s): AMMONIA in the last 168 hours. Coagulation Profile: Recent Labs  Lab 05/19/17 1225  INR 1.92   Cardiac Enzymes: No results for input(s): CKTOTAL, CKMB, CKMBINDEX, TROPONINI in the last 168 hours. BNP (last 3 results) No results for input(s): PROBNP in the last 8760 hours. HbA1C: Recent Labs    05/21/17 0438  HGBA1C 5.1   CBG: Recent Labs  Lab 05/21/17 1132 05/21/17 1610 05/21/17 2133 05/22/17 0728 05/22/17 1123  GLUCAP 273* 207* 171* 201* 190*   Lipid Profile: No results for input(s): CHOL, HDL, LDLCALC, TRIG, CHOLHDL, LDLDIRECT in  the last 72 hours. Thyroid Function Tests: No results for input(s): TSH, T4TOTAL, FREET4, T3FREE, THYROIDAB in the last 72 hours. Anemia Panel: Recent Labs    05/20/17 0413  VITAMINB12 929*   Urine analysis:    Component Value Date/Time   COLORURINE YELLOW 05/19/2017 1205   APPEARANCEUR HAZY (A) 05/19/2017 1205   APPEARANCEUR Clear 05/07/2013 1436   LABSPEC 1.025 05/19/2017 1205   PHURINE 5.0 05/19/2017 1205   GLUCOSEU NEGATIVE 05/19/2017 1205   HGBUR MODERATE (A) 05/19/2017 1205   BILIRUBINUR NEGATIVE 05/19/2017 1205   BILIRUBINUR Neg 12/11/2013 1345   BILIRUBINUR Negative 05/07/2013 1436   KETONESUR 5 (A) 05/19/2017 1205   PROTEINUR NEGATIVE 05/19/2017 1205   UROBILINOGEN 0.2 12/11/2013 1345   UROBILINOGEN 1.0 11/16/2013 1256   NITRITE NEGATIVE 05/19/2017 1205   LEUKOCYTESUR TRACE (A) 05/19/2017 1205   LEUKOCYTESUR Negative 05/07/2013 1436     Ripudeep Rai M.D. [facility] 05/22/2017, 12:37 PM  Pager: [phone] Between 7am to 7pm - call Pager - [phone]  After 7pm go to [email or web address] - password [email or web address]  Call night coverage person covering after 7pm           [facility]  Patient Demographics  [name], is a 84 y.o. female, DOB - 04-05-1924, [id number]  Admit date - 05/19/2017   Admitting Physician Norval Morton, MD  Outpatient Primary MD for the patient is Patient, No Pcp Per  Outpatient specialists:   LOS - 3  days   Medical records reviewed and are as summarized below:    Chief Complaint  Patient presents with  . Altered Mental Status       Brief summary   Patient is a 84 year old female with  HTN, A. fib, chronic diastolic CHF, DM type II with neuropathy, COPD, temporal arteritis, PMR, CKD, ITP, DVT on Eliquis, and osteoporosis; who presented for change in mental status noted over the last day.At baseline patient is noted to be conversant and over  the last 24 hours have been more lethargic and less responsive.  Patient was hospitalized from 1/7-1/12 after suffering a fall with right femur fracture undergoing ORIF by Dr. Doreatha Martin with postop complication of acute right DVT with subsequent increased of Eliquis dose.  Patient was readmitted to the hospital from 1/25-1/31 suffering thought secondary to UTI treated initially with Rocephin then sent home initially on Keflex which she completed.  Review of records shows that patient was subsequently started on Augmentin on 3/7 and then switched to Zosyn. In ED, temp of 100.6, BP low in 80s, heart rate up to 105, respirations 18-24, lactic acid 1.34, influenza negative chest x-ray clear.  Patient was admitted for sepsis with unclear etiology   Assessment & Plan    Principal Problem:   Sepsis ([place]) -Unclear etiology, patient met sepsis criteria at the time of admission with fever of 100.6, hypotension, tachycardia however source unclear.  UA negative, chest x-ray showed no consolidation or infiltrates.  Influenza panel negative - Called SNF again today am, apparently patient was receiving IV Zosyn every 8 hours via central midline for UTI.  Last dose was today.  UA and culture negative this hospitalization. - Unfortunately blood cultures were not collected at the time of admission, ordered with the catheter tip culture. -will remove the central line   Active Problems: Acute metabolic encephalopathy -Much more alert and awake today, oriented to self and place.  Likely due to #1 - CT head negative for any acute Intracranial pathology  Essential hypertension -BP now improving, continue metoprolol   Polymyalgia rheumatica ([place]) -Per previous DC summary from 03/2017, patient was on home dose of prednisone 3 mg daily, then was on prednisone taper.  Unclear if patient is still on prednisone maintenance dose.,  Will have pharmacy verify prednisone and why patient has PICC.  -ESR, CRP elevated, restart  prednisone, for now place on prednisone 20 mg daily.  Received Decadron 10 mg IV x1 in ED -Continue prednisone 20 mg daily and will taper by 5 mg every week to maintenance dose of 3 mg daily.   Chronic macrocytic anemia -B12 is 929 and normal limits, folate in process     AF (paroxysmal atrial fibrillation) (HCC) -Currently rate controlled, continue metoprolol -Continue Eliquis    Type 2 diabetes mellitus with renal manifestations (HCC) -CBGs uncontrolled likely due to steroids, hold Amaryl, tradjenta -Placed on sliding scale insulin, NovoLog 4 units TID AC, hemoglobin A1c in am  History of chronic diastolic CHF Not in any fluid overload, continue beta-blocker No recent echocardiogram, 2D echo in 2012 with EF of 65 to 70% with grade 1 diastolic dysfunction  Chronic thrombocytopenia -Improving, monitor counts   Code Status: DNR  DVT Prophylaxis: apixaban  Family Communication: No family member at the bedside   Disposition Plan: Back to skilled nursing facility when medically stable  Time Spent in minutes  25 minutes  Procedures:  None  Consultants:   None  Antimicrobials:   IV vancomycin 3/15  IV Zosyn 3/15   Medications  Scheduled Meds: . apixaban  5 mg Oral BID  . collagenase  1 application Topical Daily  . famotidine  20 mg Oral Daily  . feeding supplement (GLUCERNA SHAKE)  237 mL Oral BID BM  . ferrous sulfate  325 mg Oral BID WC  . gabapentin  300 mg Oral Daily  . gabapentin  600 mg Oral QHS  . insulin aspart  0-15 Units Subcutaneous TID WC  . insulin aspart  0-5 Units Subcutaneous QHS  . insulin aspart  4 Units Subcutaneous TID WC  . metoprolol tartrate  12.5 mg Oral BID  . mirtazapine  15 mg Oral QHS  . predniSONE  20 mg Oral Q breakfast  . sodium chloride flush  10-40 mL Intracatheter Q12H  . sodium polystyrene  30 g Oral Once   Continuous Infusions: . sodium chloride 75 mL/hr at 05/22/17 1212  . piperacillin-tazobactam (ZOSYN)  IV 3.375 g  (05/22/17 1040)  . vancomycin Stopped (05/22/17 0001)   PRN Meds:.acetaminophen **OR** acetaminophen, ipratropium-albuterol, ondansetron **OR** ondansetron (ZOFRAN) IV, oxybutynin, senna, sodium chloride flush   Antibiotics   Anti-infectives (From admission, onward)   Start     Dose/Rate Route Frequency Ordered Stop   05/20/17 2100  vancomycin (VANCOCIN) IVPB 1000 mg/200 mL premix     1,000 mg 200 mL/hr over 60 Minutes Intravenous Every 24 hours 05/19/17 2148     05/20/17 1000  piperacillin-tazobactam (ZOSYN) IVPB 3.375 g     3.375 g 12.5 mL/hr over 240 Minutes Intravenous Every 8 hours 05/20/17 0957     05/20/17 0600  piperacillin-tazobactam (ZOSYN) IVPB 3.375 g  Status:  Discontinued     3.375 g 12.5 mL/hr over 240 Minutes Intravenous Every 8 hours 05/19/17 2147 05/20/17 0957   05/19/17 2200  piperacillin-tazobactam (ZOSYN) injection 3.375 g  Status:  Discontinued     3.375 g Intramuscular Every 8 hours 05/19/17 2134 05/19/17 2142   05/19/17 2145  piperacillin-tazobactam (ZOSYN) IVPB 3.375 g     3.375 g 100 mL/hr over 30 Minutes Intravenous  Once 05/19/17 2143 05/20/17 0130   05/19/17 1945  vancomycin (VANCOCIN) IVPB 1000 mg/200 mL premix     1,000 mg 200 mL/hr over 60 Minutes Intravenous  Once 05/19/17 1935 05/19/17 2114        Subjective:   [name] was seen and examined today.  Much more alert and awake today, oriented to self and place.  Denies any pain.  No chest pain, shortness of breath, abdominal pain nausea vomiting.  No fevers.     Objective:   Vitals:   05/21/17 2041 05/22/17 0256 05/22/17 0427 05/22/17 0837  BP: 121/62  100/72 105/70  Pulse: 64  93 78  Resp: '16  14 16  ' Temp: 97.7 F (36.5 C)   98 F (36.7 C)  TempSrc: Axillary   Oral  SpO2: 99%  100% 100%  Weight: 74.9 kg (165 lb 2 oz) 74.9 kg (165 lb 2 oz)    Height:        Intake/Output Summary (Last 24 hours) at 05/22/2017 1238 Last data filed at 05/22/2017 0838 Gross per 24 hour  Intake  1650 ml  Output 1200 ml  Net 450 ml  Wt Readings from Last 3 Encounters:  05/22/17 74.9 kg (165 lb 2 oz)  04/05/17 74.7 kg (164 lb 10.9 oz)  03/15/17 65.3 kg (144 lb)     Exam   General: much more alert and awake today, oriented to self and place, NAD    Eyes:  HEENT:  Atraumatic, normocephalic   Cardiovascular: S1 S2 auscultated, Regular rate and rhythm. No pedal edema b/l  Respiratory: Clear to auscultation bilaterally, no wheezing, rales or rhonchi  Gastrointestinal: Soft, nontender, nondistended, + bowel sounds  Ext: no pedal edema bilaterally  Neuro: no new deficits, strength 5/5 upper and lower extremity b/l, hands in mittens  Musculoskeletal: No digital cyanosis, clubbing  Skin: No rashes  Psych: Normal affect and demeanor, alert and oriented x2   Data Reviewed:  I have personally reviewed following labs and imaging studies  Micro Results Recent Results (from the past 240 hour(s))  Urine culture     Status: Abnormal   Collection Time: 05/19/17 12:10 PM  Result Value Ref Range Status   Specimen Description URINE, RANDOM  Final   Special Requests NONE  Final   Culture (A)  Final    <10,000 COLONIES/mL INSIGNIFICANT GROWTH Performed at [facility], [address], [place], [place] [address]    Report Status 05/20/2017 FINAL  Final  MRSA PCR Screening     Status: None   Collection Time: 05/20/17  2:12 AM  Result Value Ref Range Status   MRSA by PCR NEGATIVE NEGATIVE Final    Comment:        The GeneXpert MRSA Assay (FDA approved for NASAL specimens only), is one component of a comprehensive MRSA colonization surveillance program. It is not intended to diagnose MRSA infection nor to guide or monitor treatment for MRSA infections. Performed at [facility], [place] [address] [place], [place] [address]   Cath Tip Culture     Status: None (Preliminary result)   Collection Time: 05/21/17  9:32 AM  Result Value Ref Range Status    Specimen Description ARM LEFT URINE, CATHETERIZED CENTRAL LINE  Final   Special Requests NONE  Final   Culture   Final    CULTURE REINCUBATED FOR BETTER GROWTH Performed at [facility], [place] [address] [place], [place] [address]    Report Status PENDING  Incomplete    Radiology Reports Ct Head Wo Contrast  Result Date: 05/19/2017 CLINICAL DATA:  Increased lethargy EXAM: CT HEAD WITHOUT CONTRAST TECHNIQUE: Contiguous axial images were obtained from the base of the skull through the vertex without intravenous contrast. COMPARISON:  03/31/2017 FINDINGS: Brain: Mild atrophic and chronic white matter ischemic changes are noted. No findings to suggest acute hemorrhage, acute infarction or space-occupying mass lesion are noted. Vascular: No hyperdense vessel or unexpected calcification. Skull: Normal. Negative for fracture or focal lesion. Sinuses/Orbits: Opacification of the left maxillary antrum is noted stable from the prior exam. Air-fluid level is noted within the sphenoid sinus on the right. Fluid is noted within the mastoid air cells on the left. Other: None. IMPRESSION: Chronic changes in the left maxillary antrum. Fluid within the right sphenoid sinus and left mastoid air cells. Atrophic and ischemic changes without acute abnormality. Electronically Signed   By: Inez Catalina M.D.   On: 05/19/2017 14:54   Dg Chest Port 1 View  Result Date: 05/19/2017 CLINICAL DATA:  Loss of consciousness. EXAM: PORTABLE CHEST 1 VIEW COMPARISON:  Chest x-ray dated March 31, 2017. FINDINGS: Stable borderline cardiomegaly. Normal  pulmonary vascularity. Unchanged scarring/atelectasis at the left lung base. No focal consolidation, pleural effusion, or pneumothorax. No acute osseous abnormality. IMPRESSION: No active disease. Electronically Signed   By: Titus Dubin M.D.   On: 05/19/2017 12:18    Lab Data:  CBC: Recent Labs  Lab 05/19/17 1225 05/20/17 0413 05/21/17 0438 05/22/17 1059  WBC 9.5 5.9  8.7 10.1  NEUTROABS 5.7  --   --   --   HGB 11.4* 11.9* 10.3* 12.3  HCT 36.2 37.8 32.1* 39.5  MCV 102.0* 103.0* 101.3* 102.6*  PLT 125* 120* 136* 978*   Basic Metabolic Panel: Recent Labs  Lab 05/19/17 1225 05/20/17 0413 05/21/17 0438 05/22/17 1059  NA 135 139 139 140  K 3.9 4.7 4.5 5.3*  CL 101 105 109 110  CO2 24 20* 23 18*  GLUCOSE 101* 175* 304* 216*  BUN 13 14 22* 25*  CREATININE 0.90 0.94 0.92 0.76  CALCIUM 8.6* 8.7* 8.5* 8.7*   GFR: Estimated Creatinine Clearance: 43.2 mL/min (by C-G formula based on SCr of 0.76 mg/dL). Liver Function Tests: Recent Labs  Lab 05/19/17 1225  AST 32  ALT 26  ALKPHOS 85  BILITOT 1.0  PROT 5.6*  ALBUMIN 2.4*   No results for input(s): LIPASE, AMYLASE in the last 168 hours. No results for input(s): AMMONIA in the last 168 hours. Coagulation Profile: Recent Labs  Lab 05/19/17 1225  INR 1.92   Cardiac Enzymes: No results for input(s): CKTOTAL, CKMB, CKMBINDEX, TROPONINI in the last 168 hours. BNP (last 3 results) No results for input(s): PROBNP in the last 8760 hours. HbA1C: Recent Labs    05/21/17 0438  HGBA1C 5.1   CBG: Recent Labs  Lab 05/21/17 1132 05/21/17 1610 05/21/17 2133 05/22/17 0728 05/22/17 1123  GLUCAP 273* 207* 171* 201* 190*   Lipid Profile: No results for input(s): CHOL, HDL, LDLCALC, TRIG, CHOLHDL, LDLDIRECT in the last 72 hours. Thyroid Function Tests: No results for input(s): TSH, T4TOTAL, FREET4, T3FREE, THYROIDAB in the last 72 hours. Anemia Panel: Recent Labs    05/20/17 0413  VITAMINB12 929*   Urine analysis:    Component Value Date/Time   COLORURINE YELLOW 05/19/2017 1205   APPEARANCEUR HAZY (A) 05/19/2017 1205   APPEARANCEUR Clear 05/07/2013 1436   LABSPEC 1.025 05/19/2017 1205   PHURINE 5.0 05/19/2017 1205   GLUCOSEU NEGATIVE 05/19/2017 1205   HGBUR MODERATE (A) 05/19/2017 1205   BILIRUBINUR NEGATIVE 05/19/2017 1205   BILIRUBINUR Neg 12/11/2013 1345   BILIRUBINUR Negative  05/07/2013 1436   KETONESUR 5 (A) 05/19/2017 1205   PROTEINUR NEGATIVE 05/19/2017 1205   UROBILINOGEN 0.2 12/11/2013 1345   UROBILINOGEN 1.0 11/16/2013 1256   NITRITE NEGATIVE 05/19/2017 1205   LEUKOCYTESUR TRACE (A) 05/19/2017 1205   LEUKOCYTESUR Negative 05/07/2013 1436     Ripudeep Rai M.D. [facility] 05/22/2017, 12:38 PM  Pager: [phone] Between 7am to 7pm - call Pager - [phone]  After 7pm go to [email or web address] - password [email or web address]  Call night coverage person covering after 7pm

## 2017-05-22 NOTE — Progress Notes (Signed)
Inpatient Diabetes Program Recommendations  AACE/ADA: New Consensus Statement on Inpatient Glycemic Control (2015)  Target Ranges:  Prepandial:   less than 140 mg/dL      Peak postprandial:   less than 180 mg/dL (1-2 hours)      Critically ill patients:  140 - 180 mg/dL   Lab Results  Component Value Date   GLUCAP 190 (H) 05/22/2017   HGBA1C 5.1 05/21/2017    Review of Glycemic Control Results for [name], [name] (MRN [id number]) as of 05/22/2017 12:50  Ref. Range 05/21/2017 16:10 05/21/2017 21:33 05/22/2017 07:28 05/22/2017 11:23  Glucose-Capillary Latest Ref Range: 65 - 99 mg/dL 207 (H) 171 (H) 201 (H) 190 (H)   Diabetes history: Type 2 DM Outpatient Diabetes medications: Amaryl 2 mg in AM, Novolog SSI TIDAC, Tradjenta 5 mg QD. Current: Amaryl 2 mg in AM, Novolog 6 units TIDAC, Novolog 0-5 units QHS, Novolog 0-15 units TID. Prednisone 20 mg in AM.   Inpatient Diabetes Program Recommendations:    BS in the setting of steroids and sepsis are increased. Noted new changes. No additional recs.   Of mention: A1C is 5.1%, indicating good control, however, concern for lows given patient is on a Sulfonylurea and prior to admin of med A1C was 6.6%. Would plan to monitor BS closely as inpatient condition resolves and if steroids are tapered for hypoglycemic episodes. Would consider not discharging patient home on Amaryl due to A1C, age, and mental status on admission.   Thanks, Bronson Curb, MSN, RNC-OB Diabetes Coordinator [phone] (8a-5p)

## 2017-05-23 LAB — GLUCOSE, CAPILLARY
GLUCOSE-CAPILLARY: 120 mg/dL — AB (ref 65–99)
GLUCOSE-CAPILLARY: 176 mg/dL — AB (ref 65–99)
Glucose-Capillary: 128 mg/dL — ABNORMAL HIGH (ref 65–99)
Glucose-Capillary: 242 mg/dL — ABNORMAL HIGH (ref 65–99)

## 2017-05-23 LAB — CATH TIP CULTURE: Culture: 8000 — AB

## 2017-05-23 LAB — BASIC METABOLIC PANEL
Anion gap: 7 (ref 5–15)
BUN: 21 mg/dL — ABNORMAL HIGH (ref 6–20)
CALCIUM: 8.6 mg/dL — AB (ref 8.9–10.3)
CHLORIDE: 111 mmol/L (ref 101–111)
CO2: 25 mmol/L (ref 22–32)
CREATININE: 0.74 mg/dL (ref 0.44–1.00)
Glucose, Bld: 162 mg/dL — ABNORMAL HIGH (ref 65–99)
Potassium: 3.5 mmol/L (ref 3.5–5.1)
SODIUM: 143 mmol/L (ref 135–145)

## 2017-05-23 LAB — CBC
HCT: 35.8 % — ABNORMAL LOW (ref 36.0–46.0)
HEMOGLOBIN: 11 g/dL — AB (ref 12.0–15.0)
MCH: 32 pg (ref 26.0–34.0)
MCHC: 30.7 g/dL (ref 30.0–36.0)
MCV: 104.1 fL — ABNORMAL HIGH (ref 78.0–100.0)
Platelets: 123 10*3/uL — ABNORMAL LOW (ref 150–400)
RBC: 3.44 MIL/uL — ABNORMAL LOW (ref 3.87–5.11)
RDW: 16.3 % — AB (ref 11.5–15.5)
WBC: 8.5 10*3/uL (ref 4.0–10.5)

## 2017-05-23 LAB — PROCALCITONIN: Procalcitonin: <0.1 ng/mL

## 2017-05-23 LAB — TSH: TSH: 1.593 u[IU]/mL (ref 0.350–4.500)

## 2017-05-23 MED ORDER — TRAMADOL HCL 50 MG PO TABS: 50.0000 mg | ORAL_TABLET | Freq: Two times a day (BID) | ORAL | 0 refills | Status: DC | PRN

## 2017-05-23 MED ORDER — PREDNISONE 1 MG PO TABS: ORAL_TABLET | ORAL | Status: AC

## 2017-05-23 MED ORDER — PREDNISONE 5 MG PO TABS: ORAL_TABLET | ORAL | Status: DC

## 2017-05-23 NOTE — NC FL2 (Signed)
[place] LEVEL OF CARE SCREENING TOOL     IDENTIFICATION  Patient Name: [name] Birthdate: 11/01/1924 Sex: female Admission Date (Current Location): 05/19/2017  [facility] and [place] Number:  Kathleen Argue [id number] Facility and Address:  [facility], [place] [address], [place], [place] [address]      Provider Number: [id number]  Attending Physician Name and Address:  Mendel Corning, MD  Relative Name and Phone Number:  Rita Ohara [facility]) [phone]    Current Level of Care: SNF Recommended Level of Care: [place] Prior Approval Number:    Date Approved/Denied:   PASRR Number:    Discharge Plan: SNF    Current Diagnoses: Patient Active Problem List   Diagnosis Date Noted  . Sepsis ([place]) 05/19/2017  . Malnutrition of moderate degree 04/03/2017  . Pressure injury of skin 04/02/2017  . Shock ([place]) 04/01/2017  . Left leg pain 03/31/2017  . Periprosthetic fracture around internal prosthetic right hip joint ([place]) 03/17/2017  . AF (paroxysmal atrial fibrillation) ([place]) 03/13/2017  . Closed fracture of condyle of right femur ([place]) 03/13/2017  . Type 2 diabetes mellitus with renal manifestations ([place]) 03/13/2017  . Femur fracture ([place]) 03/13/2017  . Atrophic vaginitis 12/11/2013  . UTI (lower urinary tract infection) 11/16/2013  . Acute encephalopathy 11/16/2013  . FTT (failure to thrive) in adult 11/16/2013  . Subdural hematoma ([place]) 11/06/2013  . Fall 11/06/2013  . Hematoma of leg 09/04/2013  . Generalized osteoarthritis of hand 09/04/2013  . Cerumen impaction 09/04/2013  . Rales 01/15/2013  . Chronic kidney disease, stage III (moderate) ([place]) 12/19/2012  . Sinus congestion 10/24/2012  . Fracture of femoral neck, right, closed ([place]) 08/02/2012  . Hemoperitoneum (nontraumatic) 06/19/2012  . Malignant neoplasm of renal pelvis ([place]) 06/19/2012  . Dysphagia, unspecified(787.20) 06/19/2012  .  Urinary frequency 06/19/2012  . Unspecified late effects of cerebrovascular disease 06/19/2012  . Personal history of fall 06/19/2012  . Abnormality of gait 06/19/2012  . Synovial cyst of popliteal space 06/19/2012  . Muscle weakness (generalized) 06/19/2012  . Osteoporosis, unspecified 06/19/2012  . Anxiety state, unspecified 06/19/2012  . Osteoarthrosis, unspecified whether generalized or localized, unspecified site 06/19/2012  . Leg pain 06/19/2012  . Knee joint replacement by other means 06/19/2012  . Pain in joint, lower leg 06/19/2012  . Hyperlipidemia 06/19/2012  . Cervicalgia 06/19/2012  . Lack of coordination 06/19/2012  . Pain in joint, shoulder region 06/19/2012  . Dermatophytosis of nail 06/19/2012  . Unspecified urinary incontinence 06/19/2012  . Herpes zoster without mention of complication 37/85/8850  . Unspecified hereditary and idiopathic peripheral neuropathy 06/19/2012  . Osteoarthrosis, unspecified whether generalized or localized, lower leg 06/19/2012  . Lumbago 06/19/2012  . Enthesopathy of hip region 06/19/2012  . Internal hemorrhoids without mention of complication 27/74/1287  . Diaphragmatic hernia without mention of obstruction or gangrene 06/19/2012  . Renal abscess, right 06/11/2012  . Recurrent UTI 06/07/2012  . SIRS (systemic inflammatory response syndrome) ([place]) 06/07/2012  . Retroperitoneal bleed 03/20/2012  . Chronic heart failure ([place]) 03/20/2012  . 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 Degenerative arthritis of cervical spine 02/09/2011  . Degenerative arthritis of hip 02/09/2011  . Degenerative arthritis of knee 02/09/2011  . Carpal tunnel syndrome of right wrist 02/09/2011  . TIA (transient ischemic attack) 02/09/2011  . Macrocytic anemia 01/31/2011  . Hypoxia 01/31/2011  . Renal oncocytoma 01/12/2011  . Type II or unspecified type diabetes mellitus with renal manifestations, uncontrolled(250.42) 01/11/2011  . Essential hypertension  01/11/2011  . Polymyalgia rheumatica ([place]) 01/11/2011  . Depression 01/11/2011    Orientation RESPIRATION BLADDER Height & Weight     Self  Normal External catheter Weight: 163 lb 2.3 oz (74 kg) Height:  5\' 1"  (154.9 cm)  BEHAVIORAL SYMPTOMS/MOOD NEUROLOGICAL BOWEL NUTRITION STATUS      Incontinent Diet(Heart healthy/ carb modified. Fluid consistency - thin)  AMBULATORY STATUS COMMUNICATION OF NEEDS Skin   Total Care Verbally Normal                       Personal Care Assistance Level of Assistance  Total care Bathing Assistance: Maximum assistance Feeding assistance: Maximum assistance Dressing Assistance: Maximum assistance Total Care Assistance: Maximum assistance   Functional Limitations Info  Sight, Hearing, Speech Sight Info: Adequate Hearing Info: Adequate Speech Info: Adequate    SPECIAL CARE FACTORS FREQUENCY                       Contractures Contractures Info: Not present    Additional Factors Info  Code Status, Allergies, Insulin Sliding Scale Code Status Info: DNR Allergies Info: Allegra (Fexofenadine HCL), Levaquin (Levofloxacin in D5W), Tussionex Pennkinetic Er (Hydrocod Polst-cpm Polst Er), Codeine, Morphine and related    Insulin Sliding Scale Info: 0-5 units daily at bedtime.        Current Medications (05/23/2017):  This is the current hospital active medication list Current Facility-Administered Medications  Medication Dose Route Frequency Provider Last Rate Last Dose  . acetaminophen (TYLENOL) tablet 650 mg  650 mg Oral Q6H PRN Fuller Plan A, MD       Or  . acetaminophen (TYLENOL) suppository 650 mg  650 mg Rectal Q6H PRN Fuller Plan A, MD      . apixaban (ELIQUIS) tablet 5 mg  5 mg Oral BID Tamala Julian, Rondell A, MD   5 mg at 05/23/17 1057  . collagenase (SANTYL) ointment 1 application  1 application Topical Daily Fuller Plan A, MD   1 application at 65/99/35 1040  . famotidine (PEPCID) tablet 20 mg  20 mg Oral Daily Smith,  Rondell A, MD   20 mg at 05/23/17 1056  . feeding supplement (GLUCERNA SHAKE) (GLUCERNA SHAKE) liquid 237 mL  237 mL Oral BID BM Smith, Rondell A, MD   237 mL at 05/23/17 1056  . ferrous sulfate tablet 325 mg  325 mg Oral BID WC Smith, Rondell A, MD   325 mg at 05/23/17 0916  . gabapentin (NEURONTIN) capsule 300 mg  300 mg Oral Daily Smith, Rondell A, MD   300 mg at 05/23/17 1056  . gabapentin (NEURONTIN) capsule 600 mg  600 mg Oral QHS Smith, Rondell A, MD   600 mg at 05/22/17 2127  . glimepiride (AMARYL) tablet 2 mg  2 mg Oral Q breakfast Rai, Ripudeep K, MD   2 mg at 05/23/17 0915  . insulin aspart (novoLOG) injection 0-15 Units  0-15 Units Subcutaneous TID WC Rai, Ripudeep K, MD   2 Units at 05/22/17 1743  . insulin aspart (novoLOG) injection 0-5 Units  0-5 Units Subcutaneous QHS Rai, Ripudeep K, MD      . insulin aspart (novoLOG) injection 6 Units  6 Units Subcutaneous TID WC Rai, Ripudeep K, MD   6 Units at 05/22/17 1742  . ipratropium-albuterol (DUONEB) 0.5-2.5 (3) MG/3ML nebulizer solution 3 mL  3 mL Nebulization Q4H PRN Smith, Rondell A, MD      . metoprolol tartrate (LOPRESSOR) tablet 12.5 mg  12.5  mg Oral BID Fuller Plan A, MD   12.5 mg at 05/23/17 1056  . mirtazapine (REMERON) tablet 15 mg  15 mg Oral QHS Fuller Plan A, MD   15 mg at 05/22/17 2127  . ondansetron (ZOFRAN) tablet 4 mg  4 mg Oral Q6H PRN Fuller Plan A, MD       Or  . ondansetron (ZOFRAN) injection 4 mg  4 mg Intravenous Q6H PRN Smith, Rondell A, MD      . oxybutynin (DITROPAN) tablet 5 mg  5 mg Oral TID PRN Fuller Plan A, MD      . predniSONE (DELTASONE) tablet 20 mg  20 mg Oral Q breakfast Rai, Ripudeep K, MD   20 mg at 05/23/17 0915  . senna (SENOKOT) tablet 8.6 mg  1 tablet Oral Daily PRN Smith, Rondell A, MD      . sodium chloride flush (NS) 0.9 % injection 10-40 mL  10-40 mL Intracatheter Q12H Carmin Muskrat, MD   10 mL at 05/20/17 0046  . sodium chloride flush (NS) 0.9 % injection 10-40 mL  10-40 mL  Intracatheter PRN Carmin Muskrat, MD         Discharge Medications: Please see discharge summary for a list of discharge medications.  Relevant Imaging Results:  Relevant Lab Results:   Additional Information ssn# [id number]  Mikle Bosworth Work [phone]

## 2017-05-23 NOTE — Clinical Social Work Note (Signed)
CSW reviewed CSW intern's assessment note and offered suggestions in its completion and assisted in completion of the FL-2. Assessment note and FL-2 approved as completed.  [name], MSW, LCSW Licensed Clinical Social Worker [place] [phone]

## 2017-05-23 NOTE — Progress Notes (Addendum)
[facility]                                                                              Patient Demographics  [name], is a 84 y.o. female, DOB - [birth year], KPQ:[id number]  Admit date - 05/19/2017   Admitting Physician Norval Morton, MD  Outpatient Primary MD for the patient is Patient, No Pcp Per  Outpatient specialists:   LOS - 4  days   Medical records reviewed and are as summarized below:    Chief Complaint  Patient presents with  . Altered Mental Status       Brief summary   Patient is a 84 year old female with  HTN, A. fib, chronic diastolic CHF, DM type II with neuropathy, COPD, temporal arteritis, PMR, CKD, ITP, DVT on Eliquis, and osteoporosis; who presented for change in mental status noted over the last day.At baseline patient is noted to be conversant and over the last 24 hours have been more lethargic and less responsive.  Patient was hospitalized from 1/7-1/12 after suffering a fall with right femur fracture undergoing ORIF by Dr. Doreatha Martin with postop complication of acute right DVT with subsequent increased of Eliquis dose.  Patient was readmitted to the hospital from 1/25-1/31 suffering thought secondary to UTI treated initially with Rocephin then sent home initially on Keflex which she completed.  Review of records shows that patient was subsequently started on Augmentin on 3/7 and then switched to Zosyn. In ED, temp of 100.6, BP low in 80s, heart rate up to 105, respirations 18-24, lactic acid 1.34, influenza negative chest x-ray clear.  Patient was admitted for sepsis with unclear etiology   Assessment & Plan    Principal Problem:   Sepsis ([place]): Unclear etiology -patient met sepsis criteria at the time of admission with fever of 100.6, hypotension, tachycardia however source unclear.  UA negative, chest x-ray showed no consolidation or infiltrates.  Influenza panel negative -left arm midline removed, follow blood cultures, negative so  far - catheter tip cultures showed 8000 colonies of coagulase-negative staph -Currently on IV vancomycin and Zosyn. - sepsis may or may not related to central line, clinically undetermined, will follow cultures.  Discussed with Dr. Megan Salon, infectious disease, recommended to DC antibiotics and observe for next 24 hours off antibiotics. Addendum:4:46pm Called by RN for temp/hypothermia 95.6 - apply bear hugger, check CBG -  Obtain blood cultures x2, procalcitonin, TSH  Active Problems: Acute metabolic encephalopathy -Alert and oriented x2, close to her baseline,  Likely due to #1 - CT head negative for any acute Intracranial pathology  Essential hypertension -BP stable, continue metoprolol.     Polymyalgia rheumatica ([place]) -Per previous DC summary from 03/2017, patient was on home dose of prednisone 3 mg daily, then was on prednisone taper.  Unclear if patient is still on prednisone maintenance dose.,  Will have pharmacy verify prednisone and why patient has PICC.  -ESR, CRP elevated, restart prednisone, for now place on prednisone 20 mg daily.  Received Decadron 10 mg IV x1 in ED -Continue prednisone 20 mg daily and will taper by 5 mg every week to maintenance dose of 3 mg daily.  Chronic macrocytic anemia -B12 is 929 and normal limits, folate WNL     AF (paroxysmal atrial fibrillation) (HCC) -Currently rate controlled, continue metoprolol -Continue Eliquis    Type 2 diabetes mellitus with renal manifestations (HCC) -CBGs uncontrolled likely due to steroids, hold tradjenta -CBGs now stable, continue sliding scale insulin, NovoLog meal coverage, Amaryl -Hemoglobin A1c 5.1, hold Amaryl at the time of discharge due to potential of causing hypoglycemia  History of chronic diastolic CHF Not in any fluid overload, continue beta-blocker No recent echocardiogram, 2D echo in 2012 with EF of 65 to 70% with grade 1 diastolic dysfunction  Chronic thrombocytopenia -Improving, monitor  counts   Code Status: DNR  DVT Prophylaxis: apixaban Family Communication: No family member at the bedside   Disposition Plan: Possible DC in a.m.  Time Spent in minutes  25 minutes  Procedures:  None  Consultants:   None  Antimicrobials:   IV vancomycin 3/15  IV Zosyn 3/15   Medications  Scheduled Meds: . apixaban  5 mg Oral BID  . collagenase  1 application Topical Daily  . famotidine  20 mg Oral Daily  . feeding supplement (GLUCERNA SHAKE)  237 mL Oral BID BM  . ferrous sulfate  325 mg Oral BID WC  . gabapentin  300 mg Oral Daily  . gabapentin  600 mg Oral QHS  . glimepiride  2 mg Oral Q breakfast  . insulin aspart  0-15 Units Subcutaneous TID WC  . insulin aspart  0-5 Units Subcutaneous QHS  . insulin aspart  6 Units Subcutaneous TID WC  . metoprolol tartrate  12.5 mg Oral BID  . mirtazapine  15 mg Oral QHS  . predniSONE  20 mg Oral Q breakfast  . sodium chloride flush  10-40 mL Intracatheter Q12H   Continuous Infusions:  PRN Meds:.acetaminophen **OR** acetaminophen, ipratropium-albuterol, ondansetron **OR** ondansetron (ZOFRAN) IV, oxybutynin, senna, sodium chloride flush   Antibiotics   Anti-infectives (From admission, onward)   Start     Dose/Rate Route Frequency Ordered Stop   05/20/17 2100  vancomycin (VANCOCIN) IVPB 1000 mg/200 mL premix  Status:  Discontinued     1,000 mg 200 mL/hr over 60 Minutes Intravenous Every 24 hours 05/19/17 2148 05/23/17 0946   05/20/17 1000  piperacillin-tazobactam (ZOSYN) IVPB 3.375 g  Status:  Discontinued     3.375 g 12.5 mL/hr over 240 Minutes Intravenous Every 8 hours 05/20/17 0957 05/23/17 0946   05/20/17 0600  piperacillin-tazobactam (ZOSYN) IVPB 3.375 g  Status:  Discontinued     3.375 g 12.5 mL/hr over 240 Minutes Intravenous Every 8 hours 05/19/17 2147 05/20/17 0957   05/19/17 2200  piperacillin-tazobactam (ZOSYN) injection 3.375 g  Status:  Discontinued     3.375 g Intramuscular Every 8 hours 05/19/17 2134  05/19/17 2142   05/19/17 2145  piperacillin-tazobactam (ZOSYN) IVPB 3.375 g     3.375 g 100 mL/hr over 30 Minutes Intravenous  Once 05/19/17 2143 05/20/17 0130   05/19/17 1945  vancomycin (VANCOCIN) IVPB 1000 mg/200 mL premix     1,000 mg 200 mL/hr over 60 Minutes Intravenous  Once 05/19/17 1935 05/19/17 2114        Subjective:   [name] was seen and examined today.  No fevers, alert and awake, mildly hypothermic earlier this morning.  Denies any specific complaints.  No chest pain, shortness of breath, abdominal pain nausea vomiting.  No acute issues overnight.   Objective:   Vitals:   05/22/17 2049 05/22/17 2104 05/23/17 0605 05/23/17 1042  BP: 108/77  128/78 130/74  Pulse: 94  71 74  Resp: '14  13 15  ' Temp:  (!) 97.5 F (36.4 C) (!) 96.8 F (36 C) 97.6 F (36.4 C)  TempSrc:  Rectal Rectal Axillary  SpO2: 100%  100% 100%  Weight: 74 kg (163 lb 2.3 oz)     Height:        Intake/Output Summary (Last 24 hours) at 05/23/2017 1210 Last data filed at 05/23/2017 0818 Gross per 24 hour  Intake 2470 ml  Output 1200 ml  Net 1270 ml     Wt Readings from Last 3 Encounters:  05/22/17 74 kg (163 lb 2.3 oz)  04/05/17 74.7 kg (164 lb 10.9 oz)  03/15/17 65.3 kg (144 lb)     Exam   General: Alert and oriented x 2, pleasant and cooperative, appears to be close to baseline   Eyes:   HEENT:  Atraumatic, normocephalic  Cardiovascular: S1 S2 auscultated,  Regular rate and rhythm. No pedal edema b/l  Respiratory: CTAB  Gastrointestinal: Soft, nontender, nondistended, + bowel sounds  Ext: no pedal edema bilaterally  Neuro: no new deficits  Musculoskeletal: No digital cyanosis, clubbing  Skin: No rashes Psych: alert and oriented x2  Data Reviewed:  I have personally reviewed following labs and imaging studies  Micro Results Recent Results (from the past 240 hour(s))  Urine culture     Status: Abnormal   Collection Time: 05/19/17 12:10 PM  Result Value Ref  Range Status   Specimen Description URINE, RANDOM  Final   Special Requests NONE  Final   Culture (A)  Final    <10,000 COLONIES/mL INSIGNIFICANT GROWTH Performed at [facility], [address] [place], [place] [address]    Report Status 05/20/2017 FINAL  Final  MRSA PCR Screening     Status: None   Collection Time: 05/20/17  2:12 AM  Result Value Ref Range Status   MRSA by PCR NEGATIVE NEGATIVE Final    Comment:        The GeneXpert MRSA Assay (FDA approved for NASAL specimens only), is one component of a comprehensive MRSA colonization surveillance program. It is not intended to diagnose MRSA infection nor to guide or monitor treatment for MRSA infections. Performed at [facility], [place] [address] [place], [place] [address]   Culture, blood (routine x 2)     Status: None (Preliminary result)   Collection Time: 05/21/17  9:18 AM  Result Value Ref Range Status   Specimen Description BLOOD RIGHT ANTECUBITAL  Final   Special Requests   Final    BOTTLES DRAWN AEROBIC AND ANAEROBIC Blood Culture adequate volume   Culture   Final    NO GROWTH 2 DAYS Performed at [facility], [place] [address], [place], [place] [address]    Report Status PENDING  Incomplete  Culture, blood (routine x 2)     Status: None (Preliminary result)   Collection Time: 05/21/17  9:25 AM  Result Value Ref Range Status   Specimen Description BLOOD RIGHT ARM  Final   Special Requests   Final    BOTTLES DRAWN AEROBIC AND ANAEROBIC Blood Culture adequate volume   Culture   Final    NO GROWTH 2 DAYS Performed at [facility], [address] [place], [place] [address]    Report Status PENDING  Incomplete  Cath Tip Culture     Status: Abnormal   Collection Time: 05/21/17  9:32 AM  Result  Value Ref Range Status   Specimen Description ARM LEFT URINE, CATHETERIZED CENTRAL LINE  Final   Special Requests   Final    NONE Performed at [facility], [address],  [place], [place] [address]    Culture (A)  Final    8,000 COLONIES/mL STAPHYLOCOCCUS SPECIES (COAGULASE NEGATIVE)   Report Status 05/23/2017 FINAL  Final    Radiology Reports Ct Head Wo Contrast  Result Date: 05/19/2017 CLINICAL DATA:  Increased lethargy EXAM: CT HEAD WITHOUT CONTRAST TECHNIQUE: Contiguous axial images were obtained from the base of the skull through the vertex without intravenous contrast. COMPARISON:  03/31/2017 FINDINGS: Brain: Mild atrophic and chronic white matter ischemic changes are noted. No findings to suggest acute hemorrhage, acute infarction or space-occupying mass lesion are noted. Vascular: No hyperdense vessel or unexpected calcification. Skull: Normal. Negative for fracture or focal lesion. Sinuses/Orbits: Opacification of the left maxillary antrum is noted stable from the prior exam. Air-fluid level is noted within the sphenoid sinus on the right. Fluid is noted within the mastoid air cells on the left. Other: None. IMPRESSION: Chronic changes in the left maxillary antrum. Fluid within the right sphenoid sinus and left mastoid air cells. Atrophic and ischemic changes without acute abnormality. Electronically Signed   By: Inez Catalina M.D.   On: 05/19/2017 14:54   Dg Chest Port 1 View  Result Date: 05/19/2017 CLINICAL DATA:  Loss of consciousness. EXAM: PORTABLE CHEST 1 VIEW COMPARISON:  Chest x-ray dated March 31, 2017. FINDINGS: Stable borderline cardiomegaly. Normal pulmonary vascularity. Unchanged scarring/atelectasis at the left lung base. No focal consolidation, pleural effusion, or pneumothorax. No acute osseous abnormality. IMPRESSION: No active disease. Electronically Signed   By: Titus Dubin M.D.   On: 05/19/2017 12:18    Lab Data:  CBC: Recent Labs  Lab 05/19/17 1225 05/20/17 0412 05/20/17 0413 05/21/17 0438 05/22/17 1059 05/23/17 0620  WBC 9.5  --  5.9 8.7 10.1 8.5  NEUTROABS 5.7  --   --   --   --   --   HGB 11.4*  --  11.9* 10.3* 12.3 11.0*   HCT 36.2 35.3 37.8 32.1* 39.5 35.8*  MCV 102.0*  --  103.0* 101.3* 102.6* 104.1*  PLT 125*  --  120* 136* 138* 734*   Basic Metabolic Panel: Recent Labs  Lab 05/19/17 1225 05/20/17 0413 05/21/17 0438 05/22/17 1059 05/23/17 0620  NA 135 139 139 140 143  K 3.9 4.7 4.5 5.3* 3.5  CL 101 105 109 110 111  CO2 24 20* 23 18* 25  GLUCOSE 101* 175* 304* 216* 162*  BUN 13 14 22* 25* 21*  CREATININE 0.90 0.94 0.92 0.76 0.74  CALCIUM 8.6* 8.7* 8.5* 8.7* 8.6*   GFR: Estimated Creatinine Clearance: 43 mL/min (by C-G formula based on SCr of 0.74 mg/dL). Liver Function Tests: Recent Labs  Lab 05/19/17 1225  AST 32  ALT 26  ALKPHOS 85  BILITOT 1.0  PROT 5.6*  ALBUMIN 2.4*   No results for input(s): LIPASE, AMYLASE in the last 168 hours. No results for input(s): AMMONIA in the last 168 hours. Coagulation Profile: Recent Labs  Lab 05/19/17 1225  INR 1.92   Cardiac Enzymes: No results for input(s): CKTOTAL, CKMB, CKMBINDEX, TROPONINI in the last 168 hours. BNP (last 3 results) No results for input(s): PROBNP in the last 8760 hours. HbA1C: Recent Labs    05/21/17 0438  HGBA1C 5.1   CBG: Recent Labs  Lab 05/22/17 1123 05/22/17 1632 05/22/17 2039 05/23/17  2751 05/23/17 1129  GLUCAP 190* 135* 149* 120* 176*   Lipid Profile: No results for input(s): CHOL, HDL, LDLCALC, TRIG, CHOLHDL, LDLDIRECT in the last 72 hours. Thyroid Function Tests: No results for input(s): TSH, T4TOTAL, FREET4, T3FREE, THYROIDAB in the last 72 hours. Anemia Panel: No results for input(s): VITAMINB12, FOLATE, FERRITIN, TIBC, IRON, RETICCTPCT in the last 72 hours. Urine analysis:    Component Value Date/Time   COLORURINE YELLOW 05/19/2017 1205   APPEARANCEUR HAZY (A) 05/19/2017 1205   APPEARANCEUR Clear 05/07/2013 1436   LABSPEC 1.025 05/19/2017 1205   PHURINE 5.0 05/19/2017 1205   GLUCOSEU NEGATIVE 05/19/2017 1205   HGBUR MODERATE (A) 05/19/2017 1205   BILIRUBINUR NEGATIVE 05/19/2017 1205     BILIRUBINUR Neg 12/11/2013 1345   BILIRUBINUR Negative 05/07/2013 1436   KETONESUR 5 (A) 05/19/2017 1205   PROTEINUR NEGATIVE 05/19/2017 1205   UROBILINOGEN 0.2 12/11/2013 1345   UROBILINOGEN 1.0 11/16/2013 1256   NITRITE NEGATIVE 05/19/2017 1205   LEUKOCYTESUR TRACE (A) 05/19/2017 1205   LEUKOCYTESUR Negative 05/07/2013 1436     Ripudeep Rai M.D. [facility] 05/23/2017, 12:10 PM  Pager: [phone] Between 7am to 7pm - call Pager - [phone]  After 7pm go to [email or web address] - password [email or web address]  Call night coverage person covering after 7pm           [facility]                                                                              Patient Demographics  [name], is a 84 y.o. female, DOB - 1924-08-21, [place] date - 05/19/2017   Admitting Physician Norval Morton, MD  Outpatient Primary MD for the patient is Patient, No Pcp Per  Outpatient specialists:   LOS - 4  days   Medical records reviewed and are as summarized below:    Chief Complaint  Patient presents with  . Altered Mental Status       Brief summary   Patient is a 84 year old female with  HTN, A. fib, chronic diastolic CHF, DM type II with neuropathy, COPD, temporal arteritis, PMR, CKD, ITP, DVT on Eliquis, and osteoporosis; who presented for change in mental status noted over the last day.At baseline patient is noted to be conversant and over the last 24 hours have been more lethargic and less responsive.  Patient was hospitalized from 1/7-1/12 after suffering a fall with right femur fracture undergoing ORIF by Dr. Doreatha Martin with postop complication of acute right DVT with subsequent increased of Eliquis dose.  Patient was readmitted to the hospital from 1/25-1/31 suffering thought secondary to UTI treated initially with Rocephin then sent home initially on Keflex which she completed.  Review of records shows that patient was subsequently started on Augmentin on 3/7 and then  switched to Zosyn. In ED, temp of 100.6, BP low in 80s, heart rate up to 105, respirations 18-24, lactic acid 1.34, influenza negative chest x-ray clear.  Patient was admitted for sepsis with unclear etiology   Assessment & Plan    Principal Problem:   Sepsis ([place]) -Unclear etiology, patient met sepsis criteria at the time of admission with fever of 100.6, hypotension, tachycardia however source unclear.  UA negative, chest x-ray showed no consolidation or infiltrates.  Influenza panel negative - Called SNF again today am, apparently patient was receiving IV Zosyn every 8 hours via central midline for UTI.  Last dose was today.  UA and culture negative this hospitalization. - Unfortunately blood cultures were not collected at the time of admission, ordered with the catheter tip culture. -will remove the central line   Active Problems: Acute metabolic encephalopathy -Much more alert and awake today, oriented to self and place.  Likely due to #1 - CT head negative for any acute Intracranial pathology  Essential hypertension -BP now improving, continue metoprolol   Polymyalgia rheumatica ([place]) -Per previous DC summary from 03/2017, patient was on home dose of prednisone 3 mg daily, then was on prednisone taper.  Unclear if patient is still on prednisone maintenance dose.,  Will have pharmacy verify prednisone and why patient has PICC.  -ESR, CRP elevated, restart prednisone, for now place on prednisone 20 mg daily.  Received Decadron 10 mg IV x1 in ED -Continue prednisone 20 mg daily and will taper by 5 mg every week to maintenance dose of 3 mg daily.   Chronic macrocytic anemia -B12 is 929 and normal limits, folate in process     AF (paroxysmal atrial fibrillation) (HCC) -Currently rate controlled, continue metoprolol -Continue Eliquis    Type 2 diabetes mellitus with renal manifestations (HCC) -CBGs uncontrolled likely due to steroids, hold Amaryl, tradjenta -Placed on sliding  scale insulin, NovoLog 4 units TID AC, hemoglobin A1c in am  History of chronic diastolic CHF Not in any fluid overload, continue beta-blocker No recent echocardiogram, 2D echo in 2012 with EF of 65 to 70% with grade 1 diastolic dysfunction  Chronic thrombocytopenia -Improving, monitor counts   Code Status: DNR  DVT Prophylaxis: apixaban Family Communication: No family member at the bedside   Disposition Plan: Back to skilled nursing facility when medically stable  Time Spent in minutes  25 minutes  Procedures:  None  Consultants:   None  Antimicrobials:   IV vancomycin 3/15  IV Zosyn 3/15   Medications  Scheduled Meds: . apixaban  5 mg Oral BID  . collagenase  1 application Topical Daily  . famotidine  20 mg Oral Daily  . feeding supplement (GLUCERNA SHAKE)  237 mL Oral BID BM  . ferrous sulfate  325 mg Oral BID WC  . gabapentin  300 mg Oral Daily  . gabapentin  600 mg Oral QHS  . glimepiride  2 mg Oral Q breakfast  . insulin aspart  0-15 Units Subcutaneous TID WC  . insulin aspart  0-5 Units Subcutaneous QHS  . insulin aspart  6 Units Subcutaneous TID WC  . metoprolol tartrate  12.5 mg Oral BID  . mirtazapine  15 mg Oral QHS  . predniSONE  20 mg Oral Q breakfast  . sodium chloride flush  10-40 mL Intracatheter Q12H   Continuous Infusions:  PRN Meds:.acetaminophen **OR** acetaminophen, ipratropium-albuterol, ondansetron **OR** ondansetron (ZOFRAN) IV, oxybutynin, senna, sodium chloride flush   Antibiotics   Anti-infectives (From admission, onward)   Start     Dose/Rate Route Frequency Ordered Stop   05/20/17 2100  vancomycin (VANCOCIN) IVPB 1000 mg/200 mL premix  Status:  Discontinued     1,000 mg 200 mL/hr over 60 Minutes Intravenous Every 24 hours 05/19/17 2148 05/23/17 0946   05/20/17 1000  piperacillin-tazobactam (ZOSYN) IVPB 3.375 g  Status:  Discontinued     3.375 g 12.5 mL/hr over 240 Minutes Intravenous  Every 8 hours 05/20/17 0957 05/23/17 0946    05/20/17 0600  piperacillin-tazobactam (ZOSYN) IVPB 3.375 g  Status:  Discontinued     3.375 g 12.5 mL/hr over 240 Minutes Intravenous Every 8 hours 05/19/17 2147 05/20/17 0957   05/19/17 2200  piperacillin-tazobactam (ZOSYN) injection 3.375 g  Status:  Discontinued     3.375 g Intramuscular Every 8 hours 05/19/17 2134 05/19/17 2142   05/19/17 2145  piperacillin-tazobactam (ZOSYN) IVPB 3.375 g     3.375 g 100 mL/hr over 30 Minutes Intravenous  Once 05/19/17 2143 05/20/17 0130   05/19/17 1945  vancomycin (VANCOCIN) IVPB 1000 mg/200 mL premix     1,000 mg 200 mL/hr over 60 Minutes Intravenous  Once 05/19/17 1935 05/19/17 2114        Subjective:   [name] was seen and examined today.  Much more alert and awake today, oriented to self and place.  Denies any pain.  No chest pain, shortness of breath, abdominal pain nausea vomiting.  No fevers.     Objective:   Vitals:   05/22/17 2049 05/22/17 2104 05/23/17 0605 05/23/17 1042  BP: 108/77  128/78 130/74  Pulse: 94  71 74  Resp: '14  13 15  ' Temp:  (!) 97.5 F (36.4 C) (!) 96.8 F (36 C) 97.6 F (36.4 C)  TempSrc:  Rectal Rectal Axillary  SpO2: 100%  100% 100%  Weight: 74 kg (163 lb 2.3 oz)     Height:        Intake/Output Summary (Last 24 hours) at 05/23/2017 1210 Last data filed at 05/23/2017 0818 Gross per 24 hour  Intake 2470 ml  Output 1200 ml  Net 1270 ml     Wt Readings from Last 3 Encounters:  05/22/17 74 kg (163 lb 2.3 oz)  04/05/17 74.7 kg (164 lb 10.9 oz)  03/15/17 65.3 kg (144 lb)     Exam   General: much more alert and awake today, oriented to self and place, NAD    Eyes:  HEENT:  Atraumatic, normocephalic   Cardiovascular: S1 S2 auscultated, Regular rate and rhythm. No pedal edema b/l  Respiratory: Clear to auscultation bilaterally, no wheezing, rales or rhonchi  Gastrointestinal: Soft, nontender, nondistended, + bowel sounds  Ext: no pedal edema bilaterally  Neuro: no new deficits,  strength 5/5 upper and lower extremity b/l, hands in mittens  Musculoskeletal: No digital cyanosis, clubbing  Skin: No rashes  Psych: Normal affect and demeanor, alert and oriented x2   Data Reviewed:  I have personally reviewed following labs and imaging studies  Micro Results Recent Results (from the past 240 hour(s))  Urine culture     Status: Abnormal   Collection Time: 05/19/17 12:10 PM  Result Value Ref Range Status   Specimen Description URINE, RANDOM  Final   Special Requests NONE  Final   Culture (A)  Final    <10,000 COLONIES/mL INSIGNIFICANT GROWTH Performed at [facility], [address], [place], [place] [address]    Report Status 05/20/2017 FINAL  Final  MRSA PCR Screening     Status: None   Collection Time: 05/20/17  2:12 AM  Result Value Ref Range Status   MRSA by PCR NEGATIVE NEGATIVE Final    Comment:        The GeneXpert MRSA Assay (FDA approved for NASAL specimens only), is one component of a comprehensive MRSA colonization surveillance program. It is not intended to diagnose MRSA infection nor to guide or monitor treatment for MRSA  infections. Performed at [facility], [place] [address] [place], [place] [address]   Culture, blood (routine x 2)     Status: None (Preliminary result)   Collection Time: 05/21/17  9:18 AM  Result Value Ref Range Status   Specimen Description BLOOD RIGHT ANTECUBITAL  Final   Special Requests   Final    BOTTLES DRAWN AEROBIC AND ANAEROBIC Blood Culture adequate volume   Culture   Final    NO GROWTH 2 DAYS Performed at [facility], [place] [address], [place], [place] [address]    Report Status PENDING  Incomplete  Culture, blood (routine x 2)     Status: None (Preliminary result)   Collection Time: 05/21/17  9:25 AM  Result Value Ref Range Status   Specimen Description BLOOD RIGHT ARM  Final   Special Requests   Final    BOTTLES DRAWN AEROBIC AND ANAEROBIC Blood Culture adequate volume   Culture    Final    NO GROWTH 2 DAYS Performed at [facility], [address], [place], [place] [address]    Report Status PENDING  Incomplete  Cath Tip Culture     Status: Abnormal   Collection Time: 05/21/17  9:32 AM  Result Value Ref Range Status   Specimen Description ARM LEFT URINE, CATHETERIZED CENTRAL LINE  Final   Special Requests   Final    NONE Performed at [facility], [address] [place], [place] [address]    Culture (A)  Final    8,000 COLONIES/mL STAPHYLOCOCCUS SPECIES (COAGULASE NEGATIVE)   Report Status 05/23/2017 FINAL  Final    Radiology Reports Ct Head Wo Contrast  Result Date: 05/19/2017 CLINICAL DATA:  Increased lethargy EXAM: CT HEAD WITHOUT CONTRAST TECHNIQUE: Contiguous axial images were obtained from the base of the skull through the vertex without intravenous contrast. COMPARISON:  03/31/2017 FINDINGS: Brain: Mild atrophic and chronic white matter ischemic changes are noted. No findings to suggest acute hemorrhage, acute infarction or space-occupying mass lesion are noted. Vascular: No hyperdense vessel or unexpected calcification. Skull: Normal. Negative for fracture or focal lesion. Sinuses/Orbits: Opacification of the left maxillary antrum is noted stable from the prior exam. Air-fluid level is noted within the sphenoid sinus on the right. Fluid is noted within the mastoid air cells on the left. Other: None. IMPRESSION: Chronic changes in the left maxillary antrum. Fluid within the right sphenoid sinus and left mastoid air cells. Atrophic and ischemic changes without acute abnormality. Electronically Signed   By: Inez Catalina M.D.   On: 05/19/2017 14:54   Dg Chest Port 1 View  Result Date: 05/19/2017 CLINICAL DATA:  Loss of consciousness. EXAM: PORTABLE CHEST 1 VIEW COMPARISON:  Chest x-ray dated March 31, 2017. FINDINGS: Stable borderline cardiomegaly. Normal pulmonary vascularity. Unchanged scarring/atelectasis at the left lung base. No focal  consolidation, pleural effusion, or pneumothorax. No acute osseous abnormality. IMPRESSION: No active disease. Electronically Signed   By: Titus Dubin M.D.   On: 05/19/2017 12:18    Lab Data:  CBC: Recent Labs  Lab 05/19/17 1225 05/20/17 0412 05/20/17 0413 05/21/17 0438 05/22/17 1059 05/23/17 0620  WBC 9.5  --  5.9 8.7 10.1 8.5  NEUTROABS 5.7  --   --   --   --   --   HGB 11.4*  --  11.9* 10.3* 12.3 11.0*  HCT 36.2 35.3 37.8 32.1* 39.5 35.8*  MCV 102.0*  --  103.0* 101.3* 102.6* 104.1*  PLT 125*  --  120* 136* 138* 532*   Basic Metabolic Panel: Recent Labs  Lab 05/19/17 1225 05/20/17 0413 05/21/17 0438 05/22/17 1059 05/23/17 0620  NA 135 139 139 140 143  K 3.9 4.7 4.5 5.3* 3.5  CL 101 105 109 110 111  CO2 24 20* 23 18* 25  GLUCOSE 101* 175* 304* 216* 162*  BUN 13 14 22* 25* 21*  CREATININE 0.90 0.94 0.92 0.76 0.74  CALCIUM 8.6* 8.7* 8.5* 8.7* 8.6*   GFR: Estimated Creatinine Clearance: 43 mL/min (by C-G formula based on SCr of 0.74 mg/dL). Liver Function Tests: Recent Labs  Lab 05/19/17 1225  AST 32  ALT 26  ALKPHOS 85  BILITOT 1.0  PROT 5.6*  ALBUMIN 2.4*   No results for input(s): LIPASE, AMYLASE in the last 168 hours. No results for input(s): AMMONIA in the last 168 hours. Coagulation Profile: Recent Labs  Lab 05/19/17 1225  INR 1.92   Cardiac Enzymes: No results for input(s): CKTOTAL, CKMB, CKMBINDEX, TROPONINI in the last 168 hours. BNP (last 3 results) No results for input(s): PROBNP in the last 8760 hours. HbA1C: Recent Labs    05/21/17 0438  HGBA1C 5.1   CBG: Recent Labs  Lab 05/22/17 1123 05/22/17 1632 05/22/17 2039 05/23/17 0743 05/23/17 1129  GLUCAP 190* 135* 149* 120* 176*   Lipid Profile: No results for input(s): CHOL, HDL, LDLCALC, TRIG, CHOLHDL, LDLDIRECT in the last 72 hours. Thyroid Function Tests: No results for input(s): TSH, T4TOTAL, FREET4, T3FREE, THYROIDAB in the last 72 hours. Anemia Panel: No results for  input(s): VITAMINB12, FOLATE, FERRITIN, TIBC, IRON, RETICCTPCT in the last 72 hours. Urine analysis:    Component Value Date/Time   COLORURINE YELLOW 05/19/2017 1205   APPEARANCEUR HAZY (A) 05/19/2017 1205   APPEARANCEUR Clear 05/07/2013 1436   LABSPEC 1.025 05/19/2017 1205   PHURINE 5.0 05/19/2017 1205   GLUCOSEU NEGATIVE 05/19/2017 1205   HGBUR MODERATE (A) 05/19/2017 1205   BILIRUBINUR NEGATIVE 05/19/2017 1205   BILIRUBINUR Neg 12/11/2013 1345   BILIRUBINUR Negative 05/07/2013 1436   KETONESUR 5 (A) 05/19/2017 1205   PROTEINUR NEGATIVE 05/19/2017 1205   UROBILINOGEN 0.2 12/11/2013 1345   UROBILINOGEN 1.0 11/16/2013 1256   NITRITE NEGATIVE 05/19/2017 1205   LEUKOCYTESUR TRACE (A) 05/19/2017 1205   LEUKOCYTESUR Negative 05/07/2013 1436     Ripudeep Rai M.D. [facility] 05/23/2017, 12:10 PM  Pager: [phone] Between 7am to 7pm - call Pager - [phone]  After 7pm go to [email or web address] - password [email or web address]  Call night coverage person covering after 7pm

## 2017-05-23 NOTE — Care Management Important Message (Signed)
Important Message  Patient Details  Name: [name] MRN: [id number] Date of Birth: 03/26/1924   Medicare Important Message Given:  No  Contact precaution in place  Orbie Pyo 05/23/2017, 3:50 PM

## 2017-05-23 NOTE — Clinical Social Work Note (Signed)
Clinical Social Work Assessment  Patient Details  Name: [name] MRN: [id number] Date of Birth: January 23, 1925  Date of referral:  05/23/17               Reason for consult:  Discharge Planning                Permission sought to share information with:  Family Supports Permission granted to share information::  No - Patient is oriented to self only.               Name::     [facility]   Agency::     Relationship::  Designer, fashion/clothing Information:  [phone]  Housing/Transportation Living arrangements for the past 2 months:  [place] of Information:  Adult Children Patient Interpreter Needed:  None Criminal Activity/Legal Involvement Pertinent to Current Situation/Hospitalization:  No - Comment as needed Significant Relationships:  Other Family Members Lives with:  Facility Resident Do you feel safe going back to the place where you live?  Yes Need for family participation in patient care:      Care giving concerns: The patient's granddaughter [name] asked about the results of Ms. Enke blood cultures; nurse was informed and given [name]'s number.  [name] was informed by the nurse that patient is not medically stable for discharge today.  Social Worker assessment / plan:  CSW intern spoke with [name] on the phone regarding [name] discharge disposition. [name] confirmed that patient is from [facility] and has been there since 04/06/2017. [name] also informed CSW intern that Ms. Baskins is transitioning from short term rehab to long term care at [facility].   Employment status:  Retired Forensic scientist:  Medicare PT Recommendations:   Patient has not been evaluated by physical therapy.   Information / Referral to community resources:   No information needed or requested as patient is returning to [facility].   Patient/Family's Response to care:  Family expressed no concerns regarding care during  hospitalization.   Patient/Family's Understanding of and Emotional Response to Diagnosis, Current Treatment, and Prognosis: [name] is understanding of patient's need for long term rehab and has confirmed that [name] will return to [facility] upon discharge.   Emotional Assessment Appearance:  Appears stated age Attitude/Demeanor/Rapport:  Unable to Assess(Patient is oriented to self only.) Affect (typically observed):  Unable to Assess(Patient is oriented to self only. ) Orientation:  Oriented to Self Alcohol / Substance use:  Never Used Psych involvement (Current and /or in the community):  No (Comment)  Discharge Needs  Concerns to be addressed:  No discharge needs identified Readmission within the last 30 days:  No Current discharge risk:  None Barriers to Discharge:  No Barriers Identified   Ernesto Rutherford, Student-Social Work 05/23/2017, 12:31 PM

## 2017-05-24 DIAGNOSIS — D649 Anemia, unspecified: Secondary | ICD-10-CM

## 2017-05-24 DIAGNOSIS — D696 Thrombocytopenia, unspecified: Secondary | ICD-10-CM

## 2017-05-24 DIAGNOSIS — R401 Stupor: Secondary | ICD-10-CM

## 2017-05-24 LAB — GLUCOSE, CAPILLARY
GLUCOSE-CAPILLARY: 150 mg/dL — AB (ref 65–99)
Glucose-Capillary: 188 mg/dL — ABNORMAL HIGH (ref 65–99)
Glucose-Capillary: 284 mg/dL — ABNORMAL HIGH (ref 65–99)
Glucose-Capillary: 197 mg/dL — ABNORMAL HIGH (ref 65–99)

## 2017-05-24 NOTE — Progress Notes (Signed)
PROGRESS NOTE  RONNETTE RUMP HEN:[id number] DOB: 1924-06-09 DOA: 05/19/2017 PCP: Patient, No Pcp Per  HPI/Recap of past 24 hours: Patient is a 84 year old female with HTN, A. fib, chronic diastolic CHF, DM type II with neuropathy, COPD, temporal arteritis, PMR, CKD, ITP, DVT on Eliquis, and osteoporosis;who presented for change in mental statusnoted over the last day.At baseline patient is noted to be conversant and over the last 24 hours have been more lethargic and less responsive. Patient was hospitalized from1/7-1/12after suffering a fall with right femur fracture undergoing ORIFby Dr. Malvin Johns postop complication of acute rightDVT with subsequent increased of Eliquis dose. Patient was readmitted to the hospital from 1/25-1/31suffering thought secondary to UTI treated initially with Rocephin then sent home initially on Keflex which she completed. Review of records shows that patient was subsequently started on Augmentin on 3/7 and then switched to Zosyn. In ED, temp of 100.6, BP low in 80s, heart rate up to 105, respirations 18-24, lactic acid 1.34, influenza negative chest x-ray clear.  Patient was admitted for sepsis with unclear etiology.  05/24/2017: Patient seen and examined at her bedside.  She denies any subjective fevers or chills. She has no new complaints.    Assessment/Plan: Principal Problem:   Sepsis ([place]) Active Problems:   Essential hypertension   Polymyalgia rheumatica (HCC)   Macrocytic anemia   AF (paroxysmal atrial fibrillation) (HCC)   Type 2 diabetes mellitus with renal manifestations ([place])  Sepsis with unclear source of infection IV vancomycin and IV Zosyn have been discontinued.  There was started empirically. Catheter tip culture showed 8000 colonies of coagulase-negative staph. Blood cultures done on 05/23/2017 saw in process Awaiting results of those blood culture Continue to be hypothermic Continue bear hugger Progress Less than  3.61  Acute metabolic encephalopathy with unclear etiology possibly secondary to clear source of infection versus dehydration Patient is currently alert and oriented x2 and close to her baseline CT head negative for any acute intracranial findings  Hypertension Blood pressure stable Continue metoprolol  Chronic diastolic congestive heart failure Does not appear to be in acute exacerbation Continue metoprolol Last 2D echo in 2012 revealed LVEF of 65-70% with grade 1 diastolic dysfunction  Paroxysmal A. fib On Eliquis  Chronic macrocytic anemia Folate and B12 within normal limits hemoglobin stable  Chronic pancytopenia stable  Code Status: DNR.  Family Communication: None at bedside.  Disposition Plan: To SNF when hemodynamically stable with results of last blood cultures.   Consultants:  None.  Procedures:  None.  Antimicrobials:  None.  DVT prophylaxis: SCSCDs.Ds.   Objective: Vitals:   05/23/17 1828 05/23/17 2203 05/24/17 0511 05/24/17 1016  BP:  120/73 125/80 132/90  Pulse:  80 75 80  Resp:  16 17 18   Temp: (!) 97.3 F (36.3 C) 98.1 F (36.7 C) 98.4 F (36.9 C) 98.2 F (36.8 C)  TempSrc: Oral Oral Oral Oral  SpO2:  100% 100% 100%  Weight:      Height:        Intake/Output Summary (Last 24 hours) at 05/24/2017 1547 Last data filed at 05/24/2017 1400 Gross per 24 hour  Intake 420 ml  Output 1175 ml  Net -755 ml   Filed Weights   05/21/17 2041 05/22/17 0256 05/22/17 2049  Weight: 74.9 kg (165 lb 2 oz) 74.9 kg (165 lb 2 oz) 74 kg (163 lb 2.3 oz)    Exam:   General: 84 year old Caucasian female well-developed well-nourished in no acute distress.  Cardiovascular: Regular rate and rhythm  with no murmurs or gallops.  Respiratory: Clear to auscultation with no wheezes or rales.  Abdomen: Soft nontender nondistended normal bowel sounds x4.  Musculoskeletal: Trace edema in lower extremities bilaterally.  Psychiatry: Mood is appropriate for  condition and setting.   Data Reviewed: CBC: Recent Labs  Lab 05/19/17 1225 05/20/17 0412 05/20/17 0413 05/21/17 0438 05/22/17 1059 05/23/17 0620  WBC 9.5  --  5.9 8.7 10.1 8.5  NEUTROABS 5.7  --   --   --   --   --   HGB 11.4*  --  11.9* 10.3* 12.3 11.0*  HCT 36.2 35.3 37.8 32.1* 39.5 35.8*  MCV 102.0*  --  103.0* 101.3* 102.6* 104.1*  PLT 125*  --  120* 136* 138* 250*   Basic Metabolic Panel: Recent Labs  Lab 05/19/17 1225 05/20/17 0413 05/21/17 0438 05/22/17 1059 05/23/17 0620  NA 135 139 139 140 143  K 3.9 4.7 4.5 5.3* 3.5  CL 101 105 109 110 111  CO2 24 20* 23 18* 25  GLUCOSE 101* 175* 304* 216* 162*  BUN 13 14 22* 25* 21*  CREATININE 0.90 0.94 0.92 0.76 0.74  CALCIUM 8.6* 8.7* 8.5* 8.7* 8.6*   GFR: Estimated Creatinine Clearance: 43 mL/min (by C-G formula based on SCr of 0.74 mg/dL). Liver Function Tests: Recent Labs  Lab 05/19/17 1225  AST 32  ALT 26  ALKPHOS 85  BILITOT 1.0  PROT 5.6*  ALBUMIN 2.4*   No results for input(s): LIPASE, AMYLASE in the last 168 hours. No results for input(s): AMMONIA in the last 168 hours. Coagulation Profile: Recent Labs  Lab 05/19/17 1225  INR 1.92   Cardiac Enzymes: No results for input(s): CKTOTAL, CKMB, CKMBINDEX, TROPONINI in the last 168 hours. BNP (last 3 results) No results for input(s): PROBNP in the last 8760 hours. HbA1C: No results for input(s): HGBA1C in the last 72 hours. CBG: Recent Labs  Lab 05/23/17 1129 05/23/17 1630 05/23/17 2200 05/24/17 0745 05/24/17 1157  GLUCAP 176* 128* 242* 150* 188*   Lipid Profile: No results for input(s): CHOL, HDL, LDLCALC, TRIG, CHOLHDL, LDLDIRECT in the last 72 hours. Thyroid Function Tests: Recent Labs    05/23/17 1707  TSH 1.593   Anemia Panel: No results for input(s): VITAMINB12, FOLATE, FERRITIN, TIBC, IRON, RETICCTPCT in the last 72 hours. Urine analysis:    Component Value Date/Time   COLORURINE YELLOW 05/19/2017 1205   APPEARANCEUR HAZY  (A) 05/19/2017 1205   APPEARANCEUR Clear 05/07/2013 1436   LABSPEC 1.025 05/19/2017 1205   PHURINE 5.0 05/19/2017 1205   GLUCOSEU NEGATIVE 05/19/2017 1205   HGBUR MODERATE (A) 05/19/2017 1205   BILIRUBINUR NEGATIVE 05/19/2017 1205   BILIRUBINUR Neg 12/11/2013 1345   BILIRUBINUR Negative 05/07/2013 1436   KETONESUR 5 (A) 05/19/2017 1205   PROTEINUR NEGATIVE 05/19/2017 1205   UROBILINOGEN 0.2 12/11/2013 1345   UROBILINOGEN 1.0 11/16/2013 1256   NITRITE NEGATIVE 05/19/2017 1205   LEUKOCYTESUR TRACE (A) 05/19/2017 1205   LEUKOCYTESUR Negative 05/07/2013 1436   Sepsis Labs: @LABRCNTIP (procalcitonin:4,lacticidven:4)  ) Recent Results (from the past 240 hour(s))  Urine culture     Status: Abnormal   Collection Time: 05/19/17 12:10 PM  Result Value Ref Range Status   Specimen Description URINE, RANDOM  Final   Special Requests NONE  Final   Culture (A)  Final    <10,000 COLONIES/mL INSIGNIFICANT GROWTH Performed at [facility], [place] [address], [place], [place] [address]    Report Status 05/20/2017 FINAL  Final  MRSA  PCR Screening     Status: None   Collection Time: 05/20/17  2:12 AM  Result Value Ref Range Status   MRSA by PCR NEGATIVE NEGATIVE Final    Comment:        The GeneXpert MRSA Assay (FDA approved for NASAL specimens only), is one component of a comprehensive MRSA colonization surveillance program. It is not intended to diagnose MRSA infection nor to guide or monitor treatment for MRSA infections. Performed at [facility], [place] [address] [place], [place] [address]   Culture, blood (routine x 2)     Status: None (Preliminary result)   Collection Time: 05/21/17  9:18 AM  Result Value Ref Range Status   Specimen Description BLOOD RIGHT ANTECUBITAL  Final   Special Requests   Final    BOTTLES DRAWN AEROBIC AND ANAEROBIC Blood Culture adequate volume   Culture   Final    NO GROWTH 3 DAYS Performed at [facility], [place] [address],  [place], [place] [address]    Report Status PENDING  Incomplete  Culture, blood (routine x 2)     Status: None (Preliminary result)   Collection Time: 05/21/17  9:25 AM  Result Value Ref Range Status   Specimen Description BLOOD RIGHT ARM  Final   Special Requests   Final    BOTTLES DRAWN AEROBIC AND ANAEROBIC Blood Culture adequate volume   Culture   Final    NO GROWTH 3 DAYS Performed at [facility], [address], [place], [place] [address]    Report Status PENDING  Incomplete  Cath Tip Culture     Status: Abnormal   Collection Time: 05/21/17  9:32 AM  Result Value Ref Range Status   Specimen Description ARM LEFT URINE, CATHETERIZED CENTRAL LINE  Final   Special Requests   Final    NONE Performed at [facility], [address] [place], [place] [address]    Culture (A)  Final    8,000 COLONIES/mL STAPHYLOCOCCUS SPECIES (COAGULASE NEGATIVE)   Report Status 05/23/2017 FINAL  Final  Culture, blood (routine x 2)     Status: None (Preliminary result)   Collection Time: 05/23/17  5:00 PM  Result Value Ref Range Status   Specimen Description BLOOD LEFT ANTECUBITAL  Final   Special Requests   Final    BOTTLES DRAWN AEROBIC AND ANAEROBIC Blood Culture adequate volume   Culture   Final    NO GROWTH < 24 HOURS Performed at [facility], [place] [address], [place], [place] [address]    Report Status PENDING  Incomplete  Culture, blood (routine x 2)     Status: None (Preliminary result)   Collection Time: 05/23/17  5:20 PM  Result Value Ref Range Status   Specimen Description BLOOD BLOOD RIGHT FOREARM  Final   Special Requests   Final    BOTTLES DRAWN AEROBIC AND ANAEROBIC Blood Culture adequate volume   Culture   Final    NO GROWTH < 24 HOURS Performed at [facility], [place] [address], [place],  [address]    Report Status PENDING  Incomplete      Studies: No results found.  Scheduled Meds: . apixaban  5 mg Oral BID  . collagenase  1 application  Topical Daily  . famotidine  20 mg Oral Daily  . feeding supplement (GLUCERNA SHAKE)  237 mL Oral BID BM  . ferrous sulfate  325 mg Oral BID WC  .  gabapentin  300 mg Oral Daily  . gabapentin  600 mg Oral QHS  . glimepiride  2 mg Oral Q breakfast  . insulin aspart  0-15 Units Subcutaneous TID WC  . insulin aspart  0-5 Units Subcutaneous QHS  . insulin aspart  6 Units Subcutaneous TID WC  . metoprolol tartrate  12.5 mg Oral BID  . mirtazapine  15 mg Oral QHS  . predniSONE  20 mg Oral Q breakfast  . sodium chloride flush  10-40 mL Intracatheter Q12H    Continuous Infusions:   LOS: 5 days     Kayleen Memos, MD [facility] Pager [phone]  If 7PM-7AM, please contact night-coverage [email or web address] Password [facility] 05/24/2017, 3:47 PM

## 2017-05-24 NOTE — Discharge Instructions (Addendum)
Fever, Adult A fever is an increase in the bodys temperature. It is often defined as a temperature of 100 F (38C) or higher. Short mild or moderate fevers often have no long-term effects. They also often do not need treatment. Moderate or high fevers may make you feel uncomfortable. Sometimes, they can also be a sign of a serious illness or disease. The sweating that may happen with repeated fevers or fevers that last a while may also cause you to not have enough fluid in your body (dehydration). You can take your temperature with a thermometer to see if you have a fever. A measured temperature can change with:  Age.  Time of day.  Where the thermometer is placed: ? Mouth (oral). ? Rectum (rectal). ? Ear (tympanic). ? Underarm (axillary). ? Forehead (temporal).  Follow these instructions at home: Pay attention to any changes in your symptoms. Take these actions to help with your condition:  Take over-the-counter and prescription medicines only as told by your doctor. Follow the dosing instructions carefully.  If you were prescribed an antibiotic medicine, take it as told by your doctor. Do not stop taking the antibiotic even if you start to feel better.  Rest as needed.  Drink enough fluid to keep your pee (urine) clear or pale yellow.  Sponge yourself or bathe with room-temperature water as needed. This helps to lower your body temperature . Do not use ice water.  Do not wear too many blankets or heavy clothes.  Contact a doctor if:  You throw up (vomit).  You cannot eat or drink without throwing up.  You have watery poop (diarrhea).  It hurts when you pee.  Your symptoms do not get better with treatment.  You have new symptoms.  You feel very weak. Get help right away if:  You are short of breath or have trouble breathing.  You are dizzy or you pass out (faint).  You feel confused.  You have signs of not having enough fluid in your body, such as: ? A dry  mouth. ? Peeing less. ? Looking pale.  You have very bad pain in your belly (abdomen).  You keep throwing up or having water poop.  You have a skin rash.  Your symptoms suddenly get worse. This information is not intended to replace advice given to you by your health care provider. Make sure you discuss any questions you have with your health care provider. Document Released: 12/01/2007 Document Revised: 07/30/2015 Document Reviewed: 04/17/2014 Elsevier Interactive Patient Education  2018 [place] on my medicine - ELIQUIS (apixaban)  Why was Eliquis prescribed for you? Eliquis was prescribed for you to reduce the risk of a blood clot forming that can cause a stroke if you have a medical condition called atrial fibrillation (a type of irregular heartbeat).  What do You need to know about Eliquis ? Take your Eliquis TWICE DAILY - one tablet in the morning and one tablet in the evening with or without food. If you have difficulty swallowing the tablet whole please discuss with your pharmacist how to take the medication safely.  Take Eliquis exactly as prescribed by your doctor and DO NOT stop taking Eliquis without talking to the doctor who prescribed the medication.  Stopping may increase your risk of developing a stroke.  Refill your prescription before you run out.  After discharge, you should have regular check-up appointments with your healthcare provider that is prescribing your Eliquis.  In the future your dose may  need to be changed if your kidney function or weight changes by a significant amount or as you get older.  What do you do if you miss a dose? If you miss a dose, take it as soon as you remember on the same day and resume taking twice daily.  Do not take more than one dose of ELIQUIS at the same time to make up a missed dose.  Important Safety Information A possible side effect of Eliquis is bleeding. You should call your healthcare provider  right away if you experience any of the following: ? Bleeding from an injury or your nose that does not stop. ? Unusual colored urine (red or dark brown) or unusual colored stools (red or black). ? Unusual bruising for unknown reasons. ? A serious fall or if you hit your head (even if there is no bleeding).  Some medicines may interact with Eliquis and might increase your risk of bleeding or clotting while on Eliquis. To help avoid this, consult your healthcare provider or pharmacist prior to using any new prescription or non-prescription medications, including herbals, vitamins, non-steroidal anti-inflammatory drugs (NSAIDs) and supplements.  This website has more information on Eliquis (apixaban): http://www.eliquis.com/eliquis/home

## 2017-05-25 LAB — CBC
HEMATOCRIT: 38 % (ref 36.0–46.0)
HEMOGLOBIN: 11.8 g/dL — AB (ref 12.0–15.0)
MCH: 32.1 pg (ref 26.0–34.0)
MCHC: 31.1 g/dL (ref 30.0–36.0)
MCV: 103.3 fL — AB (ref 78.0–100.0)
PLATELETS: 128 10*3/uL — AB (ref 150–400)
RBC: 3.68 MIL/uL — AB (ref 3.87–5.11)
RDW: 16 % — ABNORMAL HIGH (ref 11.5–15.5)
WBC: 8.6 10*3/uL (ref 4.0–10.5)

## 2017-05-25 LAB — BASIC METABOLIC PANEL
ANION GAP: 6 (ref 5–15)
BUN: 19 mg/dL (ref 6–20)
CHLORIDE: 110 mmol/L (ref 101–111)
CO2: 27 mmol/L (ref 22–32)
Calcium: 8.7 mg/dL — ABNORMAL LOW (ref 8.9–10.3)
Creatinine, Ser: 0.72 mg/dL (ref 0.44–1.00)
GFR calc Af Amer: >60 mL/min (ref >60)
GFR calc non Af Amer: >60 mL/min (ref >60)
GLUCOSE: 90 mg/dL (ref 65–99)
POTASSIUM: 3.2 mmol/L — AB (ref 3.5–5.1)
Sodium: 143 mmol/L (ref 135–145)

## 2017-05-25 LAB — GLUCOSE, CAPILLARY
GLUCOSE-CAPILLARY: 76 mg/dL (ref 65–99)
GLUCOSE-CAPILLARY: 281 mg/dL — AB (ref 65–99)
Glucose-Capillary: 150 mg/dL — ABNORMAL HIGH (ref 65–99)
Glucose-Capillary: 222 mg/dL — ABNORMAL HIGH (ref 65–99)

## 2017-05-25 NOTE — Progress Notes (Addendum)
Inpatient Diabetes Program Recommendations  AACE/ADA: New Consensus Statement on Inpatient Glycemic Control (2015)  Target Ranges:  Prepandial:   less than 140 mg/dL      Peak postprandial:   less than 180 mg/dL (1-2 hours)      Critically ill patients:  140 - 180 mg/dL   Lab Results  Component Value Date   GLUCAP 76 05/25/2017   HGBA1C 5.1 05/21/2017    Review of Glycemic Control Results for COREN, CROWNOVER (MRN [id number]) as of 05/25/2017 10:34  Ref. Range 05/24/2017 11:57 05/24/2017 17:11 05/24/2017 22:08 05/25/2017 07:25  Glucose-Capillary Latest Ref Range: 65 - 99 mg/dL 188 (H) 284 (H) 197 (H) 76    Diabetes history: Type 2 DM Outpatient Diabetes medications: Amaryl 2 mg in AM, Novolog SSI TIDAC, Tradjenta 5 mg QD. Current: Amaryl 2 mg in AM, Novolog 6 units TIDAC, Novolog 0-5 units QHS, Novolog 0-15 units TID. Prednisone 20 mg in AM.    Inpatient Diabetes Program Recommendations:    Of mention: A1C is 5.1%, indicating good control, however, concern for lows given patient is on a Sulfonylurea and prior to admin of med A1C was 6.6%.   Would recommend discontinuing Amaryl 2 mg in AM.   Additionally, would change Novolog 6 units TIDAC to the the glycemic control order set, so that order will ONLY be given when patient consumes >50% and has minimum BS of >80 mg/dL. Patient at risk for lows with the way current order is written.  Adjust insulin, especially meal coverage as steroids are tapered.   Thanks, Bronson Curb, MSN, RNC-OB Diabetes Coordinator [phone] (8a-5p)

## 2017-05-25 NOTE — Progress Notes (Signed)
PROGRESS NOTE  [name] UYQ:[id number] DOB: [birth year] DOA: 05/19/2017 PCP: Patient, No Pcp Per  HPI/Recap of past 24 hours: Patient is a 84 year old female with HTN, A. fib, chronic diastolic CHF, DM type II with neuropathy, COPD, temporal arteritis, PMR, CKD, ITP, DVT on Eliquis, and osteoporosis;who presented for change in mental statusnoted over the last day.At baseline patient is noted to be conversant and over the last 24 hours have been more lethargic and less responsive. Patient was hospitalized from1/7-1/12after suffering a fall with right femur fracture undergoing ORIFby Dr. Malvin Johns postop complication of acute rightDVT with subsequent increased of Eliquis dose. Patient was readmitted to the hospital from 1/25-1/31suffering thought secondary to UTI treated initially with Rocephin then sent home initially on Keflex which she completed. Review of records shows that patient was subsequently started on Augmentin on 3/7 and then switched to Zosyn. In ED, temp of 100.6, BP low in 80s, heart rate up to 105, respirations 18-24, lactic acid 1.34, influenza negative chest x-ray clear.  Patient was admitted for sepsis with unclear etiology.  05/24/2017: Patient seen and examined at her bedside.  She denies any subjective fevers or chills. She has no new complaints.  05/25/17: no new complaints. Denies chills or subjective fever.    Assessment/Plan: Principal Problem:   Sepsis ([place]) Active Problems:   Essential hypertension   Polymyalgia rheumatica (HCC)   Macrocytic anemia   AF (paroxysmal atrial fibrillation) (HCC)   Type 2 diabetes mellitus with renal manifestations ([place])  Sepsis with unclear source of infection IV vancomycin and IV Zosyn have been discontinued.  There was started empirically. Catheter tip culture showed 8000 colonies of coagulase-negative staph. Blood cultures done on 05/23/2017 saw in process Awaiting results of those blood culture Continue to be  hypothermic Continue bear hugger Progress Less than 6.38  Acute metabolic encephalopathy with unclear etiology possibly secondary to clear source of infection versus dehydration Patient is currently alert and oriented x2 and close to her baseline CT head negative for any acute intracranial findings  Hypertension Blood pressure stable Continue metoprolol  Chronic diastolic congestive heart failure Does not appear to be in acute exacerbation Continue metoprolol Last 2D echo in 2012 revealed LVEF of 65-70% with grade 1 diastolic dysfunction  Paroxysmal A. fib On Eliquis  Chronic macrocytic anemia Folate and B12 within normal limits hemoglobin stable  Chronic pancytopenia stable  Code Status: DNR.  Family Communication: None at bedside.  Disposition Plan: To SNF when hemodynamically stable with results of last blood cultures.   Consultants:  None.  Procedures:  None.  Antimicrobials:  None.  DVT prophylaxis: SCDs.   Objective: Vitals:   05/24/17 2101 05/25/17 0502 05/25/17 1000 05/25/17 1713  BP: 127/71 (!) 146/88 (!) 149/90 133/81  Pulse: 77 78 78 66  Resp: 19 20 20 18   Temp: (!) 97.4 F (36.3 C) 98.6 F (37 C) 98 F (36.7 C) 98.2 F (36.8 C)  TempSrc: Oral Axillary Oral Oral  SpO2: 97% 100% 100% 98%  Weight:      Height:        Intake/Output Summary (Last 24 hours) at 05/25/2017 1916 Last data filed at 05/25/2017 1335 Gross per 24 hour  Intake 440 ml  Output 825 ml  Net -385 ml   Filed Weights   05/21/17 2041 05/22/17 0256 05/22/17 2049  Weight: 74.9 kg (165 lb 2 oz) 74.9 kg (165 lb 2 oz) 74 kg (163 lb 2.3 oz)    Exam:05/25/17   General: 84 yo CF  WD WN NAD   Cardiovascular: Regular rate and rhythm with no murmurs or gallops.  Respiratory: Clear to auscultation with no wheezes or rales.  Abdomen: Soft nontender nondistended normal bowel sounds x4.  Musculoskeletal: Trace edema in LE b/l  Psychiatry: Mood is appropriate for condition  and setting.   Data Reviewed: CBC: Recent Labs  Lab 05/19/17 1225  05/20/17 0413 05/21/17 0438 05/22/17 1059 05/23/17 0620 05/25/17 0812  WBC 9.5  --  5.9 8.7 10.1 8.5 8.6  NEUTROABS 5.7  --   --   --   --   --   --   HGB 11.4*  --  11.9* 10.3* 12.3 11.0* 11.8*  HCT 36.2   < > 37.8 32.1* 39.5 35.8* 38.0  MCV 102.0*  --  103.0* 101.3* 102.6* 104.1* 103.3*  PLT 125*  --  120* 136* 138* 123* 128*   < > = values in this interval not displayed.   Basic Metabolic Panel: Recent Labs  Lab 05/20/17 0413 05/21/17 0438 05/22/17 1059 05/23/17 0620 05/25/17 0812  NA 139 139 140 143 143  K 4.7 4.5 5.3* 3.5 3.2*  CL 105 109 110 111 110  CO2 20* 23 18* 25 27  GLUCOSE 175* 304* 216* 162* 90  BUN 14 22* 25* 21* 19  CREATININE 0.94 0.92 0.76 0.74 0.72  CALCIUM 8.7* 8.5* 8.7* 8.6* 8.7*   GFR: Estimated Creatinine Clearance: 43 mL/min (by C-G formula based on SCr of 0.72 mg/dL). Liver Function Tests: Recent Labs  Lab 05/19/17 1225  AST 32  ALT 26  ALKPHOS 85  BILITOT 1.0  PROT 5.6*  ALBUMIN 2.4*   No results for input(s): LIPASE, AMYLASE in the last 168 hours. No results for input(s): AMMONIA in the last 168 hours. Coagulation Profile: Recent Labs  Lab 05/19/17 1225  INR 1.92   Cardiac Enzymes: No results for input(s): CKTOTAL, CKMB, CKMBINDEX, TROPONINI in the last 168 hours. BNP (last 3 results) No results for input(s): PROBNP in the last 8760 hours. HbA1C: No results for input(s): HGBA1C in the last 72 hours. CBG: Recent Labs  Lab 05/24/17 1711 05/24/17 2208 05/25/17 0725 05/25/17 1145 05/25/17 1636  GLUCAP 284* 197* 76 150* 281*   Lipid Profile: No results for input(s): CHOL, HDL, LDLCALC, TRIG, CHOLHDL, LDLDIRECT in the last 72 hours. Thyroid Function Tests: Recent Labs    05/23/17 1707  TSH 1.593   Anemia Panel: No results for input(s): VITAMINB12, FOLATE, FERRITIN, TIBC, IRON, RETICCTPCT in the last 72 hours. Urine analysis:    Component Value  Date/Time   COLORURINE YELLOW 05/19/2017 1205   APPEARANCEUR HAZY (A) 05/19/2017 1205   APPEARANCEUR Clear 05/07/2013 1436   LABSPEC 1.025 05/19/2017 1205   PHURINE 5.0 05/19/2017 1205   GLUCOSEU NEGATIVE 05/19/2017 1205   HGBUR MODERATE (A) 05/19/2017 1205   BILIRUBINUR NEGATIVE 05/19/2017 1205   BILIRUBINUR Neg 12/11/2013 1345   BILIRUBINUR Negative 05/07/2013 1436   KETONESUR 5 (A) 05/19/2017 1205   PROTEINUR NEGATIVE 05/19/2017 1205   UROBILINOGEN 0.2 12/11/2013 1345   UROBILINOGEN 1.0 11/16/2013 1256   NITRITE NEGATIVE 05/19/2017 1205   LEUKOCYTESUR TRACE (A) 05/19/2017 1205   LEUKOCYTESUR Negative 05/07/2013 1436   Sepsis Labs: @LABRCNTIP (procalcitonin:4,lacticidven:4)  ) Recent Results (from the past 240 hour(s))  Urine culture     Status: Abnormal   Collection Time: 05/19/17 12:10 PM  Result Value Ref Range Status   Specimen Description URINE, RANDOM  Final   Special Requests NONE  Final   Culture (A)  Final    <10,000 COLONIES/mL INSIGNIFICANT GROWTH Performed at [facility] [address], [place], [place] [address]    Report Status 05/20/2017 FINAL  Final  MRSA PCR Screening     Status: None   Collection Time: 05/20/17  2:12 AM  Result Value Ref Range Status   MRSA by PCR NEGATIVE NEGATIVE Final    Comment:        The GeneXpert MRSA Assay (FDA approved for NASAL specimens only), is one component of a comprehensive MRSA colonization surveillance program. It is not intended to diagnose MRSA infection nor to guide or monitor treatment for MRSA infections. Performed at [facility], [place] [address], [place], [place] [address]   Culture, blood (routine x 2)     Status: None (Preliminary result)   Collection Time: 05/21/17  9:18 AM  Result Value Ref Range Status   Specimen Description BLOOD RIGHT ANTECUBITAL  Final   Special Requests   Final    BOTTLES DRAWN AEROBIC AND ANAEROBIC Blood Culture adequate volume   Culture   Final    NO GROWTH 4  DAYS Performed at [facility], [place] [address], [place], [place] [address]    Report Status PENDING  Incomplete  Culture, blood (routine x 2)     Status: None (Preliminary result)   Collection Time: 05/21/17  9:25 AM  Result Value Ref Range Status   Specimen Description BLOOD RIGHT ARM  Final   Special Requests   Final    BOTTLES DRAWN AEROBIC AND ANAEROBIC Blood Culture adequate volume   Culture   Final    NO GROWTH 4 DAYS Performed at [facility], [address] [place], [place] [address]    Report Status PENDING  Incomplete  Cath Tip Culture     Status: Abnormal   Collection Time: 05/21/17  9:32 AM  Result Value Ref Range Status   Specimen Description ARM LEFT URINE, CATHETERIZED CENTRAL LINE  Final   Special Requests   Final    NONE Performed at [facility], [address] [place], [place] [address]    Culture (A)  Final    8,000 COLONIES/mL STAPHYLOCOCCUS SPECIES (COAGULASE NEGATIVE)   Report Status 05/23/2017 FINAL  Final  Culture, blood (routine x 2)     Status: None (Preliminary result)   Collection Time: 05/23/17  5:00 PM  Result Value Ref Range Status   Specimen Description BLOOD LEFT ANTECUBITAL  Final   Special Requests   Final    BOTTLES DRAWN AEROBIC AND ANAEROBIC Blood Culture adequate volume   Culture   Final    NO GROWTH 2 DAYS Performed at [facility], [place] [address], [place], [place] [address]    Report Status PENDING  Incomplete  Culture, blood (routine x 2)     Status: None (Preliminary result)   Collection Time: 05/23/17  5:20 PM  Result Value Ref Range Status   Specimen Description BLOOD BLOOD RIGHT FOREARM  Final   Special Requests   Final    BOTTLES DRAWN AEROBIC AND ANAEROBIC Blood Culture adequate volume   Culture   Final    NO GROWTH 2 DAYS Performed at [facility], [place] [address], [place], [place] [address]    Report Status PENDING  Incomplete      Studies: No results found.  Scheduled Meds: .  apixaban  5 mg Oral BID  . collagenase  1 application Topical Daily  .  famotidine  20 mg Oral Daily  . feeding supplement (GLUCERNA SHAKE)  237 mL Oral BID BM  . ferrous sulfate  325 mg Oral BID WC  . gabapentin  300 mg Oral Daily  . gabapentin  600 mg Oral QHS  . glimepiride  2 mg Oral Q breakfast  . insulin aspart  0-15 Units Subcutaneous TID WC  . insulin aspart  0-5 Units Subcutaneous QHS  . insulin aspart  6 Units Subcutaneous TID WC  . metoprolol tartrate  12.5 mg Oral BID  . mirtazapine  15 mg Oral QHS  . predniSONE  20 mg Oral Q breakfast  . sodium chloride flush  10-40 mL Intracatheter Q12H    Continuous Infusions:   LOS: 6 days     Kayleen Memos, MD [facility] Pager [phone]  If 7PM-7AM, please contact night-coverage [email or web address] Password [facility] 05/25/2017, 7:16 PM

## 2017-05-26 ENCOUNTER — Other Ambulatory Visit: Payer: Self-pay

## 2017-05-26 LAB — GLUCOSE, CAPILLARY
GLUCOSE-CAPILLARY: 139 mg/dL — AB (ref 65–99)
GLUCOSE-CAPILLARY: 266 mg/dL — AB (ref 65–99)
Glucose-Capillary: 133 mg/dL — ABNORMAL HIGH (ref 65–99)

## 2017-05-26 LAB — CULTURE, BLOOD (ROUTINE X 2)
CULTURE: NO GROWTH
Culture: NO GROWTH
SPECIAL REQUESTS: ADEQUATE
Special Requests: ADEQUATE

## 2017-05-26 MED ORDER — AMLODIPINE BESYLATE 5 MG PO TABS: 5.0000 mg | ORAL_TABLET | Freq: Every day | ORAL | Status: DC

## 2017-05-26 MED ORDER — AMLODIPINE BESYLATE 5 MG PO TABS
5.0000 mg | ORAL_TABLET | Freq: Every day | ORAL | Status: DC
  Administered 2017-05-26: 5 mg via ORAL
  Filled 2017-05-26: qty 1

## 2017-05-26 NOTE — Clinical Social Work Note (Signed)
Patient medically stable for discharge today and will be returning to [facility], where she is a long-term care resident. Facility notified and d/c clinicals transmitted. Eliseo Gum [phone]) contacted and advised of discharge. CSW signing off as no other SW intervention services needed at this time.  Lota Leamer Givens, MSW, LCSW Licensed Clinical Social Worker [place] [phone]

## 2017-05-26 NOTE — Progress Notes (Signed)
Patient d/c to [facility] via [facility]

## 2017-05-26 NOTE — Discharge Summary (Signed)
Discharge Summary  [name] CWC:[id number] DOB: [birth year]  PCP: Patient, No Pcp Per  Admit date: 05/19/2017 Discharge date: 05/26/2017  Time spent: 25 minutes  Recommendations for Outpatient Follow-up:  1. Follow with PCP posthospitalization 2. Take your medications as prescribed 3. Fall precautions  Discharge Diagnoses:  Active Hospital Problems   Diagnosis Date Noted  . Sepsis ([place]) 05/19/2017  . Type 2 diabetes mellitus with renal manifestations ([place]) 03/13/2017  . AF (paroxysmal atrial fibrillation) ([place]) 03/13/2017  . Macrocytic anemia 01/31/2011  . Polymyalgia rheumatica ([place]) 01/11/2011  . Essential hypertension 01/11/2011    Resolved Hospital Problems  No resolved problems to display.    Discharge Condition: Stable  Diet recommendation: Resume previous diet  Vitals:   05/26/17 0646 05/26/17 0900  BP: (!) 154/97 (!) 152/94  Pulse: 80 88  Resp:  18  Temp: (!) 97.4 F (36.3 C)   SpO2:  99%    History of present illness:  Patient is a 84 year old female with HTN, A. fib, chronic diastolic CHF, DM type II with neuropathy, COPD, temporal arteritis, PMR, CKD, ITP, DVT on Eliquis, and osteoporosis;who presented for change in mental statusnoted over the last day.At baseline patient is noted to be conversant and over the last 24 hours have been more lethargic and less responsive. Patient was hospitalized from1/7-1/12after suffering a fall with right femur fracture undergoing ORIFby Dr. Malvin Johns postop complication of acute rightDVT with subsequent increased of Eliquis dose. Patient was readmitted to the hospital from 1/25-1/31suffering thought secondary to UTI treated initially with Rocephin then sent home initially on Keflex which she completed. Review of records shows that patient was subsequently started on Augmentin on 3/7 and then switched to Zosyn. In ED, temp of 100.6, BP low in 80s, heart rate up to 105, respirations 18-24, lactic acid 1.34,  influenza negative chest x-ray clear. Patient was admitted for sepsis with unclear etiology.  05/25/2017: Patient seen and examined at her bedside.  She denies any subjective fevers or chills. She has no new complaints.  05/26/2017: Patient seen and examined at bedside.  She has no new complaints.  On the day of discharge patient was hemodynamically stable.  She will need continued with PCP post hospitalization.   Hospital Course:  Principal Problem:   Sepsis ([place]) Active Problems:   Essential hypertension   Polymyalgia rheumatica (H[facility])   Macrocytic anemia   AF (paroxysmal atrial fibrillation) (H[facility])   Type 2 diabetes mellitus with renal manifestations ([place])  Sepsis with unclear source of infection IV vancomycin and IV Zosyn have been discontinued. These were started empirically. Catheter tip culture showed 8000 colonies of coagulase-negative staph. Blood cultures done on 05/23/2017 No growth to date. Procalcitonin <7.61  Acute metabolic encephalopathy with unclear etiology possibly secondary to dehydration, resolved Patient is currently alert and oriented x2 and close to her baseline CT head negative for any acute intracranial findings  Hypertension Blood pressure stable Continue metoprolol  Chronic diastolic congestive heart failure Does not appear to be in acute exacerbation Continue metoprolol Last 2D echo in 2012 revealed LVEF of 65-70% with grade 1 diastolic dysfunction  Paroxysmal A. Fib Rate controlled on metoprolol On Eliquis for CVA prophylaxis  Chronic macrocytic anemia Folate and B12 within normal limits  hemoglobin stable  DM2 Tightly controlled Avoid hypoglycemia A1C 5.1 on 05/21/17  Procedures:  None  Consultations:  None  Discharge Exam: BP (!) 152/94 (BP Location: Left Arm)   Pulse 88   Temp (!) 97.4 F (36.3 C) (Oral)  Resp 18   Ht 5\' 1"  (1.549 m)   Wt 79.8 kg (176 lb)   SpO2 99%   BMI 33.25 kg/m   General: 84 year old  Caucasian female well-developed well-nourished in no acute distress.  Alert and oriented x2. Cardiovascular: Regular rate and rhythm with no rubs or gallops. Respiratory: Clear to auscultation with no wheezes or rales.  Discharge Instructions You were cared for by a hospitalist during your hospital stay. If you have any questions about your discharge medications or the care you received while you were in the hospital after you are discharged, you can call the unit and asked to speak with the hospitalist on call if the hospitalist that took care of you is not available. Once you are discharged, your primary care physician will handle any further medical issues. Please note that NO REFILLS for any discharge medications will be authorized once you are discharged, as it is imperative that you return to your primary care physician (or establish a relationship with a primary care physician if you do not have one) for your aftercare needs so that they can reassess your need for medications and monitor your lab values.   Allergies as of 05/26/2017      Reactions   Allegra [fexofenadine Hcl] Other (See Comments)   Unknown reaction   Levaquin [levofloxacin In D5w] Other (See Comments)   Unknown reaction   Tussionex Pennkinetic Er [hydrocod Polst-cpm Polst Er] Nausea And Vomiting   Codeine Nausea And Vomiting   Morphine And Related Nausea And Vomiting      Medication List    STOP taking these medications   cephALEXin 500 MG capsule Commonly known as:  KEFLEX   glimepiride 2 MG tablet Commonly known as:  AMARYL   linagliptin 5 MG Tabs tablet Commonly known as:  TRADJENTA   NOVOLOG 100 UNIT/ML injection Generic drug:  insulin aspart   piperacillin-tazobactam 3.375 (3-0.375) g injection Commonly known as:  ZOSYN   traMADol 50 MG tablet Commonly known as:  ULTRAM     TAKE these medications   acetaminophen 325 MG tablet Commonly known as:  TYLENOL Take 2 tablets (650 mg total) by mouth  every 6 (six) hours as needed for mild pain, moderate pain, fever or headache.   amLODipine 5 MG tablet Commonly known as:  NORVASC Take 1 tablet (5 mg total) by mouth daily.   apixaban 5 MG Tabs tablet Commonly known as:  ELIQUIS Take 1 tablet (5 mg total) by mouth 2 (two) times daily.   DECUBI-VITE PO Take 1 capsule by mouth daily.   feeding supplement (GLUCERNA SHAKE) Liqd Take 237 mLs by mouth 2 (two) times daily between meals.   ferrous sulfate 325 (65 FE) MG tablet Take 1 tablet (325 mg total) by mouth 2 (two) times daily with a meal.   gabapentin 300 MG capsule Commonly known as:  NEURONTIN Take 1 capsule (300 mg total) by mouth 2 (two) times daily. Taking 1 tab in the am , and 2 tabs in the pm. What changed:    how much to take  when to take this  additional instructions   metoprolol tartrate 25 MG tablet Commonly known as:  LOPRESSOR Take 0.5 tablets (12.5 mg total) by mouth 2 (two) times daily.   mirtazapine 15 MG tablet Commonly known as:  REMERON Take 15 mg by mouth at bedtime.   oxybutynin 5 MG tablet Commonly known as:  DITROPAN Take 1 tablet (5 mg total) by mouth 3 (three) times daily  as needed for bladder spasms (Up to 3 times daily to control bladder spasms.).   predniSONE 1 MG tablet Commonly known as:  DELTASONE Please start Prednisone 3mg  daily AFTER patient has completed the full weekly prednisone taper (20->15->10->5mg ) What changed:    medication strength  additional instructions   ranitidine 150 MG tablet Commonly known as:  ZANTAC Take 150 mg by mouth daily.   SANTYL ointment Generic drug:  collagenase Apply 1 application topically daily. Apply a dime-sized thickness amount to entire wound bed daily   senna 8.6 MG Tabs tablet Commonly known as:  SENOKOT Take 1 tablet by mouth daily as needed (constipation).      Allergies  Allergen Reactions  . Allegra [Fexofenadine Hcl] Other (See Comments)    Unknown reaction  . Levaquin  [Levofloxacin In D5w] Other (See Comments)    Unknown reaction  . Tussionex Pennkinetic Er [Hydrocod Polst-Cpm Polst Er] Nausea And Vomiting  . Codeine Nausea And Vomiting  . Morphine And Related Nausea And Vomiting   Follow-up Information    Hilty, Nadean Corwin, MD Follow up.   Specialty:  Cardiology Contact information: [address] [place] [address] [phone]            The results of significant diagnostics from this hospitalization (including imaging, microbiology, ancillary and laboratory) are listed below for reference.    Significant Diagnostic Studies: Ct Head Wo Contrast  Result Date: 05/19/2017 CLINICAL DATA:  Increased lethargy EXAM: CT HEAD WITHOUT CONTRAST TECHNIQUE: Contiguous axial images were obtained from the base of the skull through the vertex without intravenous contrast. COMPARISON:  03/31/2017 FINDINGS: Brain: Mild atrophic and chronic white matter ischemic changes are noted. No findings to suggest acute hemorrhage, acute infarction or space-occupying mass lesion are noted. Vascular: No hyperdense vessel or unexpected calcification. Skull: Normal. Negative for fracture or focal lesion. Sinuses/Orbits: Opacification of the left maxillary antrum is noted stable from the prior exam. Air-fluid level is noted within the sphenoid sinus on the right. Fluid is noted within the mastoid air cells on the left. Other: None. IMPRESSION: Chronic changes in the left maxillary antrum. Fluid within the right sphenoid sinus and left mastoid air cells. Atrophic and ischemic changes without acute abnormality. Electronically Signed   By: Inez Catalina M.D.   On: 05/19/2017 14:54   Dg Chest Port 1 View  Result Date: 05/19/2017 CLINICAL DATA:  Loss of consciousness. EXAM: PORTABLE CHEST 1 VIEW COMPARISON:  Chest x-ray dated March 31, 2017. FINDINGS: Stable borderline cardiomegaly. Normal pulmonary vascularity. Unchanged scarring/atelectasis at the left lung base. No  focal consolidation, pleural effusion, or pneumothorax. No acute osseous abnormality. IMPRESSION: No active disease. Electronically Signed   By: Titus Dubin M.D.   On: 05/19/2017 12:18    Microbiology: Recent Results (from the past 240 hour(s))  Urine culture     Status: Abnormal   Collection Time: 05/19/17 12:10 PM  Result Value Ref Range Status   Specimen Description URINE, RANDOM  Final   Special Requests NONE  Final   Culture (A)  Final    <10,000 COLONIES/mL INSIGNIFICANT GROWTH Performed at [facility], [address], [place], [place] [address]    Report Status 05/20/2017 FINAL  Final  MRSA PCR Screening     Status: None   Collection Time: 05/20/17  2:12 AM  Result Value Ref Range Status   MRSA by PCR NEGATIVE NEGATIVE Final    Comment:        The GeneXpert MRSA  Assay (FDA approved for NASAL specimens only), is one component of a comprehensive MRSA colonization surveillance program. It is not intended to diagnose MRSA infection nor to guide or monitor treatment for MRSA infections. Performed at [facility], [place] [address], [place], [place] [address]   Culture, blood (routine x 2)     Status: None   Collection Time: 05/21/17  9:18 AM  Result Value Ref Range Status   Specimen Description BLOOD RIGHT ANTECUBITAL  Final   Special Requests   Final    BOTTLES DRAWN AEROBIC AND ANAEROBIC Blood Culture adequate volume   Culture   Final    NO GROWTH 5 DAYS Performed at [facility], [place] [address], [place], [place] [address]    Report Status 05/26/2017 FINAL  Final  Culture, blood (routine x 2)     Status: None   Collection Time: 05/21/17  9:25 AM  Result Value Ref Range Status   Specimen Description BLOOD RIGHT ARM  Final   Special Requests   Final    BOTTLES DRAWN AEROBIC AND ANAEROBIC Blood Culture adequate volume   Culture   Final    NO GROWTH 5 DAYS Performed at [facility], [place] [address] [place], [place] [address]    Report  Status 05/26/2017 FINAL  Final  Cath Tip Culture     Status: Abnormal   Collection Time: 05/21/17  9:32 AM  Result Value Ref Range Status   Specimen Description ARM LEFT URINE, CATHETERIZED CENTRAL LINE  Final   Special Requests   Final    NONE Performed at [facility], [place] [address] [place], [place] [address]    Culture (A)  Final    8,000 COLONIES/mL STAPHYLOCOCCUS SPECIES (COAGULASE NEGATIVE)   Report Status 05/23/2017 FINAL  Final  Culture, blood (routine x 2)     Status: None (Preliminary result)   Collection Time: 05/23/17  5:00 PM  Result Value Ref Range Status   Specimen Description BLOOD LEFT ANTECUBITAL  Final   Special Requests   Final    BOTTLES DRAWN AEROBIC AND ANAEROBIC Blood Culture adequate volume   Culture   Final    NO GROWTH 3 DAYS Performed at [facility], [place] [address] [place], [place]  [address]    Report Status PENDING  Incomplete  Culture, blood (routine x 2)     Status: None (Preliminary result)   Collection Time: 05/23/17  5:20 PM  Result Value Ref Range Status   Specimen Description BLOOD BLOOD RIGHT FOREARM  Final   Special Requests   Final    BOTTLES DRAWN AEROBIC AND ANAEROBIC Blood Culture adequate volume   Culture   Final    NO GROWTH 3 DAYS Performed at [facility], [place] [address] [place], [place] [address]    Report Status PENDING  Incomplete     Labs: Basic Metabolic Panel: Recent Labs  Lab 05/20/17 0413 05/21/17 0438 05/22/17 1059 05/23/17 0620 05/25/17 0812  NA 139 139 140 143 143  K 4.7 4.5 5.3* 3.5 3.2*  CL 105 109 110 111 110  CO2 20* 23 18* 25 27  GLUCOSE 175* 304* 216* 162* 90  BUN 14 22* 25* 21* 19  CREATININE 0.94 0.92 0.76 0.74 0.72  CALCIUM 8.7* 8.5* 8.7* 8.6* 8.7*   Liver Function Tests: No results for input(s): AST, ALT, ALKPHOS, BILITOT, PROT, ALBUMIN in the last 168 hours. No results for input(s): LIPASE, AMYLASE in the last 168  hours. No results for input(s): AMMONIA in the last 168  hours. CBC: Recent Labs  Lab 05/20/17 0413 05/21/17 0438 05/22/17 1059 05/23/17 0620 05/25/17 0812  WBC 5.9 8.7 10.1 8.5 8.6  HGB 11.9* 10.3* 12.3 11.0* 11.8*  HCT 37.8 32.1* 39.5 35.8* 38.0  MCV 103.0* 101.3* 102.6* 104.1* 103.3*  PLT 120* 136* 138* 123* 128*   Cardiac Enzymes: No results for input(s): CKTOTAL, CKMB, CKMBINDEX, TROPONINI in the last 168 hours. BNP: BNP (last 3 results) Recent Labs    10/30/16 2002 02/10/17 1052 05/19/17 1225  BNP 395.0* 281.3* 284.9*    ProBNP (last 3 results) No results for input(s): PROBNP in the last 8760 hours.  CBG: Recent Labs  Lab 05/25/17 1145 05/25/17 1636 05/25/17 2105 05/26/17 0748 05/26/17 1206  GLUCAP 150* 281* 222* 133* 139*       Signed:  Kayleen Memos, MD [facility] 05/26/2017, 3:17 PM

## 2017-05-26 NOTE — Evaluation (Signed)
Physical Therapy Evaluation Patient Details Name: [name] MRN: [id number] DOB: 1924-11-16 Today's Date: 05/26/2017   History of Present Illness  Pt is a 84 y/o female admitted from SNF secondary to sepsis and AMS. CT negative for acute abnormality. PMH includes HTN, a fib, polymalgia rheumatica, DM, osteoporosis, dCHF, COPD, CKD, DVT, temporal arteritis, and R hip ORIF.   Clinical Impression  Pt admitted secondary to problem above with deficits below. PTA, pt reports she was mostly bed bound, however, did report staff assisted pt into WC sometimes. Upon eval, presenting with weakness, decreased balance, and cognitive deficits. Required max A +2 for basic bed mobility. Feel pt is close to baseline and all further needs can be met at [facility]. Will sign off at this time. If needs change, please re-consult.     Follow Up Recommendations SNF;Supervision/Assistance - 24 hour    Equipment Recommendations  None recommended by PT    Recommendations for Other Services       Precautions / Restrictions Precautions Precautions: Fall Restrictions Weight Bearing Restrictions: No      Mobility  Bed Mobility Overal bed mobility: Needs Assistance Bed Mobility: Supine to Sit;Sit to Supine     Supine to sit: Max assist;+2 for physical assistance Sit to supine: Max assist;+2 for physical assistance   General bed mobility comments: Max A +2 for basic bed mobility. RN in room and assisted with transfer. Able to tolerate ther ex in sitting.   Transfers                 General transfer comment: NT  Ambulation/Gait                Stairs            Wheelchair Mobility    Modified Rankin (Stroke Patients Only)       Balance Overall balance assessment: Needs assistance Sitting-balance support: Bilateral upper extremity supported;Feet supported Sitting balance-Leahy Scale: Poor Sitting balance - Comments: Reliant on UE support and min guard for dynamic sitting tasks.                                       Pertinent Vitals/Pain Pain Assessment: Faces Faces Pain Scale: No hurt    Home Living Family/patient expects to be discharged to:: Skilled nursing facility                 Additional Comments: [facility] from [facility] placem    Prior Function Level of Independence: Needs assistance   Gait / Transfers Assistance Needed: Reports she was mostly bed bound, however, then reports staff would help her get into the WC. Reports she was not ambulating.   ADL's / Homemaking Assistance Needed: Required assist for ADLs from staff.         Hand Dominance        Extremity/Trunk Assessment   Upper Extremity Assessment Upper Extremity Assessment: Generalized weakness    Lower Extremity Assessment Lower Extremity Assessment: Generalized weakness    Cervical / Trunk Assessment Cervical / Trunk Assessment: Kyphotic  Communication   Communication: No difficulties  Cognition Arousal/Alertness: Awake/alert Behavior During Therapy: WFL for tasks assessed/performed Overall Cognitive Status: No family/caregiver present to determine baseline cognitive functioning                                 General Comments:  Pt with memory deficits, however, unsure if this is baseline.       General Comments      Exercises General Exercises - Lower Extremity Long Arc Quad: AROM;Both;10 reps;Seated(partial ROM in RLE ) Heel Slides: AROM;Both;10 reps(partial range ) Hip Flexion/Marching: (unable ) Toe Raises: AROM;Both;10 reps;Seated Heel Raises: AROM;Both;10 reps;Seated   Assessment/Plan    PT Assessment All further PT needs can be met in the next venue of care  PT Problem List         PT Treatment Interventions      PT Goals (Current goals can be found in the Care Plan section)  Acute Rehab PT Goals Patient Stated Goal: to go back to SNF  PT Goal Formulation: With patient Time For Goal Achievement: 05/26/17 Potential to  Achieve Goals: Fair    Frequency     Barriers to discharge        Co-evaluation               AM-PAC PT "6 Clicks" Daily Activity  Outcome Measure Difficulty turning over in bed (including adjusting bedclothes, sheets and blankets)?: Unable Difficulty moving from lying on back to sitting on the side of the bed? : Unable Difficulty sitting down on and standing up from a chair with arms (e.g., wheelchair, bedside commode, etc,.)?: Unable Help needed moving to and from a bed to chair (including a wheelchair)?: Total Help needed walking in hospital room?: Total Help needed climbing 3-5 steps with a railing? : Total 6 Click Score: 6    End of Session   Activity Tolerance: Patient tolerated treatment well Patient left: in bed;with call bell/phone within reach;with bed alarm set;with nursing/sitter in room Nurse Communication: Mobility status PT Visit Diagnosis: Difficulty in walking, not elsewhere classified (R26.2);Muscle weakness (generalized) (M62.81);Other abnormalities of gait and mobility (R26.89)    Time: 0045-9977 PT Time Calculation (min) (ACUTE ONLY): 16 min   Charges:   PT Evaluation $PT Eval Moderate Complexity: 1 Mod     PT G Codes:        Leighton Ruff, PT, DPT  Acute Rehabilitation Services  Pager: [phone]   Rudean Hitt 05/26/2017, 1:52 PM

## 2017-05-28 LAB — CULTURE, BLOOD (ROUTINE X 2)
CULTURE: NO GROWTH
Culture: NO GROWTH
SPECIAL REQUESTS: ADEQUATE
SPECIAL REQUESTS: ADEQUATE

## 2017-06-12 ENCOUNTER — Ambulatory Visit: Payer: Self-pay | Admitting: Internal Medicine

## 2017-07-14 ENCOUNTER — Encounter: Payer: Self-pay | Admitting: Internal Medicine

## 2017-07-14 ENCOUNTER — Ambulatory Visit (INDEPENDENT_AMBULATORY_CARE_PROVIDER_SITE_OTHER): Payer: Medicare Other | Admitting: Internal Medicine

## 2017-07-14 ENCOUNTER — Encounter

## 2017-07-14 VITALS — BP 106/70 | HR 103 | Ht 61.0 in

## 2017-07-14 DIAGNOSIS — I4819 Other persistent atrial fibrillation: Secondary | ICD-10-CM

## 2017-07-14 DIAGNOSIS — I10 Essential (primary) hypertension: Secondary | ICD-10-CM

## 2017-07-14 DIAGNOSIS — I509 Heart failure, unspecified: Secondary | ICD-10-CM

## 2017-07-14 DIAGNOSIS — I481 Persistent atrial fibrillation: Secondary | ICD-10-CM | POA: Diagnosis not present

## 2017-07-14 NOTE — Progress Notes (Signed)
OFFICE NOTE  Chief Complaint:  Follow-up A. fib  Primary Care Physician: Patient, No Pcp Per  HPI:  [name] is a 84 y.o. female with a past medial history significant for PMR, hypertension, diabetes and diastolic congestive heart failure.  She had an echo in 2012 which showed normal systolic function, but it has not been repeated.  Recently she was hospitalized for cystitis/UTI in December at the time she was noted to have an irregular tachycardia suggestive of A. fib versus a junctional rhythm.  It was noted that this was similar to her prior EKG several months ago and "not related to her presentation" therefore cardiology follow-up was recommended and they suggested starting low-dose aspirin.  Today she is here for that cardiology follow-up.  EKG clearly shows atrial fibrillation with controlled ventricular response at 95-personally reviewed.  She is asymptomatic with this, is not clear when the onset of her A. fib was however it sounds like it may have been several months ago.  Since he was discharged from the hospital on December eighth, 2018, he had another episode of UTI which required a visit to the emergency department on January 3.  She denies chest pain or worsening shortness of breath.  Given her age of 61, female gender, hypertension, diabetes, and history of diastolic congestive heart failure, her CHADSVASC score is 6.  This would suggest a very high risk of stroke.  In discussion with her granddaughter who accompanied her today, she says that family is nearby and helps arrange her medications.  Although the patient lives alone and does use a cane when she is out of the house, she gets around fairly well and is not had a fall in more than a year.  Cognition is generally pretty good without any significant memory problems.  Family history indicates most of her relatives died of cancer old age but no significant heart disease.  07/14/2017  [name] returns today for follow-up  with her granddaughter.  She is in a wheelchair.  Unfortunately, later on in the day after I saw her this past January she had a fall at home and fractured her femur.  This required surgery and she has been at [facility] since then.  She has been unable to improve ambulation or return to her independent living.  She had significant weight loss although recently has improved some of her weight.  She remains in A. fib which is persistent and is not a candidate for cardioversion.  Her chads vas score was 6 as mentioned and I have her on Eliquis.  I revisited her Eliquis dosing requirement today.  Her weight is 62 kg, her creatinine was 0.75 and age of 42.  She meets only 1 of the 3 criteria to dose adjust therefore I recommend she remain on the 5 mg twice daily dose until which point her creatinine worsens or weight drops below 60 kg.  Her A. fib rate today is 103.  She is borderline hypotensive with blood pressure 106/70.  She is on amlodipine and metoprolol.  She has no complaints of chest pain or shortness of breath.  PMHx:  Past Medical History:  Diagnosis Date  . Abnormality of gait   . Anemia, unspecified   . Anxiety state, unspecified   . Arthritis    BACK , NECK, AND JOINTS  . 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 CHF (congestive heart failure) ([place])   . Chronic cystitis   . Chronic steroid use   .  COPD (chronic obstructive pulmonary disease) ([facility])    no inhalers used   . Depressive disorder, not elsewhere classified   . Dermatophytosis of nail   . Diabetic peripheral neuropathy ([place])   . Diaphragmatic hernia without mention of obstruction or gangrene   . Hemoperitoneum (nontraumatic)   . Herpes zoster with other nervous system complications(053.19)   . History of TIA (transient ischemic attack) 2012--  NO RESIDUAL  . HTN (hypertension)   . 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 Insulin dependent diabetes mellitus ([place])   . Macular degeneration of both eyes   .  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 Osteoporosis   . Osteoporosis, unspecified   . Other and unspecified hyperlipidemia   . Personality change due to conditions classified elsewhere   . PONV (postoperative nausea and vomiting)    in past, none recently  . 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 Temporal arteritis ([place])   . Type I (juvenile type) diabetes mellitus without mention of complication, uncontrolled   . Unspecified essential hypertension   . Unspecified late effects of cerebrovascular disease   . Urinary frequency     Past Surgical History:  Procedure Laterality Date  . ANTERIOR CERVICAL DECOMP/DISCECTOMY FUSION  10-12-2005   C5 -- C7  . CARPAL TUNNEL RELEASE Right 2009  (APPROX)  . CATARACT EXTRACTION W/ INTRAOCULAR LENS  IMPLANT, BILATERAL Right Oct 1992   Epes  . CATARACT EXTRACTION W/ INTRAOCULAR LENS IMPLANT Left 1993   E[facility]  . CRYOABLATION RIGHT RENAL MASS  03-16-2012   ONCOCYTOMA (POST OP 06-09-2012 PERCUTANEOUS DRAIN PLACEMENT FOR PERINEPHRIC ABSCESS)  . CYSTOSCOPY W/ URETERAL STENT PLACEMENT Right 07/10/2012   Procedure: CYSTOSCOPY WITH RIGHT RETROGRADE PYELOGRAM/URETERAL STENT PLACEMENT;  Surgeon: Malka So, MD;  Location: [facility];  Service: Urology;  Laterality: Right;  . ELBOW SURGERY Bilateral 2003  (APPROX)  . HIP ARTHROPLASTY Right 08/03/2012   Procedure: CEMENTED MONOPOLAR HIP;  Surgeon: Marybelle Killings, MD;  Location: [facility];  Service: Orthopedics;  Laterality: Right;  . LUMBAR LAMINECTOMY  11-19-2003   L3 -- L4  . ORIF FEMUR FRACTURE Right 03/15/2017   Procedure: OPEN REDUCTION INTERNAL FIXATION (ORIF) DISTAL FEMUR FRACTURE;  Surgeon: Shona Needles, MD;  Location: [facility];  Service: Orthopedics;  Laterality: Right;  . ORIF W/ PLATE RIGHT DISTAL RADIUS FX  06-18-2003  . POSTERIOR FUSION CERVICAL SPINE  02-10-2006   C5 -- C7  . TEMPORAL ARTERY BIOPSY / LIGATION    . TOTAL HIP ARTHROPLASTY Left  01-22-2005  . TOTAL KNEE ARTHROPLASTY Bilateral LEFT 08-08-2007;  RIGHT 05-08-2009    FAMHx:  Family History  Problem Relation Age of Onset  . Cancer Mother   . Cancer Father   . Cancer Brother   . Cancer Daughter   . Leukemia Unknown     SOCHx:   reports that she quit smoking about 34 years ago. Her smoking use included cigarettes. She has a 60.00 pack-year smoking history. She has never used smokeless tobacco. She reports that she does not drink alcohol or use drugs.  ALLERGIES:  Allergies  Allergen Reactions  . Allegra [Fexofenadine Hcl] Other (See Comments)    Unknown reaction  . Levaquin [Levofloxacin In D5w] Other (See Comments)    Unknown reaction  . Tussionex Pennkinetic Er [Hydrocod Polst-Cpm Polst Er] Nausea And Vomiting  . Codeine Nausea And Vomiting  . Morphine And Related Nausea And Vomiting  ROS: Pertinent items noted in HPI and remainder of comprehensive ROS otherwise negative.  HOME MEDS: Current Outpatient Medications on File Prior to Visit  Medication Sig Dispense Refill  . acetaminophen (TYLENOL) 325 MG tablet Take 2 tablets (650 mg total) by mouth every 6 (six) hours as needed for mild pain, moderate pain, fever or headache.    Marland Kitchen amLODipine (NORVASC) 5 MG tablet Take 1 tablet (5 mg total) by mouth daily.    Marland Kitchen apixaban (ELIQUIS) 5 MG TABS tablet Take 1 tablet (5 mg total) by mouth 2 (two) times daily.    . collagenase (SANTYL) ointment Apply 1 application topically daily. Apply a dime-sized thickness amount to entire wound bed daily    . feeding supplement, GLUCERNA SHAKE, (GLUCERNA SHAKE) LIQD Take 237 mLs by mouth 2 (two) times daily between meals. 237 mL 0  . ferrous sulfate 325 (65 FE) MG tablet Take 1 tablet (325 mg total) by mouth 2 (two) times daily with a meal.  3  . gabapentin (NEURONTIN) 300 MG capsule Take 1 capsule (300 mg total) by mouth 2 (two) times daily. Taking 1 tab in the am , and 2 tabs in the pm. (Patient taking differently: Take  300-600 mg by mouth See admin instructions. 300mg  in am, 600mg  in pm)    . glimepiride (AMARYL) 1 MG tablet Take 1 mg by mouth daily with breakfast.    . metoprolol tartrate (LOPRESSOR) 25 MG tablet Take 0.5 tablets (12.5 mg total) by mouth 2 (two) times daily. 12 tablet 1  . mirtazapine (REMERON) 15 MG tablet Take 15 mg by mouth at bedtime.    . Multiple Vitamins-Minerals (DECUBI-VITE PO) Take 1 capsule by mouth daily.    Marland Kitchen oxybutynin (DITROPAN) 5 MG tablet Take 1 tablet (5 mg total) by mouth 3 (three) times daily as needed for bladder spasms (Up to 3 times daily to control bladder spasms.). 90 tablet 1  . predniSONE (DELTASONE) 1 MG tablet Please start Prednisone 3mg  daily AFTER patient has completed the full weekly prednisone taper (20->15->10->5mg )    . ranitidine (ZANTAC) 150 MG tablet Take 150 mg by mouth daily.    Marland Kitchen senna (SENOKOT) 8.6 MG TABS tablet Take 1 tablet by mouth daily as needed (constipation).     No current facility-administered medications on file prior to visit.     LABS/IMAGING: No results found for this or any previous visit (from the past 48 hour(s)). No results found.  LIPID PANEL:    Component Value Date/Time   CHOL 131 09/04/2013 1508   TRIG 65 09/04/2013 1508   HDL 62 09/04/2013 1508   CHOLHDL 2.1 09/04/2013 1508   LDLCALC 56 09/04/2013 1508     WEIGHTS: Wt Readings from Last 3 Encounters:  05/25/17 176 lb (79.8 kg)  04/05/17 164 lb 10.9 oz (74.7 kg)  03/15/17 144 lb (65.3 kg)    VITALS: BP 106/70 (BP Location: Left Arm, Patient Position: Sitting, Cuff Size: Normal)   Pulse (!) 103   Ht 5\' 1"  (1.549 m)   BMI 33.25 kg/m   EXAM: General appearance: alert, appears stated age, no distress and Quiet, in a wheelchair Neck: no carotid bruit, no JVD and thyroid not enlarged, symmetric, no tenderness/mass/nodules Lungs: clear to auscultation bilaterally Heart: irregularly irregular rhythm Abdomen: soft, non-tender; bowel sounds normal; no masses,  no  organomegaly Extremities: extremities normal, atraumatic, no cyanosis or edema Pulses: 2+ and symmetric Skin: Skin color, texture, turgor normal. No rashes or lesions Neurologic: Grossly normal Psych: Pleasant  EKG: A. fib with RVR at 103-personally reviewed  ASSESSMENT: 1. Persistent atrial fibrillation 2. CHADSVASC score of 6 -on Eliquis 5 mg twice daily 3. Hypertension 4. Type 2 diabetes 5. History of diastolic congestive heart failure 6. Recurrent UTI 7. Recent femur fracture-minimally ambulatory  PLAN: 1.   Ms. Owens is appropriately anticoagulated on Eliquis 5 mg twice daily given the parameters of weight, age and creatinine.  She is in persistent A. fib and rate control could be improved.  I recommend increasing metoprolol 25 mg twice daily.  We will decrease her amlodipine to 2.5 mg daily to allow blood pressure room for this.  Unfortunately she had recent femur fracture and is been a long-term resident of [facility].  Her granddaughter did not feel that she could likely get back to her apartment.  Follow-up in 6 months.  Pixie Casino, MD, [facility], [place] Director of the Advanced Lipid Disorders &  Cardiovascular Risk Reduction Clinic Diplomate of the American Board of Clinical Lipidology Attending Cardiologist  Direct Dial: [phone]  Fax: [phone]  Website:  [email or web address] 07/14/2017, 9:06 AM

## 2017-07-14 NOTE — Patient Instructions (Signed)
Medication Instructions:   DECREASE amlodipine to 2.5mg  daily INCREASE metoprolol to 25mg  twice daily CONTINUE eliquis 5mg  twice daily  Labwork:  NONE  Testing/Procedures:  NONE  Follow-Up:  Your physician wants you to follow-up in: 6 months with Dr. Debara Pickett. You will receive a reminder letter in the mail two months in advance. If you don't receive a letter, please call our office to schedule the follow-up appointment.   If you need a refill on your cardiac medications before your next appointment, please call your pharmacy.  Any Other Special Instructions Will Be Listed Below (If Applicable).

## 2018-04-07 DEATH — deceased

## 2019-01-09 IMAGING — DX DG CHEST 2V
2 series · 2 of 2 positions shown · non-contrast
Comparison: 09/11/2016

CLINICAL DATA: Shortness of breath on exertion

EXAM:
CHEST  2 VIEW

[chest lat]
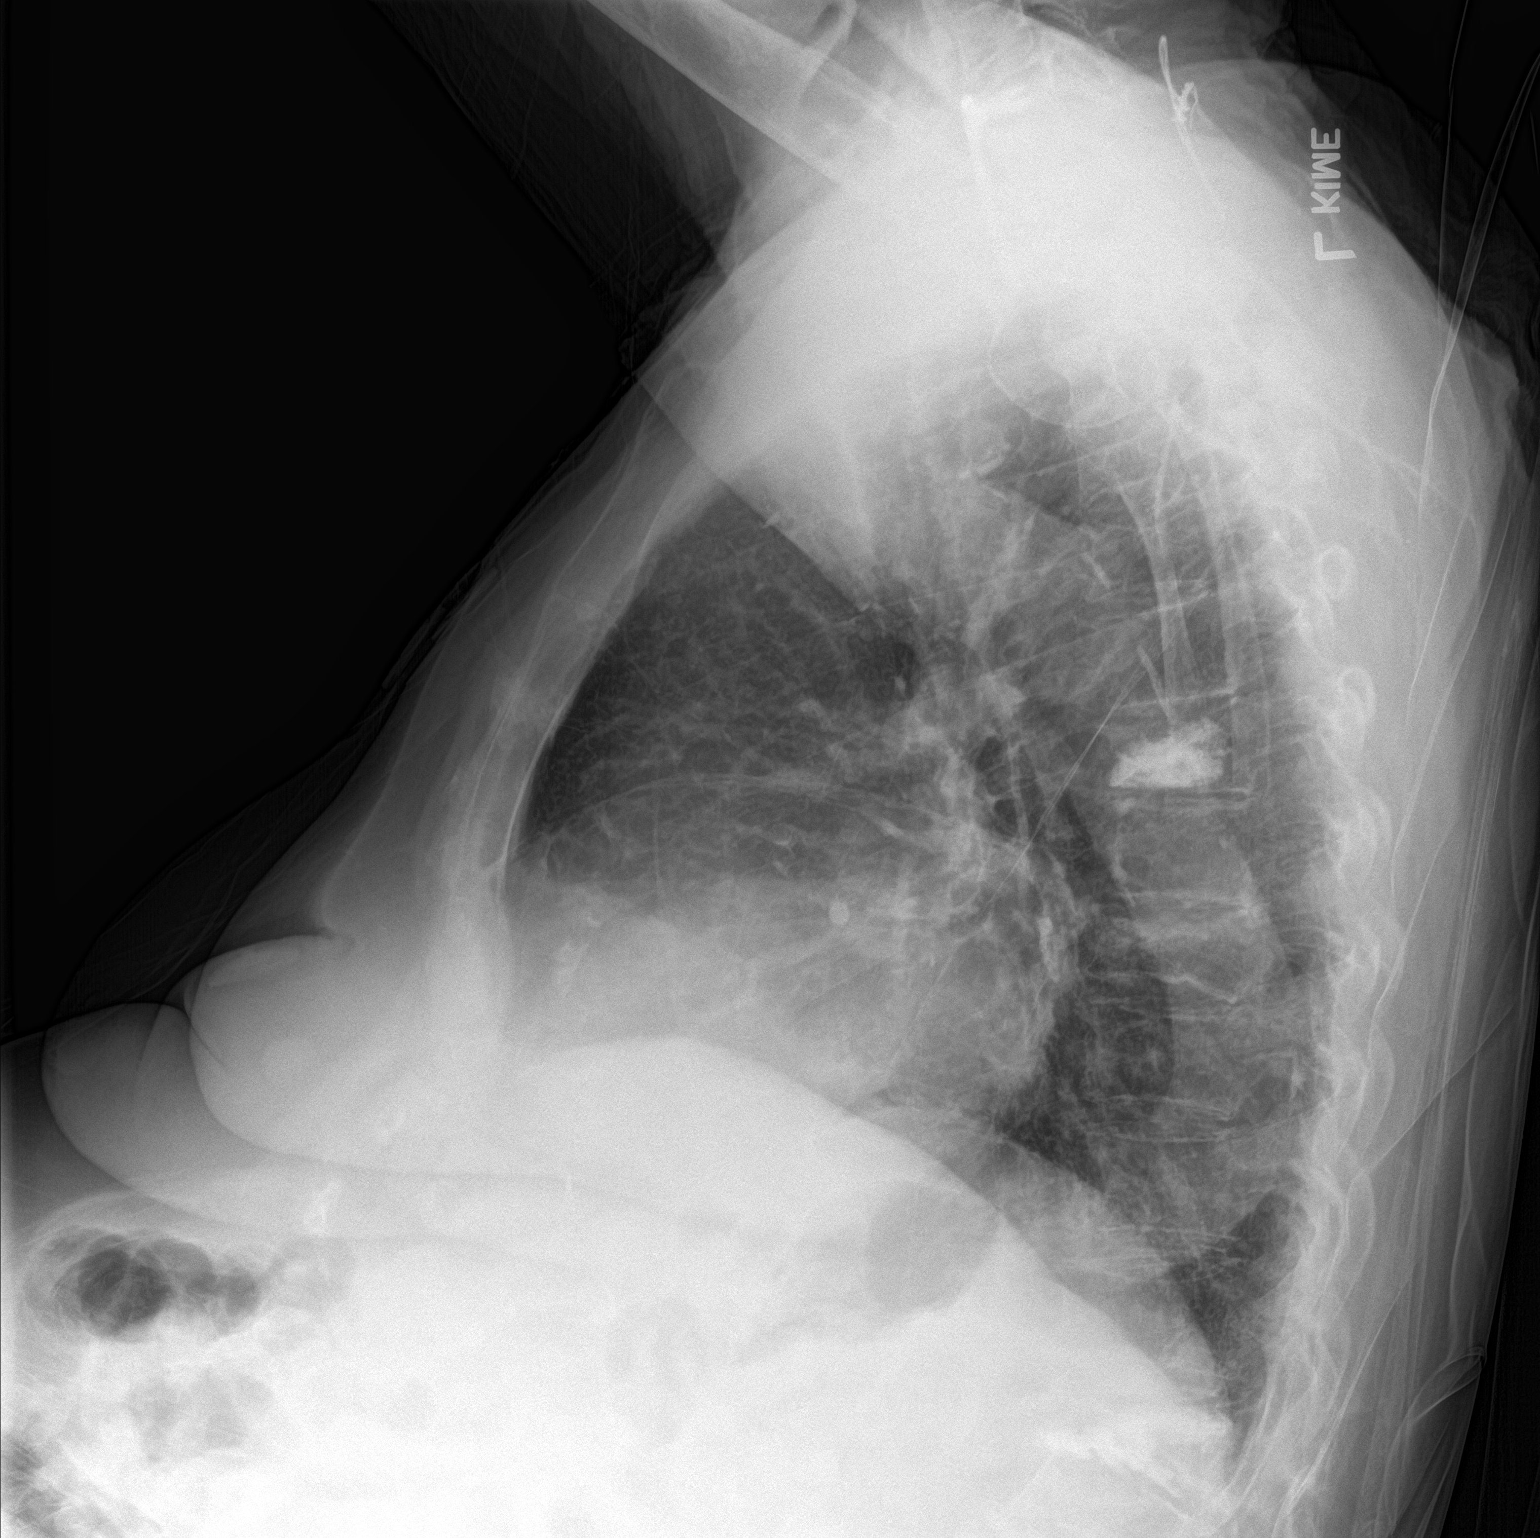

[chest ap]
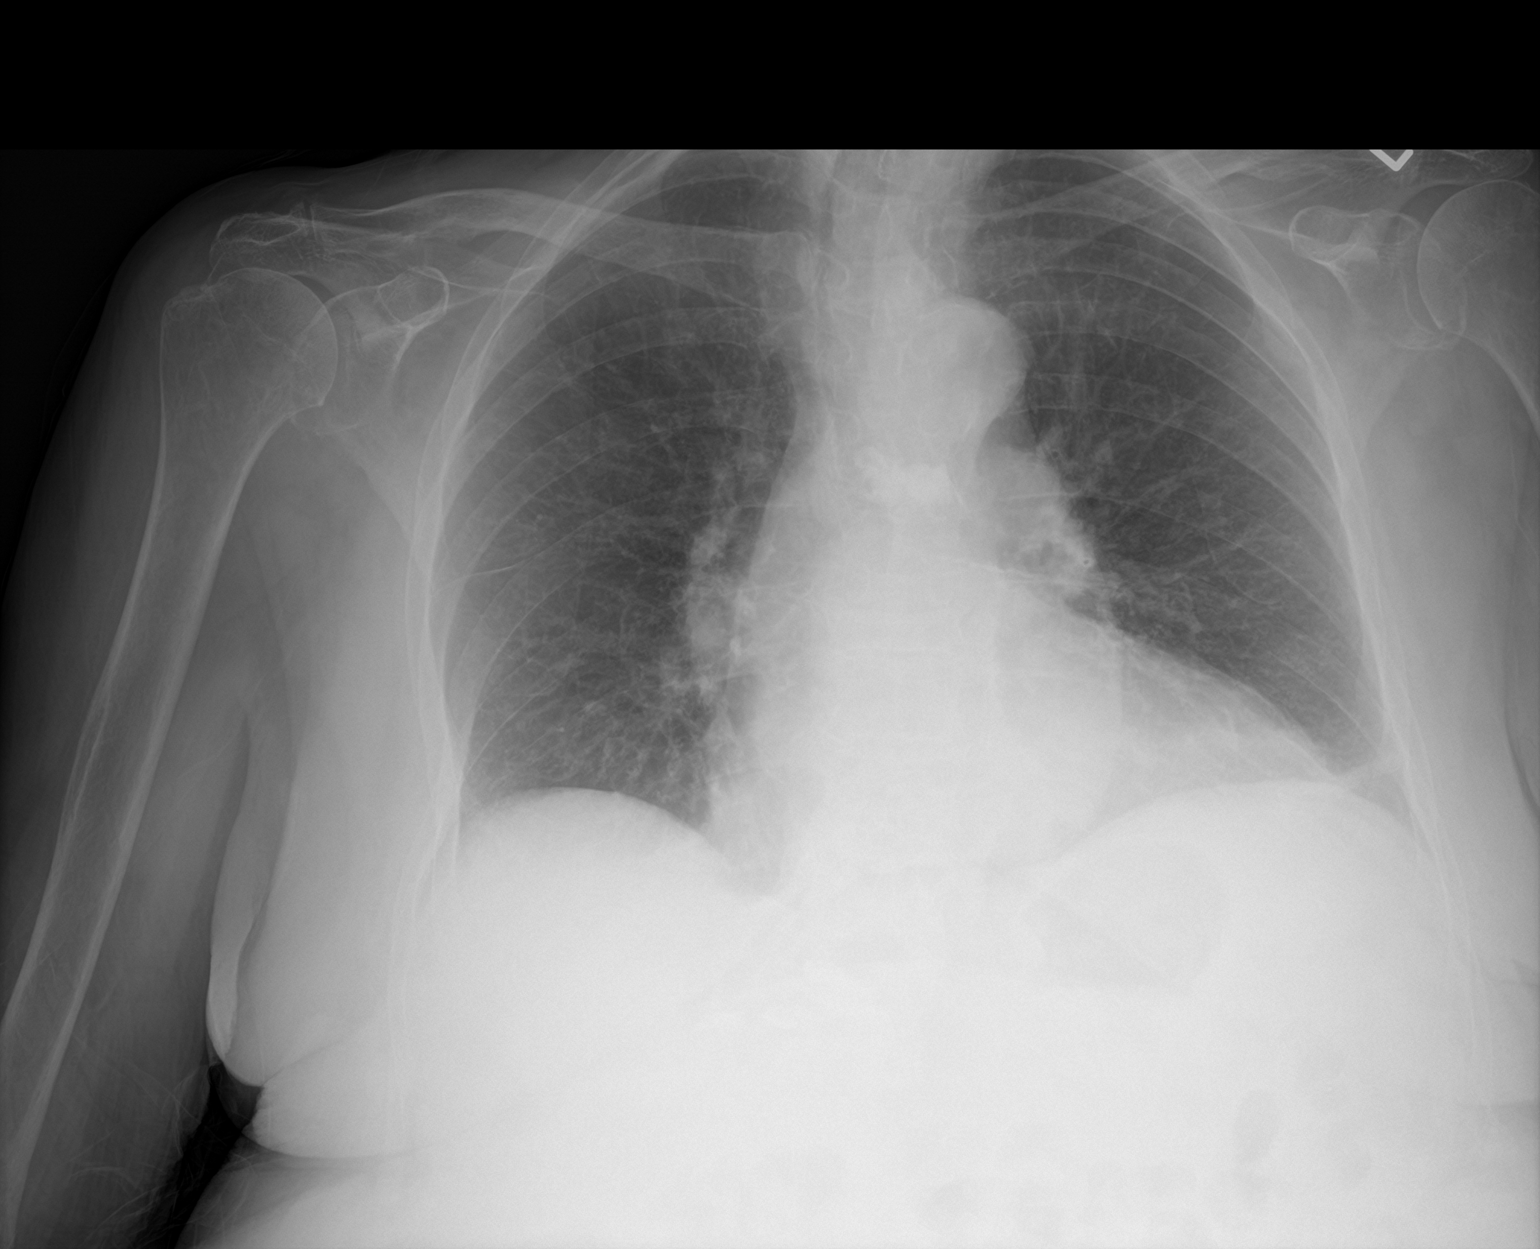

[2 of 2 positions shown; findings below may reference images not displayed]

FINDINGS: Cardiac shadow is again enlarged. Aortic calcifications are again
seen. The lungs are well aerated bilaterally minimal left basilar
scarring. Changes of prior vertebral augmentation are seen. Stable
compression deformity is noted at T8.
IMPRESSION: No active cardiopulmonary disease.

## 2019-05-27 IMAGING — DX DG FEMUR 2+V*R*
4 series · 4 of 4 positions shown · non-contrast
Comparison: 03/15/2017

CLINICAL DATA: History of recent femoral fracture with fixation and
persistent pain, subsequent encounter

EXAM:
RIGHT FEMUR 2 VIEWS

[femur ap (1 of 2)]
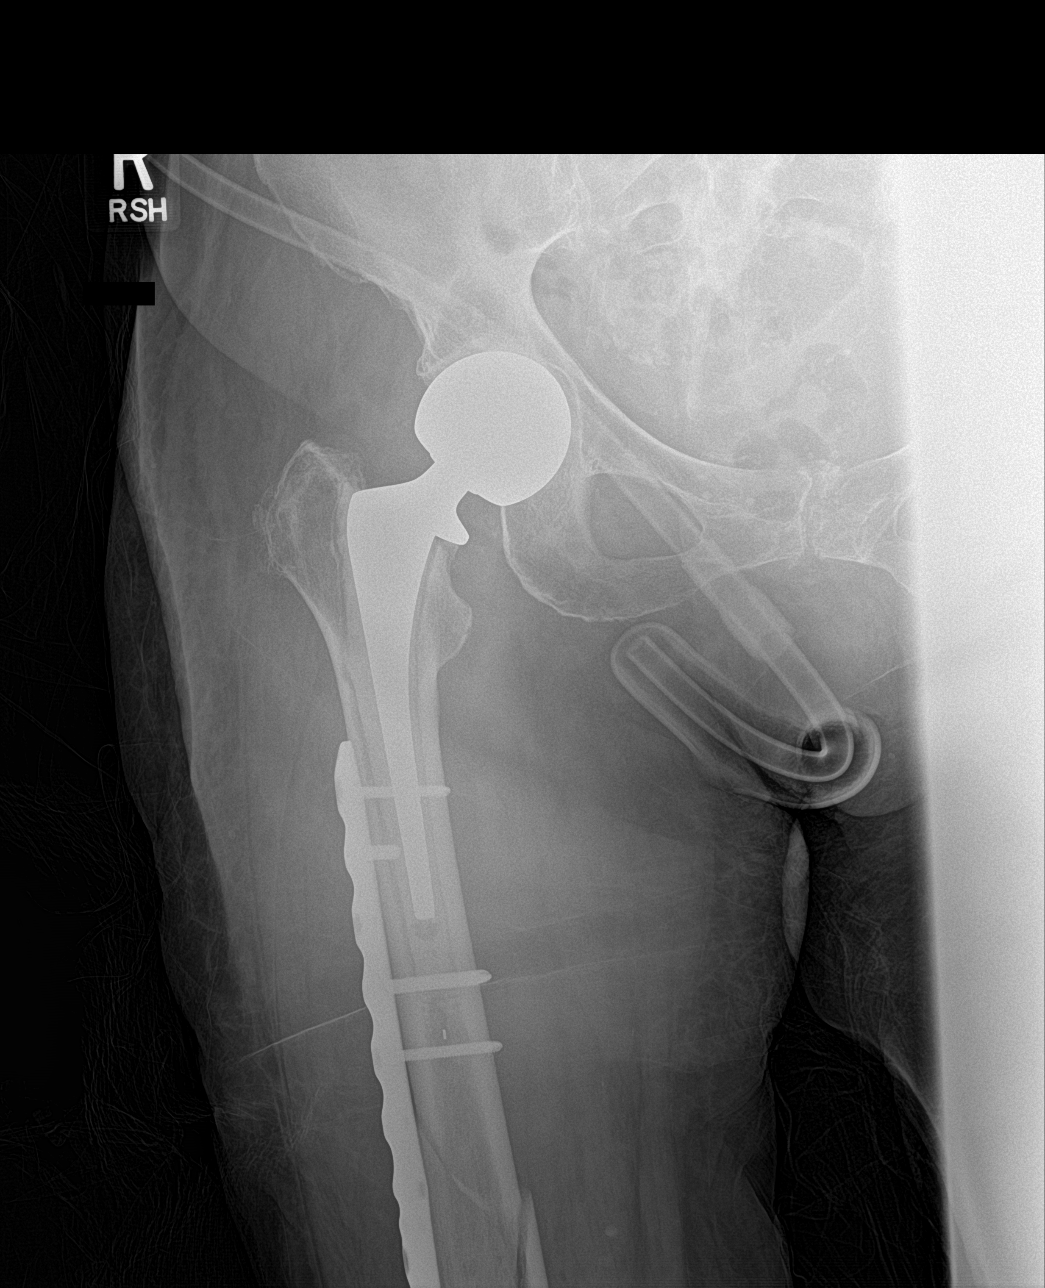

[femur ap (2 of 2)]
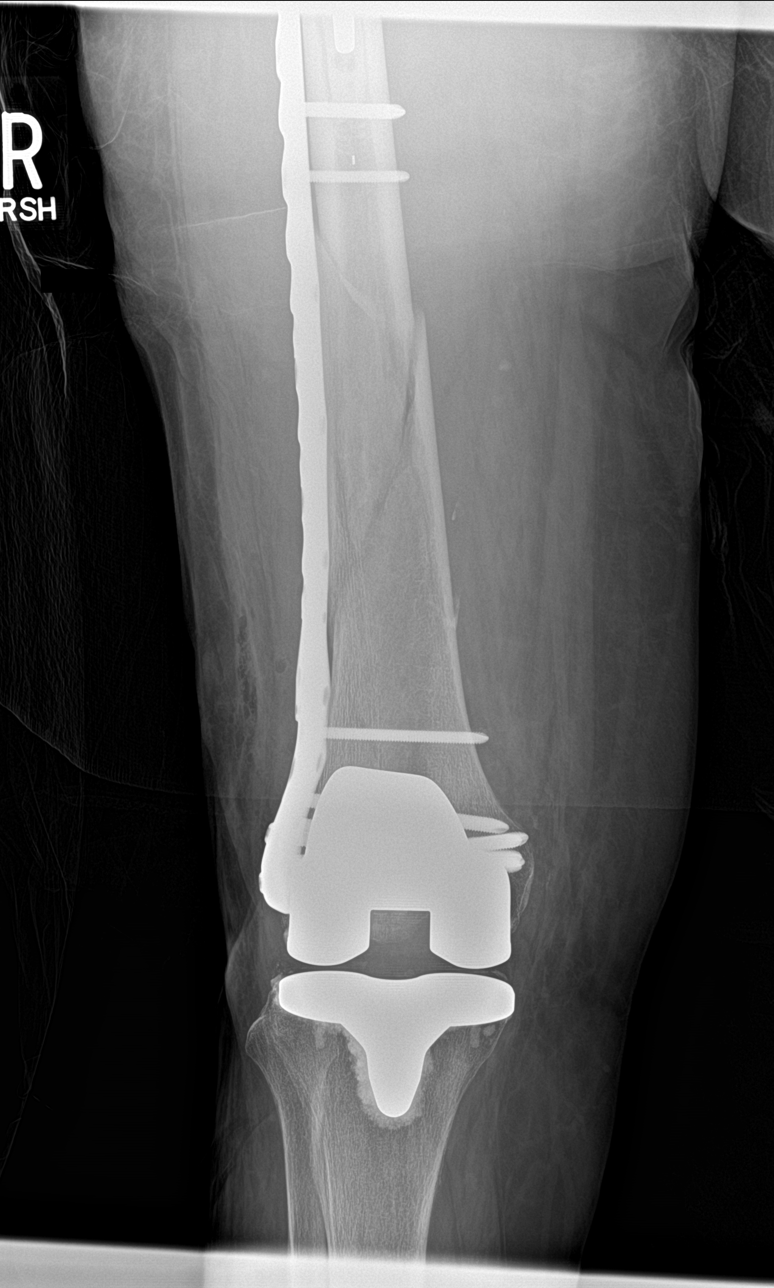

[femur lat (1 of 2)]
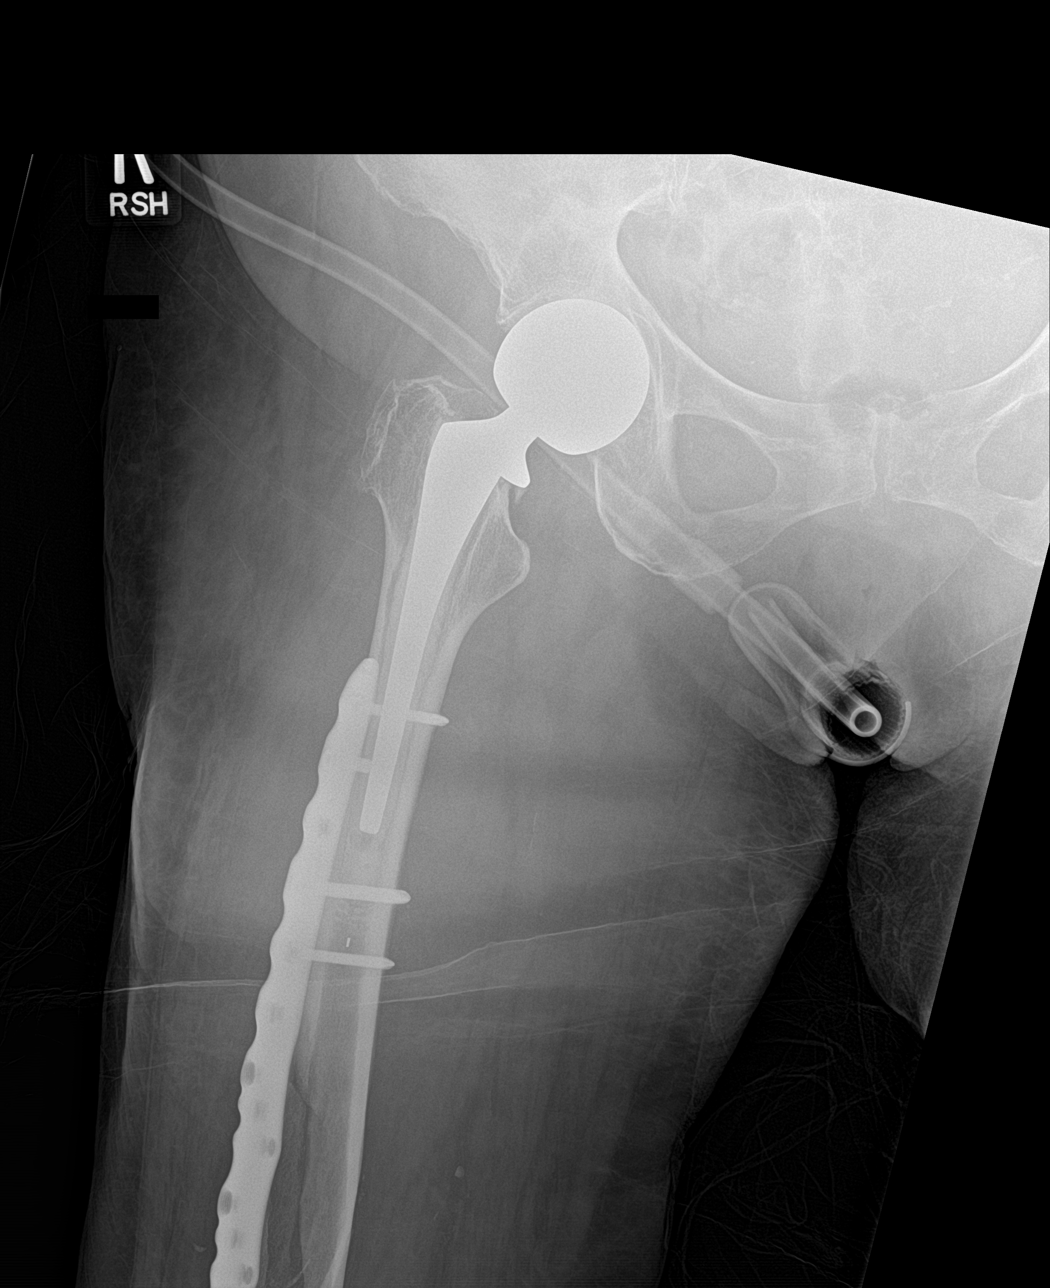

[femur lat (2 of 2)]
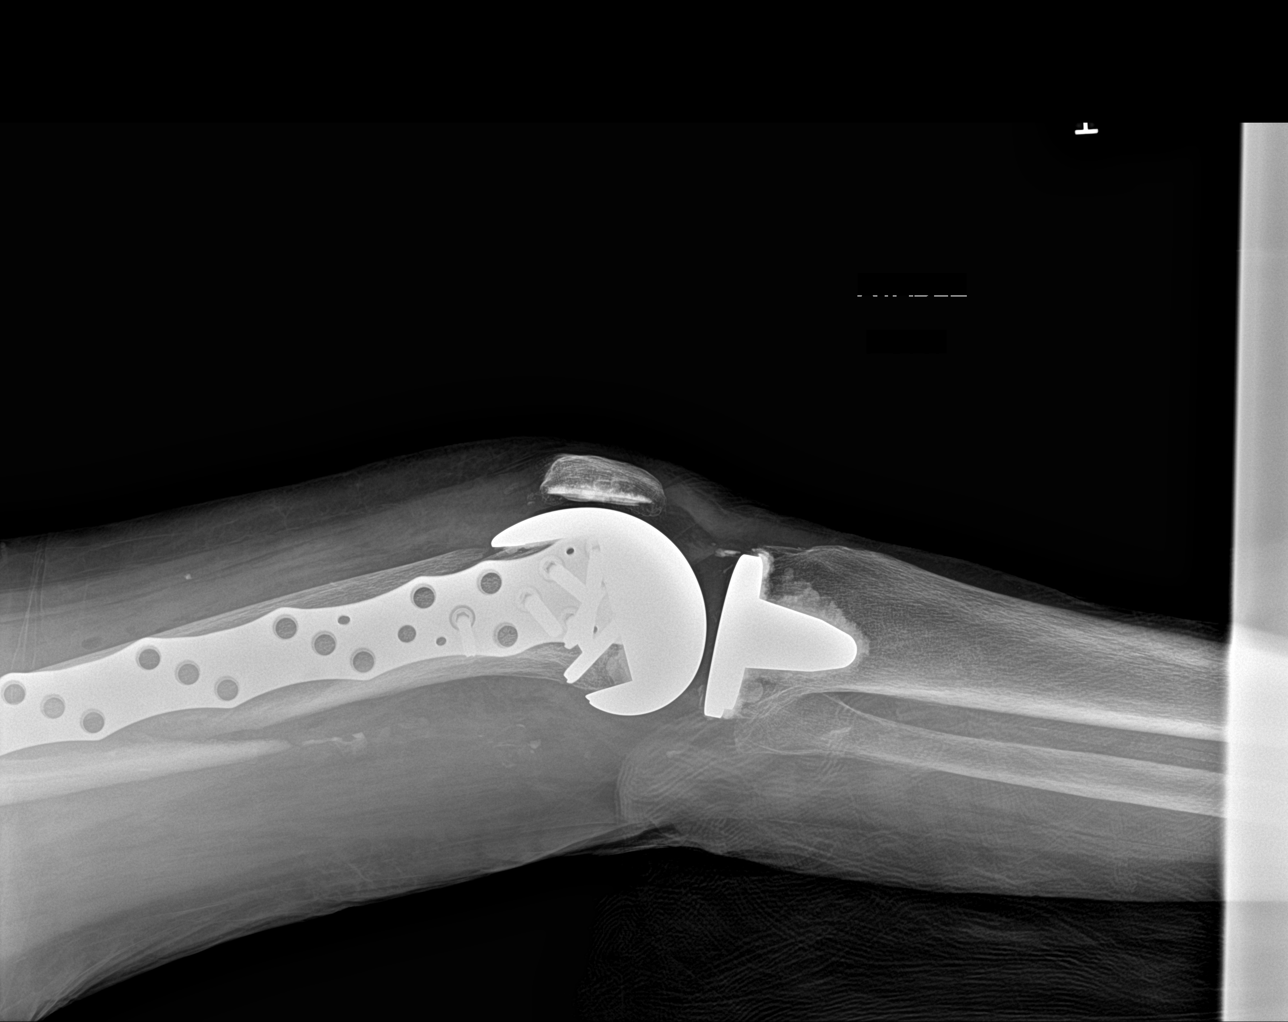

[4 of 4 positions shown; findings below may reference images not displayed]

FINDINGS: There again noted changes consistent with right hip replacement as
well as a fixation sideplate along the mid to distal right femur
with multiple fixation screws. Right knee replacement is noted as
well. The fracture fragments are similar in appearance to that seen
on the prior exam. No new focal abnormality is noted.
IMPRESSION: Stable appearance of right femoral fracture with fixation plate.
